# Patient Record
Sex: Male | Born: 1955 | Race: White | Hispanic: No | Marital: Married | Smoking: Former smoker
Health system: Southern US, Community
[De-identification: ages and names within clinical notes are randomized; demographics above are authoritative.]

## PROBLEM LIST (undated history)

## (undated) DIAGNOSIS — C641 Malignant neoplasm of right kidney, except renal pelvis: Secondary | ICD-10-CM

## (undated) DIAGNOSIS — I10 Essential (primary) hypertension: Secondary | ICD-10-CM

## (undated) DIAGNOSIS — E785 Hyperlipidemia, unspecified: Secondary | ICD-10-CM

## (undated) DIAGNOSIS — I214 Non-ST elevation (NSTEMI) myocardial infarction: Secondary | ICD-10-CM

## (undated) DIAGNOSIS — K219 Gastro-esophageal reflux disease without esophagitis: Secondary | ICD-10-CM

## (undated) DIAGNOSIS — M1990 Unspecified osteoarthritis, unspecified site: Secondary | ICD-10-CM

## (undated) DIAGNOSIS — I2510 Atherosclerotic heart disease of native coronary artery without angina pectoris: Secondary | ICD-10-CM

## (undated) HISTORY — DX: Essential (primary) hypertension: I10

## (undated) HISTORY — DX: Atherosclerotic heart disease of native coronary artery without angina pectoris: I25.10

## (undated) HISTORY — DX: Hyperlipidemia, unspecified: E78.5

## (undated) HISTORY — PX: BACK SURGERY: SHX140

## (undated) HISTORY — DX: Non-ST elevation (NSTEMI) myocardial infarction: I21.4

## (undated) HISTORY — DX: Malignant neoplasm of right kidney, except renal pelvis: C64.1

## (undated) HISTORY — PX: TONSILLECTOMY: SUR1361

---

## 2007-08-27 ENCOUNTER — Ambulatory Visit (HOSPITAL_COMMUNITY): Admission: RE | Admit: 2007-08-27 | Discharge: 2007-08-27 | Payer: Self-pay | Admitting: Unknown Physician Specialty

## 2011-11-20 ENCOUNTER — Ambulatory Visit (HOSPITAL_COMMUNITY)
Admission: RE | Admit: 2011-11-20 | Discharge: 2011-11-20 | Disposition: A | Discharge status: Home / Self Care | Source: Ambulatory Visit | Attending: Urology | Admitting: Urology

## 2011-11-20 ENCOUNTER — Other Ambulatory Visit (HOSPITAL_COMMUNITY): Payer: Self-pay | Admitting: Urology

## 2011-11-20 DIAGNOSIS — D4959 Neoplasm of unspecified behavior of other genitourinary organ: Secondary | ICD-10-CM | POA: Insufficient documentation

## 2011-11-20 DIAGNOSIS — I10 Essential (primary) hypertension: Secondary | ICD-10-CM | POA: Insufficient documentation

## 2011-11-25 ENCOUNTER — Other Ambulatory Visit: Payer: Self-pay | Admitting: Urology

## 2012-01-06 ENCOUNTER — Encounter (HOSPITAL_COMMUNITY): Payer: Self-pay | Admitting: Pharmacy Technician

## 2012-01-08 ENCOUNTER — Encounter (HOSPITAL_COMMUNITY): Payer: Self-pay

## 2012-01-08 ENCOUNTER — Encounter (HOSPITAL_COMMUNITY)
Admission: RE | Admit: 2012-01-08 | Discharge: 2012-01-08 | Disposition: A | Discharge status: Home / Self Care | Source: Ambulatory Visit | Attending: Urology | Admitting: Urology

## 2012-01-08 HISTORY — DX: Unspecified osteoarthritis, unspecified site: M19.90

## 2012-01-08 HISTORY — DX: Gastro-esophageal reflux disease without esophagitis: K21.9

## 2012-01-08 LAB — BASIC METABOLIC PANEL
CO2: 26 mEq/L (ref 19–32)
Chloride: 102 mEq/L (ref 96–112)
Creatinine, Ser: 0.74 mg/dL (ref 0.50–1.35)
Glucose, Bld: 90 mg/dL (ref 70–99)
Potassium: 4.2 mEq/L (ref 3.5–5.1)

## 2012-01-08 LAB — CBC
MCHC: 34.1 g/dL (ref 30.0–36.0)
Platelets: 213 10*3/uL (ref 150–400)
RBC: 4.77 MIL/uL (ref 4.22–5.81)
RDW: 13.7 % (ref 11.5–15.5)
WBC: 8.3 10*3/uL (ref 4.0–10.5)

## 2012-01-08 LAB — SURGICAL PCR SCREEN: MRSA, PCR: NEGATIVE

## 2012-01-08 MED ORDER — CEFAZOLIN SODIUM 1-5 GM-% IV SOLN: 1.0000 g | INTRAVENOUS | Status: DC

## 2012-01-08 NOTE — Patient Instructions (Signed)
[address] MIA WINTHROP  01/08/2012   Your procedure is scheduled on:  01/14/12 [id number]  Report to [facility] at 0515 AM.  Call this number if you have problems the morning of surgery: [phone]   Remember:   Do not eat food:After Midnight.  May have clear liquids:until Midnight .  Marland Kitchen  Take these medicines the morning of surgery with A SIP OF WATER:    Do not wear jewelry,   Do not wear lotions, powders, or perfumes.   .  Do not bring valuables to the hospital.  Contacts, dentures or bridgework may not be worn into surgery.  Leave suitcase in the car. After surgery it may be brought to your room.  For patients admitted to the hospital, checkout time is 11:00 AM the day of discharge.      Special Instructions: CHG Shower Use Special Wash: 1/2 bottle night before surgery and 1/2 bottle morning of surgery. Shower chin to toes with CHG.  Wash face and private parts with regular soap.     Please read over the following fact sheets that you were given: MRSA Information, coughing and deep breathing exercises, leg exercises

## 2012-01-14 ENCOUNTER — Inpatient Hospital Stay (HOSPITAL_COMMUNITY): Admitting: Certified Registered Nurse Anesthetist

## 2012-01-14 ENCOUNTER — Encounter (HOSPITAL_COMMUNITY)
Admission: RE | Disposition: A | Discharge status: Home / Self Care | Payer: Self-pay | Source: Ambulatory Visit | Attending: Urology

## 2012-01-14 ENCOUNTER — Inpatient Hospital Stay (HOSPITAL_COMMUNITY)
Admission: RE | Admit: 2012-01-14 | Discharge: 2012-01-19 | DRG: 658 | Disposition: A | Discharge status: Home / Self Care | Source: Ambulatory Visit | Attending: Urology | Admitting: Urology

## 2012-01-14 ENCOUNTER — Encounter (HOSPITAL_COMMUNITY): Payer: Self-pay

## 2012-01-14 ENCOUNTER — Encounter (HOSPITAL_COMMUNITY): Payer: Self-pay | Admitting: Certified Registered Nurse Anesthetist

## 2012-01-14 ENCOUNTER — Inpatient Hospital Stay (HOSPITAL_COMMUNITY)

## 2012-01-14 DIAGNOSIS — Z01812 Encounter for preprocedural laboratory examination: Secondary | ICD-10-CM

## 2012-01-14 DIAGNOSIS — I10 Essential (primary) hypertension: Secondary | ICD-10-CM | POA: Diagnosis present

## 2012-01-14 DIAGNOSIS — K047 Periapical abscess without sinus: Secondary | ICD-10-CM | POA: Diagnosis present

## 2012-01-14 DIAGNOSIS — C649 Malignant neoplasm of unspecified kidney, except renal pelvis: Principal | ICD-10-CM | POA: Diagnosis present

## 2012-01-14 DIAGNOSIS — K219 Gastro-esophageal reflux disease without esophagitis: Secondary | ICD-10-CM | POA: Diagnosis present

## 2012-01-14 HISTORY — PX: ROBOT ASSISTED LAPAROSCOPIC NEPHRECTOMY: SHX5140

## 2012-01-14 LAB — TYPE AND SCREEN: Antibody Screen: NEGATIVE

## 2012-01-14 LAB — BASIC METABOLIC PANEL
BUN: 13 mg/dL (ref 6–23)
Calcium: 8.9 mg/dL (ref 8.4–10.5)
Creatinine, Ser: 0.96 mg/dL (ref 0.50–1.35)
GFR calc Af Amer: >90 mL/min (ref >90)
GFR calc non Af Amer: >90 mL/min (ref >90)

## 2012-01-14 LAB — ABO/RH: ABO/RH(D): A POS

## 2012-01-14 SURGERY — ROBOTIC ASSISTED LAPAROSCOPIC NEPHRECTOMY
Anesthesia: General | Site: Bladder | Laterality: Right | Wound class: Clean

## 2012-01-14 MED ORDER — GLYCOPYRROLATE 0.2 MG/ML IJ SOLN
INTRAMUSCULAR | Status: DC | PRN
  Administered 2012-01-14: 0.6 mg via INTRAVENOUS

## 2012-01-14 MED ORDER — ACETAMINOPHEN 10 MG/ML IV SOLN
1000.0000 mg | Freq: Four times a day (QID) | INTRAVENOUS | Status: DC
  Administered 2012-01-14: 1000 mg via INTRAVENOUS

## 2012-01-14 MED ORDER — KETOROLAC TROMETHAMINE 30 MG/ML IJ SOLN: 15.0000 mg | Freq: Once | INTRAMUSCULAR | Status: DC | PRN

## 2012-01-14 MED ORDER — PROPOFOL 10 MG/ML IV BOLUS
INTRAVENOUS | Status: DC | PRN
  Administered 2012-01-14: 200 mg via INTRAVENOUS

## 2012-01-14 MED ORDER — HYDRALAZINE HCL 20 MG/ML IJ SOLN
INTRAMUSCULAR | Status: DC | PRN
  Administered 2012-01-14 (×2): 4 mg via INTRAVENOUS

## 2012-01-14 MED ORDER — CEFAZOLIN SODIUM 1-5 GM-% IV SOLN
1.0000 g | Freq: Three times a day (TID) | INTRAVENOUS | Status: AC
  Administered 2012-01-14 – 2012-01-15 (×2): 1 g via INTRAVENOUS
  Filled 2012-01-14 (×2): qty 50

## 2012-01-14 MED ORDER — LIDOCAINE HCL (CARDIAC) 20 MG/ML IV SOLN
INTRAVENOUS | Status: DC | PRN
  Administered 2012-01-14: 60 mg via INTRAVENOUS

## 2012-01-14 MED ORDER — LACTATED RINGERS IV SOLN
INTRAVENOUS | Status: DC | PRN
  Administered 2012-01-14 (×4): via INTRAVENOUS

## 2012-01-14 MED ORDER — SODIUM CHLORIDE 0.9 % IV BOLUS (SEPSIS)
1000.0000 mL | Freq: Once | INTRAVENOUS | Status: AC
  Administered 2012-01-14: 1000 mL via INTRAVENOUS

## 2012-01-14 MED ORDER — DEXAMETHASONE SODIUM PHOSPHATE 10 MG/ML IJ SOLN
INTRAMUSCULAR | Status: DC | PRN
  Administered 2012-01-14: 10 mg via INTRAVENOUS

## 2012-01-14 MED ORDER — NEOSTIGMINE METHYLSULFATE 1 MG/ML IJ SOLN
INTRAMUSCULAR | Status: DC | PRN
  Administered 2012-01-14: 4 mg via INTRAVENOUS

## 2012-01-14 MED ORDER — ACETAMINOPHEN 10 MG/ML IV SOLN
1000.0000 mg | Freq: Four times a day (QID) | INTRAVENOUS | Status: AC
  Administered 2012-01-14 – 2012-01-15 (×4): 1000 mg via INTRAVENOUS
  Filled 2012-01-14 (×4): qty 100

## 2012-01-14 MED ORDER — SUCCINYLCHOLINE CHLORIDE 20 MG/ML IJ SOLN
INTRAMUSCULAR | Status: DC | PRN
  Administered 2012-01-14: 100 mg via INTRAVENOUS

## 2012-01-14 MED ORDER — IOHEXOL 300 MG/ML  SOLN
INTRAMUSCULAR | Status: DC | PRN
  Administered 2012-01-14: 6 mL

## 2012-01-14 MED ORDER — PROMETHAZINE HCL 25 MG/ML IJ SOLN: 6.2500 mg | INTRAMUSCULAR | Status: DC | PRN

## 2012-01-14 MED ORDER — KCL IN DEXTROSE-NACL 10-5-0.45 MEQ/L-%-% IV SOLN
INTRAVENOUS | Status: DC
  Administered 2012-01-14: 1000 mL via INTRAVENOUS
  Filled 2012-01-14 (×3): qty 1000

## 2012-01-14 MED ORDER — ONDANSETRON HCL 4 MG/2ML IJ SOLN
INTRAMUSCULAR | Status: DC | PRN
  Administered 2012-01-14: 4 mg via INTRAVENOUS

## 2012-01-14 MED ORDER — HYOSCYAMINE SULFATE 0.125 MG PO TABS
0.1250 mg | ORAL_TABLET | ORAL | Status: DC | PRN
  Filled 2012-01-14: qty 1

## 2012-01-14 MED ORDER — HYDROCODONE-ACETAMINOPHEN 5-325 MG PO TABS
1.0000 | ORAL_TABLET | ORAL | Status: DC | PRN
  Administered 2012-01-16 – 2012-01-19 (×7): 2 via ORAL
  Filled 2012-01-14 (×7): qty 2

## 2012-01-14 MED ORDER — LABETALOL HCL 5 MG/ML IV SOLN
5.0000 mg | INTRAVENOUS | Status: DC | PRN
  Administered 2012-01-14 (×3): 5 mg via INTRAVENOUS
  Filled 2012-01-14: qty 4

## 2012-01-14 MED ORDER — HYDROMORPHONE HCL PF 1 MG/ML IJ SOLN
0.2500 mg | INTRAMUSCULAR | Status: DC | PRN
  Administered 2012-01-14 (×2): 0.25 mg via INTRAVENOUS
  Administered 2012-01-14: 0.5 mg via INTRAVENOUS
  Administered 2012-01-14: 0.25 mg via INTRAVENOUS
  Administered 2012-01-14: 0.5 mg via INTRAVENOUS

## 2012-01-14 MED ORDER — HYDROMORPHONE HCL PF 1 MG/ML IJ SOLN
INTRAMUSCULAR | Status: DC | PRN
  Administered 2012-01-14 (×2): 1 mg via INTRAVENOUS

## 2012-01-14 MED ORDER — MIDAZOLAM HCL 5 MG/5ML IJ SOLN
INTRAMUSCULAR | Status: DC | PRN
  Administered 2012-01-14: 2 mg via INTRAVENOUS

## 2012-01-14 MED ORDER — EPHEDRINE SULFATE 50 MG/ML IJ SOLN
INTRAMUSCULAR | Status: DC | PRN
  Administered 2012-01-14: 10 mg via INTRAVENOUS

## 2012-01-14 MED ORDER — BELLADONNA ALKALOIDS-OPIUM 16.2-60 MG RE SUPP
1.0000 | Freq: Four times a day (QID) | RECTAL | Status: DC | PRN
  Administered 2012-01-14: 1 via RECTAL

## 2012-01-14 MED ORDER — BUPIVACAINE LIPOSOME 1.3 % IJ SUSP
20.0000 mL | Freq: Once | INTRAMUSCULAR | Status: AC
  Administered 2012-01-14: 30 mL
  Filled 2012-01-14: qty 20

## 2012-01-14 MED ORDER — CEFAZOLIN SODIUM-DEXTROSE 2-3 GM-% IV SOLR
2.0000 g | Freq: Once | INTRAVENOUS | Status: AC
  Administered 2012-01-14 (×2): 2 g via INTRAVENOUS

## 2012-01-14 MED ORDER — PENICILLIN V POTASSIUM 500 MG PO TABS
500.0000 mg | ORAL_TABLET | Freq: Four times a day (QID) | ORAL | Status: DC
  Administered 2012-01-15 – 2012-01-19 (×17): 500 mg via ORAL
  Filled 2012-01-14 (×21): qty 1

## 2012-01-14 MED ORDER — MANNITOL 25 % IV SOLN
INTRAVENOUS | Status: DC | PRN
  Administered 2012-01-14 (×2): 12.5 g via INTRAVENOUS

## 2012-01-14 MED ORDER — FENTANYL CITRATE 0.05 MG/ML IJ SOLN
INTRAMUSCULAR | Status: DC | PRN
  Administered 2012-01-14: 100 ug via INTRAVENOUS
  Administered 2012-01-14: 50 ug via INTRAVENOUS
  Administered 2012-01-14 (×3): 100 ug via INTRAVENOUS
  Administered 2012-01-14: 50 ug via INTRAVENOUS

## 2012-01-14 MED ORDER — MORPHINE SULFATE 2 MG/ML IJ SOLN
2.0000 mg | INTRAMUSCULAR | Status: DC | PRN
Start: 2012-01-14 — End: 2012-01-19
  Administered 2012-01-14 – 2012-01-16 (×13): 2 mg via INTRAVENOUS
  Filled 2012-01-14 (×14): qty 1

## 2012-01-14 MED ORDER — SODIUM CHLORIDE 0.9 % IV SOLN
INTRAVENOUS | Status: DC
  Administered 2012-01-14 – 2012-01-18 (×7): via INTRAVENOUS

## 2012-01-14 MED ORDER — LACTATED RINGERS IR SOLN
Status: DC | PRN
  Administered 2012-01-14: 1000 mL

## 2012-01-14 MED ORDER — CISATRACURIUM BESYLATE 2 MG/ML IV SOLN
INTRAVENOUS | Status: DC | PRN
  Administered 2012-01-14: 6 mg via INTRAVENOUS
  Administered 2012-01-14: 4 mg via INTRAVENOUS
  Administered 2012-01-14: 8 mg via INTRAVENOUS
  Administered 2012-01-14: 4 mg via INTRAVENOUS
  Administered 2012-01-14: 6 mg via INTRAVENOUS
  Administered 2012-01-14: 10 mg via INTRAVENOUS
  Administered 2012-01-14: 2 mg via INTRAVENOUS

## 2012-01-14 SURGICAL SUPPLY — 85 items
APPLICATOR SURGIFLO ENDO (HEMOSTASIS) ×3 IMPLANT
CATH FOLEY 2WAY SLVR  5CC 18FR (CATHETERS) ×2
CATH FOLEY 2WAY SLVR 5CC 18FR (CATHETERS) ×4 IMPLANT
CATH URET 5FR 28IN CONE TIP (BALLOONS) ×2
CATH URET 5FR 28IN OPEN ENDED (CATHETERS) ×3 IMPLANT
CATH URET 5FR 70CM CONE TIP (BALLOONS) ×4 IMPLANT
CHLORAPREP W/TINT 26ML (MISCELLANEOUS) ×3 IMPLANT
CLIP LIGATING HEM O LOK PURPLE (MISCELLANEOUS) ×3 IMPLANT
CLIP LIGATING HEMO O LOK GREEN (MISCELLANEOUS) ×6 IMPLANT
CLIP SUT LAPRA TY ABSORB (SUTURE) IMPLANT
CLOTH BEACON ORANGE TIMEOUT ST (SAFETY) ×3 IMPLANT
CORDS BIPOLAR (ELECTRODE) ×6 IMPLANT
COVER SURGICAL LIGHT HANDLE (MISCELLANEOUS) ×3 IMPLANT
COVER TIP SHEARS 8 DVNC (MISCELLANEOUS) ×2 IMPLANT
COVER TIP SHEARS 8MM DA VINCI (MISCELLANEOUS) ×1
DECANTER SPIKE VIAL GLASS SM (MISCELLANEOUS) IMPLANT
DERMABOND ADVANCED (GAUZE/BANDAGES/DRESSINGS) ×1
DERMABOND ADVANCED .7 DNX12 (GAUZE/BANDAGES/DRESSINGS) ×2 IMPLANT
DRAIN CHANNEL 15F RND FF 3/16 (WOUND CARE) ×3 IMPLANT
DRAPE CAMERA HEAD DAVINCI SI (DRAPES) ×3 IMPLANT
DRAPE INCISE IOBAN 66X45 STRL (DRAPES) ×9 IMPLANT
DRAPE INSTRUMENT ARM DA VINCI (DRAPES) ×1
DRAPE INSTRUMENT ARM DVNC (DRAPES) ×2 IMPLANT
DRAPE LAPAROSCOPIC ABDOMINAL (DRAPES) ×3 IMPLANT
DRAPE LG THREE QUARTER DISP (DRAPES) ×6 IMPLANT
DRAPE TABLE BACK 44X90 PK DISP (DRAPES) ×3 IMPLANT
DRAPE WARM FLUID 44X44 (DRAPE) ×3 IMPLANT
DRSG TEGADERM 6X8 (GAUZE/BANDAGES/DRESSINGS) ×3 IMPLANT
ELECT REM PT RETURN 9FT ADLT (ELECTROSURGICAL) ×6
ELECTRODE REM PT RTRN 9FT ADLT (ELECTROSURGICAL) ×4 IMPLANT
EVACUATOR SILICONE 100CC (DRAIN) ×3 IMPLANT
FLOSEAL 10ML (HEMOSTASIS) ×3 IMPLANT
GAUZE VASELINE 3X9 (GAUZE/BANDAGES/DRESSINGS) ×3 IMPLANT
GLOVE BIO SURGEON STRL SZ 6.5 (GLOVE) ×3 IMPLANT
GLOVE BIOGEL PI IND STRL 7.5 (GLOVE) ×6 IMPLANT
GLOVE BIOGEL PI INDICATOR 7.5 (GLOVE) ×3
GLOVE ECLIPSE 7.0 STRL STRAW (GLOVE) ×6 IMPLANT
GLOVE ECLIPSE 7.5 STRL STRAW (GLOVE) ×3 IMPLANT
GOWN PREVENTION PLUS XLARGE (GOWN DISPOSABLE) ×3 IMPLANT
GOWN STRL NON-REIN LRG LVL3 (GOWN DISPOSABLE) ×9 IMPLANT
GUIDEWIRE STR DUAL SENSOR (WIRE) ×3 IMPLANT
HEMOSTAT SURGICEL 4X8 (HEMOSTASIS) ×3 IMPLANT
KIT ACCESSORY DA VINCI DISP (KITS) ×1
KIT ACCESSORY DVNC DISP (KITS) ×2 IMPLANT
KIT BASIN OR (CUSTOM PROCEDURE TRAY) ×3 IMPLANT
NEEDLE INSUFFLATION 14GA 150MM (NEEDLE) ×3 IMPLANT
NS IRRIG 1000ML POUR BTL (IV SOLUTION) ×3 IMPLANT
PACK CYSTO (CUSTOM PROCEDURE TRAY) ×3 IMPLANT
PEN SKIN MARKING BROAD (MISCELLANEOUS) ×3 IMPLANT
PENCIL BUTTON HOLSTER BLD 10FT (ELECTRODE) ×3 IMPLANT
POSITIONER SURGICAL ARM (MISCELLANEOUS) IMPLANT
POUCH SPECIMEN RETRIEVAL 10MM (ENDOMECHANICALS) IMPLANT
SCISSORS LAP 5X35 DISP (ENDOMECHANICALS) ×3 IMPLANT
SET TUBE IRRIG SUCTION NO TIP (IRRIGATION / IRRIGATOR) ×3 IMPLANT
SOLUTION ANTI FOG 6CC (MISCELLANEOUS) ×3 IMPLANT
SOLUTION ELECTROLUBE (MISCELLANEOUS) ×3 IMPLANT
SPONGE LAP 18X18 X RAY DECT (DISPOSABLE) IMPLANT
STAPLER VISISTAT 35W (STAPLE) ×3 IMPLANT
STENT CONTOUR 6FRX24X.038 (STENTS) ×3 IMPLANT
SURGIFLO W/THROMBIN 8M KIT (HEMOSTASIS) IMPLANT
SUT ETHILON 3 0 PS 1 (SUTURE) ×3 IMPLANT
SUT MNCRL AB 4-0 PS2 18 (SUTURE) ×6 IMPLANT
SUT MON AB 2-0 SH 27 (SUTURE) IMPLANT
SUT MON AB 2-0 SH27 (SUTURE) IMPLANT
SUT V-LOC BARB 180 2/0GR9 GS23 (SUTURE) ×6
SUT VIC AB 0 CT1 27 (SUTURE) ×1
SUT VIC AB 0 CT1 27XBRD ANTBC (SUTURE) ×2 IMPLANT
SUT VIC AB 0 UR5 27 (SUTURE) IMPLANT
SUT VIC AB 2-0 SH 27 (SUTURE)
SUT VIC AB 2-0 SH 27X BRD (SUTURE) IMPLANT
SUT VIC AB 3-0 SH 27 (SUTURE) ×5
SUT VIC AB 3-0 SH 27X BRD (SUTURE) ×10 IMPLANT
SUT VIC AB 4-0 RB1 27 (SUTURE)
SUT VIC AB 4-0 RB1 27XBRD (SUTURE) IMPLANT
SUT VICRYL 0 UR6 27IN ABS (SUTURE) ×12 IMPLANT
SUTURE V-LC BRB 180 2/0GR9GS23 (SUTURE) ×4 IMPLANT
SYR BULB IRRIGATION 50ML (SYRINGE) IMPLANT
TRAY FOLEY CATH 14FRSI W/METER (CATHETERS) ×3 IMPLANT
TRAY LAP CHOLE (CUSTOM PROCEDURE TRAY) ×3 IMPLANT
TROCAR BLADELESS OPT 5 75 (ENDOMECHANICALS) ×3 IMPLANT
TROCAR ENDOPATH XCEL 12X100 BL (ENDOMECHANICALS) ×3 IMPLANT
TROCAR XCEL 12X100 BLDLESS (ENDOMECHANICALS) ×3 IMPLANT
TUBING INSUFFLATION 10FT LAP (TUBING) ×3 IMPLANT
V-LOC 180 ABSORBABLE WOUND CLOSURE DEVICE ×3 IMPLANT
WATER STERILE IRR 1500ML POUR (IV SOLUTION) ×6 IMPLANT

## 2012-01-14 NOTE — Anesthesia Postprocedure Evaluation (Signed)
  Anesthesia Post-op Note  Patient: [name]  Procedure(s) Performed: Procedure(s) (LRB): ROBOTIC ASSISTED LAPAROSCOPIC NEPHRECTOMY (Right) CYSTOSCOPY WITH STENT PLACEMENT (Right)  Patient Location: PACU  Anesthesia Type: General  Level of Consciousness: awake and alert   Airway and Oxygen Therapy: Patient Spontanous Breathing  Post-op Pain: mild  Post-op Assessment: Post-op Vital signs reviewed, Patient's Cardiovascular Status Stable, Respiratory Function Stable, Patent Airway and No signs of Nausea or vomiting  Post-op Vital Signs: stable  Complications: No apparent anesthesia complications

## 2012-01-14 NOTE — Transfer of Care (Signed)
Immediate Anesthesia Transfer of Care Note  Patient: [name]  Procedure(s) Performed: Procedure(s) (LRB): ROBOTIC ASSISTED LAPAROSCOPIC NEPHRECTOMY (Right) CYSTOSCOPY WITH STENT PLACEMENT (Right)  Patient Location: PACU  Anesthesia Type: General  Level of Consciousness: awake and alert   Airway & Oxygen Therapy: Patient Spontanous Breathing and Patient connected to face mask oxygen  Post-op Assessment: Report given to PACU RN and Post -op Vital signs reviewed and stable  Post vital signs: Reviewed and stable  Complications: No apparent anesthesia complications

## 2012-01-14 NOTE — Brief Op Note (Signed)
01/14/2012  2:38 PM  PATIENT:  [name]  56 y.o. male  PRE-OPERATIVE DIAGNOSIS:  Right Renal Mass  POST-OPERATIVE DIAGNOSIS:  Right Renal Cell Carcinoma  PROCEDURE:  Procedure(s) (LRB): ROBOTIC ASSISTED LAPAROSCOPIC Right Partial NEPHRECTOMY CYSTOSCOPY  Right ureter stent PLACEMENT Right retrograde pyelogram  SURGEON:  Surgeon(s) and Role:    * Milford Cage, MD - Primary  PHYSICIAN ASSISTANT:   ASSISTANTS: Pecola Leisure, PA   ANESTHESIA:   general  EBL:  Total I/O In: 3000 [I.V.:3000] Out: 175 [Blood:175]  BLOOD ADMINISTERED:none  DRAINS: (1) Jackson-Pratt drain(s) with closed bulb suction in the RLQ and Urinary Catheter (Foley)   LOCAL MEDICATIONS USED:  OTHER Exparel injection  SPECIMEN:  Source of Specimen:  right kidney mass  DISPOSITION OF SPECIMEN:  PATHOLOGY  COUNTS:  YES  TOURNIQUET:  * No tourniquets in log *  DICTATION: .Other Dictation: Dictation Number [id number]  PLAN OF CARE: Admit to inpatient   PATIENT DISPOSITION:  PACU - hemodynamically stable.   Delay start of Pharmacological VTE agent (>24hrs) due to surgical blood loss or risk of bleeding: yes

## 2012-01-14 NOTE — Progress Notes (Signed)
PT Spoke w Dr Margarita Grizzle Caleen Essex about tooth infection and he stated as long as afebrile was not a concern

## 2012-01-14 NOTE — H&P (Signed)
Chief Complaint  Right renal mass   History of Present Illness             56 year old male with incidental finding of right renal mass on abdominal US for left abdominal pain.  Further work up with MRI on 10/30/11 and CT scan without and with contrast revealed a  right upper pole renal mass measuring  2.4 x 2.7 x 3.1. There was no LAD or veinous involvement. Baseline serum creatinine is 0.85.  We discussed management options along and he presents today for robotic-assisted laparoscopic right partial nephrectomy.  We have discussed the risks, benefits, alternatives, and likelihood of achieving his goals.  He developed a dental abscess in a right upper molar on 01/09/12 and was started on penicillin.  He has had no pain since that time and no fever.     Past Medical History Problems  1. History of  Arthritis V13.4  Surgical History Problems  1. History of  Back Surgery  Current Meds 1. Tamsulosin HCl 0.4 MG Oral Capsule; Take one capsule daily; Therapy: 14Dec2012 to  (Evaluate:09Dec2013)  Requested for: 14Dec2012; Last Rx:14Dec2012  Allergies Medication  1. No Known Drug Allergies  Family History Problems  1. Family history of  Death In The Family Father 2. Family history of  Family Health Status Number Of Children 1 son  1 daughter  Social History Problems    Caffeine Use 5 a day   Marital History - Currently Married   Occupation: Maintenance   Tobacco Use 305.1 smoked x 5 years Denied    History of  Alcohol Use  Review of Systems Constitutional, skin, eye, otolaryngeal, hematologic/lymphatic, cardiovascular, pulmonary, endocrine, musculoskeletal, gastrointestinal, neurological and psychiatric system(s) were reviewed and pertinent findings if present are noted.     Physical Exam Constitutional: Well nourished and well developed.  ENT:. The ears and nose are normal in appearance. The oropharynx is normal.  Neck: The appearance of the neck is normal and no neck  mass is present.  Pulmonary: No respiratory distress, normal respiratory rhythm and effort and clear bilateral breath sounds.  Cardiovascular: Heart rate and rhythm are normal . No peripheral edema.  Abdomen: The abdomen is soft and nontender. The abdomen is no rebound. No CVA tenderness.  Lymphatics: The posterior cervical and supraclavicular nodes are not enlarged or tender.  Skin: Normal skin turgor and no visible rash.  Neuro/Psych:. Mood and affect are appropriate. No focal sensory deficits.    Assessment Right Renal Mass  Plan To OR for  robotic-assisted laparoscopic right partial nephrectomy.

## 2012-01-14 NOTE — Anesthesia Preprocedure Evaluation (Signed)
Anesthesia Evaluation  Patient identified by MRN, date of birth, ID band Patient awake    Reviewed: Allergy & Precautions, H&P , NPO status , Patient's Chart, lab work & pertinent test results  Airway Mallampati: II TM Distance: >3 FB Neck ROM: Full    Dental No notable dental hx.    Pulmonary neg pulmonary ROS,  clear to auscultation  Pulmonary exam normal       Cardiovascular neg cardio ROS Regular Normal    Neuro/Psych Negative Neurological ROS  Negative Psych ROS   GI/Hepatic Neg liver ROS, GERD-  Medicated,  Endo/Other  Negative Endocrine ROS  Renal/GU negative Renal ROS  Genitourinary negative   Musculoskeletal negative musculoskeletal ROS (+)   Abdominal   Peds negative pediatric ROS (+)  Hematology negative hematology ROS (+)   Anesthesia Other Findings   Reproductive/Obstetrics negative OB ROS                           Anesthesia Physical Anesthesia Plan  ASA: II  Anesthesia Plan: General   Post-op Pain Management:    Induction: Intravenous  Airway Management Planned: Oral ETT  Additional Equipment:   Intra-op Plan:   Post-operative Plan: Extubation in OR  Informed Consent: I have reviewed the patients History and Physical, chart, labs and discussed the procedure including the risks, benefits and alternatives for the proposed anesthesia with the patient or authorized representative who has indicated his/her understanding and acceptance.   Dental advisory given  Plan Discussed with: CRNA  Anesthesia Plan Comments:         Anesthesia Quick Evaluation  

## 2012-01-15 LAB — BASIC METABOLIC PANEL
BUN: 13 mg/dL (ref 6–23)
Calcium: 8.7 mg/dL (ref 8.4–10.5)
GFR calc non Af Amer: 65 mL/min — ABNORMAL LOW (ref >90)
Glucose, Bld: 136 mg/dL — ABNORMAL HIGH (ref 70–99)

## 2012-01-15 LAB — HEMOGLOBIN AND HEMATOCRIT, BLOOD: Hemoglobin: 14.4 g/dL (ref 13.0–17.0)

## 2012-01-15 MED ORDER — HEPARIN SODIUM (PORCINE) 5000 UNIT/ML IJ SOLN
5000.0000 [IU] | Freq: Two times a day (BID) | INTRAMUSCULAR | Status: DC
  Administered 2012-01-15 – 2012-01-18 (×7): 5000 [IU] via SUBCUTANEOUS
  Filled 2012-01-15 (×10): qty 1

## 2012-01-15 MED ORDER — METOPROLOL TARTRATE 1 MG/ML IV SOLN
5.0000 mg | Freq: Four times a day (QID) | INTRAVENOUS | Status: DC | PRN
  Administered 2012-01-15 – 2012-01-16 (×3): 5 mg via INTRAVENOUS
  Filled 2012-01-15 (×3): qty 5

## 2012-01-15 MED ORDER — AMLODIPINE BESYLATE 5 MG PO TABS
5.0000 mg | ORAL_TABLET | Freq: Every day | ORAL | Status: DC
  Administered 2012-01-15 – 2012-01-16 (×2): 5 mg via ORAL
  Filled 2012-01-15 (×3): qty 1

## 2012-01-15 MED ORDER — ONDANSETRON HCL 4 MG/2ML IJ SOLN: 4.0000 mg | INTRAMUSCULAR | Status: DC | PRN

## 2012-01-15 MED ORDER — BISACODYL 10 MG RE SUPP
10.0000 mg | Freq: Two times a day (BID) | RECTAL | Status: DC
  Administered 2012-01-15 – 2012-01-17 (×3): 10 mg via RECTAL
  Filled 2012-01-15 (×6): qty 1

## 2012-01-15 MED FILL — Sodium Chloride Inj 0.9%: INTRAMUSCULAR | Qty: 20 | Status: AC

## 2012-01-15 NOTE — Progress Notes (Addendum)
GU  Pain controlled.  Ambulated today to bathroom several times.  Positive flatus and BM.  Filed Vitals:   01/15/12 1528  BP: 145/87  Pulse: 61  Temp: 99.7 F (37.6 C)  Resp: 16    Abd: soft, appropriately TTP GU: urine mostly clear and some pink tinged.  Assessment/Plan: POD#1 right partial nephrectomy -Await results of post-ambulation H&H -Add Norvasc for HTN control -Clear liquid diet.    ADDENDUM: Hgb stable at 14.4. Called patient and discussed risks/benefits of heparin prophylaxis.  I have recommended starting prophylaxis tonight; he is in agreement.

## 2012-01-15 NOTE — Progress Notes (Signed)
Ambulated patient x2 to the bathroom and back.  Patient tolerated fair.  Pt. C/o pain at this time. BP also slightly elevated, gave prn Metoprolol.  Will ambulate in hall when BP/pain improve.  Emmely Bittinger, Joslyn Devon

## 2012-01-15 NOTE — Plan of Care (Signed)
Problem: Phase I Progression Outcomes Goal: Initial discharge plan identified Outcome: Completed/Met Date Met:  01/15/12 Pt to return home

## 2012-01-15 NOTE — Op Note (Signed)
NAMETREJUAN, MATHERNE NO.:  [id number]  MEDICAL RECORD NO.:  [id number]  LOCATION:  1403                         FACILITY:  [facility]  PHYSICIAN:  Natalia Leatherwood, MD    DATE OF BIRTH:  Aug 10, 1956  DATE OF PROCEDURE:  01/14/2012 DATE OF DISCHARGE:                              OPERATIVE REPORT   SURGEON:  Natalia Leatherwood, MD.  ASSISTANT:  Gwenlyn Fudge, physician's assistant.  PREOPERATIVE DIAGNOSIS:  Right renal mass.  POSTOPERATIVE DIAGNOSIS:  Right renal cell carcinoma.  PROCEDURES PERFORMED: 1. Robotic-assisted laparoscopic right partial nephrectomy. 2. Cystoscopy. 3. Right retrograde pyelogram. 4. Right ureteral stent placement.  FINDINGS:  Multiple veins emanating from the inferior vena cava supplying the right kidney.  What appears to be a tumor thrombus associated with the renal mass, and a large defect in the collecting system.  SPECIMENS:  Two frozen specimens were sent, one was the edge of the thrombus, which ended up being renal cell carcinoma, and the other was the deep margin which was deep to this, which was negative for malignancy, and then a final specimen was sent for permanent pathology, which was the renal mass itself.  COMPLICATIONS:  None.  DRAINS:  Jackson-Pratt drain in the right lower quadrant, a Foley catheter.  ESTIMATED BLOOD LOSS:  200 cc.  HISTORY OF PRESENT ILLNESS:  A 56 year old gentleman, who had an incidentally discovered right renal mass.  He presented in after discussion of treatment options.  He elected to have a right laparoscopic robotic-assisted partial nephrectomy.  He presents today for that procedure.  DESCRIPTION OF PROCEDURE:  Informed consent was obtained.  The patient was taken to the operating room, where he was placed in supine position. IV antibiotics were infused and general anesthesia was induced.  Time- out was performed in which the correct patient, surgical site, and procedure were  identified and agreed upon by the team.  Following this, a Foley catheter was placed using sterile technique, into his bladder. Then, he was placed on a bean bag, and he was placed in lateral decubitus position with the right side up.  Axillary roll was used to pad the axilla, make sure it was free of pressure and then the arms were supported with an armboard and the legs were supported with Gel-Foam anatomically.  All neurovascular pressure points were accounted for and padded appropriately.  He was then secured into place after flexion was placed into the table.  The bean bag was desufflated and he was taped into place.  The hair was removed from his abdomen and right flank, and then his abdomen and right flank were prepared and draped in the usual sterile fashion.  Following this, a Veress needle was placed 2 fingerbreadths below the right costal margin in the mid-clavicular line, and then the abdomen was insufflated.  There was low initial pressures and his abdomen insufflated symmetrically.  After this was done, a 10 mm camera port was placed lateral to the rectus muscle even with the umbilicus.  Next, the camera was placed.  It was noted that some of the mesentery did have some air present; however, there was no bowel injury or injury to the liver or  other intra-abdominal organs after inspection.  There were adhesions in the right side of the abdomen.  These were taken down with sharp dissection with laparoscopic scissors.  After this was done, the robotic ports were placed by triangulating the #1 and #3 arm, the #1 arm being in the right upper quadrant; #3 arm being in the right lateral side of the abdomen, and then following this, the 4th arm of the robot was placed just superior and medial to the anterior-superior iliac spine.  All these were placed under direct visualization.  An Exparel was injected into each of these areas.  After these were placed, the robot was docked from  behind the patient and then the procedure was begun.  The white line of Toldt was identified and reflected medially.  Dissection was then carried out inferior to the kidney until the psoas muscle was encountered.  Following this, the right ureter and right gonadal vein were identified and these were dissected superiorly until the vein was found to be inserting into the inferior vena cava.  It was noted on the CT scan, the patient did have two right renal veins.  Further dissection was carried out over the hilum and it was noted that the patient had multiple small veins emanating directly from the IVC to the right kidney.  These were preserved, but dissection was very slow because of these.  Finally, the main superior and inferior renal vein were identified as well as the right renal artery.  After this was done, Gerota's fascia was removed off the kidney and the mass was identified.  It was an exophytic mass.  The edges of the mass were marked.  12.5 g of mannitol were administered to the patient intravenously and then bulldog clamp was placed on the right renal artery. Dissection was carried out to remove the mass.  In the deepest part of the mass, it was noted that he had a deeper segment of what appeared to be cancer or a thrombus.  The entire mass was removed and placed into an Endocatch bag.  The tip of this area was removed to see if this was a tumor thrombus versus bland thrombus and sent for frozen section pathology and then a section of deeper tissue that was below this deepest part of the tumor was also sent for frozen pathology. There was a large defect noted in the collecting system, and this was oversewn with interrupted 3-0 Vicryl sutures.  The edges of the renal parenchyma were coagulated with monopolar scissors.Next, 3-0 V-Loc suture were used to run the interior portion of the kidney.  Interrupted 3-0 Vicryl sutures were also used to oversew the veins in the kidney  that were in size.  Because of such a large collecting defect, there was a longer clamping time than normally expected because there were multiple veins that had to be closed as well as a large defect in the collecting system.  After this, a 2-0 V-Loc suture with the ends tied together with the Hem-o-lok clamp were placed in horizontal mattress fashion to close the capsule. After all of this was done, the bulldog clamp was removed, and there was good hemostasis and no active bleeding.  After this was done, a FloSeal was placed over these areas.  There was an area on the inferior portion of the kidney that while dissecting, that caused a tear in the capsule and this was coagulated with an argon beam. The total clamp time was 66 minutes.  The patient  had another 12.5 g mannitol administered after unclamping. FloSeal was placed around the renal hilum and then packet of Surgicel was placed over the inferior portion of the defect and over the area where the capsule had been torn in the inferior portion of the kidney. Gerota's fascia was reapproximated with a Hem-o-lok clips over the kidney.  It should be noted that the right gonadal vein did have to be divided off the inferior vena cava to allow dissection.  This was accomplished with Hem-o-lok clips.  After that was done, the rest of the abdomen was inspected.  There were no areas of bleeding, hemorrhage, or injury.  The Jackson-Pratt drain was then placed in the 4th robotic arm port and held into place and then sutured into the skin.  The robot was undocked and then the camera port was closed with a suture passer under direct visualization laparoscopically and the fascia was closed with 0 Vicryl, and then all the ports were removed under direct visualization. There was no bleeding from the ports.  Next, the abdomen was desufflated, and then, the assistant port which had been placed in the midline superior to the umbilicus was slightly  enlarged to remove the specimen bag.  It should also be noted that a 5 mm assistant port have been placed between the 4th arm and the camera port under direct visualization upon initial setup.  After this was done, the fascia was closed with interrupted figure-of-eight 0 Vicryl sutures.  The wounds were all washed out with sterile normal saline and then injected with Exparel and closed with staples.  Tegaderm was placed over the drain. Next, it was felt since there was such a large defect in the collecting system, that it would be appropriate to place a right ureteral stent to prevent any urine leakage.  So, the patient was repositioned in a dorsal lithotomy position.  His genitals were prepped and draped in usual sterile fashion.  Time-out was performed, in which the correct patient, surgical site, and procedure were identified and agreed upon by the team.  Next, a rigid cystoscope was advanced through the urethra into the bladder.  The right ureteral orifice was identified.  It was cannulated with a sensor tip wire and then a 5-French open-ended Pollock catheter was placed over this.  Very gentle placement of contrast was placed over this into the proximal ureter just to identify the most distal aspect of the renal pelvis.  After this was done, the wire was placed up and then a 6 x 24 double-J stent without the strings in place was placed up over the wire under fluoroscopy.  The curl was placed into the lower renal pelvis and then the camera was removed.  An 18-French catheter was placed with 10 cc of sterile water in the balloon.  This completed the procedure.  The patient's anesthesia was reversed.  The patient was placed back in supine position and taken to the PACU in stable condition.  He will be kept on bedrest overnight in the hospital.          ______________________________ Natalia Leatherwood, MD     DW/MEDQ  D:  01/14/2012  T:  01/15/2012  Job:  [id number]

## 2012-01-15 NOTE — Progress Notes (Signed)
Urology Progress Note  Subjective:     No acute urologic events overnight. Remains on strict bedrest for 24 hours.  Complains of back pain, which is chronic, from lying in bed.  Pain well controlled.  No flatus or BM. Tolerating sips of clear liquids.  Objective:  Patient Vitals for the past 24 hrs:  BP Temp Temp src Pulse Resp SpO2 Height Weight  01/15/12 0459 131/83 mmHg 98.9 F (37.2 C) Oral 72  16  98 % - -  01/15/12 0212 151/78 mmHg 98.9 F (37.2 C) Oral 75  16  99 % - -  01/14/12 2103 154/78 mmHg 99.1 F (37.3 C) Oral 71  16  99 % - -  01/14/12 1754 116/69 mmHg 98.1 F (36.7 C) Oral 92  14  93 % 5\' 8"  (1.727 m) 86.183 kg (190 lb)  01/14/12 1715 168/95 mmHg 98 F (36.7 C) - 75  14  100 % - -  01/14/12 1700 - 99.9 F (37.7 C) - 71  12  100 % - -  01/14/12 1645 152/87 mmHg - - 69  14  100 % - -  01/14/12 1630 172/103 mmHg - - 74  12  99 % - -  01/14/12 1615 182/98 mmHg - - 91  9  97 % - -  01/14/12 1600 172/107 mmHg 97.4 F (36.3 C) - 92  14  99 % - -  01/14/12 1545 167/110 mmHg - - 87  11  100 % - -  01/14/12 1530 164/101 mmHg - - 94  16  100 % - -  01/14/12 1515 170/103 mmHg - - 90  11  100 % - -  01/14/12 1500 179/88 mmHg 97.4 F (36.3 C) - 101  18  100 % - -    Physical Exam: General:  No acute distress, awake Cardiovascular:    [x]   S1/S2 present, RRR  []   Irregularly irregular Chest:  CTA-B Abdomen:               []  Soft, appropriately TTP  []  Soft, NTTP  [x]  Soft, appropriately TTP, incision(s) dressed; JP serosanguinous.  Genitourinary: Foley in place Foley:  Draining light pink tinged urine.    I/O last 3 completed shifts: In: 4475 [I.V.:4375; IV Piggyback:100] Out: 755 [Urine:500; Drains:80; Blood:175]  UO: 2295 mL JP: 170 mL  Recent Labs  Basename 01/15/12 0454 01/14/12 1510   HGB 13.5 14.2   WBC -- --   PLT -- --    Recent Labs  Basename 01/15/12 0454 01/14/12 1510   NA 135 135   K 4.3 3.9   CL 102 99   CO2 25 22   BUN 13 13   CREATININE 1.22 0.96   CALCIUM 8.7 8.9   GFRNONAA 65* >90   GFRAA 75* >90     No results found for this basename: PT:2,INR:2,APTT:2 in the last 72 hours   No components found with this basename: ABG:2     Assessment: Right renal cell carcinoma. POD#1 Right robotic partial nephrectomy Plan: -Remain on bedrest for a full 24 hours; ambulate at 14:00 with H&H at 18:00 -Restart penicillin V for tooth abscess today. -Continue CLD -Continue foley catheter. -Still too high risk for bleed to start heparin; reassess risk after post-ambulation H&H.   Natalia Leatherwood, MD [phone]

## 2012-01-16 LAB — BASIC METABOLIC PANEL
BUN: 15 mg/dL (ref 6–23)
Chloride: 99 mEq/L (ref 96–112)
GFR calc Af Amer: 88 mL/min — ABNORMAL LOW (ref >90)
Potassium: 4.3 mEq/L (ref 3.5–5.1)

## 2012-01-16 LAB — CBC
HCT: 39.7 % (ref 39.0–52.0)
Hemoglobin: 13.5 g/dL (ref 13.0–17.0)
MCHC: 34 g/dL (ref 30.0–36.0)

## 2012-01-16 MED ORDER — METOPROLOL TARTRATE 25 MG PO TABS
25.0000 mg | ORAL_TABLET | Freq: Two times a day (BID) | ORAL | Status: DC
  Administered 2012-01-16 – 2012-01-17 (×3): 25 mg via ORAL
  Filled 2012-01-16 (×4): qty 1

## 2012-01-16 MED ORDER — AMLODIPINE BESYLATE 10 MG PO TABS
10.0000 mg | ORAL_TABLET | Freq: Every day | ORAL | Status: DC
  Administered 2012-01-17 – 2012-01-19 (×3): 10 mg via ORAL
  Filled 2012-01-16 (×3): qty 1

## 2012-01-16 NOTE — Progress Notes (Signed)
Recd pt condition stable. Initial am asses unchanged. Cont with plan of care

## 2012-01-16 NOTE — Progress Notes (Signed)
Assuming care of pt. initial asses unchanged. Cont with plan of care

## 2012-01-16 NOTE — Progress Notes (Signed)
GU  I reviewed the final pathology with the patient today and gave him a copy of the results: pT1a, renal cell carcinoma, clear cell, grade 3, negative margins.  Continues to have flatus, but no new BM's.  He refused suppository this morning; I advised he begin taking this.  Still has HTN; will increase norvasc to 10 mg qAM.

## 2012-01-16 NOTE — Progress Notes (Signed)
Urology Progress Note  Subjective:     No acute urologic events overnight. Positive flatus regularly. Had liquid BM yesterday; none other since that time. Pain well controlled.  Positive ambulation. Tolerating clear liquids. He would like to try regular diet at lunch.  Remains hypertensive.  Objective:  Patient Vitals for the past 24 hrs:  BP Temp Temp src Pulse Resp SpO2  01/16/12 0500 168/95 mmHg 98.4 F (36.9 C) Oral 82  18  95 %  01/15/12 2109 150/90 mmHg 98.8 F (37.1 C) Oral 78  18  97 %  01/15/12 1813 186/99 mmHg 99.1 F (37.3 C) Oral 80  16  96 %  01/15/12 1528 145/87 mmHg 99.7 F (37.6 C) Oral 61  16  98 %  01/15/12 1434 175/91 mmHg 98.1 F (36.7 C) Oral 65  16  99 %  01/15/12 1113 160/84 mmHg 98.7 F (37.1 C) Oral 74  16  100 %    Physical Exam: General:  No acute distress, awake Cardiovascular:    [x]   S1/S2 present, RRR  []   Irregularly irregular Chest:  CTA-B Abdomen:               []  Soft, appropriately TTP  []  Soft, NTTP  [x]  Soft, appropriately TTP, incision(s) clean, dry, & intact; JP serosanguinous.  Genitourinary: Foley in place Foley:  Draining light pink tinged urine.    I/O last 3 completed shifts: In: 6583.3 [P.O.:600; I.V.:5583.3; IV Piggyback:400] Out: 4113 [Urine:3620; Drains:318; Blood:175]  UO: 3025 mL JP: 168 mL  Recent Labs  Basename 01/16/12 0420 01/15/12 1800   HGB 13.5 14.4   WBC 15.2* --   PLT 193 --    Recent Labs  Basename 01/16/12 0420 01/15/12 0454   NA 133* 135   K 4.3 4.3   CL 99 102   CO2 23 25   BUN 15 13   CREATININE 1.07 1.22   CALCIUM 8.9 8.7   GFRNONAA 76* 65*   GFRAA 88* 75*     No results found for this basename: PT:2,INR:2,APTT:2 in the last 72 hours   No components found with this basename: ABG:2     Assessment: Right renal cell carcinoma. POD#2 Right robotic partial nephrectomy Plan: -Continue ambulation -Continue CLD; advance to regular diet for lunch -Continue foley catheter. -Heparin  started last night. -Add metoprolol 25 mg BID.    Natalia Leatherwood, MD [phone]

## 2012-01-17 LAB — BASIC METABOLIC PANEL
CO2: 23 mEq/L (ref 19–32)
Calcium: 8.8 mg/dL (ref 8.4–10.5)
Creatinine, Ser: 0.92 mg/dL (ref 0.50–1.35)
Glucose, Bld: 110 mg/dL — ABNORMAL HIGH (ref 70–99)
Sodium: 133 mEq/L — ABNORMAL LOW (ref 135–145)

## 2012-01-17 MED ORDER — METOPROLOL TARTRATE 50 MG PO TABS
50.0000 mg | ORAL_TABLET | Freq: Two times a day (BID) | ORAL | Status: DC
  Administered 2012-01-17 – 2012-01-19 (×4): 50 mg via ORAL
  Filled 2012-01-17 (×5): qty 1

## 2012-01-17 NOTE — Progress Notes (Signed)
Urology Progress Note  Subjective:     No acute urologic events overnight. Positive flatus regularly. Two BM (last night and this AM). Pain well controlled.  Positive ambulation. Tolerating clear liquids. Not eating much of regular diet.  Remains hypertensive.  Objective:  Patient Vitals for the past 24 hrs:  BP Temp Temp src Pulse Resp SpO2  01/17/12 0952 170/92 mmHg - - 66  - -  01/17/12 0457 166/95 mmHg 98.5 F (36.9 C) Oral 75  18  98 %  01/16/12 2159 158/92 mmHg 98.7 F (37.1 C) Oral 67  18  98 %  01/16/12 1500 159/96 mmHg 98.6 F (37 C) Oral 65  18  98 %  01/16/12 1336 172/97 mmHg 98.5 F (36.9 C) Oral 71  18  97 %    Physical Exam: General:  No acute distress, awake Cardiovascular:    [x]   S1/S2 present, RRR  []   Irregularly irregular Chest:  CTA-B Abdomen:               []  Soft, appropriately TTP  []  Soft, NTTP  [x]  Soft, appropriately TTP, incision(s) clean, dry, & intact; JP serosanguinous.  Genitourinary: Foley in place Foley:  Draining light pink tinged urine.    I/O last 3 completed shifts: In: 3340 [P.O.:840; I.V.:2500] Out: 5574 [Urine:5350; Drains:224]  UO: 3650 mL JP: 164 (168) mL  Recent Labs  Basename 01/16/12 0420 01/15/12 1800   HGB 13.5 14.4   WBC 15.2* --   PLT 193 --    Recent Labs  Basename 01/17/12 0450 01/16/12 0420   NA 133* 133*   K 4.1 4.3   CL 102 99   CO2 23 23   BUN 18 15   CREATININE 0.92 1.07   CALCIUM 8.8 8.9   GFRNONAA >90 76*   GFRAA >90 88*     No results found for this basename: PT:2,INR:2,APTT:2 in the last 72 hours   No components found with this basename: ABG:2     Assessment: Right renal cell carcinoma. POD#3 Right robotic partial nephrectomy Plan: -Continue ambulation -Continue regular diet. -Continue foley catheter. -Continue Heparin. -Increase metoprolol to 50 mg BID. -Decrease IVF rate to 75 mL/hr. -Renal function back to normal range- d/c BMP's.    Natalia Leatherwood, MD [phone]

## 2012-01-18 LAB — CREATININE, FLUID (PLEURAL, PERITONEAL, JP DRAINAGE)

## 2012-01-18 NOTE — Progress Notes (Signed)
Urology Progress Note  Subjective:     No acute urologic events overnight. Positive flatus regularly. Continues to have liquid BM's. Pain well controlled.  Positive ambulation in halls. Tolerating regular diet. HTN better.  Objective:  Patient Vitals for the past 24 hrs:  BP Temp Temp src Pulse Resp SpO2  01/18/12 0533 160/88 mmHg 98.2 F (36.8 C) Oral 88  18  100 %  01/17/12 2129 173/92 mmHg - - - - -  01/17/12 2127 157/101 mmHg 98.6 F (37 C) Oral 68  20  97 %  01/17/12 1500 172/97 mmHg 98.8 F (37.1 C) Oral 89  18  99 %  01/17/12 0952 170/92 mmHg - - 66  - -    Physical Exam: General:  No acute distress, awake Cardiovascular:    [x]   S1/S2 present, RRR  []   Irregularly irregular Chest:  CTA-B Abdomen:               []  Soft, appropriately TTP  []  Soft, NTTP  [x]  Soft, appropriately TTP, incision(s) clean, dry, & intact; JP serosanguinous.  Genitourinary: Foley in place Foley:  Draining light pink tinged urine.    I/O last 3 completed shifts: In: 4955 [P.O.:1040; I.V.:3915] Out: 5345 [Urine:5100; Drains:245]  UO: 3300 mL JP: 148 (164) mL  Recent Labs  Basename 01/16/12 0420 01/15/12 1800   HGB 13.5 14.4   WBC 15.2* --   PLT 193 --    Recent Labs  Basename 01/17/12 0450 01/16/12 0420   NA 133* 133*   K 4.1 4.3   CL 102 99   CO2 23 23   BUN 18 15   CREATININE 0.92 1.07   CALCIUM 8.8 8.9   GFRNONAA >90 76*   GFRAA >90 88*     No results found for this basename: PT:2,INR:2,APTT:2 in the last 72 hours   No components found with this basename: ABG:2     Assessment: Right renal cell carcinoma. POD#4 Right robotic partial nephrectomy Plan: -Continue ambulation -Continue regular diet. -Continue foley catheter. -Continue Heparin. -Saline lock IV -Possible D/c home tomorrow.   Natalia Leatherwood, MD [phone]

## 2012-01-19 MED ORDER — HYDROCODONE-ACETAMINOPHEN 5-325 MG PO TABS: 1.0000 | ORAL_TABLET | ORAL | Status: AC | PRN

## 2012-01-19 MED ORDER — AMLODIPINE BESYLATE 10 MG PO TABS: 10.0000 mg | ORAL_TABLET | Freq: Every day | ORAL | Status: DC

## 2012-01-19 MED ORDER — HYOSCYAMINE SULFATE 0.125 MG PO TABS: 0.1250 mg | ORAL_TABLET | ORAL | Status: DC | PRN

## 2012-01-19 MED ORDER — METOPROLOL TARTRATE 50 MG PO TABS: 50.0000 mg | ORAL_TABLET | Freq: Two times a day (BID) | ORAL | Status: DC

## 2012-01-19 MED ORDER — SENNOSIDES-DOCUSATE SODIUM 8.6-50 MG PO TABS: 1.0000 | ORAL_TABLET | Freq: Two times a day (BID) | ORAL | Status: AC

## 2012-01-19 NOTE — Discharge Summary (Signed)
Physician Discharge Summary  Patient ID: [name] MRN: [id number] DOB/AGE: [birth year] 56 y.o.  Admit date: 01/14/2012 Discharge date: 01/19/2012  Admission Diagnoses: Right renal neoplasm  Discharge Diagnoses:  Renal cell carcinoma HTN  Discharged Condition: good  Hospital Course:  Patient was admitted following right partial nephrectomy.  He was able to advance his diet to clear liquids.  He was on bed rest for 24 hours; after which he ambulated and his hemoglobin remained stable, and chemoprevention of DVT was started after this was confirmed. He was able to increase his activity and advance his diet to regular.  He required the addition of norvasc and metoprolol to control his HTN. His JP drain output dropped and was confirmed to be absent of urine through JP creatinine testing.  The JP was removed and his bowel function returned in full.  Consults: None  Significant Diagnostic Studies: JP creatinine 1  Treatments: surgery: Right robot laparoscopic partial nephrectomy  Discharge Exam: Blood pressure 149/89, pulse 60, temperature 98.6 F (37 C), temperature source Oral, resp. rate 18, height 5\' 8"  (1.727 m), weight 86.183 kg (190 lb), SpO2 98.00%. See exam from progress note from date of discharge.  Disposition: Home to self care.  Discharge Orders    Future Orders Please Complete By Expires   Discharge instructions      Scheduling Instructions:   Teach foley catheter care and provide with leg bag & overnight bag.  Provide with 4x4 gauze and paper tape for JP drain site.   Discharge patient        Medication List  As of 01/19/2012  7:04 AM   STOP taking these medications         naproxen sodium 220 MG tablet         TAKE these medications         amLODipine 10 MG tablet   Commonly known as: NORVASC   Take 1 tablet (10 mg total) by mouth daily.      HYDROcodone-acetaminophen 5-325 MG per tablet   Commonly known as: NORCO   Take 1-2 tablets by mouth every 4  (four) hours as needed.      hyoscyamine 0.125 MG tablet   Commonly known as: LEVSIN, ANASPAZ   Take 1 tablet (0.125 mg total) by mouth every 4 (four) hours as needed (Bladder spasms).      metoprolol 50 MG tablet   Commonly known as: LOPRESSOR   Take 1 tablet (50 mg total) by mouth 2 (two) times daily.      penicillin v potassium 500 MG tablet   Commonly known as: VEETID   Take 500 mg by mouth 4 (four) times daily.      senna-docusate 8.6-50 MG per tablet   Commonly known as: Senokot-S   Take 1 tablet by mouth 2 (two) times daily.           Follow-up Information    Follow up with Milford Cage, MD on 01/21/2012. (8:00 am - arrive by 7:45 am)    Contact information:   [address] [facility] [address] [facility] [place] [address] [phone]          Signed: Milford Cage 01/19/2012, 7:04 AM

## 2012-01-19 NOTE — Discharge Instructions (Signed)
DISCHARGE INSTRUCTIONS FOR PARTIAL LAPAROSCOPIC NEPHRECTOMY  MEDICATIONS:   1. Follow up with your PCP regarding the blood pressure medications and their management.   ACTIVITY 1. No heavy lifting >10 pounds for 6 weeks 2. No sexual activity for 6 weeks 3. No strenuous activity for 6 weeks 4. No driving while on narcotic pain medications 5. Drink plenty of water 6. Continue to walk at home - you can still get blood clots when you are at home,  so keep active, but don't over do it. 7. You may resume normal activity in 2 wks (working but no heavy lifting)  FOLEY CATHETER  (If you go home with a catheter in place.) 1. Make sure your catheter is attached to your leg at all times - do not let  anything pull on it 2. If the urine is your tube starts looking dark red or if it stops draining,  call us immediately or come to the ER 3. Drink plenty of water, if you do notice your urine looking darking, sit down,  relax and drink lots of water 4. You will be given a leg bag as well as an overnight bag for your catheter -  MAKE SURE ATTACHED TO YOUR LEG AT ALL TIMES WITH TAPE OR LEG STRAP   BATHING 1. You can shower.  Do not submerge your catheter in a bath.   2. The JP site will close up on its own in a few days - replace the 4x4 gauze if  it becomes saturated.  SIGNS/SYMPTOMS TO CALL: 1. Please call us if you have a fever greater than 101.5, uncontrolled  nausea/vomiting, uncontrolled pain, dizziness, unable to urinate,  chest pain, shortness of breath, leg swelling, leg pain, or any other concerns or  questions.  You can reach [place] at [phone].  FOLLOW-UP 1. Catheter removal 01/21/12 at 8:00 am in clinic. Arrive by 7:45 am. 2. Cherlynn Polo will be removed 01/28/12.

## 2012-01-19 NOTE — Progress Notes (Signed)
Urology Progress Note  Subjective:     No acute urologic events overnight. Positive flatus regularly. Three solid BM's. Pain well controlled.  Positive ambulation in halls. Tolerating regular diet. HTN better controlled in last 24 hours.  Objective:  Patient Vitals for the past 24 hrs:  BP Temp Temp src Pulse Resp SpO2  01/19/12 0543 149/89 mmHg 98.6 F (37 C) Oral 60  18  98 %  01/18/12 2200 136/86 mmHg 97.8 F (36.6 C) Oral 64  16  99 %  01/18/12 1357 157/96 mmHg 98.6 F (37 C) Tympanic 69  18  99 %    Physical Exam: General:  No acute distress, awake Cardiovascular:    [x]   S1/S2 present, RRR  []   Irregularly irregular Chest:  CTA-B Abdomen:               []  Soft, appropriately TTP  []  Soft, NTTP  [x]  Soft, appropriately TTP, incision(s) clean, dry, & intact; JP serosanguinous.  Genitourinary: Foley in place Foley:  Draining light pink tinged urine.    I/O last 3 completed shifts: In: 5195 [P.O.:1280; I.V.:3915] Out: 4033 [Urine:3825; Drains:208]  UO: 1225 mL JP: 85 (148) mL  No results found for this basename: HGB:2,WBC:2,PLT:2 in the last 72 hours  Recent Labs  [facility] 01/17/12 0450   NA 133*   K 4.1   CL 102   CO2 23   BUN 18   CREATININE 0.92   CALCIUM 8.8   GFRNONAA >90   GFRAA >90    JP fluid Cr 1  No results found for this basename: PT:2,INR:2,APTT:2 in the last 72 hours   No components found with this basename: ABG:2     Assessment: Right renal cell carcinoma. POD#5 Right robotic partial nephrectomy Plan: -D/c JP drain -D/c home -Foley catheter teaching   Natalia Leatherwood, MD [phone]

## 2012-01-26 ENCOUNTER — Encounter (HOSPITAL_COMMUNITY): Payer: Self-pay | Admitting: Urology

## 2012-05-28 ENCOUNTER — Other Ambulatory Visit (HOSPITAL_COMMUNITY): Payer: Self-pay | Admitting: Urology

## 2012-05-28 ENCOUNTER — Ambulatory Visit (HOSPITAL_COMMUNITY)
Admission: RE | Admit: 2012-05-28 | Discharge: 2012-05-28 | Disposition: A | Discharge status: Home / Self Care | Source: Ambulatory Visit | Attending: Urology | Admitting: Urology

## 2012-05-28 DIAGNOSIS — C649 Malignant neoplasm of unspecified kidney, except renal pelvis: Secondary | ICD-10-CM

## 2013-01-24 ENCOUNTER — Other Ambulatory Visit (HOSPITAL_COMMUNITY): Payer: Self-pay | Admitting: Urology

## 2013-01-24 ENCOUNTER — Ambulatory Visit (HOSPITAL_COMMUNITY)
Admission: RE | Admit: 2013-01-24 | Discharge: 2013-01-24 | Disposition: A | Discharge status: Home / Self Care | Source: Ambulatory Visit | Attending: Urology | Admitting: Urology

## 2013-01-24 DIAGNOSIS — N402 Nodular prostate without lower urinary tract symptoms: Secondary | ICD-10-CM | POA: Insufficient documentation

## 2013-01-24 DIAGNOSIS — M19019 Primary osteoarthritis, unspecified shoulder: Secondary | ICD-10-CM | POA: Insufficient documentation

## 2013-01-24 DIAGNOSIS — Z85528 Personal history of other malignant neoplasm of kidney: Secondary | ICD-10-CM | POA: Insufficient documentation

## 2014-03-11 DIAGNOSIS — I214 Non-ST elevation (NSTEMI) myocardial infarction: Secondary | ICD-10-CM

## 2014-03-11 HISTORY — DX: Non-ST elevation (NSTEMI) myocardial infarction: I21.4

## 2014-03-16 ENCOUNTER — Emergency Department (HOSPITAL_COMMUNITY)

## 2014-03-16 ENCOUNTER — Inpatient Hospital Stay (HOSPITAL_COMMUNITY)
Admission: EM | Admit: 2014-03-16 | Discharge: 2014-03-20 | DRG: 251 | Disposition: A | Discharge status: Home / Self Care | Attending: Cardiovascular Disease | Admitting: Cardiovascular Disease

## 2014-03-16 ENCOUNTER — Encounter (HOSPITAL_COMMUNITY): Payer: Self-pay | Admitting: Emergency Medicine

## 2014-03-16 DIAGNOSIS — M129 Arthropathy, unspecified: Secondary | ICD-10-CM | POA: Diagnosis present

## 2014-03-16 DIAGNOSIS — E785 Hyperlipidemia, unspecified: Secondary | ICD-10-CM | POA: Diagnosis present

## 2014-03-16 DIAGNOSIS — K219 Gastro-esophageal reflux disease without esophagitis: Secondary | ICD-10-CM | POA: Diagnosis present

## 2014-03-16 DIAGNOSIS — N189 Chronic kidney disease, unspecified: Secondary | ICD-10-CM | POA: Diagnosis present

## 2014-03-16 DIAGNOSIS — I2510 Atherosclerotic heart disease of native coronary artery without angina pectoris: Secondary | ICD-10-CM | POA: Diagnosis present

## 2014-03-16 DIAGNOSIS — I129 Hypertensive chronic kidney disease with stage 1 through stage 4 chronic kidney disease, or unspecified chronic kidney disease: Secondary | ICD-10-CM | POA: Diagnosis present

## 2014-03-16 DIAGNOSIS — I77810 Thoracic aortic ectasia: Secondary | ICD-10-CM | POA: Diagnosis present

## 2014-03-16 DIAGNOSIS — I214 Non-ST elevation (NSTEMI) myocardial infarction: Principal | ICD-10-CM | POA: Diagnosis present

## 2014-03-16 DIAGNOSIS — Z87891 Personal history of nicotine dependence: Secondary | ICD-10-CM

## 2014-03-16 LAB — MRSA PCR SCREENING: MRSA by PCR: NEGATIVE

## 2014-03-16 LAB — TROPONIN I: TROPONIN I: 5.61 ng/mL — AB (ref <0.30)

## 2014-03-16 LAB — CBC
HCT: 38.4 % — ABNORMAL LOW (ref 39.0–52.0)
Hemoglobin: 13.4 g/dL (ref 13.0–17.0)
MCH: 30.7 pg (ref 26.0–34.0)
MCHC: 34.9 g/dL (ref 30.0–36.0)
MCV: 87.9 fL (ref 78.0–100.0)
PLATELETS: 284 10*3/uL (ref 150–400)
RBC: 4.37 MIL/uL (ref 4.22–5.81)
RDW: 13.9 % (ref 11.5–15.5)
WBC: 8 10*3/uL (ref 4.0–10.5)

## 2014-03-16 LAB — BASIC METABOLIC PANEL
BUN: 20 mg/dL (ref 6–23)
CALCIUM: 9.8 mg/dL (ref 8.4–10.5)
CHLORIDE: 101 meq/L (ref 96–112)
CO2: 26 mEq/L (ref 19–32)
CREATININE: 1.07 mg/dL (ref 0.50–1.35)
GFR calc non Af Amer: 75 mL/min — ABNORMAL LOW (ref >90)
GFR, EST AFRICAN AMERICAN: 87 mL/min — AB (ref >90)
Glucose, Bld: 120 mg/dL — ABNORMAL HIGH (ref 70–99)
Potassium: 4.5 mEq/L (ref 3.7–5.3)
SODIUM: 140 meq/L (ref 137–147)

## 2014-03-16 LAB — PROTIME-INR
INR: 1.04 (ref 0.00–1.49)
Prothrombin Time: 13.4 seconds (ref 11.6–15.2)

## 2014-03-16 LAB — HEPARIN LEVEL (UNFRACTIONATED): Heparin Unfractionated: 0.16 IU/mL — ABNORMAL LOW (ref 0.30–0.70)

## 2014-03-16 LAB — APTT: aPTT: 33 seconds (ref 24–37)

## 2014-03-16 MED ORDER — ACETAMINOPHEN 325 MG PO TABS: 650.0000 mg | ORAL_TABLET | ORAL | Status: DC | PRN

## 2014-03-16 MED ORDER — LISINOPRIL 10 MG PO TABS
10.0000 mg | ORAL_TABLET | Freq: Every day | ORAL | Status: DC
  Administered 2014-03-17 – 2014-03-20 (×4): 10 mg via ORAL
  Filled 2014-03-16 (×4): qty 1

## 2014-03-16 MED ORDER — DIAZEPAM 5 MG PO TABS: 5.0000 mg | ORAL_TABLET | ORAL | Status: DC

## 2014-03-16 MED ORDER — SODIUM CHLORIDE 0.9 % IV SOLN: INTRAVENOUS | Status: AC

## 2014-03-16 MED ORDER — LISINOPRIL-HYDROCHLOROTHIAZIDE 10-12.5 MG PO TABS: 1.0000 | ORAL_TABLET | Freq: Every day | ORAL | Status: DC

## 2014-03-16 MED ORDER — METOPROLOL TARTRATE 12.5 MG HALF TABLET
12.5000 mg | ORAL_TABLET | Freq: Two times a day (BID) | ORAL | Status: DC
  Administered 2014-03-16 – 2014-03-17 (×3): 12.5 mg via ORAL
  Filled 2014-03-16 (×5): qty 1

## 2014-03-16 MED ORDER — ALPRAZOLAM 0.25 MG PO TABS
0.2500 mg | ORAL_TABLET | Freq: Two times a day (BID) | ORAL | Status: DC | PRN
Start: 2014-03-16 — End: 2014-03-20

## 2014-03-16 MED ORDER — HEPARIN BOLUS VIA INFUSION
3000.0000 [IU] | Freq: Once | INTRAVENOUS | Status: AC
  Administered 2014-03-16: 3000 [IU] via INTRAVENOUS
  Filled 2014-03-16: qty 3000

## 2014-03-16 MED ORDER — ZOLPIDEM TARTRATE 5 MG PO TABS
5.0000 mg | ORAL_TABLET | Freq: Every evening | ORAL | Status: DC | PRN
Start: 2014-03-16 — End: 2014-03-20

## 2014-03-16 MED ORDER — HEPARIN (PORCINE) IN NACL 100-0.45 UNIT/ML-% IJ SOLN
1300.0000 [IU]/h | INTRAMUSCULAR | Status: DC
  Administered 2014-03-16: 1000 [IU]/h via INTRAVENOUS
  Administered 2014-03-17: 1300 [IU]/h via INTRAVENOUS
  Filled 2014-03-16 (×3): qty 250

## 2014-03-16 MED ORDER — PANTOPRAZOLE SODIUM 40 MG PO TBEC
40.0000 mg | DELAYED_RELEASE_TABLET | Freq: Every day | ORAL | Status: DC
  Administered 2014-03-17 – 2014-03-20 (×4): 40 mg via ORAL
  Filled 2014-03-16 (×4): qty 1

## 2014-03-16 MED ORDER — HYDROCHLOROTHIAZIDE 12.5 MG PO CAPS
12.5000 mg | ORAL_CAPSULE | Freq: Every day | ORAL | Status: DC
  Filled 2014-03-16: qty 1

## 2014-03-16 MED ORDER — NITROGLYCERIN 2 % TD OINT
0.5000 [in_us] | TOPICAL_OINTMENT | Freq: Four times a day (QID) | TRANSDERMAL | Status: DC
  Administered 2014-03-16 – 2014-03-18 (×6): 0.5 [in_us] via TOPICAL
  Filled 2014-03-16: qty 30

## 2014-03-16 MED ORDER — DIAZEPAM 5 MG PO TABS
5.0000 mg | ORAL_TABLET | ORAL | Status: AC
  Administered 2014-03-17: 5 mg via ORAL
  Filled 2014-03-16: qty 1

## 2014-03-16 MED ORDER — SODIUM CHLORIDE 0.9 % IV SOLN: 1.0000 mL/kg/h | INTRAVENOUS | Status: DC

## 2014-03-16 MED ORDER — ASPIRIN EC 81 MG PO TBEC
81.0000 mg | DELAYED_RELEASE_TABLET | Freq: Every day | ORAL | Status: DC
  Administered 2014-03-18 – 2014-03-20 (×3): 81 mg via ORAL
  Filled 2014-03-16 (×4): qty 1

## 2014-03-16 MED ORDER — HEPARIN BOLUS VIA INFUSION
4000.0000 [IU] | Freq: Once | INTRAVENOUS | Status: AC
  Administered 2014-03-16: 4000 [IU] via INTRAVENOUS

## 2014-03-16 MED ORDER — NITROGLYCERIN 0.4 MG SL SUBL: 0.4000 mg | SUBLINGUAL_TABLET | SUBLINGUAL | Status: DC | PRN

## 2014-03-16 MED ORDER — ONDANSETRON HCL 4 MG/2ML IJ SOLN: 4.0000 mg | Freq: Four times a day (QID) | INTRAMUSCULAR | Status: DC | PRN

## 2014-03-16 MED ORDER — ASPIRIN 81 MG PO CHEW
324.0000 mg | CHEWABLE_TABLET | Freq: Once | ORAL | Status: AC
  Administered 2014-03-16: 324 mg via ORAL
  Filled 2014-03-16: qty 4

## 2014-03-16 MED ORDER — SIMVASTATIN 40 MG PO TABS
40.0000 mg | ORAL_TABLET | Freq: Every day | ORAL | Status: DC
  Administered 2014-03-16 – 2014-03-19 (×4): 40 mg via ORAL
  Filled 2014-03-16 (×5): qty 1

## 2014-03-16 NOTE — Progress Notes (Signed)
ANTICOAGULATION CONSULT NOTE - Follow Up Consult  Pharmacy Consult for Heparin Indication: chest pain/ACS  No Known Allergies  Patient Measurements: Height: 5\' 8"  (172.7 cm) Weight: 185 lb (83.915 kg) IBW/kg (Calculated) : 68.4 Heparin Dosing Weight: 74 kg  Vital Signs: Temp: 98.8 F (37.1 C) (04/30 1944) Temp src: Oral (04/30 1944) BP: 127/80 mmHg (04/30 2030) Pulse Rate: 93 (04/30 2030)  Labs:  Recent Labs  03/16/14 1215 03/16/14 2052  HGB 13.4  --   HCT 38.4*  --   PLT 284  --   APTT 33  --   LABPROT 13.4  --   INR 1.04  --   HEPARINUNFRC  --  0.16*  CREATININE 1.07  --   TROPONINI 5.61*  --     Estimated Creatinine Clearance: 79.4 ml/min (by C-G formula based on Cr of 1.07).  Assessment:   Initial heparin level is subtherapeutic (0.16) on 1000 units/hr.  For cardiac cath on 5/1.  Goal of Therapy:  Heparin level 0.3-0.7 units/ml Monitor platelets by anticoagulation protocol: Yes   Plan:   Heparin 3000 units IV re-bolus.  Increase heparin drip to 1300 units/hr.  Next heparin level and CBC in am.  Arty Baumgartner, [place] Pager: [phone] 03/16/2014,10:01 PM

## 2014-03-16 NOTE — ED Provider Notes (Signed)
CSN: [id number]     Arrival date & time 03/16/14  1114 History   First MD Initiated Contact with Patient 03/16/14 1310     Chief Complaint  Patient presents with  . Chest Pain     HPI Pt was seen at 1310.  Per pt, c/o gradual onset and resolution of one episode of chest "pain" that occurred 5 days ago. Pt states 5 days ago he was "manually cleaning out a clogged drain" before his symptoms began. Pt states he finished working, went in the house, and developed mid-sternal CP, bilat arms "numbness," SOB and diaphoresis. Pt states his symptoms lasted for 8 hours before resolving. Pt states he "just feels tired" for the past 5 days. Denies return of symptoms. Denies abd pain, no back pain, no neck pain, no N/V/D, no palpitations, no fevers.      Past Medical History  Diagnosis Date  . Chronic kidney disease     right renal mass   . GERD (gastroesophageal reflux disease)   . Arthritis     generalized   . Tooth infection Jan 09 2012    began on antb  . Hypertension   . Cancer    Past Surgical History  Procedure Laterality Date  . Back surgery      1989  . Tonsillectomy    . Robot assisted laparoscopic nephrectomy  01/14/2012    Procedure: ROBOTIC ASSISTED LAPAROSCOPIC NEPHRECTOMY;  Surgeon: Molli Hazard, MD;  Location: [facility];  Service: Urology;  Laterality: Right;  Robot Laparoscopic Right Partial Nephrectomy      History  Substance Use Topics  . Smoking status: Former Smoker    Quit date: 11/18/1979  . Smokeless tobacco: Never Used  . Alcohol Use: No    Review of Systems ROS: Statement: All systems negative except as marked or noted in the HPI; Constitutional: Negative for fever and chills. ; ; Eyes: Negative for eye pain, redness and discharge. ; ; ENMT: Negative for ear pain, hoarseness, nasal congestion, sinus pressure and sore throat. ; ; Cardiovascular: +CP, SOB, diaphoresis. Negative for palpitations and peripheral edema. ; ; Respiratory: Negative for cough,  wheezing and stridor. ; ; Gastrointestinal: Negative for nausea, vomiting, diarrhea, abdominal pain, blood in stool, hematemesis, jaundice and rectal bleeding. . ; ; Genitourinary: Negative for dysuria, flank pain and hematuria. ; ; Musculoskeletal: Negative for back pain and neck pain. Negative for swelling and trauma.; ; Skin: Negative for pruritus, rash, abrasions, blisters, bruising and skin lesion.; ; Neuro: Negative for headache, lightheadedness and neck stiffness. Negative for weakness, altered level of consciousness , altered mental status, extremity weakness, paresthesias, involuntary movement, seizure and syncope.      Allergies  Review of patient's allergies indicates no known allergies.  Home Medications   Prior to Admission medications   Medication Sig Start Date End Date Taking? Authorizing Provider  lisinopril-hydrochlorothiazide (PRINZIDE,ZESTORETIC) 10-12.5 MG per tablet Take 1 tablet by mouth daily.   Yes Historical Provider, MD   BP 102/68  Pulse 78  Temp(Src) 97.9 F (36.6 C) (Oral)  Resp 12  Ht 5\' 8"  (1.727 m)  Wt 185 lb (83.915 kg)  BMI 28.14 kg/m2  SpO2 100% Physical Exam 1315: Physical examination:  Nursing notes reviewed; Vital signs and O2 SAT reviewed;  Constitutional: Well developed, Well nourished, Well hydrated, In no acute distress; Head:  Normocephalic, atraumatic; Eyes: EOMI, PERRL, No scleral icterus; ENMT: Mouth and pharynx normal, Mucous membranes moist; Neck: Supple, Full range of motion, No lymphadenopathy; Cardiovascular: Regular  rate and rhythm, No gallop; Respiratory: Breath sounds clear & equal bilaterally, No wheezes.  Speaking full sentences with ease, Normal respiratory effort/excursion; Chest: Nontender, Movement normal; Abdomen: Soft, Nontender, Nondistended, Normal bowel sounds; Genitourinary: No CVA tenderness; Extremities: Pulses normal, No tenderness, No edema, No calf edema or asymmetry.; Neuro: AA&Ox3, Major CN grossly intact. Speech clear.  No gross focal motor or sensory deficits in extremities.; Skin: Color normal, Warm, Dry.   ED Course  Procedures     EKG Interpretation None        Date: 03/16/2014 on arrival  Rate: 72  Rhythm: normal sinus rhythm  QRS Axis: normal  Intervals: normal  ST/T Wave abnormalities: ST elevation leads III, aVF; ST depression leads I, aVL  Conduction Disutrbances:none  Narrative Interpretation:   Old EKG Reviewed: none available    Date: 03/16/2014 repeat  Rate: 68  Rhythm: normal sinus rhythm  QRS Axis: normal  Intervals: normal  ST/T Wave abnormalities: ST elevation leads III, aVF; ST depression leads I, aVL  Conduction Disutrbances:none  Narrative Interpretation:   Old EKG Reviewed: unchanged from previous EKG today.         MDM  MDM Reviewed: previous chart, nursing note and vitals Reviewed previous: labs and ECG Interpretation: labs, ECG and x-ray Total time providing critical care: 30-74 minutes. This excludes time spent performing separately reportable procedures and services. Consults: cardiology    CRITICAL CARE Performed by: [name] Total critical care time: 40 Critical care time was exclusive of separately billable procedures and treating other patients. Critical care was necessary to treat or prevent imminent or life-threatening deterioration. Critical care was time spent personally by me on the following activities: development of treatment plan with patient and/or surrogate as well as nursing, discussions with consultants, evaluation of patient's response to treatment, examination of patient, obtaining history from patient or surrogate, ordering and performing treatments and interventions, ordering and review of laboratory studies, ordering and review of radiographic studies, pulse oximetry and re-evaluation of patient's condition.  Results for orders placed during the hospital encounter of 03/16/14  CBC      Result Value Ref Range   WBC  8.0  4.0 - 10.5 K/uL   RBC 4.37  4.22 - 5.81 MIL/uL   Hemoglobin 13.4  13.0 - 17.0 g/dL   HCT 38.4 (*) 39.0 - 52.0 %   MCV 87.9  78.0 - 100.0 fL   MCH 30.7  26.0 - 34.0 pg   MCHC 34.9  30.0 - 36.0 g/dL   RDW 13.9  11.5 - 15.5 %   Platelets 284  150 - 400 K/uL  BASIC METABOLIC PANEL      Result Value Ref Range   Sodium 140  137 - 147 mEq/L   Potassium 4.5  3.7 - 5.3 mEq/L   Chloride 101  96 - 112 mEq/L   CO2 26  19 - 32 mEq/L   Glucose, Bld 120 (*) 70 - 99 mg/dL   BUN 20  6 - 23 mg/dL   Creatinine, Ser 1.07  0.50 - 1.35 mg/dL   Calcium 9.8  8.4 - 10.5 mg/dL   GFR calc non Af Amer 75 (*) >90 mL/min   GFR calc Af Amer 87 (*) >90 mL/min  TROPONIN I      Result Value Ref Range   Troponin I 5.61 (*) <0.30 ng/mL  APTT      Result Value Ref Range   aPTT 33  24 - 37 seconds  PROTIME-INR  Result Value Ref Range   Prothrombin Time 13.4  11.6 - 15.2 seconds   INR 1.04  0.00 - 1.49   Dg Chest Port 1 View 03/16/2014   CLINICAL DATA:  Chest pain.  EXAM: PORTABLE CHEST - 1 VIEW  COMPARISON:  09/12/2013  FINDINGS: Pulmonary hyperinflation again noted. Both lungs remain clear. No evidence of pneumothorax or pleural effusion. Heart size is within normal limits.  IMPRESSION: Pulmonary hyperinflation.  No active disease.   Electronically Signed   By: Earle Gell M.D.   On: 03/16/2014 14:05    1330:  Troponin elevated. Minimal STE on EKG, but pt without symptoms currently. ASA and IV heparin ordered. Dx and testing d/w pt and family.  Questions answered.  Verb understanding, agreeable to transfer to [facility] for admit. T/C to [facility] Cards Dr. Harl Bowie, case discussed, including:  HPI, pertinent PM/SHx, VS/PE, dx testing, ED course and treatment:  Agrees with ED treatment and pt is not acute code STEMI, he requests to call Cards MD at [facility] for admit. T/C to [facility] Cards Dr. Johnsie Cancel, case discussed, including:  HPI, pertinent PM/SHx, VS/PE, dx testing, ED course and treatment:  Agreeable with ED treatment and pt not  acute code STEMI at this time, agreeable to accept transfer/admit, requests to obtain stepdown bed.      [name], DO 03/17/14 1759

## 2014-03-16 NOTE — ED Notes (Signed)
Pt states he was unclogging a drain pipe on Saturday. States he experienced numbness in both arms and it has continued. Denies pain at present

## 2014-03-16 NOTE — H&P (Signed)
Physician History and Physical     Patient ID: JOZSEF WESCOAT MRN: [id number] DOB/AGE: 59-08-1956 59 y.o. Admit date: 03/16/2014  Primary Care Physician: Rory Percy, MD Primary Cardiologist:  To be followed in [facility]   Active Problems:   NSTEMI (non-ST elevation myocardial infarction)   NSTEMI (non-ST elevated myocardial infarction)   HPI:   58 yo with HTN.  No previous CAD.  Last week had "bug"with nausea and vomiting.  Saturday was trying to clean out blocked plumbing at house.  After very strenuous activity started  Getting diaphoresis pain and tingling in chest radiating to both arms.   Had some dyspnea as well  Pain lasted about 8 hours and resolved  Was accompanying his wife to [facility] today and just felt weak and exhausted.  Went to ER and troponin found to be 5 with j point elevation in inferior leads.  Transferred to unit on heparin , no pain and stable hemodynamics Normal renal function, no bleeding diathesis or previous stroke and compliant with BP meds.   Review of systems complete and found to be negative unless listed above   Past Medical History  Diagnosis Date  . Chronic kidney disease     right renal mass   . GERD (gastroesophageal reflux disease)   . Arthritis     generalized   . Tooth infection Jan 09 2012    began on antb  . Hypertension   . Cancer     No family history on file.  History   Social History  . Marital Status: Married    Spouse Name: N/A    Number of Children: N/A  . Years of Education: N/A   Occupational History  . Not on file.   Social History Main Topics  . Smoking status: Former Smoker    Quit date: 11/18/1979  . Smokeless tobacco: Never Used  . Alcohol Use: No  . Drug Use: No  . Sexual Activity: Not on file   Other Topics Concern  . Not on file   Social History Narrative  . No narrative on file    Past Surgical History  Procedure Laterality Date  . Back surgery      1989  . Tonsillectomy    . Robot  assisted laparoscopic nephrectomy  01/14/2012    Procedure: ROBOTIC ASSISTED LAPAROSCOPIC NEPHRECTOMY;  Surgeon: Molli Hazard, MD;  Location: [facility];  Service: Urology;  Laterality: Right;  Robot Laparoscopic Right Partial Nephrectomy       Prescriptions prior to admission  Medication Sig Dispense Refill  . lisinopril-hydrochlorothiazide (PRINZIDE,ZESTORETIC) 10-12.5 MG per tablet Take 1 tablet by mouth daily.        Physical Exam: Blood pressure 112/83, pulse 82, temperature 97.9 F (36.6 C), temperature source Oral, resp. rate 11, height 5\' 8"  (1.727 m), weight 185 lb (83.915 kg), SpO2 100.00%.   Affect appropriate Healthy:  appears stated age 31: normal Neck supple with no adenopathy JVP normal no bruits no thyromegaly Lungs clear with no wheezing and good diaphragmatic motion Heart:  S1/S2 no murmur, no rub, gallop or click PMI normal Abdomen: benighn, BS positve, no tenderness, no AAA no bruit.  No HSM or HJR Distal pulses intact with no bruits No edema Neuro non-focal Skin warm and dry No muscular weakness  Meds:  Zestoretic   Labs:   Lab Results  Component Value Date   WBC 8.0 03/16/2014   HGB 13.4 03/16/2014   HCT 38.4* 03/16/2014   MCV 87.9  03/16/2014   PLT 284 03/16/2014     Recent Labs Lab 03/16/14 1215  NA 140  K 4.5  CL 101  CO2 26  BUN 20  CREATININE 1.07  CALCIUM 9.8  GLUCOSE 120*   Lab Results  Component Value Date   TROPONINI 5.61* 03/16/2014        Radiology: Dg Chest Port 1 View  03/16/2014   CLINICAL DATA:  Chest pain.  EXAM: PORTABLE CHEST - 1 VIEW  COMPARISON:  09/12/2013  FINDINGS: Pulmonary hyperinflation again noted. Both lungs remain clear. No evidence of pneumothorax or pleural effusion. Heart size is within normal limits.  IMPRESSION: Pulmonary hyperinflation.  No active disease.   Electronically Signed   By: Earle Gell M.D.   On: 03/16/2014 14:05    EKG:  SR rate 66 J point elevation in 2,3,F  ASSESSMENT AND  PLAN:  SEMI:  Likely 5 days ago MI post exertion.  Hold diuretic  Add beta blocker  Continue heparin  Cath in am with Dr Irish Lack.  Risks including stroke, MI, bleeding and need for emergency surgery discussed Willing to proceed HTN:  Continue ACE and add beta blocker hold diuretic  Chol:  Add statin   Signed: Wallis Bamberg Nishan4/30/2015, 6:11 PM

## 2014-03-16 NOTE — Progress Notes (Signed)
ANTICOAGULATION CONSULT NOTE - Initial Consult  Pharmacy Consult for Heparin Indication: chest pain/ACS  No Known Allergies  Patient Measurements: Height: 5\' 8"  (172.7 cm) Weight: 185 lb (83.915 kg) IBW/kg (Calculated) : 68.4  Vital Signs: Temp: 97.9 F (36.6 C) (04/30 1128) Temp src: Oral (04/30 1128) BP: 107/84 mmHg (04/30 1128) Pulse Rate: 76 (04/30 1128)  Labs:  Recent Labs  03/16/14 1215  HGB 13.4  HCT 38.4*  PLT 284  CREATININE 1.07  TROPONINI 5.61*   Estimated Creatinine Clearance: 79.4 ml/min (by C-G formula based on Cr of 1.07).  Medical History: Past Medical History  Diagnosis Date  . Chronic kidney disease     right renal mass   . GERD (gastroesophageal reflux disease)   . Arthritis     generalized   . Tooth infection Jan 09 2012    began on antb  . Hypertension   . Cancer    Assessment: 58yo male who was unclogging drain pipe and experienced numbness in both arms which has not resolved.  Asked to initiate Heparin for ACS.  Goal of Therapy:  Heparin level 0.3-0.7 units/ml Monitor platelets by anticoagulation protocol: Yes   Plan:  Heparin 4000 units IV now x 1 Heparin infusion at 12 units/Kg/Hr Heparin level in 6-8 hours then daily CBC daily while on Heparin  Britteny Fiebelkorn A Leisel Pinette 03/16/2014,1:43 PM

## 2014-03-16 NOTE — ED Notes (Signed)
CRITICAL VALUE ALERT  Critical value received:  Trop 5.61  Date of notification:  03/16/14  Time of notification:  8144  Critical value read back:yes  Nurse who received alert:  t vogler rn  MD notified (1st page):  mcmanus  Time of first page:  1315  MD notified (2nd page):  Time of second page:  Responding MD:    Time MD responded:

## 2014-03-17 ENCOUNTER — Encounter (HOSPITAL_COMMUNITY)
Admission: EM | Disposition: A | Discharge status: Home / Self Care | Source: Home / Self Care | Attending: Cardiovascular Disease

## 2014-03-17 DIAGNOSIS — I214 Non-ST elevation (NSTEMI) myocardial infarction: Principal | ICD-10-CM

## 2014-03-17 DIAGNOSIS — I2510 Atherosclerotic heart disease of native coronary artery without angina pectoris: Secondary | ICD-10-CM

## 2014-03-17 HISTORY — PX: LEFT HEART CATHETERIZATION WITH CORONARY ANGIOGRAM: SHX5451

## 2014-03-17 LAB — CBC
HCT: 35.6 % — ABNORMAL LOW (ref 39.0–52.0)
Hemoglobin: 12.3 g/dL — ABNORMAL LOW (ref 13.0–17.0)
MCH: 30.7 pg (ref 26.0–34.0)
MCHC: 34.6 g/dL (ref 30.0–36.0)
MCV: 88.8 fL (ref 78.0–100.0)
Platelets: 289 10*3/uL (ref 150–400)
RBC: 4.01 MIL/uL — AB (ref 4.22–5.81)
RDW: 13.9 % (ref 11.5–15.5)
WBC: 8.4 10*3/uL (ref 4.0–10.5)

## 2014-03-17 LAB — COMPREHENSIVE METABOLIC PANEL
ALT: 44 U/L (ref 0–53)
AST: 25 U/L (ref 0–37)
Albumin: 3.1 g/dL — ABNORMAL LOW (ref 3.5–5.2)
Alkaline Phosphatase: 83 U/L (ref 39–117)
BUN: 22 mg/dL (ref 6–23)
CO2: 24 mEq/L (ref 19–32)
Calcium: 9.1 mg/dL (ref 8.4–10.5)
Chloride: 105 mEq/L (ref 96–112)
Creatinine, Ser: 1.02 mg/dL (ref 0.50–1.35)
GFR calc Af Amer: >90 mL/min (ref >90)
GFR calc non Af Amer: 79 mL/min — ABNORMAL LOW (ref >90)
Glucose, Bld: 115 mg/dL — ABNORMAL HIGH (ref 70–99)
Potassium: 4 mEq/L (ref 3.7–5.3)
Sodium: 144 mEq/L (ref 137–147)
Total Bilirubin: 0.3 mg/dL (ref 0.3–1.2)
Total Protein: 6.4 g/dL (ref 6.0–8.3)

## 2014-03-17 LAB — POCT ACTIVATED CLOTTING TIME
ACTIVATED CLOTTING TIME: 243 s
Activated Clotting Time: 298 seconds

## 2014-03-17 LAB — LIPID PANEL
Cholesterol: 170 mg/dL (ref 0–200)
HDL: 27 mg/dL — ABNORMAL LOW (ref >39)
LDL Cholesterol: 110 mg/dL — ABNORMAL HIGH (ref 0–99)
Total CHOL/HDL Ratio: 6.3 RATIO
Triglycerides: 163 mg/dL — ABNORMAL HIGH (ref <150)
VLDL: 33 mg/dL (ref 0–40)

## 2014-03-17 LAB — PROTIME-INR
INR: 1.08 (ref 0.00–1.49)
PROTHROMBIN TIME: 13.8 s (ref 11.6–15.2)

## 2014-03-17 LAB — HEPARIN LEVEL (UNFRACTIONATED)
Heparin Unfractionated: 0.41 IU/mL (ref 0.30–0.70)
Heparin Unfractionated: 0.34 IU/mL (ref 0.30–0.70)

## 2014-03-17 LAB — CK TOTAL AND CKMB (NOT AT ARMC)
CK, MB: 2.8 ng/mL (ref 0.3–4.0)
RELATIVE INDEX: 1.8 (ref 0.0–2.5)
Total CK: 152 U/L (ref 7–232)

## 2014-03-17 LAB — TROPONIN I: Troponin I: 5.66 ng/mL (ref <0.30)

## 2014-03-17 SURGERY — LEFT HEART CATHETERIZATION WITH CORONARY ANGIOGRAM
Anesthesia: LOCAL

## 2014-03-17 MED ORDER — ASPIRIN 81 MG PO CHEW
CHEWABLE_TABLET | ORAL | Status: AC
  Administered 2014-03-17: 324 mg
  Filled 2014-03-17: qty 4

## 2014-03-17 MED ORDER — CLOPIDOGREL BISULFATE 300 MG PO TABS
300.0000 mg | ORAL_TABLET | Freq: Once | ORAL | Status: AC
  Administered 2014-03-17: 300 mg via ORAL
  Filled 2014-03-17 (×3): qty 1

## 2014-03-17 MED ORDER — SODIUM CHLORIDE 0.9 % IV SOLN
INTRAVENOUS | Status: AC
  Administered 2014-03-17: 17:00:00 via INTRAVENOUS

## 2014-03-17 MED ORDER — MIDAZOLAM HCL 2 MG/2ML IJ SOLN
INTRAMUSCULAR | Status: AC
  Filled 2014-03-17: qty 2

## 2014-03-17 MED ORDER — NITROGLYCERIN 0.2 MG/ML ON CALL CATH LAB
INTRAVENOUS | Status: AC
  Filled 2014-03-17: qty 1

## 2014-03-17 MED ORDER — FENTANYL CITRATE 0.05 MG/ML IJ SOLN
INTRAMUSCULAR | Status: AC
  Filled 2014-03-17: qty 2

## 2014-03-17 MED ORDER — CLOPIDOGREL BISULFATE 75 MG PO TABS
75.0000 mg | ORAL_TABLET | Freq: Every day | ORAL | Status: DC
  Administered 2014-03-18 – 2014-03-20 (×3): 75 mg via ORAL
  Filled 2014-03-17 (×3): qty 1

## 2014-03-17 MED ORDER — ASPIRIN 81 MG PO CHEW: 81.0000 mg | CHEWABLE_TABLET | Freq: Every day | ORAL | Status: DC

## 2014-03-17 MED ORDER — LIDOCAINE HCL (PF) 1 % IJ SOLN
INTRAMUSCULAR | Status: AC
  Filled 2014-03-17: qty 30

## 2014-03-17 MED ORDER — HEPARIN SODIUM (PORCINE) 1000 UNIT/ML IJ SOLN
INTRAMUSCULAR | Status: AC
  Filled 2014-03-17: qty 1

## 2014-03-17 MED ORDER — HEPARIN (PORCINE) IN NACL 100-0.45 UNIT/ML-% IJ SOLN
1500.0000 [IU]/h | INTRAMUSCULAR | Status: DC
  Administered 2014-03-17: 1300 [IU]/h via INTRAVENOUS
  Filled 2014-03-17 (×2): qty 250

## 2014-03-17 MED ORDER — TIROFIBAN HCL IV 5 MG/100ML
INTRAVENOUS | Status: AC
  Filled 2014-03-17: qty 100

## 2014-03-17 MED ORDER — HEPARIN (PORCINE) IN NACL 2-0.9 UNIT/ML-% IJ SOLN
INTRAMUSCULAR | Status: AC
  Filled 2014-03-17: qty 1000

## 2014-03-17 NOTE — Interval H&P Note (Signed)
Cath Lab Visit (complete for each Cath Lab visit)  Clinical Evaluation Leading to the Procedure:   ACS: yes  Non-ACS:    Anginal Classification: CCS IV  Anti-ischemic medical therapy: Maximal Therapy (2 or more classes of medications)  Non-Invasive Test Results: No non-invasive testing performed  Prior CABG: No previous CABG      History and Physical Interval Note:  03/17/2014 11:04 AM  [name]  has presented today for surgery, with the diagnosis of MI  The various methods of treatment have been discussed with the patient and family. After consideration of risks, benefits and other options for treatment, the patient has consented to  Procedure(s): LEFT HEART CATHETERIZATION WITH CORONARY ANGIOGRAM (N/A) as a surgical intervention .  The patient's history has been reviewed, patient examined, no change in status, stable for surgery.  I have reviewed the patient's chart and labs.  Questions were answered to the patient's satisfaction.     Jettie Booze

## 2014-03-17 NOTE — Progress Notes (Signed)
[facility] for Heparin  Indication: chest pain/ACS  No Known Allergies  Patient Measurements: Height: 5\' 8"  (172.7 cm) Weight: 185 lb 6.5 oz (84.1 kg) IBW/kg (Calculated) : 68.4  Vital Signs: Temp: 98.2 F (36.8 C) (05/01 0742) Temp src: Oral (05/01 0742) BP: 95/75 mmHg (05/01 1353) Pulse Rate: 80 (05/01 1118)  Labs:  Recent Labs  03/16/14 1215 03/16/14 2052 03/17/14 0327 03/17/14 1000  HGB 13.4  --  12.3*  --   HCT 38.4*  --  35.6*  --   PLT 284  --  289  --   APTT 33  --   --   --   LABPROT 13.4  --  13.8  --   INR 1.04  --  1.08  --   HEPARINUNFRC  --  0.16* 0.41 0.34  CREATININE 1.07  --  1.02  --   CKTOTAL  --   --  152  --   CKMB  --   --  2.8  --   TROPONINI 5.61*  --  5.66*  --     Estimated Creatinine Clearance: 83.4 ml/min (by C-G formula based on Cr of 1.02).   Medications:  Heparin 1300 units/hr  Assessment: 58 y/o M on heparin for NSTEMI. HL was at low end of goal prior to cath. Noted unsuccessful angioplasty with medical management planned post cath. Orders received to load with plavix and restart heparin tonight. Will check HL in am   Goal of Therapy:  Heparin level 0.3-0.7 units/ml Monitor platelets by anticoagulation protocol: Yes   Plan:  -Continue heparin at 1300 units/hr -Daily CBC/HL -Monitor for bleeding  Georgina Peer 03/17/2014,3:54 PM

## 2014-03-17 NOTE — Progress Notes (Signed)
Utilization review completed.  

## 2014-03-17 NOTE — Progress Notes (Signed)
ANTICOAGULATION CONSULT NOTE - Follow Up Consult  Pharmacy Consult for Heparin  Indication: chest pain/ACS  No Known Allergies  Patient Measurements: Height: 5\' 8"  (172.7 cm) Weight: 185 lb 6.5 oz (84.1 kg) IBW/kg (Calculated) : 68.4  Vital Signs: Temp: 98 F (36.7 C) (05/01 0321) Temp src: Oral (05/01 0321) BP: 101/65 mmHg (05/01 0321) Pulse Rate: 70 (05/01 0321)  Labs:  Recent Labs  03/16/14 1215 03/16/14 2052 03/17/14 0327  HGB 13.4  --  12.3*  HCT 38.4*  --  35.6*  PLT 284  --  289  APTT 33  --   --   LABPROT 13.4  --  13.8  INR 1.04  --  1.08  HEPARINUNFRC  --  0.16* 0.41  CREATININE 1.07  --   --   TROPONINI 5.61*  --   --     Estimated Creatinine Clearance: 79.5 ml/min (by C-G formula based on Cr of 1.07).   Medications:  Heparin 1300 units/hr  Assessment: 58 y/o M on heparin for NSTEMI. HL is 0.41. Other labs as above. Likely for cath today.   Goal of Therapy:  Heparin level 0.3-0.7 units/ml Monitor platelets by anticoagulation protocol: Yes   Plan:  -Continue heparin at 1300 units/hr -Confirmatory HL at 1000, pending cath -Daily CBC/HL -Monitor for bleeding  Narda Bonds 03/17/2014,4:17 AM

## 2014-03-17 NOTE — CV Procedure (Addendum)
PROCEDURE:  Left heart catheterization with selective coronary angiography, left ventriculogram.  PTCA of proximal RCA.    INDICATIONS:  NSTEMI  The risks, benefits, and details of the procedure were explained to the patient.  The patient verbalized understanding and wanted to proceed.  Informed written consent was obtained.  PROCEDURE TECHNIQUE:  After Xylocaine anesthesia a 43F slender sheath was placed in the right radial artery with a single anterior needle wall stick.   IV heparin was given. Right coronary angiography was done using a Judkins R4 guide catheter.  Left coronary angiography was done initially using a Judkins L3.5 guide catheter, but do to some aortic root dilatation, a JL4 catheter but better.  Left ventriculography was done using a pigtail catheter.  The intervention was performed. Additional IV heparin and IV Aggrastat were given. ACT was used to check that the anticoagulation was therapeutic. After the intervention, A TR band was used for hemostasis.   CONTRAST:  Total of 190 cc.  COMPLICATIONS:  None.    HEMODYNAMICS:  Aortic pressure was 110/78; LV pressure was 110/4; LVEDP 15.  There was no gradient between the left ventricle and aorta.    ANGIOGRAPHIC DATA:   The left main coronary artery is widely patent.  The left anterior descending artery is a large vessel proximally. There is mild disease. There is a large first diagonal which originates in branches across the lateral wall. After this branch, the LAD decreases in diameter significantly. There is mild, diffuse disease. There is a medium size second diagonal is mild proximal disease. The mid to distal LAD is medium size with mild disease.  The left circumflex artery is a large vessel. The first obtuse marginal is medium size and patent. The second obtuse marginal is large and patent.  The right coronary artery is occluded proximally at the origin of an RV marginal branch.  There are faint right to right  collaterals. The distal RCA territory fills by left to right collaterals, but the collaterals are somewhat sluggish.  LEFT VENTRICULOGRAM:  Left ventricular angiogram was done in the 30 RAO projection and revealed basal to midinferior wall hypokinesis with overall normal systolic function with an estimated ejection fraction of 55 %.  LVEDP was 15 mmHg.  PCI NARRATIVE: After obtaining the diagnostic findings, I spoke to the patient at length. His symptoms originated about 5 days ago and have been waxing and waning since that time. He continued to feel bad so he went to the emergency room and ended up with a positive troponin. He notes that as soon as he was given blood thinners intravenously, he had some relief of his malaise and fatigue feeling. In reviewing the ventriculogram, it appears that the inferior wall is viable as it moves quite well, despite only faint collaterals from the left side. We therefore elected to proceed with angioplasty since it seems like he did have symptoms as recently as yesterday.  Given the waxing and waning nature of his symptoms over the past few days, I thought the RCA may be opening and closing and that revascularization would be beneficial.  A JR 4 guiding catheter was used to engage the RCA.  A pro-water wire was initially advanced.  This did appear to penetrate the proximal Of the occlusion but did not cross into the distal vessel. The RV marginal branch that originated at the occlusion was also somewhat problematic. Then switch to a Fielder XT wire and this made much more progress, to  the mid RCA territory. It would not however cross into the distal vessel. We then tried with a BMW elite wire but.as far as we did with the Singer. We then took a 2.0 x 12 emergent balloon and advanced it to the very beginning of the occlusion. The balloon was inflated to 6 atmospheres. The hope was that by opening the proximal channel, we may get better visualization of the mid and distal  vessel. There is no significant improvement in flow after the balloon inflation. We then tried with a miracle Brothers 3 g wire. This made it to the same spot in the mid RCA territory would not cross any further. My fear was that we're either in an other branch that cannot be visualized with contrast or a clot which had caused his MI had organized and was too difficult to cross. Given the risk of perforation, I elected to not take any stiffer wires to try and get down into the distal RCA system. The patient was hemodynamically stable and chest pain-free.  IMPRESSIONS:  1. Normal left main coronary artery. 2. Mild disease in the left anterior descending artery and its branches. 3. Mild disease in the left circumflex artery and its branches. 4. Occluded right coronary artery.  PTCA of the proximal right coronary artery. This did not improve visualization of the RCA. Distal RCA fills by left to right, and right to right collaterals. 5. Overall normal left ventricular systolic function.  LVEDP 15 mmHg.  Ejection fraction 55 %. 6.   Mildly dilated aortic root.  RECOMMENDATION:  Unsuccessful angioplasty, likely due to late presentation of MI. The proximal RCA has likely been occluded for several days. We will have to treat his symptoms medically. I think he will likely have some good days and bad days in the next few weeks. We'll start Plavix. We'll restart heparin after a few hours so he can complete 48 hours of anticoagulation.  Continue ACE inhibitor, beta blocker, statin and Plavix at discharge for medical therapy of his inferior MI.

## 2014-03-18 LAB — BASIC METABOLIC PANEL
BUN: 19 mg/dL (ref 6–23)
CALCIUM: 8.5 mg/dL (ref 8.4–10.5)
CO2: 19 meq/L (ref 19–32)
Chloride: 105 mEq/L (ref 96–112)
Creatinine, Ser: 0.94 mg/dL (ref 0.50–1.35)
GFR calc Af Amer: >90 mL/min (ref >90)
GFR calc non Af Amer: >90 mL/min (ref >90)
GLUCOSE: 103 mg/dL — AB (ref 70–99)
Potassium: 4.2 mEq/L (ref 3.7–5.3)
SODIUM: 139 meq/L (ref 137–147)

## 2014-03-18 LAB — CBC
HEMATOCRIT: 33.9 % — AB (ref 39.0–52.0)
HEMOGLOBIN: 11.8 g/dL — AB (ref 13.0–17.0)
MCH: 30.8 pg (ref 26.0–34.0)
MCHC: 34.8 g/dL (ref 30.0–36.0)
MCV: 88.5 fL (ref 78.0–100.0)
Platelets: 237 10*3/uL (ref 150–400)
RBC: 3.83 MIL/uL — AB (ref 4.22–5.81)
RDW: 14.1 % (ref 11.5–15.5)
WBC: 8.1 10*3/uL (ref 4.0–10.5)

## 2014-03-18 LAB — HEPARIN LEVEL (UNFRACTIONATED)
HEPARIN UNFRACTIONATED: 0.49 [IU]/mL (ref 0.30–0.70)
Heparin Unfractionated: 0.3 IU/mL (ref 0.30–0.70)

## 2014-03-18 MED ORDER — ISOSORBIDE MONONITRATE ER 30 MG PO TB24
30.0000 mg | ORAL_TABLET | Freq: Every day | ORAL | Status: DC
  Administered 2014-03-18 – 2014-03-20 (×3): 30 mg via ORAL
  Filled 2014-03-18 (×3): qty 1

## 2014-03-18 MED ORDER — SODIUM CHLORIDE 0.9 % IV SOLN
INTRAVENOUS | Status: DC
Start: 2014-03-18 — End: 2014-03-20

## 2014-03-18 MED ORDER — HEPARIN (PORCINE) IN NACL 100-0.45 UNIT/ML-% IJ SOLN
1300.0000 [IU]/h | INTRAMUSCULAR | Status: DC
  Administered 2014-03-19: 1500 [IU]/h via INTRAVENOUS
  Administered 2014-03-20: 1300 [IU]/h via INTRAVENOUS
  Filled 2014-03-18 (×5): qty 250

## 2014-03-18 MED ORDER — METOPROLOL TARTRATE 25 MG PO TABS
25.0000 mg | ORAL_TABLET | Freq: Two times a day (BID) | ORAL | Status: DC
  Administered 2014-03-18 – 2014-03-20 (×4): 25 mg via ORAL
  Filled 2014-03-18 (×4): qty 1

## 2014-03-18 NOTE — Progress Notes (Signed)
CARDIAC REHAB PHASE I   PRE:  Rate/Rhythm: 84 SR    BP: sitting 119/83    SaO2:   MODE:  Ambulation: 700 ft   POST:  Rate/Rhythm: 94 SR    BP: sitting 122/94     SaO2:   Tolerated well. Sts some general weakness, maybe from being in bed. Feels better than he did when he came in. BP elevated after walk. Ed completed. Pt is working through this new lifestyle. Interested in [place] in [place] and will send referral.  [phone]   [place], ACSM 03/18/2014 12:35 PM

## 2014-03-18 NOTE — Progress Notes (Addendum)
Subjective:  No further chest pain  Objective:   Vital Signs in the last 24 hours: Temp:  [97.8 F (36.6 C)-98.9 F (37.2 C)] 98.6 F (37 C) (05/02 0823) Pulse Rate:  [63-88] 82 (05/02 0823) Resp:  [16] 16 (05/02 0823) BP: (95-130)/(66-99) 111/76 mmHg (05/02 0823) SpO2:  [96 %-100 %] 97 % (05/02 0823) Weight:  [180 lb (81.647 kg)] 180 lb (81.647 kg) (05/02 0327)  Intake/Output from previous day: 05/01 0701 - 05/02 0700 In: 1144.1 [P.O.:250; I.V.:894.1] Out: 602 [Urine:601; Stool:1]  Medications: . aspirin EC  81 mg Oral Daily  . clopidogrel  75 mg Oral Q breakfast  . lisinopril  10 mg Oral Daily   And  . hydrochlorothiazide  12.5 mg Oral Daily  . metoprolol tartrate  12.5 mg Oral BID  . nitroGLYCERIN  0.5 inch Topical 4 times per day  . pantoprazole  40 mg Oral Q0600  . simvastatin  40 mg Oral q1800    . heparin 1,300 Units/hr (03/18/14 0800)    Physical Exam:   General appearance: alert, cooperative and no distress Neck: no carotid bruit, no JVD, supple, symmetrical, trachea midline and thyroid not enlarged, symmetric, no tenderness/mass/nodules Lungs: clear to auscultation bilaterally Heart: regular rate and rhythm Abdomen: soft, non-tender; bowel sounds normal; no masses,  no organomegaly R radial cath site stable. Extremities: no edema, redness or tenderness in the calves or thighs Pulses: 2+ and symmetric Skin: Skin color, texture, turgor normal. No rashes or lesions Neurologic: Grossly normal   Rate: 80  Rhythm: normal sinus rhythm  ECG today; NSR inferior Q waves 3, avF c/w inferior MI.  Lab Results:   Recent Labs  03/17/14 0327 03/18/14 0320  NA 144 139  K 4.0 4.2  CL 105 105  CO2 24 19  GLUCOSE 115* 103*  BUN 22 19  CREATININE 1.02 0.94    Recent Labs  03/16/14 1215 03/17/14 0327  TROPONINI 5.61* 5.66*    Hepatic Function Panel  Recent Labs  03/17/14 0327  PROT 6.4  ALBUMIN 3.1*  AST 25  ALT 44  ALKPHOS 83  BILITOT  0.3    Recent Labs  03/17/14 0327  INR 1.08   BNP (last 3 results) No results found for this basename: PROBNP,  in the last 8760 hours  Lipid Panel     Component Value Date/Time   CHOL 170 03/17/2014 0327   TRIG 163* 03/17/2014 0327   HDL 27* 03/17/2014 0327   CHOLHDL 6.3 03/17/2014 0327   VLDL 33 03/17/2014 0327   LDLCALC 110* 03/17/2014 0327      Imaging:  Dg Chest Port 1 View  03/16/2014   CLINICAL DATA:  Chest pain.  EXAM: PORTABLE CHEST - 1 VIEW  COMPARISON:  09/12/2013  FINDINGS: Pulmonary hyperinflation again noted. Both lungs remain clear. No evidence of pneumothorax or pleural effusion. Heart size is within normal limits.  IMPRESSION: Pulmonary hyperinflation.  No active disease.   Electronically Signed   By: Earle Gell M.D.   On: 03/16/2014 14:05      Assessment/Plan:   Active Problems:   NSTEMI (non-ST elevation myocardial infarction)   NSTEMI (non-ST elevated myocardial infarction)   Day 1 s/p unsucessful attempt of probable 5 day old out of hospital RCA occlusion. Medical therapy. Not well beta blocked presently, will titrate metoptolol to 25 mg bid today and plan to increase as tolerates to 50 bid. Will add low dose nitrates in attempt to improve collateral filling. DC HCTZ. On statin  and ACE_I.  On ASA/Plavix for DAPT. ECG with q waves c/w inferior MI.    Troy Sine, MD, [facility] 03/18/2014, 10:19 AM

## 2014-03-18 NOTE — Progress Notes (Addendum)
ANTICOAGULATION CONSULT NOTE - Follow Up Consult  Pharmacy Consult  :  Heparin Indication  :  Chest pain  Heparin Dosing Weight: 81 kg   Recent Labs  03/16/14 1215 03/16/14 2052 03/17/14 0327 03/17/14 1000 03/18/14 0320  HGB 13.4  --  12.3*  --  11.8*  HCT 38.4*  --  35.6*  --  33.9*  PLT 284  --  289  --  237  APTT 33  --   --   --   --   LABPROT 13.4  --  13.8  --   --   INR 1.04  --  1.08  --   --   HEPARINUNFRC  --  0.16* 0.41 0.34 0.30  CREATININE 1.07  --  1.02  --  0.94     Medications: Scheduled:  . aspirin EC  81 mg Oral Daily  . clopidogrel  75 mg Oral Q breakfast  . isosorbide mononitrate  30 mg Oral Daily  . lisinopril  10 mg Oral Daily  . metoprolol tartrate  25 mg Oral BID  . pantoprazole  40 mg Oral Q0600  . simvastatin  40 mg Oral q1800   Infusions:  . heparin 1,300 Units/hr (03/18/14 0800)   Assessment:  58 y/o male s/p unsuccessful PTCA of probable 5 day old RCA occlusion.  Medical therapy to be continued.  Patient remains on Heparin infusion until 1900 pm tonight.  Heparin rate 1300 units/hr.  HL 0.3 units/ml.  No evidence of bleeding complications noted   Goal:  Heparin level 0.3-0.7 units/ml   Plan: 1. Increase Heparin infusion to 1500 units/hr.   2. Will defer any further Heparin levels unless Heparin therapy continued.   Yulitza Shorts, Craig Guess,  Pharm.D  03/18/2014, 11:47 AM   ADDENDUM:   Verbal order from Cardiology PA to continue Heparin infusion for now.    Cardiology will evaluate in AM.  PLAN:  1. Continue Heparin at 1500 units/hr. 2. Follow up Heparin level tonight and adjust as required.  Barbra Miner, Craig Guess,  Pharm.D.   03/18/2014, 2:57 PM

## 2014-03-18 NOTE — Progress Notes (Signed)
ANTICOAGULATION CONSULT NOTE   Pharmacy Consult for Heparin  Indication: chest pain/ACS  No Known Allergies  Patient Measurements: Height: 5\' 8"  (172.7 cm) Weight: 180 lb (81.647 kg) IBW/kg (Calculated) : 68.4  Vital Signs: Temp: 98.8 F (37.1 C) (05/02 1535) Temp src: Oral (05/02 1535) BP: 92/65 mmHg (05/02 1535) Pulse Rate: 82 (05/02 0823)  Labs:  Recent Labs  03/16/14 1215  03/17/14 0327 03/17/14 1000 03/18/14 0320 03/18/14 1759  HGB 13.4  --  12.3*  --  11.8*  --   HCT 38.4*  --  35.6*  --  33.9*  --   PLT 284  --  289  --  237  --   APTT 33  --   --   --   --   --   LABPROT 13.4  --  13.8  --   --   --   INR 1.04  --  1.08  --   --   --   HEPARINUNFRC  --   < > 0.41 0.34 0.30 0.49  CREATININE 1.07  --  1.02  --  0.94  --   CKTOTAL  --   --  152  --   --   --   CKMB  --   --  2.8  --   --   --   TROPONINI 5.61*  --  5.66*  --   --   --   < > = values in this interval not displayed.  Estimated Creatinine Clearance: 82.9 ml/min (by C-G formula based on Cr of 0.94).   Medications:  Heparin 1500 units/hr  Assessment: 58 y/o M with NSTEMI on heparin and heparin level noted at goal (HL= 0.49).   Goal of Therapy:  Heparin level 0.3-0.7 units/ml Monitor platelets by anticoagulation protocol: Yes   Plan:  -Continue heparin at 1500 units/hr -Daily CBC/HL -Monitor for bleeding   Hildred Laser, Pharm D 03/18/2014 7:06 PM

## 2014-03-19 LAB — CBC
HCT: 34 % — ABNORMAL LOW (ref 39.0–52.0)
HEMOGLOBIN: 11.5 g/dL — AB (ref 13.0–17.0)
MCH: 30.3 pg (ref 26.0–34.0)
MCHC: 33.8 g/dL (ref 30.0–36.0)
MCV: 89.7 fL (ref 78.0–100.0)
Platelets: 243 10*3/uL (ref 150–400)
RBC: 3.79 MIL/uL — ABNORMAL LOW (ref 4.22–5.81)
RDW: 14.1 % (ref 11.5–15.5)
WBC: 8.5 10*3/uL (ref 4.0–10.5)

## 2014-03-19 LAB — BASIC METABOLIC PANEL
BUN: 21 mg/dL (ref 6–23)
CALCIUM: 8.9 mg/dL (ref 8.4–10.5)
CO2: 25 meq/L (ref 19–32)
Chloride: 106 mEq/L (ref 96–112)
Creatinine, Ser: 1.05 mg/dL (ref 0.50–1.35)
GFR calc Af Amer: 89 mL/min — ABNORMAL LOW (ref >90)
GFR calc non Af Amer: 76 mL/min — ABNORMAL LOW (ref >90)
GLUCOSE: 109 mg/dL — AB (ref 70–99)
Potassium: 4.3 mEq/L (ref 3.7–5.3)
Sodium: 141 mEq/L (ref 137–147)

## 2014-03-19 LAB — HEPARIN LEVEL (UNFRACTIONATED)
Heparin Unfractionated: 1.98 IU/mL — ABNORMAL HIGH (ref 0.30–0.70)
Heparin Unfractionated: 0.89 IU/mL — ABNORMAL HIGH (ref 0.30–0.70)
Heparin Unfractionated: 0.67 IU/mL (ref 0.30–0.70)

## 2014-03-19 NOTE — Progress Notes (Signed)
ANTICOAGULATION CONSULT NOTE - Follow Up Consult  Pharmacy Consult for Heparin  Indication: chest pain/ACS  Assessment: Heparin level of 1.98 likely an error, level was drawn in very close proximity to heparin infusion site, all previous levels have been normal   Plan:  -Re-check heparin level now  [employer] 03/19/2014,5:08 AM

## 2014-03-19 NOTE — Progress Notes (Signed)
ANTICOAGULATION CONSULT NOTE   Pharmacy Consult for Heparin  Indication: chest pain/ACS  No Known Allergies  Patient Measurements: Height: 5\' 8"  (172.7 cm) Weight: 180 lb 12.4 oz (82 kg) IBW/kg (Calculated) : 68.4  Vital Signs: Temp: 98.2 F (36.8 C) (05/03 1445) Temp src: Oral (05/03 1445) BP: 110/65 mmHg (05/03 1445) Pulse Rate: 78 (05/03 1445)  Labs:  Recent Labs  03/17/14 0327  03/18/14 0320  03/19/14 0315 03/19/14 0620 03/19/14 1555  HGB 12.3*  --  11.8*  --  11.5*  --   --   HCT 35.6*  --  33.9*  --  34.0*  --   --   PLT 289  --  237  --  243  --   --   LABPROT 13.8  --   --   --   --   --   --   INR 1.08  --   --   --   --   --   --   HEPARINUNFRC 0.41  < > 0.30  < > 1.98* 0.89* 0.67  CREATININE 1.02  --  0.94  --  1.05  --   --   CKTOTAL 152  --   --   --   --   --   --   CKMB 2.8  --   --   --   --   --   --   TROPONINI 5.66*  --   --   --   --   --   --   < > = values in this interval not displayed.  Estimated Creatinine Clearance: 74.2 ml/min (by C-G formula based on Cr of 1.05).   Medications:  . sodium chloride Stopped (03/19/14 1300)  . heparin 1,300 Units/hr (03/19/14 0800)    Assessment: 58 y/o M with NSTEMI on heparin and heparin level noted at goal (HL= 0.67).   Goal of Therapy:  Heparin level 0.3-0.7 units/ml Monitor platelets by anticoagulation protocol: Yes   Plan:  -Continue heparin at 1300 units/hr -Daily CBC/HL -Monitor for bleeding  Uvaldo Rising, BCPS  Clinical Pharmacist Pager [phone]  03/19/2014 4:53 PM

## 2014-03-19 NOTE — Progress Notes (Signed)
ANTICOAGULATION CONSULT NOTE - Follow Up Consult  Pharmacy Consult  :  Heparin Indication  :  NSTEMI   Heparin Dosing Weight: 81 kg   Recent Labs  03/16/14 1215  03/17/14 0327 03/17/14 1000 03/18/14 0320 03/18/14 1759 03/19/14 0315 03/19/14 0620  HGB 13.4  --  12.3*  --  11.8*  --  11.5*  --   HCT 38.4*  --  35.6*  --  33.9*  --  34.0*  --   PLT 284  --  289  --  237  --  243  --   APTT 33  --   --   --   --   --   --   --   LABPROT 13.4  --  13.8  --   --   --   --   --   INR 1.04  --  1.08  --   --   --   --   --   HEPARINUNFRC  --   < > 0.41 0.34 0.30 0.49 1.98* 0.89*  CREATININE 1.07  --  1.02  --  0.94  --  1.05  --   < > = values in this interval not displayed.  Current Medication[s]: Home Medication: Prescriptions prior to admission  Medication Sig Dispense Refill  . lisinopril-hydrochlorothiazide (PRINZIDE,ZESTORETIC) 10-12.5 MG per tablet Take 1 tablet by mouth daily.        Scheduled:  Scheduled:  . aspirin EC  81 mg Oral Daily  . clopidogrel  75 mg Oral Q breakfast  . isosorbide mononitrate  30 mg Oral Daily  . lisinopril  10 mg Oral Daily  . metoprolol tartrate  25 mg Oral BID  . pantoprazole  40 mg Oral Q0600  . simvastatin  40 mg Oral q1800    Infusion[s]: Infusions:  . sodium chloride 10 mL/hr at 03/18/14 1715  . heparin 1,500 Units/hr (03/19/14 0459)    Assessment:  58 y/o male s/p unsuccessful PTCA of 6 day old RCA occclusion.  Medical therapy to be continued.  Heparin rate 1500 units/hr.  Early AM Heparin level 1.98 units/ml but this was drawn at site adjacent to Heparin line.  Repeat Heparin level 0.89 units/ml.  No evidence of bleeding complications noted   Goal: Heparin Level  >  0.3 - 0.7 units/ml   Plan: 1. Reduce Heparin infusion to 1300 units/hr. 2. Next Heparin level in 8 hours. 3. Daily Heparin Levels, CBC, follow Platelets,  if Heparin to be continued.  Monitor for bleeding complications    Estelle June,  Pharm.D   03/19/2014, 7:34 AM

## 2014-03-19 NOTE — Progress Notes (Signed)
Pt ambulating independently in the hall.

## 2014-03-19 NOTE — Progress Notes (Addendum)
Subjective:  No recurrent chest pain; walked yesterday without discomfort.  Had mild shoulder/back sensation after meds given yesterday.  Objective:   Vital Signs in the last 24 hours: Temp:  [97.9 F (36.6 C)-99.2 F (37.3 C)] 98.9 F (37.2 C) (05/03 1153) Pulse Rate:  [64-88] 64 (05/03 1200) Resp:  [14-16] 16 (05/03 1200) BP: (92-124)/(64-78) 101/71 mmHg (05/03 1153) SpO2:  [97 %-99 %] 98 % (05/03 1153) Weight:  [180 lb 12.4 oz (82 kg)] 180 lb 12.4 oz (82 kg) (05/03 0425)  Intake/Output from previous day: 05/02 0701 - 05/03 0700 In: 1029 [P.O.:515; I.V.:514] Out: -   Medications: . aspirin EC  81 mg Oral Daily  . clopidogrel  75 mg Oral Q breakfast  . isosorbide mononitrate  30 mg Oral Daily  . lisinopril  10 mg Oral Daily  . metoprolol tartrate  25 mg Oral BID  . pantoprazole  40 mg Oral Q0600  . simvastatin  40 mg Oral q1800    . sodium chloride 10 mL/hr at 03/19/14 0800  . heparin 1,300 Units/hr (03/19/14 0800)    Physical Exam:   General appearance: alert, cooperative and no distress Neck: no carotid bruit, no JVD, supple, symmetrical, trachea midline and thyroid not enlarged, symmetric, no tenderness/mass/nodules Lungs: clear to auscultation bilaterally Heart: regular rate and rhythm Abdomen: soft, non-tender; bowel sounds normal; no masses,  no organomegaly R radial cath site stable. Extremities: no edema, redness or tenderness in the calves or thighs Pulses: 2+ and symmetric Skin: Skin color, texture, turgor normal. No rashes or lesions Neurologic: Grossly normal   Rate: 70-76  Rhythm: normal sinus rhythm  ECG today; NSR inferior Q waves 3, avF c/w inferior MI.  Lab Results:   Recent Labs  03/18/14 0320 03/19/14 0315  NA 139 141  K 4.2 4.3  CL 105 106  CO2 19 25  GLUCOSE 103* 109*  BUN 19 21  CREATININE 0.94 1.05   CBC    Component Value Date/Time   WBC 8.5 03/19/2014 0315   RBC 3.79* 03/19/2014 0315   HGB 11.5* 03/19/2014 0315   HCT  34.0* 03/19/2014 0315   PLT 243 03/19/2014 0315   MCV 89.7 03/19/2014 0315   MCH 30.3 03/19/2014 0315   MCHC 33.8 03/19/2014 0315   RDW 14.1 03/19/2014 0315     Recent Labs  03/17/14 0327  TROPONINI 5.66*    Hepatic Function Panel  Recent Labs  03/17/14 0327  PROT 6.4  ALBUMIN 3.1*  AST 25  ALT 44  ALKPHOS 83  BILITOT 0.3    Recent Labs  03/17/14 0327  INR 1.08   BNP (last 3 results) No results found for this basename: PROBNP,  in the last 8760 hours  Lipid Panel     Component Value Date/Time   CHOL 170 03/17/2014 0327   TRIG 163* 03/17/2014 0327   HDL 27* 03/17/2014 0327   CHOLHDL 6.3 03/17/2014 0327   VLDL 33 03/17/2014 0327   LDLCALC 110* 03/17/2014 0327      Imaging:  No results found.    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 BP stable. No recurrent chest tightness. Plan further titration to 50 mg bid tomorrow if BP and P allow.  Will transfer out of TCU  today.    Troy Sine, MD, [facility] 03/19/2014, 12:56 PM

## 2014-03-20 DIAGNOSIS — E785 Hyperlipidemia, unspecified: Secondary | ICD-10-CM

## 2014-03-20 LAB — CBC
HCT: 38.4 % — ABNORMAL LOW (ref 39.0–52.0)
HEMOGLOBIN: 13 g/dL (ref 13.0–17.0)
MCH: 30.7 pg (ref 26.0–34.0)
MCHC: 33.9 g/dL (ref 30.0–36.0)
MCV: 90.6 fL (ref 78.0–100.0)
Platelets: 260 10*3/uL (ref 150–400)
RBC: 4.24 MIL/uL (ref 4.22–5.81)
RDW: 14.2 % (ref 11.5–15.5)
WBC: 8.8 10*3/uL (ref 4.0–10.5)

## 2014-03-20 LAB — HEPARIN LEVEL (UNFRACTIONATED): Heparin Unfractionated: 0.61 IU/mL (ref 0.30–0.70)

## 2014-03-20 MED ORDER — METOPROLOL TARTRATE 25 MG PO TABS: 25.0000 mg | ORAL_TABLET | Freq: Two times a day (BID) | ORAL | Status: DC

## 2014-03-20 MED ORDER — ASPIRIN 81 MG PO TBEC: 81.0000 mg | DELAYED_RELEASE_TABLET | Freq: Every day | ORAL | Status: AC

## 2014-03-20 MED ORDER — SIMVASTATIN 40 MG PO TABS: 40.0000 mg | ORAL_TABLET | Freq: Every day | ORAL | Status: DC

## 2014-03-20 MED ORDER — NITROGLYCERIN 0.4 MG SL SUBL: 0.4000 mg | SUBLINGUAL_TABLET | SUBLINGUAL | Status: DC | PRN

## 2014-03-20 MED ORDER — ISOSORBIDE MONONITRATE ER 30 MG PO TB24: 30.0000 mg | ORAL_TABLET | Freq: Every day | ORAL | Status: DC

## 2014-03-20 MED ORDER — CLOPIDOGREL BISULFATE 75 MG PO TABS: 75.0000 mg | ORAL_TABLET | Freq: Every day | ORAL | Status: DC

## 2014-03-20 NOTE — Progress Notes (Addendum)
       Patient Name: [name] Date of Encounter: 03/20/2014    SUBJECTIVE: Doing well. Presented with an occluded RCA that was several days/weeks old.No angina. WIH NO V ectopy  TELEMETRY:  NS Filed Vitals:   03/19/14 1445 03/19/14 2114 03/19/14 2116 03/20/14 0550  BP: 110/65 113/75 113/75 104/70  Pulse: 78 72 76 63  Temp: 98.2 F (36.8 C)  98.8 F (37.1 C) 97.9 F (36.6 C)  TempSrc: Oral  Oral Oral  Resp: 16  17 18   Height:      Weight:    180 lb 8.9 oz (81.9 kg)  SpO2: 100%  100% 100%    Intake/Output Summary (Last 24 hours) at 03/20/14 1148 Last data filed at 03/20/14 0821  Gross per 24 hour  Intake    539 ml  Output      0 ml  Net    539 ml   LABS: Basic Metabolic Panel:  Recent Labs  03/18/14 0320 03/19/14 0315  NA 139 141  K 4.2 4.3  CL 105 106  CO2 19 25  GLUCOSE 103* 109*  BUN 19 21  CREATININE 0.94 1.05  CALCIUM 8.5 8.9   CBC:   Radiology/Studies:  No new data  Physical Exam: Blood pressure 104/70, pulse 63, temperature 97.9 F (36.6 C), temperature source Oral, resp. rate 18, height 5\' 8"  (1.727 m), weight 180 lb 8.9 oz (81.9 kg), SpO2 100.00%. Weight change: -3.5 oz (-0.1 kg)  Wt Readings from Last 3 Encounters:  03/20/14 180 lb 8.9 oz (81.9 kg)  03/20/14 180 lb 8.9 oz (81.9 kg)  01/14/12 190 lb (86.183 kg)    cHEST CLEAR cARDIAC WITH NO RUB OR GALLOP.  ASSESSMENT:  1. S/P inferior MI, recent, NSTEMI 2. No CHF or arrhythmia 3. Hyperlipidemia  Plan:  On current appropriate therapy and can be discharged today. F/U with JV in 1-2 weeks. Signed, Dobson 03/20/2014, 11:48 AM

## 2014-03-20 NOTE — Progress Notes (Signed)
CARDIAC REHAB PHASE I   PRE:  Rate/Rhythm: 83 SR  BP:  Sitting: 129/83      SaO2: 100 RA  MODE:  Ambulation: 890 ft   POST:  Rate/Rhythm: 86 SR  BP:  Sitting: 107/80     SaO2: 100 RA  [id number] Patient ambulated independently with steady gait noted. Denied complaints. No rest breaks needed. Post ambulation, patient to recliner with call bell in reach. Patient questioning activity post discharge. RN reinforced previous exercise education using exercise guidelines as visual reference. Pt verbalized understanding and demonstrated teach back. Denied futher need for other education reinforcement.  Koleen [name] English PayneRN, BSN 03/20/2014 10:00 AM

## 2014-03-20 NOTE — Discharge Summary (Signed)
Physician Discharge Summary  Patient ID: [name] MRN: [id number] DOB/AGE: 1956/06/15 58 y.o.  Admit date: 03/16/2014 Discharge date: 03/20/2014  Primary Cardiologist: Dr. Irish Lack  Admission Diagnoses: NSTEMI  Discharge Diagnoses:  Active Problems:   NSTEMI (non-ST elevation myocardial infarction)   NSTEMI (non-ST elevated myocardial infarction)   Discharged Condition: stable  Hospital Course: The patient is a 58 y/o male with no previous h/o CAD, who initially presented to [facility] on 03/16/14 with a complaint of weakness and exhaustion. Five days prior, he had exeperieced diaphoresis and tingling in his chest, radiating to both arms. He had dyspnea as well. The pain lasted for nearly 8 hours but resolved. He did not seek any medical evaluation at that time.  At the [facility] ER, his troponin was found to be 5 with j point elevation in inferior leads. He was placed on IV heparin and transferred to [facility]. He was started on a BB and statin. He underwent a LHC, performed by Dr. Irish Lack. He was found to have an occluded RCA, however an attempt at PCI was unsuccessful. The left main was normal and there was mild disease in both the LAD and left circumflex. Overall LVSF was normal at 55%. Medical therapy was recommended. He was started on ASA and Plavix, Imdur, an ACE,.BB and statin were resumed. There were no post cath complications. He had no recurrent angina or dyspnea with phase I cardiac rehab. He was last seen and examined by Dr. Tamala Julian, who determined he was stable for discharge home. He will follow-up with Richardson Dopp, PA-C, on 5/21. He will be followed long term by Dr. Irish Lack.   Consults: None  Significant Diagnostic Studies:  IMPRESSIONS:  1. Normal left main coronary artery. 2. Mild disease in the left anterior descending artery and its branches. 3. Mild disease in the left circumflex artery and its branches. 4. Occluded right coronary artery. PTCA of the proximal right coronary artery.  This did not improve visualization of the RCA. Distal RCA fills by left to right, and right to right collaterals. 5. Overall normal left ventricular systolic function. LVEDP 15 mmHg. Ejection fraction 55 %. 6. Mildly dilated aortic root   Treatments: See Hospital Course  Discharge Exam: Blood pressure 99/60, pulse 66, temperature 98.2 F (36.8 C), temperature source Oral, resp. rate 18, height 5\' 8"  (1.727 m), weight 180 lb 8.9 oz (81.9 kg), SpO2 99.00%.   Disposition: 01-Home or Self Care      Discharge Orders   Future Appointments Provider Department Dept Phone   04/06/2014 11:50 AM Liliane Shi, PA-C [facility] [phone]   Future Orders Complete By Expires   Amb Referral to Cardiac Rehabilitation  As directed        Medication List         aspirin 81 MG EC tablet  Take 1 tablet (81 mg total) by mouth daily.     clopidogrel 75 MG tablet  Commonly known as:  PLAVIX  Take 1 tablet (75 mg total) by mouth daily with breakfast.     isosorbide mononitrate 30 MG 24 hr tablet  Commonly known as:  IMDUR  Take 1 tablet (30 mg total) by mouth daily.     lisinopril-hydrochlorothiazide 10-12.5 MG per tablet  Commonly known as:  PRINZIDE,ZESTORETIC  Take 1 tablet by mouth daily.     metoprolol tartrate 25 MG tablet  Commonly known as:  LOPRESSOR  Take 1 tablet (25 mg total) by mouth 2 (two) times daily.  nitroGLYCERIN 0.4 MG SL tablet  Commonly known as:  NITROSTAT  Place 1 tablet (0.4 mg total) under the tongue every 5 (five) minutes x 3 doses as needed for chest pain.     simvastatin 40 MG tablet  Commonly known as:  ZOCOR  Take 1 tablet (40 mg total) by mouth daily at 6 PM.       Follow-up Information   Follow up with Richardson Dopp, PA-C On 04/06/2014. (11:50 am )    Specialty:  Physician Assistant   Contact information:   [address] [place] [place] [address] [phone]     TIME SPENT ON DISCHARGE, INCLUDING PHYSICIAN  TIME: >30 MINUTES  Signed: Lyda Jester PA-C 03/20/2014, 2:16 PM

## 2014-03-20 NOTE — Progress Notes (Signed)
DC home with belongings and instructions accompained by his daughter. Reviewed discharge instructions with patient voiced understanding by teachback [name]

## 2014-03-20 NOTE — Progress Notes (Signed)
ANTICOAGULATION CONSULT NOTE   Pharmacy Consult for Heparin  Indication: NSTEMI  No Known Allergies  Patient Measurements: Height: 5\' 8"  (172.7 cm) Weight: 180 lb 8.9 oz (81.9 kg) IBW/kg (Calculated) : 68.4  Vital Signs: Temp: 97.9 F (36.6 C) (05/04 0550) Temp src: Oral (05/04 0550) BP: 104/70 mmHg (05/04 0550) Pulse Rate: 63 (05/04 0550)  Labs:  Recent Labs  03/18/14 0320  03/19/14 0315 03/19/14 0620 03/19/14 1555 03/20/14 0547  HGB 11.8*  --  11.5*  --   --  13.0  HCT 33.9*  --  34.0*  --   --  38.4*  PLT 237  --  243  --   --  260  HEPARINUNFRC 0.30  < > 1.98* 0.89* 0.67 0.61  CREATININE 0.94  --  1.05  --   --   --   < > = values in this interval not displayed.  Estimated Creatinine Clearance: 74.2 ml/min (by C-G formula based on Cr of 1.05).   Medications:  . sodium chloride Stopped (03/19/14 1300)  . heparin 1,300 Units/hr (03/20/14 0037)    Assessment: 58 y/o M with NSTEMI on heparin and heparin level noted at goal (HL= 0.61). No bleeding noted.   Goal of Therapy:  Heparin level 0.3-0.7 units/ml Monitor platelets by anticoagulation protocol: Yes   Plan:  -Continue heparin at 1300 units/hr -Daily CBC/HL -Monitor for bleeding -Will f/u with MD to see if heparin can be discontinued (>48 hrs post cath)  Sherlon Handing, PharmD, BCPS Clinical pharmacist, pager [phone]  03/20/2014 10:40 AM

## 2014-03-21 NOTE — Discharge Summary (Signed)
Note is accurate and plan including f/u arranged under my direction.

## 2014-03-22 ENCOUNTER — Emergency Department (HOSPITAL_COMMUNITY)
Admission: EM | Admit: 2014-03-22 | Discharge: 2014-03-22 | Disposition: A | Discharge status: Home / Self Care | Attending: Emergency Medicine | Admitting: Emergency Medicine

## 2014-03-22 ENCOUNTER — Encounter (HOSPITAL_COMMUNITY): Payer: Self-pay | Admitting: Emergency Medicine

## 2014-03-22 DIAGNOSIS — Z7982 Long term (current) use of aspirin: Secondary | ICD-10-CM | POA: Insufficient documentation

## 2014-03-22 DIAGNOSIS — Z859 Personal history of malignant neoplasm, unspecified: Secondary | ICD-10-CM | POA: Insufficient documentation

## 2014-03-22 DIAGNOSIS — Z87891 Personal history of nicotine dependence: Secondary | ICD-10-CM | POA: Insufficient documentation

## 2014-03-22 DIAGNOSIS — M129 Arthropathy, unspecified: Secondary | ICD-10-CM | POA: Insufficient documentation

## 2014-03-22 DIAGNOSIS — I959 Hypotension, unspecified: Secondary | ICD-10-CM | POA: Insufficient documentation

## 2014-03-22 DIAGNOSIS — Z7902 Long term (current) use of antithrombotics/antiplatelets: Secondary | ICD-10-CM | POA: Insufficient documentation

## 2014-03-22 DIAGNOSIS — I129 Hypertensive chronic kidney disease with stage 1 through stage 4 chronic kidney disease, or unspecified chronic kidney disease: Secondary | ICD-10-CM | POA: Insufficient documentation

## 2014-03-22 DIAGNOSIS — Z79899 Other long term (current) drug therapy: Secondary | ICD-10-CM | POA: Insufficient documentation

## 2014-03-22 DIAGNOSIS — R42 Dizziness and giddiness: Secondary | ICD-10-CM | POA: Insufficient documentation

## 2014-03-22 DIAGNOSIS — Z8719 Personal history of other diseases of the digestive system: Secondary | ICD-10-CM | POA: Insufficient documentation

## 2014-03-22 DIAGNOSIS — N189 Chronic kidney disease, unspecified: Secondary | ICD-10-CM | POA: Insufficient documentation

## 2014-03-22 LAB — CBC WITH DIFFERENTIAL/PLATELET
BASOS ABS: 0 10*3/uL (ref 0.0–0.1)
Basophils Relative: 0 % (ref 0–1)
EOS ABS: 0.1 10*3/uL (ref 0.0–0.7)
EOS PCT: 1 % (ref 0–5)
HEMATOCRIT: 37.1 % — AB (ref 39.0–52.0)
Hemoglobin: 12.8 g/dL — ABNORMAL LOW (ref 13.0–17.0)
Lymphocytes Relative: 8 % — ABNORMAL LOW (ref 12–46)
Lymphs Abs: 0.9 10*3/uL (ref 0.7–4.0)
MCH: 30.7 pg (ref 26.0–34.0)
MCHC: 34.5 g/dL (ref 30.0–36.0)
MCV: 89 fL (ref 78.0–100.0)
MONO ABS: 0.6 10*3/uL (ref 0.1–1.0)
Monocytes Relative: 5 % (ref 3–12)
Neutro Abs: 10.5 10*3/uL — ABNORMAL HIGH (ref 1.7–7.7)
Neutrophils Relative %: 86 % — ABNORMAL HIGH (ref 43–77)
Platelets: 304 10*3/uL (ref 150–400)
RBC: 4.17 MIL/uL — ABNORMAL LOW (ref 4.22–5.81)
RDW: 14 % (ref 11.5–15.5)
WBC: 12 10*3/uL — ABNORMAL HIGH (ref 4.0–10.5)

## 2014-03-22 LAB — COMPREHENSIVE METABOLIC PANEL
ALT: 40 U/L (ref 0–53)
AST: 29 U/L (ref 0–37)
Albumin: 3.6 g/dL (ref 3.5–5.2)
Alkaline Phosphatase: 64 U/L (ref 39–117)
BUN: 30 mg/dL — ABNORMAL HIGH (ref 6–23)
CO2: 19 mEq/L (ref 19–32)
CREATININE: 1.28 mg/dL (ref 0.50–1.35)
Calcium: 9.4 mg/dL (ref 8.4–10.5)
Chloride: 103 mEq/L (ref 96–112)
GFR calc Af Amer: 70 mL/min — ABNORMAL LOW (ref >90)
GFR calc non Af Amer: 60 mL/min — ABNORMAL LOW (ref >90)
GLUCOSE: 132 mg/dL — AB (ref 70–99)
Potassium: 4.1 mEq/L (ref 3.7–5.3)
SODIUM: 140 meq/L (ref 137–147)
TOTAL PROTEIN: 6.8 g/dL (ref 6.0–8.3)
Total Bilirubin: 0.4 mg/dL (ref 0.3–1.2)

## 2014-03-22 LAB — TROPONIN I: Troponin I: <0.3 ng/mL (ref <0.30)

## 2014-03-22 MED ORDER — SODIUM CHLORIDE 0.9 % IV BOLUS (SEPSIS)
500.0000 mL | Freq: Once | INTRAVENOUS | Status: AC
  Administered 2014-03-22: 500 mL via INTRAVENOUS

## 2014-03-22 MED ORDER — LISINOPRIL 10 MG PO TABS: 10.0000 mg | ORAL_TABLET | Freq: Every day | ORAL | Status: DC

## 2014-03-22 MED ORDER — SODIUM CHLORIDE 0.9 % IV SOLN
INTRAVENOUS | Status: DC
  Administered 2014-03-22: 19:00:00 via INTRAVENOUS

## 2014-03-22 NOTE — ED Notes (Signed)
Pt was seen last week for MI- [facility]- per pt "heart cath was too risky once the doctor was in there so they couldn't do anything. They sent me home with medication to treat it"

## 2014-03-22 NOTE — Discharge Instructions (Signed)
Stop taking the Imdur and, combination tablet, lisinopril/hydrochlorothiazide. Start the new prescription for lisinopril, tomorrow, at 10 AM. Drink plenty of fluids. If you feel dizzy, sit down. Call your cardiologist office in the morning to arrange a followup appointment in 2-5 days.    Dizziness Dizziness is a common problem. It is a feeling of unsteadiness or lightheadedness. You may feel like you are about to faint. Dizziness can lead to injury if you stumble or fall. A person of any age group can suffer from dizziness, but dizziness is more common in older adults. CAUSES  Dizziness can be caused by many different things, including:  Middle ear problems.  Standing for too long.  Infections.  An allergic reaction.  Aging.  An emotional response to something, such as the sight of blood.  Side effects of medicines.  Fatigue.  Problems with circulation or blood pressure.  Excess use of alcohol, medicines, or illegal drug use.  Breathing too fast (hyperventilation).  An arrhythmia or problems with your heart rhythm.  Low red blood cell count (anemia).  Pregnancy.  Vomiting, diarrhea, fever, or other illnesses that cause dehydration.  Diseases or conditions such as Parkinson's disease, high blood pressure (hypertension), diabetes, and thyroid problems.  Exposure to extreme heat. DIAGNOSIS  To find the cause of your dizziness, your caregiver may do a physical exam, lab tests, radiologic imaging scans, or an electrocardiography test (ECG).  TREATMENT  Treatment of dizziness depends on the cause of your symptoms and can vary greatly. HOME CARE INSTRUCTIONS   Drink enough fluids to keep your urine clear or pale yellow. This is especially important in very hot weather. In the elderly, it is also important in cold weather.  If your dizziness is caused by medicines, take them exactly as directed. When taking blood pressure medicines, it is especially important to get up  slowly.  Rise slowly from chairs and steady yourself until you feel okay.  In the morning, first sit up on the side of the bed. When this seems okay, stand slowly while holding onto something until you know your balance is fine.  If you need to stand in one place for a long time, be sure to move your legs often. Tighten and relax the muscles in your legs while standing.  If dizziness continues to be a problem, have someone stay with you for a day or two. Do this until you feel you are well enough to stay alone. Have the person call your caregiver if he or she notices changes in you that are concerning.  Do not drive or use heavy machinery if you feel dizzy.  Do not drink alcohol. SEEK IMMEDIATE MEDICAL CARE IF:   Your dizziness or lightheadedness gets worse.  You feel nauseous or vomit.  You develop problems with talking, walking, weakness, or using your arms, hands, or legs.  You are not thinking clearly or you have difficulty forming sentences. It may take a friend or family member to determine if your thinking is normal.  You develop chest pain, abdominal pain, shortness of breath, or sweating.  Your vision changes.  You notice any bleeding.  You have side effects from medicine that seems to be getting worse rather than better. MAKE SURE YOU:   Understand these instructions.  Will watch your condition.  Will get help right away if you are not doing well or get worse. Document Released: 04/29/2001 Document Revised: 01/26/2012 Document Reviewed: 05/23/2011 [facility] Patient Information 2014 [place], [place].  Hypotension As your  heart beats, it forces blood through your arteries. This force is your blood pressure. If your blood pressure is too low for you to go about your normal activities or to support the organs of your body, you have hypotension. Hypotension is also referred to as low blood pressure. When your blood pressure becomes too low, you may not get enough blood to  your brain. As a result, you may feel weak, feel lightheaded, or develop a rapid heart rate. In a more severe case, you may faint. CAUSES Various conditions can cause hypotension. These include:  Blood loss.  Dehydration.  Heart or endocrine problems.  Pregnancy.  Severe infection.  Not having a well-balanced diet filled with needed nutrients.  Severe allergic reactions (anaphylaxis). Some medicines, such as blood pressure medicine or water pills (diuretics), may lower your blood pressure below normal. Sometimes taking too much medicine or taking medicine not as directed can cause hypotension. TREATMENT  Hospitalization is sometimes required for hypotension if fluid or blood replacement is needed, if time is needed for medicines to wear off, or if further monitoring is needed. Treatment might include changing your diet, changing your medicines (including medicines aimed at raising your blood pressure), and use of support stockings. HOME CARE INSTRUCTIONS   Drink enough fluids to keep your urine clear or pale yellow.  Take your medicines as directed by your health care provider.  Get up slowly from reclining or sitting positions. This gives your blood pressure a chance to adjust.  Wear support stockings as directed by your health care provider.  Maintain a healthy diet by including nutritious food, such as fruits, vegetables, nuts, whole grains, and lean meats. SEEK MEDICAL CARE IF:  You have vomiting or diarrhea.  You have a fever for more than 2 3 days.  You feel more thirsty than usual.  You feel weak and tired. SEEK IMMEDIATE MEDICAL CARE IF:   You have chest pain or a fast or irregular heartbeat.  You have a loss of feeling in some part of your body, or you lose movement in your arms or legs.  You have trouble speaking.  You become sweaty or feel lightheaded.  You faint. MAKE SURE YOU:   Understand these instructions.  Will watch your condition.  Will get  help right away if you are not doing well or get worse. Document Released: 11/03/2005 Document Revised: 08/24/2013 Document Reviewed: 05/06/2013 [facility] Patient Information 2014 [place].

## 2014-03-22 NOTE — ED Provider Notes (Signed)
CSN: [id number]     Arrival date & time 03/22/14  1809 History   First MD Initiated Contact with Patient 03/22/14 1815     Chief Complaint  Patient presents with  . Weakness     (Consider location/radiation/quality/duration/timing/severity/associated sxs/prior Treatment) Patient is a 58 y.o. male presenting with weakness. The history is provided by the patient.  Weakness   He became weak while taking his second walk of the day, with his dog. After the first walk, earlier today, he felt weak and laid down for a couple of hours. He denies chest pain, shortness of breath, nausea, vomiting, cough, back, pain or paresthesias. His weakness is global, nonfocal. He is taking his medications, as prescribed. He is newly on metoprolol, after hospital admission last week, when he had a non-STEMI. He had a catheterization however, there was no intervention. He, states that since going home from the hospital 2 days ago, he has been avoiding drinking fluids, and trying to avoid sodium. He feels that he is both eating and drinking less. He did not receive specific instructions to follow this dietary pattern. There are no other known modifying factors.  Past Medical History  Diagnosis Date  . Chronic kidney disease     right renal mass   . GERD (gastroesophageal reflux disease)   . Arthritis     generalized   . Tooth infection Jan 09 2012    began on antb  . Hypertension   . Cancer    Past Surgical History  Procedure Laterality Date  . Back surgery      1989  . Tonsillectomy    . Robot assisted laparoscopic nephrectomy  01/14/2012    Procedure: ROBOTIC ASSISTED LAPAROSCOPIC NEPHRECTOMY;  Surgeon: Molli Hazard, MD;  Location: [facility];  Service: Urology;  Laterality: Right;  Robot Laparoscopic Right Partial Nephrectomy     No family history on file. History  Substance Use Topics  . Smoking status: Former Smoker    Quit date: 11/18/1979  . Smokeless tobacco: Never Used  . Alcohol Use:  No    Review of Systems  Neurological: Positive for weakness.  All other systems reviewed and are negative.     Allergies  Review of patient's allergies indicates no known allergies.  Home Medications   Prior to Admission medications   Medication Sig Start Date End Date Taking? Authorizing Provider  aspirin EC 81 MG EC tablet Take 1 tablet (81 mg total) by mouth daily. 03/20/14  Yes Belva Crome III, MD  clopidogrel (PLAVIX) 75 MG tablet Take 1 tablet (75 mg total) by mouth daily with breakfast. 03/20/14  Yes Belva Crome III, MD  isosorbide mononitrate (IMDUR) 30 MG 24 hr tablet Take 1 tablet (30 mg total) by mouth daily. 03/20/14  Yes Belva Crome III, MD  lisinopril-hydrochlorothiazide (PRINZIDE,ZESTORETIC) 10-12.5 MG per tablet Take 1 tablet by mouth daily.   Yes Historical Provider, MD  metoprolol tartrate (LOPRESSOR) 25 MG tablet Take 1 tablet (25 mg total) by mouth 2 (two) times daily. 03/20/14  Yes Belva Crome III, MD  simvastatin (ZOCOR) 40 MG tablet Take 1 tablet (40 mg total) by mouth daily at 6 PM. 03/20/14  Yes Sinclair Grooms, MD  nitroGLYCERIN (NITROSTAT) 0.4 MG SL tablet Place 1 tablet (0.4 mg total) under the tongue every 5 (five) minutes x 3 doses as needed for chest pain. 03/20/14   Belva Crome III, MD   BP 104/81  Pulse 72  Temp(Src) 97.9  F (36.6 C) (Oral)  Resp 13  Ht 5\' 8"  (1.727 m)  Wt 180 lb (81.647 kg)  BMI 27.38 kg/m2  SpO2 100% Physical Exam  Nursing note and vitals reviewed. Constitutional: He is oriented to person, place, and time. He appears well-developed and well-nourished.  HENT:  Head: Normocephalic and atraumatic.  Right Ear: External ear normal.  Left Ear: External ear normal.  Eyes: Conjunctivae and EOM are normal. Pupils are equal, round, and reactive to light.  Neck: Normal range of motion and phonation normal. Neck supple.  Cardiovascular: Normal rate, regular rhythm, normal heart sounds and intact distal pulses.   Pulmonary/Chest:  Effort normal and breath sounds normal. No respiratory distress. He exhibits no bony tenderness.  Abdominal: Soft. There is no tenderness.  Musculoskeletal: Normal range of motion.  Neurological: He is alert and oriented to person, place, and time. No cranial nerve deficit or sensory deficit. He exhibits normal muscle tone. Coordination normal.  Skin: Skin is warm, dry and intact.  Psychiatric: He has a normal mood and affect. His behavior is normal. Judgment and thought content normal.    ED Course  Procedures (including critical care time)  Medications  0.9 %  sodium chloride infusion ( Intravenous New Bag/Given 03/22/14 1844)  sodium chloride 0.9 % bolus 500 mL (0 mLs Intravenous Stopped 03/22/14 1857)    Patient Vitals for the past 24 hrs:  BP Temp Temp src Pulse Resp SpO2 Height Weight  03/22/14 2100 104/81 mmHg - - 72 13 100 % - -  03/22/14 2030 96/74 mmHg - - 79 11 100 % - -  03/22/14 1930 98/72 mmHg - - 77 14 100 % - -  03/22/14 1810 93/76 mmHg 97.9 F (36.6 C) Oral 66 16 100 % 5\' 8"  (1.727 m) 180 lb (81.647 kg)   7:32 PM-Consult complete with on-call cardiologist. Patient case explained and discussed. He recommends stopping Imdur and HCTZ. He agrees to arrange for  patient to have  further evaluation and treatment, in the office in 2-5 days. Call ended at 21:25    9:32 PM Reevaluation with update and discussion. After initial assessment and treatment, an updated evaluation reveals he remains comfortable. No CP. Findings discussed with pt. And family, all questions answered.Richarda Blade     Labs Review Labs Reviewed  CBC WITH DIFFERENTIAL - Abnormal; Notable for the following:    WBC 12.0 (*)    RBC 4.17 (*)    Hemoglobin 12.8 (*)    HCT 37.1 (*)    Neutrophils Relative % 86 (*)    Neutro Abs 10.5 (*)    Lymphocytes Relative 8 (*)    All other components within normal limits  COMPREHENSIVE METABOLIC PANEL - Abnormal; Notable for the following:    Glucose, Bld 132  (*)    BUN 30 (*)    GFR calc non Af Amer 60 (*)    GFR calc Af Amer 70 (*)    All other components within normal limits  TROPONIN I    Imaging Review No results found.      EKG Interpretation  Date/Time:  Wednesday Mar 22 2014 18:20:23 EDT Ventricular Rate:  63 PR Interval:  135 QRS Duration: 88 QT Interval:  400 QTC Calculation: 409 R Axis:   29 Text Interpretation:  Sinus rhythm Nonspecific T abnormalities, inferior leads Since last tracing , new inferior T waves Confirmed by [facility]  MD, Kirsti Mcalpine ([id number]) on 03/22/2014 9:36:08 PM  MDM   Final diagnoses:  Dizziness  Hypotension    Dizziness related to hypotension, which is likely secondary to altered myocardial ejection fraction, with initiation of medical treatment. No evidence for congestive heart failure, acute myocardial infarction, metabolic or renal insufficiency.  Nursing Notes Reviewed/ Care Coordinated Applicable Imaging Reviewed Interpretation of Laboratory Data incorporated into ED treatment  The patient appears reasonably screened and/or stabilized for discharge and I doubt any other medical condition or other [facility] requiring further screening, evaluation, or treatment in the ED at this time prior to discharge.  Plan: Home Medications- stop Imdur and HCTZ; Home Treatments- rest, fluids; return here if the recommended treatment, does not improve the symptoms; Recommended follow up- Cardiology f/u 2-5 days    Richarda Blade, MD 03/22/14 2138

## 2014-03-22 NOTE — ED Notes (Signed)
Per family pt's meds were given to ems. Meds were not left with pt, C-com notified.

## 2014-03-22 NOTE — ED Notes (Signed)
Recent MI- treatment with home medication per pt but started with weakness today and hypotension

## 2014-03-24 ENCOUNTER — Telehealth: Payer: Self-pay | Admitting: Physician Assistant

## 2014-03-24 NOTE — Telephone Encounter (Signed)
Can someone call this patient.  I have never seen this patient.

## 2014-03-24 NOTE — Telephone Encounter (Signed)
New message     Wife said his bp is "bottom out"---97/73 at 9:40 this am.  Pt says he feels "wierd" sometimes.  Wife want to talk to a nurse-----

## 2014-03-24 NOTE — Telephone Encounter (Signed)
Calling stating he had a cath on 4/30 and was d/c on 5/4. Went back to [facility] ER on 5/6 for dizziness and hypotension.  They changed his lisinopril/hctz to Lisinopril 10 mg.  He also takes Metoprolol tart 25 mg BID.  States BP have been running low. Yesterday readings were 110/76, 105/63,100/73, last pm 10:00 124/84. Today readings have been 114/78 58; 103/83, 97/73, 103/83 and 126/86.  States he felt anxious this AM but no c/o dizziness or weakness.  Otherwise he feels fine.[name] He called wanting to know if he should take the Lisinopril 10 mg that he just picked up from pharmacy. Spoke w/th Dr. Harrington Challenger (DOD) who states for him to decrease Lisinopril to 5 mg daily.  He is scheduled to see Margaret Pyle on 5/21.  Advised pt that if he experiences dizziness or BP below 90 to call the office. He verbalizes understanding and will call if necessary.

## 2014-04-04 ENCOUNTER — Encounter: Payer: Self-pay | Admitting: *Deleted

## 2014-04-06 ENCOUNTER — Ambulatory Visit (INDEPENDENT_AMBULATORY_CARE_PROVIDER_SITE_OTHER): Admitting: Physician Assistant

## 2014-04-06 ENCOUNTER — Encounter: Payer: Self-pay | Admitting: Physician Assistant

## 2014-04-06 VITALS — BP 147/98 | HR 77 | Ht 68.0 in | Wt 184.0 lb

## 2014-04-06 DIAGNOSIS — I214 Non-ST elevation (NSTEMI) myocardial infarction: Secondary | ICD-10-CM

## 2014-04-06 DIAGNOSIS — I252 Old myocardial infarction: Secondary | ICD-10-CM

## 2014-04-06 DIAGNOSIS — I10 Essential (primary) hypertension: Secondary | ICD-10-CM

## 2014-04-06 DIAGNOSIS — E785 Hyperlipidemia, unspecified: Secondary | ICD-10-CM | POA: Insufficient documentation

## 2014-04-06 DIAGNOSIS — I2510 Atherosclerotic heart disease of native coronary artery without angina pectoris: Secondary | ICD-10-CM | POA: Insufficient documentation

## 2014-04-06 NOTE — Patient Instructions (Addendum)
Your physician recommends that you schedule a follow-up appointment in: 6 to 8 weeks in [place]   Monitor you BP for the next 5 days if it runs 140/90 or higher please call us back.   Your physician recommends that you return for lab work in: 6 weeks: LIPIDS,LFT's  In [place] [facility], written order given for blood work)   Your physician recommends that you continue on your current medications as directed. Please refer to the Current Medication list given to you today.

## 2014-04-06 NOTE — Progress Notes (Signed)
Cardiology Office Note   Date:  04/06/2014   ID:  [name], [name] [birth year], MRN [id number]  PCP:  Curtis Percy, MD  Cardiologist:  He will be established in [place]    History of Present Illness: [name] is a 58 y.o. male with a history of HTN, renal cell carcinoma status post partial nephrectomy in 2013.  Admitted 4/30-5/4 with a non-STEMI. He actually presented to the hospital with weakness and exhaustion. He had chest pain 5 days prior. Cardiac markers were abnormal. He underwent cardiac catheterization which demonstrated an occluded RCA with right to right and left-to-right collaterals. PCI was attempted but unsuccessful. Medical therapy was recommended. LV function was preserved with an EF of 55%. He was discharged on a combination of beta blocker, nitrates, ACE inhibitor, statin, aspirin, Plavix.  Seen in ED 03/22/14 for symptomatic hypotension (93/76).  Imdur and HCTZ were stopped.    He had cut back his metoprolol to 12.5 mg daily. Blood pressures were recently noted to be elevated and he increased this back to 25 mg twice a day yesterday. Since discharge, he has continued to feel better. He denies any further symptomatic hypotension. He denies any exertional chest discomfort. He denies shortness of breath. He denies syncope. Denies orthopnea, PND or edema.   Studies:  - LHC (02/2014):  Mild disease in LAD and CFX; prox RCA occluded with R-R collats, dist RCA filled by L-R collats, inf HK, EF 55%, LVEDP 15 mmHg.  PCI:  Unsuccessful angioplasty of RCA (late presentation of inf MI) - tx medically.    Recent Labs: 03/17/2014: HDL Cholesterol by NMR 27*; LDL (calc) 110*  03/22/2014: ALT 40; Creatinine 1.28; Hemoglobin 12.8*; Potassium 4.1   Wt Readings from Last 3 Encounters:  04/06/14 184 lb (83.462 kg)  03/22/14 180 lb (81.647 kg)  03/20/14 180 lb 8.9 oz (81.9 kg)     Past Medical History  Diagnosis Date  . GERD (gastroesophageal reflux disease)   . Arthritis       generalized   . Hypertension   . Coronary artery disease     a. NSTEMI (02/2014):  LHC (02/2014):  Mild disease in LAD and CFX; prox RCA occluded with R-R collats, dist RCA filled by L-R collats, inf HK, EF 55%, LVEDP 15 mmHg.  PCI:  Unsuccessful angioplasty of RCA (late presentation of inf MI) - tx medically.  . Hyperlipidemia   . Renal cell carcinoma of right kidney     s/p partial nephrectomy in 2013    Current Outpatient Prescriptions  Medication Sig Dispense Refill  . aspirin EC 81 MG EC tablet Take 1 tablet (81 mg total) by mouth daily.      . clopidogrel (PLAVIX) 75 MG tablet Take 1 tablet (75 mg total) by mouth daily with breakfast.  30 tablet  3  . lisinopril (PRINIVIL,ZESTRIL) 10 MG tablet Take 1/2 tablet daily  30 tablet  0  . metoprolol tartrate (LOPRESSOR) 25 MG tablet Take 1 tablet (25 mg total) by mouth 2 (two) times daily.  60 tablet  11  . nitroGLYCERIN (NITROSTAT) 0.4 MG SL tablet Place 1 tablet (0.4 mg total) under the tongue every 5 (five) minutes x 3 doses as needed for chest pain.  25 tablet  12  . simvastatin (ZOCOR) 40 MG tablet Take 1 tablet (40 mg total) by mouth daily at 6 PM.  30 tablet  11  . [DISCONTINUED] isosorbide mononitrate (IMDUR) 30 MG 24 hr tablet Take 1 tablet (30 mg total)  by mouth daily.  30 tablet  3  . [DISCONTINUED] lisinopril-hydrochlorothiazide (PRINZIDE,ZESTORETIC) 10-12.5 MG per tablet Take 1 tablet by mouth daily.       No current facility-administered medications for this visit.    Allergies:   Review of patient's allergies indicates no known allergies.   Social History:  The patient  reports that he quit smoking about 34 years ago. He has never used smokeless tobacco. He reports that he does not drink alcohol or use illicit drugs.   Family History:  The patient's family history is not on file.   ROS:  Please see the history of present illness.      All other systems reviewed and negative.   PHYSICAL EXAM: VS:  BP 147/98  Pulse 77   Ht 5\' 8"  (1.727 m)  Wt 184 lb (83.462 kg)  BMI 27.98 kg/m2 Well nourished, well developed, in no acute distress HEENT: normal Neck: no JVD Cardiac:  normal S1, S2; RRR; no murmur Lungs:  clear to auscultation bilaterally, no wheezing, rhonchi or rales Abd: soft, nontender, no hepatomegaly Ext: no edemaright wrist without hematoma or mass  Skin: warm and dry Neuro:  CNs 2-12 intact, no focal abnormalities noted  EKG:  NSR, HR 77, normal axis, inferolateral T wave inversions, no change from prior tracing     ASSESSMENT AND PLAN:  1. History of NSTEMI treated medically (CTO of RCA): He is doing well. He is not having any symptoms suggestive of angina. Continue aspirin, Plavix, beta blocker. I have encouraged him to enroll in cardiac rehabilitation. 2. Coronary Artery Disease: Continue aspirin, Plavix, beta blocker, statin. 3. HTN (hypertension): Blood pressure now elevated. He just increased his metoprolol yesterday. I have asked him to monitor his blood pressure over the next week. If it remains elevated, I would suggest increasing his lisinopril to 10 mg daily. 4. HLD (hyperlipidemia): Continue simvastatin. Check lipids and LFTs in 6 weeks. 5. Disposition: Follow up with one of orr cardiologists in [place] in 6-8 weeks (this is closer to his home).    Signed, [name], MHS 04/06/2014 11:44 AM    [facility], [place], [place]  [address] Phone: [phone]; Fax: [phone]

## 2014-04-08 ENCOUNTER — Emergency Department (HOSPITAL_COMMUNITY)

## 2014-04-08 ENCOUNTER — Observation Stay (HOSPITAL_COMMUNITY)
Admission: EM | Admit: 2014-04-08 | Discharge: 2014-04-09 | Disposition: A | Discharge status: Home / Self Care | Attending: Internal Medicine | Admitting: Internal Medicine

## 2014-04-08 ENCOUNTER — Encounter (HOSPITAL_COMMUNITY): Payer: Self-pay | Admitting: Emergency Medicine

## 2014-04-08 DIAGNOSIS — Z87891 Personal history of nicotine dependence: Secondary | ICD-10-CM | POA: Insufficient documentation

## 2014-04-08 DIAGNOSIS — I252 Old myocardial infarction: Secondary | ICD-10-CM

## 2014-04-08 DIAGNOSIS — I10 Essential (primary) hypertension: Secondary | ICD-10-CM | POA: Insufficient documentation

## 2014-04-08 DIAGNOSIS — K219 Gastro-esophageal reflux disease without esophagitis: Secondary | ICD-10-CM | POA: Insufficient documentation

## 2014-04-08 DIAGNOSIS — I219 Acute myocardial infarction, unspecified: Secondary | ICD-10-CM | POA: Insufficient documentation

## 2014-04-08 DIAGNOSIS — R079 Chest pain, unspecified: Principal | ICD-10-CM | POA: Diagnosis present

## 2014-04-08 DIAGNOSIS — Z905 Acquired absence of kidney: Secondary | ICD-10-CM | POA: Insufficient documentation

## 2014-04-08 DIAGNOSIS — Z85528 Personal history of other malignant neoplasm of kidney: Secondary | ICD-10-CM | POA: Insufficient documentation

## 2014-04-08 DIAGNOSIS — Z7982 Long term (current) use of aspirin: Secondary | ICD-10-CM | POA: Insufficient documentation

## 2014-04-08 DIAGNOSIS — M129 Arthropathy, unspecified: Secondary | ICD-10-CM | POA: Insufficient documentation

## 2014-04-08 DIAGNOSIS — Z7902 Long term (current) use of antithrombotics/antiplatelets: Secondary | ICD-10-CM | POA: Insufficient documentation

## 2014-04-08 DIAGNOSIS — E785 Hyperlipidemia, unspecified: Secondary | ICD-10-CM | POA: Insufficient documentation

## 2014-04-08 DIAGNOSIS — D649 Anemia, unspecified: Secondary | ICD-10-CM

## 2014-04-08 DIAGNOSIS — I2510 Atherosclerotic heart disease of native coronary artery without angina pectoris: Secondary | ICD-10-CM | POA: Insufficient documentation

## 2014-04-08 LAB — CBC WITH DIFFERENTIAL/PLATELET
BASOS ABS: 0 10*3/uL (ref 0.0–0.1)
Basophils Relative: 0 % (ref 0–1)
EOS PCT: 2 % (ref 0–5)
Eosinophils Absolute: 0.2 10*3/uL (ref 0.0–0.7)
HEMATOCRIT: 37.7 % — AB (ref 39.0–52.0)
HEMOGLOBIN: 12.6 g/dL — AB (ref 13.0–17.0)
Lymphocytes Relative: 22 % (ref 12–46)
Lymphs Abs: 1.6 10*3/uL (ref 0.7–4.0)
MCH: 29.9 pg (ref 26.0–34.0)
MCHC: 33.4 g/dL (ref 30.0–36.0)
MCV: 89.3 fL (ref 78.0–100.0)
MONO ABS: 0.4 10*3/uL (ref 0.1–1.0)
Monocytes Relative: 6 % (ref 3–12)
Neutro Abs: 5.1 10*3/uL (ref 1.7–7.7)
Neutrophils Relative %: 70 % (ref 43–77)
Platelets: 210 10*3/uL (ref 150–400)
RBC: 4.22 MIL/uL (ref 4.22–5.81)
RDW: 14.2 % (ref 11.5–15.5)
WBC: 7.4 10*3/uL (ref 4.0–10.5)

## 2014-04-08 LAB — BASIC METABOLIC PANEL
BUN: 17 mg/dL (ref 6–23)
CALCIUM: 8.7 mg/dL (ref 8.4–10.5)
CHLORIDE: 105 meq/L (ref 96–112)
CO2: 22 meq/L (ref 19–32)
CREATININE: 0.92 mg/dL (ref 0.50–1.35)
GFR calc Af Amer: >90 mL/min (ref >90)
GFR calc non Af Amer: >90 mL/min (ref >90)
GLUCOSE: 111 mg/dL — AB (ref 70–99)
Potassium: 4.4 mEq/L (ref 3.7–5.3)
Sodium: 140 mEq/L (ref 137–147)

## 2014-04-08 LAB — TROPONIN I
Troponin I: <0.3 ng/mL (ref <0.30)
Troponin I: <0.3 ng/mL (ref <0.30)
Troponin I: <0.3 ng/mL (ref <0.30)
Troponin I: <0.3 ng/mL (ref <0.30)

## 2014-04-08 MED ORDER — ACETAMINOPHEN 650 MG RE SUPP: 650.0000 mg | Freq: Four times a day (QID) | RECTAL | Status: DC | PRN

## 2014-04-08 MED ORDER — METOPROLOL TARTRATE 25 MG PO TABS
25.0000 mg | ORAL_TABLET | Freq: Two times a day (BID) | ORAL | Status: DC
  Administered 2014-04-08 – 2014-04-09 (×3): 25 mg via ORAL
  Filled 2014-04-08 (×3): qty 1

## 2014-04-08 MED ORDER — SODIUM CHLORIDE 0.9 % IJ SOLN
3.0000 mL | Freq: Two times a day (BID) | INTRAMUSCULAR | Status: DC
  Administered 2014-04-08: 3 mL via INTRAVENOUS
  Administered 2014-04-08: 22:00:00 via INTRAVENOUS
  Administered 2014-04-09: 3 mL via INTRAVENOUS

## 2014-04-08 MED ORDER — SIMVASTATIN 20 MG PO TABS
40.0000 mg | ORAL_TABLET | Freq: Every day | ORAL | Status: DC
  Administered 2014-04-08: 40 mg via ORAL
  Filled 2014-04-08: qty 2

## 2014-04-08 MED ORDER — LISINOPRIL 5 MG PO TABS
5.0000 mg | ORAL_TABLET | Freq: Every day | ORAL | Status: DC
  Administered 2014-04-08 – 2014-04-09 (×2): 5 mg via ORAL
  Filled 2014-04-08 (×2): qty 1

## 2014-04-08 MED ORDER — ASPIRIN EC 81 MG PO TBEC
81.0000 mg | DELAYED_RELEASE_TABLET | Freq: Every day | ORAL | Status: DC
  Administered 2014-04-08 – 2014-04-09 (×2): 81 mg via ORAL
  Filled 2014-04-08 (×2): qty 1

## 2014-04-08 MED ORDER — CLOPIDOGREL BISULFATE 75 MG PO TABS
75.0000 mg | ORAL_TABLET | Freq: Every day | ORAL | Status: DC
  Administered 2014-04-08 – 2014-04-09 (×2): 75 mg via ORAL
  Filled 2014-04-08 (×2): qty 1

## 2014-04-08 MED ORDER — CLOPIDOGREL BISULFATE 75 MG PO TABS: 75.0000 mg | ORAL_TABLET | Freq: Every day | ORAL | Status: DC

## 2014-04-08 MED ORDER — ACETAMINOPHEN 325 MG PO TABS: 650.0000 mg | ORAL_TABLET | Freq: Four times a day (QID) | ORAL | Status: DC | PRN

## 2014-04-08 MED ORDER — ASPIRIN 325 MG PO TABS
325.0000 mg | ORAL_TABLET | ORAL | Status: AC
  Administered 2014-04-08: 325 mg via ORAL
  Filled 2014-04-08: qty 1

## 2014-04-08 MED ORDER — NITROGLYCERIN 0.4 MG SL SUBL: 0.4000 mg | SUBLINGUAL_TABLET | SUBLINGUAL | Status: DC | PRN

## 2014-04-08 MED ORDER — ALPRAZOLAM 0.25 MG PO TABS: 0.2500 mg | ORAL_TABLET | Freq: Three times a day (TID) | ORAL | Status: DC | PRN

## 2014-04-08 MED ORDER — ONDANSETRON HCL 4 MG PO TABS: 4.0000 mg | ORAL_TABLET | Freq: Four times a day (QID) | ORAL | Status: DC | PRN

## 2014-04-08 MED ORDER — ENOXAPARIN SODIUM 40 MG/0.4ML ~~LOC~~ SOLN
40.0000 mg | SUBCUTANEOUS | Status: DC
  Administered 2014-04-08: 40 mg via SUBCUTANEOUS
  Filled 2014-04-08: qty 0.4

## 2014-04-08 MED ORDER — ONDANSETRON HCL 4 MG/2ML IJ SOLN: 4.0000 mg | Freq: Four times a day (QID) | INTRAMUSCULAR | Status: DC | PRN

## 2014-04-08 NOTE — ED Provider Notes (Signed)
CSN: [id number]     Arrival date & time 04/08/14  0413 History   First MD Initiated Contact with Patient 04/08/14 [phone]     Chief Complaint  Patient presents with  . Chest Pain     (Consider location/radiation/quality/duration/timing/severity/associated sxs/prior Treatment) Patient is a 58 y.o. male presenting with chest pain. The history is provided by the patient.  Chest Pain He had onset about 11:30 PM of a tight feeling across his lower chest without radiation. There was no associated dyspnea, and nausea, diaphoresis. He rated the tightness at 3/10. Nothing made it better nothing made it worse. He did not take any nitroglycerin. Tight feeling 1 away just about the time I went in to evaluate the patient and he is now symptom-free. Of note, he was recently hospitalized for a non-STEMI and was found to have an occluded vessel that was not able to be stented, but he did not have bypass surgery.  Past Medical History  Diagnosis Date  . GERD (gastroesophageal reflux disease)   . Arthritis     generalized   . Hypertension   . Coronary artery disease     a. NSTEMI (02/2014):  LHC (02/2014):  Mild disease in LAD and CFX; prox RCA occluded with R-R collats, dist RCA filled by L-R collats, inf HK, EF 55%, LVEDP 15 mmHg.  PCI:  Unsuccessful angioplasty of RCA (late presentation of inf MI) - tx medically.  . Hyperlipidemia   . Renal cell carcinoma of right kidney     s/p partial nephrectomy in 2013  . MI (myocardial infarction) 03/11/14   Past Surgical History  Procedure Laterality Date  . Back surgery      1989  . Tonsillectomy    . Robot assisted laparoscopic nephrectomy  01/14/2012    Procedure: ROBOTIC ASSISTED LAPAROSCOPIC NEPHRECTOMY;  Surgeon: Molli Hazard, MD;  Location: [facility];  Service: Urology;  Laterality: Right;  Robot Laparoscopic Right Partial Nephrectomy     No family history on file. History  Substance Use Topics  . Smoking status: Former Smoker    Quit date:  11/18/1979  . Smokeless tobacco: Never Used  . Alcohol Use: No    Review of Systems  Cardiovascular: Positive for chest pain.  All other systems reviewed and are negative.     Allergies  Review of patient's allergies indicates no known allergies.  Home Medications   Prior to Admission medications   Medication Sig Start Date End Date Taking? Authorizing Provider  aspirin EC 81 MG EC tablet Take 1 tablet (81 mg total) by mouth daily. 03/20/14   Belva Crome III, MD  clopidogrel (PLAVIX) 75 MG tablet Take 1 tablet (75 mg total) by mouth daily with breakfast. 03/20/14   Belva Crome III, MD  lisinopril (PRINIVIL,ZESTRIL) 10 MG tablet Take 1/2 tablet daily 03/24/14   Jettie Booze, MD  metoprolol tartrate (LOPRESSOR) 25 MG tablet Take 1 tablet (25 mg total) by mouth 2 (two) times daily. 03/20/14   Belva Crome III, MD  nitroGLYCERIN (NITROSTAT) 0.4 MG SL tablet Place 1 tablet (0.4 mg total) under the tongue every 5 (five) minutes x 3 doses as needed for chest pain. 03/20/14   Belva Crome III, MD  simvastatin (ZOCOR) 40 MG tablet Take 1 tablet (40 mg total) by mouth daily at 6 PM. 03/20/14   Belva Crome III, MD   BP 126/94  Pulse 58  Temp(Src) 97.6 F (36.4 C) (Oral)  Resp 11  Ht 5'  8" (1.727 m)  Wt 184 lb (83.462 kg)  BMI 27.98 kg/m2  SpO2 100% Physical Exam  Nursing note and vitals reviewed.  58 year old male, resting comfortably and in no acute distress. Vital signs are significant for hypertension with blood pressure 126/94, and bradycardia with heart rate of 58. Oxygen saturation is 100%, which is normal. Head is normocephalic and atraumatic. PERRLA, EOMI. Oropharynx is clear. Neck is nontender and supple without adenopathy or JVD. Back is nontender and there is no CVA tenderness. Lungs are clear without rales, wheezes, or rhonchi. Chest is nontender. Heart has regular rate and rhythm without murmur. Abdomen is soft, flat, nontender without masses or hepatosplenomegaly  and peristalsis is normoactive. Extremities have no cyanosis or edema, full range of motion is present. Skin is warm and dry without rash. Neurologic: Mental status is normal, cranial nerves are intact, there are no motor or sensory deficits.  ED Course  Procedures (including critical care time) Labs Review Results for orders placed during the hospital encounter of 09/02/50  BASIC METABOLIC PANEL      Result Value Ref Range   Sodium 140  137 - 147 mEq/L   Potassium 4.4  3.7 - 5.3 mEq/L   Chloride 105  96 - 112 mEq/L   CO2 22  19 - 32 mEq/L   Glucose, Bld 111 (*) 70 - 99 mg/dL   BUN 17  6 - 23 mg/dL   Creatinine, Ser 0.92  0.50 - 1.35 mg/dL   Calcium 8.7  8.4 - 10.5 mg/dL   GFR calc non Af Amer >90  >90 mL/min   GFR calc Af Amer >90  >90 mL/min  TROPONIN I      Result Value Ref Range   Troponin I <0.30  <0.30 ng/mL  CBC WITH DIFFERENTIAL      Result Value Ref Range   WBC 7.4  4.0 - 10.5 K/uL   RBC 4.22  4.22 - 5.81 MIL/uL   Hemoglobin 12.6 (*) 13.0 - 17.0 g/dL   HCT 37.7 (*) 39.0 - 52.0 %   MCV 89.3  78.0 - 100.0 fL   MCH 29.9  26.0 - 34.0 pg   MCHC 33.4  30.0 - 36.0 g/dL   RDW 14.2  11.5 - 15.5 %   Platelets 210  150 - 400 K/uL   Neutrophils Relative % 70  43 - 77 %   Neutro Abs 5.1  1.7 - 7.7 K/uL   Lymphocytes Relative 22  12 - 46 %   Lymphs Abs 1.6  0.7 - 4.0 K/uL   Monocytes Relative 6  3 - 12 %   Monocytes Absolute 0.4  0.1 - 1.0 K/uL   Eosinophils Relative 2  0 - 5 %   Eosinophils Absolute 0.2  0.0 - 0.7 K/uL   Basophils Relative 0  0 - 1 %   Basophils Absolute 0.0  0.0 - 0.1 K/uL  Imaging Review Dg Chest Port 1 View  04/08/2014   CLINICAL DATA:  Chest pain and pressure.  Hot flashes.  EXAM: PORTABLE CHEST - 1 VIEW  COMPARISON:  Chest radiograph performed 03/16/2014  FINDINGS: The lungs are well-aerated. Mild vascular congestion is noted. There is no evidence of focal opacification, pleural effusion or pneumothorax.  The cardiomediastinal silhouette is within  normal limits. No acute osseous abnormalities are seen.  IMPRESSION: Mild vascular congestion noted; lungs remain grossly clear.   Electronically Signed   By: Garald Balding M.D.   On: 04/08/2014 05:16  EKG Interpretation   Date/Time:  Saturday Apr 08 2014 04:32:06 EDT Ventricular Rate:  70 PR Interval:  125 QRS Duration: 87 QT Interval:  388 QTC Calculation: 419 R Axis:   -13 Text Interpretation:  Sinus rhythm Nonspecific T abnormalities, inferior  leads When compared with ECG of 03/22/2014, No significant change was found  Confirmed by [facility]  MD, Rolfe Hartsell ([id number]) on 04/08/2014 4:37:37 AM      MDM   Final diagnoses:  Chest pain  Anemia  Coronary Artery Disease    Chest discomfort in patient with known coronary artery disease. Old records are reviewed and he had been admitted about 3 weeks ago with weakness and found to have a non-STEMI. On catheterization, he had single-vessel disease of the right coronary artery with unsuccessful attempt to place a stent. He was noted to have collaterals, so it was elected to treat him medically. Current episode is worrisome for angina but initial ECG is unchanged. He will need to be observed with serial troponins. Given his history, I would not feel comfortable discharging him until he had a negative troponin 6 hours after cessation of his chest discomfort.  Initial troponin is normal. He continues to be pain free in the ED. Case is discussed with Dr. [place] Pink of [facility] who agrees to admit the patient under observation status for serial troponins.  [name], MD 42/35/36 1443

## 2014-04-08 NOTE — H&P (Signed)
[facility] History and Physical  ANKUSH GINTZ HBZ:[id number] DOB: 1956-01-16 DOA: 04/08/2014  Referring physician: Dr. Roxanne Mins, ER physician PCP: Rory Percy, MD   Chief Complaint: chest discomfort  HPI: [name] is a 58 y.o. male with history of coronary artery disease and recent non-ST elevation MI with cardiac catheterization done 03/17/14. Patient presents to the hospital with complaints of chest discomfort. Symptoms started last night around 11:30 PM. He does not describe his discomfort his pain or pressure. He is unable to describe his discomfort, but feels that in maybe hot/cold sensations, possibly tingling. He has noted it to be both on the left and right sides of his chest. He has no associated shortness of breath, nausea, dizziness, vomiting or palpitations. He does feel somewhat anxious and feels that this may be contributing to his symptoms. He presents to the emergency room for evaluation where EKG did not show any acute changes and cardiac markers are currently negative. He has had multiple ER visits since his discharge from the hospital. He is being admitted to the hospital for further observation.   Review of Systems:  Pertinent positives as per HPI, otherwise negative.   Past Medical History  Diagnosis Date  . GERD (gastroesophageal reflux disease)   . Arthritis     generalized   . Hypertension   . Coronary artery disease     a. NSTEMI (02/2014):  LHC (02/2014):  Mild disease in LAD and CFX; prox RCA occluded with R-R collats, dist RCA filled by L-R collats, inf HK, EF 55%, LVEDP 15 mmHg.  PCI:  Unsuccessful angioplasty of RCA (late presentation of inf MI) - tx medically.  . Hyperlipidemia   . Renal cell carcinoma of right kidney     s/p partial nephrectomy in 2013  . MI (myocardial infarction) 03/11/14   Past Surgical History  Procedure Laterality Date  . Back surgery      1989  . Tonsillectomy    . Robot assisted laparoscopic nephrectomy  01/14/2012   Procedure: ROBOTIC ASSISTED LAPAROSCOPIC NEPHRECTOMY;  Surgeon: Molli Hazard, MD;  Location: [facility];  Service: Urology;  Laterality: Right;  Robot Laparoscopic Right Partial Nephrectomy     Social History:  reports that he quit smoking about 34 years ago. He has never used smokeless tobacco. He reports that he does not drink alcohol or use illicit drugs.  No Known Allergies  Family history: reviewed. No pertinent family history.   Prior to Admission medications   Medication Sig Start Date End Date Taking? Authorizing Provider  aspirin EC 81 MG EC tablet Take 1 tablet (81 mg total) by mouth daily. 03/20/14   Belva Crome III, MD  clopidogrel (PLAVIX) 75 MG tablet Take 1 tablet (75 mg total) by mouth daily with breakfast. 03/20/14   Belva Crome III, MD  lisinopril (PRINIVIL,ZESTRIL) 10 MG tablet Take 1/2 tablet daily 03/24/14   Jettie Booze, MD  metoprolol tartrate (LOPRESSOR) 25 MG tablet Take 1 tablet (25 mg total) by mouth 2 (two) times daily. 03/20/14   Belva Crome III, MD  nitroGLYCERIN (NITROSTAT) 0.4 MG SL tablet Place 1 tablet (0.4 mg total) under the tongue every 5 (five) minutes x 3 doses as needed for chest pain. 03/20/14   Belva Crome III, MD  simvastatin (ZOCOR) 40 MG tablet Take 1 tablet (40 mg total) by mouth daily at 6 PM. 03/20/14   Sinclair Grooms, MD   Physical Exam: Filed Vitals:   04/08/14 0710  BP: 145/96  Pulse:   Temp: 97.6 F (36.4 C)  Resp: 18    BP 145/96  Pulse 59  Temp(Src) 97.6 F (36.4 C) (Oral)  Resp 18  Ht 5\' 8"  (1.727 m)  Wt 83.462 kg (184 lb)  BMI 27.98 kg/m2  SpO2 100%  General:  NAD, mildly anxious Eyes: PERRL, normal lids, irises & conjunctiva ENT: grossly normal hearing, lips & tongue Neck: no LAD, masses or thyromegaly Cardiovascular: RRR, no m/r/g. No LE edema. Telemetry: SR, no arrhythmias  Respiratory: CTA bilaterally, no w/r/r. Normal respiratory effort. Abdomen: soft, ntnd Skin: no rash or induration seen on  limited exam Musculoskeletal: grossly normal tone BUE/BLE Psychiatric: grossly normal mood and affect, speech fluent and appropriate Neurologic: grossly non-focal.          Labs on Admission:  Basic Metabolic Panel:  Recent Labs Lab 04/08/14 0438  NA 140  K 4.4  CL 105  CO2 22  GLUCOSE 111*  BUN 17  CREATININE 0.92  CALCIUM 8.7   Liver Function Tests: No results found for this basename: AST, ALT, ALKPHOS, BILITOT, PROT, ALBUMIN,  in the last 168 hours No results found for this basename: LIPASE, AMYLASE,  in the last 168 hours No results found for this basename: AMMONIA,  in the last 168 hours CBC:  Recent Labs Lab 04/08/14 0447  WBC 7.4  NEUTROABS 5.1  HGB 12.6*  HCT 37.7*  MCV 89.3  PLT 210   Cardiac Enzymes:  Recent Labs Lab 04/08/14 0438 04/08/14 0750  TROPONINI <0.30 <0.30    BNP (last 3 results) No results found for this basename: PROBNP,  in the last 8760 hours CBG: No results found for this basename: GLUCAP,  in the last 168 hours  Radiological Exams on Admission: Dg Chest Port 1 View  04/08/2014   CLINICAL DATA:  Chest pain and pressure.  Hot flashes.  EXAM: PORTABLE CHEST - 1 VIEW  COMPARISON:  Chest radiograph performed 03/16/2014  FINDINGS: The lungs are well-aerated. Mild vascular congestion is noted. There is no evidence of focal opacification, pleural effusion or pneumothorax.  The cardiomediastinal silhouette is within normal limits. No acute osseous abnormalities are seen.  IMPRESSION: Mild vascular congestion noted; lungs remain grossly clear.   Electronically Signed   By: Garald Balding M.D.   On: 04/08/2014 05:16    EKG: Independently reviewed. T inversions in inferior leads, no change from prior EKG  Assessment/Plan Active Problems:   History of non-ST elevation myocardial infarction (NSTEMI) 02/2014 - treated medically   Coronary Artery Disease   HTN (hypertension)   HLD (hyperlipidemia)   Chest pain   1. Chest discomfort.  Appears to be atypical. No acute changes on EKG and cardiac markers are negative. We'll continue to cycle troponins, monitor on telemetry. Continue the remainder of his cardiac medications. If the patient rules out for MI, will likely need close cardiology followup. I suspect that his symptoms are likely exacerbated by underlying anxiety. 2. Hypertension. We'll continue his outpatient regimen and adjust medications accordingly. 3. Hyperlipidemia. Continue statin. 4. Coronary artery disease. Recent cardiac catheterization on 03/17/14. It was noted at that time the RCA was occluded a stent was not able to be passed. Recommendations were for medical management with aspirin Plavix. It was noted that he had collateral circulation to the distal RCA. He will need close follow up with cardiology once discharged  Code Status: full code Family Communication: discussed with patient Disposition Plan: discharge home when improved  Time spent: 64mins  Kathie Dike [facility] Pager [phone]  **Disclaimer: This note may have been dictated with voice recognition software. Similar sounding words can inadvertently be transcribed and this note may contain transcription errors which may not have been corrected upon publication of note.**

## 2014-04-08 NOTE — ED Notes (Signed)
Onset of chest tightness 11:30pm. Noted increased blood pressure. Now chest tightness 3/10

## 2014-04-08 NOTE — Plan of Care (Signed)
Problem: Phase I Progression Outcomes Goal: Anginal pain relieved Outcome: Adequate for Discharge Patient having short episodes of discomfort, although states it is not pain.

## 2014-04-09 LAB — CBC
HEMATOCRIT: 39.1 % (ref 39.0–52.0)
Hemoglobin: 12.7 g/dL — ABNORMAL LOW (ref 13.0–17.0)
MCH: 29.6 pg (ref 26.0–34.0)
MCHC: 32.5 g/dL (ref 30.0–36.0)
MCV: 91.1 fL (ref 78.0–100.0)
PLATELETS: 206 10*3/uL (ref 150–400)
RBC: 4.29 MIL/uL (ref 4.22–5.81)
RDW: 14.4 % (ref 11.5–15.5)
WBC: 7.6 10*3/uL (ref 4.0–10.5)

## 2014-04-09 LAB — BASIC METABOLIC PANEL
BUN: 15 mg/dL (ref 6–23)
CALCIUM: 9.1 mg/dL (ref 8.4–10.5)
CHLORIDE: 105 meq/L (ref 96–112)
CO2: 26 mEq/L (ref 19–32)
Creatinine, Ser: 1.03 mg/dL (ref 0.50–1.35)
GFR calc Af Amer: >90 mL/min (ref >90)
GFR calc non Af Amer: 78 mL/min — ABNORMAL LOW (ref >90)
Glucose, Bld: 108 mg/dL — ABNORMAL HIGH (ref 70–99)
Potassium: 4.1 mEq/L (ref 3.7–5.3)
Sodium: 141 mEq/L (ref 137–147)

## 2014-04-09 NOTE — Discharge Summary (Signed)
Physician Discharge Summary  [name] YNW:[id number] DOB: 1956-06-02 DOA: 04/08/2014  PCP: Rory Percy, MD  Admit date: 04/08/2014 Discharge date: 04/09/2014  Time spent: 40 minutes  Recommendations for Outpatient Follow-up:  1. Patient will follow up with cardiology.  He has an appointment in the next 6 weeks, but we will try and get this expedited. 2. Follow up with primary care doctor in 2 weeks  Discharge Diagnoses:  Active Problems:   History of non-ST elevation myocardial infarction (NSTEMI) 02/2014 - treated medically   Coronary Artery Disease   HTN (hypertension)   HLD (hyperlipidemia)   Chest pain   Discharge Condition: improved  Diet recommendation: low salt  Filed Weights   04/08/14 0437  Weight: 83.462 kg (184 lb)    History of present illness:  [name] is a 58 y.o. male with history of coronary artery disease and recent non-ST elevation MI with cardiac catheterization done 03/17/14. Patient presents to the hospital with complaints of chest discomfort. Symptoms started last night around 11:30 PM. He does not describe his discomfort his pain or pressure. He is unable to describe his discomfort, but feels that in maybe hot/cold sensations, possibly tingling. He has noted it to be both on the left and right sides of his chest. He has no associated shortness of breath, nausea, dizziness, vomiting or palpitations. He does feel somewhat anxious and feels that this may be contributing to his symptoms. He presents to the emergency room for evaluation where EKG did not show any acute changes and cardiac markers are currently negative. He has had multiple ER visits since his discharge from the hospital. He is being admitted to the hospital for further observation.   Hospital Course:  This patient was admitted to the hospital with chest discomfort.  He ruled out for ACS with negative cardiac markers and no changes on EKG.  He did not have any recurrence in pain in almost  24 hours. He has recently had an extensive cardiac evaluation when he had a NSTEMI and had cardiac cath on 5/1. I feel that a component of his discomfort is exacerbated by anxiety. It does not appear that his current presentation is cardiac related.  He is ambulating without difficulty and is ready for discharge home.  He has follow up with cardiology scheduled, but we will try and expedite this.  He will discuss any anxiety medications with his primary care doctor.  Procedures:    Consultations:    Discharge Exam: Filed Vitals:   04/09/14 0404  BP: 128/88  Pulse: 61  Temp: 97.7 F (36.5 C)  Resp:     General: NAD Cardiovascular: S1, S2 RRR Respiratory: CTA B  Discharge Instructions You were cared for by a hospitalist during your hospital stay. If you have any questions about your discharge medications or the care you received while you were in the hospital after you are discharged, you can call the unit and asked to speak with the hospitalist on call if the hospitalist that took care of you is not available. Once you are discharged, your primary care physician will handle any further medical issues. Please note that NO REFILLS for any discharge medications will be authorized once you are discharged, as it is imperative that you return to your primary care physician (or establish a relationship with a primary care physician if you do not have one) for your aftercare needs so that they can reassess your need for medications and monitor your lab values.  Discharge  Instructions   Call MD for:  difficulty breathing, headache or visual disturbances    Complete by:  As directed      Call MD for:  persistant nausea and vomiting    Complete by:  As directed      Call MD for:  severe uncontrolled pain    Complete by:  As directed      Diet - low sodium heart healthy    Complete by:  As directed      Increase activity slowly    Complete by:  As directed             Medication List          aspirin 81 MG EC tablet  Take 1 tablet (81 mg total) by mouth daily.     clopidogrel 75 MG tablet  Commonly known as:  PLAVIX  Take 1 tablet (75 mg total) by mouth daily with breakfast.     lisinopril 10 MG tablet  Commonly known as:  PRINIVIL,ZESTRIL  Take 5 mg by mouth daily. Take 1/2 tablet daily     metoprolol tartrate 25 MG tablet  Commonly known as:  LOPRESSOR  Take 1 tablet (25 mg total) by mouth 2 (two) times daily.     nitroGLYCERIN 0.4 MG SL tablet  Commonly known as:  NITROSTAT  Place 1 tablet (0.4 mg total) under the tongue every 5 (five) minutes x 3 doses as needed for chest pain.     simvastatin 40 MG tablet  Commonly known as:  ZOCOR  Take 1 tablet (40 mg total) by mouth daily at 6 PM.       No Known Allergies     Follow-up Information   Follow up with Rory Percy, MD. Schedule an appointment as soon as possible for a visit in 1 week.   Specialty:  Family Medicine   Contact information:   [address] [address] [phone]       Follow up with cardiology will call you with an appointment.       The results of significant diagnostics from this hospitalization (including imaging, microbiology, ancillary and laboratory) are listed below for reference.    Significant Diagnostic Studies: Dg Chest Port 1 View  04/08/2014   CLINICAL DATA:  Chest pain and pressure.  Hot flashes.  EXAM: PORTABLE CHEST - 1 VIEW  COMPARISON:  Chest radiograph performed 03/16/2014  FINDINGS: The lungs are well-aerated. Mild vascular congestion is noted. There is no evidence of focal opacification, pleural effusion or pneumothorax.  The cardiomediastinal silhouette is within normal limits. No acute osseous abnormalities are seen.  IMPRESSION: Mild vascular congestion noted; lungs remain grossly clear.   Electronically Signed   By: Garald Balding M.D.   On: 04/08/2014 05:16   Dg Chest Port 1 View  03/16/2014   CLINICAL DATA:  Chest pain.  EXAM: PORTABLE CHEST - 1  VIEW  COMPARISON:  09/12/2013  FINDINGS: Pulmonary hyperinflation again noted. Both lungs remain clear. No evidence of pneumothorax or pleural effusion. Heart size is within normal limits.  IMPRESSION: Pulmonary hyperinflation.  No active disease.   Electronically Signed   By: Earle Gell M.D.   On: 03/16/2014 14:05    Microbiology: No results found for this or any previous visit (from the past 240 hour(s)).   Labs: Basic Metabolic Panel:  Recent Labs Lab 04/08/14 0438 04/09/14 0448  NA 140 141  K 4.4 4.1  CL 105 105  CO2 22 26  GLUCOSE 111* 108*  BUN 17 15  CREATININE 0.92 1.03  CALCIUM 8.7 9.1   Liver Function Tests: No results found for this basename: AST, ALT, ALKPHOS, BILITOT, PROT, ALBUMIN,  in the last 168 hours No results found for this basename: LIPASE, AMYLASE,  in the last 168 hours No results found for this basename: AMMONIA,  in the last 168 hours CBC:  Recent Labs Lab 04/08/14 0447 04/09/14 0448  WBC 7.4 7.6  NEUTROABS 5.1  --   HGB 12.6* 12.7*  HCT 37.7* 39.1  MCV 89.3 91.1  PLT 210 206   Cardiac Enzymes:  Recent Labs Lab 04/08/14 0438 04/08/14 0750 04/08/14 1405 04/08/14 1936  TROPONINI <0.30 <0.30 <0.30 <0.30   BNP: BNP (last 3 results) No results found for this basename: PROBNP,  in the last 8760 hours CBG: No results found for this basename: GLUCAP,  in the last 168 hours     Signed:  Kathie Dike  [facility] 04/09/2014, 10:22 AM

## 2014-04-09 NOTE — Progress Notes (Signed)
Patient states understanding of discharge instructions.  

## 2014-04-09 NOTE — Discharge Instructions (Signed)
Chest Pain (Nonspecific) °It is often hard to give a specific diagnosis for the cause of chest pain. There is always a chance that your pain could be related to something serious, such as a heart attack or a blood clot in the lungs. You need to follow up with your caregiver for further evaluation. °CAUSES  °· Heartburn. °· Pneumonia or bronchitis. °· Anxiety or stress. °· Inflammation around your heart (pericarditis) or lung (pleuritis or pleurisy). °· A blood clot in the lung. °· A collapsed lung (pneumothorax). It can develop suddenly on its own (spontaneous pneumothorax) or from injury (trauma) to the chest. °· Shingles infection (herpes zoster virus). °The chest wall is composed of bones, muscles, and cartilage. Any of these can be the source of the pain. °· The bones can be bruised by injury. °· The muscles or cartilage can be strained by coughing or overwork. °· The cartilage can be affected by inflammation and become sore (costochondritis). °DIAGNOSIS  °Lab tests or other studies, such as X-rays, electrocardiography, stress testing, or cardiac imaging, may be needed to find the cause of your pain.  °TREATMENT  °· Treatment depends on what may be causing your chest pain. Treatment may include: °· Acid blockers for heartburn. °· Anti-inflammatory medicine. °· Pain medicine for inflammatory conditions. °· Antibiotics if an infection is present. °· You may be advised to change lifestyle habits. This includes stopping smoking and avoiding alcohol, caffeine, and chocolate. °· You may be advised to keep your head raised (elevated) when sleeping. This reduces the chance of acid going backward from your stomach into your esophagus. °· Most of the time, nonspecific chest pain will improve within 2 to 3 days with rest and mild pain medicine. °HOME CARE INSTRUCTIONS  °· If antibiotics were prescribed, take your antibiotics as directed. Finish them even if you start to feel better. °· For the next few days, avoid physical  activities that bring on chest pain. Continue physical activities as directed. °· Do not smoke. °· Avoid drinking alcohol. °· Only take over-the-counter or prescription medicine for pain, discomfort, or fever as directed by your caregiver. °· Follow your caregiver's suggestions for further testing if your chest pain does not go away. °· Keep any follow-up appointments you made. If you do not go to an appointment, you could develop lasting (chronic) problems with pain. If there is any problem keeping an appointment, you must call to reschedule. °SEEK MEDICAL CARE IF:  °· You think you are having problems from the medicine you are taking. Read your medicine instructions carefully. °· Your chest pain does not go away, even after treatment. °· You develop a rash with blisters on your chest. °SEEK IMMEDIATE MEDICAL CARE IF:  °· You have increased chest pain or pain that spreads to your arm, neck, jaw, back, or abdomen. °· You develop shortness of breath, an increasing cough, or you are coughing up blood. °· You have severe back or abdominal pain, feel nauseous, or vomit. °· You develop severe weakness, fainting, or chills. °· You have a fever. °THIS IS AN EMERGENCY. Do not wait to see if the pain will go away. Get medical help at once. Call your local emergency services (911 in [place] Do not drive yourself to the hospital. °MAKE SURE YOU:  °· Understand these instructions. °· Will watch your condition. °· Will get help right away if you are not doing well or get worse. °Document Released: 08/13/2005 Document Revised: 01/26/2012 Document Reviewed: 06/08/2008 °[employer]® Patient Information ©2014 [employer]   [employer]. ° °

## 2014-04-17 ENCOUNTER — Other Ambulatory Visit: Payer: Self-pay | Admitting: Interventional Cardiology

## 2014-04-19 ENCOUNTER — Encounter: Payer: Self-pay | Admitting: Cardiology

## 2014-04-19 ENCOUNTER — Other Ambulatory Visit: Payer: Self-pay | Admitting: Physician Assistant

## 2014-04-19 DIAGNOSIS — R072 Precordial pain: Secondary | ICD-10-CM | POA: Insufficient documentation

## 2014-04-19 LAB — HEPATIC FUNCTION PANEL
ALBUMIN: 4.3 g/dL (ref 3.5–5.2)
ALT: 48 U/L (ref 0–53)
AST: 40 U/L — AB (ref 0–37)
Alkaline Phosphatase: 71 U/L (ref 39–117)
BILIRUBIN TOTAL: 0.6 mg/dL (ref 0.2–1.2)
Bilirubin, Direct: 0.1 mg/dL (ref 0.0–0.3)
Indirect Bilirubin: 0.5 mg/dL (ref 0.2–1.2)
Total Protein: 6.6 g/dL (ref 6.0–8.3)

## 2014-04-19 LAB — LIPID PANEL
CHOL/HDL RATIO: 3.7 ratio
CHOLESTEROL: 157 mg/dL (ref 0–200)
HDL: 42 mg/dL (ref >39)
LDL Cholesterol: 90 mg/dL (ref 0–99)
Triglycerides: 124 mg/dL (ref <150)
VLDL: 25 mg/dL (ref 0–40)

## 2014-04-19 NOTE — Progress Notes (Signed)
Clinical Summary Mr. Ostermiller is a 58 y.o.male just seen in the office by Mr. Jorene Minors on May 21st. Cardiac history is reviewed below. This is my first meeting with him. Record review finds hospitalization subsequently on May 24th with chest pain. He ruled out for myocardial infarction, ECG showed no acute ST segment changes. He was managed on the hospitalist team, and it was felt that potentially there was a component of anxiety to the patient's symptoms. He was discharged and a close followup office visit was scheduled.  Patient underwent recent cardiac catheterization by Dr. Irish Lack in late April, having presented with an NSTEMI. He had an occluded proximal RCA with left to right and right to right collaterals, underwent attempt at PTCA of the proximal RCA without success, and medical therapy was recommended. LVEF normal at 55%.  Recent lab work showed potassium 4.1, BUN 15, creatinine 1.0, hemoglobin 12.7, platelets 206 troponin I negative. Chest x-ray reported mild vascular congestion.  Today we discussed his cardiac catheterization results. He states he is feeling better, tolerating the current regimen, blood pressure has been coming down as well. He has not been able to take a long-acting nitrate, previously with symptomatic hypotension. 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   No Known Allergies  Current Outpatient Prescriptions  Medication Sig Dispense Refill  . aspirin EC 81 MG EC tablet Take 1 tablet (81 mg total) by mouth daily.      . clopidogrel (PLAVIX) 75 MG tablet Take 1 tablet (75 mg total) by mouth daily with breakfast.  30 tablet  3  . lisinopril (PRINIVIL,ZESTRIL) 10 MG tablet Take 10 mg by mouth daily.      . metoprolol tartrate (LOPRESSOR) 25 MG tablet Take 1 tablet (25 mg total) by mouth 2 (two) times daily.  60 tablet  11  . nitroGLYCERIN (NITROSTAT) 0.4 MG SL tablet Place 1 tablet (0.4 mg total) under the tongue every 5  (five) minutes x 3 doses as needed for chest pain.  25 tablet  12  . simvastatin (ZOCOR) 40 MG tablet Take 1 tablet (40 mg total) by mouth daily at 6 PM.  30 tablet  11  . [DISCONTINUED] isosorbide mononitrate (IMDUR) 30 MG 24 hr tablet Take 1 tablet (30 mg total) by mouth daily.  30 tablet  3  . [DISCONTINUED] lisinopril-hydrochlorothiazide (PRINZIDE,ZESTORETIC) 10-12.5 MG per tablet Take 1 tablet by mouth daily.       No current facility-administered medications for this visit.    Past Medical History  Diagnosis Date  . GERD (gastroesophageal reflux disease)   . Arthritis   . Essential hypertension, benign   . Coronary atherosclerosis of native coronary artery     a. NSTEMI (02/2014):  LHC (02/2014):  Mild disease in LAD and CFX; prox RCA occluded with R-R collats, dist RCA filled by L-R collats, inf HK, EF 55%, LVEDP 15 mmHg.  PCI:  Unsuccessful angioplasty of RCA (late presentation of inf MI) - tx medically.  . Hyperlipidemia   . Renal cell carcinoma of right kidney     Partial nephrectomy in 2013  . NSTEMI (non-ST elevated myocardial infarction) 03/11/14    Social History Mr. Dresser reports that he quit smoking about 34 years ago. His smoking use included Cigarettes. He smoked 0.00 packs per day. He has never used smokeless tobacco. Mr. Wendorf reports that he does not drink alcohol.  Review of Systems No palpitations, dizziness, syncope. Feels anxious at times.  Otherwise negative.  Physical Examination Filed Vitals:   04/20/14 1447  BP: 103/69  Pulse: 76   Filed Weights   04/20/14 1447  Weight: 184 lb (83.462 kg)   Patient in no distress. HEENT: Conjunctiva and lids normal, oropharynx clear. Neck: Supple, no elevated JVP or carotid bruits, no thyromegaly. Lungs: Clear to auscultation, nonlabored breathing at rest. Cardiac: Regular rate and rhythm, no S3 or significant systolic murmur, no pericardial rub. Abdomen: Soft, nontender, bowel sounds present, no guarding or  rebound. Extremities: No pitting edema, distal pulses 2+. Skin: Warm and dry. Musculoskeletal: No kyphosis. Neuropsychiatric: Alert and oriented x3, affect grossly appropriate.   Problem List and Plan   Coronary atherosclerosis of native coronary artery At this point symptomatically stable, coronary anatomy outlined above. He has an occluded proximal RCA, although collateral flow noted. Plan to continue medical therapy, remain active with exercise. We also discussed diet and sodium restriction. Followup arranged in the next 6 weeks.  Essential hypertension, benign Blood pressure is normal today.  HLD (hyperlipidemia) Continues on Zocor.    Satira Sark, M.D., F.A.C.C.

## 2014-04-20 ENCOUNTER — Encounter: Payer: Self-pay | Admitting: Cardiology

## 2014-04-20 ENCOUNTER — Ambulatory Visit (INDEPENDENT_AMBULATORY_CARE_PROVIDER_SITE_OTHER): Admitting: Cardiology

## 2014-04-20 VITALS — BP 103/69 | HR 76 | Ht 68.0 in | Wt 184.0 lb

## 2014-04-20 DIAGNOSIS — E785 Hyperlipidemia, unspecified: Secondary | ICD-10-CM

## 2014-04-20 DIAGNOSIS — I10 Essential (primary) hypertension: Secondary | ICD-10-CM

## 2014-04-20 DIAGNOSIS — I2510 Atherosclerotic heart disease of native coronary artery without angina pectoris: Secondary | ICD-10-CM

## 2014-04-20 NOTE — Assessment & Plan Note (Signed)
Continues on Zocor.

## 2014-04-20 NOTE — Assessment & Plan Note (Signed)
Blood pressure is normal today. 

## 2014-04-20 NOTE — Assessment & Plan Note (Signed)
At this point symptomatically stable, coronary anatomy outlined above. He has an occluded proximal RCA, although collateral flow noted. Plan to continue medical therapy, remain active with exercise. We also discussed diet and sodium restriction. Followup arranged in the next 6 weeks.

## 2014-04-20 NOTE — Patient Instructions (Signed)
Your physician recommends that you schedule a follow-up appointment in: 6 weeks. Your physician recommends that you continue on your current medications as directed. Please refer to the Current Medication list given to you today. 

## 2014-04-21 ENCOUNTER — Telehealth: Payer: Self-pay | Admitting: *Deleted

## 2014-04-21 DIAGNOSIS — I2510 Atherosclerotic heart disease of native coronary artery without angina pectoris: Secondary | ICD-10-CM

## 2014-04-21 DIAGNOSIS — E785 Hyperlipidemia, unspecified: Secondary | ICD-10-CM

## 2014-04-21 NOTE — Telephone Encounter (Signed)
lmptcb x 2 for lab results 

## 2014-04-21 NOTE — Telephone Encounter (Signed)
lmptcb for lab results 

## 2014-04-24 ENCOUNTER — Encounter (HOSPITAL_COMMUNITY): Payer: Self-pay | Admitting: Emergency Medicine

## 2014-04-24 ENCOUNTER — Encounter: Payer: Self-pay | Admitting: *Deleted

## 2014-04-24 ENCOUNTER — Emergency Department (HOSPITAL_COMMUNITY)

## 2014-04-24 ENCOUNTER — Emergency Department (HOSPITAL_COMMUNITY)
Admission: EM | Admit: 2014-04-24 | Discharge: 2014-04-25 | Disposition: A | Discharge status: Home / Self Care | Attending: Emergency Medicine | Admitting: Emergency Medicine

## 2014-04-24 DIAGNOSIS — Z87891 Personal history of nicotine dependence: Secondary | ICD-10-CM | POA: Insufficient documentation

## 2014-04-24 DIAGNOSIS — I10 Essential (primary) hypertension: Secondary | ICD-10-CM | POA: Insufficient documentation

## 2014-04-24 DIAGNOSIS — I252 Old myocardial infarction: Secondary | ICD-10-CM | POA: Insufficient documentation

## 2014-04-24 DIAGNOSIS — Z79899 Other long term (current) drug therapy: Secondary | ICD-10-CM | POA: Insufficient documentation

## 2014-04-24 DIAGNOSIS — I2510 Atherosclerotic heart disease of native coronary artery without angina pectoris: Secondary | ICD-10-CM | POA: Insufficient documentation

## 2014-04-24 DIAGNOSIS — M129 Arthropathy, unspecified: Secondary | ICD-10-CM | POA: Insufficient documentation

## 2014-04-24 DIAGNOSIS — E785 Hyperlipidemia, unspecified: Secondary | ICD-10-CM | POA: Insufficient documentation

## 2014-04-24 DIAGNOSIS — Z7982 Long term (current) use of aspirin: Secondary | ICD-10-CM | POA: Insufficient documentation

## 2014-04-24 DIAGNOSIS — Z7902 Long term (current) use of antithrombotics/antiplatelets: Secondary | ICD-10-CM | POA: Insufficient documentation

## 2014-04-24 DIAGNOSIS — Z85528 Personal history of other malignant neoplasm of kidney: Secondary | ICD-10-CM | POA: Insufficient documentation

## 2014-04-24 DIAGNOSIS — Z8719 Personal history of other diseases of the digestive system: Secondary | ICD-10-CM | POA: Insufficient documentation

## 2014-04-24 DIAGNOSIS — R002 Palpitations: Secondary | ICD-10-CM | POA: Insufficient documentation

## 2014-04-24 LAB — COMPREHENSIVE METABOLIC PANEL
ALBUMIN: 3.8 g/dL (ref 3.5–5.2)
ALT: 49 U/L (ref 0–53)
AST: 29 U/L (ref 0–37)
Alkaline Phosphatase: 92 U/L (ref 39–117)
BUN: 22 mg/dL (ref 6–23)
CO2: 23 mEq/L (ref 19–32)
Calcium: 9.3 mg/dL (ref 8.4–10.5)
Chloride: 104 mEq/L (ref 96–112)
Creatinine, Ser: 0.94 mg/dL (ref 0.50–1.35)
GFR calc Af Amer: >90 mL/min (ref >90)
GFR calc non Af Amer: >90 mL/min (ref >90)
Glucose, Bld: 146 mg/dL — ABNORMAL HIGH (ref 70–99)
POTASSIUM: 3.9 meq/L (ref 3.7–5.3)
SODIUM: 143 meq/L (ref 137–147)
Total Bilirubin: 0.2 mg/dL — ABNORMAL LOW (ref 0.3–1.2)
Total Protein: 6.9 g/dL (ref 6.0–8.3)

## 2014-04-24 LAB — CBC
HCT: 40.3 % (ref 39.0–52.0)
Hemoglobin: 14 g/dL (ref 13.0–17.0)
MCH: 31.3 pg (ref 26.0–34.0)
MCHC: 34.7 g/dL (ref 30.0–36.0)
MCV: 90 fL (ref 78.0–100.0)
Platelets: 225 10*3/uL (ref 150–400)
RBC: 4.48 MIL/uL (ref 4.22–5.81)
RDW: 14.1 % (ref 11.5–15.5)
WBC: 7.4 10*3/uL (ref 4.0–10.5)

## 2014-04-24 LAB — TROPONIN I: Troponin I: <0.3 ng/mL (ref <0.30)

## 2014-04-24 NOTE — ED Notes (Signed)
Pt c/o chest pain that feels warm. Pt has hx of mi.

## 2014-04-24 NOTE — ED Provider Notes (Signed)
CSN: [id number]     Arrival date & time 04/24/14  2213 History   First MD Initiated Contact with Patient 04/24/14 2259  This chart was scribed for Curtis Fuel, MD by Anastasia Pall, ED Scribe. This patient was seen in room APA02/APA02 and the patient's care was started at 11:06 PM.    Chief Complaint  Patient presents with  . Chest Pain   (Consider location/radiation/quality/duration/timing/severity/associated sxs/prior Treatment) The history is provided by the patient. No language interpreter was used.   HPI Comments: [name] is a 58 y.o. male with h/o NSTEMI, who presents to the Emergency Department complaining of acute onset of palpitations, onset earlier this evening while he was relaxing at home watching baseball. He denies being anxious while watching the game. He states his episode lasted about 30 minutes. He states his heart rate went up to 165 via his BP machine at home. He took Nitroglycerin with relief, stating his BP gradually returned to normal since taking the medication. He denies chest pain, trouble breathing, nausea, diaphoresis, light-headedness, dizziness, weakness, and any other associated symptoms.  PCP - Rory Percy, MD  Past Medical History  Diagnosis Date  . GERD (gastroesophageal reflux disease)   . Arthritis   . Essential hypertension, benign   . Coronary atherosclerosis of native coronary artery     a. NSTEMI (02/2014):  LHC (02/2014):  Mild disease in LAD and CFX; prox RCA occluded with R-R collats, dist RCA filled by L-R collats, inf HK, EF 55%, LVEDP 15 mmHg.  PCI:  Unsuccessful angioplasty of RCA (late presentation of inf MI) - tx medically.  . Hyperlipidemia   . Renal cell carcinoma of right kidney     Partial nephrectomy in 2013  . NSTEMI (non-ST elevated myocardial infarction) 03/11/14   Past Surgical History  Procedure Laterality Date  . Back surgery      1989  . Tonsillectomy    . Robot assisted laparoscopic nephrectomy  01/14/2012    Procedure:  ROBOTIC ASSISTED LAPAROSCOPIC NEPHRECTOMY;  Surgeon: Molli Hazard, MD;  Location: [facility];  Service: Urology;  Laterality: Right;  Robot Laparoscopic Right Partial Nephrectomy     History reviewed. No pertinent family history. History  Substance Use Topics  . Smoking status: Former Smoker    Types: Cigarettes    Quit date: 11/18/1979  . Smokeless tobacco: Never Used  . Alcohol Use: No    Review of Systems  All other systems reviewed and are negative.  Allergies  Review of patient's allergies indicates no known allergies.  Home Medications   Prior to Admission medications   Medication Sig Start Date End Date Taking? Authorizing Provider  aspirin EC 81 MG EC tablet Take 1 tablet (81 mg total) by mouth daily. 03/20/14   Belva Crome III, MD  clopidogrel (PLAVIX) 75 MG tablet Take 1 tablet (75 mg total) by mouth daily with breakfast. 03/20/14   Belva Crome III, MD  lisinopril (PRINIVIL,ZESTRIL) 10 MG tablet Take 10 mg by mouth daily.    Satira Sark, MD  metoprolol tartrate (LOPRESSOR) 25 MG tablet Take 1 tablet (25 mg total) by mouth 2 (two) times daily. 03/20/14   Belva Crome III, MD  nitroGLYCERIN (NITROSTAT) 0.4 MG SL tablet Place 1 tablet (0.4 mg total) under the tongue every 5 (five) minutes x 3 doses as needed for chest pain. 03/20/14   Belva Crome III, MD  simvastatin (ZOCOR) 40 MG tablet Take 1 tablet (40 mg total) by mouth  daily at 6 PM. 03/20/14   Belva Crome III, MD   BP 131/91  Pulse 82  Temp(Src) 98.3 F (36.8 C) (Oral)  Resp 20  Ht 5\' 8"  (1.727 m)  Wt 184 lb (83.462 kg)  BMI 27.98 kg/m2  SpO2 99% Physical Exam  Nursing note and vitals reviewed. Constitutional: He is oriented to person, place, and time. He appears well-developed and well-nourished. No distress.  HENT:  Head: Normocephalic and atraumatic.  Eyes: Conjunctivae and EOM are normal. Pupils are equal, round, and reactive to light.  Neck: Normal range of motion. No JVD present. No tracheal  deviation present.  Cardiovascular: Normal rate, regular rhythm and normal heart sounds.   No murmur heard. Pulmonary/Chest: Effort normal and breath sounds normal. No respiratory distress. He has no wheezes. He has no rales. He exhibits no tenderness.  Abdominal: Soft. Bowel sounds are normal. He exhibits no mass.  Musculoskeletal: Normal range of motion. He exhibits no edema.  Lymphadenopathy:    He has no cervical adenopathy.  Neurological: He is alert and oriented to person, place, and time. He has normal reflexes. No cranial nerve deficit. Coordination normal.  Skin: Skin is warm and dry. No rash noted.  Psychiatric: He has a normal mood and affect. His behavior is normal. Thought content normal.    ED Course  Procedures (including critical care time)  DIAGNOSTIC STUDIES: Oxygen Saturation is 99% on room air, normal by my interpretation.    COORDINATION OF CARE: 11:12 PM- Discussed doubt of heart attack with pt. Discussed negative imaging and lab results with pt. Will discharge pt after repeating enzymes and monitoring, and pt complied.    Results for orders placed during the hospital encounter of 04/24/14  CBC      Result Value Ref Range   WBC 7.4  4.0 - 10.5 K/uL   RBC 4.48  4.22 - 5.81 MIL/uL   Hemoglobin 14.0  13.0 - 17.0 g/dL   HCT 40.3  39.0 - 52.0 %   MCV 90.0  78.0 - 100.0 fL   MCH 31.3  26.0 - 34.0 pg   MCHC 34.7  30.0 - 36.0 g/dL   RDW 14.1  11.5 - 15.5 %   Platelets 225  150 - 400 K/uL  COMPREHENSIVE METABOLIC PANEL      Result Value Ref Range   Sodium 143  137 - 147 mEq/L   Potassium 3.9  3.7 - 5.3 mEq/L   Chloride 104  96 - 112 mEq/L   CO2 23  19 - 32 mEq/L   Glucose, Bld 146 (*) 70 - 99 mg/dL   BUN 22  6 - 23 mg/dL   Creatinine, Ser 0.94  0.50 - 1.35 mg/dL   Calcium 9.3  8.4 - 10.5 mg/dL   Total Protein 6.9  6.0 - 8.3 g/dL   Albumin 3.8  3.5 - 5.2 g/dL   AST 29  0 - 37 U/L   ALT 49  0 - 53 U/L   Alkaline Phosphatase 92  39 - 117 U/L   Total  Bilirubin 0.2 (*) 0.3 - 1.2 mg/dL   GFR calc non Af Amer >90  >90 mL/min   GFR calc Af Amer >90  >90 mL/min  TROPONIN I      Result Value Ref Range   Troponin I <0.30  <0.30 ng/mL  TROPONIN I      Result Value Ref Range   Troponin I <0.30  <0.30 ng/mL   Dg Chest 2 View  04/24/2014   CLINICAL DATA:  Chest heaviness and increased heart rate. History of right renal cell carcinoma.  EXAM: CHEST  2 VIEW  COMPARISON:  04/08/2014  FINDINGS: The heart size and mediastinal contours are within normal limits. Both lungs are clear. The visualized skeletal structures are unremarkable.  IMPRESSION: No active cardiopulmonary disease.   Electronically Signed   By: Lucienne Capers M.D.   On: 04/24/2014 23:02    EKG Interpretation   Date/Time:  Monday April 24 2014 22:29:12 EDT Ventricular Rate:  82 PR Interval:  131 QRS Duration: 78 QT Interval:  356 QTC Calculation: 416 R Axis:   1 Text Interpretation:  Sinus rhythm Probable left atrial enlargement  Nonspecific T abnormalities, inferior leads Confirmed by COOK  MD, BRIAN  [phone]) on 04/24/2014 10:53:51 PM     I have reviewed the above noted ECG. It was compared with ECG done on 04/09/2014 and there were no significant changes. MDM   Final diagnoses:  Palpitations    Episode of palpitations without chest pain. Old records are reviewed and he did have a non-STEMI several months ago. However, there was no chest pain associated with this episode of palpitations. EMS rhythm strip shows supraventricular tachycardia at 150 beats per minute with suggestion of flutter waves. I suspect that he had paroxysmal atrial flutter but cannot be 100% sure. He is observed in the ED and troponin was repeated and is negative. He will need followup with his PCP and/or cardiologist to consider whether he should have an event monitor to definitely identify what is causing his palpitations.  I personally performed the services described in this documentation, which was  scribed in my presence. The recorded information has been reviewed and is accurate.     Curtis Fuel, MD 72/62/03 [id number]

## 2014-04-24 NOTE — Telephone Encounter (Signed)
lmptcb x 3 . I will send out a letter to pt tcb to discuss results and med changes.

## 2014-04-25 ENCOUNTER — Telehealth: Payer: Self-pay | Admitting: *Deleted

## 2014-04-25 ENCOUNTER — Other Ambulatory Visit: Payer: Self-pay | Admitting: *Deleted

## 2014-04-25 DIAGNOSIS — R002 Palpitations: Secondary | ICD-10-CM

## 2014-04-25 LAB — TROPONIN I: Troponin I: <0.3 ng/mL (ref <0.30)

## 2014-04-25 MED ORDER — ATORVASTATIN CALCIUM 80 MG PO TABS: 80.0000 mg | ORAL_TABLET | Freq: Every day | ORAL | Status: DC

## 2014-04-25 NOTE — ED Notes (Signed)
MD at bedside. 

## 2014-04-25 NOTE — Telephone Encounter (Signed)
Patient informed that Loletha Grayer will be contacting him about this monitor.

## 2014-04-25 NOTE — Telephone Encounter (Signed)
Message copied by Merlene Laughter on Tue Apr 25, 2014  3:14 PM ------      Message from: Satira Sark      Created: Tue Apr 25, 2014 12:44 PM       I received a telephone call from Dr. Nadara Mustard today regarding [name]. He is concerned that the patient may be having intermittent arrhythmias manifested as palpitations and chest pain based on discussion at office visit today. Can we please schedule a 21 day event recorder to further evaluate palpitations? Please call patient and have this arranged. ------

## 2014-04-25 NOTE — Telephone Encounter (Signed)
ptcb and has been notified about lab results for L/L ordered from 04/06/14 appt w/Scott W. PA. Per PA d/c simvastatin and to start lipitor 80 mg w/FLP/LFT in 3 months. Pt states he was in ED last night w/elevated heart rate, advised to call Dr. Domenic Polite

## 2014-04-25 NOTE — Discharge Instructions (Signed)
The rhythm strip from when the events came was either paroxysmal supraventricular tachycardia or atrial flutter. Please discuss with your primary care provider or cardiologist whether you should have an event monitor done to definitely identify what the rhythm is when it appears.  Palpitations  A palpitation is the feeling that your heartbeat is irregular or is faster than normal. It may feel like your heart is fluttering or skipping a beat. Palpitations are usually not a serious problem. However, in some cases, you may need further medical evaluation. CAUSES  Palpitations can be caused by:  Smoking.  Caffeine or other stimulants, such as diet pills or energy drinks.  Alcohol.  Stress and anxiety.  Strenuous physical activity.  Fatigue.  Certain medicines.  Heart disease, especially if you have a history of arrhythmias. This includes atrial fibrillation, atrial flutter, or supraventricular tachycardia.  An improperly working pacemaker or defibrillator. DIAGNOSIS  To find the cause of your palpitations, your caregiver will take your history and perform a physical exam. Tests may also be done, including:  Electrocardiography (ECG). This test records the heart's electrical activity.  Cardiac monitoring. This allows your caregiver to monitor your heart rate and rhythm in real time.  Holter monitor. This is a portable device that records your heartbeat and can help diagnose heart arrhythmias. It allows your caregiver to track your heart activity for several days, if needed.  Stress tests by exercise or by giving medicine that makes the heart beat faster. TREATMENT  Treatment of palpitations depends on the cause of your symptoms and can vary greatly. Most cases of palpitations do not require any treatment other than time, relaxation, and monitoring your symptoms. Other causes, such as atrial fibrillation, atrial flutter, or supraventricular tachycardia, usually require further  treatment. HOME CARE INSTRUCTIONS   Avoid:  Caffeinated coffee, tea, soft drinks, diet pills, and energy drinks.  Chocolate.  Alcohol.  Stop smoking if you smoke.  Reduce your stress and anxiety. Things that can help you relax include:  A method that measures bodily functions so you can learn to control them (biofeedback).  Yoga.  Meditation.  Physical activity such as swimming, jogging, or walking.  Get plenty of rest and sleep. SEEK MEDICAL CARE IF:   You continue to have a fast or irregular heartbeat beyond 24 hours.  Your palpitations occur more often. SEEK IMMEDIATE MEDICAL CARE IF:  You develop chest pain or shortness of breath.  You have a severe headache.  You feel dizzy, or you faint. MAKE SURE YOU:  Understand these instructions.  Will watch your condition.  Will get help right away if you are not doing well or get worse. Document Released: 10/31/2000 Document Revised: 02/28/2013 Document Reviewed: 01/02/2012 [facility] Patient Information 2014 [place].

## 2014-04-28 ENCOUNTER — Telehealth: Payer: Self-pay | Admitting: *Deleted

## 2014-04-28 DIAGNOSIS — R002 Palpitations: Secondary | ICD-10-CM

## 2014-04-28 NOTE — Telephone Encounter (Signed)
Patient called to inform our office that he had another episode with his heart being elevated to 160. Nurse advised patient that his monitor has already been ordered and should be on the way. Patient informed nurse that he did speak with [employer] yesterday and they said his monitor would be there in 2 business day. Patient also informed nurse that he drank chocolate milk last night. Nurse advised patient that he had to eliminate any caffeine from his diet. Patient informed that once he get the monitor that he needed to activate it asap so that his rhythm could be monitored constantly. Patient also informed that if his symptoms got worse, to proceed to the ED for an evaluation. Patient verbalized understanding of plan.

## 2014-05-22 ENCOUNTER — Ambulatory Visit: Admitting: Cardiology

## 2014-05-29 ENCOUNTER — Ambulatory Visit (INDEPENDENT_AMBULATORY_CARE_PROVIDER_SITE_OTHER): Admitting: Cardiology

## 2014-05-29 ENCOUNTER — Encounter: Payer: Self-pay | Admitting: Cardiology

## 2014-05-29 VITALS — BP 109/72 | HR 72 | Ht 68.0 in | Wt 189.4 lb

## 2014-05-29 DIAGNOSIS — E785 Hyperlipidemia, unspecified: Secondary | ICD-10-CM

## 2014-05-29 DIAGNOSIS — I471 Supraventricular tachycardia: Secondary | ICD-10-CM | POA: Insufficient documentation

## 2014-05-29 DIAGNOSIS — I2510 Atherosclerotic heart disease of native coronary artery without angina pectoris: Secondary | ICD-10-CM

## 2014-05-29 NOTE — Patient Instructions (Signed)
Your physician recommends that you schedule a follow-up appointment in: 2 months.  Your physician recommends that you continue on your current medications as directed. Please refer to the Current Medication list given to you today.  

## 2014-05-29 NOTE — Assessment & Plan Note (Signed)
No angina symptoms, continue medical therapy and observation for now.

## 2014-05-29 NOTE — Progress Notes (Signed)
Clinical Summary [name] is a 58 y.o.male that I met for the first time in June of this year. Record review finds ER visit subsequently with palpitations, telemetry showing limited episodes of SVT - cannot exclude atrial flutter with 2:1 block. Cardiac markers were normal. He was referred for subsequent outpatient cardiac monitoring.  [employer] monitor showed sinus rhythm throughout with no atrial fibrillation/flutter or PSVT. I reviewed this with him today. He tells me that he has had no further episodes since the initial events. He has cut out caffeine in his diet at the urging of Dr. Nadara Mustard. Also reports compliance with medications.  He continues to work in maintenance, also exercising, working outdoors, and states he feels better by the day. More energy, no angina.  Patient underwent recent cardiac catheterization by Dr. Irish Lack in late April, having presented with an NSTEMI. He had an occluded proximal RCA with left to right and right to right collaterals, underwent attempt at PTCA of the proximal RCA without success, and medical therapy was recommended. LVEF normal at 55%.   No Known Allergies  Current Outpatient Prescriptions  Medication Sig Dispense Refill  . aspirin EC 81 MG EC tablet Take 1 tablet (81 mg total) by mouth daily.      Marland Kitchen atorvastatin (LIPITOR) 80 MG tablet Take 1 tablet (80 mg total) by mouth daily.  30 tablet  11  . clopidogrel (PLAVIX) 75 MG tablet Take 1 tablet (75 mg total) by mouth daily with breakfast.  30 tablet  3  . lisinopril (PRINIVIL,ZESTRIL) 10 MG tablet Take 10 mg by mouth daily.      . metoprolol tartrate (LOPRESSOR) 25 MG tablet Take 1 tablet (25 mg total) by mouth 2 (two) times daily.  60 tablet  11  . nitroGLYCERIN (NITROSTAT) 0.4 MG SL tablet Place 1 tablet (0.4 mg total) under the tongue every 5 (five) minutes x 3 doses as needed for chest pain.  25 tablet  12  . [DISCONTINUED] isosorbide mononitrate (IMDUR) 30 MG 24 hr tablet Take 1 tablet (30 mg  total) by mouth daily.  30 tablet  3  . [DISCONTINUED] lisinopril-hydrochlorothiazide (PRINZIDE,ZESTORETIC) 10-12.5 MG per tablet Take 1 tablet by mouth daily.       No current facility-administered medications for this visit.    Past Medical History  Diagnosis Date  . GERD (gastroesophageal reflux disease)   . Arthritis   . Essential hypertension, benign   . Coronary atherosclerosis of native coronary artery     a. NSTEMI (02/2014):  LHC (02/2014):  Mild disease in LAD and CFX; prox RCA occluded with R-R collats, dist RCA filled by L-R collats, inf HK, EF 55%, LVEDP 15 mmHg.  PCI:  Unsuccessful angioplasty of RCA (late presentation of inf MI) - tx medically.  . Hyperlipidemia   . Renal cell carcinoma of right kidney     Partial nephrectomy in 2013  . NSTEMI (non-ST elevated myocardial infarction) 03/11/14    Social History [name] reports that he quit smoking about 34 years ago. His smoking use included Cigarettes. He smoked 0.00 packs per day. He has never used smokeless tobacco. [name] reports that he does not drink alcohol.  Review of Systems Other systems reviewed and negative.  Physical Examination Filed Vitals:   05/29/14 1349  BP: 109/72  Pulse: 72   Filed Weights   05/29/14 1349  Weight: 189 lb 6.4 oz (85.911 kg)    Patient in no distress.  HEENT: Conjunctiva and lids normal, oropharynx  clear.  Neck: Supple, no elevated JVP or carotid bruits, no thyromegaly.  Lungs: Clear to auscultation, nonlabored breathing at rest.  Cardiac: Regular rate and rhythm, no S3 or significant systolic murmur, no pericardial rub.  Abdomen: Soft, nontender, bowel sounds present, no guarding or rebound.  Extremities: No pitting edema, distal pulses 2+.  Skin: Warm and dry.  Musculoskeletal: No kyphosis.  Neuropsychiatric: Alert and oriented x3, affect grossly appropriate.   Problem List and Plan   PSVT (paroxysmal supraventricular tachycardia) Could have also been possible  atrial flutter with 2:1 block, although no ECG of the event available. Subsequent monitoring shows no further events. Recommend continued exercise, medical therapy, limiting caffeine. If he has further symptoms, we can consider further evaluation.  Coronary atherosclerosis of native coronary artery No angina symptoms, continue medical therapy and observation for now.  HLD (hyperlipidemia) Continues on Lipitor.    Satira Sark, M.D., F.A.C.C.

## 2014-05-29 NOTE — Assessment & Plan Note (Signed)
Could have also been possible atrial flutter with 2:1 block, although no ECG of the event available. Subsequent monitoring shows no further events. Recommend continued exercise, medical therapy, limiting caffeine. If he has further symptoms, we can consider further evaluation.

## 2014-05-29 NOTE — Assessment & Plan Note (Signed)
Continues on Lipitor.

## 2014-06-28 ENCOUNTER — Telehealth: Payer: Self-pay | Admitting: Cardiology

## 2014-06-28 NOTE — Telephone Encounter (Signed)
Left arm is feeling weak and having tingling sensation in his fingers. States I feel fatigue. States he went on vacation last week and played golf. Blood pressure is running low.  Last BP reading was 124/84 pulse 80.

## 2014-06-28 NOTE — Telephone Encounter (Signed)
Spoke with patient and he c/o left arm weakness when using it. Patient said that since he called this morning, he has gone to work and is using his arm fine. Patient denied having chest pain, dizziness or sob. Nurse advised patient to call his PCP for recommendations.

## 2014-07-10 ENCOUNTER — Other Ambulatory Visit (HOSPITAL_COMMUNITY): Payer: Self-pay | Admitting: Interventional Cardiology

## 2014-08-02 ENCOUNTER — Encounter: Payer: Self-pay | Admitting: Cardiology

## 2014-08-02 ENCOUNTER — Ambulatory Visit (INDEPENDENT_AMBULATORY_CARE_PROVIDER_SITE_OTHER): Admitting: Cardiology

## 2014-08-02 VITALS — BP 109/76 | HR 76 | Ht 68.0 in | Wt 192.0 lb

## 2014-08-02 DIAGNOSIS — I2510 Atherosclerotic heart disease of native coronary artery without angina pectoris: Secondary | ICD-10-CM

## 2014-08-02 DIAGNOSIS — I471 Supraventricular tachycardia: Secondary | ICD-10-CM

## 2014-08-02 NOTE — Patient Instructions (Signed)

## 2014-08-02 NOTE — Assessment & Plan Note (Signed)
Symptomatically stable medical therapy. No changes made.

## 2014-08-02 NOTE — Progress Notes (Signed)
Clinical Summary [name] is a 58 y.o.male last seen in July. He has been doing very well, no angina symptoms, no palpitations or unusual shortness of breath. 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 He reports no problems with his medications. Does bruise easily on DAPT.  Patient underwent recent cardiac catheterization by Dr. Irish Lack in late April, having presented with an NSTEMI. He had an occluded proximal RCA with left to right and right to right collaterals, underwent attempt at PTCA of the proximal RCA without success, and medical therapy was recommended. LVEF normal at 55%.  Lipid panel from June showed cholesterol 157, triglycerides 124, HDL 42, and LDL 90.   Allergies  Allergen Reactions  . Alcohol Other (See Comments)    Pts skin gets red. Ex. Pt used alcohol based deodorant and got red things under arms. & Drinks alcohol notices "from heart on up" everything turns red.    Current Outpatient Prescriptions  Medication Sig Dispense Refill  . aspirin EC 81 MG EC tablet Take 1 tablet (81 mg total) by mouth daily.      Marland Kitchen atorvastatin (LIPITOR) 80 MG tablet Take 1 tablet (80 mg total) by mouth daily.  30 tablet  11  . clopidogrel (PLAVIX) 75 MG tablet TAKE 1 TABLET BY MOUTH DAILY WITH BREAKFAST.  30 tablet  3  . lisinopril (PRINIVIL,ZESTRIL) 10 MG tablet Take 10 mg by mouth daily.      . metoprolol tartrate (LOPRESSOR) 25 MG tablet Take 1 tablet (25 mg total) by mouth 2 (two) times daily.  60 tablet  11  . nitroGLYCERIN (NITROSTAT) 0.4 MG SL tablet Place 1 tablet (0.4 mg total) under the tongue every 5 (five) minutes x 3 doses as needed for chest pain.  25 tablet  12  . [DISCONTINUED] isosorbide mononitrate (IMDUR) 30 MG 24 hr tablet Take 1 tablet (30 mg total) by mouth daily.  30 tablet  3  . [DISCONTINUED] lisinopril-hydrochlorothiazide (PRINZIDE,ZESTORETIC) 10-12.5 MG per tablet Take 1 tablet by mouth daily.       No current facility-administered  medications for this visit.    Past Medical History  Diagnosis Date  . GERD (gastroesophageal reflux disease)   . Arthritis   . Essential hypertension, benign   . Coronary atherosclerosis of native coronary artery     a. NSTEMI (02/2014):  LHC (02/2014):  Mild disease in LAD and CFX; prox RCA occluded with R-R collats, dist RCA filled by L-R collats, inf HK, EF 55%, LVEDP 15 mmHg.  PCI:  Unsuccessful angioplasty of RCA (late presentation of inf MI) - tx medically.  . Hyperlipidemia   . Renal cell carcinoma of right kidney     Partial nephrectomy in 2013  . NSTEMI (non-ST elevated myocardial infarction) 03/11/14    Social History [name] reports that he quit smoking about 34 years ago. His smoking use included Cigarettes. He smoked 0.00 packs per day. He has never used smokeless tobacco. [name] reports that he does not drink alcohol.  Review of Systems Other systems reviewed and negative.  Physical Examination Filed Vitals:   08/02/14 1509  BP: 109/76  Pulse: 76   Filed Weights   08/02/14 1509  Weight: 192 lb (87.091 kg)    Patient in no distress.  HEENT: Conjunctiva and lids normal, oropharynx clear.  Neck: Supple, no elevated JVP or carotid bruits, no thyromegaly.  Lungs: Clear to auscultation, nonlabored breathing at rest.  Cardiac: Regular rate and rhythm, no S3 or  significant systolic murmur, no pericardial rub.  Abdomen: Soft, nontender, bowel sounds present, no guarding or rebound.  Extremities: No pitting edema, distal pulses 2+.    Problem List and Plan   Coronary atherosclerosis of native coronary artery Symptomatically stable medical therapy. No changes made.  PSVT (paroxysmal supraventricular tachycardia) No recurrent palpitations. Continue beta blocker and observation.    Satira Sark, M.D., F.A.C.C.

## 2014-08-02 NOTE — Assessment & Plan Note (Signed)
No recurrent palpitations. Continue beta blocker and observation.

## 2014-09-22 ENCOUNTER — Ambulatory Visit (HOSPITAL_COMMUNITY)
Admission: RE | Admit: 2014-09-22 | Discharge: 2014-09-22 | Disposition: A | Discharge status: Home / Self Care | Source: Ambulatory Visit | Attending: Urology | Admitting: Urology

## 2014-09-22 ENCOUNTER — Other Ambulatory Visit (HOSPITAL_COMMUNITY): Payer: Self-pay | Admitting: Urology

## 2014-09-22 DIAGNOSIS — Z85528 Personal history of other malignant neoplasm of kidney: Secondary | ICD-10-CM | POA: Diagnosis not present

## 2014-10-26 ENCOUNTER — Encounter (HOSPITAL_COMMUNITY): Payer: Self-pay | Admitting: Interventional Cardiology

## 2014-11-03 ENCOUNTER — Other Ambulatory Visit: Payer: Self-pay | Admitting: Cardiology

## 2015-02-01 ENCOUNTER — Encounter: Payer: Self-pay | Admitting: Cardiology

## 2015-02-01 ENCOUNTER — Ambulatory Visit (INDEPENDENT_AMBULATORY_CARE_PROVIDER_SITE_OTHER): Admitting: Cardiology

## 2015-02-01 VITALS — BP 102/84 | HR 68 | Ht 68.0 in | Wt 201.0 lb

## 2015-02-01 DIAGNOSIS — I2510 Atherosclerotic heart disease of native coronary artery without angina pectoris: Secondary | ICD-10-CM

## 2015-02-01 DIAGNOSIS — E785 Hyperlipidemia, unspecified: Secondary | ICD-10-CM

## 2015-02-01 DIAGNOSIS — I10 Essential (primary) hypertension: Secondary | ICD-10-CM

## 2015-02-01 NOTE — Patient Instructions (Signed)
   Lab for lipid panel - Reminder:  Nothing to eat or drink after 12 midnight prior to labs. Office will contact with results via phone or letter.   Continue all current medications. Your physician wants you to follow up in: 6 months.  You will receive a reminder letter in the mail one-two months in advance.  If you don't receive a letter, please call our office to schedule the follow up appointment

## 2015-02-01 NOTE — Progress Notes (Signed)
Cardiology Office Note  Date: 02/01/2015   ID: Curtis, [name] 1956-09-04, MRN [id number]  PCP: Rory Percy, MD  Primary Cardiologist: Rozann Lesches, MD   Chief Complaint  Patient presents with  . Coronary Artery Disease  . Hypertension  . Hyperlipidemia    History of Present Illness: [name] is a 59 y.o. male last seen in September 2015. He presents for a routine visit. Still works full time at [employer]. He has done some walking for exercise, but gained weight over the winter. We talked about getting back to a more regular fitness plan. He does not endorse any angina symptoms. He reports compliance with his medications.  He continues on high-dose Lipitor, due for follow-up lipid panel. He reports no obvious side effects.  We discussed his cardiac catheterization findings from last year and plan to continue medical therapy.  Past Medical History  Diagnosis Date  . GERD (gastroesophageal reflux disease)   . Arthritis   . Essential hypertension, benign   . Coronary atherosclerosis of native coronary artery     a. NSTEMI (02/2014):  LHC (02/2014):  Mild disease in LAD and CFX; prox RCA occluded with R-R collats, dist RCA filled by L-R collats, inf HK, EF 55%, LVEDP 15 mmHg.  PCI:  Unsuccessful angioplasty of RCA (late presentation of inf MI) - tx medically.  . Hyperlipidemia   . Renal cell carcinoma of right kidney     Partial nephrectomy in 2013  . NSTEMI (non-ST elevated myocardial infarction) 03/11/14    Past Surgical History  Procedure Laterality Date  . Back surgery      1989  . Tonsillectomy    . Robot assisted laparoscopic nephrectomy  01/14/2012    Procedure: ROBOTIC ASSISTED LAPAROSCOPIC NEPHRECTOMY;  Surgeon: Molli Hazard, MD;  Location: [facility];  Service: Urology;  Laterality: Right;  Robot Laparoscopic Right Partial Nephrectomy    . Left heart catheterization with coronary angiogram N/A 03/17/2014    Procedure: LEFT HEART CATHETERIZATION  WITH CORONARY ANGIOGRAM;  Surgeon: Jettie Booze, MD;  Location: [facility] CATH LAB;  Service: Cardiovascular;  Laterality: N/A;    Current Outpatient Prescriptions  Medication Sig Dispense Refill  . aspirin EC 81 MG EC tablet Take 1 tablet (81 mg total) by mouth daily.    Marland Kitchen atorvastatin (LIPITOR) 80 MG tablet Take 1 tablet (80 mg total) by mouth daily. 30 tablet 11  . clopidogrel (PLAVIX) 75 MG tablet TAKE 1 TABLET BY MOUTH DAILY WITH BREAKFAST. 30 tablet 3  . lisinopril (PRINIVIL,ZESTRIL) 10 MG tablet Take 10 mg by mouth daily.    . metoprolol tartrate (LOPRESSOR) 25 MG tablet Take 1 tablet (25 mg total) by mouth 2 (two) times daily. 60 tablet 11  . nitroGLYCERIN (NITROSTAT) 0.4 MG SL tablet Place 1 tablet (0.4 mg total) under the tongue every 5 (five) minutes x 3 doses as needed for chest pain. 25 tablet 12  . [DISCONTINUED] isosorbide mononitrate (IMDUR) 30 MG 24 hr tablet Take 1 tablet (30 mg total) by mouth daily. 30 tablet 3  . [DISCONTINUED] lisinopril-hydrochlorothiazide (PRINZIDE,ZESTORETIC) 10-12.5 MG per tablet Take 1 tablet by mouth daily.     No current facility-administered medications for this visit.    Allergies:  Alcohol   Social History: The patient  reports that he quit smoking about 35 years ago. His smoking use included Cigarettes. He has never used smokeless tobacco. He reports that he does not drink alcohol or use illicit drugs.   ROS:  Please  see the history of present illness. Otherwise, complete review of systems is positive for none.  All other systems are reviewed and negative.    Physical Exam: VS:  BP 102/84 mmHg  Pulse 68  Ht 5\' 8"  (1.727 m)  Wt 201 lb (91.173 kg)  BMI 30.57 kg/m2  SpO2 97%, BMI Body mass index is 30.57 kg/(m^2).  Wt Readings from Last 3 Encounters:  02/01/15 201 lb (91.173 kg)  08/02/14 192 lb (87.091 kg)  05/29/14 189 lb 6.4 oz (85.911 kg)     Patient in no distress.  HEENT: Conjunctiva and lids normal, oropharynx clear.   Neck: Supple, no elevated JVP or carotid bruits, no thyromegaly.  Lungs: Clear to auscultation, nonlabored breathing at rest.  Cardiac: Regular rate and rhythm, no S3 or significant systolic murmur, no pericardial rub.  Abdomen: Soft, nontender, bowel sounds present, no guarding or rebound.  Extremities: No pitting edema, distal pulses 2+.    ECG: ECG is not ordered today.   Recent Labwork: 04/24/2014: ALT 49; AST 29; BUN 22; Creatinine 0.94; Hemoglobin 14.0; Platelets 225; Potassium 3.9; Sodium 143     Component Value Date/Time   CHOL 157 04/19/2014 1011   TRIG 124 04/19/2014 1011   HDL 42 04/19/2014 1011   CHOLHDL 3.7 04/19/2014 1011   VLDL 25 04/19/2014 1011   LDLCALC 90 04/19/2014 1011    Assessment and Plan:  1. CAD status post NSTEMI in April 2015 with occlusion of the RCA associated with good collateral system. Otherwise mild nonobstructive disease in the left system. Plan is to continue medical therapy, he will stay on DAPT for now and at least complete the year from his event.   2. Hyperlipidemia, on high-dose Lipitor. Follow-up lipid panel.  3. Essential hypertension, blood pressure normal today.  Current medicines are reviewed at length with the patient today.  The patient does not have concerns regarding medicines.   Orders Placed This Encounter  Procedures  . Lipid panel    Disposition: FU with me in 6 months.   Signed, Satira Sark, MD, [facility] 02/01/2015 10:40 AM    [facility] [facility] [place], [place], [place] [address] Phone: [phone]; Fax: [phone]

## 2015-02-05 ENCOUNTER — Other Ambulatory Visit: Payer: Self-pay | Admitting: Cardiology

## 2015-02-05 LAB — LIPID PANEL
CHOL/HDL RATIO: 3.5 ratio
CHOLESTEROL: 119 mg/dL (ref 0–200)
HDL: 34 mg/dL — ABNORMAL LOW (ref >=40)
LDL Cholesterol: 64 mg/dL (ref 0–99)
Triglycerides: 103 mg/dL (ref <150)
VLDL: 21 mg/dL (ref 0–40)

## 2015-02-07 ENCOUNTER — Telehealth: Payer: Self-pay | Admitting: *Deleted

## 2015-02-07 NOTE — Telephone Encounter (Signed)
-----   Message from Satira Sark, MD sent at 02/06/2015  7:54 AM EDT ----- Reviewed. Lipids look good overall with LDL down to 64 from 90. HDL is low at 34 (previously 42) - this may improve by increasing regular activity.

## 2015-02-07 NOTE — Telephone Encounter (Signed)
Pt made aware, forwarded to Dr. Nadara Mustard

## 2015-03-05 ENCOUNTER — Other Ambulatory Visit: Payer: Self-pay | Admitting: Interventional Cardiology

## 2015-03-05 ENCOUNTER — Other Ambulatory Visit: Payer: Self-pay | Admitting: Cardiology

## 2015-03-08 ENCOUNTER — Other Ambulatory Visit: Payer: Self-pay | Admitting: Cardiology

## 2015-04-04 ENCOUNTER — Other Ambulatory Visit: Payer: Self-pay | Admitting: Physician Assistant

## 2015-07-03 ENCOUNTER — Other Ambulatory Visit: Payer: Self-pay | Admitting: Cardiology

## 2015-07-19 ENCOUNTER — Encounter: Payer: Self-pay | Admitting: Cardiology

## 2015-07-19 ENCOUNTER — Ambulatory Visit (INDEPENDENT_AMBULATORY_CARE_PROVIDER_SITE_OTHER): Admitting: Cardiology

## 2015-07-19 VITALS — BP 132/87 | HR 57 | Ht 68.0 in | Wt 193.8 lb

## 2015-07-19 DIAGNOSIS — I2510 Atherosclerotic heart disease of native coronary artery without angina pectoris: Secondary | ICD-10-CM | POA: Diagnosis not present

## 2015-07-19 DIAGNOSIS — R6881 Early satiety: Secondary | ICD-10-CM

## 2015-07-19 DIAGNOSIS — I10 Essential (primary) hypertension: Secondary | ICD-10-CM

## 2015-07-19 DIAGNOSIS — E785 Hyperlipidemia, unspecified: Secondary | ICD-10-CM

## 2015-07-19 MED ORDER — LOSARTAN POTASSIUM 25 MG PO TABS: 25.0000 mg | ORAL_TABLET | Freq: Every day | ORAL | Status: DC

## 2015-07-19 MED ORDER — METOPROLOL TARTRATE 25 MG PO TABS: 12.5000 mg | ORAL_TABLET | Freq: Two times a day (BID) | ORAL | Status: DC

## 2015-07-19 NOTE — Patient Instructions (Signed)
Your physician has recommended you make the following change in your medication:  Stop plavix. Decrease metoprolol tartrate to 12.5 mg twice daily. Please break your 25 mg tablets in half twice daily. Stop lisinopril. Start losartan 25 mg daily. Continue all other medications the same. Please contact Dr. Lyman Speller office regarding your symptoms. Your physician recommends that you schedule a follow-up appointment in: 6 months. You will receive a reminder letter in the mail in about 4 months reminding you to call and schedule your appointment. If you don't receive this letter, please contact our office.

## 2015-07-19 NOTE — Progress Notes (Signed)
Cardiology Office Note  Date: 07/19/2015   ID: [name], [name] December 30, 1955, MRN [id number]  PCP: Rory Percy, MD  Primary Cardiologist: Rozann Lesches, MD   Chief Complaint  Patient presents with  . Coronary Artery Disease  . Hypertension  . Hyperelipidemia    History of Present Illness: [name] is a 59 y.o. male last seen in March. He comes in for a follow-up visit today. No angina symptoms reported. He does state that he has had fatigue in general, also a dry cough. We went over his medications. In addition he has had trouble with what sounds like early satiety and about a 10 pound weight loss over a period of months. He denies any melena or hematochezia. No dysphagia. He has not seen Dr. Nadara Mustard about the symptoms as yet.  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  He has had no interval hospitalizations.  Past Medical History  Diagnosis Date  . GERD (gastroesophageal reflux disease)   . Arthritis   . Essential hypertension, benign   . Coronary atherosclerosis of native coronary artery     a. NSTEMI (02/2014):  LHC (02/2014):  Mild disease in LAD and CFX; prox RCA occluded with R-R collats, dist RCA filled by L-R collats, inf HK, EF 55%, LVEDP 15 mmHg.  PCI:  Unsuccessful angioplasty of RCA (late presentation of inf MI) - tx medically.  . Hyperlipidemia   . Renal cell carcinoma of right kidney     Partial nephrectomy in 2013  . NSTEMI (non-ST elevated myocardial infarction) 03/11/14    Past Surgical History  Procedure Laterality Date  . Back surgery      1989  . Tonsillectomy    . Robot assisted laparoscopic nephrectomy  01/14/2012    Procedure: ROBOTIC ASSISTED LAPAROSCOPIC NEPHRECTOMY;  Surgeon: Molli Hazard, MD;  Location: [facility];  Service: Urology;  Laterality: Right;  Robot Laparoscopic Right Partial Nephrectomy    . Left heart catheterization with coronary angiogram N/A 03/17/2014    Procedure: LEFT HEART CATHETERIZATION  WITH CORONARY ANGIOGRAM;  Surgeon: Jettie Booze, MD;  Location: [facility] CATH LAB;  Service: Cardiovascular;  Laterality: N/A;    Current Outpatient Prescriptions  Medication Sig Dispense Refill  . aspirin EC 81 MG EC tablet Take 1 tablet (81 mg total) by mouth daily.    Marland Kitchen atorvastatin (LIPITOR) 80 MG tablet TAKE 1 TABLET BY MOUTH DAILY 30 tablet 6  . metoprolol tartrate (LOPRESSOR) 25 MG tablet Take 0.5 tablets (12.5 mg total) by mouth 2 (two) times daily. 90 tablet 3  . nitroGLYCERIN (NITROSTAT) 0.4 MG SL tablet Place 1 tablet (0.4 mg total) under the tongue every 5 (five) minutes x 3 doses as needed for chest pain. 25 tablet 12  . losartan (COZAAR) 25 MG tablet Take 1 tablet (25 mg total) by mouth daily. 90 tablet 3  . [DISCONTINUED] isosorbide mononitrate (IMDUR) 30 MG 24 hr tablet Take 1 tablet (30 mg total) by mouth daily. 30 tablet 3  . [DISCONTINUED] lisinopril-hydrochlorothiazide (PRINZIDE,ZESTORETIC) 10-12.5 MG per tablet Take 1 tablet by mouth daily.     No current facility-administered medications for this visit.    Allergies:  Alcohol   Social History: The patient  reports that he quit smoking about 35 years ago. His smoking use included Cigarettes. He has never used smokeless tobacco. He reports that he does not drink alcohol or use illicit drugs.   ROS:  Please see the history of present illness.  Otherwise, complete review of systems is positive for none.  All other systems are reviewed and negative.   Physical Exam: VS:  BP 132/87 mmHg  Pulse 57  Ht 5\' 8"  (1.727 m)  Wt 193 lb 12.8 oz (87.907 kg)  BMI 29.47 kg/m2  SpO2 97%, BMI Body mass index is 29.47 kg/(m^2).  Wt Readings from Last 3 Encounters:  07/19/15 193 lb 12.8 oz (87.907 kg)  02/01/15 201 lb (91.173 kg)  08/02/14 192 lb (87.091 kg)     Patient in no distress.  HEENT: Conjunctiva and lids normal, oropharynx clear.  Neck: Supple, no elevated JVP or carotid bruits, no thyromegaly.  Lungs: Clear to  auscultation, nonlabored breathing at rest.  Cardiac: Regular rate and rhythm, no S3 or significant systolic murmur, no pericardial rub.  Abdomen: Soft, nontender, bowel sounds present, no guarding or rebound.  Extremities: No pitting edema, distal pulses 2+.    ECG: ECG is not ordered today.   Recent Labwork:    Component Value Date/Time   CHOL 119 02/05/2015 0940   TRIG 103 02/05/2015 0940   HDL 34* 02/05/2015 0940   CHOLHDL 3.5 02/05/2015 0940   VLDL 21 02/05/2015 0940   LDLCALC 64 02/05/2015 0940    Assessment and Plan:  1. Symptomatically stable CAD with known occluded proximal RCA associated with good collaterals. He will stop Plavix and continue aspirin at this point. Also plan to cut Lopressor dose in half to see if this might improve his sense of fatigue.  2. Essential hypertension, change lisinopril to Cozaar with reports of dry cough.  3. Hyperlipidemia, well controlled on Lipitor, recent LDL 64.  4. Early satiety and weight loss. I asked him to see Dr. Nadara Mustard. He is not reporting any melena or hematochezia, no abdominal pain, no dysphagia. He does have a previous history of renal cell carcinoma status post nephrectomy.  Current medicines were reviewed with the patient today.  Disposition: FU with me in 6 months.   Signed, Satira Sark, MD, [facility] 07/19/2015 10:49 AM    [facility] at [place], [place], [place] [address] Phone: [phone]; Fax: [phone]

## 2015-09-28 ENCOUNTER — Other Ambulatory Visit: Payer: Self-pay | Admitting: Urology

## 2015-09-28 ENCOUNTER — Ambulatory Visit (HOSPITAL_COMMUNITY)
Admission: RE | Admit: 2015-09-28 | Discharge: 2015-09-28 | Disposition: A | Discharge status: Home / Self Care | Source: Ambulatory Visit | Attending: Urology | Admitting: Urology

## 2015-09-28 DIAGNOSIS — J9811 Atelectasis: Secondary | ICD-10-CM | POA: Insufficient documentation

## 2015-09-28 DIAGNOSIS — C641 Malignant neoplasm of right kidney, except renal pelvis: Secondary | ICD-10-CM | POA: Insufficient documentation

## 2015-10-31 ENCOUNTER — Other Ambulatory Visit: Payer: Self-pay | Admitting: Cardiology

## 2016-04-01 ENCOUNTER — Ambulatory Visit (INDEPENDENT_AMBULATORY_CARE_PROVIDER_SITE_OTHER): Admitting: Cardiology

## 2016-04-01 ENCOUNTER — Encounter: Payer: Self-pay | Admitting: Cardiology

## 2016-04-01 ENCOUNTER — Other Ambulatory Visit: Payer: Self-pay | Admitting: *Deleted

## 2016-04-01 VITALS — BP 130/86 | HR 58 | Ht 68.0 in | Wt 197.0 lb

## 2016-04-01 DIAGNOSIS — E785 Hyperlipidemia, unspecified: Secondary | ICD-10-CM

## 2016-04-01 DIAGNOSIS — I10 Essential (primary) hypertension: Secondary | ICD-10-CM

## 2016-04-01 DIAGNOSIS — I2510 Atherosclerotic heart disease of native coronary artery without angina pectoris: Secondary | ICD-10-CM | POA: Diagnosis not present

## 2016-04-01 DIAGNOSIS — Z5181 Encounter for therapeutic drug level monitoring: Secondary | ICD-10-CM | POA: Diagnosis not present

## 2016-04-01 MED ORDER — METOPROLOL TARTRATE 25 MG PO TABS
25.0000 mg | ORAL_TABLET | Freq: Two times a day (BID) | ORAL | Status: DC
Start: 2016-04-01 — End: 2017-03-27

## 2016-04-01 NOTE — Progress Notes (Signed)
Cardiology Office Note  Date: 04/01/2016   ID: [facility], Kritzer 21-Nov-1955, MRN [id number]  PCP: Rory Percy, MD  Primary Cardiologist: Rozann Lesches, MD   Chief Complaint  Patient presents with  . Coronary Artery Disease    History of Present Illness: [name] is a 60 y.o. male last seen in September 2016. He presents for a follow-up visit. 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 Does not report any angina symptoms or nitroglycerin use. He is interested in trying to get back to playing some golf and also increasing his walking for exercise.  I reviewed his medications. He did increase Lopressor back to 25 mg twice daily due to his blood pressure trending up. He is tolerating the medication in terms of bradycardia.  He has not had a recent lipid panel, has not seen Dr. Nadara Mustard. LDL was 64 last year.  His weight is up compared to September. States that he is trying to pay more attention to his diet however.  Past Medical History  Diagnosis Date  . GERD (gastroesophageal reflux disease)   . Arthritis   . Essential hypertension, benign   . Coronary atherosclerosis of native coronary artery     a. NSTEMI (02/2014):  LHC (02/2014):  Mild disease in LAD and CFX; prox RCA occluded with R-R collats, dist RCA filled by L-R collats, inf HK, EF 55%, LVEDP 15 mmHg.  PCI:  Unsuccessful angioplasty of RCA (late presentation of inf MI) - tx medically.  . Hyperlipidemia   . Renal cell carcinoma of right kidney (HCC)     Partial nephrectomy in 2013  . NSTEMI (non-ST elevated myocardial infarction) ([place]) 03/11/14    Past Surgical History  Procedure Laterality Date  . Back surgery      1989  . Tonsillectomy    . Robot assisted laparoscopic nephrectomy  01/14/2012    Procedure: ROBOTIC ASSISTED LAPAROSCOPIC NEPHRECTOMY;  Surgeon: Molli Hazard, MD;  Location: [facility];  Service: Urology;  Laterality: Right;  Robot Laparoscopic Right Partial Nephrectomy    .  Left heart catheterization with coronary angiogram N/A 03/17/2014    Procedure: LEFT HEART CATHETERIZATION WITH CORONARY ANGIOGRAM;  Surgeon: Jettie Booze, MD;  Location: [facility] CATH LAB;  Service: Cardiovascular;  Laterality: N/A;    Current Outpatient Prescriptions  Medication Sig Dispense Refill  . aspirin EC 81 MG EC tablet Take 1 tablet (81 mg total) by mouth daily.    Marland Kitchen atorvastatin (LIPITOR) 80 MG tablet TAKE 1 TABLET BY MOUTH DAILY 30 tablet 6  . losartan (COZAAR) 25 MG tablet Take 1 tablet (25 mg total) by mouth daily. 90 tablet 3  . metoprolol tartrate (LOPRESSOR) 25 MG tablet Take 1 tablet (25 mg total) by mouth 2 (two) times daily. 180 tablet 3  . nitroGLYCERIN (NITROSTAT) 0.4 MG SL tablet Place 1 tablet (0.4 mg total) under the tongue every 5 (five) minutes x 3 doses as needed for chest pain. 25 tablet 12  . [DISCONTINUED] isosorbide mononitrate (IMDUR) 30 MG 24 hr tablet Take 1 tablet (30 mg total) by mouth daily. 30 tablet 3  . [DISCONTINUED] lisinopril-hydrochlorothiazide (PRINZIDE,ZESTORETIC) 10-12.5 MG per tablet Take 1 tablet by mouth daily.     No current facility-administered medications for this visit.   Allergies:  Alcohol   Social History: The patient  reports that he quit smoking about 36 years ago. His smoking use included Cigarettes. He has never used smokeless tobacco. He reports that he does not  drink alcohol or use illicit drugs.   ROS:  Please see the history of present illness. Otherwise, complete review of systems is positive for none.  All other systems are reviewed and negative.   Physical Exam: VS:  BP 130/86 mmHg  Pulse 58  Ht 5\' 8"  (1.727 m)  Wt 197 lb (89.359 kg)  BMI 29.96 kg/m2  SpO2 98%, BMI Body mass index is 29.96 kg/(m^2).  Wt Readings from Last 3 Encounters:  04/01/16 197 lb (89.359 kg)  07/19/15 193 lb 12.8 oz (87.907 kg)  02/01/15 201 lb (91.173 kg)    Patient in no distress.  HEENT: Conjunctiva and lids normal, oropharynx clear.   Neck: Supple, no elevated JVP or carotid bruits, no thyromegaly.  Lungs: Clear to auscultation, nonlabored breathing at rest.  Cardiac: Regular rate and rhythm, no S3 or significant systolic murmur, no pericardial rub.  Abdomen: Soft, nontender, bowel sounds present, no guarding or rebound.  Extremities: No pitting edema, distal pulses 2+. Skin: Warm and dry. Musculoskeletal: No kyphosis. Neuropsychiatric: Alert and oriented 3, affect appropriate.   ECG: I personally reviewed the prior tracing from 04/24/2014 which showed sinus rhythm with left atrial enlargement and nonspecific T-wave changes.  Recent Labwork:    Component Value Date/Time   CHOL 119 02/05/2015 0940   TRIG 103 02/05/2015 0940   HDL 34* 02/05/2015 0940   CHOLHDL 3.5 02/05/2015 0940   VLDL 21 02/05/2015 0940   LDLCALC 64 02/05/2015 0940   Other Studies Reviewed Today:  Cardiac catheterization with attempted PTCA 03/17/2014: IMPRESSIONS:  1. Normal left main coronary artery. 2. Mild disease in the left anterior descending artery and its branches. 3. Mild disease in the left circumflex artery and its branches. 4. Occluded right coronary artery. PTCA of the proximal right coronary artery. This did not improve visualization of the RCA. Distal RCA fills by left to right, and right to right collaterals. 5. Overall normal left ventricular systolic function. LVEDP 15 mmHg. Ejection fraction 55 %. 6. Mildly dilated aortic root.  Assessment and Plan:  1. CAD with occluded proximal RCA is associated with good collaterals. He is symptomatically stable on present regimen, will refill Lopressor at 25 mg twice daily since he seems to be tolerating this and his blood pressure is reasonable. Recommended increasing exercise regimen in gradual fashion as tolerated.  2. Essential hypertension, continue present medications.  3. Hyperlipidemia, recheck FLP and LFTs. He continues on Lipitor.  Current medicines were  reviewed with the patient today.   Orders Placed This Encounter  Procedures  . Hepatic function panel  . Lipid panel    Disposition: FU with me in 6 months.   Signed, Satira Sark, MD, [facility] 04/01/2016 4:44 PM    [place] at [place], [place], [place] [address] Phone: [phone]; Fax: [phone]

## 2016-04-01 NOTE — Patient Instructions (Signed)
Your physician recommends that you continue on your current medications as directed. Please refer to the Current Medication list given to you today. Your physician recommends that you return for a FASTING lipid/liver profile.  Your physician recommends that you schedule a follow-up appointment in: 6 months. You will receive a reminder letter in the mail in about 4 months reminding you to call and schedule your appointment. If you don't receive this letter, please contact our office.

## 2016-04-16 ENCOUNTER — Other Ambulatory Visit: Payer: Self-pay | Admitting: Cardiology

## 2016-04-16 LAB — HEPATIC FUNCTION PANEL
ALBUMIN: 4.3 g/dL (ref 3.6–5.1)
ALT: 21 U/L (ref 9–46)
AST: 19 U/L (ref 10–35)
Alkaline Phosphatase: 75 U/L (ref 40–115)
BILIRUBIN INDIRECT: 0.9 mg/dL (ref 0.2–1.2)
Bilirubin, Direct: 0.2 mg/dL (ref <=0.2)
TOTAL PROTEIN: 6.6 g/dL (ref 6.1–8.1)
Total Bilirubin: 1.1 mg/dL (ref 0.2–1.2)

## 2016-04-16 LAB — LIPID PANEL
CHOL/HDL RATIO: 2.8 ratio (ref <=5.0)
CHOLESTEROL: 121 mg/dL — AB (ref 125–200)
HDL: 43 mg/dL (ref >=40)
LDL Cholesterol: 62 mg/dL (ref <130)
TRIGLYCERIDES: 81 mg/dL (ref <150)
VLDL: 16 mg/dL (ref <30)

## 2016-04-18 ENCOUNTER — Telehealth: Payer: Self-pay | Admitting: *Deleted

## 2016-04-18 NOTE — Telephone Encounter (Signed)
-----   Message from Satira Sark, MD sent at 04/17/2016  7:39 AM EDT ----- Reviewed. LDL looks good at 62 and LFTs are normal. No change to current medication.

## 2016-04-24 NOTE — Telephone Encounter (Signed)
Patient informed. 

## 2016-05-28 ENCOUNTER — Other Ambulatory Visit: Payer: Self-pay | Admitting: Cardiology

## 2016-07-28 ENCOUNTER — Other Ambulatory Visit: Payer: Self-pay | Admitting: Cardiology

## 2016-09-26 NOTE — Progress Notes (Signed)
Cardiology Office Note  Date: 10/01/2016   ID: Darik, Amenta 07-04-1956, MRN [id number]  PCP: Rory Percy, MD  Primary Cardiologist: Rozann Lesches, MD   Chief Complaint  Patient presents with  . Coronary Artery Disease    History of Present Illness: [name] is a 60 y.o. male last seen in May. He presents for a routine follow-up visit. Reports no angina symptoms and stable NYHA class II dyspnea. He is no longer working at [employer], now working part time as a Training and development officer at [employer]. Has been trying to do walking for exercise. Also helping to take care of his grandchildren.  I reviewed his ECG today which shows sinus bradycardia with nonspecific T-wave changes.  He had a lipid panel done earlier this year showing LDL 62. Current medications include aspirin, Cozaar, Lipitor, Lopressor, and as needed nitroglycerin.  Past Medical History:  Diagnosis Date  . Arthritis   . Coronary atherosclerosis of native coronary artery    a. NSTEMI (02/2014):  LHC (02/2014):  Mild disease in LAD and CFX; prox RCA occluded with R-R collats, dist RCA filled by L-R collats, inf HK, EF 55%, LVEDP 15 mmHg.  PCI:  Unsuccessful angioplasty of RCA (late presentation of inf MI) - tx medically.  . Essential hypertension, benign   . GERD (gastroesophageal reflux disease)   . Hyperlipidemia   . NSTEMI (non-ST elevated myocardial infarction) ([place]) 03/11/14  . Renal cell carcinoma of right kidney (HCC)    Partial nephrectomy in 2013    Past Surgical History:  Procedure Laterality Date  . BACK SURGERY     1989  . LEFT HEART CATHETERIZATION WITH CORONARY ANGIOGRAM N/A 03/17/2014   Procedure: LEFT HEART CATHETERIZATION WITH CORONARY ANGIOGRAM;  Surgeon: Jettie Booze, MD;  Location: [facility] CATH LAB;  Service: Cardiovascular;  Laterality: N/A;  . ROBOT ASSISTED LAPAROSCOPIC NEPHRECTOMY  01/14/2012   Procedure: ROBOTIC ASSISTED LAPAROSCOPIC NEPHRECTOMY;  Surgeon: Molli Hazard, MD;   Location: [facility];  Service: Urology;  Laterality: Right;  Robot Laparoscopic Right Partial Nephrectomy    . TONSILLECTOMY      Current Outpatient Prescriptions  Medication Sig Dispense Refill  . aspirin EC 81 MG EC tablet Take 1 tablet (81 mg total) by mouth daily.    Marland Kitchen atorvastatin (LIPITOR) 80 MG tablet TAKE 1 TABLET BY MOUTH DAILY 30 tablet 6  . losartan (COZAAR) 25 MG tablet TAKE 1 TABLET BY MOUTH EVERY DAY (STOP LISINOPRIL) 90 tablet 3  . metoprolol tartrate (LOPRESSOR) 25 MG tablet Take 1 tablet (25 mg total) by mouth 2 (two) times daily. 180 tablet 3  . nitroGLYCERIN (NITROSTAT) 0.4 MG SL tablet Place 1 tablet (0.4 mg total) under the tongue every 5 (five) minutes x 3 doses as needed for chest pain. 25 tablet 12   No current facility-administered medications for this visit.    Allergies:  Alcohol   Social History: The patient  reports that he quit smoking about 36 years ago. His smoking use included Cigarettes. He has never used smokeless tobacco. He reports that he does not drink alcohol or use drugs.   ROS:  Please see the history of present illness. Otherwise, complete review of systems is positive for none.  All other systems are reviewed and negative.   Physical Exam: VS:  BP 107/71   Pulse 62   Ht 5\' 8"  (1.727 m)   Wt 190 lb 6.4 oz (86.4 kg)   SpO2 98%   BMI 28.95 kg/m , BMI  Body mass index is 28.95 kg/m.  Wt Readings from Last 3 Encounters:  10/01/16 190 lb 6.4 oz (86.4 kg)  04/01/16 197 lb (89.4 kg)  07/19/15 193 lb 12.8 oz (87.9 kg)    Patient in no distress.  HEENT: Conjunctiva and lids normal, oropharynx clear.  Neck: Supple, no elevated JVP or carotid bruits, no thyromegaly.  Lungs: Clear to auscultation, nonlabored breathing at rest.  Cardiac: Regular rate and rhythm, no S3 or significant systolic murmur, no pericardial rub.  Abdomen: Soft, nontender, bowel sounds present, no guarding or rebound.  Extremities: No pitting edema, distal pulses  2+. Skin: Warm and dry. Musculoskeletal: No kyphosis. Neuropsychiatric: Alert and oriented 3, affect appropriate.   ECG: I personally reviewed the tracing from 04/24/2014 which showed sinus rhythm with left atrial enlargement and nonspecific T-wave changes.  Recent Labwork: 04/16/2016: ALT 21; AST 19     Component Value Date/Time   CHOL 121 (L) 04/16/2016 1128   TRIG 81 04/16/2016 1128   HDL 43 04/16/2016 1128   CHOLHDL 2.8 04/16/2016 1128   VLDL 16 04/16/2016 1128   LDLCALC 62 04/16/2016 1128    Other Studies Reviewed Today:  Cardiac catheterization with attempted PTCA 03/17/2014: IMPRESSIONS:  1. Normal left main coronary artery. 2. Mild disease in the left anterior descending artery and its branches. 3. Mild disease in the left circumflex artery and its branches. 4. Occluded right coronary artery. PTCA of the proximal right coronary artery. This did not improve visualization of the RCA. Distal RCA fills by left to right, and right to right collaterals. 5. Overall normal left ventricular systolic function. LVEDP 15 mmHg. Ejection fraction 55 %.           6. Mildly dilated aortic root.  Assessment and Plan:  1. Symptomatically stable CAD with previously documented occlusion of the RCA in 2015 associated with right to right collaterals and left-to-right collaterals. We continue medical therapy without active angina symptoms. ECG reviewed and stable.  2. Essential hypertension, blood pressure is well controlled today.  3. Hyperlipidemia, continues on statin therapy with LDL 62 earlier in the year. LFTs normal.  4. History of reflux, no progressive symptoms. He has been able to stay on aspirin.  Current medicines were reviewed with the patient today.   Orders Placed This Encounter  Procedures  . EKG 12-Lead    Disposition: Follow-up in 6 months with repeat lipid panel.  Signed, Satira Sark, MD, [facility] 10/01/2016 4:22 PM    [place] at [place], [place], [place] [address] Phone: [phone]; Fax: [phone]

## 2016-10-01 ENCOUNTER — Ambulatory Visit (INDEPENDENT_AMBULATORY_CARE_PROVIDER_SITE_OTHER): Admitting: Cardiology

## 2016-10-01 ENCOUNTER — Encounter: Payer: Self-pay | Admitting: Cardiology

## 2016-10-01 VITALS — BP 107/71 | HR 62 | Ht 68.0 in | Wt 190.4 lb

## 2016-10-01 DIAGNOSIS — K219 Gastro-esophageal reflux disease without esophagitis: Secondary | ICD-10-CM

## 2016-10-01 DIAGNOSIS — I10 Essential (primary) hypertension: Secondary | ICD-10-CM

## 2016-10-01 DIAGNOSIS — I2510 Atherosclerotic heart disease of native coronary artery without angina pectoris: Secondary | ICD-10-CM | POA: Diagnosis not present

## 2016-10-01 DIAGNOSIS — E782 Mixed hyperlipidemia: Secondary | ICD-10-CM

## 2016-10-01 NOTE — Patient Instructions (Addendum)
Your physician wants you to follow-up in: [place] DR. Domenic Polite You will receive a reminder letter in the mail two months in advance. If you don't receive a letter, please call our office to schedule the follow-up appointment.  Your physician recommends that you continue on your current medications as directed. Please refer to the Current Medication list given to you today.  Your physician recommends that you return for lab work in: [place] NEXT VISIT PLEASE FAST PRIOR TO LAB WORK   Thank you for choosing [facility]!!

## 2016-12-26 ENCOUNTER — Other Ambulatory Visit: Payer: Self-pay | Admitting: Cardiology

## 2017-03-27 ENCOUNTER — Other Ambulatory Visit: Payer: Self-pay | Admitting: Cardiology

## 2017-04-02 ENCOUNTER — Encounter: Payer: Self-pay | Admitting: *Deleted

## 2017-04-02 ENCOUNTER — Other Ambulatory Visit: Payer: Self-pay | Admitting: *Deleted

## 2017-04-02 DIAGNOSIS — E782 Mixed hyperlipidemia: Secondary | ICD-10-CM

## 2017-05-15 ENCOUNTER — Encounter: Payer: Self-pay | Admitting: *Deleted

## 2017-05-15 NOTE — Progress Notes (Signed)
Cardiology Office Note  Date: 05/18/2017   ID: [name], [name] 04-03-1956, MRN [id number]  PCP: Rory Percy, MD  Primary Cardiologist: Rozann Lesches, MD   Chief Complaint  Patient presents with  . Coronary Artery Disease    History of Present Illness: CHRITOPHER COSTER is a 61 y.o. male last seen in November 2017. He presents for a routine follow-up visit. Reports no angina symptoms nitroglycerin use on current medical treatment. 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  We went over his current medications which are outlined below. He reports compliance with Lipitor and had follow-up lab work done this morning, results pending.  Blood pressure is well controlled today on Cozaar and Lopressor.  Past Medical History:  Diagnosis Date  . Arthritis   . Coronary atherosclerosis of native coronary artery    a. NSTEMI (02/2014):  LHC (02/2014):  Mild disease in LAD and CFX; prox RCA occluded with R-R collats, dist RCA filled by L-R collats, inf HK, EF 55%, LVEDP 15 mmHg.  PCI:  Unsuccessful angioplasty of RCA (late presentation of inf MI) - tx medically.  . Essential hypertension, benign   . GERD (gastroesophageal reflux disease)   . Hyperlipidemia   . NSTEMI (non-ST elevated myocardial infarction) ([place]) 03/11/14  . Renal cell carcinoma of right kidney (HCC)    Partial nephrectomy in 2013    Past Surgical History:  Procedure Laterality Date  . BACK SURGERY     1989  . LEFT HEART CATHETERIZATION WITH CORONARY ANGIOGRAM N/A 03/17/2014   Procedure: LEFT HEART CATHETERIZATION WITH CORONARY ANGIOGRAM;  Surgeon: Jettie Booze, MD;  Location: [facility] CATH LAB;  Service: Cardiovascular;  Laterality: N/A;  . ROBOT ASSISTED LAPAROSCOPIC NEPHRECTOMY  01/14/2012   Procedure: ROBOTIC ASSISTED LAPAROSCOPIC NEPHRECTOMY;  Surgeon: Molli Hazard, MD;  [facility];  Service: Urology;  Laterality: Right;  Robot Laparoscopic Right Partial Nephrectomy      . TONSILLECTOMY      Current Outpatient Prescriptions  Medication Sig Dispense Refill  . aspirin EC 81 MG EC tablet Take 1 tablet (81 mg total) by mouth daily.    Marland Kitchen atorvastatin (LIPITOR) 80 MG tablet TAKE 1 TABLET BY MOUTH DAILY 30 tablet 6  . finasteride (PROSCAR) 5 MG tablet Take 5 mg by mouth daily.  3  . losartan (COZAAR) 25 MG tablet TAKE 1 TABLET BY MOUTH EVERY DAY (STOP LISINOPRIL) 90 tablet 3  . metoprolol tartrate (LOPRESSOR) 25 MG tablet TAKE 1 TABLET BY MOUTH TWICE DAILY 180 tablet 0  . nitroGLYCERIN (NITROSTAT) 0.4 MG SL tablet Place 1 tablet (0.4 mg total) under the tongue every 5 (five) minutes x 3 doses as needed for chest pain. 25 tablet 12   No current facility-administered medications for this visit.    Allergies:  Alcohol   Social History: The patient  reports that he quit smoking about 37 years ago. His smoking use included Cigarettes. He has never used smokeless tobacco. He reports that he does not drink alcohol or use drugs.   ROS:  Please see the history of present illness. Otherwise, complete review of systems is positive for none.  All other systems are reviewed and negative.   Physical Exam: VS:  BP 116/86   Pulse 67   Ht 5\' 8"  (1.727 m)   Wt 188 lb 12.8 oz (85.6 kg)   SpO2 98%   BMI 28.71 kg/m , BMI Body mass index is 28.71 kg/m.  Wt Readings from Last 3 Encounters:  05/18/17 188 lb 12.8 oz (85.6 kg)  10/01/16 190 lb 6.4 oz (86.4 kg)  04/01/16 197 lb (89.4 kg)    Patient in no distress.  HEENT: Conjunctiva and lids normal, oropharynx clear.  Neck: Supple, no elevated JVP or carotid bruits, no thyromegaly.  Lungs: Clear to auscultation, nonlabored breathing at rest.  Cardiac: Regular rate and rhythm, no S3 or significant systolic murmur, no pericardial rub.  Abdomen: Soft, nontender, bowel sounds present, no guarding or rebound.  Extremities: No pitting edema, distal pulses 2+. Skin: Warm and dry. Musculoskeletal: No  kyphosis. Neuropsychiatric: Alert and oriented 3, affect appropriate.   ECG: I personally reviewed the tracing from 10/01/2016 which showed sinus bradycardia with nonspecific T-wave changes.  Recent Labwork:    Component Value Date/Time   CHOL 121 (L) 04/16/2016 1128   TRIG 81 04/16/2016 1128   HDL 43 04/16/2016 1128   CHOLHDL 2.8 04/16/2016 1128   VLDL 16 04/16/2016 1128   LDLCALC 62 04/16/2016 1128    Other Studies Reviewed Today:  Cardiac catheterization with attempted PTCA 03/17/2014: IMPRESSIONS:  1. Normal left main coronary artery. 2. Mild disease in the left anterior descending artery and its branches. 3. Mild disease in the left circumflex artery and its branches. 4. Occluded right coronary artery. PTCA of the proximal right coronary artery. This did not improve visualization of the RCA. Distal RCA fills by left to right, and right to right collaterals. 5. Overall normal left ventricular systolic function. LVEDP 15 mmHg. Ejection fraction 55 %.           6. Mildly dilated aortic root.  Assessment and Plan:  1. CAD with known occlusion of the RCA associated with right to right collaterals and left-to-right collaterals. He continues to do well without active angina on medical therapy. No changes were made today.  2. Hyperlipidemia, continues on Lipitor. Follow-up lipids pending.  3. Essential hypertension, blood pressure is well controlled today.  Current medicines were reviewed with the patient today.  Disposition: Follow-up in 6 months.  Signed, Satira Sark, MD, [facility] 05/18/2017 3:58 PM    [place] at [place], [place], [place] [address] Phone: [phone]; Fax: [phone]

## 2017-05-18 ENCOUNTER — Encounter: Payer: Self-pay | Admitting: Cardiology

## 2017-05-18 ENCOUNTER — Ambulatory Visit (INDEPENDENT_AMBULATORY_CARE_PROVIDER_SITE_OTHER): Admitting: Cardiology

## 2017-05-18 ENCOUNTER — Other Ambulatory Visit: Payer: Self-pay | Admitting: Cardiology

## 2017-05-18 VITALS — BP 116/86 | HR 67 | Ht 68.0 in | Wt 188.8 lb

## 2017-05-18 DIAGNOSIS — E782 Mixed hyperlipidemia: Secondary | ICD-10-CM

## 2017-05-18 DIAGNOSIS — I2510 Atherosclerotic heart disease of native coronary artery without angina pectoris: Secondary | ICD-10-CM | POA: Diagnosis not present

## 2017-05-18 DIAGNOSIS — I10 Essential (primary) hypertension: Secondary | ICD-10-CM | POA: Diagnosis not present

## 2017-05-18 LAB — LIPID PANEL
Cholesterol: 122 mg/dL (ref <200)
HDL: 37 mg/dL — ABNORMAL LOW (ref >40)
LDL Cholesterol: 60 mg/dL (ref <100)
TRIGLYCERIDES: 125 mg/dL (ref <150)
Total CHOL/HDL Ratio: 3.3 Ratio (ref <5.0)
VLDL: 25 mg/dL (ref <30)

## 2017-05-18 NOTE — Patient Instructions (Signed)

## 2017-05-19 ENCOUNTER — Telehealth: Payer: Self-pay

## 2017-05-19 NOTE — Telephone Encounter (Signed)
-----   Message from Acquanetta Chain, LPN sent at 3/0/7460  8:59 AM EDT -----   ----- Message ----- From: Satira Sark, MD Sent: 05/19/2017   8:26 AM To: Merlene Laughter, LPN  Results reviewed. LDL still well controlled at 60. No changes to current regimen. A copy of this test should be forwarded to Rory Percy, MD.

## 2017-05-19 NOTE — Telephone Encounter (Signed)
Patient notified. Routed to PCP 

## 2017-05-19 NOTE — Telephone Encounter (Signed)
-----   Message from Acquanetta Chain, LPN sent at 11/25/4035  8:59 AM EDT -----   ----- Message ----- From: Satira Sark, MD Sent: 05/19/2017   8:26 AM To: Merlene Laughter, LPN  Results reviewed. LDL still well controlled at 60. No changes to current regimen. A copy of this test should be forwarded to Rory Percy, MD.

## 2017-06-25 ENCOUNTER — Other Ambulatory Visit: Payer: Self-pay | Admitting: Cardiology

## 2017-07-15 ENCOUNTER — Other Ambulatory Visit: Payer: Self-pay | Admitting: Cardiology

## 2017-11-16 ENCOUNTER — Encounter: Payer: Self-pay | Admitting: Cardiology

## 2017-11-16 ENCOUNTER — Other Ambulatory Visit: Payer: Self-pay

## 2017-11-16 ENCOUNTER — Ambulatory Visit (INDEPENDENT_AMBULATORY_CARE_PROVIDER_SITE_OTHER): Admitting: Cardiology

## 2017-11-16 VITALS — BP 142/89 | HR 57 | Ht 68.0 in | Wt 188.4 lb

## 2017-11-16 DIAGNOSIS — I10 Essential (primary) hypertension: Secondary | ICD-10-CM | POA: Diagnosis not present

## 2017-11-16 DIAGNOSIS — E782 Mixed hyperlipidemia: Secondary | ICD-10-CM

## 2017-11-16 DIAGNOSIS — I25119 Atherosclerotic heart disease of native coronary artery with unspecified angina pectoris: Secondary | ICD-10-CM

## 2017-11-16 NOTE — Patient Instructions (Signed)
Medication Instructions:  Your physician recommends that you continue on your current medications as directed. Please refer to the Current Medication list given to you today.  Labwork: LIVER, LIPIDS Prior to 6 month follow up Orders given today  Testing/Procedures: NONE  Follow-Up: Your physician wants you to follow-up in: [place]. You will receive a reminder letter in the mail two months in advance. If you don't receive a letter, please call our office to schedule the follow-up appointment.  Any Other Special Instructions Will Be Listed Below (If Applicable).  If you need a refill on your cardiac medications before your next appointment, please call your pharmacy.

## 2017-11-16 NOTE — Progress Notes (Signed)
Cardiology Office Note  Date: 11/16/2017   ID: [name], [name] 01/15/1956, MRN [id number]  PCP: Rory Percy, MD  Primary Cardiologist: Rozann Lesches, MD   Chief Complaint  Patient presents with  . Coronary Artery Disease    History of Present Illness: [name] is a 61 y.o. male last seen in July. He presents today for a routine follow-up visit. He reports no angina symptoms and has stable NYHA class II dyspnea. He is now working for a [employer] company full time.  I went over his medications. He reports compliance and no intolerances. Blood pressure was elevated today which is unusual. He states that he has not been checking it regularly. Weight has been stable.  I personally reviewed his ECG today which shows sinus bradycardia with nonspecific T-wave abnormalities.  Lipid panel from this summer is outlined below.  Past Medical History:  Diagnosis Date  . Arthritis   . Coronary atherosclerosis of native coronary artery    a. NSTEMI (02/2014):  LHC (02/2014):  Mild disease in LAD and CFX; prox RCA occluded with R-R collats, dist RCA filled by L-R collats, inf HK, EF 55%, LVEDP 15 mmHg.  PCI:  Unsuccessful angioplasty of RCA (late presentation of inf MI) - tx medically.  . Essential hypertension, benign   . GERD (gastroesophageal reflux disease)   . Hyperlipidemia   . NSTEMI (non-ST elevated myocardial infarction) ([place]) 03/11/14  . Renal cell carcinoma of right kidney (HCC)    Partial nephrectomy in 2013    Past Surgical History:  Procedure Laterality Date  . BACK SURGERY     1989  . LEFT HEART CATHETERIZATION WITH CORONARY ANGIOGRAM N/A 03/17/2014   Procedure: LEFT HEART CATHETERIZATION WITH CORONARY ANGIOGRAM;  Surgeon: Jettie Booze, MD;  Location: [facility] CATH LAB;  Service: Cardiovascular;  Laterality: N/A;  . ROBOT ASSISTED LAPAROSCOPIC NEPHRECTOMY  01/14/2012   Procedure: ROBOTIC ASSISTED LAPAROSCOPIC NEPHRECTOMY;  Surgeon: Molli Hazard, MD;   Location: [facility];  Service: Urology;  Laterality: Right;  Robot Laparoscopic Right Partial Nephrectomy    . TONSILLECTOMY      Current Outpatient Medications  Medication Sig Dispense Refill  . aspirin EC 81 MG EC tablet Take 1 tablet (81 mg total) by mouth daily.    Marland Kitchen atorvastatin (LIPITOR) 80 MG tablet TAKE 1 TABLET BY MOUTH EVERY DAY 30 tablet 6  . finasteride (PROSCAR) 5 MG tablet Take 5 mg by mouth daily.  3  . losartan (COZAAR) 25 MG tablet TAKE 1 TABLET BY MOUTH EVERY DAY (STOP LISINOPRIL) 90 tablet 3  . metoprolol tartrate (LOPRESSOR) 25 MG tablet TAKE ONE TABLET BY MOUTH TWICE DAILY 180 tablet 1  . nitroGLYCERIN (NITROSTAT) 0.4 MG SL tablet Place 1 tablet (0.4 mg total) under the tongue every 5 (five) minutes x 3 doses as needed for chest pain. 25 tablet 12   No current facility-administered medications for this visit.    Allergies:  Alcohol   Social History: The patient  reports that he quit smoking about 38 years ago. His smoking use included cigarettes. he has never used smokeless tobacco. He reports that he does not drink alcohol or use drugs.   ROS:  Please see the history of present illness. Otherwise, complete review of systems is positive for none.  All other systems are reviewed and negative.   Physical Exam: VS:  BP (!) 142/89 (BP Location: Right Arm, Cuff Size: Normal)   Pulse (!) 57   Ht 5\' 8"  (1.727  m)   Wt 188 lb 6.4 oz (85.5 kg)   BMI 28.65 kg/m , BMI Body mass index is 28.65 kg/m.  Wt Readings from Last 3 Encounters:  11/16/17 188 lb 6.4 oz (85.5 kg)  05/18/17 188 lb 12.8 oz (85.6 kg)  10/01/16 190 lb 6.4 oz (86.4 kg)    General: Patient appears comfortable at rest. HEENT: Conjunctiva and lids normal, oropharynx clear. Neck: Supple, no elevated JVP or carotid bruits, no thyromegaly. Lungs: Clear to auscultation, nonlabored breathing at rest. Cardiac: Regular rate and rhythm, no S3 or significant systolic murmur, no pericardial rub. Abdomen: Soft,  nontender, bowel sounds present. Extremities: No pitting edema, distal pulses 2+. Skin: Warm and dry. Musculoskeletal: No kyphosis. Neuropsychiatric: Alert and oriented x3, affect grossly appropriate.  ECG: I personally reviewed the tracing from 10/01/2016 which showed sinus bradycardia with nonspecific T-wave abnormalities.  Recent Labwork:    Component Value Date/Time   CHOL 122 05/18/2017 0913   TRIG 125 05/18/2017 0913   HDL 37 (L) 05/18/2017 0913   CHOLHDL 3.3 05/18/2017 0913   VLDL 25 05/18/2017 0913   LDLCALC 60 05/18/2017 0913    Other Studies Reviewed Today:  Cardiac catheterization with attempted PTCA 03/17/2014: IMPRESSIONS:  1. Normal left main coronary artery. 2. Mild disease in the left anterior descending artery and its branches. 3. Mild disease in the left circumflex artery and its branches. 4. Occluded right coronary artery. PTCA of the proximal right coronary artery. This did not improve visualization of the RCA. Distal RCA fills by left to right, and right to right collaterals. 5. Overall normal left ventricular systolic function. LVEDP 15 mmHg. Ejection fraction 55 %. 6. Mildly dilated aortic root.  Assessment and Plan:  1. Subjective medically stable CAD with known occlusion of the RCA associated with good collateral filling based on prior workup. Plan to continue present medications. He is describing activities exceeding 4 METs without angina. ECG reviewed and stable.  2. Hyperlipidemia, continues on Lipitor. Follow-up FLP and LFTs for next visit.  3. Essential hypertension. Blood pressure is elevated today. He does have a blood pressure cuff at home, I asked him to start tracking this over the next few weeks. If trend is up, he may need up titration of Cozaar.  Current medicines were reviewed with the patient today.   Orders Placed This Encounter  Procedures  . Hepatic function panel  . Lipid Profile  . EKG 12-Lead    Disposition:  Follow-up in 6 months.  Signed, Satira Sark, MD, [facility] 11/16/2017 1:17 PM    [place] at [place], [place], [place] [address] Phone: [phone]; Fax: [phone]

## 2017-12-17 ENCOUNTER — Other Ambulatory Visit: Payer: Self-pay | Admitting: Cardiology

## 2018-02-16 ENCOUNTER — Other Ambulatory Visit: Payer: Self-pay | Admitting: Cardiology

## 2018-05-20 NOTE — Progress Notes (Deleted)
Cardiology Office Note  Date: 05/20/2018   ID: [name], [name] May 30, 1956, MRN [id number]  PCP: Rory Percy, MD  Primary Cardiologist: Rozann Lesches, MD   No chief complaint on file.   History of Present Illness: [name] is a 62 y.o. male last seen in December 2018.  Past Medical History:  Diagnosis Date  . Arthritis   . Coronary atherosclerosis of native coronary artery    a. NSTEMI (02/2014):  LHC (02/2014):  Mild disease in LAD and CFX; prox RCA occluded with R-R collats, dist RCA filled by L-R collats, inf HK, EF 55%, LVEDP 15 mmHg.  PCI:  Unsuccessful angioplasty of RCA (late presentation of inf MI) - tx medically.  . Essential hypertension, benign   . GERD (gastroesophageal reflux disease)   . Hyperlipidemia   . NSTEMI (non-ST elevated myocardial infarction) ([place]) 03/11/14  . Renal cell carcinoma of right kidney (HCC)    Partial nephrectomy in 2013    Past Surgical History:  Procedure Laterality Date  . BACK SURGERY     1989  . LEFT HEART CATHETERIZATION WITH CORONARY ANGIOGRAM N/A 03/17/2014   Procedure: LEFT HEART CATHETERIZATION WITH CORONARY ANGIOGRAM;  Surgeon: Jettie Booze, MD;  Location: [facility] CATH LAB;  Service: Cardiovascular;  Laterality: N/A;  . ROBOT ASSISTED LAPAROSCOPIC NEPHRECTOMY  01/14/2012   Procedure: ROBOTIC ASSISTED LAPAROSCOPIC NEPHRECTOMY;  Surgeon: Molli Hazard, MD;  Location: [facility] ORS;  Service: Urology;  Laterality: Right;  Robot Laparoscopic Right Partial Nephrectomy    . TONSILLECTOMY      Current Outpatient Medications  Medication Sig Dispense Refill  . aspirin EC 81 MG EC tablet Take 1 tablet (81 mg total) by mouth daily.    Marland Kitchen atorvastatin (LIPITOR) 80 MG tablet TAKE 1 TABLET BY MOUTH EVERY DAY 30 tablet 6  . finasteride (PROSCAR) 5 MG tablet Take 5 mg by mouth daily.  3  . losartan (COZAAR) 25 MG tablet TAKE 1 TABLET BY MOUTH EVERY DAY (STOP LISINOPRIL) 90 tablet 3  . metoprolol tartrate (LOPRESSOR) 25 MG  tablet TAKE ONE TABLET BY MOUTH TWICE DAILY 180 tablet 3  . nitroGLYCERIN (NITROSTAT) 0.4 MG SL tablet Place 1 tablet (0.4 mg total) under the tongue every 5 (five) minutes x 3 doses as needed for chest pain. 25 tablet 12   No current facility-administered medications for this visit.    Allergies:  Alcohol   Social History: The patient  reports that he quit smoking about 38 years ago. His smoking use included cigarettes. He has never used smokeless tobacco. He reports that he does not drink alcohol or use drugs.   Family History: The patient's family history is not on file.   ROS:  Please see the history of present illness. Otherwise, complete review of systems is positive for {NONE DEFAULTED:18576::"none"}.  All other systems are reviewed and negative.   Physical Exam: VS:  There were no vitals taken for this visit., BMI There is no height or weight on file to calculate BMI.  Wt Readings from Last 3 Encounters:  11/16/17 188 lb 6.4 oz (85.5 kg)  05/18/17 188 lb 12.8 oz (85.6 kg)  10/01/16 190 lb 6.4 oz (86.4 kg)    General: Patient appears comfortable at rest. HEENT: Conjunctiva and lids normal, oropharynx clear with moist mucosa. Neck: Supple, no elevated JVP or carotid bruits, no thyromegaly. Lungs: Clear to auscultation, nonlabored breathing at rest. Cardiac: Regular rate and rhythm, no S3 or significant systolic murmur, no pericardial rub. Abdomen:  Soft, nontender, no hepatomegaly, bowel sounds present, no guarding or rebound. Extremities: No pitting edema, distal pulses 2+. Skin: Warm and dry. Musculoskeletal: No kyphosis. Neuropsychiatric: Alert and oriented x3, affect grossly appropriate.  ECG: I personally reviewed the tracing from 11/16/2017 which showed sinus bradycardia with nonspecific T-wave changes.  Recent Labwork: No results found for requested labs within last 8760 hours.     Component Value Date/Time   CHOL 122 05/18/2017 0913   TRIG 125 05/18/2017 0913    HDL 37 (L) 05/18/2017 0913   CHOLHDL 3.3 05/18/2017 0913   VLDL 25 05/18/2017 0913   LDLCALC 60 05/18/2017 0913    Other Studies Reviewed Today:  Cardiac catheterization with attempted PTCA 03/17/2014: IMPRESSIONS:  1. Normal left main coronary artery. 2. Mild disease in the left anterior descending artery and its branches. 3. Mild disease in the left circumflex artery and its branches. 4. Occluded right coronary artery. PTCA of the proximal right coronary artery. This did not improve visualization of the RCA. Distal RCA fills by left to right, and right to right collaterals. 5. Overall normal left ventricular systolic function. LVEDP 15 mmHg. Ejection fraction 55 %. 6. Mildly dilated aortic root.  Assessment and Plan:    Current medicines were reviewed with the patient today.  No orders of the defined types were placed in this encounter.   Disposition:  Signed, Satira Sark, MD, [facility] 05/20/2018 9:44 AM    [place] at [place], [place], [place] [address] Phone: [phone]; Fax: [phone]

## 2018-05-21 ENCOUNTER — Ambulatory Visit: Admitting: Cardiology

## 2018-05-25 ENCOUNTER — Encounter: Payer: Self-pay | Admitting: Cardiology

## 2018-07-12 ENCOUNTER — Other Ambulatory Visit: Payer: Self-pay | Admitting: Cardiology

## 2018-08-10 ENCOUNTER — Other Ambulatory Visit: Payer: Self-pay | Admitting: Cardiology

## 2018-08-25 ENCOUNTER — Telehealth: Payer: Self-pay | Admitting: Cardiology

## 2018-08-25 MED ORDER — LOSARTAN POTASSIUM 25 MG PO TABS: 25.0000 mg | ORAL_TABLET | Freq: Every day | ORAL | 6 refills | Status: DC

## 2018-08-25 NOTE — Telephone Encounter (Signed)
Done

## 2018-08-25 NOTE — Telephone Encounter (Signed)
°*  STAT* If patient is at the pharmacy, call can be transferred to refill team.   1. Which medications need to be refilled? losartan (COZAAR) 25 MG tablet    2. Which pharmacy/location (including street and city if local pharmacy) is medication to be sent to? [facility]  3. Do they need a 30 day or 90 day supply? 30 or 90 he has appt with Domenic Polite on Monday Aug 30, 2018  He only has two pills left

## 2018-08-27 NOTE — Progress Notes (Signed)
Cardiology Office Note  Date: 08/30/2018   ID: [name], [name] Jan 30, 1956, MRN [id number]  PCP: Rory Percy, MD  Primary Cardiologist: Rozann Lesches, MD   Chief Complaint  Patient presents with  . Coronary Artery Disease    History of Present Illness: [name] is a 62 y.o. male last seen in December 2018.  He presents today for routine visit.  States that he has been doing well, no angina symptoms or unusual shortness of breath.  Still working full-time for a [employer].  He has also started back playing golf regularly.  I went over his medications.  He states that he is having trouble remembering the evening medicines, Lipitor and second dose of Lopressor.  Blood pressure remains mildly elevated.  Current cardiac regimen includes aspirin, Lipitor, Cozaar, Lopressor and as needed nitroglycerin.  He is due for follow-up FLP and LFTs.  Past Medical History:  Diagnosis Date  . Arthritis   . Coronary atherosclerosis of native coronary artery    a. NSTEMI (02/2014):  LHC (02/2014):  Mild disease in LAD and CFX; prox RCA occluded with R-R collats, dist RCA filled by L-R collats, inf HK, EF 55%, LVEDP 15 mmHg.  PCI:  Unsuccessful angioplasty of RCA (late presentation of inf MI) - tx medically.  . Essential hypertension, benign   . GERD (gastroesophageal reflux disease)   . Hyperlipidemia   . NSTEMI (non-ST elevated myocardial infarction) ([place]) 03/11/14  . Renal cell carcinoma of right kidney (HCC)    Partial nephrectomy in 2013    Past Surgical History:  Procedure Laterality Date  . BACK SURGERY     1989  . LEFT HEART CATHETERIZATION WITH CORONARY ANGIOGRAM N/A 03/17/2014   Procedure: LEFT HEART CATHETERIZATION WITH CORONARY ANGIOGRAM;  Surgeon: Jettie Booze, MD;  Location: [facility] CATH LAB;  Service: Cardiovascular;  Laterality: N/A;  . ROBOT ASSISTED LAPAROSCOPIC NEPHRECTOMY  01/14/2012   Procedure: ROBOTIC ASSISTED LAPAROSCOPIC NEPHRECTOMY;  Surgeon:  Molli Hazard, MD;  Location: [facility];  Service: Urology;  Laterality: Right;  Robot Laparoscopic Right Partial Nephrectomy    . TONSILLECTOMY      Current Outpatient Medications  Medication Sig Dispense Refill  . aspirin EC 81 MG EC tablet Take 1 tablet (81 mg total) by mouth daily.    Marland Kitchen atorvastatin (LIPITOR) 80 MG tablet TAKE 1 TABLET BY MOUTH EVERY DAY 30 tablet 6  . finasteride (PROSCAR) 5 MG tablet Take 5 mg by mouth daily.  3  . losartan (COZAAR) 50 MG tablet Take 1 tablet (50 mg total) by mouth daily. 30 tablet 6  . nitroGLYCERIN (NITROSTAT) 0.4 MG SL tablet Place 1 tablet (0.4 mg total) under the tongue every 5 (five) minutes x 3 doses as needed for chest pain. 25 tablet 12  . metoprolol succinate (TOPROL XL) 25 MG 24 hr tablet Take 1 tablet (25 mg total) by mouth daily. 30 tablet 6   No current facility-administered medications for this visit.    Allergies:  Alcohol   Social History: The patient  reports that he quit smoking about 38 years ago. His smoking use included cigarettes. He has never used smokeless tobacco. He reports that he does not drink alcohol or use drugs.   ROS:  Please see the history of present illness. Otherwise, complete review of systems is positive for none.  All other systems are reviewed and negative.   Physical Exam: VS:  BP (!) 150/90   Pulse (!) 54   Ht  5\' 8"  (1.727 m)   Wt 190 lb (86.2 kg)   SpO2 98%   BMI 28.89 kg/m , BMI Body mass index is 28.89 kg/m.  Wt Readings from Last 3 Encounters:  08/30/18 190 lb (86.2 kg)  11/16/17 188 lb 6.4 oz (85.5 kg)  05/18/17 188 lb 12.8 oz (85.6 kg)    General: Patient appears comfortable at rest. HEENT: Conjunctiva and lids normal, oropharynx clear. Neck: Supple, no elevated JVP or carotid bruits, no thyromegaly. Lungs: Clear to auscultation, nonlabored breathing at rest. Cardiac: Regular rate and rhythm, no S3 or significant systolic murmur. Abdomen: Soft, nontender, bowel sounds  present. Extremities: No pitting edema, distal pulses 2+. Skin: Warm and dry. Musculoskeletal: No kyphosis. Neuropsychiatric: Alert and oriented x3, affect grossly appropriate.  ECG: I personally reviewed the tracing from 11/16/2017 which showed sinus bradycardia with nonspecific T wave abnormalities.   Recent Labwork:    Component Value Date/Time   CHOL 122 05/18/2017 0913   TRIG 125 05/18/2017 0913   HDL 37 (L) 05/18/2017 0913   CHOLHDL 3.3 05/18/2017 0913   VLDL 25 05/18/2017 0913   LDLCALC 60 05/18/2017 0913    Other Studies Reviewed Today:  Cardiac catheterization with attempted PTCA 03/17/2014: IMPRESSIONS:  1. Normal left main coronary artery. 2. Mild disease in the left anterior descending artery and its branches. 3. Mild disease in the left circumflex artery and its branches. 4. Occluded right coronary artery. PTCA of the proximal right coronary artery. This did not improve visualization of the RCA. Distal RCA fills by left to right, and right to right collaterals. 5. Overall normal left ventricular systolic function. LVEDP 15 mmHg. Ejection fraction 55 %. 6. Mildly dilated aortic root.  Assessment and Plan:  1.  CAD with history of occluded RCA associated with collateral filling, no active angina symptoms and normal LVEF.  No clear indication for follow-up ischemic testing at this time.  He will continue aspirin and statin therapy.  2.  Essential hypertension, blood pressure not optimally controlled.  Increase losartan to 50 mg daily.  Follow-up BMET in 2 weeks.  Also change Lopressor to Toprol-XL so that he can take it only once a day.  3.  Mixed hyperlipidemia.  Plans to take Lipitor in the morning so he does not forget.  Follow-up FLP and LFTs with BMET.  4.  GERD, no active symptoms, not on PPI at this time.  Current medicines were reviewed with the patient today.   Orders Placed This Encounter  Procedures  . Comprehensive metabolic panel  .  Lipid panel    Disposition: Follow-up in 6 months.  Signed, Satira Sark, MD, [facility] 08/30/2018 9:23 AM    [place] at [place], [place], [place] [address] Phone: [phone]; Fax: [phone]

## 2018-08-30 ENCOUNTER — Ambulatory Visit (INDEPENDENT_AMBULATORY_CARE_PROVIDER_SITE_OTHER): Admitting: Cardiology

## 2018-08-30 ENCOUNTER — Encounter: Payer: Self-pay | Admitting: Cardiology

## 2018-08-30 VITALS — BP 150/90 | HR 54 | Ht 68.0 in | Wt 190.0 lb

## 2018-08-30 DIAGNOSIS — E782 Mixed hyperlipidemia: Secondary | ICD-10-CM

## 2018-08-30 DIAGNOSIS — K219 Gastro-esophageal reflux disease without esophagitis: Secondary | ICD-10-CM

## 2018-08-30 DIAGNOSIS — I10 Essential (primary) hypertension: Secondary | ICD-10-CM | POA: Diagnosis not present

## 2018-08-30 DIAGNOSIS — I25119 Atherosclerotic heart disease of native coronary artery with unspecified angina pectoris: Secondary | ICD-10-CM

## 2018-08-30 MED ORDER — METOPROLOL SUCCINATE ER 25 MG PO TB24: 25.0000 mg | ORAL_TABLET | Freq: Every day | ORAL | 6 refills | Status: DC

## 2018-08-30 MED ORDER — LOSARTAN POTASSIUM 50 MG PO TABS: 50.0000 mg | ORAL_TABLET | Freq: Every day | ORAL | 6 refills | Status: DC

## 2018-08-30 NOTE — Patient Instructions (Signed)
Medication Instructions:   Increase Cozaar to 50mg  daily.   Stop Lopressor.   Begin Toprol XL 25mg  daily.   Continue all other medications.    Labwork:  CMET, Lipids - orders given today.  Please do labs in 2 weeks  Office will contact with results via phone or letter.    Testing/Procedures: none  Follow-Up: Your physician wants you to follow up in: 6 months.  You will receive a reminder letter in the mail one-two months in advance.  If you don't receive a letter, please call our office to schedule the follow up appointment   Any Other Special Instructions Will Be Listed Below (If Applicable).  If you need a refill on your cardiac medications before your next appointment, please call your pharmacy.

## 2018-09-16 LAB — LIPID PANEL
CHOL/HDL RATIO: 3 (calc) (ref <5.0)
Cholesterol: 130 mg/dL (ref <200)
HDL: 44 mg/dL (ref >40)
LDL Cholesterol (Calc): 71 mg/dL (calc)
NON-HDL CHOLESTEROL (CALC): 86 mg/dL (ref <130)
Triglycerides: 70 mg/dL (ref <150)

## 2018-09-16 LAB — COMPREHENSIVE METABOLIC PANEL
AG Ratio: 1.5 (calc) (ref 1.0–2.5)
ALBUMIN MSPROF: 4 g/dL (ref 3.6–5.1)
ALT: 22 U/L (ref 9–46)
AST: 19 U/L (ref 10–35)
Alkaline phosphatase (APISO): 79 U/L (ref 40–115)
BUN: 15 mg/dL (ref 7–25)
CO2: 27 mmol/L (ref 20–32)
CREATININE: 0.96 mg/dL (ref 0.70–1.25)
Calcium: 9.4 mg/dL (ref 8.6–10.3)
Chloride: 106 mmol/L (ref 98–110)
GLUCOSE: 100 mg/dL — AB (ref 65–99)
Globulin: 2.6 g/dL (calc) (ref 1.9–3.7)
POTASSIUM: 5.3 mmol/L (ref 3.5–5.3)
SODIUM: 141 mmol/L (ref 135–146)
TOTAL PROTEIN: 6.6 g/dL (ref 6.1–8.1)
Total Bilirubin: 0.5 mg/dL (ref 0.2–1.2)

## 2018-09-17 ENCOUNTER — Telehealth: Payer: Self-pay | Admitting: *Deleted

## 2018-09-17 NOTE — Telephone Encounter (Signed)
-----   Message from Satira Sark, MD sent at 09/17/2018  8:20 AM EDT ----- Results reviewed.  Lipids look good overall with total cholesterol 130 and LDL 71.  LFTs are normal.  Renal function also normal after recent change in medications.  Potassium high normal, need to keep an eye on this.  Would get BMET for follow-up visit. A copy of this test should be forwarded to Rory Percy, MD.

## 2018-09-24 ENCOUNTER — Encounter: Payer: Self-pay | Admitting: *Deleted

## 2018-09-24 NOTE — Telephone Encounter (Signed)
Letter mailed requesting patient contact our office.

## 2018-10-03 ENCOUNTER — Other Ambulatory Visit: Payer: Self-pay | Admitting: Cardiology

## 2018-10-05 NOTE — Telephone Encounter (Signed)
Patient came to office and results given. Verbalized understanding of plan. Copy sent to PCP.

## 2019-03-31 ENCOUNTER — Other Ambulatory Visit: Payer: Self-pay | Admitting: Cardiology

## 2019-05-01 ENCOUNTER — Other Ambulatory Visit: Payer: Self-pay | Admitting: Cardiology

## 2019-07-07 ENCOUNTER — Other Ambulatory Visit: Payer: Self-pay | Admitting: Cardiology

## 2019-08-02 ENCOUNTER — Other Ambulatory Visit: Payer: Self-pay | Admitting: Cardiology

## 2019-08-31 ENCOUNTER — Other Ambulatory Visit: Payer: Self-pay | Admitting: Cardiology

## 2019-09-28 ENCOUNTER — Other Ambulatory Visit: Payer: Self-pay | Admitting: Cardiology

## 2019-10-06 ENCOUNTER — Other Ambulatory Visit: Payer: Self-pay | Admitting: Cardiology

## 2019-10-06 ENCOUNTER — Telehealth: Payer: Self-pay | Admitting: *Deleted

## 2019-10-06 DIAGNOSIS — I10 Essential (primary) hypertension: Secondary | ICD-10-CM

## 2019-10-06 MED ORDER — ATORVASTATIN CALCIUM 80 MG PO TABS: ORAL_TABLET | ORAL | 0 refills | Status: DC

## 2019-10-06 NOTE — Telephone Encounter (Signed)
°*  STAT* If patient is at the pharmacy, call can be transferred to refill team.   1. Which medications need to be refilled? atorvastatin (LIPITOR) 80 MG tablet   2. Which pharmacy/location (including street and city if local pharmacy) is medication to be sent to? [facility]   3. Do they need a 30 day or 90 day supply?

## 2019-10-06 NOTE — Telephone Encounter (Signed)
Patient informed that lab order for bmet faxed to [facility].

## 2019-10-11 ENCOUNTER — Other Ambulatory Visit: Payer: Self-pay | Admitting: *Deleted

## 2019-10-11 MED ORDER — ATORVASTATIN CALCIUM 80 MG PO TABS: ORAL_TABLET | ORAL | 0 refills | Status: DC

## 2019-10-17 LAB — BASIC METABOLIC PANEL
BUN: 17 mg/dL (ref 7–25)
CO2: 29 mmol/L (ref 20–32)
Calcium: 9.2 mg/dL (ref 8.6–10.3)
Chloride: 105 mmol/L (ref 98–110)
Creat: 0.95 mg/dL (ref 0.70–1.25)
Glucose, Bld: 103 mg/dL — ABNORMAL HIGH (ref 65–99)
Potassium: 5.3 mmol/L (ref 3.5–5.3)
Sodium: 141 mmol/L (ref 135–146)

## 2019-10-18 ENCOUNTER — Telehealth: Payer: Self-pay | Admitting: *Deleted

## 2019-10-18 NOTE — Telephone Encounter (Signed)
-----   Message from Satira Sark, MD sent at 10/17/2019  2:23 PM EST ----- Results reviewed.  Follow-up lab work shows normal renal function and potassium.

## 2019-10-18 NOTE — Telephone Encounter (Signed)
Patient informed. Copy sent to PCP °

## 2019-10-21 ENCOUNTER — Telehealth (INDEPENDENT_AMBULATORY_CARE_PROVIDER_SITE_OTHER): Admitting: Cardiology

## 2019-10-21 ENCOUNTER — Encounter: Payer: Self-pay | Admitting: Cardiology

## 2019-10-21 VITALS — BP 183/113 | HR 58 | Ht 68.0 in | Wt 185.0 lb

## 2019-10-21 DIAGNOSIS — I10 Essential (primary) hypertension: Secondary | ICD-10-CM

## 2019-10-21 DIAGNOSIS — E782 Mixed hyperlipidemia: Secondary | ICD-10-CM | POA: Diagnosis not present

## 2019-10-21 DIAGNOSIS — Z7982 Long term (current) use of aspirin: Secondary | ICD-10-CM

## 2019-10-21 DIAGNOSIS — I2510 Atherosclerotic heart disease of native coronary artery without angina pectoris: Secondary | ICD-10-CM | POA: Diagnosis not present

## 2019-10-21 DIAGNOSIS — Z79899 Other long term (current) drug therapy: Secondary | ICD-10-CM

## 2019-10-21 DIAGNOSIS — I25119 Atherosclerotic heart disease of native coronary artery with unspecified angina pectoris: Secondary | ICD-10-CM

## 2019-10-21 DIAGNOSIS — I6521 Occlusion and stenosis of right carotid artery: Secondary | ICD-10-CM

## 2019-10-21 MED ORDER — CHLORTHALIDONE 25 MG PO TABS: 25.0000 mg | ORAL_TABLET | Freq: Every day | ORAL | 1 refills | Status: DC

## 2019-10-21 NOTE — Patient Instructions (Addendum)
Medication Instructions:   Your physician has recommended you make the following change in your medication:   Start chlorthalidone 25 mg by mouth daily-sent to pharmacy  Continue all other medications the same  Labwork:  NONE  Testing/Procedures:  NONE  Follow-Up:  Your physician recommends that you schedule a follow-up appointment in: 2 weeks for a blood pressure check. Please bring your home monitor to this visit as well as your home blood pressure readings.   You follow up with Dr. Domenic Polite will be determined after this nurse visit.  Any Other Special Instructions Will Be Listed Below (If Applicable).  If you need a refill on your cardiac medications before your next appointment, please call your pharmacy.  Your physician has requested that you regularly monitor and record your blood pressure readings at home. Please use the same machine at the same time of day to check your readings and record them to bring to your follow-up visit. Please get a home blood pressure monitor

## 2019-10-21 NOTE — Progress Notes (Signed)
Virtual Visit via Telephone Note   This visit type was conducted due to national recommendations for restrictions regarding the COVID-19 Pandemic (e.g. social distancing) in an effort to limit this patient's exposure and mitigate transmission in our community.  Due to his co-morbid illnesses, this patient is at least at moderate risk for complications without adequate follow up.  This format is felt to be most appropriate for this patient at this time.  The patient did not have access to video technology/had technical difficulties with video requiring transitioning to audio format only (telephone).  All issues noted in this document were discussed and addressed.  No physical exam could be performed with this format.  Please refer to the patient's chart for his  consent to telehealth for [facility].   Date:  10/21/2019   ID:  [name], [name] May 17, 1956, MRN [id number]  Patient Location: Home Provider Location: Office  PCP:  Rory Percy, MD  Cardiologist:  Rozann Lesches, MD Electrophysiologist:  None   Evaluation Performed:  Follow-Up Visit  Chief Complaint:  Cardiac follow-up  History of Present Illness:    [name] is a 63 y.o. male last seen in October 2019.  We spoke by phone today.  He tells me that he has been doing well, no obvious angina or nitroglycerin use.  He went over to his daughter's house today to use her blood pressure cuff and found his blood pressure to be quite high, he was surprised by this.  States that he did drink coffee this morning, regular instead of decaffeinated.  He has been taking his medications regularly.  I reviewed his recent lab work which is outlined below.  Creatinine was 0.95 and potassium 5.3.  We went over his medications.  He reports no intolerances on high-dose Lipitor.  Last LDL was 71.  He continues to follow at [facility].  The patient does not have symptoms concerning for COVID-19 infection (fever, chills, cough, or new  shortness of breath).    Past Medical History:  Diagnosis Date  . Arthritis   . Coronary atherosclerosis of native coronary artery    a. NSTEMI (02/2014):  LHC (02/2014):  Mild disease in LAD and CFX; prox RCA occluded with R-R collats, dist RCA filled by L-R collats, inf HK, EF 55%, LVEDP 15 mmHg.  PCI:  Unsuccessful angioplasty of RCA (late presentation of inf MI) - tx medically.  . Essential hypertension   . GERD (gastroesophageal reflux disease)   . Hyperlipidemia   . NSTEMI (non-ST elevated myocardial infarction) ([place]) 03/11/14  . Renal cell carcinoma of right kidney (HCC)    Partial nephrectomy in 2013   Past Surgical History:  Procedure Laterality Date  . BACK SURGERY     1989  . LEFT HEART CATHETERIZATION WITH CORONARY ANGIOGRAM N/A 03/17/2014   Procedure: LEFT HEART CATHETERIZATION WITH CORONARY ANGIOGRAM;  Surgeon: Jettie Booze, MD;  Location[facility] CATH LAB;  Service: Cardiovascular;  Laterality: N/A;  . ROBOT ASSISTED LAPAROSCOPIC NEPHRECTOMY  01/14/2012   Procedure: ROBOTIC ASSISTED LAPAROSCOPIC NEPHRECTOMY;  Surgeon: Molli Hazard, MD;  Location: [facility]S;  Service: Urology;  Laterality: Right;  Robot Laparoscopic Right Partial Nephrectomy    . TONSILLECTOMY       Current Meds  Medication Sig  . aspirin EC 81 MG EC tablet Take 1 tablet (81 mg total) by mouth daily.  Marland Kitchen atorvastatin (LIPITOR) 80 MG tablet TAKE 1 TABLET BY MOUTH EVERY DAY  . finasteride (PROSCAR) 5 MG tablet Take 5  mg by mouth daily.  Marland Kitchen losartan (COZAAR) 50 MG tablet TAKE 1 TABLET BY MOUTH DAILY  . metoprolol succinate (TOPROL-XL) 25 MG 24 hr tablet TAKE 1 TABLET BY MOUTH DAILY  . nitroGLYCERIN (NITROSTAT) 0.4 MG SL tablet Place 1 tablet (0.4 mg total) under the tongue every 5 (five) minutes x 3 doses as needed for chest pain.  . [DISCONTINUED] isosorbide mononitrate (IMDUR) 30 MG 24 hr tablet Take 1 tablet (30 mg total) by mouth daily.     Allergies:   Alcohol   Social History   Tobacco Use   . Smoking status: Former Smoker    Types: Cigarettes    Quit date: 11/18/1979    Years since quitting: 39.9  . Smokeless tobacco: Never Used  Substance Use Topics  . Alcohol use: No    Alcohol/week: 0.0 standard drinks  . Drug use: No     Family Hx: The patient's family history is not on file.  ROS:   Please see the history of present illness.    Neuropathy symptoms mainly right-sided with history of back/disc problems. All other systems reviewed and are negative.   Prior CV studies:   The following studies were reviewed today:  Cardiac catheterization with attempted PTCA 03/17/2014: IMPRESSIONS:  1. Normal left main coronary artery. 2. Mild disease in the left anterior descending artery and its branches. 3. Mild disease in the left circumflex artery and its branches. 4. Occluded right coronary artery. PTCA of the proximal right coronary artery. This did not improve visualization of the RCA. Distal RCA fills by left to right, and right to right collaterals. 5. Overall normal left ventricular systolic function. LVEDP 15 mmHg. Ejection fraction 55 %. 6. Mildly dilated aortic root.  Labs/Other Tests and Data Reviewed:    EKG:  An ECG dated 11/16/2017 was personally reviewed today and demonstrated:  Sinus bradycardia with nonspecific T wave changes.  Recent Labs: 10/17/2019: BUN 17; Creat 0.95; Potassium 5.3; Sodium 141   Recent Lipid Panel Lab Results  Component Value Date/Time   CHOL 130 09/16/2018 08:56 AM   TRIG 70 09/16/2018 08:56 AM   HDL 44 09/16/2018 08:56 AM   CHOLHDL 3.0 09/16/2018 08:56 AM   LDLCALC 71 09/16/2018 08:56 AM    Wt Readings from Last 3 Encounters:  10/21/19 185 lb (83.9 kg)  08/30/18 190 lb (86.2 kg)  11/16/17 188 lb 6.4 oz (85.5 kg)     Objective:    Vital Signs:  BP (!) 183/113   Pulse (!) 58   Ht 5\' 8"  (1.727 m)   Wt 185 lb (83.9 kg)   BMI 28.13 kg/m    Patient spoke in full sentences, not short of breath. No  audible wheezing or coughing. Speech pattern normal.  ASSESSMENT & PLAN:    1.  CAD with known occlusion of the RCA associated with collaterals and otherwise mild left-sided disease.  We continue medical therapy and observation in the absence of angina symptoms.  He remains on aspirin, statin, beta-blocker, and ARB.  2.  Essential hypertension, blood pressure significantly elevated today.  I asked him to get a home blood pressure cuff for his own use, we will continue current medications and add chlorthalidone 25 mg daily.  He will have a nurse blood pressure check in the next few weeks and bring in his cuff as well.  Limit caffeine and sodium in the diet.  3.  Mixed hyperlipidemia, tolerating high-dose Lipitor with last LDL 71.  He continues to follow with  [facility].   COVID-19 Education: The signs and symptoms of COVID-19 were discussed with the patient and how to seek care for testing (follow up with PCP or arrange E-visit).  The importance of social distancing was discussed today.  Time:   Today, I have spent 9 minutes with the patient with telehealth technology discussing the above problems.     Medication Adjustments/Labs and Tests Ordered: Current medicines are reviewed at length with the patient today.  Concerns regarding medicines are outlined above.   Tests Ordered: No orders of the defined types were placed in this encounter.   Medication Changes: Meds ordered this encounter  Medications  . chlorthalidone (HYGROTON) 25 MG tablet    Sig: Take 1 tablet (25 mg total) by mouth daily.    Dispense:  90 tablet    Refill:  1    10/21/2019 NEW    Follow Up:  In Person Nurse visit in 2 weeks.  Signed, Rozann Lesches, MD  10/21/2019 9:46 AM    [place]

## 2019-11-03 ENCOUNTER — Encounter: Payer: Self-pay | Admitting: *Deleted

## 2019-11-03 ENCOUNTER — Other Ambulatory Visit: Payer: Self-pay

## 2019-11-03 ENCOUNTER — Ambulatory Visit (INDEPENDENT_AMBULATORY_CARE_PROVIDER_SITE_OTHER): Admitting: *Deleted

## 2019-11-03 VITALS — BP 114/80 | HR 70 | Ht 68.0 in | Wt 182.2 lb

## 2019-11-03 DIAGNOSIS — I10 Essential (primary) hypertension: Secondary | ICD-10-CM | POA: Diagnosis not present

## 2019-11-03 NOTE — Progress Notes (Signed)
Presents to office for nurse BP check per last tele-health visit.   Denies dizziness, chest pain or sob Has taken all doses of medications as prescribed without side effects. Medications reconciled Chlorthalidone 25 mg daily started on 10/29/2019  Home BP readings brought to office for review: 12/11 189/117  HR 66 12/13 165/108  HR 64 12/14 160/106  HR 67 12/15 161/109  HR 70 12/16 140/101  HR 68 12/17 147/104  HR 69 12/18 136/94    HR 70  Today's Home BP monitor BP reading 118/90 HR 73 & 2nd check 110/87 HR 72

## 2019-11-03 NOTE — Patient Instructions (Addendum)
Patient advised that he would be contacted with plan and follow up Continue monitoring home blood pressures  How to Take Your Blood Pressure You can take your blood pressure at home with a machine. You may need to check your blood pressure at home:  To check if you have high blood pressure (hypertension).  To check your blood pressure over time.  To make sure your blood pressure medicine is working. Supplies needed: You will need a blood pressure machine, or monitor. You can buy one at a drugstore or online. When choosing one:  Choose one with an arm cuff.  Choose one that wraps around your upper arm. Only one finger should fit between your arm and the cuff.  Do not choose one that measures your blood pressure from your wrist or finger. Your doctor can suggest a monitor. How to prepare Avoid these things for 30 minutes before checking your blood pressure:  Drinking caffeine.  Drinking alcohol.  Eating.  Smoking.  Exercising. Five minutes before checking your blood pressure:  Pee.  Sit in a dining chair. Avoid sitting in a soft couch or armchair.  Be quiet. Do not talk. How to take your blood pressure Follow the instructions that came with your machine. If you have a digital blood pressure monitor, these may be the instructions: 1. Sit up straight. 2. Place your feet on the floor. Do not cross your ankles or legs. 3. Rest your left arm at the level of your heart. You may rest it on a table, desk, or chair. 4. Pull up your shirt sleeve. 5. Wrap the blood pressure cuff around the upper part of your left arm. The cuff should be 1 inch (2.5 cm) above your elbow. It is best to wrap the cuff around bare skin. 6. Fit the cuff snugly around your arm. You should be able to place only one finger between the cuff and your arm. 7. Put the cord inside the groove of your elbow. 8. Press the power button. 9. Sit quietly while the cuff fills with air and loses air. 10. Write down the  numbers on the screen. 11. Wait 2-3 minutes and then repeat steps 1-10. What do the numbers mean? Two numbers make up your blood pressure. The first number is called systolic pressure. The second is called diastolic pressure. An example of a blood pressure reading is "120 over 80" (or 120/80). If you are an adult and do not have a medical condition, use this guide to find out if your blood pressure is normal: Normal  First number: below 120.  Second number: below 80. Elevated  First number: 120-129.  Second number: below 80. Hypertension stage 1  First number: 130-139.  Second number: 80-89. Hypertension stage 2  First number: 140 or above.  Second number: 42 or above. Your blood pressure is above normal even if only the top or bottom number is above normal. Follow these instructions at home:  Check your blood pressure as often as your doctor tells you to.  Take your monitor to your next doctor's appointment. Your doctor will: ? Make sure you are using it correctly. ? Make sure it is working right.  Make sure you understand what your blood pressure numbers should be.  Tell your doctor if your medicines are causing side effects. Contact a doctor if:  Your blood pressure keeps being high. Get help right away if:  Your first blood pressure number is higher than 180.  Your second blood pressure number  is higher than 120. This information is not intended to replace advice given to you by your health care provider. Make sure you discuss any questions you have with your health care provider. Document Released: 10/16/2008 Document Revised: 10/16/2017 Document Reviewed: 04/11/2016 Elsevier Patient Education  2020 [employer].

## 2019-11-04 NOTE — Progress Notes (Signed)
Per Dr. Domenic Polite, 6 month follow up as long as BP trend better.  [place]

## 2019-11-13 ENCOUNTER — Other Ambulatory Visit: Payer: Self-pay | Admitting: Cardiology

## 2019-11-14 ENCOUNTER — Telehealth: Payer: Self-pay | Admitting: Cardiology

## 2019-11-14 MED ORDER — ATORVASTATIN CALCIUM 80 MG PO TABS: 80.0000 mg | ORAL_TABLET | Freq: Every day | ORAL | 3 refills | Status: DC

## 2019-11-14 NOTE — Telephone Encounter (Signed)
Patient called stating that he is having side effects with the medication chlorthalidone (HYGROTON) 25 MG . Constipation, loss of appetite, dizzy and anxiety.

## 2019-11-14 NOTE — Telephone Encounter (Signed)
Thank you for the update.  If he is feeling better off chlorthalidone, we will not resume this for future management of high blood pressure.  Continue with losartan and Toprol-XL.  Would have him keep an eye on blood pressure and otherwise follow-up with PCP in the interim to see if further adjustments are needed.

## 2019-11-14 NOTE — Telephone Encounter (Signed)
Started Chlorthalidone about 1 1/2 month - dizziness, loss of appetite, constipation.  Stopped on his own on 11/10/2019.  Starting to feel better off of this medication - was feeling well enough to go back to work today.  Thinks that he stays borderline dehydrated anyway, says he doesn't typically drink a lot.    134/90 yesterday - running like this since not taking this medication.  Still taking the Losartan & Toprol XL.

## 2019-11-14 NOTE — Telephone Encounter (Signed)
Patient notified and verbalized understanding. 

## 2020-05-10 ENCOUNTER — Other Ambulatory Visit: Payer: Self-pay | Admitting: Cardiology

## 2020-06-08 ENCOUNTER — Other Ambulatory Visit: Payer: Self-pay | Admitting: Cardiology

## 2020-08-08 ENCOUNTER — Other Ambulatory Visit: Payer: Self-pay | Admitting: Cardiology

## 2020-08-30 ENCOUNTER — Other Ambulatory Visit: Payer: Self-pay | Admitting: Cardiology

## 2020-09-11 NOTE — Progress Notes (Signed)
Cardiology Office Note    Date:  09/12/2020   ID:  Zarion, Oliff 13-Jul-1956, MRN [id number]  PCP:  Rory Percy, MD  Cardiologist: Rozann Lesches, MD    Chief Complaint  Patient presents with  . Follow-up    Overdue 6 month visit    History of Present Illness:    DAVARION CUFFEE is a 64 y.o. male with past medical history of CAD (s/p NSTEMI in 02/2014 with cath showing occlusion of RCA filled by collaterals and unsuccessful PCI with medical management recommended), HTN, HLD and history of RCC (s/p partial nephrectomy in 2013) who presents to the office today for overdue 70-month follow-up.   He most recently had a telehealth visit with Dr. Domenic Polite in 10/2019 and denied any recent chest pain or dyspnea on exertion. He was continued on ASA, statin, BB and ARB with Chlorthalidone 25mg  daily being added to his medication regimen given his elevated BP. He called the office later that month reporting dizziness and constipation since starting the medication and had self-discontinued it. BP had been well-controlled off the medication, therefore he was continued on Toprol-XL 25mg  daily and Losartan 50mg  daily.   In talking with the patient today, his biggest concern at this time is bilateral leg weakness which is typically worse with activity. He denies any specific pain but says he feels like his legs are going to give out at times and he does experience associated paresthesias. No recent labs by his PCP.  He has baseline fatigue but denies any recent chest pain or dyspnea on exertion. No recent orthopnea, PND, lower extremity edema or palpitations.  He does not check his blood pressure regularly at home as he reports this was typically elevated in the past and rechecking it would make him feel stressed. He does report being under increased stress at this time as his daughter was recently started on HD.   Past Medical History:  Diagnosis Date  . Arthritis   . Coronary  atherosclerosis of native coronary artery    a. NSTEMI (02/2014):  LHC (02/2014):  Mild disease in LAD and CFX; prox RCA occluded with R-R collats, dist RCA filled by L-R collats, inf HK, EF 55%, LVEDP 15 mmHg.  PCI:  Unsuccessful angioplasty of RCA (late presentation of inf MI) - tx medically.  . Essential hypertension   . GERD (gastroesophageal reflux disease)   . Hyperlipidemia   . NSTEMI (non-ST elevated myocardial infarction) ([facility]) 03/11/14  . Renal cell carcinoma of right kidney (HCC)    Partial nephrectomy in 2013    Past Surgical History:  Procedure Laterality Date  . BACK SURGERY     1989  . LEFT HEART CATHETERIZATION WITH CORONARY ANGIOGRAM N/A 03/17/2014   Procedure: LEFT HEART CATHETERIZATION WITH CORONARY ANGIOGRAM;  Surgeon: Jettie Booze, MD;  Location: [facility] CATH LAB;  Service: Cardiovascular;  Laterality: N/A;  . ROBOT ASSISTED LAPAROSCOPIC NEPHRECTOMY  01/14/2012   Procedure: ROBOTIC ASSISTED LAPAROSCOPIC NEPHRECTOMY;  Surgeon: Molli Hazard, MD;  Location: [facility] ORS;  Service: Urology;  Laterality: Right;  Robot Laparoscopic Right Partial Nephrectomy    . TONSILLECTOMY      Current Medications: Outpatient Medications Prior to Visit  Medication Sig Dispense Refill  . aspirin EC 81 MG EC tablet Take 1 tablet (81 mg total) by mouth daily.    . finasteride (PROSCAR) 5 MG tablet Take 5 mg by mouth daily.  3  . nitroGLYCERIN (NITROSTAT) 0.4 MG SL tablet Place 1 tablet (0.4 mg  total) under the tongue every 5 (five) minutes x 3 doses as needed for chest pain. 25 tablet 12  . atorvastatin (LIPITOR) 80 MG tablet Take 1 tablet (80 mg total) by mouth daily. 90 tablet 3  . losartan (COZAAR) 50 MG tablet TAKE 1 TABLET BY MOUTH DAILY - MUST SCHEDULE AN APPOINTMENT FOR MORE REFILLS 15 tablet 0  . metoprolol succinate (TOPROL-XL) 25 MG 24 hr tablet TAKE 1 TABLET BY MOUTH DAILY - MUST SCHEDULE APPOINTMENT FOR MORE REFILLS 15 tablet 0   No facility-administered medications prior to  visit.     Allergies:   Alcohol   Social History   Socioeconomic History  . Marital status: Married    Spouse name: Not on file  . Number of children: Not on file  . Years of education: Not on file  . Highest education level: Not on file  Occupational History  . Not on file  Tobacco Use  . Smoking status: Former Smoker    Types: Cigarettes    Quit date: 11/18/1979    Years since quitting: 40.8  . Smokeless tobacco: Never Used  Vaping Use  . Vaping Use: Never used  Substance and Sexual Activity  . Alcohol use: No    Alcohol/week: 0.0 standard drinks  . Drug use: Yes    Types: Marijuana  . Sexual activity: Not on file  Other Topics Concern  . Not on file  Social History Narrative  . Not on file   Social Determinants of Health   Financial Resource Strain:   . Difficulty of Paying Living Expenses: Not on file  Food Insecurity:   . Worried About Charity fundraiser in the Last Year: Not on file  . Ran Out of Food in the Last Year: Not on file  Transportation Needs:   . Lack of Transportation (Medical): Not on file  . Lack of Transportation (Non-Medical): Not on file  Physical Activity:   . Days of Exercise per Week: Not on file  . Minutes of Exercise per Session: Not on file  Stress:   . Feeling of Stress : Not on file  Social Connections:   . Frequency of Communication with Friends and Family: Not on file  . Frequency of Social Gatherings with Friends and Family: Not on file  . Attends Religious Services: Not on file  . Active Member of Clubs or Organizations: Not on file  . Attends Archivist Meetings: Not on file  . Marital Status: Not on file     Family History:  The patient's family history includes Hypertension in his daughter; Renal Disease in his daughter.   Review of Systems:   Please see the history of present illness.     General:  No chills, fever, night sweats or weight changes. Positive for leg weakness.  Cardiovascular:  No chest pain,  dyspnea on exertion, edema, orthopnea, palpitations, paroxysmal nocturnal dyspnea. Dermatological: No rash, lesions/masses Respiratory: No cough, dyspnea Urologic: No hematuria, dysuria Abdominal:   No nausea, vomiting, diarrhea, bright red blood per rectum, melena, or hematemesis Neurologic:  No visual changes, wkns, changes in mental status. All other systems reviewed and are otherwise negative except as noted above.   Physical Exam:    VS:  BP (!) 162/98   Pulse 72   Ht 5\' 8"  (1.727 m)   Wt 182 lb (82.6 kg)   SpO2 98%   BMI 27.67 kg/m    General: Well developed, well nourished,male appearing in no acute distress. Head:  Normocephalic, atraumatic. Neck: No carotid bruits. JVD not elevated.  Lungs: Respirations regular and unlabored, without wheezes or rales.  Heart: Regular rate and rhythm. No S3 or S4.  No murmur, no rubs, or gallops appreciated. Abdomen: Appears non-distended. No obvious abdominal masses. Msk:  Strength and tone appear normal for age. No obvious joint deformities or effusions. Extremities: No clubbing or cyanosis. No lower extremity edema.  Distal pedal pulses are 2+ bilaterally. Neuro: Alert and oriented X 3. Moves all extremities spontaneously. No focal deficits noted. Psych:  Responds to questions appropriately with a normal affect. Skin: No rashes or lesions noted  Wt Readings from Last 3 Encounters:  09/12/20 182 lb (82.6 kg)  11/03/19 182 lb 3.2 oz (82.6 kg)  10/21/19 185 lb (83.9 kg)     Studies/Labs Reviewed:   EKG:  EKG is ordered today.  The ekg ordered today demonstrates NSR, HR 70 with TWI along the inferior leads which is similar to prior tracings.   Recent Labs: 10/17/2019: BUN 17; Creat 0.95; Potassium 5.3; Sodium 141   Lipid Panel    Component Value Date/Time   CHOL 130 09/16/2018 0856   TRIG 70 09/16/2018 0856   HDL 44 09/16/2018 0856   CHOLHDL 3.0 09/16/2018 0856   VLDL 25 05/18/2017 0913   LDLCALC 71 09/16/2018 0856     Additional studies/ records that were reviewed today include:   Cardiac Catheterization: 03/2014 IMPRESSIONS:  1. Normal left main coronary artery. 2. Mild disease in the left anterior descending artery and its branches. 3. Mild disease in the left circumflex artery and its branches. 4. Occluded right coronary artery.  PTCA of the proximal right coronary artery. This did not improve visualization of the RCA. Distal RCA fills by left to right, and right to right collaterals. 5. Overall normal left ventricular systolic function.  LVEDP 15 mmHg.  Ejection fraction 55 %. 6.   Mildly dilated aortic root.  RECOMMENDATION:  Unsuccessful angioplasty, likely due to late presentation of MI. The proximal RCA has likely been occluded for several days. We will have to treat his symptoms medically. I think he will likely have some good days and bad days in the next few weeks. We'll start Plavix. We'll restart heparin after a few hours so he can complete 48 hours of anticoagulation.  Continue ACE inhibitor, beta blocker, statin and Plavix at discharge for medical therapy of his inferior MI.  Assessment:    1. Coronary artery disease involving native coronary artery of native heart without angina pectoris   2. Essential hypertension   3. Hyperlipidemia LDL goal <70   4. Weakness of both lower extremities      Plan:   In order of problems listed above:  1. CAD - He is s/p NSTEMI in 02/2014 with cath showing occlusion of RCA filled by collaterals and unsuccessful PCI with medical management recommended.  - No recent chest pain or progressive dyspnea. Continue current medical therapy with ASA 81 mg daily, Atorvastatin 80 mg daily and Toprol-XL 25 mg daily.  2. HTN - BP was previously elevated at home and is elevated at 160/98 during today's visit. He is currently on Losartan 50 mg daily and Toprol-XL 25 mg daily. I did recommend increasing Losartan to 100 mg daily and have asked him to follow BP  at home. If he develops dizziness or fatigue, would reduce to 75mg  daily. Recheck BMET in 1-2 weeks.   3. HLD - No recent labs within the past year per his report.  LDL was at 71 in 2019. Will recheck FLP and LFT's.  Continue Atorvastatin 80 mg daily.  4. Weakness Along Lower Extremities - He reports a history of injuries to his lower extremities but has not been evaluated by an Orthopedist for likely arthritis. He does have a palpable pulse on examination which makes PAD less likely and he denies any specific pain to his lower extremities resembling claudication but mostly experiences weakness and paresthesias. Will check a Hgb A1c with upcoming labs. We also discussed a referral to Orthopedics but he wishes to hold off for now. Was encouraged to call back if wishing to pursue this.   Medication Adjustments/Labs and Tests Ordered: Current medicines are reviewed at length with the patient today.  Concerns regarding medicines are outlined above.  Medication changes, Labs and Tests ordered today are listed in the Patient Instructions below. Patient Instructions  Medication Instructions:  Your physician has recommended you make the following change in your medication:   Increase Losartan to 100 mg Daily   *If you need a refill on your cardiac medications before your next appointment, please call your pharmacy*   Lab Work: Your physician recommends that you return for lab work in: Fasting   If you have labs (blood work) drawn today and your tests are completely normal, you will receive your results only by: Marland Kitchen MyChart Message (if you have MyChart) OR . A paper copy in the mail If you have any lab test that is abnormal or we need to change your treatment, we will call you to review the results.   Testing/Procedures: NONE    Follow-Up: At [facility], you and your health needs are our priority.  As part of our continuing mission to provide you with exceptional heart care, we have created  designated Provider Care Teams.  These Care Teams include your primary Cardiologist (physician) and Advanced Practice Providers (APPs -  Physician Assistants and Nurse Practitioners) who all work together to provide you with the care you need, when you need it.  We recommend signing up for the patient portal called "MyChart".  Sign up information is provided on this After Visit Summary.  MyChart is used to connect with patients for Virtual Visits (Telemedicine).  Patients are able to view lab/test results, encounter notes, upcoming appointments, etc.  Non-urgent messages can be sent to your provider as well.   To learn more about what you can do with MyChart, go to [email or web address].    Your next appointment:   6 month(s)  The format for your next appointment:   In Person  Provider:   Rozann Lesches, MD or Bernerd Pho, PA-C   Other Instructions Thank you for choosing [facility]!     Signed, Erma Heritage, PA-C  09/12/2020 5:59 PM    [address] [address] [place], [place] [address] Phone: [phone] Fax: [phone]

## 2020-09-12 ENCOUNTER — Encounter: Payer: Self-pay | Admitting: Student

## 2020-09-12 ENCOUNTER — Other Ambulatory Visit: Payer: Self-pay

## 2020-09-12 ENCOUNTER — Ambulatory Visit (INDEPENDENT_AMBULATORY_CARE_PROVIDER_SITE_OTHER): Payer: Medicare Other | Admitting: Student

## 2020-09-12 VITALS — BP 162/98 | HR 72 | Ht 68.0 in | Wt 182.0 lb

## 2020-09-12 DIAGNOSIS — I10 Essential (primary) hypertension: Secondary | ICD-10-CM

## 2020-09-12 DIAGNOSIS — E785 Hyperlipidemia, unspecified: Secondary | ICD-10-CM | POA: Diagnosis not present

## 2020-09-12 DIAGNOSIS — I2510 Atherosclerotic heart disease of native coronary artery without angina pectoris: Secondary | ICD-10-CM | POA: Diagnosis not present

## 2020-09-12 DIAGNOSIS — R29898 Other symptoms and signs involving the musculoskeletal system: Secondary | ICD-10-CM | POA: Diagnosis not present

## 2020-09-12 MED ORDER — ATORVASTATIN CALCIUM 80 MG PO TABS
80.0000 mg | ORAL_TABLET | Freq: Every day | ORAL | 3 refills | Status: DC
Start: 2020-09-12 — End: 2021-09-12

## 2020-09-12 MED ORDER — LOSARTAN POTASSIUM 100 MG PO TABS: 100.0000 mg | ORAL_TABLET | Freq: Every day | ORAL | 3 refills | Status: DC

## 2020-09-12 MED ORDER — METOPROLOL SUCCINATE ER 25 MG PO TB24: ORAL_TABLET | ORAL | 3 refills | Status: DC

## 2020-09-12 NOTE — Patient Instructions (Signed)
Medication Instructions:  Your physician has recommended you make the following change in your medication:   Increase Losartan to 100 mg Daily   *If you need a refill on your cardiac medications before your next appointment, please call your pharmacy*   Lab Work: Your physician recommends that you return for lab work in: Fasting   If you have labs (blood work) drawn today and your tests are completely normal, you will receive your results only by: Marland Kitchen MyChart Message (if you have MyChart) OR . A paper copy in the mail If you have any lab test that is abnormal or we need to change your treatment, we will call you to review the results.   Testing/Procedures: NONE    Follow-Up: At [facility], you and your health needs are our priority.  As part of our continuing mission to provide you with exceptional heart care, we have created designated Provider Care Teams.  These Care Teams include your primary Cardiologist (physician) and Advanced Practice Providers (APPs -  Physician Assistants and Nurse Practitioners) who all work together to provide you with the care you need, when you need it.  We recommend signing up for the patient portal called "MyChart".  Sign up information is provided on this After Visit Summary.  MyChart is used to connect with patients for Virtual Visits (Telemedicine).  Patients are able to view lab/test results, encounter notes, upcoming appointments, etc.  Non-urgent messages can be sent to your provider as well.   To learn more about what you can do with MyChart, go to [email or web address].    Your next appointment:   6 month(s)  The format for your next appointment:   In Person  Provider:   Rozann Lesches, MD or Bernerd Pho, PA-C   Other Instructions Thank you for choosing [facility]!

## 2020-09-21 LAB — CBC
HCT: 44.8 % (ref 38.5–50.0)
Hemoglobin: 14.9 g/dL (ref 13.2–17.1)
MCH: 29.9 pg (ref 27.0–33.0)
MCHC: 33.3 g/dL (ref 32.0–36.0)
MCV: 89.8 fL (ref 80.0–100.0)
MPV: 10.2 fL (ref 7.5–12.5)
Platelets: 238 10*3/uL (ref 140–400)
RBC: 4.99 10*6/uL (ref 4.20–5.80)
RDW: 13.1 % (ref 11.0–15.0)
WBC: 7.4 10*3/uL (ref 3.8–10.8)

## 2020-09-21 LAB — HEMOGLOBIN A1C
Hgb A1c MFr Bld: 5.8 % of total Hgb — ABNORMAL HIGH (ref <5.7)
Mean Plasma Glucose: 120 (calc)
eAG (mmol/L): 6.6 (calc)

## 2020-09-21 LAB — LIPID PANEL
Cholesterol: 119 mg/dL (ref <200)
HDL: 43 mg/dL (ref >=40)
LDL Cholesterol (Calc): 59 mg/dL (calc)
Non-HDL Cholesterol (Calc): 76 mg/dL (calc) (ref <130)
Total CHOL/HDL Ratio: 2.8 (calc) (ref <5.0)
Triglycerides: 90 mg/dL (ref <150)

## 2020-09-21 LAB — COMPREHENSIVE METABOLIC PANEL
AG Ratio: 1.9 (calc) (ref 1.0–2.5)
ALT: 18 U/L (ref 9–46)
AST: 17 U/L (ref 10–35)
Albumin: 4.2 g/dL (ref 3.6–5.1)
Alkaline phosphatase (APISO): 67 U/L (ref 35–144)
BUN: 18 mg/dL (ref 7–25)
CO2: 25 mmol/L (ref 20–32)
Calcium: 9.4 mg/dL (ref 8.6–10.3)
Chloride: 106 mmol/L (ref 98–110)
Creat: 0.94 mg/dL (ref 0.70–1.25)
Globulin: 2.2 g/dL (calc) (ref 1.9–3.7)
Glucose, Bld: 109 mg/dL — ABNORMAL HIGH (ref 65–99)
Potassium: 4.8 mmol/L (ref 3.5–5.3)
Sodium: 140 mmol/L (ref 135–146)
Total Bilirubin: 0.6 mg/dL (ref 0.2–1.2)
Total Protein: 6.4 g/dL (ref 6.1–8.1)

## 2020-11-29 ENCOUNTER — Encounter: Payer: Self-pay | Admitting: General Surgery

## 2020-11-29 ENCOUNTER — Ambulatory Visit (INDEPENDENT_AMBULATORY_CARE_PROVIDER_SITE_OTHER): Payer: Medicare Other | Admitting: General Surgery

## 2020-11-29 ENCOUNTER — Other Ambulatory Visit: Payer: Self-pay

## 2020-11-29 VITALS — BP 159/104 | HR 70 | Temp 97.9°F | Resp 14 | Ht 68.0 in | Wt 185.0 lb

## 2020-11-29 DIAGNOSIS — Z1211 Encounter for screening for malignant neoplasm of colon: Secondary | ICD-10-CM | POA: Diagnosis not present

## 2020-11-29 MED ORDER — GOLYTELY 236 G PO SOLR: 4000.0000 mL | Freq: Once | ORAL | 0 refills | Status: AC

## 2020-11-29 NOTE — Patient Instructions (Signed)
Colonoscopy, Adult A colonoscopy is a procedure to look at the entire large intestine. This procedure is done using a long, thin, flexible tube that has a camera on the end. You may have a colonoscopy:  As a part of normal colorectal screening.  If you have certain symptoms, such as: ? A low number of red blood cells in your blood (anemia). ? Diarrhea that does not go away. ? Pain in your abdomen. ? Blood in your stool. A colonoscopy can help screen for and diagnose medical problems, including:  Tumors.  Extra tissue that grows where mucus forms (polyps).  Inflammation.  Areas of bleeding. Tell your health care provider about:  Any allergies you have.  All medicines you are taking, including vitamins, herbs, eye drops, creams, and over-the-counter medicines.  Any problems you or family members have had with anesthetic medicines.  Any blood disorders you have.  Any surgeries you have had.  Any medical conditions you have.  Any problems you have had with having bowel movements.  Whether you are pregnant or may be pregnant. What are the risks? Generally, this is a safe procedure. However, problems may occur, including:  Bleeding.  Damage to your intestine.  Allergic reactions to medicines given during the procedure.  Infection. This is rare. What happens before the procedure? Eating and drinking restrictions Follow instructions from your health care provider about eating or drinking restrictions, which may include:  A few days before the procedure: ? Follow a low-fiber diet. ? Avoid nuts, seeds, dried fruit, raw fruits, and vegetables.  1-3 days before the procedure: ? Eat only gelatin dessert or ice pops. ? Drink only clear liquids, such as water, clear juice, clear broth or bouillon, black coffee or tea, or clear soft drinks or sports drinks. ? Avoid liquids that contain red or purple dye. Bowel prep If you were prescribed a bowel prep to take by mouth  (orally) to clean out your colon:  Take it as told by your health care provider. Starting the day before your procedure, you will need to drink a large amount of liquid medicine. The liquid will cause you to have many bowel movements of loose stool until your stool becomes almost clear or light green.  If your skin or the opening between the buttocks (anus) gets irritated from diarrhea, you may relieve the irritation using: ? Wipes with medicine in them, such as adult wet wipes with aloe and vitamin E. ? A product to soothe skin, such as petroleum jelly.  If you vomit while drinking the bowel prep: ? Take a break for up to 60 minutes. ? Begin the bowel prep again. ? Call your health care provider if you keep vomiting or you cannot take the bowel prep without vomiting.  To clean out your colon, you may also be given: ? Laxative medicines. These help you have a bowel movement. ? Instructions for enema use. An enema is liquid medicine injected into your rectum. Medicines Ask your health care provider about:  Changing or stopping your regular medicines or supplements. This is especially important if you are taking iron supplements, diabetes medicines, or blood thinners.  Taking medicines such as aspirin and ibuprofen. These medicines can thin your blood. Do not take these medicines unless your health care provider tells you to take them.  Taking over-the-counter medicines, vitamins, herbs, and supplements. General instructions  Ask your health care provider what steps will be taken to help prevent infection. These may include washing skin with a   germ-killing soap.  Plan to have someone take you home from the hospital or clinic. What happens during the procedure?  An IV will be inserted into one of your veins.  You may be given one or more of the following: ? A medicine to help you relax (sedative). ? A medicine to numb the area (local anesthetic). ? A medicine to make you fall asleep  (general anesthetic). This is rarely needed.  You will lie on your side with your knees bent.  The tube will: ? Have oil or gel put on it (be lubricated). ? Be inserted into your anus. ? Be gently eased through all parts of your large intestine.  Air will be sent into your colon to keep it open. This may cause some pressure or cramping.  Images will be taken with the camera and will appear on a screen.  A small tissue sample may be removed to be looked at under a microscope (biopsy). The tissue may be sent to a lab for testing if any signs of problems are found.  If small polyps are found, they may be removed and checked for cancer cells.  When the procedure is finished, the tube will be removed. The procedure may vary among health care providers and hospitals.   What happens after the procedure?  Your blood pressure, heart rate, breathing rate, and blood oxygen level will be monitored until you leave the hospital or clinic.  You may have a small amount of blood in your stool.  You may pass gas and have mild cramping or bloating in your abdomen. This is caused by the air that was used to open your colon during the exam.  Do not drive for 24 hours after the procedure.  It is up to you to get the results of your procedure. Ask your health care provider, or the department that is doing the procedure, when your results will be ready. Summary  A colonoscopy is a procedure to look at the entire large intestine.  Follow instructions from your health care provider about eating and drinking before the procedure.  If you were prescribed an oral bowel prep to clean out your colon, take it as told by your health care provider.  During the colonoscopy, a flexible tube with a camera on its end is inserted into the anus and then passed into the other parts of the large intestine. This information is not intended to replace advice given to you by your health care provider. Make sure you  discuss any questions you have with your health care provider. Document Revised: 05/27/2019 Document Reviewed: 05/27/2019 Elsevier Patient Education  2021 Elsevier Inc.  

## 2020-11-30 NOTE — Progress Notes (Signed)
[name]; [id number]; January 11, 1956   HPI Patient is a 65 year old white male who was referred to my care by Dr. Nadara Mustard for a screening colonoscopy.  Patient has never had a colonoscopy.  He denies any family history of colon cancer.  He denies any significant abdominal pain, weight loss, abnormal diarrhea or constipation.  Occasionally he notices blood specks in his stool. Past Medical History:  Diagnosis Date  . Arthritis   . Coronary atherosclerosis of native coronary artery    a. NSTEMI (02/2014):  LHC (02/2014):  Mild disease in LAD and CFX; prox RCA occluded with R-R collats, dist RCA filled by L-R collats, inf HK, EF 55%, LVEDP 15 mmHg.  PCI:  Unsuccessful angioplasty of RCA (late presentation of inf MI) - tx medically.  . Essential hypertension   . GERD (gastroesophageal reflux disease)   . Hyperlipidemia   . NSTEMI (non-ST elevated myocardial infarction) ([facility]) 03/11/14  . Renal cell carcinoma of right kidney (HCC)    Partial nephrectomy in 2013    Past Surgical History:  Procedure Laterality Date  . BACK SURGERY     1989  . LEFT HEART CATHETERIZATION WITH CORONARY ANGIOGRAM N/A 03/17/2014   Procedure: LEFT HEART CATHETERIZATION WITH CORONARY ANGIOGRAM;  Surgeon: Jettie Booze, MD;  Location: [facility] CATH LAB;  Service: Cardiovascular;  Laterality: N/A;  . ROBOT ASSISTED LAPAROSCOPIC NEPHRECTOMY  01/14/2012   Procedure: ROBOTIC ASSISTED LAPAROSCOPIC NEPHRECTOMY;  Surgeon: Molli Hazard, MD;  Location: [facility] ORS;  Service: Urology;  Laterality: Right;  Robot Laparoscopic Right Partial Nephrectomy    . TONSILLECTOMY      Family History  Problem Relation Age of Onset  . Renal Disease Daughter   . Hypertension Daughter     Current Outpatient Medications on File Prior to Visit  Medication Sig Dispense Refill  . aspirin EC 81 MG EC tablet Take 1 tablet (81 mg total) by mouth daily.    Marland Kitchen atorvastatin (LIPITOR) 80 MG tablet Take 1 tablet (80 mg total) by mouth daily. 90  tablet 3  . finasteride (PROSCAR) 5 MG tablet Take 5 mg by mouth daily.  3  . losartan (COZAAR) 100 MG tablet Take 1 tablet (100 mg total) by mouth daily. 90 tablet 3  . metoprolol succinate (TOPROL-XL) 25 MG 24 hr tablet TAKE 1 TABLET BY MOUTH DAILY 90 tablet 3  . nitroGLYCERIN (NITROSTAT) 0.4 MG SL tablet Place 1 tablet (0.4 mg total) under the tongue every 5 (five) minutes x 3 doses as needed for chest pain. 25 tablet 12   No current facility-administered medications on file prior to visit.    Allergies  Allergen Reactions  . Alcohol Other (See Comments)    Pts skin gets red. Ex. Pt used alcohol based deodorant and got red things under arms. & Drinks alcohol notices "from heart on up" everything turns red.  . Chlorthalidone     Dizziness and Syncopal Episode    Social History   Substance and Sexual Activity  Alcohol Use No  . Alcohol/week: 0.0 standard drinks    Social History   Tobacco Use  Smoking Status Former Smoker  . Types: Cigarettes  . Quit date: 11/18/1979  . Years since quitting: 41.0  Smokeless Tobacco Never Used    Review of Systems  Constitutional: Positive for malaise/fatigue.  HENT: Negative.   Eyes: Negative.   Respiratory: Negative.   Cardiovascular: Negative.   Gastrointestinal: Negative.   Genitourinary: Positive for frequency.  Musculoskeletal: Negative.   Skin: Negative.  Neurological: Positive for sensory change.  Endo/Heme/Allergies: Negative.   Psychiatric/Behavioral: Negative.     Objective   Vitals:   11/29/20 1130  BP: (!) 159/104  Pulse: 70  Resp: 14  Temp: 97.9 F (36.6 C)  SpO2: 98%    Physical Exam Vitals reviewed.  Constitutional:      Appearance: Normal appearance. He is not ill-appearing.  HENT:     Head: Normocephalic and atraumatic.  Cardiovascular:     Rate and Rhythm: Normal rate and regular rhythm.     Heart sounds: Normal heart sounds. No murmur heard. No friction rub. No gallop.   Pulmonary:     Effort:  Pulmonary effort is normal. No respiratory distress.     Breath sounds: Normal breath sounds. No stridor. No wheezing, rhonchi or rales.  Abdominal:     General: Bowel sounds are normal. There is no distension.     Palpations: Abdomen is soft. There is no mass.     Tenderness: There is no abdominal tenderness. There is no guarding or rebound.     Hernia: No hernia is present.  Skin:    General: Skin is warm and dry.  Neurological:     Mental Status: He is alert and oriented to person, place, and time.    Primary care notes reviewed Assessment  Need for screening colonoscopy Plan   Patient is scheduled for a screening colonoscopy on 12/11/2020.  The risks and benefits of the procedure including bleeding and perforation were fully explained to the patient, who gave informed consent.  GoLytely prep has been ordered.

## 2020-11-30 NOTE — H&P (Signed)
[name]; [id number]; January 11, 1956   HPI Patient is a 65 year old white male who was referred to my care by Dr. Nadara Mustard for a screening colonoscopy.  Patient has never had a colonoscopy.  He denies any family history of colon cancer.  He denies any significant abdominal pain, weight loss, abnormal diarrhea or constipation.  Occasionally he notices blood specks in his stool. Past Medical History:  Diagnosis Date  . Arthritis   . Coronary atherosclerosis of native coronary artery    a. NSTEMI (02/2014):  LHC (02/2014):  Mild disease in LAD and CFX; prox RCA occluded with R-R collats, dist RCA filled by L-R collats, inf HK, EF 55%, LVEDP 15 mmHg.  PCI:  Unsuccessful angioplasty of RCA (late presentation of inf MI) - tx medically.  . Essential hypertension   . GERD (gastroesophageal reflux disease)   . Hyperlipidemia   . NSTEMI (non-ST elevated myocardial infarction) ([facility]) 03/11/14  . Renal cell carcinoma of right kidney (HCC)    Partial nephrectomy in 2013    Past Surgical History:  Procedure Laterality Date  . BACK SURGERY     1989  . LEFT HEART CATHETERIZATION WITH CORONARY ANGIOGRAM N/A 03/17/2014   Procedure: LEFT HEART CATHETERIZATION WITH CORONARY ANGIOGRAM;  Surgeon: Jettie Booze, MD;  Location: [facility] CATH LAB;  Service: Cardiovascular;  Laterality: N/A;  . ROBOT ASSISTED LAPAROSCOPIC NEPHRECTOMY  01/14/2012   Procedure: ROBOTIC ASSISTED LAPAROSCOPIC NEPHRECTOMY;  Surgeon: Molli Hazard, MD;  Location: [facility];  Service: Urology;  Laterality: Right;  Robot Laparoscopic Right Partial Nephrectomy    . TONSILLECTOMY      Family History  Problem Relation Age of Onset  . Renal Disease Daughter   . Hypertension Daughter     Current Outpatient Medications on File Prior to Visit  Medication Sig Dispense Refill  . aspirin EC 81 MG EC tablet Take 1 tablet (81 mg total) by mouth daily.    Marland Kitchen atorvastatin (LIPITOR) 80 MG tablet Take 1 tablet (80 mg total) by mouth daily. 90  tablet 3  . finasteride (PROSCAR) 5 MG tablet Take 5 mg by mouth daily.  3  . losartan (COZAAR) 100 MG tablet Take 1 tablet (100 mg total) by mouth daily. 90 tablet 3  . metoprolol succinate (TOPROL-XL) 25 MG 24 hr tablet TAKE 1 TABLET BY MOUTH DAILY 90 tablet 3  . nitroGLYCERIN (NITROSTAT) 0.4 MG SL tablet Place 1 tablet (0.4 mg total) under the tongue every 5 (five) minutes x 3 doses as needed for chest pain. 25 tablet 12   No current facility-administered medications on file prior to visit.    Allergies  Allergen Reactions  . Alcohol Other (See Comments)    Pts skin gets red. Ex. Pt used alcohol based deodorant and got red things under arms. & Drinks alcohol notices "from heart on up" everything turns red.  . Chlorthalidone     Dizziness and Syncopal Episode    Social History   Substance and Sexual Activity  Alcohol Use No  . Alcohol/week: 0.0 standard drinks    Social History   Tobacco Use  Smoking Status Former Smoker  . Types: Cigarettes  . Quit date: 11/18/1979  . Years since quitting: 41.0  Smokeless Tobacco Never Used    Review of Systems  Constitutional: Positive for malaise/fatigue.  HENT: Negative.   Eyes: Negative.   Respiratory: Negative.   Cardiovascular: Negative.   Gastrointestinal: Negative.   Genitourinary: Positive for frequency.  Musculoskeletal: Negative.   Skin: Negative.  Neurological: Positive for sensory change.  Endo/Heme/Allergies: Negative.   Psychiatric/Behavioral: Negative.     Objective   Vitals:   11/29/20 1130  BP: (!) 159/104  Pulse: 70  Resp: 14  Temp: 97.9 F (36.6 C)  SpO2: 98%    Physical Exam Vitals reviewed.  Constitutional:      Appearance: Normal appearance. He is not ill-appearing.  HENT:     Head: Normocephalic and atraumatic.  Cardiovascular:     Rate and Rhythm: Normal rate and regular rhythm.     Heart sounds: Normal heart sounds. No murmur heard. No friction rub. No gallop.   Pulmonary:     Effort:  Pulmonary effort is normal. No respiratory distress.     Breath sounds: Normal breath sounds. No stridor. No wheezing, rhonchi or rales.  Abdominal:     General: Bowel sounds are normal. There is no distension.     Palpations: Abdomen is soft. There is no mass.     Tenderness: There is no abdominal tenderness. There is no guarding or rebound.     Hernia: No hernia is present.  Skin:    General: Skin is warm and dry.  Neurological:     Mental Status: He is alert and oriented to person, place, and time.    Primary care notes reviewed Assessment  Need for screening colonoscopy Plan   Patient is scheduled for a screening colonoscopy on 12/11/2020.  The risks and benefits of the procedure including bleeding and perforation were fully explained to the patient, who gave informed consent.  GoLytely prep has been ordered. 

## 2020-12-07 ENCOUNTER — Other Ambulatory Visit: Payer: Self-pay

## 2020-12-07 ENCOUNTER — Other Ambulatory Visit (HOSPITAL_COMMUNITY)
Admission: RE | Admit: 2020-12-07 | Discharge: 2020-12-07 | Disposition: A | Discharge status: Home / Self Care | Payer: Medicare Other | Source: Ambulatory Visit | Attending: General Surgery | Admitting: General Surgery

## 2020-12-07 DIAGNOSIS — Z20822 Contact with and (suspected) exposure to covid-19: Secondary | ICD-10-CM | POA: Diagnosis not present

## 2020-12-07 DIAGNOSIS — Z01812 Encounter for preprocedural laboratory examination: Secondary | ICD-10-CM | POA: Insufficient documentation

## 2020-12-08 LAB — SARS CORONAVIRUS 2 (TAT 6-24 HRS): SARS Coronavirus 2: NEGATIVE

## 2020-12-11 ENCOUNTER — Encounter (HOSPITAL_COMMUNITY)
Admission: RE | Disposition: A | Discharge status: Home / Self Care | Payer: Self-pay | Source: Home / Self Care | Attending: General Surgery

## 2020-12-11 ENCOUNTER — Ambulatory Visit (HOSPITAL_COMMUNITY)
Admission: RE | Admit: 2020-12-11 | Discharge: 2020-12-11 | Disposition: A | Discharge status: Home / Self Care | Payer: Medicare Other | Attending: General Surgery | Admitting: General Surgery

## 2020-12-11 ENCOUNTER — Other Ambulatory Visit: Payer: Self-pay

## 2020-12-11 ENCOUNTER — Encounter (HOSPITAL_COMMUNITY): Payer: Self-pay | Admitting: General Surgery

## 2020-12-11 DIAGNOSIS — D123 Benign neoplasm of transverse colon: Secondary | ICD-10-CM | POA: Diagnosis not present

## 2020-12-11 DIAGNOSIS — Z7982 Long term (current) use of aspirin: Secondary | ICD-10-CM | POA: Diagnosis not present

## 2020-12-11 DIAGNOSIS — Z888 Allergy status to other drugs, medicaments and biological substances status: Secondary | ICD-10-CM | POA: Diagnosis not present

## 2020-12-11 DIAGNOSIS — Z91048 Other nonmedicinal substance allergy status: Secondary | ICD-10-CM | POA: Diagnosis not present

## 2020-12-11 DIAGNOSIS — Z1211 Encounter for screening for malignant neoplasm of colon: Secondary | ICD-10-CM | POA: Diagnosis not present

## 2020-12-11 DIAGNOSIS — Z79899 Other long term (current) drug therapy: Secondary | ICD-10-CM | POA: Diagnosis not present

## 2020-12-11 DIAGNOSIS — K5730 Diverticulosis of large intestine without perforation or abscess without bleeding: Secondary | ICD-10-CM | POA: Diagnosis not present

## 2020-12-11 DIAGNOSIS — Z87891 Personal history of nicotine dependence: Secondary | ICD-10-CM | POA: Insufficient documentation

## 2020-12-11 HISTORY — PX: POLYPECTOMY: SHX5525

## 2020-12-11 HISTORY — PX: COLONOSCOPY: SHX5424

## 2020-12-11 SURGERY — COLONOSCOPY
Anesthesia: Moderate Sedation

## 2020-12-11 MED ORDER — MEPERIDINE HCL 50 MG/ML IJ SOLN
INTRAMUSCULAR | Status: AC
  Filled 2020-12-11: qty 1

## 2020-12-11 MED ORDER — MIDAZOLAM HCL 5 MG/5ML IJ SOLN
INTRAMUSCULAR | Status: DC | PRN
  Administered 2020-12-11: 2 mg via INTRAVENOUS
  Administered 2020-12-11: 1 mg via INTRAVENOUS

## 2020-12-11 MED ORDER — MEPERIDINE HCL 50 MG/ML IJ SOLN
INTRAMUSCULAR | Status: DC | PRN
  Administered 2020-12-11: 50 mg

## 2020-12-11 MED ORDER — SODIUM CHLORIDE 0.9 % IV SOLN
INTRAVENOUS | Status: DC
  Administered 2020-12-11: 1000 mL via INTRAVENOUS

## 2020-12-11 MED ORDER — MIDAZOLAM HCL 5 MG/5ML IJ SOLN
INTRAMUSCULAR | Status: AC
  Filled 2020-12-11: qty 5

## 2020-12-11 NOTE — Interval H&P Note (Signed)
History and Physical Interval Note:  12/11/2020 7:20 AM  [name]  has presented today for surgery, with the diagnosis of Screening.  The various methods of treatment have been discussed with the patient and family. After consideration of risks, benefits and other options for treatment, the patient has consented to  Procedure(s): COLONOSCOPY (N/A) as a surgical intervention.  The patient's history has been reviewed, patient examined, no change in status, stable for surgery.  I have reviewed the patient's chart and labs.  Questions were answered to the patient's satisfaction.     Aviva Signs

## 2020-12-11 NOTE — Discharge Instructions (Signed)
Colonoscopy, Adult, Care After  This sheet gives you information about how to care for yourself after your procedure. Your health care provider may also give you more specific instructions. If you have problems or questions, contact your health care provider.  What can I expect after the procedure?  After the procedure, it is common to have:  · A small amount of blood in your stool for 24 hours after the procedure.  · Some gas.  · Mild cramping or bloating of your abdomen.  Follow these instructions at home:  Eating and drinking    · Drink enough fluid to keep your urine pale yellow.  · Follow instructions from your health care provider about eating or drinking restrictions.  · Resume your normal diet as instructed by your health care provider. Avoid heavy or fried foods that are hard to digest.  Activity  · Rest as told by your health care provider.  · Avoid sitting for a long time without moving. Get up to take short walks every 1-2 hours. This is important to improve blood flow and breathing. Ask for help if you feel weak or unsteady.  · Return to your normal activities as told by your health care provider. Ask your health care provider what activities are safe for you.  Managing cramping and bloating    · Try walking around when you have cramps or feel bloated.  · Apply heat to your abdomen as told by your health care provider. Use the heat source that your health care provider recommends, such as a moist heat pack or a heating pad.  ? Place a towel between your skin and the heat source.  ? Leave the heat on for 20-30 minutes.  ? Remove the heat if your skin turns bright red. This is especially important if you are unable to feel pain, heat, or cold. You may have a greater risk of getting burned.  General instructions  · If you were given a sedative during the procedure, it can affect you for several hours. Do not drive or operate machinery until your health care provider says that it is safe.  · For the first  24 hours after the procedure:  ? Do not sign important documents.  ? Do not drink alcohol.  ? Do your regular daily activities at a slower pace than normal.  ? Eat soft foods that are easy to digest.  · Take over-the-counter and prescription medicines only as told by your health care provider.  · Keep all follow-up visits as told by your health care provider. This is important.  Contact a health care provider if:  · You have blood in your stool 2-3 days after the procedure.  Get help right away if you have:  · More than a small spotting of blood in your stool.  · Large blood clots in your stool.  · Swelling of your abdomen.  · Nausea or vomiting.  · A fever.  · Increasing pain in your abdomen that is not relieved with medicine.  Summary  · After the procedure, it is common to have a small amount of blood in your stool. You may also have mild cramping and bloating of your abdomen.  · If you were given a sedative during the procedure, it can affect you for several hours. Do not drive or operate machinery until your health care provider says that it is safe.  · Get help right away if you have a lot of blood in your   stool, nausea or vomiting, a fever, or increased pain in your abdomen.  This information is not intended to replace advice given to you by your health care provider. Make sure you discuss any questions you have with your health care provider.  Document Revised: 10/28/2019 Document Reviewed: 05/30/2019  Elsevier Patient Education © 2021 [employer]

## 2020-12-11 NOTE — Op Note (Signed)
[name] Patient Name: [name] Procedure Date: 12/11/2020 7:14 AM MRN: [id number] Date of Birth: [birth year] Attending MD: Aviva Signs , MD CSN: [id number] Age: 65 Admit Type: Outpatient Procedure:                Colonoscopy Indications:              Screening for colorectal malignant neoplasm Providers:                Aviva Signs, MD, Lambert Mody, Crystal Page,                            Nelma Rothman, Technician Referring MD:              Medicines:                Midazolam 3 mg IV, Meperidine 50 mg IV Complications:            No immediate complications. Estimated Blood Loss:     Estimated blood loss: none. Procedure:                Pre-Anesthesia Assessment:                           - Prior to the procedure, a History and Physical                            was performed, and patient medications and                            allergies were reviewed. The patient is competent.                            The risks and benefits of the procedure and the                            sedation options and risks were discussed with the                            patient. All questions were answered and informed                            consent was obtained. Patient identification and                            proposed procedure were verified by the physician,                            the nurse and the technician in the endoscopy                            suite. Mental Status Examination: alert and                            oriented. Airway Examination: normal oropharyngeal  airway and neck mobility. Respiratory Examination:                            clear to auscultation. CV Examination: RRR, no                            murmurs, no S3 or S4. Prophylactic Antibiotics: The                            patient does not require prophylactic antibiotics.                            Prior Anticoagulants: The patient has taken no                             previous anticoagulant or antiplatelet agents                            except for aspirin. ASA Grade Assessment: II - A                            patient with mild systemic disease. After reviewing                            the risks and benefits, the patient was deemed in                            satisfactory condition to undergo the procedure.                            The anesthesia plan was to use moderate sedation /                            analgesia (conscious sedation). Immediately prior                            to administration of medications, the patient was                            re-assessed for adequacy to receive sedatives. The                            heart rate, respiratory rate, oxygen saturations,                            blood pressure, adequacy of pulmonary ventilation,                            and response to care were monitored throughout the                            procedure. The physical status of the patient was  re-assessed after the procedure.                           After obtaining informed consent, the colonoscope                            was passed under direct vision. Throughout the                            procedure, the patient's blood pressure, pulse, and                            oxygen saturations were monitored continuously. The                            CF-HQ190L ([id number]) scope was introduced through                            the anus and advanced to the the cecum, identified                            by the appendiceal orifice, ileocecal valve and                            palpation. No anatomical landmarks were                            photographed. The entire colon was well visualized.                            The colonoscopy was performed without difficulty.                            The quality of the bowel preparation was adequate.                            The total  duration of the procedure was 20 minutes. Scope In: 7:35:09 AM Scope Out: 7:52:21 AM Scope Withdrawal Time: 0 hours 14 minutes 13 seconds  Total Procedure Duration: 0 hours 17 minutes 12 seconds  Findings:      The perianal and digital rectal examinations were normal.      A few small-mouthed diverticula were found in the sigmoid colon.       Estimated blood loss: none.      The entire examined colon appeared normal.      A 3 mm polyp was found in the proximal transverse colon. The polyp was       pedunculated. The polyp was removed with a hot snare. Resection and       retrieval were complete. Estimated blood loss: none.      A 3 mm polyp was found in the distal transverse colon. The polyp was       pedunculated. The polyp was removed with a hot snare. Resection and       retrieval were complete. Estimated blood loss: none.      A 5 mm polyp was found in the  mid sigmoid colon. The polyp was       pedunculated. The polyp was removed with a hot snare. Resection and       retrieval were complete. Estimated blood loss: none.      The exam was otherwise without abnormality. Impression:               - Diverticulosis in the sigmoid colon.                           - One 3 mm polyp in the proximal transverse colon,                            removed with a hot snare. Resected and retrieved.                           - One 3 mm polyp in the distal transverse colon,                            removed with a hot snare. Resected and retrieved.                           - One 5 mm polyp in the mid sigmoid colon, removed                            with a hot snare. Resected and retrieved.                           - The examination was otherwise normal. Moderate Sedation:      Moderate (conscious) sedation was administered by the endoscopy nurse       and supervised by the endoscopist. The patient's oxygen saturation,       heart rate, blood pressure and response to care were  monitored. Recommendation:           - Patient has a contact number available for                            emergencies. The signs and symptoms of potential                            delayed complications were discussed with the                            patient. Return to normal activities tomorrow.                            Written discharge instructions were provided to the                            patient.                           - Written discharge instructions were provided to  the patient.                           - The signs and symptoms of potential delayed                            complications were discussed with the patient.                           - Patient has a contact number available for                            emergencies.                           - Return to normal activities tomorrow.                           - Resume previous diet.                           - Continue present medications.                           - Await pathology results.                           - Repeat colonoscopy is recommended. The                            colonoscopy date will be determined after pathology                            results from today's exam become available for                            review. Procedure Code(s):        --- Professional ---                           [phone], Colonoscopy, flexible; with removal of                            tumor(s), polyp(s), or other lesion(s) by snare                            technique Diagnosis Code(s):        --- Professional ---                           Z12.11, Encounter for screening for malignant                            neoplasm of colon                           K63.5, Polyp of colon  K57.30, Diverticulosis of large intestine without                            perforation or abscess without bleeding CPT copyright 2019 American Medical Association. All  rights reserved. The codes documented in this report are preliminary and upon coder review may  be revised to meet current compliance requirements. Aviva Signs, MD Aviva Signs, MD 12/11/2020 8:03:12 AM This report has been signed electronically. Number of Addenda: 0

## 2020-12-13 ENCOUNTER — Encounter (HOSPITAL_COMMUNITY): Payer: Self-pay | Admitting: General Surgery

## 2020-12-13 LAB — SURGICAL PATHOLOGY

## 2020-12-14 ENCOUNTER — Telehealth (INDEPENDENT_AMBULATORY_CARE_PROVIDER_SITE_OTHER): Payer: Medicare Other | Admitting: General Surgery

## 2020-12-14 DIAGNOSIS — Z09 Encounter for follow-up examination after completed treatment for conditions other than malignant neoplasm: Secondary | ICD-10-CM

## 2020-12-14 NOTE — Telephone Encounter (Signed)
Patient notified of results from his colonoscopy.  Multiple tubular adenomas were found.  No high-grade dysplasia or malignancy was found.  He was instructed to follow-up with me in 3 years for a follow-up colonoscopy.  All questions were answered.

## 2021-02-07 ENCOUNTER — Encounter: Payer: Self-pay | Admitting: Neurology

## 2021-02-07 ENCOUNTER — Ambulatory Visit (INDEPENDENT_AMBULATORY_CARE_PROVIDER_SITE_OTHER): Payer: Medicare Other | Admitting: Neurology

## 2021-02-07 VITALS — BP 138/92 | HR 64 | Ht 68.0 in | Wt 178.0 lb

## 2021-02-07 DIAGNOSIS — R269 Unspecified abnormalities of gait and mobility: Secondary | ICD-10-CM

## 2021-02-07 DIAGNOSIS — R292 Abnormal reflex: Secondary | ICD-10-CM | POA: Insufficient documentation

## 2021-02-07 DIAGNOSIS — R208 Other disturbances of skin sensation: Secondary | ICD-10-CM

## 2021-02-07 DIAGNOSIS — R29898 Other symptoms and signs involving the musculoskeletal system: Secondary | ICD-10-CM | POA: Diagnosis not present

## 2021-02-07 DIAGNOSIS — R9082 White matter disease, unspecified: Secondary | ICD-10-CM | POA: Diagnosis not present

## 2021-02-07 NOTE — Progress Notes (Signed)
[facility]  PATIENT: [name] DOB: 1956-06-29  REFERRING DOCTOR OR PCP:  Lanelle Bal, PA-C SOURCE: Patient, notes from primary care, imaging reports, MRI images (brain and lumbar spine) personally reviewed.  _________________________________   HISTORICAL  CHIEF COMPLAINT:  Chief Complaint  Patient presents with  . New Patient (Initial Visit)    RM 75 with wife. Paper referral from Lanelle Bal, [place] at [place] for MS. Had MRI brain/lumbar 01/29/21. MRI CD from [facility] for review. He has no family hx of MS. Reports the following sx: fatigue, cramping, numbness in toes/fingers, left arm tinglingx2yr. Has hx back surgery. Had cervical surgery in 1990. Just lost daughter 2 weeks ago.     HISTORY OF PRESENT ILLNESS:  I had the pleasure seeing your patient, [name], at [facility] for neurologic consultation regarding the possibility of multiple sclerosis.  He is a 65 year old man with a history of lumbar DJD and surgery who began to experience leg numbness and right leg weakness last summer.   Specifically noted reduced ability to lift the right leg.  This affected his gait.  When sitting down, he has trouble raising the right foot towards the left knee.  He notes altered sensation in the legs.  Additionally, he has chronic tingling/numbness in his right hand bit more recently has had tingling in the hand.  He had surgery in 1988.    He also has known cervical DJD.  He has had right hand numbness since the late 1990's and MRi at the time showed disc pathology but he opted not to do surgery.   He throws his lower back about once or twice a year but is better a couple days to a week later.     Currently, he has no leg pain but has difficulty raising the right leg up   He also has tingling and allodynia (touch is mildly tender) in the outer left leg in the thigh lower leg into the foot.     He has had urinary urgency, worse the  last year.   He also notes some constipation.    Vision is fine.     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  He had renal cell carcinoma and is status post nephrectomy.  He is on Lipitor, losartan and metoprolol.   Also on finasteride for BPH.    I personally reviewed the recent MRIs.  I discussed the results with him and his wife.  I believe the changes in the brain are much more consistent with mild age-related chronic microvascular ischemic change rather than MS.  Specifically, there is nothing in the brain that should explain right leg weakness.  The MRI of the lumbar spine shows multilevel degenerative changes and there is some encroachment on nerve roots as detailed below.  Imaging: MRI of the brain 01/29/2021 shows scattered T2/FLAIR hyperintense foci, predominantly in the subcortical and deep white matter of the hemispheres with a few foci in the pons and one in the cerebellum.  Of note, none of the foci appears to be juxtacortical or in the callososeptal fibers.  MRI of the lumbar spine 01/29/2021 shows multilevel degenerative changes.  At L2-L3 there is moderate left lateral recess stenosis with some encroachment on the left L3 nerve root.  At L3-L4, there is no significant nerve root encroachment.  At L4-L5, there are degenerative changes causing mild spinal stenosis with moderately severe right foraminal and lateral recess stenosis  and moderate left foraminal and lateral recess stenosis.  There is potential for right L4 and L5 nerve root compression.  At L5-S1, there is moderate right lateral recess stenosis that could affect the right S1 nerve root.   REVIEW OF SYSTEMS: Constitutional: No fevers, chills, sweats, or change in appetite Eyes: No visual changes, double vision, eye pain Ear, nose and throat: No hearing loss, ear pain, nasal congestion, sore throat Cardiovascular: No chest pain, palpitations Respiratory: No shortness of breath at rest or with exertion.   No  wheezes GastrointestinaI: No nausea, vomiting, diarrhea, abdominal pain, fecal incontinence Genitourinary: No dysuria, urinary retention or frequency.  No nocturia. Musculoskeletal:He notes crunching in his neck when he moves it at times  He has lower back pain.   Ntegumentary: No rash, pruritus, skin lesions Neurological: as above Psychiatric: No depression at this time.  No anxiety Endocrine: No palpitations, diaphoresis, change in appetite, change in weigh or increased thirst Hematologic/Lymphatic: No anemia, purpura, petechiae. Allergic/Immunologic: No itchy/runny eyes, nasal congestion, recent allergic reactions, rashes  ALLERGIES: Allergies  Allergen Reactions  . Alcohol Other (See Comments)    Pts skin gets red. Ex. Pt used alcohol based deodorant and got red things under arms. & Drinks alcohol notices "from heart on up" everything turns red.  . Chlorthalidone     Dizziness and Syncopal Episode    HOME MEDICATIONS:  Current Outpatient Medications:  .  aspirin EC 81 MG EC tablet, Take 1 tablet (81 mg total) by mouth daily., Disp: , Rfl:  .  atorvastatin (LIPITOR) 80 MG tablet, Take 1 tablet (80 mg total) by mouth daily., Disp: 90 tablet, Rfl: 3 .  finasteride (PROSCAR) 5 MG tablet, Take 5 mg by mouth daily., Disp: , Rfl: 3 .  metoprolol succinate (TOPROL-XL) 25 MG 24 hr tablet, TAKE 1 TABLET BY MOUTH DAILY (Patient taking differently: Take 25 mg by mouth daily.), Disp: 90 tablet, Rfl: 3 .  losartan (COZAAR) 100 MG tablet, Take 1 tablet (100 mg total) by mouth daily., Disp: 90 tablet, Rfl: 3  PAST MEDICAL HISTORY: Past Medical History:  Diagnosis Date  . Arthritis   . Coronary atherosclerosis of native coronary artery    a. NSTEMI (02/2014):  LHC (02/2014):  Mild disease in LAD and CFX; prox RCA occluded with R-R collats, dist RCA filled by L-R collats, inf HK, EF 55%, LVEDP 15 mmHg.  PCI:  Unsuccessful angioplasty of RCA (late presentation of inf MI) - tx medically.  .  Essential hypertension   . GERD (gastroesophageal reflux disease)   . Hyperlipidemia   . NSTEMI (non-ST elevated myocardial infarction) ([place]) 03/11/14  . Renal cell carcinoma of right kidney (HCC)    Partial nephrectomy in 2013    PAST SURGICAL HISTORY: Past Surgical History:  Procedure Laterality Date  . BACK SURGERY     1989  . COLONOSCOPY N/A 12/11/2020   Procedure: COLONOSCOPY;  Surgeon: Aviva Signs, MD;  Location: [facility];  Service: Gastroenterology;  Laterality: N/A;  . LEFT HEART CATHETERIZATION WITH CORONARY ANGIOGRAM N/A 03/17/2014   Procedure: LEFT HEART CATHETERIZATION WITH CORONARY ANGIOGRAM;  Surgeon: Jettie Booze, MD;  Location: [facility] CATH LAB;  Service: Cardiovascular;  Laterality: N/A;  . POLYPECTOMY  12/11/2020   Procedure: POLYPECTOMY;  Surgeon: Aviva Signs, MD;  Location: [facility];  Service: Gastroenterology;;  . Kristine Royal ASSISTED LAPAROSCOPIC NEPHRECTOMY  01/14/2012   Procedure: ROBOTIC ASSISTED LAPAROSCOPIC NEPHRECTOMY;  Surgeon: Molli Hazard, MD;  Location: [facility] ORS;  Service: Urology;  Laterality:  Right;  Robot Laparoscopic Right Partial Nephrectomy    . TONSILLECTOMY      FAMILY HISTORY: Family History  Problem Relation Age of Onset  . Renal Disease Daughter   . Hypertension Daughter     SOCIAL HISTORY:  Social History   Socioeconomic History  . Marital status: Married    Spouse name: Not on file  . Number of children: 1  . Years of education: 30  . Highest education level: Not on file  Occupational History  . Occupation: Retired   Tobacco Use  . Smoking status: Former Smoker    Types: Cigarettes    Quit date: 11/18/1979    Years since quitting: 41.2  . Smokeless tobacco: Never Used  Vaping Use  . Vaping Use: Never used  Substance and Sexual Activity  . Alcohol use: No    Alcohol/week: 0.0 standard drinks  . Drug use: Yes    Types: Marijuana  . Sexual activity: Not on file  Other Topics Concern  . Not on file  Social  History Narrative   Lives w/ wife   Caffeine use: 1 cup coffee every morning   Right handed   Social Determinants of Health   Financial Resource Strain: Not on file  Food Insecurity: Not on file  Transportation Needs: Not on file  Physical Activity: Not on file  Stress: Not on file  Social Connections: Not on file  Intimate Partner Violence: Not on file     PHYSICAL EXAM  Vitals:   02/07/21 1306  BP: (!) 138/92  Pulse: 64  Weight: 178 lb (80.7 kg)  Height: 5\' 8"  (1.727 m)    Body mass index is 27.06 kg/m.   General: The patient is well-developed and well-nourished and in no acute distress  HEENT:  Head is [place]/AT.  Sclera are anicteric.    Neck: No carotid bruits are noted.  The neck is nontender.  Cardiovascular: The heart has a regular rate and rhythm with a normal S1 and S2. There were no murmurs, gallops or rubs.    Skin: Extremities are without rash or  edema.  Musculoskeletal:  Back is nontender  Neurologic Exam  Mental status: The patient is alert and oriented x 3 at the time of the examination. The patient has apparent normal recent and remote memory, with an apparently normal attention span and concentration ability.   Speech is normal.  Cranial nerves: Extraocular movements are full. Pupils are equal, round, and reactive to light and accomodation.  Color vision is normal.  Facial symmetry is present. There is good facial sensation to soft touch bilaterally.Facial strength is normal.  Trapezius and sternocleidomastoid strength is normal. No dysarthria is noted.  The tongue is midline, and the patient has symmetric elevation of the soft palate. No obvious hearing deficits are noted.  Motor:  Muscle bulk is normal.   Tone is normal. Strength is  5 / 5 in arms and left leg but 4/5 in hip flexion and ankle/toe and 4+/5 elsewhere  Sensory: Sensory testing is intact to pinprick, soft touch and vibration sensation in all 4 extremities.  Coordination: Cerebellar  testing reveals good finger-nose-finger and heel-to-shin bilaterally.  Gait and station: Station is normal.   Gait has mild right foot drop.  . Tandem gait is mildly wide. Romberg is negative.   Reflexes: Deep tendon reflexes are symmetric and normal in arms but increased in legs (spread at knees and 2 beats nonsustained .   Plantar responses are flexor.  DIAGNOSTIC DATA (LABS, IMAGING, TESTING) - I reviewed patient records, labs, notes, testing and imaging myself where available.  Lab Results  Component Value Date   WBC 7.4 09/20/2020   HGB 14.9 09/20/2020   HCT 44.8 09/20/2020   MCV 89.8 09/20/2020   PLT 238 09/20/2020      Component Value Date/Time   NA 140 09/20/2020 0820   K 4.8 09/20/2020 0820   CL 106 09/20/2020 0820   CO2 25 09/20/2020 0820   GLUCOSE 109 (H) 09/20/2020 0820   BUN 18 09/20/2020 0820   CREATININE 0.94 09/20/2020 0820   CALCIUM 9.4 09/20/2020 0820   PROT 6.4 09/20/2020 0820   ALBUMIN 4.3 04/16/2016 1128   AST 17 09/20/2020 0820   ALT 18 09/20/2020 0820   ALKPHOS 75 04/16/2016 1128   BILITOT 0.6 09/20/2020 0820   GFRNONAA >90 04/24/2014 2220   GFRAA >90 04/24/2014 2220   Lab Results  Component Value Date   CHOL 119 09/20/2020   HDL 43 09/20/2020   LDLCALC 59 09/20/2020   TRIG 90 09/20/2020   CHOLHDL 2.8 09/20/2020   Lab Results  Component Value Date   HGBA1C 5.8 (H) 09/20/2020       ASSESSMENT AND PLAN  Right leg weakness - Plan: MR CERVICAL SPINE WO CONTRAST, MR THORACIC SPINE WO CONTRAST  Allodynia - Plan: MR CERVICAL SPINE WO CONTRAST, MR THORACIC SPINE WO CONTRAST  Gait disturbance - Plan: MR CERVICAL SPINE WO CONTRAST, MR THORACIC SPINE WO CONTRAST  White matter abnormality on MRI of brain - Plan: MR CERVICAL SPINE WO CONTRAST, MR THORACIC SPINE WO CONTRAST  Hyperreflexia   In summary, [name] is a 65 year old man with right leg weakness and left greater than right leg sensory symptoms and occasional hand numbness who  was found to have some white matter foci on brain MRI.  He also has urinary urgency and on exam, he has hyperreflexia.  I personally reviewed the imaging studies.  The white matter foci noted in the brain a much more likely to represent chronic microvascular ischemic change then demyelination.  Therefore, I think MS is unlikely.  The changes in the lumbar spine could explain some leg symptoms.  However, because he is not experiencing pain radiculopathy is less likely to be the etiology.  I am most concerned about the possibility of an intrinsic or extrinsic myelopathy involving the cervical or thoracic spinal cord and we need to check MRI of the cervical and thoracic spine to exclude this possibility.  If he does have an extrinsic myelopathy caused by degenerative changes or severe spinal stenosis, we will refer to neurosurgery.  If he does have an intrinsic myelopathy with a demyelinating appearance, then the possibility of MS will be reconsidered.  I have scheduled a follow-up visit with me in 2 months for reassessment.  We will let him know the results of the MRI imaging and do additional testing or other referrals based on the findings.  Thank you for asking me to see [name].  Please let me know if I can be of further assistance with him or other patients in the future.  unlikekly MS   R/o myelopathy   hyperrefel   Marilee Ditommaso A. Felecia Shelling, MD, [facility] Shave 3/71/6967, 8:93 PM Certified in Neurology, Clinical Neurophysiology, Sleep Medicine and Neuroimaging  [facility] [address], [place], [place] [address] [phone]

## 2021-02-11 ENCOUNTER — Telehealth: Payer: Self-pay | Admitting: Neurology

## 2021-02-11 NOTE — Telephone Encounter (Signed)
Medicare/tricare order sent to GI. No auth they will reach out to the patient to schedule.  

## 2021-02-14 ENCOUNTER — Other Ambulatory Visit: Payer: Self-pay

## 2021-02-14 ENCOUNTER — Ambulatory Visit
Admission: RE | Admit: 2021-02-14 | Discharge: 2021-02-14 | Disposition: A | Discharge status: Home / Self Care | Payer: Medicare Other | Source: Ambulatory Visit | Attending: Neurology | Admitting: Neurology

## 2021-02-14 DIAGNOSIS — R208 Other disturbances of skin sensation: Secondary | ICD-10-CM

## 2021-02-14 DIAGNOSIS — R29898 Other symptoms and signs involving the musculoskeletal system: Secondary | ICD-10-CM

## 2021-02-14 DIAGNOSIS — R9082 White matter disease, unspecified: Secondary | ICD-10-CM

## 2021-02-14 DIAGNOSIS — R269 Unspecified abnormalities of gait and mobility: Secondary | ICD-10-CM

## 2021-02-15 ENCOUNTER — Telehealth: Payer: Self-pay | Admitting: Neurology

## 2021-02-15 DIAGNOSIS — R29898 Other symptoms and signs involving the musculoskeletal system: Secondary | ICD-10-CM

## 2021-02-15 DIAGNOSIS — R208 Other disturbances of skin sensation: Secondary | ICD-10-CM

## 2021-02-15 DIAGNOSIS — R292 Abnormal reflex: Secondary | ICD-10-CM

## 2021-02-15 DIAGNOSIS — G992 Myelopathy in diseases classified elsewhere: Secondary | ICD-10-CM

## 2021-02-15 DIAGNOSIS — M4802 Spinal stenosis, cervical region: Secondary | ICD-10-CM | POA: Insufficient documentation

## 2021-02-15 NOTE — Telephone Encounter (Signed)
I spoke with [name] about the MRIs.  The MRI of the cervical spine shows abnormal signal adjacent to C4-C5 primarily involving the bilateral gray matter including the anterior horn.  This is most consistent with compressive myelopathy.  Interestingly, there appears to only be mild to moderate spinal stenosis at that level.  Additionally, there is 2 mm anterolisthesis.  He also has spinal stenosis at other cervical levels.  The MRI of the thoracic spine was normal for age..  I would like him to see neurosurgery and we will set up an appointment for him.

## 2021-04-16 ENCOUNTER — Ambulatory Visit: Payer: Medicare Other | Admitting: Neurology

## 2021-04-29 HISTORY — PX: CERVICAL DISC SURGERY: SHX588

## 2021-05-09 ENCOUNTER — Other Ambulatory Visit: Payer: Self-pay

## 2021-05-09 ENCOUNTER — Telehealth (INDEPENDENT_AMBULATORY_CARE_PROVIDER_SITE_OTHER): Payer: Medicare Other | Admitting: Student

## 2021-05-09 ENCOUNTER — Encounter: Payer: Self-pay | Admitting: Student

## 2021-05-09 VITALS — BP 138/84 | Ht 68.0 in | Wt 175.0 lb

## 2021-05-09 DIAGNOSIS — R7303 Prediabetes: Secondary | ICD-10-CM | POA: Diagnosis not present

## 2021-05-09 DIAGNOSIS — I2510 Atherosclerotic heart disease of native coronary artery without angina pectoris: Secondary | ICD-10-CM

## 2021-05-09 DIAGNOSIS — E785 Hyperlipidemia, unspecified: Secondary | ICD-10-CM

## 2021-05-09 DIAGNOSIS — I10 Essential (primary) hypertension: Secondary | ICD-10-CM

## 2021-05-09 NOTE — Progress Notes (Signed)
Virtual Visit via Telephone Note   This visit type was conducted due to national recommendations for restrictions regarding the COVID-19 Pandemic (e.g. social distancing) in an effort to limit this patient's exposure and mitigate transmission in our community.  Due to his co-morbid illnesses, this patient is at least at moderate risk for complications without adequate follow up.  This format is felt to be most appropriate for this patient at this time.  The patient did not have access to video technology/had technical difficulties with video requiring transitioning to audio format only (telephone).  All issues noted in this document were discussed and addressed.  No physical exam could be performed with this format.  Please refer to the patient's chart for his  consent to telehealth for [facility].    Date:  05/09/2021   ID:  [name], [name] 08-23-1956, MRN [id number] The patient was identified using 2 identifiers.  Patient Location: Home Provider Location: Office/Clinic  PCP:  Rory Percy, MD   [facility] Providers Cardiologist:  Rozann Lesches, MD {  Evaluation Performed:  Follow-Up Visit  Chief Complaint: 6 month follow-up  History of Present Illness:    [name] is a 65 y.o. male with past medical history of CAD (s/p NSTEMI in 02/2014 with cath showing occlusion of RCA filled by collaterals and unsuccessful PCI with medical management recommended), HTN, HLD and history of RCC (s/p partial nephrectomy in 2013) who presents for a 45-month follow-up telehealth visit.   He was last examined by myself in 08/2020 and denied any recent anginal symptoms but did report bilateral leg weakness and paraesthesias. Symptoms did not seem consistent with claudication and he had palpable distal pulses on examination. A referral to Orthopedics was reviewed but he wished to hold off on a referral at that time. Repeat labs were obtained and showed normal Hgb, platelets, electrolytes,  kidney function and liver function. LDL was down to 59 and Hgb A1c was mildly elevated to 5.8 and consistent with prediabetes.   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  In talking with the patient today, he reports he switched from an in office visit to a telehealth visit as he recently underwent back surgery on 04/29/2021. He reports this did help with paresthesias and numbness he was previously experiencing along his left arm but he continues to have weakness along his legs and is hopeful this will improve. Misses playing golf. He has been under increased stress as his daughter passed away earlier this year and his mom passed away last month.  While his activity has been more limited, he denies any recent chest pain or exertional dyspnea. No recent orthopnea, PND, lower extremity edema or palpitations. Says he does feel weaker due to not being able to do his routine activities since his surgery.  The patient does not have symptoms concerning for COVID-19 infection (fever, chills, cough, or new shortness of breath).    Past Medical History:  Diagnosis Date   Arthritis    Coronary atherosclerosis of native coronary artery    a. NSTEMI (02/2014):  LHC (02/2014):  Mild disease in LAD and CFX; prox RCA occluded with R-R collats, dist RCA filled by L-R collats, inf HK, EF 55%, LVEDP 15 mmHg.  PCI:  Unsuccessful angioplasty of RCA (late presentation of inf MI) - tx medically.   Essential hypertension    GERD (gastroesophageal reflux disease)  Hyperlipidemia    NSTEMI (non-ST elevated myocardial infarction) ([place]) 03/11/14   Renal cell carcinoma of right kidney [facility])    Partial nephrectomy in 2013   Past Surgical History:  Procedure Laterality Date   [place]   COLONOSCOPY N/A 12/11/2020   Procedure: COLONOSCOPY;  Surgeon: Aviva Signs, MD;  [facility];  Service:  Gastroenterology;  Laterality: N/A;   LEFT HEART CATHETERIZATION WITH CORONARY ANGIOGRAM N/A 03/17/2014   Procedure: LEFT HEART CATHETERIZATION WITH CORONARY ANGIOGRAM;  Surgeon: Jettie Booze, MD;  Location[facility] C[facility] LAB;  Service: Cardiovascular;  Laterality: N/A;   POLYPECTOMY  12/11/2020   Procedure: POLYPECTOMY;  Surgeon: Aviva Signs, MD;  Location[facility];  Service: Gastroenterology;;   ROBOT ASSISTED LAPAROSCOPIC NEPHRECTOMY  01/14/2012   Procedure: ROBOTIC ASSISTED LAPAROSCOPIC NEPHRECTOMY;  Surgeon: Molli Hazard, MD;  Location: [facility] ORS;  Service: Urology;  Laterality: Right;  Robot Laparoscopic Right Partial Nephrectomy     TONSILLECTOMY       Current Meds  Medication Sig   atorvastatin (LIPITOR) 80 MG tablet Take 1 tablet (80 mg total) by mouth daily.   finasteride (PROSCAR) 5 MG tablet Take 5 mg by mouth daily.   losartan (COZAAR) 100 MG tablet Take 1 tablet (100 mg total) by mouth daily.   metoprolol succinate (TOPROL-XL) 25 MG 24 hr tablet TAKE 1 TABLET BY MOUTH DAILY (Patient taking differently: Take 25 mg by mouth daily.)     Allergies:   Alcohol and Chlorthalidone   Social History   Tobacco Use   Smoking status: Former    Pack years: 0.00    Types: Cigarettes    Quit date: 11/18/1979    Years since quitting: 41.5   Smokeless tobacco: Never  Vaping Use   Vaping Use: Never used  Substance Use Topics   Alcohol use: No    Alcohol/week: 0.0 standard drinks   Drug use: Yes    Types: Marijuana     Family Hx: The patient's family history includes Hypertension in his daughter; Renal Disease in his daughter.  ROS:   Please see the history of present illness.     All other systems reviewed and are negative.   Prior CV studies:   The following studies were reviewed today:  Cardiac Catheterization: 03/2014 IMPRESSIONS:   Normal left main coronary artery. Mild disease in the left anterior descending artery and its branches. Mild disease in  the left circumflex artery and its branches. Occluded right coronary artery.  PTCA of the proximal right coronary artery. This did not improve visualization of the RCA. Distal RCA fills by left to right, and right to right collaterals. Overall normal left ventricular systolic function.  LVEDP 15 mmHg.  Ejection fraction 55 %. 6.   Mildly dilated aortic root.   RECOMMENDATION:  Unsuccessful angioplasty, likely due to late presentation of MI. The proximal RCA has likely been occluded for several days. We will have to treat his symptoms medically. I think he will likely have some good days and bad days in the next few weeks. We'll start Plavix. We'll restart heparin after a few hours so he can complete 48 hours of anticoagulation.  Continue ACE inhibitor, beta blocker, statin and Plavix at discharge for medical therapy of his inferior MI.   Labs/Other Tests and Data Reviewed:    EKG:  An ECG dated 09/12/2020 was personally reviewed today and demonstrated:  NSR, HR 70 with TWI along the inferior leads which is  similar to prior tracings.   Recent Labs: 09/20/2020: ALT 18; BUN 18; Creat 0.94; Hemoglobin 14.9; Platelets 238; Potassium 4.8; Sodium 140   Recent Lipid Panel Lab Results  Component Value Date/Time   CHOL 119 09/20/2020 08:20 AM   TRIG 90 09/20/2020 08:20 AM   HDL 43 09/20/2020 08:20 AM   CHOLHDL 2.8 09/20/2020 08:20 AM   LDLCALC 59 09/20/2020 08:20 AM    Wt Readings from Last 3 Encounters:  05/09/21 175 lb (79.4 kg)  02/07/21 178 lb (80.7 kg)  12/11/20 180 lb (81.6 kg)         Objective:    Vital Signs:  BP 138/84   Ht 5\' 8"  (1.727 m)   Wt 175 lb (79.4 kg)   BMI 26.61 kg/m    General: Pleasant male sounding in no acute distress.  Psych: Normal affect. Neuro: Alert and oriented X 3.  Lungs:  Resp regular and unlabored while talking on the phone.   ASSESSMENT & PLAN:    1. CAD - He is s/p NSTEMI in 02/2014 with cath showing occlusion of RCA filled by collaterals and  unsuccessful PCI with medical management recommended. - While his activity has been more limited given his recent back surgery, he denies any recent chest pain or dyspnea on exertion.  - Continue Toprol-XL 25 mg daily and Atorvastatin 80 mg daily.  He did recently stop ASA due to a news story saying this was no longer indicated. Given his history of CAD, I did recommend that he restart ASA 81 mg daily if tolerating well given his history of CAD.   2.  HTN - His blood pressure was well-controlled at 138/84 on most recent check. Continue current medication regimen with Toprol-XL 25 mg daily and Losartan 100 mg daily.  3. HLD - FLP in 09/2020 showed total cholesterol 119, HDL 43, triglycerides 90 and LDL 59. At goal of LDL less than 70. Continue Atorvastatin 80 mg daily. Will plan to obtain repeat FLP/LFT's prior to his next visit.  4. Prediabetes - His Hgb A1c was mildly elevated to 5.8 in 09/2020. Will recheck with his next set of labs.     Time:   Today, I have spent 15 minutes with the patient with telehealth technology discussing the above problems.     Medication Adjustments/Labs and Tests Ordered: Current medicines are reviewed at length with the patient today.  Concerns regarding medicines are outlined above.   Tests Ordered: Orders Placed This Encounter  Procedures   CBC   Comprehensive metabolic panel   Lipid Profile   HgB A1c    Medication Changes: No orders of the defined types were placed in this encounter.   Follow Up:  In Person in 6 month(s)  Signed, Erma Heritage, PA-C  05/09/2021 1:02 PM    [place]

## 2021-05-09 NOTE — Patient Instructions (Signed)
Medication Instructions:  Your physician recommends that you continue on your current medications as directed. Please refer to the Current Medication list given to you today.  *If you need a refill on your cardiac medications before your next appointment, please call your pharmacy*   Lab Work: Fasting Lipids, CBC, CMET, A1C in 6 months BEFORE next visit   If you have labs (blood work) drawn today and your tests are completely normal, you will receive your results only by: [place] (if you have MyChart) OR A paper copy in the mail If you have any lab test that is abnormal or we need to change your treatment, we will call you to review the results.   Testing/Procedures: None today    Follow-Up: At [facility], you and your health needs are our priority.  As part of our continuing mission to provide you with exceptional heart care, we have created designated Provider Care Teams.  These Care Teams include your primary Cardiologist (physician) and Advanced Practice Providers (APPs -  Physician Assistants and Nurse Practitioners) who all work together to provide you with the care you need, when you need it.  We recommend signing up for the patient portal called "MyChart".  Sign up information is provided on this After Visit Summary.  MyChart is used to connect with patients for Virtual Visits (Telemedicine).  Patients are able to view lab/test results, encounter notes, upcoming appointments, etc.  Non-urgent messages can be sent to your provider as well.   To learn more about what you can do with MyChart, go to [email or web address].    Your next appointment:   6 month(s)  The format for your next appointment:   In Person  Provider:   You may see Rozann Lesches, MD or one of the following Advanced Practice Providers on your designated Care Team:   Bernerd Pho, PA-C     Other Instructions None

## 2021-09-05 ENCOUNTER — Other Ambulatory Visit: Payer: Self-pay | Admitting: Student

## 2021-09-12 ENCOUNTER — Other Ambulatory Visit: Payer: Self-pay | Admitting: Student

## 2021-09-20 ENCOUNTER — Telehealth: Payer: Self-pay | Admitting: Student

## 2021-09-20 MED ORDER — METOPROLOL SUCCINATE ER 25 MG PO TB24: ORAL_TABLET | ORAL | 3 refills | Status: DC

## 2021-09-20 MED ORDER — ATORVASTATIN CALCIUM 80 MG PO TABS: 80.0000 mg | ORAL_TABLET | Freq: Every day | ORAL | 3 refills | Status: DC

## 2021-09-20 MED ORDER — LOSARTAN POTASSIUM 100 MG PO TABS: 100.0000 mg | ORAL_TABLET | Freq: Every day | ORAL | 3 refills | Status: DC

## 2021-09-20 NOTE — Telephone Encounter (Signed)
*  STAT* If patient is at the pharmacy, call can be transferred to refill team.   1. Which medications need to be refilled? (please list name of each medication and dose if known)  atorvastatin (LIPITOR) 80 MG tablet losartan (COZAAR) 100 MG tablet metoprolol succinate (TOPROL-XL) 25 MG 24 hr tablet  2. Which pharmacy/location (including street and city if local pharmacy) is medication to be sent to? [place] [place] Phone:  [phone]         3. Do they need a 30 day or 90 day supply? 30 with refills

## 2021-11-07 ENCOUNTER — Ambulatory Visit: Payer: Medicare Other | Admitting: Cardiology

## 2021-11-07 NOTE — Progress Notes (Deleted)
Cardiology Office Note  Date: 11/07/2021   ID: Rodman, Recupero [birth year], MRN [id number]  PCP:  Rory Percy, MD  Cardiologist:  Rozann Lesches, MD Electrophysiologist:  None   No chief complaint on file.   History of Present Illness: [name] is a 65 y.o. male last assessed via telehealth encounter in June by Ms. Strader PA-C.  Past Medical History:  Diagnosis Date   Arthritis    Coronary atherosclerosis of native coronary artery    a. NSTEMI (02/2014):  LHC (02/2014):  Mild disease in LAD and CFX; prox RCA occluded with R-R collats, dist RCA filled by L-R collats, inf HK, EF 55%, LVEDP 15 mmHg.  PCI:  Unsuccessful angioplasty of RCA (late presentation of inf MI) - tx medically.   Essential hypertension    GERD (gastroesophageal reflux disease)    Hyperlipidemia    NSTEMI (non-ST elevated myocardial infarction) ([place]) 03/11/14   Renal cell carcinoma of right kidney ([place])    Partial nephrectomy in 2013    Past Surgical History:  Procedure Laterality Date   [place]   COLONOSCOPY N/A 12/11/2020   Procedure: COLONOSCOPY;  Surgeon: Aviva Signs, MD;  [facility];  Service: Gastroenterology;  Laterality: N/A;   LEFT HEART CATHETERIZATION WITH CORONARY ANGIOGRAM N/A 03/17/2014   Procedure: LEFT HEART CATHETERIZATION WITH CORONARY ANGIOGRAM;  Surgeon: Jettie Booze, MD;  Location: [facility] CATH LAB;  Service: Cardiovascular;  Laterality: N/A;   POLYPECTOMY  12/11/2020   Procedure: POLYPECTOMY;  Surgeon: Aviva Signs, MD;  [facility];  Service: Gastroenterology;;   ROBOT ASSISTED LAPAROSCOPIC NEPHRECTOMY  01/14/2012   Procedure: ROBOTIC ASSISTED LAPAROSCOPIC NEPHRECTOMY;  Surgeon: Molli Hazard, MD;  Location: [facility] OR[facility];  Service: Urology;  Laterality: Right;  Robot Laparoscopic Right Partial Nephrectomy     TONSILLECTOMY      Current Outpatient Medications  Medication Sig Dispense Refill   aspirin EC 81 MG EC tablet Take  1 tablet (81 mg total) by mouth daily. (Patient not taking: Reported on 05/09/2021)     atorvastatin (LIPITOR) 80 MG tablet Take 1 tablet (80 mg total) by mouth daily. Pt needs to keep upcoming appt in Dec for additional refills 90 tablet 3   finasteride (PROSCAR) 5 MG tablet Take 5 mg by mouth daily.  3   losartan (COZAAR) 100 MG tablet Take 1 tablet (100 mg total) by mouth daily. 90 tablet 3   metoprolol succinate (TOPROL-XL) 25 MG 24 hr tablet TAKE 1 TABLET BY MOUTH EVERY DAY 90 tablet 3   No current facility-administered medications for this visit.   Allergies:  Alcohol and Chlorthalidone   Social History: The patient  reports that he quit smoking about 42 years ago. His smoking use included cigarettes. He has never used smokeless tobacco. He reports current drug use. Drug: Marijuana. He reports that he does not drink alcohol.   Family History: The patient's family history includes Hypertension in his daughter; Renal Disease in his daughter.   ROS:  Please see the history of present illness. Otherwise, complete review of systems is positive for {NONE DEFAULTED:18576}.  All other systems are reviewed and negative.   Physical Exam: VS:  There were no vitals taken for this visit., BMI There is no height or weight on file to calculate BMI.  Wt Readings from Last 3 Encounters:  05/09/21 175 lb (79.4 kg)  02/07/21 178 lb (80.7 kg)  12/11/20 180 lb (81.6 kg)  General: Patient appears comfortable at rest. HEENT: Conjunctiva and lids normal, oropharynx clear with moist mucosa. Neck: Supple, no elevated JVP or carotid bruits, no thyromegaly. Lungs: Clear to auscultation, nonlabored breathing at rest. Cardiac: Regular rate and rhythm, no S3 or significant systolic murmur, no pericardial rub. Abdomen: Soft, nontender, no hepatomegaly, bowel sounds present, no guarding or rebound. Extremities: No pitting edema, distal pulses 2+. Skin: Warm and dry. Musculoskeletal: No  kyphosis. Neuropsychiatric: Alert and oriented x3, affect grossly appropriate.  ECG:  An ECG dated 09/12/2020 was personally reviewed today and demonstrated:  Sinus rhythm with nonspecific T wave abnormalities similar to prior tracing.  Recent Labwork:    Component Value Date/Time   CHOL 119 09/20/2020 0820   TRIG 90 09/20/2020 0820   HDL 43 09/20/2020 0820   CHOLHDL 2.8 09/20/2020 0820   VLDL 25 05/18/2017 0913   LDLCALC 59 09/20/2020 0820    Other Studies Reviewed Today:  Cardiac catheterization with attempted PTCA 03/17/2014: IMPRESSIONS:    Normal left main coronary artery. Mild disease in the left anterior descending artery and its branches. Mild disease in the left circumflex artery and its branches. Occluded right coronary artery.  PTCA of the proximal right coronary artery. This did not improve visualization of the RCA. Distal RCA fills by left to right, and right to right collaterals. Overall normal left ventricular systolic function.  LVEDP 15 mmHg.  Ejection fraction 55 %.           6.   Mildly dilated aortic root.  Assessment and Plan:    Medication Adjustments/Labs and Tests Ordered: Current medicines are reviewed at length with the patient today.  Concerns regarding medicines are outlined above.   Tests Ordered: No orders of the defined types were placed in this encounter.   Medication Changes: No orders of the defined types were placed in this encounter.   Disposition:  Follow up {follow up:15908}  Signed, Satira Sark, MD, [facility] 11/07/2021 9:32 AM    [facility] [place], [place], [place] [address] Phone: [phone]; Fax: [phone]

## 2021-12-27 ENCOUNTER — Ambulatory Visit: Payer: Medicare Other | Admitting: Student

## 2021-12-27 NOTE — Progress Notes (Deleted)
Cardiology Office Note    Date:  12/27/2021   ID:  [name] [birth year], MRN [id number]  PCP:  Rory Percy, MD  Cardiologist: Rozann Lesches, MD    No chief complaint on file.   History of Present Illness:    [name] is a 66 y.o. male with past medical history of CAD (s/p NSTEMI in 02/2014 with cath showing occlusion of RCA filled by collaterals and unsuccessful PCI with medical management recommended), HTN, HLD and history of RCC (s/p partial nephrectomy in 2013) who presents to the office today for 7-month follow-up.  He most recently had a telehealth visit with myself in 04/2021 and was recovering for a recent back surgery. Activity was more limited but he denied any recent anginal symptoms. Was continued on ASA, Toprol-XL, Losartan and Atorvastatin. It was recommended he have follow-up labs including CBC, CMET, FLP and Hgb A1c but these have not been obtained.    Past Medical History:  Diagnosis Date   Arthritis    Coronary atherosclerosis of native coronary artery    a. NSTEMI (02/2014):  LHC (02/2014):  Mild disease in LAD and CFX; prox RCA occluded with R-R collats, dist RCA filled by L-R collats, inf HK, EF 55%, LVEDP 15 mmHg.  PCI:  Unsuccessful angioplasty of RCA (late presentation of inf MI) - tx medically.   Essential hypertension    GERD (gastroesophageal reflux disease)    Hyperlipidemia    NSTEMI (non-ST elevated myocardial infarction) ([place]) 03/11/14   Renal cell carcinoma of right kidney ([place])    Partial nephrectomy in 2013    Past Surgical History:  Procedure Laterality Date   [place]   COLONOSCOPY N/A 12/11/2020   Procedure: COLONOSCOPY;  Surgeon: Aviva Signs, MD;  Location: [facility];  Service: Gastroenterology;  Laterality: N/A;   LEFT HEART CATHETERIZATION WITH CORONARY ANGIOGRAM N/A 03/17/2014   Procedure: LEFT HEART CATHETERIZATION WITH CORONARY ANGIOGRAM;  Surgeon: Jettie Booze, MD;  Location: [facility] CATH LAB;   Service: Cardiovascular;  Laterality: N/A;   POLYPECTOMY  12/11/2020   Procedure: POLYPECTOMY;  Surgeon: Aviva Signs, MD;  Location: [facility];  Service: Gastroenterology;;   ROBOT ASSISTED LAPAROSCOPIC NEPHRECTOMY  01/14/2012   Procedure: ROBOTIC ASSISTED LAPAROSCOPIC NEPHRECTOMY;  Surgeon: Molli Hazard, MD;  Location: [facility];  Service: Urology;  Laterality: Right;  Robot Laparoscopic Right Partial Nephrectomy     TONSILLECTOMY      Current Medications: Outpatient Medications Prior to Visit  Medication Sig Dispense Refill   aspirin EC 81 MG EC tablet Take 1 tablet (81 mg total) by mouth daily. (Patient not taking: Reported on 05/09/2021)     atorvastatin (LIPITOR) 80 MG tablet Take 1 tablet (80 mg total) by mouth daily. Pt needs to keep upcoming appt in Dec for additional refills 90 tablet 3   finasteride (PROSCAR) 5 MG tablet Take 5 mg by mouth daily.  3   losartan (COZAAR) 100 MG tablet Take 1 tablet (100 mg total) by mouth daily. 90 tablet 3   metoprolol succinate (TOPROL-XL) 25 MG 24 hr tablet TAKE 1 TABLET BY MOUTH EVERY DAY 90 tablet 3   No facility-administered medications prior to visit.     Allergies:   Alcohol and Chlorthalidone   Social History   Socioeconomic History   Marital status: Married    Spouse name: Not on file   Number of children: 1   Years of education: 14   Highest education  level: Not on file  Occupational History   Occupation: Retired   Tobacco Use   Smoking status: Former    Types: Cigarettes    Quit date: 11/18/1979    Years since quitting: 42.1   Smokeless tobacco: Never  Vaping Use   Vaping Use: Never used  Substance and Sexual Activity   Alcohol use: No    Alcohol/week: 0.0 standard drinks   Drug use: Yes    Types: Marijuana   Sexual activity: Not on file  Other Topics Concern   Not on file  Social History Narrative   Lives w/ wife   Caffeine use: 1 cup coffee every morning   Right handed   Social Determinants of Health    Financial Resource Strain: Not on file  Food Insecurity: Not on file  Transportation Needs: Not on file  Physical Activity: Not on file  Stress: Not on file  Social Connections: Not on file     Family History:  The patient's ***family history includes Hypertension in his daughter; Renal Disease in his daughter.   Review of Systems:    Please see the history of present illness.     All other systems reviewed and are otherwise negative except as noted above.   Physical Exam:    VS:  There were no vitals taken for this visit.   General: Well developed, well nourished,male appearing in no acute distress. Head: Normocephalic, atraumatic. Neck: No carotid bruits. JVD not elevated.  Lungs: Respirations regular and unlabored, without wheezes or rales.  Heart: ***Regular rate and rhythm. No S3 or S4.  No murmur, no rubs, or gallops appreciated. Abdomen: Appears non-distended. No obvious abdominal masses. Msk:  Strength and tone appear normal for age. No obvious joint deformities or effusions. Extremities: No clubbing or cyanosis. No edema.  Distal pedal pulses are 2+ bilaterally. Neuro: Alert and oriented X 3. Moves all extremities spontaneously. No focal deficits noted. Psych:  Responds to questions appropriately with a normal affect. Skin: No rashes or lesions noted  Wt Readings from Last 3 Encounters:  05/09/21 175 lb (79.4 kg)  02/07/21 178 lb (80.7 kg)  12/11/20 180 lb (81.6 kg)        Studies/Labs Reviewed:   EKG:  EKG is*** ordered today.  The ekg ordered today demonstrates ***  Recent Labs: No results found for requested labs within last 8760 hours.   Lipid Panel    Component Value Date/Time   CHOL 119 09/20/2020 0820   TRIG 90 09/20/2020 0820   HDL 43 09/20/2020 0820   CHOLHDL 2.8 09/20/2020 0820   VLDL 25 05/18/2017 0913   LDLCALC 59 09/20/2020 0820    Additional studies/ records that were reviewed today include:   Cardiac Catheterization:  03/2014 IMPRESSIONS:   Normal left main coronary artery. Mild disease in the left anterior descending artery and its branches. Mild disease in the left circumflex artery and its branches. Occluded right coronary artery.  PTCA of the proximal right coronary artery. This did not improve visualization of the RCA. Distal RCA fills by left to right, and right to right collaterals. Overall normal left ventricular systolic function.  LVEDP 15 mmHg.  Ejection fraction 55 %. 6.   Mildly dilated aortic root.   RECOMMENDATION:  Unsuccessful angioplasty, likely due to late presentation of MI. The proximal RCA has likely been occluded for several days. We will have to treat his symptoms medically. I think he will likely have some good days and bad days in the next  few weeks. We'll start Plavix. We'll restart heparin after a few hours so he can complete 48 hours of anticoagulation.  Continue ACE inhibitor, beta blocker, statin and Plavix at discharge for medical therapy of his inferior MI.    Assessment:    No diagnosis found.   Plan:   In order of problems listed above:  ***    Shared Decision Making/Informed Consent:   {Are you ordering a CV Procedure (e.g. stress test, cath, DCCV, TEE, etc)?   Press F2        :[id number]}    Medication Adjustments/Labs and Tests Ordered: Current medicines are reviewed at length with the patient today.  Concerns regarding medicines are outlined above.  Medication changes, Labs and Tests ordered today are listed in the Patient Instructions below. There are no Patient Instructions on file for this visit.   Signed, Erma Heritage, PA-C  12/27/2021 7:17 AM    [facility] [address] [place], [place] [address] Phone: [phone] Fax: [phone]

## 2021-12-31 ENCOUNTER — Ambulatory Visit: Payer: Medicare Other | Admitting: Student

## 2022-02-13 ENCOUNTER — Encounter: Payer: Self-pay | Admitting: Neurology

## 2022-02-13 ENCOUNTER — Ambulatory Visit (INDEPENDENT_AMBULATORY_CARE_PROVIDER_SITE_OTHER): Payer: Medicare Other | Admitting: Neurology

## 2022-02-13 VITALS — BP 158/92 | HR 52 | Ht 68.0 in | Wt 175.0 lb

## 2022-02-13 DIAGNOSIS — M5136 Other intervertebral disc degeneration, lumbar region: Secondary | ICD-10-CM

## 2022-02-13 DIAGNOSIS — R29898 Other symptoms and signs involving the musculoskeletal system: Secondary | ICD-10-CM

## 2022-02-13 DIAGNOSIS — R269 Unspecified abnormalities of gait and mobility: Secondary | ICD-10-CM | POA: Diagnosis not present

## 2022-02-13 DIAGNOSIS — R200 Anesthesia of skin: Secondary | ICD-10-CM

## 2022-02-13 DIAGNOSIS — G992 Myelopathy in diseases classified elsewhere: Secondary | ICD-10-CM | POA: Diagnosis not present

## 2022-02-13 DIAGNOSIS — R208 Other disturbances of skin sensation: Secondary | ICD-10-CM

## 2022-02-13 DIAGNOSIS — R292 Abnormal reflex: Secondary | ICD-10-CM | POA: Diagnosis not present

## 2022-02-13 DIAGNOSIS — M4802 Spinal stenosis, cervical region: Secondary | ICD-10-CM | POA: Diagnosis not present

## 2022-02-13 MED ORDER — BACLOFEN 10 MG PO TABS: 10.0000 mg | ORAL_TABLET | Freq: Three times a day (TID) | ORAL | 5 refills | Status: DC

## 2022-02-13 NOTE — Progress Notes (Signed)
? ?[facility] ? ?PATIENT: [name] ?DOB: 23-Apr-1956 ? ?REFERRING DOCTOR OR PCP:  Lanelle Bal, PA-C ?SOURCE: Patient, notes from primary care, imaging reports, MRI images (brain and lumbar spine) personally reviewed. ? ?_________________________________ ? ? ?HISTORICAL ? ?CHIEF COMPLAINT:  ?Chief Complaint  ?Patient presents with  ? Follow-up  ?  Pt alone, rm 7 (hall 3) he continues to have same problems. He had surgery in neck since last time being here. Since then he has had problems with upper body. Numbness and tingling. Still having lower weakness  ? ? ?HISTORY OF PRESENT ILLNESS:  ?[name], is a 66 y.o. man with cervical myelopathy: ? ?UPDATE 02/13/2022: ?MRI of the cervical spine 02/15/2021 showed myelopathy at [place] and he underwent spine surgery 04/29/2021 (Dr. Zada Finders).   He noted mild improvement in his fingers but over the last few months the arms are worse again.   Legs never showed improvement.   .    ? ?Currently, he has no leg pain but has difficulty raising the right leg up   He also has tingling and allodynia (touch is mildly tender) in the outer left leg in the thigh lower leg into the foot.     He has had urinary urgency, worse the last year.   He also notes some constipation.    Vision is fine.    ? ?He had an MI in 2015 and one artery is occluded.  He had renal cell carcinoma and is status post nephrectomy.  He is on Lipitor, losartan and metoprolol.   Also on finasteride for BPH.   ? ? ?From Consult 02/07/2021: ?He has a history of lumbar DJD and surgery who began to experience leg numbness and right leg weakness last summer.   Specifically noted reduced ability to lift the right leg.  This affected his gait.  When sitting down, he has trouble raising the right foot towards the left knee.  He notes altered sensation in the legs.  Additionally, he has chronic tingling/numbness in his right hand bit more recently has had tingling in the hand. ? ?He had surgery in  1988.    He also has known cervical DJD.  He has had right hand numbness since the late 1990's and MRi at the time showed disc pathology but he opted not to do surgery.   He throws his lower back about once or twice a year but is better a couple days to a week later.    ?I personally reviewed the recent MRIs.  I discussed the results with him and his wife.  I believe the changes in the brain are much more consistent with mild age-related chronic microvascular ischemic change rather than MS.  Specifically, there is nothing in the brain that should explain right leg weakness.  The MRI of the lumbar spine shows multilevel degenerative changes and there is some encroachment on nerve roots as detailed below. ? ?Imaging: ?MRI cervical spine 12/18/2021 (postoperative) shows C4-C5 ACDF.  There is decompression at that level compared to the 02/14/2021 MRI there is no longer spinal stenosis.  The myelopathy signal changes are still present at C4-C5 though they do not appear to be any worse.  Adjacent level disease is noted.  At C3-C4, there has been slight progression of the mild spinal stenosis.  There is no myelopathic signal.  There could be left C4 nerve root compression at this level.  There is mild spinal stenosis at C5-C6 that persists but does not appear to  be worse of note, there is prominent ligamenta flava hypertrophy on the left but no abnormal spinal cord signal is at C5-C6. ? ? ?MRI Cervical spine 02/14/2021 showed: ?1.    T2 hyperintense signal within the spinal cord adjacent to C4-C5 involving bilateral gray matter is most consistent with compressive myelopathy.  At this level, there is mild anterolisthesis and other degenerative changes causing mild to moderate spinal stenosis.  There is foraminal narrowing but no nerve root compression. ?2.    At C3-C4, there is borderline anterolisthesis due to degenerative changes causing mild spinal stenosis and foraminal narrowing but no nerve root compression. ?3.    At  C5-C6, there is mild spinal stenosis due to degenerative changes but no nerve root compression. ?4.    At C6-C7, there is borderline spinal stenosis but no nerve root compression. ? ?MRI thoracic spine showed   The spinal cord appears normal.     Minimal degenerative changes at T9-T10 and T11-T12 that do not lead to spinal stenosis or nerve root compression. ? ?MRI of the brain 01/29/2021 shows scattered T2/FLAIR hyperintense foci, predominantly in the subcortical and deep white matter of the hemispheres with a few foci in the pons and one in the cerebellum.  Of note, none of the foci appears to be juxtacortical or in the callososeptal fibers. ? ? ?MRI of the lumbar spine 01/29/2021 shows multilevel degenerative changes.  At L2-L3 there is moderate left lateral recess stenosis with some encroachment on the left L3 nerve root.  At L3-L4, there is no significant nerve root encroachment.  At L4-L5, there are degenerative changes causing mild spinal stenosis with moderately severe right foraminal and lateral recess stenosis and moderate left foraminal and lateral recess stenosis.  There is potential for right L4 and L5 nerve root compression.  At L5-S1, there is moderate right lateral recess stenosis that could affect the right S1 nerve root. ? ? ?REVIEW OF SYSTEMS: ?Constitutional: No fevers, chills, sweats, or change in appetite ?Eyes: No visual changes, double vision, eye pain ?Ear, nose and throat: No hearing loss, ear pain, nasal congestion, sore throat ?Cardiovascular: No chest pain, palpitations ?Respiratory:  No shortness of breath at rest or with exertion.   No wheezes ?GastrointestinaI: No nausea, vomiting, diarrhea, abdominal pain, fecal incontinence ?Genitourinary:  No dysuria, urinary retention or frequency.  No nocturia. ?Musculoskeletal: He notes crunching in his neck when he moves it at times  He has lower back pain.   ?Ntegumentary: No rash, pruritus, skin lesions ?Neurological: as above ?Psychiatric: No  depression at this time.  No anxiety ?Endocrine: No palpitations, diaphoresis, change in appetite, change in weigh or increased thirst ?Hematologic/Lymphatic:  No anemia, purpura, petechiae. ?Allergic/Immunologic: No itchy/runny eyes, nasal congestion, recent allergic reactions, rashes ? ?ALLERGIES: ?Allergies  ?Allergen Reactions  ? Alcohol Other (See Comments)  ?  Pts skin gets red. Ex. Pt used alcohol based deodorant and got red things under arms. & Drinks alcohol notices "from heart on up" everything turns red.  ? Chlorthalidone   ?  Dizziness and Syncopal Episode  ? ? ?HOME MEDICATIONS: ? ?Current Outpatient Medications:  ?  aspirin EC 81 MG EC tablet, Take 1 tablet (81 mg total) by mouth daily., Disp: , Rfl:  ?  atorvastatin (LIPITOR) 80 MG tablet, Take 1 tablet (80 mg total) by mouth daily. Pt needs to keep upcoming appt in Dec for additional refills, Disp: 90 tablet, Rfl: 3 ?  baclofen (LIORESAL) 10 MG tablet, Take 1 tablet (10 mg total)  by mouth 3 (three) times daily., Disp: 90 each, Rfl: 5 ?  finasteride (PROSCAR) 5 MG tablet, Take 5 mg by mouth daily., Disp: , Rfl: 3 ?  losartan (COZAAR) 100 MG tablet, Take 1 tablet (100 mg total) by mouth daily., Disp: 90 tablet, Rfl: 3 ?  metoprolol succinate (TOPROL-XL) 25 MG 24 hr tablet, TAKE 1 TABLET BY MOUTH EVERY DAY, Disp: 90 tablet, Rfl: 3 ? ?PAST MEDICAL HISTORY: ?Past Medical History:  ?Diagnosis Date  ? Arthritis   ? Coronary atherosclerosis of native coronary artery   ? a. NSTEMI (02/2014):  LHC (02/2014):  Mild disease in LAD and CFX; prox RCA occluded with R-R collats, dist RCA filled by L-R collats, inf HK, EF 55%, LVEDP 15 mmHg.  PCI:  Unsuccessful angioplasty of RCA (late presentation of inf MI) - tx medically.  ? Essential hypertension   ? GERD (gastroesophageal reflux disease)   ? Hyperlipidemia   ? NSTEMI (non-ST elevated myocardial infarction) ([place]) 03/11/14  ? Renal cell carcinoma of right kidney ([facility])   ? Partial nephrectomy in 2013  ? ? ?PAST  SURGICAL HISTORY: ?Past Surgical History:  ?Procedure Laterality Date  ? BACK SURGERY    ? 1989  ? COLONOSCOPY N/A 12/11/2020  ? Procedure: COLONOSCOPY;  Surgeon: Aviva Signs, MD;  Location: [facility];  Lewanda Rife

## 2022-02-16 LAB — VITAMIN B1: Thiamine: 117.5 nmol/L (ref 66.5–200.0)

## 2022-02-16 LAB — VITAMIN B12: Vitamin B-12: 273 pg/mL (ref 232–1245)

## 2022-02-16 LAB — COPPER, SERUM: Copper: 106 ug/dL (ref 69–132)

## 2022-02-17 ENCOUNTER — Telehealth: Payer: Self-pay | Admitting: *Deleted

## 2022-02-17 NOTE — Telephone Encounter (Signed)
-----   Message from Britt Bottom, MD sent at 02/17/2022  8:25 AM EDT ----- ?Please let the patient know that the lab work is fine. ? ?

## 2022-02-17 NOTE — Telephone Encounter (Signed)
Called and spoke w/ pt. Relayed results per Dr. Garth Bigness note. He asked about ESI. Advised it appears [place] LVM for him on 02/13/22 to schedule. He will call them back to schedule, nothing further needed. ?

## 2022-02-18 ENCOUNTER — Ambulatory Visit
Admission: RE | Admit: 2022-02-18 | Discharge: 2022-02-18 | Disposition: A | Discharge status: Home / Self Care | Payer: Medicare Other | Source: Ambulatory Visit | Attending: Neurology | Admitting: Neurology

## 2022-02-18 DIAGNOSIS — M5136 Other intervertebral disc degeneration, lumbar region: Secondary | ICD-10-CM

## 2022-02-18 DIAGNOSIS — R29898 Other symptoms and signs involving the musculoskeletal system: Secondary | ICD-10-CM

## 2022-02-18 MED ORDER — IOPAMIDOL (ISOVUE-M 200) INJECTION 41%
1.0000 mL | Freq: Once | INTRAMUSCULAR | Status: AC
  Administered 2022-02-18: 1 mL via EPIDURAL

## 2022-02-18 MED ORDER — METHYLPREDNISOLONE ACETATE 40 MG/ML INJ SUSP (RADIOLOG
80.0000 mg | Freq: Once | INTRAMUSCULAR | Status: AC
  Administered 2022-02-18: 80 mg via EPIDURAL

## 2022-02-18 NOTE — Discharge Instructions (Signed)

## 2022-04-26 IMAGING — XA Imaging study
2 series · 2 of 2 positions shown · non-contrast
Comparison: none

CLINICAL DATA: Lumbosacral spondylosis without myelopathy. Low back
pain. Numbness and weakness in the right greater than left legs. An
epidural injection was requested on the right at L5-S1, however the
patient has had a right-sided laminectomy at this level. Therefore,
today's injection will be performed at L4-5.

[Series 1: ortho standard · 1 of 1 slices shown (1 of 2)]
[im 1/1]
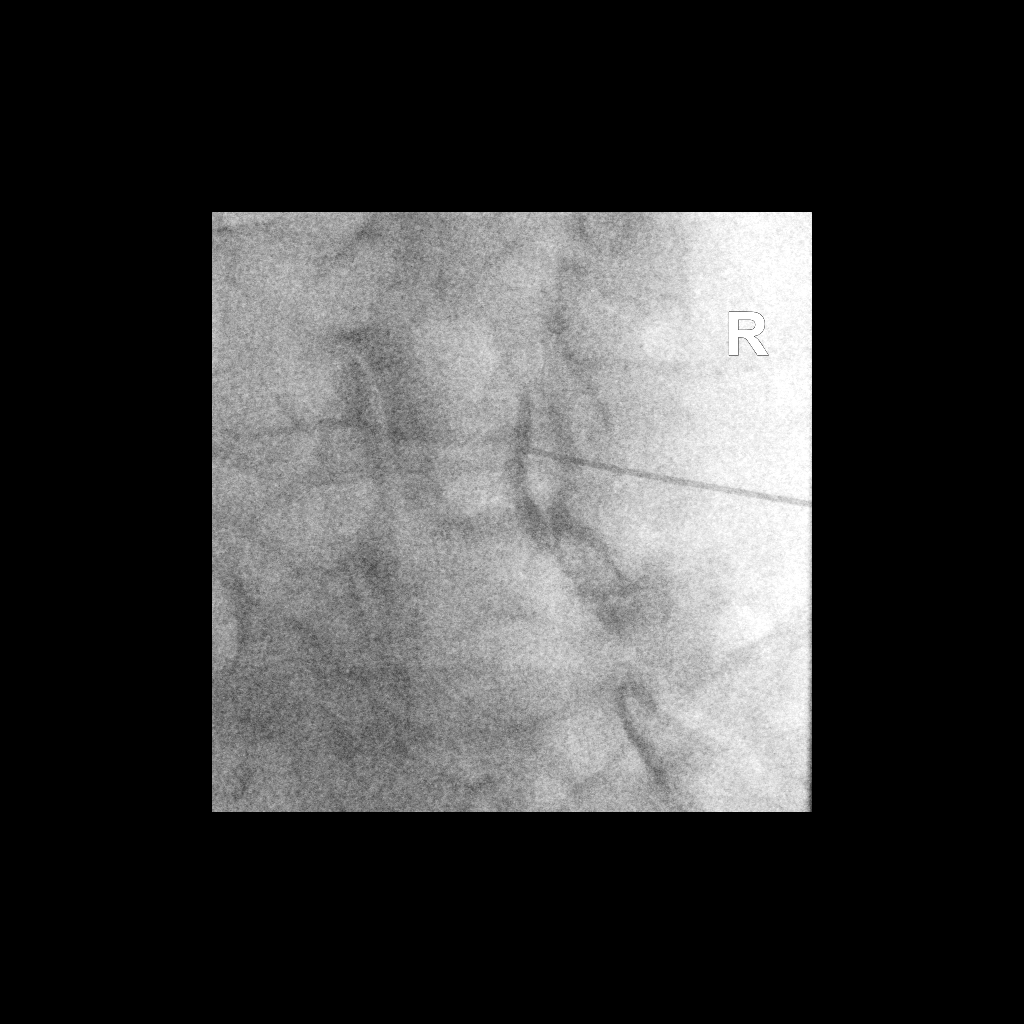

[Series 2: ortho standard · 1 of 1 slices shown (2 of 2)]
[im 1/1]
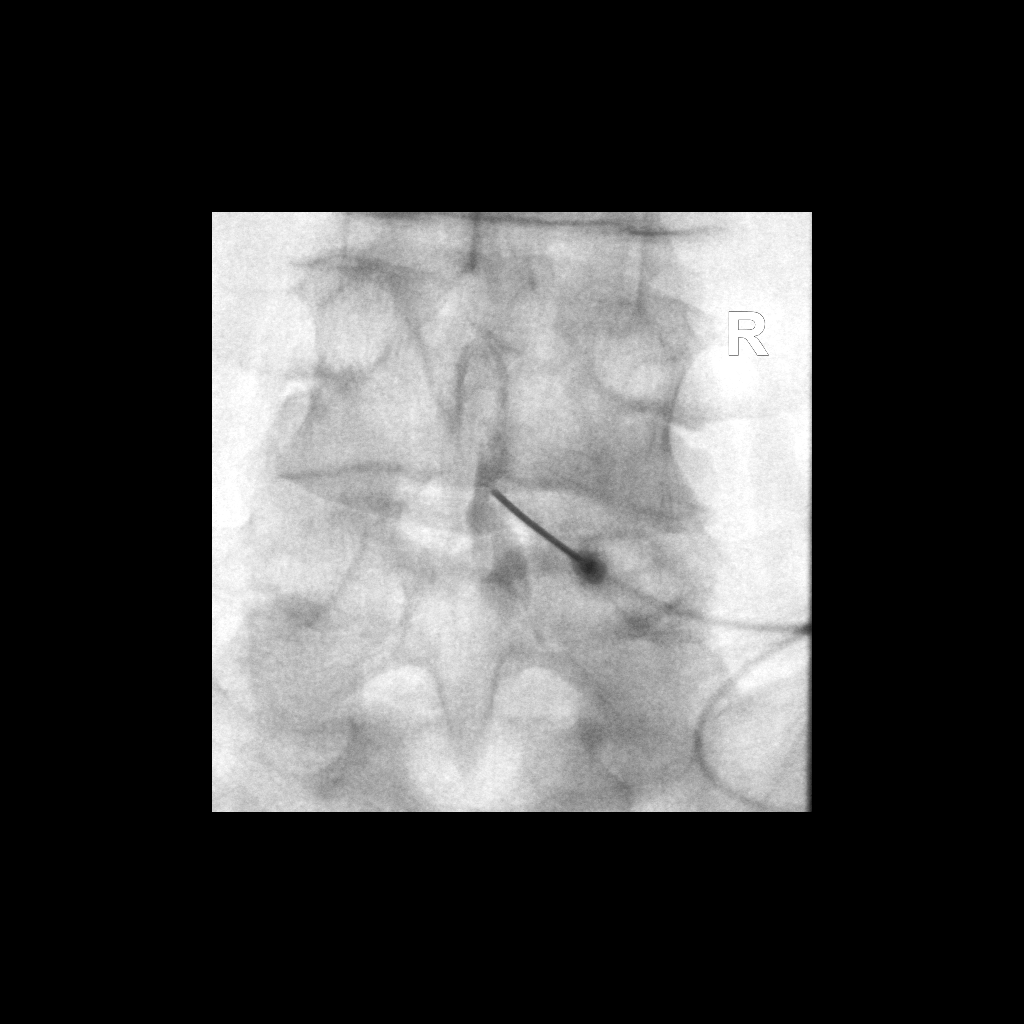

[2 of 2 positions shown; findings below may reference images not displayed]

FLUOROSCOPY:
Radiation Exposure Index (as provided by the fluoroscopic device):
2.10 mGy Kerma

PROCEDURE:
The procedure, risks, benefits, and alternatives were explained to
the patient. Questions regarding the procedure were encouraged and
answered. The patient understands and consents to the procedure.

LUMBAR EPIDURAL INJECTION:

An interlaminar approach was performed on the right at L4-5. The
overlying skin was cleansed and anesthetized. A 3.5 inch 20 gauge
epidural needle was advanced using loss-of-resistance technique.

DIAGNOSTIC EPIDURAL INJECTION:

Injection of Isovue-M 200 shows a good epidural pattern with spread
above and below the level of needle placement, primarily on the
right. No vascular opacification is seen.

THERAPEUTIC EPIDURAL INJECTION:

80 mg of Depo-Medrol mixed with 3 mL of 1% lidocaine were instilled.
The procedure was well-tolerated, and the patient was discharged
thirty minutes following the injection in good condition.

COMPLICATIONS:
None immediate
IMPRESSION: Technically successful interlaminar epidural injection on the right
at L4-5.

## 2022-07-01 ENCOUNTER — Encounter: Payer: Self-pay | Admitting: *Deleted

## 2022-07-01 ENCOUNTER — Other Ambulatory Visit: Payer: Self-pay | Admitting: *Deleted

## 2022-07-01 NOTE — Patient Outreach (Signed)
  Care Coordination   Initial Visit Note   07/01/2022 Name: [name] MRN: [id number] DOB: 1956-10-17  [name] is a 66 y.o. year old male who sees [facility] for primary care. I spoke with  [name] by phone today  What matters to the patients health and wellness today?  History of spinal stenosis, has had surgery to repair in the past.  State this has not helped, still having pain and numbness in legs as well as arms intermittently.  Also continues to have back pain.  He would like to see a specialist at [facility], will look further into doing this.  He is the caregiver of his wife with cancer, decreasing pain would help him continue caring for her.    Goals Addressed               This Visit's Progress     Relief from leg/neck/back pain (pt-stated)        Care Coordination Interventions: Discussed importance of adherence to all scheduled medical appointments Advised patient to discuss second opinion at [facility] with provider Assessed social determinant of health barriers Encouraged to call Insurance to inquire about referral process for specialists as well as ones that are covered within the network.        SDOH assessments and interventions completed:  Yes  SDOH Interventions Today    Flowsheet Row Most Recent Value  SDOH Interventions   Food Insecurity Interventions Intervention Not Indicated  Housing Interventions Intervention Not Indicated  Transportation Interventions Intervention Not Indicated        Care Coordination Interventions Activated:  Yes  Care Coordination Interventions:  Yes, provided   Follow up plan: Follow up call scheduled for 9/21    Encounter Outcome:  Pt. Visit Completed   Curtis David, RN, MSN, [place] Coordinator [phone]

## 2022-07-01 NOTE — Patient Instructions (Signed)
Visit Information  Thank you for taking time to visit with me today. Please don't hesitate to contact me if I can be of assistance to you before our next scheduled telephone appointment.  Following are the goals we discussed today:  Call insurance to inquire about spine specialists within your network and referral process.  Our next appointment is by telephone on 9/21  The patient has been provided with contact information for the care management team and has been advised to call with any health related questions or concerns.   Valente David, RN, MSN, [facility] Care Coordinator [phone]

## 2022-07-30 ENCOUNTER — Other Ambulatory Visit: Payer: Self-pay | Admitting: Orthopedic Surgery

## 2022-07-30 DIAGNOSIS — M5451 Vertebrogenic low back pain: Secondary | ICD-10-CM

## 2022-08-04 ENCOUNTER — Telehealth: Payer: Self-pay

## 2022-08-04 NOTE — Progress Notes (Signed)
Phone call to patient to discuss order for intradiscal injection. Reviewed pts allergies and most recent weight. Pt was advised not to take oral antibiotics for this procedure that we would give him IV antibiotics prior. Discharge instructions also reviewed with patient and instructed patient to have a driver the day of the procedure. Pt verbalized understanding.  

## 2022-08-07 ENCOUNTER — Ambulatory Visit: Payer: Self-pay | Admitting: *Deleted

## 2022-08-07 NOTE — Patient Outreach (Signed)
  Care Coordination   Follow Up Visit Note   08/07/2022 Name: [name] MRN: [id number] DOB: [birth year]  [name] is a 66 y.o. year old male who sees [facility] for primary care. I spoke with  Effie Berkshire by phone today.  What matters to the patients health and wellness today?  State he is now seeing  a different specialist in [place], pain is better and currently going through workup for intervention.  Denies any urgent concerns, encouraged to contact this care manager with questions.      Goals Addressed               This Visit's Progress     COMPLETED: Relief from leg/neck/back pain (pt-stated)   On track     Care Coordination Interventions: Discussed importance of adherence to all scheduled medical appointments Advised patient to discuss second opinion at [facility] with provider Assessed social determinant of health barriers Encouraged to call Insurance to inquire about referral process for specialists as well as ones that are covered within the network. Confirmed he has had second opinion, but decided not to go with [facility]        SDOH assessments and interventions completed:  No     Care Coordination Interventions Activated:  Yes  Care Coordination Interventions:  Yes, provided   Follow up plan: No further intervention required.   Encounter Outcome:  Pt. Visit Completed   Valente David, RN, MSN, [place] Care Management Care Management Coordinator [phone]

## 2022-08-13 ENCOUNTER — Ambulatory Visit
Admission: RE | Admit: 2022-08-13 | Discharge: 2022-08-13 | Disposition: A | Discharge status: Home / Self Care | Payer: Medicare Other | Source: Ambulatory Visit | Attending: Orthopedic Surgery | Admitting: Orthopedic Surgery

## 2022-08-13 ENCOUNTER — Ambulatory Visit
Admission: RE | Admit: 2022-08-13 | Discharge: 2022-08-13 | Disposition: A | Discharge status: Home / Self Care | Payer: Self-pay | Source: Ambulatory Visit | Attending: Orthopedic Surgery | Admitting: Orthopedic Surgery

## 2022-08-13 ENCOUNTER — Other Ambulatory Visit: Payer: Self-pay | Admitting: Orthopedic Surgery

## 2022-08-13 DIAGNOSIS — M5451 Vertebrogenic low back pain: Secondary | ICD-10-CM

## 2022-08-13 DIAGNOSIS — R52 Pain, unspecified: Secondary | ICD-10-CM

## 2022-08-13 MED ORDER — CEFAZOLIN SODIUM-DEXTROSE 2-4 GM/100ML-% IV SOLN
2.0000 g | INTRAVENOUS | Status: AC
  Administered 2022-08-13: 2 g via INTRAVENOUS

## 2022-08-13 NOTE — Discharge Instructions (Signed)
Intra-Discal Anesthetic Injection Discharge Instruction Sheet  You may resume a regular diet and any medications that you routinely take (unless otherwise indicated).   No driving day of procedure.  Light activity throughout the rest of the day.  Do not do any strenuous work, exercise, bending or lifting.  The day following the procedure, you can resume normal physical activity but you should refrain from exercising or physical therapy for at least three days thereafter.   Please contact our office at [phone] for the following symptoms: Fever greater than 100 degrees. Headaches unresolved with medication after 2-3 days.    YOU MAY RESUME YOUR ASPIRIN ANYTIME AFTER PROCEDURE TODAY

## 2022-09-03 ENCOUNTER — Other Ambulatory Visit: Payer: Self-pay | Admitting: Orthopedic Surgery

## 2022-09-03 DIAGNOSIS — M5451 Vertebrogenic low back pain: Secondary | ICD-10-CM

## 2022-09-04 ENCOUNTER — Telehealth: Payer: Self-pay

## 2022-09-04 NOTE — Progress Notes (Signed)
Phone call to patient to discuss order for intradiscal injection. Reviewed pts allergies and most recent weight. Pt was advised not to take oral antibiotics for this procedure that we would give him IV antibiotics prior. Discharge instructions also reviewed with patient and instructed patient to have a driver the day of the procedure. Pt verbalized understanding.  

## 2022-09-09 ENCOUNTER — Ambulatory Visit
Admission: RE | Admit: 2022-09-09 | Discharge: 2022-09-09 | Disposition: A | Discharge status: Home / Self Care | Payer: Medicare Other | Source: Ambulatory Visit | Attending: Orthopedic Surgery | Admitting: Orthopedic Surgery

## 2022-09-09 ENCOUNTER — Ambulatory Visit
Admission: RE | Admit: 2022-09-09 | Discharge: 2022-09-09 | Disposition: A | Discharge status: Home / Self Care | Payer: Self-pay | Source: Ambulatory Visit | Attending: Orthopedic Surgery | Admitting: Orthopedic Surgery

## 2022-09-09 ENCOUNTER — Other Ambulatory Visit: Payer: Self-pay | Admitting: Orthopedic Surgery

## 2022-09-09 DIAGNOSIS — M5451 Vertebrogenic low back pain: Secondary | ICD-10-CM

## 2022-09-09 MED ORDER — CEFAZOLIN SODIUM-DEXTROSE 2-4 GM/100ML-% IV SOLN
2.0000 g | INTRAVENOUS | Status: AC
  Administered 2022-09-09: 2 g via INTRAVENOUS

## 2022-09-09 MED ORDER — IOPAMIDOL (ISOVUE-M 200) INJECTION 41%
1.0000 mL | Freq: Once | INTRAMUSCULAR | Status: AC
  Administered 2022-09-09: 1 mL

## 2022-09-09 NOTE — Discharge Instructions (Signed)
Intra-Discal Anesthetic Injection Discharge Instruction Sheet  You may resume a regular diet and any medications that you routinely take (including pain medications).  No driving day of procedure.  Light activity throughout the rest of the day.  Do not do any strenuous work, exercise, bending or lifting.  The day following the procedure, you can resume normal physical activity but you should refrain from exercising or physical therapy for at least three days thereafter.   Please contact our office at [phone] for the following symptoms: Fever greater than 100 degrees. Headaches unresolved with medication after 2-3 days.    YOU MAY RESUME YOUR ASPIRIN ANYTIME AFTER PROCEDURE TODAY  

## 2022-09-10 ENCOUNTER — Ambulatory Visit: Payer: Medicare Other | Admitting: Neurology

## 2022-10-02 DIAGNOSIS — M47816 Spondylosis without myelopathy or radiculopathy, lumbar region: Secondary | ICD-10-CM | POA: Insufficient documentation

## 2022-10-02 DIAGNOSIS — M961 Postlaminectomy syndrome, not elsewhere classified: Secondary | ICD-10-CM | POA: Insufficient documentation

## 2022-10-03 ENCOUNTER — Other Ambulatory Visit: Payer: Self-pay | Admitting: Cardiology

## 2022-10-10 ENCOUNTER — Other Ambulatory Visit: Payer: Self-pay | Admitting: Cardiology

## 2022-10-15 DIAGNOSIS — M5416 Radiculopathy, lumbar region: Secondary | ICD-10-CM | POA: Insufficient documentation

## 2022-10-28 ENCOUNTER — Other Ambulatory Visit: Payer: Self-pay | Admitting: Cardiology

## 2022-11-01 ENCOUNTER — Other Ambulatory Visit: Payer: Self-pay | Admitting: Cardiology

## 2022-11-04 ENCOUNTER — Other Ambulatory Visit: Payer: Self-pay | Admitting: Cardiology

## 2022-11-07 ENCOUNTER — Ambulatory Visit: Payer: Medicare Other | Admitting: Cardiology

## 2022-11-07 NOTE — Progress Notes (Deleted)
Cardiology Office Note  Date: 11/07/2022   ID: [name], [name] 1956-04-11, MRN [id number]  PCP:  [facility]  Cardiologist:  Rozann Lesches, MD Electrophysiologist:  None   No chief complaint on file.   History of Present Illness: [name] is a 66 y.o. male last assessed in June 2022 by Ms. Strader PA-C, I reviewed the note.  Past Medical History:  Diagnosis Date   Arthritis    Coronary atherosclerosis of native coronary artery    a. NSTEMI (02/2014):  LHC (02/2014):  Mild disease in LAD and CFX; prox RCA occluded with R-R collats, dist RCA filled by L-R collats, inf HK, EF 55%, LVEDP 15 mmHg.  PCI:  Unsuccessful angioplasty of RCA (late presentation of inf MI) - tx medically.   Essential hypertension    GERD (gastroesophageal reflux disease)    Hyperlipidemia    NSTEMI (non-ST elevated myocardial infarction) ([place]) 03/11/14   Renal cell carcinoma of right kidney ([place])    Partial nephrectomy in 2013    Past Surgical History:  Procedure Laterality Date   [place]   COLONOSCOPY N/A 12/11/2020   Procedure: COLONOSCOPY;  Surgeon: Aviva Signs, MD;  [facility];  Service: Gastroenterology;  Laterality: N/A;   LEFT HEART CATHETERIZATION WITH CORONARY ANGIOGRAM N/A 03/17/2014   Procedure: LEFT HEART CATHETERIZATION WITH CORONARY ANGIOGRAM;  Surgeon: Jettie Booze, MD;  Location: [facility] CATH LAB;  Service: Cardiovascular;  Laterality: N/A;   POLYPECTOMY  12/11/2020   Procedure: POLYPECTOMY;  Surgeon: Aviva Signs, MD;  [facility];  Service: Gastroenterology;;   ROBOT ASSISTED LAPAROSCOPIC NEPHRECTOMY  01/14/2012   Procedure: ROBOTIC ASSISTED LAPAROSCOPIC NEPHRECTOMY;  Surgeon: Molli Hazard, MD;  Location: [facility];  Service: Urology;  Laterality: Right;  Robot Laparoscopic Right Partial Nephrectomy     TONSILLECTOMY      Current Outpatient Medications  Medication Sig Dispense Refill   aspirin EC 81 MG EC  tablet Take 1 tablet (81 mg total) by mouth daily.     atorvastatin (LIPITOR) 80 MG tablet TAKE 1 TABLET ONCE DAILY. 30 tablet 0   baclofen (LIORESAL) 10 MG tablet Take 1 tablet (10 mg total) by mouth 3 (three) times daily. 90 each 5   finasteride (PROSCAR) 5 MG tablet Take 5 mg by mouth daily.  3   losartan (COZAAR) 100 MG tablet TAKE 1 TABLET ONCE DAILY. 7 tablet 0   metoprolol succinate (TOPROL-XL) 25 MG 24 hr tablet TAKE 1 TABLET ONCE DAILY. 15 tablet 0   No current facility-administered medications for this visit.   Allergies:  Alcohol and Chlorthalidone   Social History: The patient  reports that he quit smoking about 43 years ago. His smoking use included cigarettes. He has never used smokeless tobacco. He reports current drug use. Drug: Marijuana. He reports that he does not drink alcohol.   Family History: The patient's family history includes Hypertension in his daughter; Renal Disease in his daughter.   ROS:  Please see the history of present illness. Otherwise, complete review of systems is positive for {NONE DEFAULTED:18576}.  All other systems are reviewed and negative.   Physical Exam: VS:  There were no vitals taken for this visit., BMI There is no height or weight on file to calculate BMI.  Wt Readings from Last 3 Encounters:  09/04/22 175 lb (79.4 kg)  08/04/22 175 lb (79.4 kg)  02/13/22 175 lb (79.4 kg)    General: Patient appears  comfortable at rest. HEENT: Conjunctiva and lids normal, oropharynx clear with moist mucosa. Neck: Supple, no elevated JVP or carotid bruits, no thyromegaly. Lungs: Clear to auscultation, nonlabored breathing at rest. Cardiac: Regular rate and rhythm, no S3 or significant systolic murmur, no pericardial rub. Abdomen: Soft, nontender, no hepatomegaly, bowel sounds present, no guarding or rebound. Extremities: No pitting edema, distal pulses 2+. Skin: Warm and dry. Musculoskeletal: No kyphosis. Neuropsychiatric: Alert and oriented x3,  affect grossly appropriate.  ECG:  An ECG dated 09/12/2020 was personally reviewed today and demonstrated:  Sinus rhythm with nonspecific T wave changes, possible old inferior infarct.  Recent Labwork:  November 2021: Cholesterol 119, triglycerides 90, HDL 43, LDL 59, BUN 18, creatinine 0.94, potassium 4.8, AST 17, ALT 18, hemoglobin 14.9, platelets 238  Other Studies Reviewed Today:  No interval cardiac testing for review today.  Assessment and Plan:   Medication Adjustments/Labs and Tests Ordered: Current medicines are reviewed at length with the patient today.  Concerns regarding medicines are outlined above.   Tests Ordered: No orders of the defined types were placed in this encounter.   Medication Changes: No orders of the defined types were placed in this encounter.   Disposition:  Follow up {follow up:15908}  Signed, Satira Sark, MD, [facility] 11/07/2022 8:18 AM    [facility] [address], [place], [place] [address] Phone: [phone]; Fax: [phone]

## 2022-11-12 ENCOUNTER — Encounter: Payer: Self-pay | Admitting: Nurse Practitioner

## 2022-11-12 ENCOUNTER — Ambulatory Visit: Payer: Medicare Other | Attending: Cardiology | Admitting: Nurse Practitioner

## 2022-11-12 VITALS — BP 136/82 | HR 74 | Ht 68.0 in | Wt 173.0 lb

## 2022-11-12 DIAGNOSIS — I2510 Atherosclerotic heart disease of native coronary artery without angina pectoris: Secondary | ICD-10-CM | POA: Insufficient documentation

## 2022-11-12 DIAGNOSIS — F1910 Other psychoactive substance abuse, uncomplicated: Secondary | ICD-10-CM | POA: Diagnosis present

## 2022-11-12 DIAGNOSIS — R011 Cardiac murmur, unspecified: Secondary | ICD-10-CM | POA: Insufficient documentation

## 2022-11-12 DIAGNOSIS — R29898 Other symptoms and signs involving the musculoskeletal system: Secondary | ICD-10-CM | POA: Diagnosis present

## 2022-11-12 DIAGNOSIS — R053 Chronic cough: Secondary | ICD-10-CM | POA: Diagnosis present

## 2022-11-12 DIAGNOSIS — I25119 Atherosclerotic heart disease of native coronary artery with unspecified angina pectoris: Secondary | ICD-10-CM

## 2022-11-12 DIAGNOSIS — I471 Supraventricular tachycardia, unspecified: Secondary | ICD-10-CM | POA: Diagnosis present

## 2022-11-12 DIAGNOSIS — R202 Paresthesia of skin: Secondary | ICD-10-CM | POA: Insufficient documentation

## 2022-11-12 DIAGNOSIS — F439 Reaction to severe stress, unspecified: Secondary | ICD-10-CM | POA: Diagnosis present

## 2022-11-12 DIAGNOSIS — E785 Hyperlipidemia, unspecified: Secondary | ICD-10-CM | POA: Diagnosis present

## 2022-11-12 DIAGNOSIS — I10 Essential (primary) hypertension: Secondary | ICD-10-CM | POA: Insufficient documentation

## 2022-11-12 DIAGNOSIS — Z79899 Other long term (current) drug therapy: Secondary | ICD-10-CM | POA: Diagnosis present

## 2022-11-12 DIAGNOSIS — Z85528 Personal history of other malignant neoplasm of kidney: Secondary | ICD-10-CM | POA: Insufficient documentation

## 2022-11-12 MED ORDER — LOSARTAN POTASSIUM 100 MG PO TABS: 100.0000 mg | ORAL_TABLET | Freq: Every day | ORAL | 1 refills | Status: DC

## 2022-11-12 MED ORDER — METOPROLOL SUCCINATE ER 25 MG PO TB24: ORAL_TABLET | ORAL | 1 refills | Status: DC

## 2022-11-12 MED ORDER — ATORVASTATIN CALCIUM 80 MG PO TABS: 80.0000 mg | ORAL_TABLET | Freq: Every day | ORAL | 1 refills | Status: DC

## 2022-11-12 NOTE — Progress Notes (Signed)
Cardiology Office Note:    Date:  11/12/2022   ID:  Jantzen, Pilger [birth year], MRN [id number]  PCP:  [facility] Cardiologist:  Rozann Lesches, MD     Referring MD: [facility]   CC: Here for follow-up  History of Present Illness:    [name] is a 66 y.o. male with a hx of the following:  CAD, s/p NSTEMI in 2015 HTN PSVT HLD Hx of RCC, s/p partial right nephrectomy in 2013  Patient is a very delightful 66 year old male with past medical history as mentioned above.    He had an NSTEMI in 2015 and underwent cardiac catheterization that showed occlusion of RCA filled by collaterals and had unsuccessful PCI, medical management was recommended.  History of renal cell carcinoma, status post partial nephrectomy in 2013.   Last seen by Bernerd Pho, PA-C on September 12, 2020.  Chief complaint was bilateral leg weakness, worse with activity.  Did note experiencing associated paresthesias.  Denied any chest pain or shortness of breath on exertion.  Denied any other associated symptoms.  It was recommended to increase losartan to 100 mg daily due to elevated blood pressures.  A referral to orthopedics was discussed for weakness along lower extremities, however patient decided to hold off at the time. Was recommended to follow-up in 6 months.  Today he presents for overdue follow-up.  He states he is doing well. Continues to note chronic left arm numbness, worse with laying down at night on his left side. Notes difficulty with his legs and back. Says he has seen neurology and orthopedics and is going to go for a third opinion. Does note chronic stress. Sadly, his daughter and mother passed away within 100 days of one another. Denies any chest pain, but does report nerve pain along upper left chest, says it feels like "fluttering." Does note chronic, congested cough and says he has been having URI symptoms for a while.  Denies current tobacco use, former tobacco smoker. Does admit to smoking weed. Says he was placed on Z-pack, didn't seem to help. Denies any shortness of breath, palpitations, syncope, presyncope, dizziness, orthopnea, PND, swelling, significant weight changes, acute bleeding, or claudication.  States he does not check his BP often as this causes anxiety.  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   Past Medical History:  Diagnosis Date   Arthritis    Coronary atherosclerosis of native coronary artery    a. NSTEMI (02/2014):  LHC (02/2014):  Mild disease in LAD and CFX; prox RCA occluded with R-R collats, dist RCA filled by L-R collats, inf HK, EF 55%, LVEDP 15 mmHg.  PCI:  Unsuccessful angioplasty of RCA (late presentation of inf MI) - tx medically.   Essential hypertension    GERD (gastroesophageal reflux disease)    Hyperlipidemia    NSTEMI (non-ST elevated myocardial infarction) ([place]) 03/11/14   Renal cell carcinoma of right kidney ([place])    Partial nephrectomy in 2013    Past Surgical History:  Procedure Laterality Date   [place]  04/29/2021   COLONOSCOPY N/A 12/11/2020   Procedure: COLONOSCOPY;  Surgeon: Aviva Signs, MD;  [facility];  Service: Gastroenterology;  Laterality: N/A;   LEFT HEART CATHETERIZATION WITH CORONARY ANGIOGRAM N/A 03/17/2014   Procedure: LEFT HEART CATHETERIZATION WITH CORONARY ANGIOGRAM;  Surgeon: Jettie Booze, MD;  Location: [facility] CATH  LAB;  Service: Cardiovascular;  Laterality: N/A;   POLYPECTOMY  12/11/2020   Procedure: POLYPECTOMY;  Surgeon: Aviva Signs, MD;  [facility];  Service: Gastroenterology;;   ROBOT ASSISTED LAPAROSCOPIC NEPHRECTOMY  01/14/2012   Procedure: ROBOTIC ASSISTED LAPAROSCOPIC NEPHRECTOMY;  Surgeon: Molli Hazard, MD;  [facility];  Service: Urology;  Laterality: Right;  Robot Laparoscopic Right Partial Nephrectomy     TONSILLECTOMY      Current  Medications: Current Meds  Medication Sig   aspirin EC 81 MG EC tablet Take 1 tablet (81 mg total) by mouth daily.   baclofen (LIORESAL) 10 MG tablet Take 1 tablet (10 mg total) by mouth 3 (three) times daily.   finasteride (PROSCAR) 5 MG tablet Take 5 mg by mouth daily.   [DISCONTINUED] atorvastatin (LIPITOR) 80 MG tablet TAKE 1 TABLET ONCE DAILY.   [DISCONTINUED] losartan (COZAAR) 100 MG tablet TAKE 1 TABLET ONCE DAILY.   [DISCONTINUED] metoprolol succinate (TOPROL-XL) 25 MG 24 hr tablet TAKE 1 TABLET ONCE DAILY.     Allergies:   Alcohol and Chlorthalidone   Social History   Socioeconomic History   Marital status: Married    Spouse name: Not on file   Number of children: 1   Years of education: 14   Highest education level: Not on file  Occupational History   Occupation: Retired   Tobacco Use   Smoking status: Former    Types: Cigarettes    Quit date: 11/18/1979    Years since quitting: 43.0   Smokeless tobacco: Never  Vaping Use   Vaping Use: Never used  Substance and Sexual Activity   Alcohol use: No    Alcohol/week: 0.0 standard drinks of alcohol   Drug use: Yes    Types: Marijuana   Sexual activity: Not on file  Other Topics Concern   Not on file  Social History Narrative   Lives w/ wife   Caffeine use: 1 cup coffee every morning   Right handed   Social Determinants of Health   Financial Resource Strain: Not on file  Food Insecurity: No Food Insecurity (07/01/2022)   Hunger Vital Sign    Worried About Running Out of Food in the Last Year: Never true    [place] in the Last Year: Never true  Transportation Needs: No Transportation Needs (07/01/2022)   PRAPARE - Hydrologist (Medical): No    Lack of Transportation (Non-Medical): No  Physical Activity: Not on file  Stress: Not on file  Social Connections: Not on file     Family History: The patient's family history includes Hypertension in his daughter; Renal Disease in  his daughter.  ROS:   Review of Systems  Constitutional: Negative.   HENT: Negative.    Eyes: Negative.   Respiratory:  Positive for cough. Negative for hemoptysis, sputum production, shortness of breath and wheezing.        See HPI.   Cardiovascular: Negative.   Gastrointestinal: Negative.   Genitourinary: Negative.   Musculoskeletal:  Positive for back pain. Negative for falls, joint pain, myalgias and neck pain.       See HPI.   Skin: Negative.   Neurological:  Positive for tingling, sensory change and focal weakness. Negative for dizziness, tremors, speech change, seizures, loss of consciousness, weakness and headaches.       See  HPI.   Endo/Heme/Allergies: Negative.   Psychiatric/Behavioral:  Positive for substance abuse. Negative for depression, hallucinations, memory loss and  suicidal ideas. The patient is nervous/anxious. The patient does not have insomnia.        See HPI.     Please see the history of present illness.    All other systems reviewed and are negative.  EKGs/Labs/Other Studies Reviewed:    The following studies were reviewed today:   EKG:  EKG is ordered today.  The ekg ordered today demonstrates sinus rhythm, 74 bpm, rare PACs, otherwise nothing acute.   Recent Labs: No results found for requested labs within last 365 days.  Recent Lipid Panel    Component Value Date/Time   CHOL 119 09/20/2020 0820   TRIG 90 09/20/2020 0820   HDL 43 09/20/2020 0820   CHOLHDL 2.8 09/20/2020 0820   VLDL 25 05/18/2017 0913   LDLCALC 59 09/20/2020 0820    Physical Exam:    VS:  BP 136/82   Pulse 74   Ht '5\' 8"'$  (1.727 m)   Wt 173 lb (78.5 kg)   SpO2 100%   BMI 26.30 kg/m     Wt Readings from Last 3 Encounters:  11/12/22 173 lb (78.5 kg)  09/04/22 175 lb (79.4 kg)  08/04/22 175 lb (79.4 kg)     GEN: Well nourished, well developed in no acute distress HEENT: Normal NECK: No JVD; No carotid bruits CARDIAC: S1/S2, RRR, Grade 1/6 systolic murmur noted  along LSB, no rubs or gallops noted; 2+ peripheral pulses throughout, strong and equal bilaterally RESPIRATORY:  Clear to auscultation without rales, wheezing or rhonchi; moist, congested cough, nonproductive noted on exam MUSCULOSKELETAL:  No edema; No deformity  SKIN: Warm and dry, bilateral, minimal skin discoloration along lower extremities NEUROLOGIC:  Alert and oriented x 3 PSYCHIATRIC:  Normal affect   ASSESSMENT:    1. Coronary artery disease involving native heart without angina pectoris, unspecified vessel or lesion type   2. Hyperlipidemia, unspecified hyperlipidemia type   3. Medication management   4. Hypertension, unspecified type   5. PSVT (paroxysmal supraventricular tachycardia)   6. History of renal cell carcinoma   7. Paresthesia of arm   8. Weakness of both legs   9. Murmur   10. Substance abuse ([place])   11. Chronic cough   12. Stress    PLAN:    In order of problems listed above:  CAD, status post NSTEMI in 2015 Denies any anginal symptoms, however does admit to chronic left arm numbness, does not appear to be cardiac related. Notes left upper chest, near left shoulder "fluttering," attributes this to nerve pain. Will update 2D echo as mentioned below to assess for any WMA's.  If WMA's are present, plan to further discuss ischemic evaluation at next follow-up visit.  Continue aspirin, Lipitor, losartan, and Toprol-XL. Heart healthy diet and regular cardiovascular exercise encouraged.  ED precautions discussed. Obtaining CMET, FLP, and CBC as he is due for labs.   Hyperlipidemia, medication management No recent lipid file obtained.  He is due for labs to be drawn.  Will obtain FLP and CMET.  Continue atorvastatin. Heart healthy diet and regular cardiovascular exercise encouraged.   Hypertension Blood pressure today stable.  He does not check BP at home as this causes stress and anxiety.  Discussed to check BP around 1-2 times per week, and to notify office if SBP  is greater than 140.  Continue losartan and Toprol-XL.  He is due for labs.  Will obtain CMET as mentioned above. Heart healthy diet and regular cardiovascular exercise encouraged.   PSVT Denies any  palpitations.  Continue Toprol-XL. Heart healthy diet and regular cardiovascular exercise encouraged.   History of RCC, status post partial right nephrectomy in 2013 Denies any issues.  He follows up with nephrology annually.  Continue current medication regimen.  Continue to follow-up with nephrology.  Will obtain CMET as mentioned above.  Left arm numbness/tingling, weakness of lower extremities This is chronic for him.  He has seen neurology and orthopedics without any answers.  He states he will be going for a third opinion.  Does not sound orthopedic/neurologic in nature, does not sound cardiac.  Had normal ABI examination of lower extremities in June 2023.  Does have good pedal pulses on exam.  Continue current medication regimen.  If wall motion abnormalities noted on echocardiogram as mentioned above, plan to discuss ischemic evaluation at next follow-up visit; however I have a low suspicion at this time that this is related to CAD. Heart healthy diet and regular cardiovascular exercise encouraged.   Murmur Grade 1/6 systolic murmur noted along left sternal border on exam.  Last echocardiogram was in 2014.  Asymptomatic. Will update Echo at this time as mentioned above.  Continue current medication regimen. Heart healthy diet and regular cardiovascular exercise encouraged.   Substance abuse, chronic cough I congratulated him that he stopped smoking many years ago.  Discussed and encouraged cessation of marijuana use.  He verbalized understanding.  Does note chronic cough for the past year, and will refer to pulmonology at this time. Heart healthy diet and regular cardiovascular exercise encouraged.   9. Stress Denies any abuse, SI, or HI. I offered resources to him via LCSW referral and he  politely declined. Follow with PCP. Heart healthy diet and regular cardiovascular exercise encouraged.   10. Disposition: Follow-up with me in 2 months or sooner if anything changes.   Medication Adjustments/Labs and Tests Ordered: Current medicines are reviewed at length with the patient today.  Concerns regarding medicines are outlined above.  Orders Placed This Encounter  Procedures   CBC   Comprehensive metabolic panel   Lipid Profile   Ambulatory referral to Pulmonology   EKG 12-Lead   ECHOCARDIOGRAM COMPLETE   Meds ordered this encounter  Medications   losartan (COZAAR) 100 MG tablet    Sig: Take 1 tablet (100 mg total) by mouth daily.    Dispense:  90 tablet    Refill:  1   metoprolol succinate (TOPROL-XL) 25 MG 24 hr tablet    Sig: TAKE 1 TABLET ONCE DAILY.    Dispense:  90 tablet    Refill:  1    Please schedule an appointment for further refills.   atorvastatin (LIPITOR) 80 MG tablet    Sig: Take 1 tablet (80 mg total) by mouth daily.    Dispense:  90 tablet    Refill:  1    NEEDS Apt for refills    Patient Instructions  Medication Instructions:  Your physician recommends that you continue on your current medications as directed. Please refer to the Current Medication list given to you today.   Labwork: CBC,CMET,Lipids  Testing/Procedures: Your physician has requested that you have an echocardiogram. Echocardiography is a painless test that uses sound waves to create images of your heart. It provides your doctor with information about the size and shape of your heart and how well your heart's chambers and valves are working. This procedure takes approximately one hour. There are no restrictions for this procedure. Please do NOT wear cologne, perfume, aftershave, or lotions (deodorant is  allowed). Please arrive 15 minutes prior to your appointment time.   Follow-Up: 8 weeks  Any Other Special Instructions Will Be Listed Below (If Applicable).  You have  been referred to pulmonary   If you need a refill on your cardiac medications before your next appointment, please call your pharmacy.    SignedFinis Bud, NP  11/12/2022 12:03 PM    [place]

## 2022-11-12 NOTE — Patient Instructions (Signed)
Medication Instructions:  Your physician recommends that you continue on your current medications as directed. Please refer to the Current Medication list given to you today.   Labwork: CBC,CMET,Lipids  Testing/Procedures: Your physician has requested that you have an echocardiogram. Echocardiography is a painless test that uses sound waves to create images of your heart. It provides your doctor with information about the size and shape of your heart and how well your heart's chambers and valves are working. This procedure takes approximately one hour. There are no restrictions for this procedure. Please do NOT wear cologne, perfume, aftershave, or lotions (deodorant is allowed). Please arrive 15 minutes prior to your appointment time.   Follow-Up: 8 weeks  Any Other Special Instructions Will Be Listed Below (If Applicable).  You have been referred to pulmonary   If you need a refill on your cardiac medications before your next appointment, please call your pharmacy.

## 2022-11-14 ENCOUNTER — Ambulatory Visit: Payer: Medicare Other | Attending: Nurse Practitioner

## 2022-11-14 DIAGNOSIS — R011 Cardiac murmur, unspecified: Secondary | ICD-10-CM | POA: Diagnosis not present

## 2022-11-18 LAB — ECHOCARDIOGRAM COMPLETE
AR max vel: 0.74 cm2
AV Area VTI: 0.74 cm2
AV Area mean vel: 0.76 cm2
AV Mean grad: 43 mmHg
AV Peak grad: 66 mmHg
Ao pk vel: 4.06 m/s
Area-P 1/2: 2.39 cm2
Calc EF: 62.7 %
MV M vel: 3.98 m/s
MV Peak grad: 63.4 mmHg
S' Lateral: 3.35 cm
Single Plane A2C EF: 61.8 %
Single Plane A4C EF: 61.5 %

## 2022-11-28 ENCOUNTER — Institutional Professional Consult (permissible substitution) (HOSPITAL_BASED_OUTPATIENT_CLINIC_OR_DEPARTMENT_OTHER): Payer: Medicare Other | Admitting: Pulmonary Disease

## 2022-12-02 ENCOUNTER — Other Ambulatory Visit: Payer: Self-pay | Admitting: Cardiology

## 2022-12-03 ENCOUNTER — Telehealth: Payer: Self-pay | Admitting: *Deleted

## 2022-12-03 DIAGNOSIS — I77819 Aortic ectasia, unspecified site: Secondary | ICD-10-CM

## 2022-12-03 NOTE — Telephone Encounter (Signed)
Patient informed and verbalized understanding of plan. Aware to get lab work done soon Copy sent to PCP

## 2022-12-03 NOTE — Telephone Encounter (Signed)
-----  Message from Satira Sark, MD sent at 11/28/2022  5:05 PM EST ----- Results reviewed.  Patient seen recently by Ms. Arlington Calix NP, I reviewed the note.  Echocardiogram shows preserved LVEF of 55 to 60%.  He has what looks to be a bicuspid aortic valve on my review of the images and also severe aortic stenosis based on mean gradient and dimensionless index.  This has progressed substantially compared to remote echocardiogram.  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  Big question now is whether he is having any exertional symptomatology that would suggest cardiac catheterization as a next step.  I would go ahead and get a chest CTA to investigate the ascending aorta to better define aneurysmal dilatation.

## 2022-12-05 LAB — CBC
Hematocrit: 44.8 % (ref 37.5–51.0)
Hemoglobin: 15.2 g/dL (ref 13.0–17.7)
MCH: 30.7 pg (ref 26.6–33.0)
MCHC: 33.9 g/dL (ref 31.5–35.7)
MCV: 91 fL (ref 79–97)
Platelets: 225 10*3/uL (ref 150–450)
RBC: 4.95 x10E6/uL (ref 4.14–5.80)
RDW: 13 % (ref 11.6–15.4)
WBC: 6.1 10*3/uL (ref 3.4–10.8)

## 2022-12-05 LAB — COMPREHENSIVE METABOLIC PANEL
ALT: 21 IU/L (ref 0–44)
AST: 17 IU/L (ref 0–40)
Albumin/Globulin Ratio: 2 (ref 1.2–2.2)
Albumin: 4.5 g/dL (ref 3.9–4.9)
Alkaline Phosphatase: 94 IU/L (ref 44–121)
BUN/Creatinine Ratio: 14 (ref 10–24)
BUN: 14 mg/dL (ref 8–27)
Bilirubin Total: 0.7 mg/dL (ref 0.0–1.2)
CO2: 23 mmol/L (ref 20–29)
Calcium: 9.2 mg/dL (ref 8.6–10.2)
Chloride: 103 mmol/L (ref 96–106)
Creatinine, Ser: 1.03 mg/dL (ref 0.76–1.27)
Globulin, Total: 2.2 g/dL (ref 1.5–4.5)
Glucose: 103 mg/dL — ABNORMAL HIGH (ref 70–99)
Potassium: 5.2 mmol/L (ref 3.5–5.2)
Sodium: 141 mmol/L (ref 134–144)
Total Protein: 6.7 g/dL (ref 6.0–8.5)
eGFR: 80 mL/min/{1.73_m2} (ref >59)

## 2022-12-05 LAB — LIPID PANEL
Chol/HDL Ratio: 2.9 ratio (ref 0.0–5.0)
Cholesterol, Total: 139 mg/dL (ref 100–199)
HDL: 48 mg/dL (ref >39)
LDL Chol Calc (NIH): 75 mg/dL (ref 0–99)
Triglycerides: 80 mg/dL (ref 0–149)
VLDL Cholesterol Cal: 16 mg/dL (ref 5–40)

## 2022-12-18 ENCOUNTER — Institutional Professional Consult (permissible substitution) (HOSPITAL_BASED_OUTPATIENT_CLINIC_OR_DEPARTMENT_OTHER): Payer: Medicare Other | Admitting: Pulmonary Disease

## 2023-01-08 ENCOUNTER — Ambulatory Visit: Payer: Medicare Other | Attending: Nurse Practitioner | Admitting: Nurse Practitioner

## 2023-01-08 ENCOUNTER — Encounter: Payer: Self-pay | Admitting: Nurse Practitioner

## 2023-01-08 VITALS — BP 120/80 | HR 68 | Ht 68.0 in | Wt 174.0 lb

## 2023-01-08 DIAGNOSIS — I7121 Aneurysm of the ascending aorta, without rupture: Secondary | ICD-10-CM | POA: Insufficient documentation

## 2023-01-08 DIAGNOSIS — Q231 Congenital insufficiency of aortic valve: Secondary | ICD-10-CM | POA: Diagnosis present

## 2023-01-08 DIAGNOSIS — I471 Supraventricular tachycardia, unspecified: Secondary | ICD-10-CM | POA: Diagnosis present

## 2023-01-08 DIAGNOSIS — I10 Essential (primary) hypertension: Secondary | ICD-10-CM | POA: Diagnosis present

## 2023-01-08 DIAGNOSIS — Z905 Acquired absence of kidney: Secondary | ICD-10-CM | POA: Diagnosis present

## 2023-01-08 DIAGNOSIS — I2510 Atherosclerotic heart disease of native coronary artery without angina pectoris: Secondary | ICD-10-CM | POA: Diagnosis not present

## 2023-01-08 DIAGNOSIS — R29898 Other symptoms and signs involving the musculoskeletal system: Secondary | ICD-10-CM | POA: Insufficient documentation

## 2023-01-08 DIAGNOSIS — R202 Paresthesia of skin: Secondary | ICD-10-CM | POA: Diagnosis present

## 2023-01-08 DIAGNOSIS — I77819 Aortic ectasia, unspecified site: Secondary | ICD-10-CM | POA: Diagnosis present

## 2023-01-08 DIAGNOSIS — E785 Hyperlipidemia, unspecified: Secondary | ICD-10-CM | POA: Diagnosis present

## 2023-01-08 DIAGNOSIS — I350 Nonrheumatic aortic (valve) stenosis: Secondary | ICD-10-CM | POA: Diagnosis present

## 2023-01-08 DIAGNOSIS — Z85528 Personal history of other malignant neoplasm of kidney: Secondary | ICD-10-CM | POA: Diagnosis present

## 2023-01-08 DIAGNOSIS — R011 Cardiac murmur, unspecified: Secondary | ICD-10-CM | POA: Diagnosis present

## 2023-01-08 NOTE — Progress Notes (Signed)
Cardiology Office Note:    Date:  01/08/2023   ID:  [name], [name] 1956/02/14, MRN [id number]  PCP:  [facility] Cardiologist:  Rozann Lesches, MD     Referring MD: [facility]   CC: Here for follow-up  History of Present Illness:    [name] is a 67 y.o. male with a hx of the following:  Bicuspid aortic valve, severe AS Ascending aortic aneurysm, aortic dilatation CAD, s/p NSTEMI in 2015 HTN PSVT HLD Hx of RCC, s/p partial right nephrectomy in 2013  Patient is a very delightful 67 year old male with past medical history as mentioned above.    He had an NSTEMI in 2015 and underwent cardiac catheterization that showed occlusion of RCA filled by collaterals and had unsuccessful PCI, medical management was recommended.  History of renal cell carcinoma, status post partial nephrectomy in 2013.   Last seen by Bernerd Pho, PA-C on September 12, 2020.  Chief complaint was bilateral leg weakness, worse with activity.  Did note experiencing associated paresthesias.  Denied any chest pain or shortness of breath on exertion.  Denied any other associated symptoms.  It was recommended to increase losartan to 100 mg daily due to elevated blood pressures.  A referral to orthopedics was discussed for weakness along lower extremities, however patient decided to hold off at the time. Was recommended to follow-up in 6 months.  11/12/2022 -  Noted chronic left arm numbness, worse with laying down at night on his left side. Noted several MSK issues. Previously followed neurology and orthopedics and was going to go for a third opinion. Noted chronic stress.  Denied CP, but reported nerve pain along upper left chest, says it feels like "fluttering." Was referred to pulmonology for chronic cough. Echocardiogram was arranged and revealed EF 55-60%, no RWMA, mild LVH, grade 1 DD, trivial MR. Dr. Domenic Polite who reviewed images stated  appeared to be bicuspid aortic valve with severe aortic stenosis based on mean gradient and dimensionless index. Ascending aorta was found to be moderately dilated at 48 mm with mild dilatation of aortic root at 42 mm. Dr. Domenic Polite recommended to arrange a chest CTA and to consider cardiac cath as next step if he were having exertional symptomatology.   01/08/2022 - Here for follow-up. Doing the same. Denies any chest pain, but admits to exertional fatigue and low energy. Denies any chest pain, shortness of breath, palpitations, syncope, presyncope, dizziness, orthopnea, PND, swelling or significant weight changes, acute bleeding, or claudication. Tolerating his medications well. Reviewed Echocardiogram results and upcoming chest CTA scheduled for next Monday.   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   Past Medical History:  Diagnosis Date   Arthritis    Coronary atherosclerosis of native coronary artery    a. NSTEMI (02/2014):  LHC (02/2014):  Mild disease in LAD and CFX; prox RCA occluded with R-R collats, dist RCA filled by L-R collats, inf HK, EF 55%, LVEDP 15 mmHg.  PCI:  Unsuccessful angioplasty of RCA (late presentation of inf MI) - tx medically.   Essential hypertension    GERD (gastroesophageal reflux disease)    Hyperlipidemia    NSTEMI (non-ST elevated myocardial infarction) ([place]) 03/11/14   Renal cell carcinoma of right kidney ([place])    Partial nephrectomy in 2013    Past Surgical History:  Procedure Laterality Date   [place]  SURGERY  04/29/2021   COLONOSCOPY N/A 12/11/2020   Procedure: COLONOSCOPY;  Surgeon: Aviva Signs, MD;  Location: [facility];  Service: Gastroenterology;  Laterality: N/A;   LEFT HEART CATHETERIZATION WITH CORONARY ANGIOGRAM N/A 03/17/2014   Procedure: LEFT HEART CATHETERIZATION WITH CORONARY ANGIOGRAM;  Surgeon: Jettie Booze, MD;  Location: [facility] CATH LAB;  Service: Cardiovascular;  Laterality: N/A;    POLYPECTOMY  12/11/2020   Procedure: POLYPECTOMY;  Surgeon: Aviva Signs, MD;  Location: [facility];  Service: Gastroenterology;;   ROBOT ASSISTED LAPAROSCOPIC NEPHRECTOMY  01/14/2012   Procedure: ROBOTIC ASSISTED LAPAROSCOPIC NEPHRECTOMY;  Surgeon: Molli Hazard, MD;  Location: [facility] OR[facility];  Service: Urology;  Laterality: Right;  Robot Laparoscopic Right Partial Nephrectomy     TONSILLECTOMY      Current Medications: Current Meds  Medication Sig   atorvastatin (LIPITOR) 80 MG tablet TAKE 1 TABLET ONCE DAILY.   baclofen (LIORESAL) 10 MG tablet Take 1 tablet (10 mg total) by mouth 3 (three) times daily.   finasteride (PROSCAR) 5 MG tablet Take 5 mg by mouth daily.   losartan (COZAAR) 100 MG tablet Take 1 tablet (100 mg total) by mouth daily.   metoprolol succinate (TOPROL-XL) 25 MG 24 hr tablet TAKE 1 TABLET ONCE DAILY.     Allergies:   Alcohol and Chlorthalidone   Social History   Socioeconomic History   Marital status: Married    Spouse name: Not on file   Number of children: 1   Years of education: 14   Highest education level: Not on file  Occupational History   Occupation: Retired   Tobacco Use   Smoking status: Former    Types: Cigarettes    Quit date: 11/18/1979    Years since quitting: 43.1   Smokeless tobacco: Never  Vaping Use   Vaping Use: Never used  Substance and Sexual Activity   Alcohol use: No    Alcohol/week: 0.0 standard drinks of alcohol   Drug use: Yes    Types: Marijuana   Sexual activity: Not on file  Other Topics Concern   Not on file  Social History Narrative   Lives w/ wife   Caffeine use: 1 cup coffee every morning   Right handed   Social Determinants of Health   Financial Resource Strain: Not on file  Food Insecurity: No Food Insecurity (07/01/2022)   Hunger Vital Sign    Worried About Running Out of Food in the Last Year: Never true    [place] in the Last Year: Never true  Transportation Needs: No Transportation Needs  (07/01/2022)   PRAPARE - Hydrologist (Medical): No    Lack of Transportation (Non-Medical): No  Physical Activity: Not on file  Stress: Not on file  Social Connections: Not on file     Family History: The patient's family history includes Hypertension in his daughter; Renal Disease in his daughter.  ROS:    Please see the history of present illness.    All other systems reviewed and are negative.  EKGs/Labs/Other Studies Reviewed:    The following studies were reviewed today:   EKG:  EKG is ordered today.  The ekg ordered today demonstrates sinus rhythm, 74 bpm, rare PACs, otherwise nothing acute.  Echocardiogram on 11/14/2022:  1. Left ventricular ejection fraction, by estimation, is 55 to 60%. The  left ventricle has normal function. The left ventricle has no regional  wall motion abnormalities. There is mild left ventricular hypertrophy.  Left ventricular diastolic parameters  are consistent with Grade I diastolic dysfunction (impaired relaxation).  The average left ventricular global longitudinal strain is -19.0 %. The  global longitudinal strain is normal.   2. Right ventricular systolic function is normal. The right ventricular  size is normal. Tricuspid regurgitation signal is inadequate for assessing  PA pressure.   3. The mitral valve is normal in structure. Trivial mitral valve  regurgitation. No evidence of mitral stenosis.   4. The tricuspid valve is abnormal.   5. The aortic valve has an indeterminant number of cusps. Aortic valve  regurgitation is moderate. Severe aortic valve stenosis.   6. Limited visualization of the ascending aorta, the visualized parts are  at least moderately dilated. Consider additional imaging with CTA or MRA  to better evalute degree and extent of aortic aneurysm. Marland Kitchen Aortic  dilatation noted. There is mild  dilatation of the aortic root, measuring 42 mm. There is moderate  dilatation of the ascending  aorta, measuring 48 mm.   7. The inferior vena cava is normal in size with greater than 50%  respiratory variability, suggesting right atrial pressure of 3 mmHg.  Recent Labs: 12/04/2022: ALT 21; BUN 14; Creatinine, Ser 1.03; Hemoglobin 15.2; Platelets 225; Potassium 5.2; Sodium 141  Recent Lipid Panel    Component Value Date/Time   CHOL 139 12/04/2022 0856   TRIG 80 12/04/2022 0856   HDL 48 12/04/2022 0856   CHOLHDL 2.9 12/04/2022 0856   CHOLHDL 2.8 09/20/2020 0820   VLDL 25 05/18/2017 0913   LDLCALC 75 12/04/2022 0856   LDLCALC 59 09/20/2020 0820    Physical Exam:    VS:  BP 120/80 (BP Location: Left Arm, Patient Position: Sitting, Cuff Size: Normal)   Pulse 68   Ht 5' 8"$  (1.727 m)   Wt 174 lb (78.9 kg)   SpO2 98%   BMI 26.46 kg/m     Wt Readings from Last 3 Encounters:  01/08/23 174 lb (78.9 kg)  11/12/22 173 lb (78.5 kg)  09/04/22 175 lb (79.4 kg)     GEN: Well nourished, well developed in no acute distress HEENT: Normal NECK: No JVD; No carotid bruits CARDIAC: S1/S2, RRR, Grade 2/6 systolic murmur, no rubs or gallops noted; 2+ peripheral pulses RESPIRATORY:  Clear to auscultation without rales, wheezing or rhonchi MUSCULOSKELETAL:  No edema; No deformity  SKIN: Warm and dry NEUROLOGIC:  Alert and oriented x 3 PSYCHIATRIC:  Flat affect, withdrawn, appears depressed  ASSESSMENT:    1. Coronary artery disease involving native heart without angina pectoris, unspecified vessel or lesion type   2. Severe aortic stenosis by prior echocardiogram   3. Bicuspid aortic valve determined by imaging   4. Murmur   5. Aneurysm of ascending aorta without rupture ([place])   6. Aortic dilatation (HCC)   7. Hyperlipidemia, unspecified hyperlipidemia type   8. Essential hypertension, benign   9. PSVT (paroxysmal supraventricular tachycardia)   10. History of renal cell carcinoma   11. History of nephrectomy   12. Paresthesia of arm   13. Weakness of both legs     PLAN:     In order of problems listed above:  CAD, status post NSTEMI in 2015 Denies any anginal symptoms, however does admit to chronic left arm numbness, chronic exertional fatigue (see #2). Continues to note left upper chest, near left shoulder "fluttering," attributes this to nerve pain. Continue aspirin, Lipitor, losartan, and Toprol-XL. Heart healthy diet and regular cardiovascular exercise encouraged.  ED precautions discussed.  Consulted and case d/w Dr. Domenic Polite who recommended to proceed with chest CTA and to consider right and left heart cath arranged with TAVR related physicians, Dr. Angelena Form or Dr. Burt Knack.   2. Severe aortic stenosis, bicuspid aortic valve, murmur Echocardiogram reviewed by Dr. Domenic Polite.  Images revealed bicuspid aortic valve with severe aortic stenosis based on mean gradient and dimensionless index, has progressed compared to past echo.  Grade 2/6 systolic murmur heard on exam.  Patient does admit to chronic exertional fatigue, low energy.  Will proceed with chest CTA as mentioned below and at next visit discuss right and left heart cath for further evaluation as previously suggested by Dr. Domenic Polite. No medication changes at this time.   3. Ascending aortic aneurysm, aortic dilatation Moderate dilatation of ascending aorta noted on echocardiogram, measuring 48 mm.  Mild dilatation of aortic root measuring 42 mm.  It was recommended to consider additional imaging with CTA or MRA to better evaluate degree and extent of aortic aneurysm.  He is scheduled for chest CTA this upcoming Monday.  Care precautions and ED precautions discussed. Heart healthy diet encouraged.   4. Hyperlipidemia Lipid panel 1 month ago showed LDL 75. Continue atorvastatin. Heart healthy diet encouraged.   5. Hypertension Blood pressure today stable. Continue losartan and Toprol-XL.  Heart healthy diet encouraged.   6. PSVT Denies any palpitations.  Continue Toprol-XL. Heart healthy diet encouraged.    7. History of RCC, status post partial right nephrectomy in 2013 Denies any issues.  He follows up with nephrology annually.  Continue current medication regimen.  Continue to follow-up with nephrology.    8. Left arm numbness/tingling, weakness of lower extremities This is chronic for him.  He has seen neurology and orthopedics without any answers.  Normal ABI examination of lower extremities in June 2023.   Heart healthy diet encouraged. Continue to follow with PCP.  10. Disposition: Follow-up with me or APP in 1 month or sooner if anything changes.  Medication Adjustments/Labs and Tests Ordered: Current medicines are reviewed at length with the patient today.  Concerns regarding medicines are outlined above.  No orders of the defined types were placed in this encounter.  No orders of the defined types were placed in this encounter.   Patient Instructions  Medication Instructions:  Your physician recommends that you continue on your current medications as directed. Please refer to the Current Medication list given to you today.  Labwork: none  Testing/Procedures: none  Follow-Up: Your physician recommends that you schedule a follow-up appointment in: 1 month  Any Other Special Instructions Will Be Listed Below (If Applicable).  If you need a refill on your cardiac medications before your next appointment, please call your pharmacy.    Signed, Finis Bud, NP  01/08/2023 2:43 PM    [place]

## 2023-01-08 NOTE — Patient Instructions (Addendum)
Medication Instructions:  Your physician recommends that you continue on your current medications as directed. Please refer to the Current Medication list given to you today.  Labwork: none  Testing/Procedures: none  Follow-Up: Your physician recommends that you schedule a follow-up appointment in: 1 month  Any Other Special Instructions Will Be Listed Below (If Applicable).  If you need a refill on your cardiac medications before your next appointment, please call your pharmacy.

## 2023-01-12 ENCOUNTER — Ambulatory Visit (HOSPITAL_COMMUNITY)
Admission: RE | Admit: 2023-01-12 | Discharge: 2023-01-12 | Disposition: A | Discharge status: Home / Self Care | Payer: Medicare Other | Source: Ambulatory Visit | Attending: Nurse Practitioner | Admitting: Nurse Practitioner

## 2023-01-12 DIAGNOSIS — I77819 Aortic ectasia, unspecified site: Secondary | ICD-10-CM | POA: Diagnosis present

## 2023-01-12 MED ORDER — IOHEXOL 350 MG/ML SOLN
100.0000 mL | Freq: Once | INTRAVENOUS | Status: AC | PRN
  Administered 2023-01-12: 100 mL via INTRAVENOUS

## 2023-01-26 ENCOUNTER — Telehealth: Payer: Self-pay

## 2023-01-26 DIAGNOSIS — I77819 Aortic ectasia, unspecified site: Secondary | ICD-10-CM

## 2023-01-26 NOTE — Telephone Encounter (Signed)
Per [name], orders placed for referral to cardiothoracic surgeon. Sent to [facility] for scheduling.

## 2023-02-01 NOTE — Progress Notes (Deleted)
[address]       [name] [address]             [phone]           Edric J Andreoni [facility] Record [id number] Date of Birth: Dec 07, 1955  Finis Bud, NP Practice, [facility]  Chief Complaint:     History of Present Illness:           Past Medical History:  Diagnosis Date   Arthritis    Coronary atherosclerosis of native coronary artery    a. NSTEMI (02/2014):  LHC (02/2014):  Mild disease in LAD and CFX; prox RCA occluded with R-R collats, dist RCA filled by L-R collats, inf HK, EF 55%, LVEDP 15 mmHg.  PCI:  Unsuccessful angioplasty of RCA (late presentation of inf MI) - tx medically.   Essential hypertension    GERD (gastroesophageal reflux disease)    Hyperlipidemia    NSTEMI (non-ST elevated myocardial infarction) ([place]) 03/11/14   Renal cell carcinoma of right kidney ([place])    Partial nephrectomy in 2013    Past Surgical History:  Procedure Laterality Date   [place]  04/29/2021   COLONOSCOPY N/A 12/11/2020   Procedure: COLONOSCOPY;  Surgeon: Aviva Signs, MD;  [facility]: [facility];  Service: Gastroenterology;  Laterality: N/A;   LEFT HEART CATHETERIZATION WITH CORONARY ANGIOGRAM N/A 03/17/2014   Procedure: LEFT HEART CATHETERIZATION WITH CORONARY ANGIOGRAM;  Surgeon: Jettie Booze, MD;  Location: [facility] CATH LAB;  Service: Cardiovascular;  Laterality: N/A;   POLYPECTOMY  12/11/2020   Procedure: POLYPECTOMY;  Surgeon: Aviva Signs, MD;  Location: [facility];  Service: Gastroenterology;;   ROBOT ASSISTED LAPAROSCOPIC NEPHRECTOMY  01/14/2012   Procedure: ROBOTIC ASSISTED LAPAROSCOPIC NEPHRECTOMY;  Surgeon: Molli Hazard, MD;  Location: [facility];  Service: Urology;  Laterality: Right;  Robot Laparoscopic Right Partial Nephrectomy     TONSILLECTOMY      Social History   Tobacco Use  Smoking Status Former   Types: Cigarettes   Quit date: 11/18/1979   Years since  quitting: 43.2  Smokeless Tobacco Never    Social History   Substance and Sexual Activity  Alcohol Use No   Alcohol/week: 0.0 standard drinks of alcohol    Social History   Socioeconomic History   Marital status: Married    Spouse name: Not on file   Number of children: 1   Years of education: 14   Highest education level: Not on file  Occupational History   Occupation: Retired   Tobacco Use   Smoking status: Former    Types: Cigarettes    Quit date: 11/18/1979    Years since quitting: 43.2   Smokeless tobacco: Never  Vaping Use   Vaping Use: Never used  Substance and Sexual Activity   Alcohol use: No    Alcohol/week: 0.0 standard drinks of alcohol   Drug use: Yes    Types: Marijuana   Sexual activity: Not on file  Other Topics Concern   Not on file  Social History Narrative   Lives w/ wife   Caffeine use: 1 cup coffee every morning   Right handed   Social Determinants of Health   Financial Resource Strain: Not on file  Food Insecurity: No Food Insecurity (07/01/2022)   Hunger Vital Sign    Worried About Running Out of Food in the Last Year: Never true    Ran Out  of Food in the Last Year: Never true  Transportation Needs: No Transportation Needs (07/01/2022)   PRAPARE - Hydrologist (Medical): No    Lack of Transportation (Non-Medical): No  Physical Activity: Not on file  Stress: Not on file  Social Connections: Not on file  Intimate Partner Violence: Not on file    Allergies  Allergen Reactions   Alcohol Other (See Comments)    Pts skin gets red. Ex. Pt used alcohol based deodorant and got red things under arms. & Drinks alcohol notices "from heart on up" everything turns red.   Chlorthalidone     Dizziness and Syncopal Episode    Current Outpatient Medications  Medication Sig Dispense Refill   aspirin EC 81 MG EC tablet Take 1 tablet (81 mg total) by mouth daily. (Patient not taking: Reported on 01/08/2023)     atorvastatin  (LIPITOR) 80 MG tablet TAKE 1 TABLET ONCE DAILY. 30 tablet 2   baclofen (LIORESAL) 10 MG tablet Take 1 tablet (10 mg total) by mouth 3 (three) times daily. 90 each 5   finasteride (PROSCAR) 5 MG tablet Take 5 mg by mouth daily.  3   losartan (COZAAR) 100 MG tablet Take 1 tablet (100 mg total) by mouth daily. 90 tablet 1   metoprolol succinate (TOPROL-XL) 25 MG 24 hr tablet TAKE 1 TABLET ONCE DAILY. 90 tablet 1   No current facility-administered medications for this visit.     Family History  Problem Relation Age of Onset   Renal Disease Daughter    Hypertension Daughter        Physical Exam:      Diagnostic Studies & Laboratory data: I have personally reviewed the following studies and agree with the findings   TTE (10/2022) IMPRESSIONS     1. Left ventricular ejection fraction, by estimation, is 55 to 60%. The  left ventricle has normal function. The left ventricle has no regional  wall motion abnormalities. There is mild left ventricular hypertrophy.  Left ventricular diastolic parameters  are consistent with Grade I diastolic dysfunction (impaired relaxation).  The average left ventricular global longitudinal strain is -19.0 %. The  global longitudinal strain is normal.   2. Right ventricular systolic function is normal. The right ventricular  size is normal. Tricuspid regurgitation signal is inadequate for assessing  PA pressure.   3. The mitral valve is normal in structure. Trivial mitral valve  regurgitation. No evidence of mitral stenosis.   4. The tricuspid valve is abnormal.   5. The aortic valve has an indeterminant number of cusps. Aortic valve  regurgitation is moderate. Severe aortic valve stenosis.   6. Limited visualization of the ascending aorta, the visualized parts are  at least moderately dilated. Consider additional imaging with CTA or MRA  to better evalute degree and extent of aortic aneurysm. Marland Kitchen Aortic  dilatation noted. There is mild  dilatation  of the aortic root, measuring 42 mm. There is moderate  dilatation of the ascending aorta, measuring 48 mm.   7. The inferior vena cava is normal in size with greater than 50%  respiratory variability, suggesting right atrial pressure of 3 mmHg.   FINDINGS   Left Ventricle: Left ventricular ejection fraction, by estimation, is 55  to 60%. The left ventricle has normal function. The left ventricle has no  regional wall motion abnormalities. The average left ventricular global  longitudinal strain is -19.0 %.  The global longitudinal strain is normal. The left ventricular internal  cavity size was normal in size. There is mild left ventricular  hypertrophy. Left ventricular diastolic parameters are consistent with  Grade I diastolic dysfunction (impaired  relaxation). Normal left ventricular filling pressure.   Right Ventricle: The right ventricular size is normal. Right vetricular  wall thickness was not well visualized. Right ventricular systolic  function is normal. Tricuspid regurgitation signal is inadequate for  assessing PA pressure.   Left Atrium: Left atrial size was normal in size.   Right Atrium: Right atrial size was normal in size.   Pericardium: There is no evidence of pericardial effusion.   Mitral Valve: The mitral valve is normal in structure. Trivial mitral  valve regurgitation. No evidence of mitral valve stenosis.   Tricuspid Valve: The tricuspid valve is abnormal. Tricuspid valve  regurgitation is mild . No evidence of tricuspid stenosis.   Aortic Valve: The aortic valve has an indeterminant number of cusps.  Aortic valve regurgitation is moderate. Severe aortic stenosis is present.  Aortic valve mean gradient measures 43.0 mmHg. Aortic valve peak gradient  measures 66.0 mmHg. Aortic valve  area, by VTI measures 0.74 cm. Aortic valve mean gradient measures 43.0  mmHg. Aortic valve peak gradient measures 66.0 mmHg. Aortic valve area, by  VTI measures 0.74  cm. DI 0.26     Pulmonic Valve: The pulmonic valve was not well visualized. Pulmonic valve  regurgitation is not visualized. No evidence of pulmonic stenosis.   Aorta: Limited visualization of the ascending aorta, the visualized parts  are at least moderately dilated. Consider additional imaging with CTA or  MRA to better evalute degree and extent of aortic aneurysm. Aortic  dilatation noted. There is mild  dilatation of the aortic root, measuring 42 mm. There is moderate  dilatation of the ascending aorta, measuring 48 mm.   Venous: The inferior vena cava is normal in size with greater than 50%  respiratory variability, suggesting right atrial pressure of 3 mmHg.   IAS/Shunts: No atrial level shunt detected by color flow Doppler.     LEFT VENTRICLE  PLAX 2D  LVIDd:         4.50 cm     Diastology  LVIDs:         3.35 cm     LV e' medial:    4.70 cm/s  LV PW:         1.10 cm     LV E/e' medial:  15.1  LV IVS:        1.15 cm     LV e' lateral:   5.92 cm/s  LVOT diam:     1.90 cm     LV E/e' lateral: 12.0  LV SV:         74  LV SV Index:   38          2D Longitudinal Strain  LVOT Area:     2.84 cm    2D Strain GLS (A2C):   -19.4 %                             2D Strain GLS (A3C):   -18.7 %                             2D Strain GLS (A4C):   -18.8 %  LV Volumes (MOD)           2D Strain GLS Avg:     -  19.0 %  LV vol d, MOD A2C: 59.7 ml  LV vol d, MOD A4C: 57.2 ml  LV vol s, MOD A2C: 22.8 ml  LV vol s, MOD A4C: 22.0 ml  LV SV MOD A2C:     36.9 ml  LV SV MOD A4C:     57.2 ml  LV SV MOD BP:      37.6 ml   RIGHT VENTRICLE  RV S prime:     10.20 cm/s  TAPSE (M-mode): 2.0 cm   LEFT ATRIUM             Index        RIGHT ATRIUM           Index  LA diam:        2.90 cm 1.51 cm/m   RA Area:     12.70 cm  LA Vol (A2C):   36.5 ml 18.99 ml/m  RA Volume:   26.50 ml  13.79 ml/m  LA Vol (A4C):   27.9 ml 14.52 ml/m  LA Biplane Vol: 31.9 ml 16.60 ml/m   AORTIC VALVE  AV Area (Vmax):     0.74 cm  AV Area (Vmean):   0.76 cm  AV Area (VTI):     0.74 cm  AV Vmax:           406.33 cm/s  AV Vmean:          298.333 cm/s  AV VTI:            1.000 m  AV Peak Grad:      66.0 mmHg  AV Mean Grad:      43.0 mmHg  LVOT Vmax:         106.00 cm/s  LVOT Vmean:        80.000 cm/s  LVOT VTI:          0.260 m  LVOT/AV VTI ratio: 0.26    AORTA  Ao Root diam: 4.20 cm  Ao Asc diam:  4.80 cm  Ao Arch diam: 3.0 cm   MITRAL VALVE  MV Area (PHT): 2.39 cm     SHUNTS  MV Decel Time: 317 msec     Systemic VTI:  0.26 m  MR Peak grad: 63.4 mmHg     Systemic Diam: 1.90 cm  MR Vmax:      398.00 cm/s  MV E velocity: 71.10 cm/s  MV A velocity: 112.00 cm/s  MV E/A ratio:  0.63    Recent Radiology Findings:   CTA Chest (01/2023)   FINDINGS: Cardiovascular: Preferential opacification of the thoracic aorta. The ascending thoracic aorta measures 53 x 50 mm in diameter. Normal caliber of the aortic arch and descending thoracic aorta. There is thickening and calcification of the aortic valve leaflets. Scattered calcified atherosclerotic plaque in the aorta. Normal heart size. No pericardial effusion. Coronary artery calcification.   Mediastinum/Nodes: Unremarkable esophagus.  No thoracic adenopathy.   Lungs/Pleura: Lungs are clear. No pleural effusion or pneumothorax.   Upper Abdomen: No acute abnormality.   Musculoskeletal: No chest wall abnormality. No acute or significant osseous findings.   Review of the MIP images confirms the above findings.   IMPRESSION: 1. Ascending thoracic aortic aneurysm measuring 53 x 50 mm in diameter. Recommend semi-annual imaging followup by CTA or MRA and referral to cardiothoracic surgery if not already obtained. This recommendation follows 2010 ACCF/AHA/AATS/ACR/ASA/SCA/SCAI/SIR/STS/SVM Guidelines for the Diagnosis and Management of Patients With Thoracic Aortic Disease. Circulation. 2010; [id number]. Aortic aneurysm NOS (ICD10-I71.9)  2.  Thickening and calcification of the aortic valve. 3. Coronary artery and Aortic Atherosclerosis (ICD10-I70.0).    Recent Lab Findings: Lab Results  Component Value Date   WBC 6.1 12/04/2022   HGB 15.2 12/04/2022   HCT 44.8 12/04/2022   PLT 225 12/04/2022   GLUCOSE 103 (H) 12/04/2022   CHOL 139 12/04/2022   TRIG 80 12/04/2022   HDL 48 12/04/2022   LDLCALC 75 12/04/2022   ALT 21 12/04/2022   AST 17 12/04/2022   NA 141 12/04/2022   K 5.2 12/04/2022   CL 103 12/04/2022   CREATININE 1.03 12/04/2022   BUN 14 12/04/2022   CO2 23 12/04/2022   INR 1.08 03/17/2014   HGBA1C 5.8 (H) 09/20/2020      Assessment / Plan:        I have spent *** min in review of the records, viewing studies and in face to face with patient and in coordination of future care    Coralie Common 02/01/2023 10:26 AM

## 2023-02-02 ENCOUNTER — Encounter: Payer: Medicare Other | Admitting: Thoracic Surgery (Cardiothoracic Vascular Surgery)

## 2023-02-02 ENCOUNTER — Encounter: Payer: Self-pay | Admitting: Thoracic Surgery (Cardiothoracic Vascular Surgery)

## 2023-02-10 ENCOUNTER — Ambulatory Visit: Payer: Medicare Other | Admitting: Nurse Practitioner

## 2023-02-20 ENCOUNTER — Ambulatory Visit (INDEPENDENT_AMBULATORY_CARE_PROVIDER_SITE_OTHER): Payer: Medicare Other | Admitting: Pulmonary Disease

## 2023-02-20 ENCOUNTER — Encounter: Payer: Self-pay | Admitting: Pulmonary Disease

## 2023-02-20 VITALS — BP 161/98 | HR 58 | Ht 68.5 in | Wt 174.4 lb

## 2023-02-20 DIAGNOSIS — R053 Chronic cough: Secondary | ICD-10-CM | POA: Diagnosis not present

## 2023-02-20 NOTE — Patient Instructions (Addendum)
Common causes could be nasal drip , reflux or asthma  If cough comes back, Trial of chlorpheniramine 4 mg at bedtime x 2 weeks + phenylephrine 10 mg daytime  (combination [facility] brand 'sinus PE')  If no response, then we can treat for reflux or asthma

## 2023-02-20 NOTE — Progress Notes (Signed)
Subjective:    Patient ID: [name], male    DOB: Oct 07, 1956, 67 y.o.   MRN: [id number]  HPI   Chief Complaint  Patient presents with   Consult    Pt consult for chronic cough ongoing > 2 years. He does report that it has gotten slightly worse in the past 1 month.   67 year old remote smoker presents for evaluation of chronic cough for 2 years  PMH : Bicuspid aortic valve, severe AS Ascending aortic aneurysm, aortic dilatation CAD, s/p NSTEMI in 2015 HTN PSVT HLD Hx of RCC, s/p partial right nephrectomy in 2013 Chronic back pain, lower extremity weakness and paresthesias, ambulates with a cane  He quit smoking at age 67.  He admits to smoking 1-2 referred every day.  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 Reports cough of insidious onset for almost 2 years.  This would not have a diurnal or seasonal variation, productive of stringy clear sputum which is difficult to expectorate.  About a month ago he was given a course of prednisone which she took over a week and an albuterol inhaler and this made him feel much better.  He is being evaluated by cardiology for TAVR versus open heart surgery for aortic stenosis.  I have reviewed cardiology consultation  He reports postnasal drip which is not seasonal.  He reports heartburn but this appears to be well-controlled over the past few months and has not required Tums. He has some kind of lower extremity weakness with paresthesias and ambulates with a cane with chronic back pain  Significant tests/ events reviewed  CTA chest 12/2022 >>asc TAA 53 x 50 mm   Past Medical History:  Diagnosis Date   Arthritis    Coronary atherosclerosis of native coronary artery    a. NSTEMI (02/2014):  LHC (02/2014):  Mild disease in LAD and CFX; prox RCA occluded with R-R collats, dist RCA filled by L-R collats, inf HK, EF 55%, LVEDP 15 mmHg.  PCI:  Unsuccessful angioplasty  of RCA (late presentation of inf MI) - tx medically.   Essential hypertension    GERD (gastroesophageal reflux disease)    Hyperlipidemia    NSTEMI (non-ST elevated myocardial infarction) 03/11/14   Renal cell carcinoma of right kidney    Partial nephrectomy in 2013   Past Surgical History:  Procedure Laterality Date   BACK SURGERY     1989   CERVICAL DISC SURGERY  04/29/2021   COLONOSCOPY N/A 12/11/2020   Procedure: COLONOSCOPY;  Surgeon: Franky Macho, MD;  [facility];  Service: Gastroenterology;  Laterality: N/A;   LEFT HEART CATHETERIZATION WITH CORONARY ANGIOGRAM N/A 03/17/2014   Procedure: LEFT HEART CATHETERIZATION WITH CORONARY ANGIOGRAM;  Surgeon: Corky Crafts, MD;  Location: [facility] CATH LAB;  Service: Cardiovascular;  Laterality: N/A;   POLYPECTOMY  12/11/2020   Procedure: POLYPECTOMY;  Surgeon: Franky Macho, MD;  Location: [facility];  Service: Gastroenterology;;   ROBOT ASSISTED LAPAROSCOPIC NEPHRECTOMY  01/14/2012   Procedure: ROBOTIC ASSISTED LAPAROSCOPIC NEPHRECTOMY;  Surgeon: Milford Cage, MD;  Location: [facility];  Service: Urology;  Laterality: Right;  Robot Laparoscopic Right Partial Nephrectomy     TONSILLECTOMY      Allergies  Allergen Reactions   Alcohol Other (See Comments)    Pts skin gets red. Ex. Pt used alcohol based deodorant and got red things under arms. & Drinks alcohol notices "from heart  on up" everything turns red.   Chlorthalidone     Dizziness and Syncopal Episode    Social History   Socioeconomic History   Marital status: Married    Spouse name: Not on file   Number of children: 1   Years of education: 14   Highest education level: Not on file  Occupational History   Occupation: Retired   Tobacco Use   Smoking status: Former    Types: Cigarettes    Quit date: 11/18/1979    Years since quitting: 43.2   Smokeless tobacco: Never  Vaping Use   Vaping Use: Never used  Substance and Sexual Activity   Alcohol  use: No    Alcohol/week: 0.0 standard drinks of alcohol   Drug use: Yes    Types: Marijuana   Sexual activity: Not on file  Other Topics Concern   Not on file  Social History Narrative   Lives w/ wife   Caffeine use: 1 cup coffee every morning   Right handed   Social Determinants of Health   Financial Resource Strain: Not on file  Food Insecurity: No Food Insecurity (07/01/2022)   Hunger Vital Sign    Worried About Running Out of Food in the Last Year: Never true    Ran Out of Food in the Last Year: Never true  Transportation Needs: No Transportation Needs (07/01/2022)   PRAPARE - Administrator, Civil Service (Medical): No    Lack of Transportation (Non-Medical): No  Physical Activity: Not on file  Stress: Not on file  Social Connections: Not on file  Intimate Partner Violence: Not on file    Family History  Problem Relation Age of Onset   Renal Disease Daughter    Hypertension Daughter      Review of Systems Constitutional: negative for anorexia, fevers and sweats  Eyes: negative for irritation, redness and visual disturbance  Ears, nose, mouth, throat, and face: negative for earaches, epistaxis, nasal congestion and sore throat  Respiratory: negative for dyspnea on exertion, sputum and wheezing  Cardiovascular: negative for chest pain, dyspnea, lower extremity edema, orthopnea, palpitations and syncope  Gastrointestinal: negative for abdominal pain, constipation, diarrhea, melena, nausea and vomiting  Genitourinary:negative for dysuria, frequency and hematuria  Hematologic/lymphatic: negative for bleeding, easy bruising and lymphadenopathy  Musculoskeletal:negative for arthralgias, muscle weakness and stiff joints  Neurological: negative for coordination problems, gait problems, headaches and weakness  Endocrine: negative for diabetic symptoms including polydipsia, polyuria and weight loss     Objective:   Physical Exam  Gen. Pleasant, well-nourished,  in no distress, normal affect ENT - no pallor,icterus, no post nasal drip Neck: No JVD, no thyromegaly, no carotid bruits Lungs: no use of accessory muscles, no dullness to percussion, clear without rales or rhonchi  Cardiovascular: Rhythm regular, heart sounds  normal, no murmurs or gallops, no peripheral edema Abdomen: soft and non-tender, no hepatosplenomegaly, BS normal. Musculoskeletal: No deformities, no cyanosis or clubbing Neuro:  alert, non focal, ambulates with a cane, lower extremity 4/5       Assessment & Plan:

## 2023-02-20 NOTE — Assessment & Plan Note (Addendum)
CT angiogram chest does not show any evidence of pulmonary fibrosis.  We have to consider the usual suspects and a cough that is longstanding of postnasal drip, silent reflux and cough variant asthma. He seems to have good improvement with prednisone and albuterol MDI seems to help much.  This points to either allergic postnasal drip or asthma. We will undertake sequential treatment starting with treatment for a UA CS: Trial of chlorpheniramine 4 mg at bedtime x 3 weeks + phenylephrine 10 mg daytime  (combination [facility] brand 'sinus PE')  If this does not work, we can consider PPI versus inhaled steroid for cough variant asthma and check PFTs.  Currently his cough seems to be doing better.  Smoking weed may also be making this cough worse and have asked him to cut down on this

## 2023-02-23 ENCOUNTER — Ambulatory Visit: Payer: Medicare Other | Attending: Nurse Practitioner | Admitting: Nurse Practitioner

## 2023-02-23 ENCOUNTER — Encounter: Payer: Self-pay | Admitting: Nurse Practitioner

## 2023-02-23 VITALS — BP 135/80 | HR 57 | Ht 68.0 in | Wt 178.4 lb

## 2023-02-23 DIAGNOSIS — R011 Cardiac murmur, unspecified: Secondary | ICD-10-CM | POA: Insufficient documentation

## 2023-02-23 DIAGNOSIS — R200 Anesthesia of skin: Secondary | ICD-10-CM | POA: Insufficient documentation

## 2023-02-23 DIAGNOSIS — I7121 Aneurysm of the ascending aorta, without rupture: Secondary | ICD-10-CM | POA: Diagnosis not present

## 2023-02-23 DIAGNOSIS — Q231 Congenital insufficiency of aortic valve: Secondary | ICD-10-CM | POA: Diagnosis present

## 2023-02-23 DIAGNOSIS — I2510 Atherosclerotic heart disease of native coronary artery without angina pectoris: Secondary | ICD-10-CM | POA: Insufficient documentation

## 2023-02-23 DIAGNOSIS — Z85528 Personal history of other malignant neoplasm of kidney: Secondary | ICD-10-CM | POA: Diagnosis present

## 2023-02-23 DIAGNOSIS — R29898 Other symptoms and signs involving the musculoskeletal system: Secondary | ICD-10-CM | POA: Diagnosis present

## 2023-02-23 DIAGNOSIS — E785 Hyperlipidemia, unspecified: Secondary | ICD-10-CM | POA: Diagnosis present

## 2023-02-23 DIAGNOSIS — R202 Paresthesia of skin: Secondary | ICD-10-CM | POA: Insufficient documentation

## 2023-02-23 DIAGNOSIS — I471 Supraventricular tachycardia, unspecified: Secondary | ICD-10-CM | POA: Diagnosis present

## 2023-02-23 DIAGNOSIS — I10 Essential (primary) hypertension: Secondary | ICD-10-CM | POA: Insufficient documentation

## 2023-02-23 DIAGNOSIS — I350 Nonrheumatic aortic (valve) stenosis: Secondary | ICD-10-CM | POA: Insufficient documentation

## 2023-02-23 MED ORDER — METOPROLOL SUCCINATE ER 25 MG PO TB24: ORAL_TABLET | ORAL | 1 refills | Status: DC

## 2023-02-23 NOTE — Progress Notes (Addendum)
Cardiology Office Note:    Date:  02/23/2023  ID:  Curtis [name], DOB [birth year], MRN [id number]  PCP:  [facility]   [facility] Providers Cardiologist:  Nona Dell, MD     Referring MD: [facility]   CC: Here for follow-up  History of Present Illness:    Curtis [name] is a very delightful 67 y.o. male with a hx of the following:  Bicuspid aortic valve, severe AS Ascending aortic aneurysm, aortic dilatation CAD, s/p NSTEMI in 2015 HTN PSVT HLD Hx of RCC, s/p partial right nephrectomy in 2013  Had [name] NSTEMI in 2015, underwent cardiac cath that showed occlusion of RCA filled by collaterals and had unsuccessful PCI, medical management recommended.  History of renal cell carcinoma, s/p partial nephrectomy in 2013.   Seen by Curtis An, PA-C on September 12, 2020.  CC bilateral leg weakness, worse with activity.  Noted associated paresthesias.  Denied CP/SHOB.    I first saw this patient 10/2022. Noted chronic left arm numbness, worse with laying down at night on his left side. Noted several MSK issues. Was followed by neurology and orthopedics, was going to go for a third opinion. Noted chronic stress.  Denied CP, but reported nerve pain along upper left chest, says it feels like "fluttering." Was referred to pulmonology for chronic cough. Echocardiogram report noted below. Case d/w Diona Browner who recommended to arrange a chest CTA and to consider cardiac cath as next step if he were having exertional symptomatology.   Saw him for follow-up 12/2022. Was doing the same. Denied CP,  admitted to exertional fatigue and low energy.   Today he presents for follow-up. Doing better per his report. Had recent relief of chest congestion, medications improved his symptoms. Still admits to fatigue, low energy. Denies any chest pain, shortness of breath, palpitations, syncope, presyncope, dizziness, orthopnea, PND, swelling or significant weight  changes, acute bleeding, or claudication.  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   Past Medical History:  Diagnosis Date   Arthritis    Coronary atherosclerosis of native coronary artery    a. NSTEMI (02/2014):  LHC (02/2014):  Mild disease in LAD and CFX; prox RCA occluded with R-R collats, dist RCA filled by L-R collats, inf HK, EF 55%, LVEDP 15 mmHg.  PCI:  Unsuccessful angioplasty of RCA (late presentation of inf MI) - tx medically.   Essential hypertension    GERD (gastroesophageal reflux disease)    Hyperlipidemia    NSTEMI (non-ST elevated myocardial infarction) 03/11/14   Renal cell carcinoma of right kidney    Partial nephrectomy in 2013    Past Surgical History:  Procedure Laterality Date   BACK SURGERY     1989   CERVICAL DISC SURGERY  04/29/2021   COLONOSCOPY N/A 12/11/2020   Procedure: COLONOSCOPY;  Surgeon: Franky Macho, MD;  Location: [facility];  Service: Gastroenterology;  Laterality: N/A;   LEFT HEART CATHETERIZATION WITH CORONARY ANGIOGRAM N/A 03/17/2014   Procedure: LEFT HEART CATHETERIZATION WITH CORONARY ANGIOGRAM;  Surgeon: Corky Crafts, MD;  Location: [facility] CATH LAB;  Service: Cardiovascular;  Laterality: N/A;   POLYPECTOMY  12/11/2020   Procedure: POLYPECTOMY;  Surgeon: Franky Macho, MD;  Location: [facility];  Service: Gastroenterology;;   ROBOT ASSISTED LAPAROSCOPIC NEPHRECTOMY  01/14/2012   Procedure: ROBOTIC ASSISTED LAPAROSCOPIC NEPHRECTOMY;  Surgeon: Milford Cage, MD;  Location: [facility];  Service: Urology;  Laterality: Right;  Robot Laparoscopic Right Partial  Nephrectomy     TONSILLECTOMY      Current Medications: Current Meds  Medication Sig   albuterol (VENTOLIN HFA) 108 (90 Base) MCG/ACT inhaler Inhale 2 puffs into the lungs every 6 (six) hours as needed.   aspirin EC 81 MG EC tablet Take 1 tablet (81 mg total) by mouth daily.   atorvastatin (LIPITOR) 80 MG tablet TAKE 1 TABLET ONCE DAILY.   baclofen  (LIORESAL) 10 MG tablet Take 1 tablet (10 mg total) by mouth 3 (three) times daily.   finasteride (PROSCAR) 5 MG tablet Take 5 mg by mouth daily.   losartan (COZAAR) 100 MG tablet Take 1 tablet (100 mg total) by mouth daily.   [DISCONTINUED] metoprolol succinate (TOPROL-XL) 25 MG 24 hr tablet TAKE 1 TABLET ONCE DAILY.     Allergies:   Alcohol and Chlorthalidone   Social History   Socioeconomic History   Marital status: Married    Spouse name: Not on file   Number of children: 1   Years of education: 14   Highest education level: Not on file  Occupational History   Occupation: Retired   Tobacco Use   Smoking status: Former    Types: Cigarettes    Quit date: 11/18/1979    Years since quitting: 43.3   Smokeless tobacco: Never  Vaping Use   Vaping Use: Never used  Substance and Sexual Activity   Alcohol use: No    Alcohol/week: 0.0 standard drinks of alcohol   Drug use: Yes    Types: Marijuana   Sexual activity: Not on file  Other Topics Concern   Not on file  Social History Narrative   Lives w/ wife   Caffeine use: 1 cup coffee every morning   Right handed   Social Determinants of Health   Financial Resource Strain: Not on file  Food Insecurity: No Food Insecurity (07/01/2022)   Hunger Vital Sign    Worried About Running Out of Food in the Last Year: Never true    Ran Out of Food in the Last Year: Never true  Transportation Needs: No Transportation Needs (07/01/2022)   PRAPARE - Administrator, Civil ServiceTransportation    Lack of Transportation (Medical): No    Lack of Transportation (Non-Medical): No  Physical Activity: Not on file  Stress: Not on file  Social Connections: Not on file     Family History: The patient's family history includes Hypertension in his daughter; Renal Disease in his daughter.  ROS:    Please see the history of present illness.    All other systems reviewed and are negative.  EKGs/Labs/Other Studies Reviewed:    The following studies were reviewed  today:   EKG:  EKG is not ordered today.    CT angio chest 12/2022: IMPRESSION: 1. Ascending thoracic aortic aneurysm measuring 53 x 50 mm in diameter. Recommend semi-annual imaging followup by CTA or MRA and referral to cardiothoracic surgery if not already obtained. This recommendation follows 2010 ACCF/AHA/AATS/ACR/ASA/SCA/SCAI/SIR/STS/SVM Guidelines for the Diagnosis and Management of Patients With Thoracic Aortic Disease. Circulation. 2010; 121: Z610-R604: E266-e369. Aortic aneurysm NOS (ICD10-I71.9) 2. Thickening and calcification of the aortic valve. 3. Coronary artery and Aortic Atherosclerosis (ICD10-I70.0).  Echocardiogram on 11/14/2022:  1. Left ventricular ejection fraction, by estimation, is 55 to 60%. The  left ventricle has normal function. The left ventricle has no regional  wall motion abnormalities. There is mild left ventricular hypertrophy.  Left ventricular diastolic parameters  are consistent with Grade I diastolic dysfunction (impaired relaxation).  The average left ventricular  global longitudinal strain is -19.0 %. The  global longitudinal strain is normal.   2. Right ventricular systolic function is normal. The right ventricular  size is normal. Tricuspid regurgitation signal is inadequate for assessing  PA pressure.   3. The mitral valve is normal in structure. Trivial mitral valve  regurgitation. No evidence of mitral stenosis.   4. The tricuspid valve is abnormal.   5. The aortic valve has [name] indeterminant number of cusps. Aortic valve  regurgitation is moderate. Severe aortic valve stenosis.   6. Limited visualization of the ascending aorta, the visualized parts are  at least moderately dilated. Consider additional imaging with CTA or MRA  to better evalute degree and extent of aortic aneurysm. Marland Kitchen Aortic  dilatation noted. There is mild  dilatation of the aortic root, measuring 42 mm. There is moderate  dilatation of the ascending aorta, measuring 48 mm.   7.  The inferior vena cava is normal in size with greater than 50%  respiratory variability, suggesting right atrial pressure of 3 mmHg.  Recent Labs: 12/04/2022: ALT 21; BUN 14; Creatinine, Ser 1.03; Hemoglobin 15.2; Platelets 225; Potassium 5.2; Sodium 141  Recent Lipid Panel    Component Value Date/Time   CHOL 139 12/04/2022 0856   TRIG 80 12/04/2022 0856   HDL 48 12/04/2022 0856   CHOLHDL 2.9 12/04/2022 0856   CHOLHDL 2.8 09/20/2020 0820   VLDL 25 05/18/2017 0913   LDLCALC 75 12/04/2022 0856   LDLCALC 59 09/20/2020 0820    Physical Exam:    VS:  BP 135/80 (BP Location: Left Arm, Patient Position: Sitting, Cuff Size: Normal)   Pulse (!) 57   Ht 5\' 8"  (1.727 m)   Wt 178 lb 6.4 oz (80.9 kg)   SpO2 98%   BMI 27.13 kg/m     Wt Readings from Last 3 Encounters:  02/23/23 178 lb 6.4 oz (80.9 kg)  02/20/23 174 lb 6.4 oz (79.1 kg)  01/08/23 174 lb (78.9 kg)     GEN: Well nourished, well developed in no acute distress HEENT: Normal NECK: No JVD; No carotid bruits CARDIAC: S1/S2, RRR, Grade 2/6 systolic murmur, no rubs or gallops noted; 2+ peripheral pulses RESPIRATORY:  Clear to auscultation without rales, wheezing or rhonchi MUSCULOSKELETAL:  No edema; No deformity  SKIN: Warm and dry NEUROLOGIC:  Alert and oriented x 3 PSYCHIATRIC:  Flat affect, withdrawn, appears depressed  ASSESSMENT:    1. Severe aortic valve stenosis   2. Bicuspid aortic valve determined by imaging   3. Murmur   4. Coronary artery disease involving native coronary artery of native heart without angina pectoris   5. Aneurysm of ascending aorta without rupture   6. Hyperlipidemia, unspecified hyperlipidemia type   7. Essential hypertension, benign   8. PSVT (paroxysmal supraventricular tachycardia)   9. History of renal cell carcinoma   10. Numbness and tingling in left arm   11. Weakness of both legs      PLAN:    In order of problems listed above:  1. Severe aortic stenosis, bicuspid aortic  valve, murmur TTE reviewed by Dr. Diona Browner.  Images revealed bicuspid aortic valve with severe aortic stenosis based on mean gradient and dimensionless index, has progressed compared to past echo.  Grade 2/6 systolic murmur heard on exam.  Patient does admit to chronic exertional fatigue, low energy, however politely declines right and left heart cath, wants to think about this.  Will proceed with referral to TAVR related physicians, Dr. Excell Seltzer or Dr.  Mcalhany who also perform right and left heart catheterizations. Will route note to one of the TAVR physicians regarding med recs.   Addendum: Dr. Excell Seltzer stated no medication adjustments needed at this time.   2. CAD, status post NSTEMI in 2015 Denies any anginal symptoms, however does admit to chronic left arm numbness, chronic exertional fatigue (see #2). Continues to note nerve pain. Continue aspirin, Lipitor, losartan, and Toprol-XL. Heart healthy diet and regular cardiovascular exercise encouraged.  ED precautions discussed. Discussed right and left heart cath, currently wants to think about this, will refer patient to TAVR related physicians, Dr. Excell Seltzer or Dr. Clifton James.    3. Thoracic aortic aneurysm CTA of chest revealed ascending thoracic aortic aneurysm measuring 53 x 50 mm. Was recommended for semi-annual imaging via CTA/MRA and referral to cardiothoracic surgery if not already obtained. Previously referred to cardiothoracic surgery. are precautions and ED precautions discussed. Heart healthy diet encouraged.   4. Hyperlipidemia Past LDL 75. Continue atorvastatin. Heart healthy diet encouraged.   5. Hypertension Blood pressure today stable. Continue losartan and Toprol-XL.  Heart healthy diet encouraged.   6. PSVT Denies any palpitations.  Continue Toprol-XL. Heart healthy diet encouraged.   7. History of RCC, status post partial right nephrectomy in 2013 Denies any issues.  He follows up with nephrology annually.  Continue current  medication regimen.  Continue to follow-up with nephrology.    8. Left arm numbness/tingling, weakness of lower extremities This is chronic for him.  He has seen neurology and orthopedics without any answers.  Normal ABI examination of lower extremities in June 2023.   Heart healthy diet encouraged. Continue to follow with PCP.  10. Disposition: Will refill medication per his request. Follow-up with me or APP in 3-4 months or sooner if anything changes.  Medication Adjustments/Labs and Tests Ordered: Current medicines are reviewed at length with the patient today.  Concerns regarding medicines are outlined above.  Orders Placed This Encounter  Procedures   Ambulatory referral to Structural Heart/Valve Clinic (only at [facility])   Meds ordered this encounter  Medications   metoprolol succinate (TOPROL-XL) 25 MG 24 hr tablet    Sig: TAKE 1 TABLET ONCE DAILY.    Dispense:  90 tablet    Refill:  1    Patient Instructions  Medication Instructions:  Your physician recommends that you continue on your current medications as directed. Please refer to the Current Medication list given to you today.   Labwork: none  Testing/Procedures: none  Follow-Up:  Your physician recommends that you schedule a follow-up appointment in: 3-4 months  Any Other Special Instructions Will Be Listed Below (If Applicable).  You have been referred to the Structural Heart Team  If you need a refill on your cardiac medications before your next appointment, please call your pharmacy.     SignedSharlene Dory, NP  02/25/2023 11:55 AM    [facility]

## 2023-02-23 NOTE — Patient Instructions (Signed)
Medication Instructions:  Your physician recommends that you continue on your current medications as directed. Please refer to the Current Medication list given to you today.   Labwork: none  Testing/Procedures: none  Follow-Up:  Your physician recommends that you schedule a follow-up appointment in: 3-4 months  Any Other Special Instructions Will Be Listed Below (If Applicable).  You have been referred to the Structural Heart Team  If you need a refill on your cardiac medications before your next appointment, please call your pharmacy.

## 2023-02-25 ENCOUNTER — Encounter: Payer: Self-pay | Admitting: Nurse Practitioner

## 2023-03-06 ENCOUNTER — Other Ambulatory Visit: Payer: Self-pay | Admitting: Cardiology

## 2023-03-23 ENCOUNTER — Encounter: Payer: Self-pay | Admitting: Cardiovascular Disease

## 2023-03-23 ENCOUNTER — Ambulatory Visit: Payer: Medicare Other | Attending: Cardiovascular Disease | Admitting: Cardiovascular Disease

## 2023-03-23 VITALS — BP 130/100 | HR 51 | Ht 68.0 in | Wt 177.2 lb

## 2023-03-23 DIAGNOSIS — Z01810 Encounter for preprocedural cardiovascular examination: Secondary | ICD-10-CM | POA: Diagnosis not present

## 2023-03-23 DIAGNOSIS — I350 Nonrheumatic aortic (valve) stenosis: Secondary | ICD-10-CM

## 2023-03-23 DIAGNOSIS — I10 Essential (primary) hypertension: Secondary | ICD-10-CM

## 2023-03-23 MED ORDER — AMLODIPINE BESYLATE 5 MG PO TABS: 5.0000 mg | ORAL_TABLET | Freq: Every day | ORAL | 3 refills | Status: DC

## 2023-03-23 NOTE — Progress Notes (Addendum)
Pre Surgical Assessment: 5 M Walk Test  41M=16.14ft  5 Meter Walk Test- trial 1: 9.75 seconds 5 Meter Walk Test- trial 2: 7.99 seconds 5 Meter Walk Test- trial 3: 8.90 seconds 5 Meter Walk Test Average: 8.88 seconds    ____________________    Procedure Type: Isolated AVR PERIOPERATIVE OUTCOME ESTIMATE % Operative Mortality 0.923% Morbidity & Mortality 4.17% Stroke 0.674% Renal Failure 0.599% Reoperation 3.13% Prolonged Ventilation 1.73% Deep Sternal Wound Infection 0.054% Long Hospital Stay (>14 days) 1.37% Short Hospital Stay (<6 days)* 69.9%

## 2023-03-23 NOTE — Patient Instructions (Signed)
Medication Instructions:  START Amlodipine 5mg  daily *If you need a refill on your cardiac medications before your next appointment, please call your pharmacy*   Lab Work: CBC, BMET today If you have labs (blood work) drawn today and your tests are completely normal, you will receive your results only by: MyChart Message (if you have MyChart) OR A paper copy in the mail If you have any lab test that is abnormal or we need to change your treatment, we will call you to review the results.   Testing/Procedures: R & L heart catheterization Your physician has requested that you have a cardiac catheterization. Cardiac catheterization is used to diagnose and/or treat various heart conditions. Doctors may recommend this procedure for a number of different reasons. The most common reason is to evaluate chest pain. Chest pain can be a symptom of coronary artery disease (CAD), and cardiac catheterization can show whether plaque is narrowing or blocking your heart's arteries. This procedure is also used to evaluate the valves, as well as measure the blood flow and oxygen levels in different parts of your heart. For further information please visit [email or web address] Please follow instruction sheet, as given.  Follow-Up: At [facility], you and your health needs are our priority.  As part of our continuing mission to provide you with exceptional heart care, we have created designated Provider Care Teams.  These Care Teams include your primary Cardiologist (physician) and Advanced Practice Providers (APPs -  Physician Assistants and Nurse Practitioners) who all work together to provide you with the care you need, when you need it.  Your next appointment:   Structural Team will follow-up  Provider:   Tonny Bollman, MD      Other Instructions       Cardiac/Peripheral Catheterization   You are scheduled for a Cardiac Catheterization on Friday, May 17 with Dr. Tonny Bollman.  1.  Please arrive at the [facility] (Main Entrance A) at [facility]: [address] [place], [place] [address] at 7:00am (This time is two hour(s) before your procedure to ensure your preparation). Free valet parking service is available. You will check in at ADMITTING. The support person will be asked to wait in the waiting room.  It is OK to have someone drop you off and come back when you are ready to be discharged.        Special note: Every effort is made to have your procedure done on time. Please understand that emergencies sometimes delay scheduled procedures.  2. Diet: Do not eat solid foods after midnight.  You may have clear liquids until 5 AM the day of the procedure.  3. Labs: TODAY  4. Medication instructions in preparation for your procedure:   Contrast Allergy: No  On the morning of your procedure, take Aspirin 81 mg and any morning medicines NOT listed above.  You may use sips of water.  5. Plan to go home the same day, you will only stay overnight if medically necessary. 6. You MUST have a responsible adult to drive you home. 7. An adult MUST be with you the first 24 hours after you arrive home. 8. Bring a current list of your medications, and the last time and date medication taken. 9. Bring ID and current insurance cards. 10.Please wear clothes that are easy to get on and off and wear slip-on shoes.  Thank you for allowing [place] to care for you!   -- [facility]

## 2023-03-23 NOTE — Progress Notes (Signed)
Cardiology Office Note:    Date:  03/27/2023   ID:  [name], Curtis [birth year], MRN [id number]  PCP:  [facility]   [facility] Providers Cardiologist:  Nona Dell, MD     Referring MD: [facility]   Chief Complaint  Patient presents with   Aortic Stenosis    History of Present Illness:    [name] is a 67 y.o. male presents for evaluation of aortic stenosis.  The patient is here alone today.  He has a history of coronary artery disease and was initially diagnosed in 2015 when he presented with non-STEMI.  At that time he was found to have total occlusion of the right coronary artery with an attempted PCI but this was unsuccessful due to inability to cross the lesion.  He has been managed medically over time with no recurrence of angina.  The patient has had a lot of problems with low back pain and leg weakness.  He has been seen by multiple neurosurgeons and has undergone both low back and cervical disc surgery in the past.  An echocardiogram was performed several months ago and this demonstrated findings consistent with severe aortic stenosis, the possible presence of a bicuspid aortic valve, and suggested aortic aneurysm.  A CTA was completed and demonstrated an ascending thoracic aortic aneurysm with maximal diameter of 53 mm.  The patient complains of generalized weakness, fatigue, and shortness of breath with activity.  Difficult for him to say if his symptoms have progressed because he has significant limitation from his back.  He denies orthopnea or PND.  No heart palpitations.  No chest pain or pressure.  Past Medical History:  Diagnosis Date   Arthritis    Coronary atherosclerosis of native coronary artery    a. NSTEMI (02/2014):  LHC (02/2014):  Mild disease in LAD and CFX; prox RCA occluded with R-R collats, dist RCA filled by L-R collats, inf HK, EF 55%, LVEDP 15 mmHg.  PCI:  Unsuccessful angioplasty of RCA (late  presentation of inf MI) - tx medically.   Essential hypertension    GERD (gastroesophageal reflux disease)    Hyperlipidemia    NSTEMI (non-ST elevated myocardial infarction) (HCC) 03/11/14   Renal cell carcinoma of right kidney (HCC)    Partial nephrectomy in 2013    Past Surgical History:  Procedure Laterality Date   BACK SURGERY     1989   CERVICAL DISC SURGERY  04/29/2021   COLONOSCOPY N/A 12/11/2020   Procedure: COLONOSCOPY;  Surgeon: Franky Macho, MD;  Location: [facility];  Service: Gastroenterology;  Laterality: N/A;   LEFT HEART CATHETERIZATION WITH CORONARY ANGIOGRAM N/A 03/17/2014   Procedure: LEFT HEART CATHETERIZATION WITH CORONARY ANGIOGRAM;  Surgeon: Corky Crafts, MD;  Location: [facility] CATH LAB;  Service: Cardiovascular;  Laterality: N/A;   POLYPECTOMY  12/11/2020   Procedure: POLYPECTOMY;  Surgeon: Franky Macho, MD;  Location: [facility];  Service: Gastroenterology;;   ROBOT ASSISTED LAPAROSCOPIC NEPHRECTOMY  01/14/2012   Procedure: ROBOTIC ASSISTED LAPAROSCOPIC NEPHRECTOMY;  Surgeon: Milford Cage, MD;  Location: [facility];  Service: Urology;  Laterality: Right;  Robot Laparoscopic Right Partial Nephrectomy     TONSILLECTOMY      Current Medications: Current Meds  Medication Sig   albuterol (VENTOLIN HFA) 108 (90 Base) MCG/ACT inhaler Inhale 2 puffs into the lungs every 6 (six) hours as needed.   amLODipine (NORVASC) 5 MG tablet Take 1 tablet (5 mg total) by mouth daily.  aspirin EC 81 MG EC tablet Take 1 tablet (81 mg total) by mouth daily.   atorvastatin (LIPITOR) 80 MG tablet Take 1 tablet (80 mg total) by mouth daily.   baclofen (LIORESAL) 10 MG tablet Take 1 tablet (10 mg total) by mouth 3 (three) times daily.   finasteride (PROSCAR) 5 MG tablet Take 5 mg by mouth daily.   losartan (COZAAR) 100 MG tablet Take 1 tablet (100 mg total) by mouth daily.   metoprolol succinate (TOPROL-XL) 25 MG 24 hr tablet TAKE 1 TABLET ONCE DAILY.      Allergies:   Alcohol and Chlorthalidone   Social History   Socioeconomic History   Marital status: Married    Spouse name: Not on file   Number of children: 1   Years of education: 14   Highest education level: Not on file  Occupational History   Occupation: Retired   Tobacco Use   Smoking status: Former    Types: Cigarettes    Quit date: 11/18/1979    Years since quitting: 43.3   Smokeless tobacco: Never  Vaping Use   Vaping Use: Never used  Substance and Sexual Activity   Alcohol use: No    Alcohol/week: 0.0 standard drinks of alcohol   Drug use: Yes    Types: Marijuana   Sexual activity: Not on file  Other Topics Concern   Not on file  Social History Narrative   Lives w/ wife   Caffeine use: 1 cup coffee every morning   Right handed   Social Determinants of Health   Financial Resource Strain: Not on file  Food Insecurity: No Food Insecurity (07/01/2022)   Hunger Vital Sign    Worried About Running Out of Food in the Last Year: Never true    Ran Out of Food in the Last Year: Never true  Transportation Needs: No Transportation Needs (07/01/2022)   PRAPARE - Administrator, Civil Service (Medical): No    Lack of Transportation (Non-Medical): No  Physical Activity: Not on file  Stress: Not on file  Social Connections: Not on file     Family History: The patient's family history includes Hypertension in his daughter; Renal Disease in his daughter.  ROS:   Please see the history of present illness.    Positive for back pain, leg weakness, generalized fatigue.  All other systems reviewed and are negative.  EKGs/Labs/Other Studies Reviewed:    The following studies were reviewed today: Cardiac Studies & Procedures       ECHOCARDIOGRAM  ECHOCARDIOGRAM COMPLETE 11/18/2022  Narrative ECHOCARDIOGRAM REPORT    Patient Name:   [name] Date of Exam: 11/14/2022 Medical Rec #:  [id number]      Height:       68.0 in Accession #:    [id number]      Weight:       173.0 lb Date of Birth:  [birth year]     BSA:          1.922 m Patient Age:    66 years       BP:           136/82 mmHg Patient Gender: M              HR:           52 bpm. Exam Location:  [place]  Procedure: 2D Echo, Cardiac Doppler, Color Doppler and Strain Analysis  Indications:    R01.1 Murmur  History:  Patient has no prior history of Echocardiogram examinations. CAD, Reanl cell cancer, Aortic Valve Disease, Arrythmias:Tachycardia; Risk Factors:Hypertension, Dyslipidemia and Former Smoker.  Sonographer:    Jake Seats RDMS, RVT, RDCS Referring Phys: [id number] ELIZABETH PECK  IMPRESSIONS   1. Left ventricular ejection fraction, by estimation, is 55 to 60%. The left ventricle has normal function. The left ventricle has no regional wall motion abnormalities. There is mild left ventricular hypertrophy. Left ventricular diastolic parameters are consistent with Grade I diastolic dysfunction (impaired relaxation). The average left ventricular global longitudinal strain is -19.0 %. The global longitudinal strain is normal. 2. Right ventricular systolic function is normal. The right ventricular size is normal. Tricuspid regurgitation signal is inadequate for assessing PA pressure. 3. The mitral valve is normal in structure. Trivial mitral valve regurgitation. No evidence of mitral stenosis. 4. The tricuspid valve is abnormal. 5. The aortic valve has an indeterminant number of cusps. Aortic valve regurgitation is moderate. Severe aortic valve stenosis. 6. Limited visualization of the ascending aorta, the visualized parts are at least moderately dilated. Consider additional imaging with CTA or MRA to better evalute degree and extent of aortic aneurysm. Marland Kitchen Aortic dilatation noted. There is mild dilatation of the aortic root, measuring 42 mm. There is moderate dilatation of the ascending aorta, measuring 48 mm. 7. The inferior vena cava is normal in size with greater than 50%  respiratory variability, suggesting right atrial pressure of 3 mmHg.  FINDINGS Left Ventricle: Left ventricular ejection fraction, by estimation, is 55 to 60%. The left ventricle has normal function. The left ventricle has no regional wall motion abnormalities. The average left ventricular global longitudinal strain is -19.0 %. The global longitudinal strain is normal. The left ventricular internal cavity size was normal in size. There is mild left ventricular hypertrophy. Left ventricular diastolic parameters are consistent with Grade I diastolic dysfunction (impaired relaxation). Normal left ventricular filling pressure.  Right Ventricle: The right ventricular size is normal. Right vetricular wall thickness was not well visualized. Right ventricular systolic function is normal. Tricuspid regurgitation signal is inadequate for assessing PA pressure.  Left Atrium: Left atrial size was normal in size.  Right Atrium: Right atrial size was normal in size.  Pericardium: There is no evidence of pericardial effusion.  Mitral Valve: The mitral valve is normal in structure. Trivial mitral valve regurgitation. No evidence of mitral valve stenosis.  Tricuspid Valve: The tricuspid valve is abnormal. Tricuspid valve regurgitation is mild . No evidence of tricuspid stenosis.  Aortic Valve: The aortic valve has an indeterminant number of cusps. Aortic valve regurgitation is moderate. Severe aortic stenosis is present. Aortic valve mean gradient measures 43.0 mmHg. Aortic valve peak gradient measures 66.0 mmHg. Aortic valve area, by VTI measures 0.74 cm. Aortic valve mean gradient measures 43.0 mmHg. Aortic valve peak gradient measures 66.0 mmHg. Aortic valve area, by VTI measures 0.74 cm. DI 0.26   Pulmonic Valve: The pulmonic valve was not well visualized. Pulmonic valve regurgitation is not visualized. No evidence of pulmonic stenosis.  Aorta: Limited visualization of the ascending aorta, the  visualized parts are at least moderately dilated. Consider additional imaging with CTA or MRA to better evalute degree and extent of aortic aneurysm. Aortic dilatation noted. There is mild dilatation of the aortic root, measuring 42 mm. There is moderate dilatation of the ascending aorta, measuring 48 mm.  Venous: The inferior vena cava is normal in size with greater than 50% respiratory variability, suggesting right atrial pressure of 3 mmHg.  IAS/Shunts: No atrial  level shunt detected by color flow Doppler.   LEFT VENTRICLE PLAX 2D LVIDd:         4.50 cm     Diastology LVIDs:         3.35 cm     LV e' medial:    4.70 cm/s LV PW:         1.10 cm     LV E/e' medial:  15.1 LV IVS:        1.15 cm     LV e' lateral:   5.92 cm/s LVOT diam:     1.90 cm     LV E/e' lateral: 12.0 LV SV:         74 LV SV Index:   38          2D Longitudinal Strain LVOT Area:     2.84 cm    2D Strain GLS (A2C):   -19.4 % 2D Strain GLS (A3C):   -18.7 % 2D Strain GLS (A4C):   -18.8 % LV Volumes (MOD)           2D Strain GLS Avg:     -19.0 % LV vol d, MOD A2C: 59.7 ml LV vol d, MOD A4C: 57.2 ml LV vol s, MOD A2C: 22.8 ml LV vol s, MOD A4C: 22.0 ml LV SV MOD A2C:     36.9 ml LV SV MOD A4C:     57.2 ml LV SV MOD BP:      37.6 ml  RIGHT VENTRICLE RV S prime:     10.20 cm/s TAPSE (M-mode): 2.0 cm  LEFT ATRIUM             Index        RIGHT ATRIUM           Index LA diam:        2.90 cm 1.51 cm/m   RA Area:     12.70 cm LA Vol (A2C):   36.5 ml 18.99 ml/m  RA Volume:   26.50 ml  13.79 ml/m LA Vol (A4C):   27.9 ml 14.52 ml/m LA Biplane Vol: 31.9 ml 16.60 ml/m AORTIC VALVE AV Area (Vmax):    0.74 cm AV Area (Vmean):   0.76 cm AV Area (VTI):     0.74 cm AV Vmax:           406.33 cm/s AV Vmean:          298.333 cm/s AV VTI:            1.000 m AV Peak Grad:      66.0 mmHg AV Mean Grad:      43.0 mmHg LVOT Vmax:         106.00 cm/s LVOT Vmean:        80.000 cm/s LVOT VTI:          0.260 m LVOT/AV  VTI ratio: 0.26  AORTA Ao Root diam: 4.20 cm Ao Asc diam:  4.80 cm Ao Arch diam: 3.0 cm  MITRAL VALVE MV Area (PHT): 2.39 cm     SHUNTS MV Decel Time: 317 msec     Systemic VTI:  0.26 m MR Peak grad: 63.4 mmHg     Systemic Diam: 1.90 cm MR Vmax:      398.00 cm/s MV E velocity: 71.10 cm/s MV A velocity: 112.00 cm/s MV E/A ratio:  0.63  Dina Rich MD Electronically signed by Dina Rich MD Signature Date/Time: 11/18/2022/6:29:34 PM    Final  EKG:  EKG is ordered today.  The ekg ordered today demonstrates sinus bradycardia 51 bpm, T wave abnormality consider inferior ischemia  Recent Labs: 12/04/2022: ALT 21 03/23/2023: BUN 14; Creatinine, Ser 0.88; Hemoglobin 14.6; Platelets 240; Potassium 5.1; Sodium 141  Recent Lipid Panel    Component Value Date/Time   CHOL 139 12/04/2022 0856   TRIG 80 12/04/2022 0856   HDL 48 12/04/2022 0856   CHOLHDL 2.9 12/04/2022 0856   CHOLHDL 2.8 09/20/2020 0820   VLDL 25 05/18/2017 0913   LDLCALC 75 12/04/2022 0856   LDLCALC 59 09/20/2020 0820     Risk Assessment/Calculations:            Physical Exam:    VS:  BP (!) 130/100 Comment: Right arm 130/100  Pulse (!) 51   Ht 5\' 8"  (1.727 m)   Wt 177 lb 3.2 oz (80.4 kg)   SpO2 99%   BMI 26.94 kg/m     Wt Readings from Last 3 Encounters:  03/23/23 177 lb 3.2 oz (80.4 kg)  02/23/23 178 lb 6.4 oz (80.9 kg)  02/20/23 174 lb 6.4 oz (79.1 kg)     GEN:  Well nourished, well developed in no acute distress HEENT: Normal NECK: No JVD; No carotid bruits LYMPHATICS: No lymphadenopathy CARDIAC: RRR, 3/6 harsh crescendo decrescendo murmur at the right upper sternal border, absent A2 RESPIRATORY:  Clear to auscultation without rales, wheezing or rhonchi  ABDOMEN: Soft, non-tender, non-distended MUSCULOSKELETAL:  No edema; No deformity  SKIN: Warm and dry, mottled appearance of the thighs bilaterally NEUROLOGIC:  Alert and oriented x 3 PSYCHIATRIC:  Normal affect    ASSESSMENT:    1. Severe aortic valve stenosis   2. Pre-procedural cardiovascular examination   3. Essential hypertension    PLAN:    In order of problems listed above:  The patient appears to have severe, symptomatic aortic stenosis with NYHA functional class II limitation.  LVEF is preserved at 55 to 60% and RV function is normal.  There is no concomitant valvular disease.  The aortic valve has an indeterminate number of cusps but is likely bicuspid.  Aortic valve Doppler assessment shows a peak transaortic velocity greater than 4 m/s, peak and mean gradients of 66 and 43 mmHg, respectively, and a dimensionless index of 0.26.  I reviewed the natural history of aortic stenosis with the patient today.  With the presence of ascending aortic aneurysm, he will likely require conventional surgical aortic valve replacement and aortic aneurysm repair.  I recommended a cardiac CTA study to confirm the presence of bicuspid aortic valve and assess cardiac anatomy further, as well as the CTA of the chest, abdomen, and pelvis to evaluate his vascular anatomy further.  He does have mottling of his lower extremity skin and I think it is important to fully evaluate him for aneurysmal and atherosclerotic disease in the abdominal aorta and iliofemoral arteries.  He will also undergo cardiac catheterization to evaluate his coronary anatomy and further assess for obstructive CAD with his known history of CAD and prior MI. I have reviewed the risks, indications, and alternatives to cardiac catheterization, possible angioplasty, and stenting with the patient. Risks include but are not limited to bleeding, infection, vascular injury, stroke, myocardial infection, arrhythmia, kidney injury, radiation-related injury in the case of prolonged fluoroscopy use, emergency cardiac surgery, and death. The patient understands the risks of serious complication is 1-2 in 1000 with diagnostic cardiac cath and 1-2% or less with  angioplasty/stenting.  Once his cardiac catheterization  and CTA studies are completed, he will be referred for formal cardiac surgical consultation. As above Patient needs better blood pressure control.  Add amlodipine 5 mg daily and continue close clinical follow-up.      Shared Decision Making/Informed Consent The risks [stroke (1 in 1000), death (1 in 1000), kidney failure [usually temporary] (1 in 500), bleeding (1 in 200), allergic reaction [possibly serious] (1 in 200)], benefits (diagnostic support and management of coronary artery disease) and alternatives of a cardiac catheterization were discussed in detail with Mr. Forcier and he is willing to proceed.   Limited by low back/legs Medication Adjustments/Labs and Tests Ordered: Current medicines are reviewed at length with the patient today.  Concerns regarding medicines are outlined above.  Orders Placed This Encounter  Procedures   CBC   Basic metabolic panel   EKG 12-Lead   Meds ordered this encounter  Medications   amLODipine (NORVASC) 5 MG tablet    Sig: Take 1 tablet (5 mg total) by mouth daily.    Dispense:  90 tablet    Refill:  3    Patient Instructions  Medication Instructions:  START Amlodipine 5mg  daily *If you need a refill on your cardiac medications before your next appointment, please call your pharmacy*   Lab Work: CBC, BMET today If you have labs (blood work) drawn today and your tests are completely normal, you will receive your results only by: MyChart Message (if you have MyChart) OR A paper copy in the mail If you have any lab test that is abnormal or we need to change your treatment, we will call you to review the results.   Testing/Procedures: R & L heart catheterization Your physician has requested that you have a cardiac catheterization. Cardiac catheterization is used to diagnose and/or treat various heart conditions. Doctors may recommend this procedure for a number of different reasons. The  most common reason is to evaluate chest pain. Chest pain can be a symptom of coronary artery disease (CAD), and cardiac catheterization can show whether plaque is narrowing or blocking your heart's arteries. This procedure is also used to evaluate the valves, as well as measure the blood flow and oxygen levels in different parts of your heart. For further information please visit [email or web address] Please follow instruction sheet, as given.  Follow-Up: At [facility], you and your health needs are our priority.  As part of our continuing mission to provide you with exceptional heart care, we have created designated Provider Care Teams.  These Care Teams include your primary Cardiologist (physician) and Advanced Practice Providers (APPs -  Physician Assistants and Nurse Practitioners) who all work together to provide you with the care you need, when you need it.  Your next appointment:   Structural Team will follow-up  Provider:   Tonny Bollman, MD      Other Instructions       Cardiac/Peripheral Catheterization   You are scheduled for a Cardiac Catheterization on Friday, May 17 with Dr. Tonny Bollman.  1. Please arrive at the [facility] (Main Entrance A) at [facility]: [address] [place], [place] [address] at 7:00am (This time is two hour(s) before your procedure to ensure your preparation). Free valet parking service is available. You will check in at ADMITTING. The support person will be asked to wait in the waiting room.  It is OK to have someone drop you off and come back when you are ready to be discharged.  Special note: Every effort is made to have your procedure done on time. Please understand that emergencies sometimes delay scheduled procedures.  2. Diet: Do not eat solid foods after midnight.  You may have clear liquids until 5 AM the day of the procedure.  3. Labs: TODAY  4. Medication instructions in preparation for your procedure:    Contrast Allergy: No  On the morning of your procedure, take Aspirin 81 mg and any morning medicines NOT listed above.  You may use sips of water.  5. Plan to go home the same day, you will only stay overnight if medically necessary. 6. You MUST have a responsible adult to drive you home. 7. An adult MUST be with you the first 24 hours after you arrive home. 8. Bring a current list of your medications, and the last time and date medication taken. 9. Bring ID and current insurance cards. 10.Please wear clothes that are easy to get on and off and wear slip-on shoes.  Thank you for allowing [place] to care for you!   -- [facility] Cardi[facility] services    Signed, Tonny Bollman, MD  03/27/2023 12:41 PM    [facility]

## 2023-03-23 NOTE — H&P (View-Only) (Signed)
Cardiology Office Note:    Date:  03/27/2023   ID:  [name], DOB 05/09/1956, MRN [id number]  PCP:  [facility]   [facility] Providers Cardiologist:  Curtis McDowell, MD     Referring MD: [facility]   Chief Complaint  Patient presents with   Aortic Stenosis    History of Present Illness:    [name] is a 66 y.o. male presents for evaluation of aortic stenosis.  The patient is here alone today.  He has a history of coronary artery disease and was initially diagnosed in 2015 when he presented with non-STEMI.  At that time he was found to have total occlusion of the right coronary artery with an attempted PCI but this was unsuccessful due to inability to cross the lesion.  He has been managed medically over time with no recurrence of angina.  The patient has had a lot of problems with low back pain and leg weakness.  He has been seen by multiple neurosurgeons and has undergone both low back and cervical disc surgery in the past.  An echocardiogram was performed several months ago and this demonstrated findings consistent with severe aortic stenosis, the possible presence of a bicuspid aortic valve, and suggested aortic aneurysm.  A CTA was completed and demonstrated an ascending thoracic aortic aneurysm with maximal diameter of 53 mm.  The patient complains of generalized weakness, fatigue, and shortness of breath with activity.  Difficult for him to say if his symptoms have progressed because he has significant limitation from his back.  He denies orthopnea or PND.  No heart palpitations.  No chest pain or pressure.  Past Medical History:  Diagnosis Date   Arthritis    Coronary atherosclerosis of native coronary artery    a. NSTEMI (02/2014):  LHC (02/2014):  Mild disease in LAD and CFX; prox RCA occluded with R-R collats, dist RCA filled by L-R collats, inf HK, EF 55%, LVEDP 15 mmHg.  PCI:  Unsuccessful angioplasty of RCA (late  presentation of inf MI) - tx medically.   Essential hypertension    GERD (gastroesophageal reflux disease)    Hyperlipidemia    NSTEMI (non-ST elevated myocardial infarction) (HCC) 03/11/14   Renal cell carcinoma of right kidney (HCC)    Partial nephrectomy in 2013    Past Surgical History:  Procedure Laterality Date   BACK SURGERY     1989   CERVICAL DISC SURGERY  04/29/2021   COLONOSCOPY N/A 12/11/2020   Procedure: COLONOSCOPY;  Surgeon: Jenkins, Mark, MD;  Location: [facility];  Service: Gastroenterology;  Laterality: N/A;   LEFT HEART CATHETERIZATION WITH CORONARY ANGIOGRAM N/A 03/17/2014   Procedure: LEFT HEART CATHETERIZATION WITH CORONARY ANGIOGRAM;  Surgeon: Curtis S Varanasi, MD;  Location: [facility];  Service: Cardiovascular;  Laterality: N/A;   POLYPECTOMY  12/11/2020   Procedure: POLYPECTOMY;  Surgeon: Jenkins, Mark, MD;  Location: [facility];  Service: Gastroenterology;;   ROBOT ASSISTED LAPAROSCOPIC NEPHRECTOMY  01/14/2012   Procedure: ROBOTIC ASSISTED LAPAROSCOPIC NEPHRECTOMY;  Surgeon: Curtis Young Woodruff, MD;  Location: [facility];  Service: Urology;  Laterality: Right;  Robot Laparoscopic Right Partial Nephrectomy     TONSILLECTOMY      Current Medications: Current Meds  Medication Sig   albuterol (VENTOLIN HFA) 108 (90 Base) MCG/ACT inhaler Inhale 2 puffs into the lungs every 6 (six) hours as needed.   amLODipine (NORVASC) 5 MG tablet Take 1 tablet (5 mg total) by mouth daily.     aspirin EC 81 MG EC tablet Take 1 tablet (81 mg total) by mouth daily.   atorvastatin (LIPITOR) 80 MG tablet Take 1 tablet (80 mg total) by mouth daily.   baclofen (LIORESAL) 10 MG tablet Take 1 tablet (10 mg total) by mouth 3 (three) times daily.   finasteride (PROSCAR) 5 MG tablet Take 5 mg by mouth daily.   losartan (COZAAR) 100 MG tablet Take 1 tablet (100 mg total) by mouth daily.   metoprolol succinate (TOPROL-XL) 25 MG 24 hr tablet TAKE 1 TABLET ONCE DAILY.      Allergies:   Alcohol and Chlorthalidone   Social History   Socioeconomic History   Marital status: Married    Spouse name: Not on file   Number of children: 1   Years of education: 14   Highest education level: Not on file  Occupational History   Occupation: Retired   Tobacco Use   Smoking status: Former    Types: Cigarettes    Quit date: 11/18/1979    Years since quitting: 43.3   Smokeless tobacco: Never  Vaping Use   Vaping Use: Never used  Substance and Sexual Activity   Alcohol use: No    Alcohol/week: 0.0 standard drinks of alcohol   Drug use: Yes    Types: Marijuana   Sexual activity: Not on file  Other Topics Concern   Not on file  Social History Narrative   Lives w/ wife   Caffeine use: 1 cup coffee every morning   Right handed   Social Determinants of Health   Financial Resource Strain: Not on file  Food Insecurity: No Food Insecurity (07/01/2022)   Hunger Vital Sign    Worried About Running Out of Food in the Last Year: Never true    Ran Out of Food in the Last Year: Never true  Transportation Needs: No Transportation Needs (07/01/2022)   PRAPARE - Transportation    Lack of Transportation (Medical): No    Lack of Transportation (Non-Medical): No  Physical Activity: Not on file  Stress: Not on file  Social Connections: Not on file     Family History: The patient's family history includes Hypertension in his daughter; Renal Disease in his daughter.  ROS:   Please see the history of present illness.    Positive for back pain, leg weakness, generalized fatigue.  All other systems reviewed and are negative.  EKGs/Labs/Other Studies Reviewed:    The following studies were reviewed today: Cardiac Studies & Procedures       ECHOCARDIOGRAM  ECHOCARDIOGRAM COMPLETE 11/18/2022  Narrative ECHOCARDIOGRAM REPORT    Patient Name:   [name] Date of Exam: 11/14/2022 Medical Rec #:  [id number]      Height:       68.0 in Accession #:    [id number]      Weight:       173.0 lb Date of Birth:  11/12/1956     BSA:          1.922 m Patient Age:    66 years       BP:           136/82 mmHg Patient Gender: M              HR:           52 bpm. Exam Location:  [facility]  Procedure: 2D Echo, Cardiac Doppler, Color Doppler and Strain Analysis  Indications:    R01.1 Murmur  History:          Patient has no prior history of Echocardiogram examinations. CAD, Reanl cell cancer, Aortic Valve Disease, Arrythmias:Tachycardia; Risk Factors:Hypertension, Dyslipidemia and Former Smoker.  Sonographer:    Amanda McFatter RDMS, RVT, RDCS Referring Phys: [id number] ELIZABETH PECK  IMPRESSIONS   1. Left ventricular ejection fraction, by estimation, is 55 to 60%. The left ventricle has normal function. The left ventricle has no regional wall motion abnormalities. There is mild left ventricular hypertrophy. Left ventricular diastolic parameters are consistent with Grade I diastolic dysfunction (impaired relaxation). The average left ventricular global longitudinal strain is -19.0 %. The global longitudinal strain is normal. 2. Right ventricular systolic function is normal. The right ventricular size is normal. Tricuspid regurgitation signal is inadequate for assessing PA pressure. 3. The mitral valve is normal in structure. Trivial mitral valve regurgitation. No evidence of mitral stenosis. 4. The tricuspid valve is abnormal. 5. The aortic valve has an indeterminant number of cusps. Aortic valve regurgitation is moderate. Severe aortic valve stenosis. 6. Limited visualization of the ascending aorta, the visualized parts are at least moderately dilated. Consider additional imaging with CTA or MRA to better evalute degree and extent of aortic aneurysm. . Aortic dilatation noted. There is mild dilatation of the aortic root, measuring 42 mm. There is moderate dilatation of the ascending aorta, measuring 48 mm. 7. The inferior vena cava is normal in size with greater than 50%  respiratory variability, suggesting right atrial pressure of 3 mmHg.  FINDINGS Left Ventricle: Left ventricular ejection fraction, by estimation, is 55 to 60%. The left ventricle has normal function. The left ventricle has no regional wall motion abnormalities. The average left ventricular global longitudinal strain is -19.0 %. The global longitudinal strain is normal. The left ventricular internal cavity size was normal in size. There is mild left ventricular hypertrophy. Left ventricular diastolic parameters are consistent with Grade I diastolic dysfunction (impaired relaxation). Normal left ventricular filling pressure.  Right Ventricle: The right ventricular size is normal. Right vetricular wall thickness was not well visualized. Right ventricular systolic function is normal. Tricuspid regurgitation signal is inadequate for assessing PA pressure.  Left Atrium: Left atrial size was normal in size.  Right Atrium: Right atrial size was normal in size.  Pericardium: There is no evidence of pericardial effusion.  Mitral Valve: The mitral valve is normal in structure. Trivial mitral valve regurgitation. No evidence of mitral valve stenosis.  Tricuspid Valve: The tricuspid valve is abnormal. Tricuspid valve regurgitation is mild . No evidence of tricuspid stenosis.  Aortic Valve: The aortic valve has an indeterminant number of cusps. Aortic valve regurgitation is moderate. Severe aortic stenosis is present. Aortic valve mean gradient measures 43.0 mmHg. Aortic valve peak gradient measures 66.0 mmHg. Aortic valve area, by VTI measures 0.74 cm. Aortic valve mean gradient measures 43.0 mmHg. Aortic valve peak gradient measures 66.0 mmHg. Aortic valve area, by VTI measures 0.74 cm. DI 0.26   Pulmonic Valve: The pulmonic valve was not well visualized. Pulmonic valve regurgitation is not visualized. No evidence of pulmonic stenosis.  Aorta: Limited visualization of the ascending aorta, the  visualized parts are at least moderately dilated. Consider additional imaging with CTA or MRA to better evalute degree and extent of aortic aneurysm. Aortic dilatation noted. There is mild dilatation of the aortic root, measuring 42 mm. There is moderate dilatation of the ascending aorta, measuring 48 mm.  Venous: The inferior vena cava is normal in size with greater than 50% respiratory variability, suggesting right atrial pressure of 3 mmHg.  IAS/Shunts: No atrial   level shunt detected by color flow Doppler.   LEFT VENTRICLE PLAX 2D LVIDd:         4.50 cm     Diastology LVIDs:         3.35 cm     LV e' medial:    4.70 cm/s LV PW:         1.10 cm     LV E/e' medial:  15.1 LV IVS:        1.15 cm     LV e' lateral:   5.92 cm/s LVOT diam:     1.90 cm     LV E/e' lateral: 12.0 LV SV:         74 LV SV Index:   38          2D Longitudinal Strain LVOT Area:     2.84 cm    2D Strain GLS (A2C):   -19.4 % 2D Strain GLS (A3C):   -18.7 % 2D Strain GLS (A4C):   -18.8 % LV Volumes (MOD)           2D Strain GLS Avg:     -19.0 % LV vol d, MOD A2C: 59.7 ml LV vol d, MOD A4C: 57.2 ml LV vol s, MOD A2C: 22.8 ml LV vol s, MOD A4C: 22.0 ml LV SV MOD A2C:     36.9 ml LV SV MOD A4C:     57.2 ml LV SV MOD BP:      37.6 ml  RIGHT VENTRICLE RV S prime:     10.20 cm/s TAPSE (M-mode): 2.0 cm  LEFT ATRIUM             Index        RIGHT ATRIUM           Index LA diam:        2.90 cm 1.51 cm/m   RA Area:     12.70 cm LA Vol (A2C):   36.5 ml 18.99 ml/m  RA Volume:   26.50 ml  13.79 ml/m LA Vol (A4C):   27.9 ml 14.52 ml/m LA Biplane Vol: 31.9 ml 16.60 ml/m AORTIC VALVE AV Area (Vmax):    0.74 cm AV Area (Vmean):   0.76 cm AV Area (VTI):     0.74 cm AV Vmax:           406.33 cm/s AV Vmean:          298.333 cm/s AV VTI:            1.000 m AV Peak Grad:      66.0 mmHg AV Mean Grad:      43.0 mmHg LVOT Vmax:         106.00 cm/s LVOT Vmean:        80.000 cm/s LVOT VTI:          0.260 m LVOT/AV  VTI ratio: 0.26  AORTA Ao Root diam: 4.20 cm Ao Asc diam:  4.80 cm Ao Arch diam: 3.0 cm  MITRAL VALVE MV Area (PHT): 2.39 cm     SHUNTS MV Decel Time: 317 msec     Systemic VTI:  0.26 m MR Peak grad: 63.4 mmHg     Systemic Diam: 1.90 cm MR Vmax:      398.00 cm/s MV E velocity: 71.10 cm/s MV A velocity: 112.00 cm/s MV E/A ratio:  0.63  Jonathan Branch MD Electronically signed by Jonathan Branch MD Signature Date/Time: 11/18/2022/6:29:34 PM    Final                EKG:  EKG is ordered today.  The ekg ordered today demonstrates sinus bradycardia 51 bpm, T wave abnormality consider inferior ischemia  Recent Labs: 12/04/2022: ALT 21 03/23/2023: BUN 14; Creatinine, Ser 0.88; Hemoglobin 14.6; Platelets 240; Potassium 5.1; Sodium 141  Recent Lipid Panel    Component Value Date/Time   CHOL 139 12/04/2022 0856   TRIG 80 12/04/2022 0856   HDL 48 12/04/2022 0856   CHOLHDL 2.9 12/04/2022 0856   CHOLHDL 2.8 09/20/2020 0820   VLDL 25 05/18/2017 0913   LDLCALC 75 12/04/2022 0856   LDLCALC 59 09/20/2020 0820     Risk Assessment/Calculations:            Physical Exam:    VS:  BP (!) 130/100 Comment: Right arm 130/100  Pulse (!) 51   Ht 5' 8" (1.727 m)   Wt 177 lb 3.2 oz (80.4 kg)   SpO2 99%   BMI 26.94 kg/m     Wt Readings from Last 3 Encounters:  03/23/23 177 lb 3.2 oz (80.4 kg)  02/23/23 178 lb 6.4 oz (80.9 kg)  02/20/23 174 lb 6.4 oz (79.1 kg)     GEN:  Well nourished, well developed in no acute distress HEENT: Normal NECK: No JVD; No carotid bruits LYMPHATICS: No lymphadenopathy CARDIAC: RRR, 3/6 harsh crescendo decrescendo murmur at the right upper sternal border, absent A2 RESPIRATORY:  Clear to auscultation without rales, wheezing or rhonchi  ABDOMEN: Soft, non-tender, non-distended MUSCULOSKELETAL:  No edema; No deformity  SKIN: Warm and dry, mottled appearance of the thighs bilaterally NEUROLOGIC:  Alert and oriented x 3 PSYCHIATRIC:  Normal affect    ASSESSMENT:    1. Severe aortic valve stenosis   2. Pre-procedural cardiovascular examination   3. Essential hypertension    PLAN:    In order of problems listed above:  The patient appears to have severe, symptomatic aortic stenosis with NYHA functional class II limitation.  LVEF is preserved at 55 to 60% and RV function is normal.  There is no concomitant valvular disease.  The aortic valve has an indeterminate number of cusps but is likely bicuspid.  Aortic valve Doppler assessment shows a peak transaortic velocity greater than 4 m/s, peak and mean gradients of 66 and 43 mmHg, respectively, and a dimensionless index of 0.26.  I reviewed the natural history of aortic stenosis with the patient today.  With the presence of ascending aortic aneurysm, he will likely require conventional surgical aortic valve replacement and aortic aneurysm repair.  I recommended a cardiac CTA study to confirm the presence of bicuspid aortic valve and assess cardiac anatomy further, as well as the CTA of the chest, abdomen, and pelvis to evaluate his vascular anatomy further.  He does have mottling of his lower extremity skin and I think it is important to fully evaluate him for aneurysmal and atherosclerotic disease in the abdominal aorta and iliofemoral arteries.  He will also undergo cardiac catheterization to evaluate his coronary anatomy and further assess for obstructive CAD with his known history of CAD and prior MI. I have reviewed the risks, indications, and alternatives to cardiac catheterization, possible angioplasty, and stenting with the patient. Risks include but are not limited to bleeding, infection, vascular injury, stroke, myocardial infection, arrhythmia, kidney injury, radiation-related injury in the case of prolonged fluoroscopy use, emergency cardiac surgery, and death. The patient understands the risks of serious complication is 1-2 in 1000 with diagnostic cardiac cath and 1-2% or less with  angioplasty/stenting.  Once his cardiac catheterization   and CTA studies are completed, he will be referred for formal cardiac surgical consultation. As above Patient needs better blood pressure control.  Add amlodipine 5 mg daily and continue close clinical follow-up.      Shared Decision Making/Informed Consent The risks [stroke (1 in 1000), death (1 in 1000), kidney failure [usually temporary] (1 in 500), bleeding (1 in 200), allergic reaction [possibly serious] (1 in 200)], benefits (diagnostic support and management of coronary artery disease) and alternatives of a cardiac catheterization were discussed in detail with Mr. Corzine and he is willing to proceed.   Limited by low back/legs Medication Adjustments/Labs and Tests Ordered: Current medicines are reviewed at length with the patient today.  Concerns regarding medicines are outlined above.  Orders Placed This Encounter  Procedures   CBC   Basic metabolic panel   EKG 12-Lead   Meds ordered this encounter  Medications   amLODipine (NORVASC) 5 MG tablet    Sig: Take 1 tablet (5 mg total) by mouth daily.    Dispense:  90 tablet    Refill:  3    Patient Instructions  Medication Instructions:  START Amlodipine 5mg daily *If you need a refill on your cardiac medications before your next appointment, please call your pharmacy*   Lab Work: CBC, BMET today If you have labs (blood work) drawn today and your tests are completely normal, you will receive your results only by: MyChart Message (if you have MyChart) OR A paper copy in the mail If you have any lab test that is abnormal or we need to change your treatment, we will call you to review the results.   Testing/Procedures: R & L heart catheterization Your physician has requested that you have a cardiac catheterization. Cardiac catheterization is used to diagnose and/or treat various heart conditions. Doctors may recommend this procedure for a number of different reasons. The  most common reason is to evaluate chest pain. Chest pain can be a symptom of coronary artery disease (CAD), and cardiac catheterization can show whether plaque is narrowing or blocking your heart's arteries. This procedure is also used to evaluate the valves, as well as measure the blood flow and oxygen levels in different parts of your heart. For further information please visit www.cardiosmart.org. Please follow instruction sheet, as given.  Follow-Up: At [facility], you and your health needs are our priority.  As part of our continuing mission to provide you with exceptional heart care, we have created designated Provider Care Teams.  These Care Teams include your primary Cardiologist (physician) and Advanced Practice Providers (APPs -  Physician Assistants and Nurse Practitioners) who all work together to provide you with the care you need, when you need it.  Your next appointment:   Structural Team will follow-up  Provider:   Shay Jhaveri, MD      Other Instructions       Cardiac/Peripheral Catheterization   You are scheduled for a Cardiac Catheterization on Friday, May 17 with Dr. Donyell Carrell.  1. Please arrive at the [facility] (Main Entrance A) at [facility]: [address] [place], [place] [address] at 7:00am (This time is two hour(s) before your procedure to ensure your preparation). Free valet parking service is available. You will check in at ADMITTING. The support person will be asked to wait in the waiting room.  It is OK to have someone drop you off and come back when you are ready to be discharged.          Special note: Every effort is made to have your procedure done on time. Please understand that emergencies sometimes delay scheduled procedures.  2. Diet: Do not eat solid foods after midnight.  You may have clear liquids until 5 AM the day of the procedure.  3. Labs: TODAY  4. Medication instructions in preparation for your procedure:    Contrast Allergy: No  On the morning of your procedure, take Aspirin 81 mg and any morning medicines NOT listed above.  You may use sips of water.  5. Plan to go home the same day, you will only stay overnight if medically necessary. 6. You MUST have a responsible adult to drive you home. 7. An adult MUST be with you the first 24 hours after you arrive home. 8. Bring a current list of your medications, and the last time and date medication taken. 9. Bring ID and current insurance cards. 10.Please wear clothes that are easy to get on and off and wear slip-on shoes.  Thank you for allowing us to care for you!   -- [facility]    Signed, Dorissa Stinnette, MD  03/27/2023 12:41 PM    [facility]  

## 2023-03-24 LAB — CBC
Hematocrit: 44.7 % (ref 37.5–51.0)
Hemoglobin: 14.6 g/dL (ref 13.0–17.7)
MCH: 29.9 pg (ref 26.6–33.0)
MCHC: 32.7 g/dL (ref 31.5–35.7)
MCV: 92 fL (ref 79–97)
Platelets: 240 10*3/uL (ref 150–450)
RBC: 4.88 x10E6/uL (ref 4.14–5.80)
RDW: 13.1 % (ref 11.6–15.4)
WBC: 8 10*3/uL (ref 3.4–10.8)

## 2023-03-24 LAB — BASIC METABOLIC PANEL
BUN/Creatinine Ratio: 16 (ref 10–24)
BUN: 14 mg/dL (ref 8–27)
CO2: 22 mmol/L (ref 20–29)
Calcium: 9.3 mg/dL (ref 8.6–10.2)
Chloride: 96 mmol/L (ref 96–106)
Creatinine, Ser: 0.88 mg/dL (ref 0.76–1.27)
Glucose: 89 mg/dL (ref 70–99)
Potassium: 5.1 mmol/L (ref 3.5–5.2)
Sodium: 141 mmol/L (ref 134–144)
eGFR: 94 mL/min/{1.73_m2} (ref >59)

## 2023-03-25 ENCOUNTER — Encounter: Payer: Self-pay | Admitting: Cardiovascular Disease

## 2023-03-27 ENCOUNTER — Encounter: Payer: Self-pay | Admitting: Cardiovascular Disease

## 2023-04-01 ENCOUNTER — Telehealth: Payer: Self-pay | Admitting: *Deleted

## 2023-04-01 NOTE — Telephone Encounter (Signed)
Cardiac Catheterization scheduled at [facility] for: Friday Apr 03, 2023 9 AM Arrival time [facility] Main Entrance A at: 7 AM  Nothing to eat after midnight prior to procedure, clear liquids until 5 AM day of procedure.  Medication instructions: -Usual morning medications can be taken with sips of water including aspirin 81 mg.  Confirmed patient has responsible adult to drive home post procedure and be with patient first 24 hours after arriving home.  Plan to go home the same day, you will only stay overnight if medically necessary.  Reviewed procedure instructions with patient.

## 2023-04-02 ENCOUNTER — Telehealth: Payer: Self-pay | Admitting: *Deleted

## 2023-04-02 NOTE — Telephone Encounter (Signed)
Cardiac Catheterization scheduled at [facility] for: Friday Apr 03, 2023 9 AM Arrival time [facility] Main Entrance A at: 7 AM  Nothing to eat after midnight prior to procedure, clear liquids until 5 AM day of procedure.  Medication instructions: -Usual morning medications can be taken with sips of water including aspirin 81 mg.  Confirmed patient has responsible adult to drive home post procedure and be with patient first 24 hours after arriving home.  Plan to go home the same day, you will only stay overnight if medically necessary.  Reviewed procedure instructions with patient.      

## 2023-04-03 ENCOUNTER — Other Ambulatory Visit: Payer: Self-pay

## 2023-04-03 ENCOUNTER — Ambulatory Visit (HOSPITAL_COMMUNITY)
Admission: RE | Admit: 2023-04-03 | Discharge: 2023-04-03 | Disposition: A | Discharge status: Home / Self Care | Payer: Medicare Other | Source: Ambulatory Visit | Attending: Cardiovascular Disease | Admitting: Cardiovascular Disease

## 2023-04-03 ENCOUNTER — Encounter (HOSPITAL_COMMUNITY)
Admission: RE | Disposition: A | Discharge status: Home / Self Care | Payer: Self-pay | Source: Ambulatory Visit | Attending: Cardiovascular Disease

## 2023-04-03 DIAGNOSIS — I252 Old myocardial infarction: Secondary | ICD-10-CM | POA: Insufficient documentation

## 2023-04-03 DIAGNOSIS — I10 Essential (primary) hypertension: Secondary | ICD-10-CM | POA: Insufficient documentation

## 2023-04-03 DIAGNOSIS — I350 Nonrheumatic aortic (valve) stenosis: Secondary | ICD-10-CM | POA: Diagnosis not present

## 2023-04-03 DIAGNOSIS — I2510 Atherosclerotic heart disease of native coronary artery without angina pectoris: Secondary | ICD-10-CM | POA: Diagnosis not present

## 2023-04-03 DIAGNOSIS — I7121 Aneurysm of the ascending aorta, without rupture: Secondary | ICD-10-CM

## 2023-04-03 DIAGNOSIS — Z79899 Other long term (current) drug therapy: Secondary | ICD-10-CM | POA: Insufficient documentation

## 2023-04-03 DIAGNOSIS — I2582 Chronic total occlusion of coronary artery: Secondary | ICD-10-CM | POA: Insufficient documentation

## 2023-04-03 DIAGNOSIS — Z87891 Personal history of nicotine dependence: Secondary | ICD-10-CM | POA: Insufficient documentation

## 2023-04-03 DIAGNOSIS — Q231 Congenital insufficiency of aortic valve: Secondary | ICD-10-CM

## 2023-04-03 HISTORY — PX: RIGHT HEART CATH AND CORONARY ANGIOGRAPHY: CATH118264

## 2023-04-03 LAB — POCT I-STAT 7, (LYTES, BLD GAS, ICA,H+H)
Acid-base deficit: 3 mmol/L — ABNORMAL HIGH (ref 0.0–2.0)
Bicarbonate: 21.2 mmol/L (ref 20.0–28.0)
Calcium, Ion: 1.18 mmol/L (ref 1.15–1.40)
HCT: 41 % (ref 39.0–52.0)
Hemoglobin: 13.9 g/dL (ref 13.0–17.0)
O2 Saturation: 97 %
Potassium: 4.1 mmol/L (ref 3.5–5.1)
Sodium: 139 mmol/L (ref 135–145)
TCO2: 22 mmol/L (ref 22–32)
pCO2 arterial: 33.3 mmHg (ref 32–48)
pH, Arterial: 7.411 (ref 7.35–7.45)
pO2, Arterial: 92 mmHg (ref 83–108)

## 2023-04-03 LAB — POCT I-STAT EG7
Acid-base deficit: 2 mmol/L (ref 0.0–2.0)
Bicarbonate: 22.1 mmol/L (ref 20.0–28.0)
Calcium, Ion: 1.2 mmol/L (ref 1.15–1.40)
HCT: 42 % (ref 39.0–52.0)
Hemoglobin: 14.3 g/dL (ref 13.0–17.0)
O2 Saturation: 75 %
Potassium: 4.1 mmol/L (ref 3.5–5.1)
Sodium: 139 mmol/L (ref 135–145)
TCO2: 23 mmol/L (ref 22–32)
pCO2, Ven: 36.6 mmHg — ABNORMAL LOW (ref 44–60)
pH, Ven: 7.389 (ref 7.25–7.43)
pO2, Ven: 40 mmHg (ref 32–45)

## 2023-04-03 SURGERY — RIGHT HEART CATH AND CORONARY ANGIOGRAPHY
Anesthesia: LOCAL

## 2023-04-03 MED ORDER — HYDRALAZINE HCL 20 MG/ML IJ SOLN: 10.0000 mg | INTRAMUSCULAR | Status: DC | PRN

## 2023-04-03 MED ORDER — LIDOCAINE HCL (PF) 1 % IJ SOLN
INTRAMUSCULAR | Status: AC
  Filled 2023-04-03: qty 30

## 2023-04-03 MED ORDER — SODIUM CHLORIDE 0.9 % WEIGHT BASED INFUSION: 1.0000 mL/kg/h | INTRAVENOUS | Status: DC

## 2023-04-03 MED ORDER — LABETALOL HCL 5 MG/ML IV SOLN: 10.0000 mg | INTRAVENOUS | Status: DC | PRN

## 2023-04-03 MED ORDER — IOHEXOL 350 MG/ML SOLN
INTRAVENOUS | Status: DC | PRN
  Administered 2023-04-03: 40 mL via INTRA_ARTERIAL

## 2023-04-03 MED ORDER — ONDANSETRON HCL 4 MG/2ML IJ SOLN: 4.0000 mg | Freq: Four times a day (QID) | INTRAMUSCULAR | Status: DC | PRN

## 2023-04-03 MED ORDER — SODIUM CHLORIDE 0.9% FLUSH: 3.0000 mL | Freq: Two times a day (BID) | INTRAVENOUS | Status: DC

## 2023-04-03 MED ORDER — FENTANYL CITRATE (PF) 100 MCG/2ML IJ SOLN
INTRAMUSCULAR | Status: DC | PRN
  Administered 2023-04-03: 25 ug via INTRAVENOUS

## 2023-04-03 MED ORDER — VERAPAMIL HCL 2.5 MG/ML IV SOLN
INTRAVENOUS | Status: DC | PRN
  Administered 2023-04-03: 10 mL via INTRA_ARTERIAL

## 2023-04-03 MED ORDER — HEPARIN SODIUM (PORCINE) 1000 UNIT/ML IJ SOLN
INTRAMUSCULAR | Status: DC | PRN
  Administered 2023-04-03: 4000 [IU] via INTRAVENOUS

## 2023-04-03 MED ORDER — DIAZEPAM 5 MG PO TABS: 5.0000 mg | ORAL_TABLET | Freq: Three times a day (TID) | ORAL | Status: DC | PRN

## 2023-04-03 MED ORDER — ASPIRIN 81 MG PO CHEW: 81.0000 mg | CHEWABLE_TABLET | ORAL | Status: DC

## 2023-04-03 MED ORDER — FENTANYL CITRATE (PF) 100 MCG/2ML IJ SOLN
INTRAMUSCULAR | Status: AC
  Filled 2023-04-03: qty 2

## 2023-04-03 MED ORDER — SODIUM CHLORIDE 0.9 % WEIGHT BASED INFUSION: 3.0000 mL/kg/h | INTRAVENOUS | Status: AC

## 2023-04-03 MED ORDER — MIDAZOLAM HCL 2 MG/2ML IJ SOLN
INTRAMUSCULAR | Status: DC | PRN
  Administered 2023-04-03: 2 mg via INTRAVENOUS

## 2023-04-03 MED ORDER — MIDAZOLAM HCL 2 MG/2ML IJ SOLN
INTRAMUSCULAR | Status: AC
  Filled 2023-04-03: qty 2

## 2023-04-03 MED ORDER — LIDOCAINE HCL (PF) 1 % IJ SOLN
INTRAMUSCULAR | Status: DC | PRN
  Administered 2023-04-03 (×2): 2 mL

## 2023-04-03 MED ORDER — SODIUM CHLORIDE 0.9 % IV SOLN: 250.0000 mL | INTRAVENOUS | Status: DC | PRN

## 2023-04-03 MED ORDER — SODIUM CHLORIDE 0.9% FLUSH: 3.0000 mL | INTRAVENOUS | Status: DC | PRN

## 2023-04-03 MED ORDER — HEPARIN SODIUM (PORCINE) 1000 UNIT/ML IJ SOLN
INTRAMUSCULAR | Status: AC
  Filled 2023-04-03: qty 10

## 2023-04-03 MED ORDER — VERAPAMIL HCL 2.5 MG/ML IV SOLN
INTRAVENOUS | Status: AC
  Filled 2023-04-03: qty 2

## 2023-04-03 MED ORDER — HEPARIN (PORCINE) IN NACL 1000-0.9 UT/500ML-% IV SOLN
INTRAVENOUS | Status: DC | PRN
  Administered 2023-04-03 (×2): 500 mL

## 2023-04-03 MED ORDER — ACETAMINOPHEN 325 MG PO TABS: 650.0000 mg | ORAL_TABLET | ORAL | Status: DC | PRN

## 2023-04-03 SURGICAL SUPPLY — 11 items
CATH BALLN WEDGE 5F 110CM (CATHETERS) IMPLANT
CATH INFINITI 5FR MULTPACK ANG (CATHETERS) IMPLANT
DEVICE RAD COMP TR BAND LRG (VASCULAR PRODUCTS) IMPLANT
GLIDESHEATH SLEND SS 6F .021 (SHEATH) IMPLANT
GUIDEWIRE INQWIRE 1.5J.035X260 (WIRE) IMPLANT
INQWIRE 1.5J .035X260CM (WIRE) ×1
KIT HEART LEFT (KITS) ×1 IMPLANT
PACK CARDIAC CATHETERIZATION (CUSTOM PROCEDURE TRAY) ×1 IMPLANT
SHEATH GLIDE SLENDER 4/5FR (SHEATH) IMPLANT
TRANSDUCER W/STOPCOCK (MISCELLANEOUS) ×1 IMPLANT
TUBING CIL FLEX 10 FLL-RA (TUBING) ×1 IMPLANT

## 2023-04-03 NOTE — Interval H&P Note (Signed)
History and Physical Interval Note:  04/03/2023 9:50 AM  [name]  has presented today for surgery, with the diagnosis of aortic stenosis.  The various methods of treatment have been discussed with the patient and family. After consideration of risks, benefits and other options for treatment, the patient has consented to  Procedure(s): RIGHT/LEFT HEART CATH AND CORONARY ANGIOGRAPHY (N/A) as a surgical intervention.  The patient's history has been reviewed, patient examined, no change in status, stable for surgery.  I have reviewed the patient's chart and labs.  Questions were answered to the patient's satisfaction.     Tonny Bollman

## 2023-04-06 ENCOUNTER — Encounter (HOSPITAL_COMMUNITY): Payer: Self-pay | Admitting: Cardiovascular Disease

## 2023-04-08 ENCOUNTER — Ambulatory Visit (HOSPITAL_COMMUNITY): Admit: 2023-04-08 | Payer: Medicare Other

## 2023-04-14 ENCOUNTER — Ambulatory Visit (HOSPITAL_COMMUNITY)
Admission: RE | Admit: 2023-04-14 | Discharge: 2023-04-14 | Disposition: A | Discharge status: Home / Self Care | Payer: Medicare Other | Source: Ambulatory Visit | Attending: Cardiovascular Disease | Admitting: Cardiovascular Disease

## 2023-04-14 DIAGNOSIS — Q231 Congenital insufficiency of aortic valve: Secondary | ICD-10-CM | POA: Diagnosis present

## 2023-04-14 DIAGNOSIS — I350 Nonrheumatic aortic (valve) stenosis: Secondary | ICD-10-CM | POA: Insufficient documentation

## 2023-04-14 DIAGNOSIS — I7121 Aneurysm of the ascending aorta, without rupture: Secondary | ICD-10-CM | POA: Diagnosis present

## 2023-04-14 MED ORDER — IOHEXOL 350 MG/ML SOLN
95.0000 mL | Freq: Once | INTRAVENOUS | Status: AC | PRN
  Administered 2023-04-14: 95 mL via INTRAVENOUS

## 2023-04-20 ENCOUNTER — Encounter: Payer: Self-pay | Admitting: Surgery

## 2023-04-20 ENCOUNTER — Institutional Professional Consult (permissible substitution) (INDEPENDENT_AMBULATORY_CARE_PROVIDER_SITE_OTHER): Payer: Medicare Other | Admitting: Surgery

## 2023-04-20 VITALS — BP 141/95 | HR 62 | Resp 20 | Ht 68.0 in | Wt 170.0 lb

## 2023-04-20 DIAGNOSIS — I350 Nonrheumatic aortic (valve) stenosis: Secondary | ICD-10-CM

## 2023-04-20 DIAGNOSIS — I7121 Aneurysm of the ascending aorta, without rupture: Secondary | ICD-10-CM

## 2023-04-20 NOTE — Progress Notes (Signed)
Cardiothoracic Surgery Consultation   PCP is [facility] Referring Provider is Jonelle Sidle, MD  Chief Complaint  Patient presents with  . Aortic Stenosis    Cardiac cath 04/03/23/ ECHO 11/15/23  . Thoracic Aortic Aneurysm    Chest CTA 04/14/23    HPI:   Past Medical History:  Diagnosis Date  . Arthritis   . Coronary atherosclerosis of native coronary artery    a. NSTEMI (02/2014):  LHC (02/2014):  Mild disease in LAD and CFX; prox RCA occluded with R-R collats, dist RCA filled by L-R collats, inf HK, EF 55%, LVEDP 15 mmHg.  PCI:  Unsuccessful angioplasty of RCA (late presentation of inf MI) - tx medically.  . Essential hypertension   . GERD (gastroesophageal reflux disease)   . Hyperlipidemia   . NSTEMI (non-ST elevated myocardial infarction) (HCC) 03/11/14  . Renal cell carcinoma of right kidney (HCC)    Partial nephrectomy in 2013    Past Surgical History:  Procedure Laterality Date  . BACK SURGERY     1989  . CERVICAL DISC SURGERY  04/29/2021  . COLONOSCOPY N/A 12/11/2020   Procedure: COLONOSCOPY;  Surgeon: Franky Macho, MD;  Location: [facility];  Service: Gastroenterology;  Laterality: N/A;  . LEFT HEART CATHETERIZATION WITH CORONARY ANGIOGRAM N/A 03/17/2014   Procedure: LEFT HEART CATHETERIZATION WITH CORONARY ANGIOGRAM;  Surgeon: Corky Crafts, MD;  Location: [facility] CATH LAB;  Service: Cardiovascular;  Laterality: N/A;  . POLYPECTOMY  12/11/2020   Procedure: POLYPECTOMY;  Surgeon: Franky Macho, MD;  Location: [facility];  Service: Gastroenterology;;  . RIGHT HEART CATH AND CORONARY ANGIOGRAPHY N/A 04/03/2023   Procedure: RIGHT HEART CATH AND CORONARY ANGIOGRAPHY;  Surgeon: Tonny Bollman, MD;  Location: [facility] INVASIVE CV LAB;  Service: Cardiovascular;  Laterality: N/A;  . ROBOT ASSISTED LAPAROSCOPIC NEPHRECTOMY  01/14/2012   Procedure: ROBOTIC ASSISTED LAPAROSCOPIC NEPHRECTOMY;  Surgeon: Milford Cage, MD;  Location: [facility] ORS;   Service: Urology;  Laterality: Right;  Robot Laparoscopic Right Partial Nephrectomy    . TONSILLECTOMY      Family History  Problem Relation Age of Onset  . Renal Disease Daughter   . Hypertension Daughter     Social History Social History   Tobacco Use  . Smoking status: Former    Types: Cigarettes    Quit date: 11/18/1979    Years since quitting: 43.4  . Smokeless tobacco: Never  Vaping Use  . Vaping Use: Never used  Substance Use Topics  . Alcohol use: No    Alcohol/week: 0.0 standard drinks of alcohol  . Drug use: Yes    Types: Marijuana    Current Outpatient Medications  Medication Sig Dispense Refill  . albuterol (VENTOLIN HFA) 108 (90 Base) MCG/ACT inhaler Inhale 2 puffs into the lungs 2 (two) times daily.    Marland Kitchen amLODipine (NORVASC) 5 MG tablet Take 1 tablet (5 mg total) by mouth daily. 90 tablet 3  . aspirin EC 81 MG EC tablet Take 1 tablet (81 mg total) by mouth daily.    Marland Kitchen atorvastatin (LIPITOR) 80 MG tablet Take 1 tablet (80 mg total) by mouth daily. 90 tablet 3  . baclofen (LIORESAL) 10 MG tablet Take 1 tablet (10 mg total) by mouth 3 (three) times daily. (Patient taking differently: Take 10 mg by mouth daily.) 90 each 5  . finasteride (PROSCAR) 5 MG tablet Take 5 mg by mouth daily.  3  . losartan (COZAAR) 100 MG tablet Take 1 tablet (100 mg total)  by mouth daily. 90 tablet 1  . metoprolol succinate (TOPROL-XL) 25 MG 24 hr tablet TAKE 1 TABLET ONCE DAILY. 90 tablet 1   No current facility-administered medications for this visit.    Allergies  Allergen Reactions  . Alcohol Other (See Comments)    Pts skin gets red. Ex. Pt used alcohol based deodorant and got red things under arms. & Drinks alcohol notices "from heart on up" everything turns red.  . Chlorthalidone     Dizziness and Syncopal Episode    Review of Systems  BP (!) 141/95   Pulse 62   Resp 20   Ht 5\' 8"  (1.727 m)   Wt 170 lb (77.1 kg)   SpO2 98% Comment: RA  BMI 25.85 kg/m  Physical  Exam   Diagnostic Tests:  ECHOCARDIOGRAM REPORT       Patient Name:   [name] Date of Exam: 11/14/2022  Medical Rec #:  [id number]      Height:       68.0 in  Accession #:    [id number]     Weight:       173.0 lb  Date of Birth:  [birth year]     BSA:          1.922 m  Patient Age:    67 years       BP:           136/82 mmHg  Patient Gender: M              HR:           52 bpm.  Exam Location:  [place]   Procedure: 2D Echo, Cardiac Doppler, Color Doppler and Strain Analysis   Indications:    R01.1 Murmur    History:        Patient has no prior history of Echocardiogram  examinations.                 CAD, Reanl cell cancer, Aortic Valve Disease,                  Arrythmias:Tachycardia; Risk Factors:Hypertension,  Dyslipidemia                 and Former Smoker.    Sonographer:    Jake Seats RDMS, RVT, RDCS  Referring Phys: [id number] ELIZABETH PECK   IMPRESSIONS     1. Left ventricular ejection fraction, by estimation, is 55 to 60%. The  left ventricle has normal function. The left ventricle has no regional  wall motion abnormalities. There is mild left ventricular hypertrophy.  Left ventricular diastolic parameters  are consistent with Grade I diastolic dysfunction (impaired relaxation).  The average left ventricular global longitudinal strain is -19.0 %. The  global longitudinal strain is normal.   2. Right ventricular systolic function is normal. The right ventricular  size is normal. Tricuspid regurgitation signal is inadequate for assessing  PA pressure.   3. The mitral valve is normal in structure. Trivial mitral valve  regurgitation. No evidence of mitral stenosis.   4. The tricuspid valve is abnormal.   5. The aortic valve has an indeterminant number of cusps. Aortic valve  regurgitation is moderate. Severe aortic valve stenosis.   6. Limited visualization of the ascending aorta, the visualized parts are  at least moderately dilated. Consider additional  imaging with CTA or MRA  to better evalute degree and extent of aortic aneurysm. Marland Kitchen Aortic  dilatation noted. There is mild  dilatation of  the aortic root, measuring 42 mm. There is moderate  dilatation of the ascending aorta, measuring 48 mm.   7. The inferior vena cava is normal in size with greater than 50%  respiratory variability, suggesting right atrial pressure of 3 mmHg.   FINDINGS   Left Ventricle: Left ventricular ejection fraction, by estimation, is 55  to 60%. The left ventricle has normal function. The left ventricle has no  regional wall motion abnormalities. The average left ventricular global  longitudinal strain is -19.0 %.  The global longitudinal strain is normal. The left ventricular internal  cavity size was normal in size. There is mild left ventricular  hypertrophy. Left ventricular diastolic parameters are consistent with  Grade I diastolic dysfunction (impaired  relaxation). Normal left ventricular filling pressure.   Right Ventricle: The right ventricular size is normal. Right vetricular  wall thickness was not well visualized. Right ventricular systolic  function is normal. Tricuspid regurgitation signal is inadequate for  assessing PA pressure.   Left Atrium: Left atrial size was normal in size.   Right Atrium: Right atrial size was normal in size.   Pericardium: There is no evidence of pericardial effusion.   Mitral Valve: The mitral valve is normal in structure. Trivial mitral  valve regurgitation. No evidence of mitral valve stenosis.   Tricuspid Valve: The tricuspid valve is abnormal. Tricuspid valve  regurgitation is mild . No evidence of tricuspid stenosis.   Aortic Valve: The aortic valve has an indeterminant number of cusps.  Aortic valve regurgitation is moderate. Severe aortic stenosis is present.  Aortic valve mean gradient measures 43.0 mmHg. Aortic valve peak gradient  measures 66.0 mmHg. Aortic valve  area, by VTI measures 0.74 cm.  Aortic valve mean gradient measures 43.0  mmHg. Aortic valve peak gradient measures 66.0 mmHg. Aortic valve area, by  VTI measures 0.74 cm. DI 0.26     Pulmonic Valve: The pulmonic valve was not well visualized. Pulmonic valve  regurgitation is not visualized. No evidence of pulmonic stenosis.   Aorta: Limited visualization of the ascending aorta, the visualized parts  are at least moderately dilated. Consider additional imaging with CTA or  MRA to better evalute degree and extent of aortic aneurysm. Aortic  dilatation noted. There is mild  dilatation of the aortic root, measuring 42 mm. There is moderate  dilatation of the ascending aorta, measuring 48 mm.   Venous: The inferior vena cava is normal in size with greater than 50%  respiratory variability, suggesting right atrial pressure of 3 mmHg.   IAS/Shunts: No atrial level shunt detected by color flow Doppler.     LEFT VENTRICLE  PLAX 2D  LVIDd:         4.50 cm     Diastology  LVIDs:         3.35 cm     LV e' medial:    4.70 cm/s  LV PW:         1.10 cm     LV E/e' medial:  15.1  LV IVS:        1.15 cm     LV e' lateral:   5.92 cm/s  LVOT diam:     1.90 cm     LV E/e' lateral: 12.0  LV SV:         74  LV SV Index:   38          2D Longitudinal Strain  LVOT Area:     2.84 cm  2D Strain GLS (A2C):   -19.4 %                             2D Strain GLS (A3C):   -18.7 %                             2D Strain GLS (A4C):   -18.8 %  LV Volumes (MOD)           2D Strain GLS Avg:     -19.0 %  LV vol d, MOD A2C: 59.7 ml  LV vol d, MOD A4C: 57.2 ml  LV vol s, MOD A2C: 22.8 ml  LV vol s, MOD A4C: 22.0 ml  LV SV MOD A2C:     36.9 ml  LV SV MOD A4C:     57.2 ml  LV SV MOD BP:      37.6 ml   RIGHT VENTRICLE  RV S prime:     10.20 cm/s  TAPSE (M-mode): 2.0 cm   LEFT ATRIUM             Index        RIGHT ATRIUM           Index  LA diam:        2.90 cm 1.51 cm/m   RA Area:     12.70 cm  LA Vol (A2C):   36.5 ml 18.99 ml/m  RA  Volume:   26.50 ml  13.79 ml/m  LA Vol (A4C):   27.9 ml 14.52 ml/m  LA Biplane Vol: 31.9 ml 16.60 ml/m   AORTIC VALVE  AV Area (Vmax):    0.74 cm  AV Area (Vmean):   0.76 cm  AV Area (VTI):     0.74 cm  AV Vmax:           406.33 cm/s  AV Vmean:          298.333 cm/s  AV VTI:            1.000 m  AV Peak Grad:      66.0 mmHg  AV Mean Grad:      43.0 mmHg  LVOT Vmax:         106.00 cm/s  LVOT Vmean:        80.000 cm/s  LVOT VTI:          0.260 m  LVOT/AV VTI ratio: 0.26    AORTA  Ao Root diam: 4.20 cm  Ao Asc diam:  4.80 cm  Ao Arch diam: 3.0 cm   MITRAL VALVE  MV Area (PHT): 2.39 cm     SHUNTS  MV Decel Time: 317 msec     Systemic VTI:  0.26 m  MR Peak grad: 63.4 mmHg     Systemic Diam: 1.90 cm  MR Vmax:      398.00 cm/s  MV E velocity: 71.10 cm/s  MV A velocity: 112.00 cm/s  MV E/A ratio:  0.63   Dina Rich MD  Electronically signed by Dina Rich MD  Signature Date/Time: 11/18/2022/6:29:34 PM        Final     Physicians  Panel Physicians Referring Physician Case Authorizing Physician  Tonny Bollman, MD (Primary)     Procedures  RIGHT HEART CATH AND CORONARY ANGIOGRAPHY   Conclusion    .  Prox RCA lesion is 100% stenosed. .  There is severe  aortic valve stenosis.   1.  Known severe aortic stenosis by noninvasive assessment, likely bicuspid valve 2.  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  Diffuse nonobstructive plaquing in the left main, LAD, and left circumflex with no significant obstruction. 4.  Normal right heart pressures   Recommendations: Continued evaluation for aortic valve replacement and ascending aortic aneurysm repair.  Recommend CTA studies followed by surgical consultation.   Indications  Severe aortic stenosis [I35.0 (ICD-10-CM)]   Procedural Details  Technical Details INDICATION: Severe aortic stenosis, aortic aneurysm. Preop study.  PROCEDURAL DETAILS: There was an indwelling  IV in a right antecubital vein. Using normal sterile technique, the IV was changed out for a 5 Fr brachial sheath over a 0.018 inch wire. The right wrist was then prepped, draped, and anesthetized with 1% lidocaine. Using the modified Seldinger technique a 5/6 French Slender sheath was placed in the right radial artery. Intra-arterial verapamil was administered through the radial artery sheath. IV heparin was administered after a JR4 catheter was advanced into the central aorta. A Swan-Ganz catheter was used for the right heart catheterization. Standard protocol was followed for recording of right heart pressures and sampling of oxygen saturations. Fick cardiac output was calculated. Standard Judkins catheters were used for selective coronary angiography. There were no immediate procedural complications. The patient was transferred to the post catheterization recovery area for further monitoring.      Estimated blood loss <50 mL.   During this procedure medications were administered to achieve and maintain moderate conscious sedation while the patient's heart rate, blood pressure, and oxygen saturation were continuously monitored and I was present face-to-face 100% of this time.   Medications (Filter: Administrations occurring from 0915 to 1020 on 04/03/23)  important  Continuous medications are totaled by the amount administered until 04/03/23 1020.   midazolam (VERSED) injection (mg)  Total dose: 2 mg Date/Time Rate/Dose/Volume Action   04/03/23 0951 2 mg Given   fentaNYL (SUBLIMAZE) injection (mcg)  Total dose: 25 mcg Date/Time Rate/Dose/Volume Action   04/03/23 0951 25 mcg Given   lidocaine (PF) (XYLOCAINE) 1 % injection (mL)  Total volume: 4 mL Date/Time Rate/Dose/Volume Action   04/03/23 0953 2 mL Given   0954 2 mL Given   Radial Cocktail/Verapamil only (mL)  Total volume: 10 mL Date/Time Rate/Dose/Volume Action   04/03/23 0955 10 mL Given   heparin sodium (porcine) injection  (Units)  Total dose: 4,000 Units Date/Time Rate/Dose/Volume Action   04/03/23 1003 4,000 Units Given   iohexol (OMNIPAQUE) 350 MG/ML injection (mL)  Total volume: 40 mL Date/Time Rate/Dose/Volume Action   04/03/23 1010 40 mL Given   Heparin (Porcine) in NaCl 1000-0.9 UT/500ML-% SOLN (mL)  Total volume: 1,000 mL Date/Time Rate/Dose/Volume Action   04/03/23 1010 500 mL Given   1010 500 mL Given    Sedation Time  Sedation Time Physician-1: 14 minutes 52 seconds Contrast     Administrations occurring from 0915 to 1020 on 04/03/23:  Medication Name Total Dose  iohexol (OMNIPAQUE) 350 MG/ML injection 40 mL   Radiation/Fluoro  Fluoro time: 3.2 (min) DAP: 11741 (mGycm2) Cumulative Air Kerma: 167 (mGy) Complications  Complications documented before study signed (04/03/2023 10:25 AM)   No complications were associated with this study.  Documented by Velora Heckler, RT - 04/03/2023 10:18 AM     Coronary Findings  Diagnostic Dominance: Right Left Main  The vessel exhibits minimal luminal irregularities.    Left Anterior Descending  There is  mild diffuse disease throughout the vessel. The LAD reaches the apex. There is mild diffuse plaquing. The proximal LAD is ectatic before the origin of the first diagonal. The first diagonal supplies a large territory myocardium. There is no obstructive disease throughout the LAD or diagonal territories.    Left Circumflex  There is mild diffuse disease throughout the vessel.    Right Coronary Artery  The right coronary artery is occluded proximally. There is a well-formed bridging collateral and the distal vessel fills from this.  Collaterals  Mid RCA filled by collaterals from Ost RCA.    Prox RCA lesion is 100% stenosed.    Intervention   No interventions have been documented.   Left Heart  Aortic Valve There is severe aortic valve stenosis. The aortic valve is calcified. There is restricted aortic valve motion.   Coronary  Diagrams  Diagnostic Dominance: Right  Intervention   Implants   No implant documentation for this case.   Syngo Images   Show images for CARDIAC CATHETERIZATION Images on Long Term Storage   Show images for Andie, Burgess to Procedure Log  Procedure Log    Hemo Data  Flowsheet Row Most Recent Value  Fick Cardiac Output 5.8 L/min  Fick Cardiac Output Index 3.04 (L/min)/BSA  RA A Wave 9 mmHg  RA V Wave 6 mmHg  RA Mean 5 mmHg  RV Systolic Pressure 32 mmHg  RV Diastolic Pressure 2 mmHg  RV EDP 7 mmHg  PA Systolic Pressure 29 mmHg  PA Diastolic Pressure 8 mmHg  PA Mean 17 mmHg  PW A Wave 9 mmHg  PW V Wave 7 mmHg  PW Mean 9 mmHg  AO Systolic Pressure 130 mmHg  AO Diastolic Pressure 77 mmHg  AO Mean 99 mmHg  QP/QS 1  TPVR Index 5.58 HRUI  TSVR Index 32.51 HRUI  PVR SVR Ratio 0.09  TPVR/TSVR Ratio 0.17    ADDENDUM REPORT: 04/15/2023 13:58   EXAM: OVER-READ INTERPRETATION  CT CHEST   The following report is an over-read performed by radiologist Dr. Jacob Moores [facility] on 04/15/2023. This over-read does not include interpretation of cardiac or coronary anatomy or pathology. The cardiac TAVR interpretation by the cardiologist is attached.   COMPARISON:  None.   FINDINGS: Extracardiac findings will be described separately under dictation for contemporaneously obtained CTA chest, abdomen and pelvis.   IMPRESSION: Please see separate dictation for contemporaneously obtained CTA chest, abdomen and pelvis dated 04/15/2023 for full description of relevant extracardiac findings.     Electronically Signed   By: Allegra Lai M.D.   On: 04/15/2023 13:58    Addended by Renford Dills, MD on 04/15/2023  2:01 PM    Study Result  Narrative & Impression  CLINICAL DATA:  [phone] with severe aortic stenosis being evaluated for a TAVR procedure.   EXAM: Cardiac TAVR CT   TECHNIQUE: The patient was scanned on a [employer]. A 120 kV retrospective scan was triggered in the descending thoracic aorta at 111 HU's. Gantry rotation speed was 250 msecs and collimation was .6 mm. No beta blockade or nitro were given. The 3D data set was reconstructed in 5% intervals of the R-R cycle. Systolic and diastolic phases were analyzed on a dedicated work station using MPR, MIP and VRT modes. The patient received 100 cc of contrast.   FINDINGS: Aortic Root:   Aortic valve: Bicuspid aortic valve with fusion of left and right coronary cusps   Aortic valve  calcium score: 2289   Aortic annulus:   Diameter: 28mm x 22mm   Perimeter: 79mm   Area: 481 mm^2   Calcifications: No calcifications   Coronary height: Min Left - 13mm; Min Right - 9mm   Sinotubular height: Left cusp - 21mm; Right cusp - 14mm; Noncoronary cusp - 26mm   LVOT (as measured 3 mm below the annulus):   Diameter: 30mm x 22mm   Area: 499 mm^2   Calcifications: No calcifications   Aortic sinus width: Left cusp - 36mm; Right cusp - 35mm; Noncoronary cusp - 38mm   Sinotubular junction width: 38mm x 37mm   Ascending aorta: Aneurysm measuring 51mm x 50mm   Optimum Fluoroscopic Angle for Delivery: RAO 18 CAU 10   Cardiac:   Right atrium: Mild enlargement   Right ventricle: Normal size   Pulmonary arteries: Normal size   Pulmonary veins: Normal configuration   Left atrium: Normal size   Left ventricle: Normal size   Pericardium: Normal thickness   Coronary arteries: Normal origins. Calcium score 561 (80th percentile)   IMPRESSION: 1. Bicuspid aortic valve with fusion of right and left coronary cusps. Severe aortic valve calcifications (AV calcium score 2289)   2.  Ascending aortic aneurysm measuring 51mm x 50mm   3. Aortic annulus measures 28mm x 22mm in diameter with perimeter 79mm and area 471mm^2. No annular or LVOT calcifications. Annular measurements are suitable for delivery of 26mm Edwards Sapien 3 valve,  though in setting of ascending aortic aneurysm as above should be evaluated for surgical AVR/aortic aneurysm repair   4. Low coronary height to RCA (9mm). Sufficient coronary height to left main (13mm)   5.  Optimum Fluoroscopic Angle for Delivery:  RAO 18 CAU 10   6.  Coronary calcium score 561 (80th percentile)   Electronically Signed: By: Epifanio Lesches M.D. On: 04/14/2023 12:42       Impression:   Plan:   Alleen Borne, MD [facility] [phone]

## 2023-05-04 ENCOUNTER — Encounter: Payer: Self-pay | Admitting: *Deleted

## 2023-05-04 ENCOUNTER — Other Ambulatory Visit: Payer: Self-pay | Admitting: *Deleted

## 2023-05-04 DIAGNOSIS — R079 Chest pain, unspecified: Secondary | ICD-10-CM

## 2023-05-04 DIAGNOSIS — R7309 Other abnormal glucose: Secondary | ICD-10-CM

## 2023-05-04 DIAGNOSIS — I350 Nonrheumatic aortic (valve) stenosis: Secondary | ICD-10-CM

## 2023-05-12 ENCOUNTER — Other Ambulatory Visit: Payer: Self-pay | Admitting: Nurse Practitioner

## 2023-05-13 NOTE — Pre-Procedure Instructions (Signed)
Surgical Instructions    Your procedure is scheduled on May 18, 2023.  Report to [facility] Main Entrance "A" at 5:30 A.M., then check in with the Admitting office.  Call this number if you have problems the morning of surgery:  [phone]  If you have any questions prior to your surgery date call [phone]: Open Monday-Friday 8am-4pm If you experience any cold or flu symptoms such as cough, fever, chills, shortness of breath, etc. between now and your scheduled surgery, please notify us at the above number.     Remember:  Do not eat or drink after midnight the night before your surgery     Take these medicines the morning of surgery with A SIP OF WATER:  albuterol (VENTOLIN HFA) inhaler   amLODipine (NORVASC)   atorvastatin (LIPITOR)   baclofen (LIORESAL)   finasteride (PROSCAR)   metoprolol succinate (TOPROL-XL)    As of today, STOP taking any Aleve, Naproxen, Ibuprofen, Motrin, Advil, Goody's, BC's, all herbal medications, fish oil, and all vitamins.                     Do NOT Smoke (Tobacco/Vaping) for 24 hours prior to your procedure.  If you use a CPAP at night, you may bring your mask/headgear for your overnight stay.   Contacts, glasses, piercing's, hearing aid's, dentures or partials may not be worn into surgery, please bring cases for these belongings.    For patients admitted to the hospital, discharge time will be determined by your treatment team.   Patients discharged the day of surgery will not be allowed to drive home, and someone needs to stay with them for 24 hours.  SURGICAL WAITING ROOM VISITATION Patients having surgery or a procedure may have no more than 2 support people in the waiting area - these visitors may rotate.   Children under the age of 83 must have an adult with them who is not the patient. If the patient needs to stay at the hospital during part of their recovery, the visitor guidelines for inpatient rooms apply. Pre-op nurse will  coordinate an appropriate time for 1 support person to accompany patient in pre-op.  This support person may not rotate.   Please refer to the [facility] website for the visitor guidelines for Inpatients (after your surgery is over and you are in a regular room).    Special instructions:   [facility]- Preparing For Surgery  Before surgery, you can play an important role. Because skin is not sterile, your skin needs to be as free of germs as possible. You can reduce the number of germs on your skin by washing with CHG (chlorahexidine gluconate) Soap before surgery.  CHG is an antiseptic cleaner which kills germs and bonds with the skin to continue killing germs even after washing.    Oral Hygiene is also important to reduce your risk of infection.  Remember - BRUSH YOUR TEETH THE MORNING OF SURGERY WITH YOUR REGULAR TOOTHPASTE  Please do not use if you have an allergy to CHG or antibacterial soaps. If your skin becomes reddened/irritated stop using the CHG.  Do not shave (including legs and underarms) for at least 48 hours prior to first CHG shower. It is OK to shave your face.  Please follow these instructions carefully.   Shower the NIGHT BEFORE SURGERY and the MORNING OF SURGERY  If you chose to wash your hair, wash your hair first as usual with your normal shampoo.  After you shampoo,  rinse your hair and body thoroughly to remove the shampoo.  Use CHG Soap as you would any other liquid soap. You can apply CHG directly to the skin and wash gently with a scrungie or a clean washcloth.   Apply the CHG Soap to your body ONLY FROM THE NECK DOWN.  Do not use on open wounds or open sores. Avoid contact with your eyes, ears, mouth and genitals (private parts). Wash Face and genitals (private parts)  with your normal soap.   Wash thoroughly, paying special attention to the area where your surgery will be performed.  Thoroughly rinse your body with warm water from the neck down.  DO NOT  shower/wash with your normal soap after using and rinsing off the CHG Soap.  Pat yourself dry with a CLEAN TOWEL.  Wear CLEAN PAJAMAS to bed the night before surgery  Place CLEAN SHEETS on your bed the night before your surgery  DO NOT SLEEP WITH PETS.   Day of Surgery: Take a shower with CHG soap. Do not wear jewelry or makeup Do not wear lotions, powders, perfumes/colognes, or deodorant. Do not shave 48 hours prior to surgery.  Men may shave face and neck. Do not bring valuables to the hospital.  [facility] is not responsible for any belongings or valuables. Do not wear nail polish, gel polish, artificial nails, or any other type of covering on natural nails (fingers and toes) If you have artificial nails or gel coating that need to be removed by a nail salon, please have this removed prior to surgery. Artificial nails or gel coating may interfere with anesthesia's ability to adequately monitor your vital signs.  Wear Clean/Comfortable clothing the morning of surgery Remember to brush your teeth WITH YOUR REGULAR TOOTHPASTE.   Please read over the following fact sheets that you were given.    If you received a COVID test during your pre-op visit  it is requested that you wear a mask when out in public, stay away from anyone that may not be feeling well and notify your surgeon if you develop symptoms. If you have been in contact with anyone that has tested positive in the last 10 days please notify you surgeon.

## 2023-05-14 ENCOUNTER — Encounter (HOSPITAL_COMMUNITY)
Admission: RE | Admit: 2023-05-14 | Discharge: 2023-05-14 | Disposition: A | Discharge status: Home / Self Care | Payer: Medicare Other | Source: Ambulatory Visit | Attending: Surgery | Admitting: Surgery

## 2023-05-14 ENCOUNTER — Encounter (HOSPITAL_COMMUNITY): Payer: Self-pay

## 2023-05-14 ENCOUNTER — Other Ambulatory Visit: Payer: Self-pay

## 2023-05-14 ENCOUNTER — Ambulatory Visit (HOSPITAL_COMMUNITY)
Admission: RE | Admit: 2023-05-14 | Discharge: 2023-05-14 | Disposition: A | Discharge status: Home / Self Care | Payer: Medicare Other | Source: Ambulatory Visit | Attending: Surgery | Admitting: Surgery

## 2023-05-14 VITALS — BP 113/81 | HR 58 | Temp 97.9°F | Resp 18 | Ht 68.0 in | Wt 169.0 lb

## 2023-05-14 DIAGNOSIS — I7121 Aneurysm of the ascending aorta, without rupture: Secondary | ICD-10-CM | POA: Diagnosis not present

## 2023-05-14 DIAGNOSIS — I350 Nonrheumatic aortic (valve) stenosis: Secondary | ICD-10-CM | POA: Insufficient documentation

## 2023-05-14 DIAGNOSIS — Z01810 Encounter for preprocedural cardiovascular examination: Secondary | ICD-10-CM | POA: Insufficient documentation

## 2023-05-14 DIAGNOSIS — Z01818 Encounter for other preprocedural examination: Secondary | ICD-10-CM | POA: Insufficient documentation

## 2023-05-14 DIAGNOSIS — Z905 Acquired absence of kidney: Secondary | ICD-10-CM | POA: Diagnosis not present

## 2023-05-14 DIAGNOSIS — I2510 Atherosclerotic heart disease of native coronary artery without angina pectoris: Secondary | ICD-10-CM | POA: Diagnosis not present

## 2023-05-14 DIAGNOSIS — R079 Chest pain, unspecified: Secondary | ICD-10-CM

## 2023-05-14 DIAGNOSIS — I2582 Chronic total occlusion of coronary artery: Secondary | ICD-10-CM | POA: Diagnosis not present

## 2023-05-14 DIAGNOSIS — R7309 Other abnormal glucose: Secondary | ICD-10-CM | POA: Diagnosis present

## 2023-05-14 DIAGNOSIS — I10 Essential (primary) hypertension: Secondary | ICD-10-CM | POA: Insufficient documentation

## 2023-05-14 DIAGNOSIS — E785 Hyperlipidemia, unspecified: Secondary | ICD-10-CM | POA: Insufficient documentation

## 2023-05-14 DIAGNOSIS — Z1152 Encounter for screening for COVID-19: Secondary | ICD-10-CM | POA: Diagnosis not present

## 2023-05-14 DIAGNOSIS — I252 Old myocardial infarction: Secondary | ICD-10-CM | POA: Diagnosis not present

## 2023-05-14 DIAGNOSIS — Z87891 Personal history of nicotine dependence: Secondary | ICD-10-CM | POA: Diagnosis not present

## 2023-05-14 DIAGNOSIS — Z85528 Personal history of other malignant neoplasm of kidney: Secondary | ICD-10-CM | POA: Diagnosis not present

## 2023-05-14 DIAGNOSIS — Z981 Arthrodesis status: Secondary | ICD-10-CM | POA: Diagnosis not present

## 2023-05-14 LAB — CBC
HCT: 43.2 % (ref 39.0–52.0)
Hemoglobin: 14.1 g/dL (ref 13.0–17.0)
MCH: 29.9 pg (ref 26.0–34.0)
MCHC: 32.6 g/dL (ref 30.0–36.0)
MCV: 91.5 fL (ref 80.0–100.0)
Platelets: 208 10*3/uL (ref 150–400)
RBC: 4.72 MIL/uL (ref 4.22–5.81)
RDW: 13.4 % (ref 11.5–15.5)
WBC: 6.4 10*3/uL (ref 4.0–10.5)
nRBC: 0 % (ref 0.0–0.2)

## 2023-05-14 LAB — COMPREHENSIVE METABOLIC PANEL
ALT: 21 U/L (ref 0–44)
AST: 18 U/L (ref 15–41)
Albumin: 3.7 g/dL (ref 3.5–5.0)
Alkaline Phosphatase: 83 U/L (ref 38–126)
Anion gap: 9 (ref 5–15)
BUN: 16 mg/dL (ref 8–23)
CO2: 25 mmol/L (ref 22–32)
Calcium: 9.1 mg/dL (ref 8.9–10.3)
Chloride: 105 mmol/L (ref 98–111)
Creatinine, Ser: 1.09 mg/dL (ref 0.61–1.24)
GFR, Estimated: >60 mL/min (ref >60)
Glucose, Bld: 118 mg/dL — ABNORMAL HIGH (ref 70–99)
Potassium: 3.8 mmol/L (ref 3.5–5.1)
Sodium: 139 mmol/L (ref 135–145)
Total Bilirubin: 0.7 mg/dL (ref 0.3–1.2)
Total Protein: 6.4 g/dL — ABNORMAL LOW (ref 6.5–8.1)

## 2023-05-14 LAB — URINALYSIS, ROUTINE W REFLEX MICROSCOPIC
Bilirubin Urine: NEGATIVE
Glucose, UA: NEGATIVE mg/dL
Ketones, ur: NEGATIVE mg/dL
Leukocytes,Ua: NEGATIVE
Nitrite: NEGATIVE
Protein, ur: NEGATIVE mg/dL
Specific Gravity, Urine: 1.015 (ref 1.005–1.030)
pH: 7 (ref 5.0–8.0)

## 2023-05-14 LAB — PROTIME-INR
INR: 1 (ref 0.8–1.2)
Prothrombin Time: 13.8 seconds (ref 11.4–15.2)

## 2023-05-14 LAB — SURGICAL PCR SCREEN
MRSA, PCR: NEGATIVE
Staphylococcus aureus: NEGATIVE

## 2023-05-14 LAB — TYPE AND SCREEN
ABO/RH(D): A POS
Antibody Screen: NEGATIVE

## 2023-05-14 LAB — APTT: aPTT: 29 seconds (ref 24–36)

## 2023-05-14 NOTE — Progress Notes (Signed)
[name], with Dr. Sharee Pimple office was informed that the pt's ABG was unable to be collected at PAT appt today and it will need to be collected on day of surgery

## 2023-05-14 NOTE — Progress Notes (Signed)
PCP - [name], PA-C- [facility] [place] [facility] Cardiologist - Dr. Nona Dell  PPM/ICD - denies   Chest x-ray - 05/14/23 EKG - 05/14/23 Stress Test - denies ECHO - 11/14/22 Cardiac Cath - 04/03/23  Sleep Study - denies   DM- denies  Blood Thinner Instructions: n/a Aspirin Instructions: Hold DOS  ERAS Protcol - no, NPO   COVID TEST- 05/14/23   Anesthesia review: yes, cardiac hx  Patient denies shortness of breath, fever, cough and chest pain at PAT appointment   All instructions explained to the patient, with a verbal understanding of the material. Patient agrees to go over the instructions while at home for a better understanding. Patient also instructed to wear a mask in public after being tested for COVID-19. The opportunity to ask questions was provided.

## 2023-05-15 LAB — SARS CORONAVIRUS 2 (TAT 6-24 HRS): SARS Coronavirus 2: NEGATIVE

## 2023-05-15 LAB — HEMOGLOBIN A1C
Hgb A1c MFr Bld: 5.8 % — ABNORMAL HIGH (ref 4.8–5.6)
Mean Plasma Glucose: 120 mg/dL

## 2023-05-15 MED ORDER — CEFAZOLIN SODIUM-DEXTROSE 2-4 GM/100ML-% IV SOLN
2.0000 g | INTRAVENOUS | Status: AC
  Administered 2023-05-18 (×2): 2 g via INTRAVENOUS
  Filled 2023-05-15: qty 100

## 2023-05-15 MED ORDER — MILRINONE LACTATE IN DEXTROSE 20-5 MG/100ML-% IV SOLN
0.3000 ug/kg/min | INTRAVENOUS | Status: DC
  Filled 2023-05-15: qty 100

## 2023-05-15 MED ORDER — VANCOMYCIN HCL 1250 MG/250ML IV SOLN
1250.0000 mg | INTRAVENOUS | Status: AC
  Administered 2023-05-18: 1250 mg via INTRAVENOUS
  Filled 2023-05-15: qty 250

## 2023-05-15 MED ORDER — DEXMEDETOMIDINE HCL IN NACL 400 MCG/100ML IV SOLN
0.1000 ug/kg/h | INTRAVENOUS | Status: AC
  Administered 2023-05-18: .4 ug/kg/h via INTRAVENOUS
  Filled 2023-05-15: qty 100

## 2023-05-15 MED ORDER — INSULIN REGULAR(HUMAN) IN NACL 100-0.9 UT/100ML-% IV SOLN
INTRAVENOUS | Status: AC
  Administered 2023-05-18: 2 [IU]/h via INTRAVENOUS
  Filled 2023-05-15: qty 100

## 2023-05-15 MED ORDER — POTASSIUM CHLORIDE 2 MEQ/ML IV SOLN
80.0000 meq | INTRAVENOUS | Status: DC
  Filled 2023-05-15: qty 40

## 2023-05-15 MED ORDER — TRANEXAMIC ACID (OHS) PUMP PRIME SOLUTION
2.0000 mg/kg | INTRAVENOUS | Status: DC
  Filled 2023-05-15: qty 1.53

## 2023-05-15 MED ORDER — TRANEXAMIC ACID 1000 MG/10ML IV SOLN
1.5000 mg/kg/h | INTRAVENOUS | Status: AC
  Administered 2023-05-18: 1.5 mg/kg/h via INTRAVENOUS
  Filled 2023-05-15: qty 25

## 2023-05-15 MED ORDER — HEPARIN 30,000 UNITS/1000 ML (OHS) CELLSAVER SOLUTION
Status: DC
  Filled 2023-05-15: qty 1000

## 2023-05-15 MED ORDER — PHENYLEPHRINE HCL-NACL 20-0.9 MG/250ML-% IV SOLN
30.0000 ug/min | INTRAVENOUS | Status: DC
  Filled 2023-05-15: qty 250

## 2023-05-15 MED ORDER — NOREPINEPHRINE 4 MG/250ML-% IV SOLN
0.0000 ug/min | INTRAVENOUS | Status: DC
  Filled 2023-05-15: qty 250

## 2023-05-15 MED ORDER — PLASMA-LYTE A IV SOLN
INTRAVENOUS | Status: DC
  Filled 2023-05-15: qty 2.5

## 2023-05-15 MED ORDER — CEFAZOLIN SODIUM-DEXTROSE 2-4 GM/100ML-% IV SOLN
2.0000 g | INTRAVENOUS | Status: DC
  Filled 2023-05-15: qty 100

## 2023-05-15 MED ORDER — EPINEPHRINE HCL 5 MG/250ML IV SOLN IN NS
0.0000 ug/min | INTRAVENOUS | Status: DC
  Filled 2023-05-15: qty 250

## 2023-05-15 MED ORDER — MANNITOL 20 % IV SOLN
INTRAVENOUS | Status: DC
  Filled 2023-05-15: qty 13

## 2023-05-15 MED ORDER — NITROGLYCERIN IN D5W 200-5 MCG/ML-% IV SOLN
2.0000 ug/min | INTRAVENOUS | Status: AC
  Administered 2023-05-18: 5 ug/min via INTRAVENOUS
  Filled 2023-05-15: qty 250

## 2023-05-15 MED ORDER — TRANEXAMIC ACID (OHS) BOLUS VIA INFUSION
15.0000 mg/kg | INTRAVENOUS | Status: AC
  Administered 2023-05-18: 1150.5 mg via INTRAVENOUS
  Filled 2023-05-15: qty 1151

## 2023-05-15 NOTE — Anesthesia Preprocedure Evaluation (Signed)
Anesthesia Evaluation    Airway        Dental   Pulmonary former smoker          Cardiovascular hypertension,      Neuro/Psych    GI/Hepatic   Endo/Other    Renal/GU      Musculoskeletal   Abdominal   Peds  Hematology   Anesthesia Other Findings   Reproductive/Obstetrics                             Anesthesia Physical Anesthesia Plan  ASA:   Anesthesia Plan:    Post-op Pain Management:    Induction:   PONV Risk Score and Plan:   Airway Management Planned:   Additional Equipment:   Intra-op Plan:   Post-operative Plan:   Informed Consent:   Plan Discussed with:   Anesthesia Plan Comments: (PAT note written 05/15/2023 by Pailyn Bellevue, PA-C.  )       Anesthesia Quick Evaluation    Body Hypothermia per surgeon request and Delibrate Circulatory arrest per surgeon request  Post-operative Plan: Post-operative intubation/ventilation  Informed Consent: I have reviewed the patients History and Physical, chart, labs and discussed the procedure including the risks, benefits and alternatives for the proposed anesthesia with the patient or authorized representative who has  indicated his/her understanding and acceptance.     Dental advisory given  Plan Discussed with: CRNA  Anesthesia Plan Comments: (PAT note written 05/15/2023 by Shonna Chock, PA-C.  )       Anesthesia Quick Evaluation

## 2023-05-15 NOTE — Progress Notes (Signed)
Anesthesia Chart Review:  Case: [id number] Date/Time: 05/18/23 0715   Procedures:      AORTIC VALVE REPLACEMENT (AVR) (Chest)     REPLACEMENT OF ASCENDING AORTIC ANEURYSM - CIRC ARREST     TRANSESOPHAGEAL ECHOCARDIOGRAM   Anesthesia type: General   Pre-op diagnosis: AS, TAA   [facility] RO[facility] / [facility]   Surgeons: Alleen Borne, MD       DISCUSSION: Patient is a 67 year old male scheduled for the above procedure.  History includes former smoker, HTN, HLD, CAD (late presentation NSTEMI with occluded RCA, s/p unsuccessful PCI 03/17/14), ascending thoracic aortic aneurysm (5.1 cm 04/14/23), aortic stenosis, renal cell carcinoma (robotic assisted laparoscopic right partal nephrectomy 01/14/12), spinal surgery (C4-5 ACDF 04/29/21, back surgery 1980's)  He is for ABG on the day of surgery.  05/14/2023 presurgical COVID-19 test negative.  VS: BP 113/81   Pulse (!) 58   Temp 36.6 C   Resp 18   Ht 5\' 8"  (1.727 m)   Wt 76.7 kg   SpO2 100%   BMI 25.70 kg/m   PROVIDERS: [name], PA-C is PCP at [facility] Isidoro Donning, MD is cardiologist   LABS: Labs reviewed: Acceptable for surgery. (all labs ordered are listed, but only abnormal results are displayed)  Labs Reviewed  COMPREHENSIVE METABOLIC PANEL - Abnormal; Notable for the following components:      Result Value   Glucose, Bld 118 (*)    Total Protein 6.4 (*)    All other components within normal limits  URINALYSIS, ROUTINE W REFLEX MICROSCOPIC - Abnormal; Notable for the following components:   APPearance HAZY (*)    Hgb urine dipstick SMALL (*)    Bacteria, UA RARE (*)    All other components within normal limits  HEMOGLOBIN A1C - Abnormal; Notable for the following components:   Hgb A1c MFr Bld 5.8 (*)    All other components within normal limits  SURGICAL PCR SCREEN  SARS CORONAVIRUS 2 (TAT 6-24 HRS)  CBC  PROTIME-INR  APTT  TYPE AND SCREEN     IMAGES: CTA Chest/abd/pelvis  04/14/23: IMPRESSION: 1. Vascular findings and measurements pertinent to potential TAVR procedure, as detailed above. 2. Thickening and calcification of the aortic valve, compatible with reported clinical history of aortic stenosis. 3. Severe aortoiliac atherosclerosis. Severe coronary artery calcifications of the LAD and RCA. 4. Ascending thoracic aortic aneurysm measuring up to 5.1 cm. Ascending thoracic aortic aneurysm. Recommend semi-annual imaging followup by CTA or MRA and referral to cardiothoracic surgery if not already obtained. This recommendation follows 2010 ACCF/AHA/AATS/ACR/ASA/SCA/SCAI/SIR/STS/SVM Guidelines for the Diagnosis and Management of Patients With Thoracic Aortic Disease. Circulation. 2010; 121: A540-J811. Aortic aneurysm NOS (ICD10-I71.9)    EKG: 05/14/23:  Sinus rhythm with Premature atrial complexes T wave abnormality, consider inferior ischemia   CV: US Carotid 05/14/23: Summary:  Right Carotid: Velocities in the right ICA are consistent with a 1-39%  stenosis.  Left Carotid: The extracranial vessels were near-normal with only minimal  wall thickening or plaque.  Vertebrals:  Bilateral vertebral arteries demonstrate antegrade flow.  Subclavians: Normal flow hemodynamics were seen in bilateral subclavian               arteries.    CT Coronary 04/14/23: IMPRESSION: 1. Bicuspid aortic valve with fusion of right and left coronary cusps. Severe aortic valve calcifications (AV calcium score 2289) 2.  Ascending aortic aneurysm measuring 51mm x 50mm 3. Aortic annulus measures 28mm x 22mm in diameter with perimeter 79mm  and area 463mm^2. No annular or LVOT calcifications. Annular measurements are suitable for delivery of 26mm Edwards Sapien 3 valve, though in setting of ascending aortic aneurysm as above should be evaluated for surgical AVR/aortic aneurysm repair 4. Low coronary height to RCA (9mm). Sufficient coronary height to left main (13mm) 5.   Optimum Fluoroscopic Angle for Delivery:  RAO 18 CAU 10 6.  Coronary calcium score 561 (80th percentile)   RHC/LHC 04/03/23:   Prox RCA lesion is 100% stenosed.   There is severe aortic valve stenosis.   1.  Known severe aortic stenosis by noninvasive assessment, likely bicuspid valve 2.  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  Diffuse nonobstructive plaquing in the left main, LAD, and left circumflex with no significant obstruction. 4.  Normal right heart pressures   Recommendations: Continued evaluation for aortic valve replacement and ascending aortic aneurysm repair.  Recommend CTA studies followed by surgical consultation.   Echo 11/14/22: IMPRESSIONS   1. Left ventricular ejection fraction, by estimation, is 55 to 60%. The  left ventricle has normal function. The left ventricle has no regional  wall motion abnormalities. There is mild left ventricular hypertrophy.  Left ventricular diastolic parameters  are consistent with Grade I diastolic dysfunction (impaired relaxation).  The average left ventricular global longitudinal strain is -19.0 %. The  global longitudinal strain is normal.   2. Right ventricular systolic function is normal. The right ventricular  size is normal. Tricuspid regurgitation signal is inadequate for assessing  PA pressure.   3. The mitral valve is normal in structure. Trivial mitral valve  regurgitation. No evidence of mitral stenosis.   4. The tricuspid valve is abnormal.   5. The aortic valve has an indeterminant number of cusps. Aortic valve  regurgitation is moderate. Severe aortic valve stenosis.   6. Limited visualization of the ascending aorta, the visualized parts are  at least moderately dilated. Consider additional imaging with CTA or MRA  to better evalute degree and extent of aortic aneurysm. Marland Kitchen Aortic  dilatation noted. There is mild  dilatation of the aortic root, measuring 42 mm. There is moderate   dilatation of the ascending aorta, measuring 48 mm.   7. The inferior vena cava is normal in size with greater than 50%  respiratory variability, suggesting right atrial pressure of 3 mmHg.    Past Medical History:  Diagnosis Date   Arthritis    Coronary atherosclerosis of native coronary artery    a. NSTEMI (02/2014):  LHC (02/2014):  Mild disease in LAD and CFX; prox RCA occluded with R-R collats, dist RCA filled by L-R collats, inf HK, EF 55%, LVEDP 15 mmHg.  PCI:  Unsuccessful angioplasty of RCA (late presentation of inf MI) - tx medically.   Essential hypertension    GERD (gastroesophageal reflux disease)    Hyperlipidemia    NSTEMI (non-ST elevated myocardial infarction) (HCC) 03/11/14   Renal cell carcinoma of right kidney (HCC)    Partial nephrectomy in 2013    Past Surgical History:  Procedure Laterality Date   BACK SURGERY     1989   CERVICAL DISC SURGERY  04/29/2021   COLONOSCOPY N/A 12/11/2020   Procedure: COLONOSCOPY;  Surgeon: Franky Macho, MD;  Location: [facility] END[facility] SUITE;  Service: Gastroenterology;  Laterality: N/A;   LEFT HEART CATHETERIZATION WITH CORONARY ANGIOGRAM N/A 03/17/2014   Procedure: LEFT HEART CATHETERIZATION WITH CORONARY ANGIOGRAM;  Surgeon: Corky Crafts, MD;  Location: [facility] CATH LAB;  Service:  Cardiovascular;  Laterality: N/A;   POLYPECTOMY  12/11/2020   Procedure: POLYPECTOMY;  Surgeon: Franky Macho, MD;  Location: [facility] SUITE;  Service: Gastroenterology;;   RIGHT HEART CATH AND CORONARY ANGIOGRAPHY N/A 04/03/2023   Procedure: RIGHT HEART CATH AND CORONARY ANGIOGRAPHY;  Surgeon: Tonny Bollman, MD;  Location: [facility] INVASIVE CV LAB;  Service: Cardiovascular;  Laterality: N/A;   ROBOT ASSISTED LAPAROSCOPIC NEPHRECTOMY  01/14/2012   Procedure: ROBOTIC ASSISTED LAPAROSCOPIC NEPHRECTOMY;  Surgeon: Milford Cage, MD;  Location: [facility] ORS;  Service: Urology;  Laterality: Right;  Robot Laparoscopic Right Partial Nephrectomy     TONSILLECTOMY       MEDICATIONS:  albuterol (VENTOLIN HFA) 108 (90 Base) MCG/ACT inhaler   amLODipine (NORVASC) 5 MG tablet   aspirin EC 81 MG EC tablet   atorvastatin (LIPITOR) 80 MG tablet   baclofen (LIORESAL) 10 MG tablet   finasteride (PROSCAR) 5 MG tablet   losartan (COZAAR) 100 MG tablet   metoprolol succinate (TOPROL-XL) 25 MG 24 hr tablet   No current facility-administered medications for this encounter.    Shonna Chock, PA-C Surgical Short Stay/Anesthesiology [facility] Phone [phone] [facility] Phone [phone] 05/15/2023 10:03 AM

## 2023-05-16 NOTE — H&P (Signed)
[address]       [name] [address]             [phone]      Cardiothoracic Surgery Admission History and Physical   PCP is [facility] Referring Provider is Jonelle Sidle, MD       Chief Complaint  Patient presents with   Aortic Stenosis        Thoracic Aortic Aneurysm           HPI:   The patient is a 67 year old gentleman with a history of hyperlipidemia, coronary artery disease status post NSTEMI in 2015 with catheterization showing an occluded proximal RCA with right to right collaterals filling the distal vessel that could not be opened, renal cell carcinoma status post partial right nephrectomy in 2013, severe degenerative spine disease, and newly diagnosed severe aortic stenosis and ascending aortic aneurysm who was referred for consideration of surgical treatment.  He reports a several year history of low back pain and leg numbness, pain and weakness that he has seen multiple physicians for.  He had previous lower back surgery in the 1980s and a cervical disc surgery in 2022.  He reports generalized weakness and fatigue as well as some shortness of breath with activity.  He denies any chest pain or pressure.  He has had some dizziness and reports having blacked out a few times.  He has a lot of difficulty bending over due to profound weakness.  He had a 2D echocardiogram performed on 11/14/2022 due to a heart murmur which he said he had no prior knowledge of.  It showed an indeterminate number of cusps.  There is severe aortic stenosis with a mean gradient of 42 mmHg and a peak gradient of 66 mmHg.  Valve area by VTI was 0.74 cm with a dimensionless index of 0.26.  There is moderate aortic insufficiency.  Left ventricular ejection fraction was 55 to 60% with mild LVH and grade 1 diastolic dysfunction.  Cardiac catheterization on 04/03/2023 showed a known chronic total occlusion of the RCA with right to right collaterals filling the  distal vessel.  There is nonobstructive plaquing in the left main, LAD, and left circumflex without obstruction.  Right heart pressures were normal.  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  Aortic valve calcium score was 2289.  There is a 5.1 x 5.0 cm fusiform ascending aortic aneurysm.   He lives with his wife who he takes care of.  She is a third time cancer survivor.         Past Medical History:  Diagnosis Date   Arthritis     Coronary atherosclerosis of native coronary artery      a. NSTEMI (02/2014):  LHC (02/2014):  Mild disease in LAD and CFX; prox RCA occluded with R-R collats, dist RCA filled by L-R collats, inf HK, EF 55%, LVEDP 15 mmHg.  PCI:  Unsuccessful angioplasty of RCA (late presentation of inf MI) - tx medically.   Essential hypertension     GERD (gastroesophageal reflux disease)     Hyperlipidemia     NSTEMI (non-ST elevated myocardial infarction) (HCC) 03/11/14   Renal cell carcinoma of right kidney (HCC)      Partial nephrectomy in 2013           Past Surgical History:  Procedure Laterality Date   BACK SURGERY  1989   CERVICAL DISC SURGERY   04/29/2021   COLONOSCOPY N/A 12/11/2020    Procedure: COLONOSCOPY;  Surgeon: Franky Macho, MD;  Location: [facility];  Service: Gastroenterology;  Laterality: N/A;   LEFT HEART CATHETERIZATION WITH CORONARY ANGIOGRAM N/A 03/17/2014    Procedure: LEFT HEART CATHETERIZATION WITH CORONARY ANGIOGRAM;  Surgeon: Corky Crafts, MD;  Location: [facility] CATH LAB;  Service: Cardiovascular;  Laterality: N/A;   POLYPECTOMY   12/11/2020    Procedure: POLYPECTOMY;  Surgeon: Franky Macho, MD;  Location: [facility];  Service: Gastroenterology;;   RIGHT HEART CATH AND CORONARY ANGIOGRAPHY N/A 04/03/2023    Procedure: RIGHT HEART CATH AND CORONARY ANGIOGRAPHY;  Surgeon: Tonny Bollman, MD;  Location: [facility] INVASIVE CV LAB;  Service: Cardiovascular;  Laterality: N/A;    ROBOT ASSISTED LAPAROSCOPIC NEPHRECTOMY   01/14/2012    Procedure: ROBOTIC ASSISTED LAPAROSCOPIC NEPHRECTOMY;  Surgeon: Milford Cage, MD;  Location: [facility];  Service: Urology;  Laterality: Right;  Robot Laparoscopic Right Partial Nephrectomy       TONSILLECTOMY               Family History  Problem Relation Age of Onset   Renal Disease Daughter     Hypertension Daughter        Social History Social History         Tobacco Use   Smoking status: Former      Types: Cigarettes      Quit date: 11/18/1979      Years since quitting: 43.4   Smokeless tobacco: Never  Vaping Use   Vaping Use: Never used  Substance Use Topics   Alcohol use: No      Alcohol/week: 0.0 standard drinks of alcohol   Drug use: Yes      Types: Marijuana            Current Outpatient Medications  Medication Sig Dispense Refill   albuterol (VENTOLIN HFA) 108 (90 Base) MCG/ACT inhaler Inhale 2 puffs into the lungs 2 (two) times daily.       amLODipine (NORVASC) 5 MG tablet Take 1 tablet (5 mg total) by mouth daily. 90 tablet 3   aspirin EC 81 MG EC tablet Take 1 tablet (81 mg total) by mouth daily.       atorvastatin (LIPITOR) 80 MG tablet Take 1 tablet (80 mg total) by mouth daily. 90 tablet 3   baclofen (LIORESAL) 10 MG tablet Take 1 tablet (10 mg total) by mouth 3 (three) times daily. (Patient taking differently: Take 10 mg by mouth daily.) 90 each 5   finasteride (PROSCAR) 5 MG tablet Take 5 mg by mouth daily.   3   losartan (COZAAR) 100 MG tablet Take 1 tablet (100 mg total) by mouth daily. 90 tablet 1   metoprolol succinate (TOPROL-XL) 25 MG 24 hr tablet TAKE 1 TABLET ONCE DAILY. 90 tablet 1    No current facility-administered medications for this visit.           Allergies  Allergen Reactions   Alcohol Other (See Comments)      Pts skin gets red. Ex. Pt used alcohol based deodorant and got red things under arms. & Drinks alcohol notices "from heart on up" everything turns red.    Chlorthalidone        Dizziness and Syncopal Episode      Review of Systems  Constitutional:  Positive for activity change and fatigue.  HENT:  Positive for dental problem.   Eyes:  Floaters  Respiratory:  Positive for shortness of breath.   Cardiovascular:  Negative for chest pain, palpitations and leg swelling.  Gastrointestinal:  Positive for constipation.  Endocrine: Negative.   Genitourinary:  Positive for frequency.       BPH  Musculoskeletal:  Positive for arthralgias and back pain.       Difficulty walking.  Skin: Negative.   Allergic/Immunologic: Negative.   Neurological:  Positive for dizziness and syncope.       Pain and numbness in his legs with walking  Hematological:  Bruises/bleeds easily.  Psychiatric/Behavioral: Negative.        BP (!) 141/95   Pulse 62   Resp 20   Ht 5\' 8"  (1.727 m)   Wt 170 lb (77.1 kg)   SpO2 98% Comment: RA  BMI 25.85 kg/m  Physical Exam Constitutional:      Appearance: Normal appearance. He is normal weight.  HENT:     Head: Normocephalic and atraumatic.     Mouth/Throat:     Comments: Poor dentition with multiple missing and broken off teeth Eyes:     Extraocular Movements: Extraocular movements intact.     Conjunctiva/sclera: Conjunctivae normal.     Pupils: Pupils are equal, round, and reactive to light.  Neck:     Vascular: No carotid bruit.  Cardiovascular:     Rate and Rhythm: Normal rate and regular rhythm.     Pulses: Normal pulses.     Heart sounds: Murmur heard.     Comments: 2/6 systolic murmur along the right sternal border.  There is no diastolic murmur. Pulmonary:     Effort: Pulmonary effort is normal.     Breath sounds: Normal breath sounds.  Abdominal:     General: Abdomen is flat. There is no distension.     Palpations: Abdomen is soft.     Tenderness: There is no abdominal tenderness.  Musculoskeletal:        General: No swelling.     Cervical back: Normal range of motion and neck supple.   Skin:    General: Skin is warm and dry.  Neurological:     General: No focal deficit present.     Mental Status: He is alert and oriented to person, place, and time.  Psychiatric:        Mood and Affect: Mood normal.        Behavior: Behavior normal.          Diagnostic Tests:   ECHOCARDIOGRAM REPORT       Patient Name:   SARITH WESTLY Date of Exam: 11/14/2022  Medical Rec #:  [id number]      Height:       68.0 in  Accession #:    [id number]     Weight:       173.0 lb  Date of Birth:  [birth year]     BSA:          1.922 m  Patient Age:    66 years       BP:           136/82 mmHg  Patient Gender: M              HR:           52 bpm.  Exam Location:  [place]   Procedure: 2D Echo, Cardiac Doppler, Color Doppler and Strain Analysis   Indications:    R01.1 Murmur    History:  Patient has no prior history of Echocardiogram  examinations.                 CAD, Reanl cell cancer, Aortic Valve Disease,                  Arrythmias:Tachycardia; Risk Factors:Hypertension,  Dyslipidemia                 and Former Smoker.    Sonographer:    Jake Seats RDMS, RVT, RDCS  Referring Phys: [id number] ELIZABETH PECK   IMPRESSIONS     1. Left ventricular ejection fraction, by estimation, is 55 to 60%. The  left ventricle has normal function. The left ventricle has no regional  wall motion abnormalities. There is mild left ventricular hypertrophy.  Left ventricular diastolic parameters  are consistent with Grade I diastolic dysfunction (impaired relaxation).  The average left ventricular global longitudinal strain is -19.0 %. The  global longitudinal strain is normal.   2. Right ventricular systolic function is normal. The right ventricular  size is normal. Tricuspid regurgitation signal is inadequate for assessing  PA pressure.   3. The mitral valve is normal in structure. Trivial mitral valve  regurgitation. No evidence of mitral stenosis.   4. The tricuspid valve is abnormal.    5. The aortic valve has an indeterminant number of cusps. Aortic valve  regurgitation is moderate. Severe aortic valve stenosis.   6. Limited visualization of the ascending aorta, the visualized parts are  at least moderately dilated. Consider additional imaging with CTA or MRA  to better evalute degree and extent of aortic aneurysm. Marland Kitchen Aortic  dilatation noted. There is mild  dilatation of the aortic root, measuring 42 mm. There is moderate  dilatation of the ascending aorta, measuring 48 mm.   7. The inferior vena cava is normal in size with greater than 50%  respiratory variability, suggesting right atrial pressure of 3 mmHg.   FINDINGS   Left Ventricle: Left ventricular ejection fraction, by estimation, is 55  to 60%. The left ventricle has normal function. The left ventricle has no  regional wall motion abnormalities. The average left ventricular global  longitudinal strain is -19.0 %.  The global longitudinal strain is normal. The left ventricular internal  cavity size was normal in size. There is mild left ventricular  hypertrophy. Left ventricular diastolic parameters are consistent with  Grade I diastolic dysfunction (impaired  relaxation). Normal left ventricular filling pressure.   Right Ventricle: The right ventricular size is normal. Right vetricular  wall thickness was not well visualized. Right ventricular systolic  function is normal. Tricuspid regurgitation signal is inadequate for  assessing PA pressure.   Left Atrium: Left atrial size was normal in size.   Right Atrium: Right atrial size was normal in size.   Pericardium: There is no evidence of pericardial effusion.   Mitral Valve: The mitral valve is normal in structure. Trivial mitral  valve regurgitation. No evidence of mitral valve stenosis.   Tricuspid Valve: The tricuspid valve is abnormal. Tricuspid valve  regurgitation is mild . No evidence of tricuspid stenosis.   Aortic Valve: The aortic valve  has an indeterminant number of cusps.  Aortic valve regurgitation is moderate. Severe aortic stenosis is present.  Aortic valve mean gradient measures 43.0 mmHg. Aortic valve peak gradient  measures 66.0 mmHg. Aortic valve  area, by VTI measures 0.74 cm. Aortic valve mean gradient measures 43.0  mmHg. Aortic valve peak gradient measures 66.0 mmHg. Aortic valve area,  by  VTI measures 0.74 cm. DI 0.26     Pulmonic Valve: The pulmonic valve was not well visualized. Pulmonic valve  regurgitation is not visualized. No evidence of pulmonic stenosis.   Aorta: Limited visualization of the ascending aorta, the visualized parts  are at least moderately dilated. Consider additional imaging with CTA or  MRA to better evalute degree and extent of aortic aneurysm. Aortic  dilatation noted. There is mild  dilatation of the aortic root, measuring 42 mm. There is moderate  dilatation of the ascending aorta, measuring 48 mm.   Venous: The inferior vena cava is normal in size with greater than 50%  respiratory variability, suggesting right atrial pressure of 3 mmHg.   IAS/Shunts: No atrial level shunt detected by color flow Doppler.     LEFT VENTRICLE  PLAX 2D  LVIDd:         4.50 cm     Diastology  LVIDs:         3.35 cm     LV e' medial:    4.70 cm/s  LV PW:         1.10 cm     LV E/e' medial:  15.1  LV IVS:        1.15 cm     LV e' lateral:   5.92 cm/s  LVOT diam:     1.90 cm     LV E/e' lateral: 12.0  LV SV:         74  LV SV Index:   38          2D Longitudinal Strain  LVOT Area:     2.84 cm    2D Strain GLS (A2C):   -19.4 %                             2D Strain GLS (A3C):   -18.7 %                             2D Strain GLS (A4C):   -18.8 %  LV Volumes (MOD)           2D Strain GLS Avg:     -19.0 %  LV vol d, MOD A2C: 59.7 ml  LV vol d, MOD A4C: 57.2 ml  LV vol s, MOD A2C: 22.8 ml  LV vol s, MOD A4C: 22.0 ml  LV SV MOD A2C:     36.9 ml  LV SV MOD A4C:     57.2 ml  LV SV MOD BP:       37.6 ml   RIGHT VENTRICLE  RV S prime:     10.20 cm/s  TAPSE (M-mode): 2.0 cm   LEFT ATRIUM             Index        RIGHT ATRIUM           Index  LA diam:        2.90 cm 1.51 cm/m   RA Area:     12.70 cm  LA Vol (A2C):   36.5 ml 18.99 ml/m  RA Volume:   26.50 ml  13.79 ml/m  LA Vol (A4C):   27.9 ml 14.52 ml/m  LA Biplane Vol: 31.9 ml 16.60 ml/m   AORTIC VALVE  AV Area (Vmax):    0.74 cm  AV Area (Vmean):   0.76 cm  AV  Area (VTI):     0.74 cm  AV Vmax:           406.33 cm/s  AV Vmean:          298.333 cm/s  AV VTI:            1.000 m  AV Peak Grad:      66.0 mmHg  AV Mean Grad:      43.0 mmHg  LVOT Vmax:         106.00 cm/s  LVOT Vmean:        80.000 cm/s  LVOT VTI:          0.260 m  LVOT/AV VTI ratio: 0.26    AORTA  Ao Root diam: 4.20 cm  Ao Asc diam:  4.80 cm  Ao Arch diam: 3.0 cm   MITRAL VALVE  MV Area (PHT): 2.39 cm     SHUNTS  MV Decel Time: 317 msec     Systemic VTI:  0.26 m  MR Peak grad: 63.4 mmHg     Systemic Diam: 1.90 cm  MR Vmax:      398.00 cm/s  MV E velocity: 71.10 cm/s  MV A velocity: 112.00 cm/s  MV E/A ratio:  0.63   Dina Rich MD  Electronically signed by Dina Rich MD  Signature Date/Time: 11/18/2022/6:29:34 PM        Final      Physicians   Panel Physicians Referring Physician Case Authorizing Physician  Tonny Bollman, MD (Primary)        Procedures   RIGHT HEART CATH AND CORONARY ANGIOGRAPHY    Conclusion       Prox RCA lesion is 100% stenosed.   There is severe aortic valve stenosis.   1.  Known severe aortic stenosis by noninvasive assessment, likely bicuspid valve 2.  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  Diffuse nonobstructive plaquing in the left main, LAD, and left circumflex with no significant obstruction. 4.  Normal right heart pressures   Recommendations: Continued evaluation for aortic valve replacement and ascending aortic aneurysm repair.  Recommend CTA  studies followed by surgical consultation.   Indications   Severe aortic stenosis [I35.0 (ICD-10-CM)]    Procedural Details   Technical Details INDICATION: Severe aortic stenosis, aortic aneurysm. Preop study.  PROCEDURAL DETAILS: There was an indwelling IV in a right antecubital vein. Using normal sterile technique, the IV was changed out for a 5 Fr brachial sheath over a 0.018 inch wire. The right wrist was then prepped, draped, and anesthetized with 1% lidocaine. Using the modified Seldinger technique a 5/6 French Slender sheath was placed in the right radial artery. Intra-arterial verapamil was administered through the radial artery sheath. IV heparin was administered after a JR4 catheter was advanced into the central aorta. A Swan-Ganz catheter was used for the right heart catheterization. Standard protocol was followed for recording of right heart pressures and sampling of oxygen saturations. Fick cardiac output was calculated. Standard Judkins catheters were used for selective coronary angiography. There were no immediate procedural complications. The patient was transferred to the post catheterization recovery area for further monitoring.      Estimated blood loss <50 mL.   During this procedure medications were administered to achieve and maintain moderate conscious sedation while the patient's heart rate, blood pressure, and oxygen saturation were continuously monitored and I was present face-to-face 100% of this time.    Medications (Filter: Administrations occurring from 0915 to  1020 on 04/03/23)  important  Continuous medications are totaled by the amount administered until 04/03/23 1020.    midazolam (VERSED) injection (mg)  Total dose: 2 mg Date/Time Rate/Dose/Volume Action    04/03/23 0951 2 mg Given    fentaNYL (SUBLIMAZE) injection (mcg)  Total dose: 25 mcg Date/Time Rate/Dose/Volume Action    04/03/23 0951 25 mcg Given    lidocaine (PF) (XYLOCAINE) 1 % injection  (mL)  Total volume: 4 mL Date/Time Rate/Dose/Volume Action    04/03/23 0953 2 mL Given    0954 2 mL Given    Radial Cocktail/Verapamil only (mL)  Total volume: 10 mL Date/Time Rate/Dose/Volume Action    04/03/23 0955 10 mL Given    heparin sodium (porcine) injection (Units)  Total dose: 4,000 Units Date/Time Rate/Dose/Volume Action    04/03/23 1003 4,000 Units Given    iohexol (OMNIPAQUE) 350 MG/ML injection (mL)  Total volume: 40 mL Date/Time Rate/Dose/Volume Action    04/03/23 1010 40 mL Given    Heparin (Porcine) in NaCl 1000-0.9 UT/500ML-% SOLN (mL)  Total volume: 1,000 mL Date/Time Rate/Dose/Volume Action    04/03/23 1010 500 mL Given    1010 500 mL Given      Sedation Time   Sedation Time Physician-1: 14 minutes 52 seconds Contrast        Administrations occurring from 0915 to 1020 on 04/03/23:  Medication Name Total Dose  iohexol (OMNIPAQUE) 350 MG/ML injection 40 mL    Radiation/Fluoro   Fluoro time: 3.2 (min) DAP: 11741 (mGycm2) Cumulative Air Kerma: 167 (mGy) Complications   Complications documented before study signed (04/03/2023 10:25 AM)    No complications were associated with this study.  Documented by Velora Heckler, RT - 04/03/2023 10:18 AM      Coronary Findings   Diagnostic Dominance: Right Left Main  The vessel exhibits minimal luminal irregularities.    Left Anterior Descending  There is mild diffuse disease throughout the vessel. The LAD reaches the apex. There is mild diffuse plaquing. The proximal LAD is ectatic before the origin of the first diagonal. The first diagonal supplies a large territory myocardium. There is no obstructive disease throughout the LAD or diagonal territories.    Left Circumflex  There is mild diffuse disease throughout the vessel.    Right Coronary Artery  The right coronary artery is occluded proximally. There is a well-formed bridging collateral and the distal vessel fills from this.  Collaterals  Mid RCA  filled by collaterals from Ost RCA.     Prox RCA lesion is 100% stenosed.    Intervention    No interventions have been documented.    Left Heart   Aortic Valve There is severe aortic valve stenosis. The aortic valve is calcified. There is restricted aortic valve motion.    Coronary Diagrams   Diagnostic Dominance: Right  Intervention    Implants    No implant documentation for this case.    Syngo Images    Show images for CARDIAC CATHETERIZATION Images on Long Term Storage    Show images for Norbert, Dufrene to Procedure Log   Procedure Log    Hemo Data   Flowsheet Row Most Recent Value  Fick Cardiac Output 5.8 L/min  Fick Cardiac Output Index 3.04 (L/min)/BSA  RA A Wave 9 mmHg  RA V Wave 6 mmHg  RA Mean 5 mmHg  RV Systolic Pressure 32 mmHg  RV Diastolic Pressure 2 mmHg  RV EDP 7 mmHg  PA Systolic Pressure 29  mmHg  PA Diastolic Pressure 8 mmHg  PA Mean 17 mmHg  PW A Wave 9 mmHg  PW V Wave 7 mmHg  PW Mean 9 mmHg  AO Systolic Pressure 130 mmHg  AO Diastolic Pressure 77 mmHg  AO Mean 99 mmHg  QP/QS 1  TPVR Index 5.58 HRUI  TSVR Index 32.51 HRUI  PVR SVR Ratio 0.09  TPVR/TSVR Ratio 0.17      ADDENDUM REPORT: 04/15/2023 13:58   EXAM: OVER-READ INTERPRETATION  CT CHEST   The following report is an over-read performed by radiologist Dr. Jacob Moores [facility] on 04/15/2023. This over-read does not include interpretation of cardiac or coronary anatomy or pathology. The cardiac TAVR interpretation by the cardiologist is attached.   COMPARISON:  None.   FINDINGS: Extracardiac findings will be described separately under dictation for contemporaneously obtained CTA chest, abdomen and pelvis.   IMPRESSION: Please see separate dictation for contemporaneously obtained CTA chest, abdomen and pelvis dated 04/15/2023 for full description of relevant extracardiac findings.     Electronically Signed   By: Allegra Lai M.D.    On: 04/15/2023 13:58    Addended by Renford Dills, MD on 04/15/2023  2:01 PM    Study Result   Narrative & Impression  CLINICAL DATA:  [phone] with severe aortic stenosis being evaluated for a TAVR procedure.   EXAM: Cardiac TAVR CT   TECHNIQUE: The patient was scanned on a [employer]. A 120 kV retrospective scan was triggered in the descending thoracic aorta at 111 HU's. Gantry rotation speed was 250 msecs and collimation was .6 mm. No beta blockade or nitro were given. The 3D data set was reconstructed in 5% intervals of the R-R cycle. Systolic and diastolic phases were analyzed on a dedicated work station using MPR, MIP and VRT modes. The patient received 100 cc of contrast.   FINDINGS: Aortic Root:   Aortic valve: Bicuspid aortic valve with fusion of left and right coronary cusps   Aortic valve calcium score: 2289   Aortic annulus:   Diameter: 28mm x 22mm   Perimeter: 79mm   Area: 481 mm^2   Calcifications: No calcifications   Coronary height: Min Left - 13mm; Min Right - 9mm   Sinotubular height: Left cusp - 21mm; Right cusp - 14mm; Noncoronary cusp - 26mm   LVOT (as measured 3 mm below the annulus):   Diameter: 30mm x 22mm   Area: 499 mm^2   Calcifications: No calcifications   Aortic sinus width: Left cusp - 36mm; Right cusp - 35mm; Noncoronary cusp - 38mm   Sinotubular junction width: 38mm x 37mm   Ascending aorta: Aneurysm measuring 51mm x 50mm   Optimum Fluoroscopic Angle for Delivery: RAO 18 CAU 10   Cardiac:   Right atrium: Mild enlargement   Right ventricle: Normal size   Pulmonary arteries: Normal size   Pulmonary veins: Normal configuration   Left atrium: Normal size   Left ventricle: Normal size   Pericardium: Normal thickness   Coronary arteries: Normal origins. Calcium score 561 (80th percentile)   IMPRESSION: 1. Bicuspid aortic valve with fusion of right and left coronary cusps. Severe aortic valve  calcifications (AV calcium score 2289)   2.  Ascending aortic aneurysm measuring 51mm x 50mm   3. Aortic annulus measures 28mm x 22mm in diameter with perimeter 79mm and area 429mm^2. No annular or LVOT calcifications. Annular measurements are suitable for delivery of 26mm Edwards Sapien 3 valve, though in setting of ascending aortic  aneurysm as above should be evaluated for surgical AVR/aortic aneurysm repair   4. Low coronary height to RCA (9mm). Sufficient coronary height to left main (13mm)   5.  Optimum Fluoroscopic Angle for Delivery:  RAO 18 CAU 10   6.  Coronary calcium score 561 (80th percentile)   Electronically Signed: By: Epifanio Lesches M.D. On: 04/14/2023 12:42          Impression:   This 67 year old gentleman has stage D, severe, symptomatic aortic stenosis with NYHA class II symptoms of exertional fatigue and shortness of breath consistent with chronic diastolic congestive heart failure.  He has also been having some episodes of dizziness and syncope.  His echocardiogram shows a heavily calcified aortic valve with restricted leaflet mobility.  The mean gradient was 43 mmHg consistent with severe aortic stenosis.  There is moderate aortic insufficiency.  Left ventricular ejection fraction is normal.  Cardiac catheterization shows a known chronic total occlusion of the RCA with right to right collaterals filling the distal vessel.  There is otherwise no significant disease.  Gated cardiac CTA shows a bicuspid aortic valve with a 5.1 cm fusiform ascending aortic aneurysm.  The aortic root appears to be fairly normal size with the aneurysm occurring above the sinotubular junction.  I agree that aortic valve replacement is indicated in this patient for relief of his symptoms. Given his relatively young age and significant ascending aortic aneurysm I think that open surgical AVR and replacement of the ascending aorta is the best treatment for him.  I reviewed the echo, cath,  and CT images with him and answered all of his questions.  I recommend a bioprosthetic valve given his age and comorbidities. He does have significant dental disease and has been referred to Dr. Dutch Quint for evaluation and treatment as indicated. I discussed the operative procedure with the patient  including alternatives, benefits and risks; including but not limited to bleeding, blood transfusion, infection, stroke, myocardial infarction, graft failure, heart block requiring a permanent pacemaker, organ dysfunction, and death.  Bayard Beaver understands and agrees to proceed.     Plan:   Aortic valve replacement and replacement of the ascending aortic aneurysm using circulatory arrest.      Alleen Borne, MD [facility] [phone]

## 2023-05-18 ENCOUNTER — Encounter (HOSPITAL_COMMUNITY): Payer: Self-pay | Admitting: Surgery

## 2023-05-18 ENCOUNTER — Inpatient Hospital Stay (HOSPITAL_COMMUNITY): Payer: Medicare Other | Admitting: Anesthesiology

## 2023-05-18 ENCOUNTER — Inpatient Hospital Stay (HOSPITAL_COMMUNITY): Payer: Medicare Other

## 2023-05-18 ENCOUNTER — Other Ambulatory Visit: Payer: Self-pay

## 2023-05-18 ENCOUNTER — Encounter (HOSPITAL_COMMUNITY)
Admission: RE | Disposition: A | Discharge status: Rehabilitation Facility | Payer: Self-pay | Source: Home / Self Care | Attending: Surgery

## 2023-05-18 ENCOUNTER — Inpatient Hospital Stay (HOSPITAL_COMMUNITY)
Admission: RE | Admit: 2023-05-18 | Discharge: 2023-05-25 | DRG: 219 | Disposition: A | Discharge status: Rehabilitation Facility | Payer: Medicare Other | Attending: Surgery | Admitting: Surgery

## 2023-05-18 DIAGNOSIS — Z7901 Long term (current) use of anticoagulants: Secondary | ICD-10-CM

## 2023-05-18 DIAGNOSIS — I493 Ventricular premature depolarization: Secondary | ICD-10-CM | POA: Diagnosis not present

## 2023-05-18 DIAGNOSIS — Z905 Acquired absence of kidney: Secondary | ICD-10-CM | POA: Diagnosis not present

## 2023-05-18 DIAGNOSIS — Z8249 Family history of ischemic heart disease and other diseases of the circulatory system: Secondary | ICD-10-CM

## 2023-05-18 DIAGNOSIS — Z952 Presence of prosthetic heart valve: Principal | ICD-10-CM

## 2023-05-18 DIAGNOSIS — I2582 Chronic total occlusion of coronary artery: Secondary | ICD-10-CM | POA: Diagnosis present

## 2023-05-18 DIAGNOSIS — T8383XA Hemorrhage of genitourinary prosthetic devices, implants and grafts, initial encounter: Secondary | ICD-10-CM | POA: Diagnosis not present

## 2023-05-18 DIAGNOSIS — I2510 Atherosclerotic heart disease of native coronary artery without angina pectoris: Secondary | ICD-10-CM | POA: Diagnosis present

## 2023-05-18 DIAGNOSIS — I69351 Hemiplegia and hemiparesis following cerebral infarction affecting right dominant side: Secondary | ICD-10-CM | POA: Diagnosis not present

## 2023-05-18 DIAGNOSIS — I6340 Cerebral infarction due to embolism of unspecified cerebral artery: Secondary | ICD-10-CM | POA: Diagnosis not present

## 2023-05-18 DIAGNOSIS — E785 Hyperlipidemia, unspecified: Secondary | ICD-10-CM | POA: Diagnosis present

## 2023-05-18 DIAGNOSIS — I69331 Monoplegia of upper limb following cerebral infarction affecting right dominant side: Secondary | ICD-10-CM | POA: Diagnosis present

## 2023-05-18 DIAGNOSIS — N401 Enlarged prostate with lower urinary tract symptoms: Secondary | ICD-10-CM | POA: Diagnosis present

## 2023-05-18 DIAGNOSIS — I711 Thoracic aortic aneurysm, ruptured, unspecified: Secondary | ICD-10-CM

## 2023-05-18 DIAGNOSIS — Y846 Urinary catheterization as the cause of abnormal reaction of the patient, or of later complication, without mention of misadventure at the time of the procedure: Secondary | ICD-10-CM | POA: Diagnosis not present

## 2023-05-18 DIAGNOSIS — I10 Essential (primary) hypertension: Secondary | ICD-10-CM | POA: Diagnosis present

## 2023-05-18 DIAGNOSIS — D6959 Other secondary thrombocytopenia: Secondary | ICD-10-CM | POA: Diagnosis present

## 2023-05-18 DIAGNOSIS — D62 Acute posthemorrhagic anemia: Secondary | ICD-10-CM | POA: Diagnosis not present

## 2023-05-18 DIAGNOSIS — Z87891 Personal history of nicotine dependence: Secondary | ICD-10-CM | POA: Diagnosis not present

## 2023-05-18 DIAGNOSIS — Z888 Allergy status to other drugs, medicaments and biological substances status: Secondary | ICD-10-CM

## 2023-05-18 DIAGNOSIS — I4891 Unspecified atrial fibrillation: Secondary | ICD-10-CM | POA: Diagnosis present

## 2023-05-18 DIAGNOSIS — I480 Paroxysmal atrial fibrillation: Secondary | ICD-10-CM | POA: Diagnosis not present

## 2023-05-18 DIAGNOSIS — I252 Old myocardial infarction: Secondary | ICD-10-CM

## 2023-05-18 DIAGNOSIS — I639 Cerebral infarction, unspecified: Secondary | ICD-10-CM | POA: Diagnosis not present

## 2023-05-18 DIAGNOSIS — I7121 Aneurysm of the ascending aorta, without rupture: Secondary | ICD-10-CM | POA: Diagnosis present

## 2023-05-18 DIAGNOSIS — R338 Other retention of urine: Secondary | ICD-10-CM | POA: Diagnosis present

## 2023-05-18 DIAGNOSIS — G8191 Hemiplegia, unspecified affecting right dominant side: Secondary | ICD-10-CM | POA: Diagnosis present

## 2023-05-18 DIAGNOSIS — I959 Hypotension, unspecified: Secondary | ICD-10-CM | POA: Diagnosis not present

## 2023-05-18 DIAGNOSIS — Z85528 Personal history of other malignant neoplasm of kidney: Secondary | ICD-10-CM

## 2023-05-18 DIAGNOSIS — R29700 NIHSS score 0: Secondary | ICD-10-CM | POA: Diagnosis not present

## 2023-05-18 DIAGNOSIS — I97820 Postprocedural cerebrovascular infarction during cardiac surgery: Secondary | ICD-10-CM | POA: Diagnosis not present

## 2023-05-18 DIAGNOSIS — I6389 Other cerebral infarction: Secondary | ICD-10-CM | POA: Diagnosis not present

## 2023-05-18 DIAGNOSIS — Z8719 Personal history of other diseases of the digestive system: Secondary | ICD-10-CM

## 2023-05-18 DIAGNOSIS — F419 Anxiety disorder, unspecified: Secondary | ICD-10-CM | POA: Diagnosis present

## 2023-05-18 DIAGNOSIS — E8770 Fluid overload, unspecified: Secondary | ICD-10-CM | POA: Diagnosis not present

## 2023-05-18 DIAGNOSIS — K219 Gastro-esophageal reflux disease without esophagitis: Secondary | ICD-10-CM | POA: Diagnosis present

## 2023-05-18 DIAGNOSIS — I63032 Cerebral infarction due to thrombosis of left carotid artery: Secondary | ICD-10-CM | POA: Diagnosis not present

## 2023-05-18 DIAGNOSIS — K5900 Constipation, unspecified: Secondary | ICD-10-CM | POA: Diagnosis present

## 2023-05-18 DIAGNOSIS — R29810 Facial weakness: Secondary | ICD-10-CM | POA: Diagnosis not present

## 2023-05-18 DIAGNOSIS — I350 Nonrheumatic aortic (valve) stenosis: Principal | ICD-10-CM | POA: Diagnosis present

## 2023-05-18 DIAGNOSIS — Z79899 Other long term (current) drug therapy: Secondary | ICD-10-CM

## 2023-05-18 DIAGNOSIS — M5431 Sciatica, right side: Secondary | ICD-10-CM | POA: Diagnosis present

## 2023-05-18 DIAGNOSIS — M5416 Radiculopathy, lumbar region: Secondary | ICD-10-CM | POA: Diagnosis not present

## 2023-05-18 DIAGNOSIS — Z4803 Encounter for change or removal of drains: Secondary | ICD-10-CM | POA: Diagnosis not present

## 2023-05-18 DIAGNOSIS — I97190 Other postprocedural cardiac functional disturbances following cardiac surgery: Secondary | ICD-10-CM | POA: Diagnosis not present

## 2023-05-18 DIAGNOSIS — R5381 Other malaise: Secondary | ICD-10-CM | POA: Diagnosis present

## 2023-05-18 DIAGNOSIS — Y838 Other surgical procedures as the cause of abnormal reaction of the patient, or of later complication, without mention of misadventure at the time of the procedure: Secondary | ICD-10-CM | POA: Diagnosis not present

## 2023-05-18 DIAGNOSIS — R29703 NIHSS score 3: Secondary | ICD-10-CM | POA: Diagnosis not present

## 2023-05-18 DIAGNOSIS — Z7982 Long term (current) use of aspirin: Secondary | ICD-10-CM | POA: Diagnosis not present

## 2023-05-18 DIAGNOSIS — Z4801 Encounter for change or removal of surgical wound dressing: Secondary | ICD-10-CM | POA: Diagnosis not present

## 2023-05-18 DIAGNOSIS — M545 Low back pain, unspecified: Secondary | ICD-10-CM | POA: Diagnosis present

## 2023-05-18 DIAGNOSIS — R7989 Other specified abnormal findings of blood chemistry: Secondary | ICD-10-CM | POA: Diagnosis present

## 2023-05-18 DIAGNOSIS — D72829 Elevated white blood cell count, unspecified: Secondary | ICD-10-CM | POA: Diagnosis not present

## 2023-05-18 DIAGNOSIS — I69393 Ataxia following cerebral infarction: Secondary | ICD-10-CM | POA: Diagnosis not present

## 2023-05-18 HISTORY — PX: REPLACEMENT ASCENDING AORTA: SHX6068

## 2023-05-18 HISTORY — PX: TEE WITHOUT CARDIOVERSION: SHX5443

## 2023-05-18 HISTORY — PX: AORTIC VALVE REPLACEMENT: SHX41

## 2023-05-18 LAB — POCT I-STAT 7, (LYTES, BLD GAS, ICA,H+H)
Acid-base deficit: 4 mmol/L — ABNORMAL HIGH (ref 0.0–2.0)
Acid-base deficit: 4 mmol/L — ABNORMAL HIGH (ref 0.0–2.0)
Acid-base deficit: 3 mmol/L — ABNORMAL HIGH (ref 0.0–2.0)
Acid-base deficit: 5 mmol/L — ABNORMAL HIGH (ref 0.0–2.0)
Acid-base deficit: 5 mmol/L — ABNORMAL HIGH (ref 0.0–2.0)
Acid-base deficit: 8 mmol/L — ABNORMAL HIGH (ref 0.0–2.0)
Bicarbonate: 19.8 mmol/L — ABNORMAL LOW (ref 20.0–28.0)
Bicarbonate: 23.4 mmol/L (ref 20.0–28.0)
Bicarbonate: 22.4 mmol/L (ref 20.0–28.0)
Bicarbonate: 20.8 mmol/L (ref 20.0–28.0)
Bicarbonate: 19 mmol/L — ABNORMAL LOW (ref 20.0–28.0)
Bicarbonate: 16.6 mmol/L — ABNORMAL LOW (ref 20.0–28.0)
Calcium, Ion: 0.82 mmol/L — CL (ref 1.15–1.40)
Calcium, Ion: 1.02 mmol/L — ABNORMAL LOW (ref 1.15–1.40)
Calcium, Ion: 1.18 mmol/L (ref 1.15–1.40)
Calcium, Ion: 1.14 mmol/L — ABNORMAL LOW (ref 1.15–1.40)
Calcium, Ion: 1.12 mmol/L — ABNORMAL LOW (ref 1.15–1.40)
Calcium, Ion: 1.12 mmol/L — ABNORMAL LOW (ref 1.15–1.40)
HCT: 24 % — ABNORMAL LOW (ref 39.0–52.0)
HCT: 26 % — ABNORMAL LOW (ref 39.0–52.0)
HCT: 30 % — ABNORMAL LOW (ref 39.0–52.0)
HCT: 29 % — ABNORMAL LOW (ref 39.0–52.0)
HCT: 26 % — ABNORMAL LOW (ref 39.0–52.0)
HCT: 26 % — ABNORMAL LOW (ref 39.0–52.0)
Hemoglobin: 8.2 g/dL — ABNORMAL LOW (ref 13.0–17.0)
Hemoglobin: 8.8 g/dL — ABNORMAL LOW (ref 13.0–17.0)
Hemoglobin: 10.2 g/dL — ABNORMAL LOW (ref 13.0–17.0)
Hemoglobin: 9.9 g/dL — ABNORMAL LOW (ref 13.0–17.0)
Hemoglobin: 8.8 g/dL — ABNORMAL LOW (ref 13.0–17.0)
Hemoglobin: 8.8 g/dL — ABNORMAL LOW (ref 13.0–17.0)
O2 Saturation: 100 %
O2 Saturation: 100 %
O2 Saturation: 100 %
O2 Saturation: 99 %
O2 Saturation: 99 %
O2 Saturation: 99 %
Patient temperature: 36
Patient temperature: 36.6
Patient temperature: 37
Potassium: 5.9 mmol/L — ABNORMAL HIGH (ref 3.5–5.1)
Potassium: 4.1 mmol/L (ref 3.5–5.1)
Potassium: 5.1 mmol/L (ref 3.5–5.1)
Potassium: 3.9 mmol/L (ref 3.5–5.1)
Potassium: 4.4 mmol/L (ref 3.5–5.1)
Potassium: 4.2 mmol/L (ref 3.5–5.1)
Sodium: 138 mmol/L (ref 135–145)
Sodium: 130 mmol/L — ABNORMAL LOW (ref 135–145)
Sodium: 136 mmol/L (ref 135–145)
Sodium: 140 mmol/L (ref 135–145)
Sodium: 139 mmol/L (ref 135–145)
Sodium: 138 mmol/L (ref 135–145)
TCO2: 21 mmol/L — ABNORMAL LOW (ref 22–32)
TCO2: 25 mmol/L (ref 22–32)
TCO2: 24 mmol/L (ref 22–32)
TCO2: 22 mmol/L (ref 22–32)
TCO2: 20 mmol/L — ABNORMAL LOW (ref 22–32)
TCO2: 17 mmol/L — ABNORMAL LOW (ref 22–32)
pCO2 arterial: 30.1 mmHg — ABNORMAL LOW (ref 32–48)
pCO2 arterial: 58.3 mmHg — ABNORMAL HIGH (ref 32–48)
pCO2 arterial: 38.6 mmHg (ref 32–48)
pCO2 arterial: 38.2 mmHg (ref 32–48)
pCO2 arterial: 29.5 mmHg — ABNORMAL LOW (ref 32–48)
pCO2 arterial: 28.7 mmHg — ABNORMAL LOW (ref 32–48)
pH, Arterial: 7.426 (ref 7.35–7.45)
pH, Arterial: 7.212 — ABNORMAL LOW (ref 7.35–7.45)
pH, Arterial: 7.372 (ref 7.35–7.45)
pH, Arterial: 7.34 — ABNORMAL LOW (ref 7.35–7.45)
pH, Arterial: 7.415 (ref 7.35–7.45)
pH, Arterial: 7.371 (ref 7.35–7.45)
pO2, Arterial: 391 mmHg — ABNORMAL HIGH (ref 83–108)
pO2, Arterial: 482 mmHg — ABNORMAL HIGH (ref 83–108)
pO2, Arterial: 228 mmHg — ABNORMAL HIGH (ref 83–108)
pO2, Arterial: 151 mmHg — ABNORMAL HIGH (ref 83–108)
pO2, Arterial: 123 mmHg — ABNORMAL HIGH (ref 83–108)
pO2, Arterial: 121 mmHg — ABNORMAL HIGH (ref 83–108)

## 2023-05-18 LAB — POCT I-STAT, CHEM 8
BUN: 14 mg/dL (ref 8–23)
BUN: 12 mg/dL (ref 8–23)
BUN: 13 mg/dL (ref 8–23)
BUN: 15 mg/dL (ref 8–23)
BUN: 14 mg/dL (ref 8–23)
Calcium, Ion: 1.23 mmol/L (ref 1.15–1.40)
Calcium, Ion: 1.03 mmol/L — ABNORMAL LOW (ref 1.15–1.40)
Calcium, Ion: 1.2 mmol/L (ref 1.15–1.40)
Calcium, Ion: 1.03 mmol/L — ABNORMAL LOW (ref 1.15–1.40)
Calcium, Ion: 1.18 mmol/L (ref 1.15–1.40)
Chloride: 103 mmol/L (ref 98–111)
Chloride: 101 mmol/L (ref 98–111)
Chloride: 104 mmol/L (ref 98–111)
Chloride: 102 mmol/L (ref 98–111)
Chloride: 105 mmol/L (ref 98–111)
Creatinine, Ser: 0.7 mg/dL (ref 0.61–1.24)
Creatinine, Ser: 0.7 mg/dL (ref 0.61–1.24)
Creatinine, Ser: 0.7 mg/dL (ref 0.61–1.24)
Creatinine, Ser: 0.7 mg/dL (ref 0.61–1.24)
Creatinine, Ser: 0.7 mg/dL (ref 0.61–1.24)
Glucose, Bld: 101 mg/dL — ABNORMAL HIGH (ref 70–99)
Glucose, Bld: 117 mg/dL — ABNORMAL HIGH (ref 70–99)
Glucose, Bld: 121 mg/dL — ABNORMAL HIGH (ref 70–99)
Glucose, Bld: 170 mg/dL — ABNORMAL HIGH (ref 70–99)
Glucose, Bld: 160 mg/dL — ABNORMAL HIGH (ref 70–99)
HCT: 37 % — ABNORMAL LOW (ref 39.0–52.0)
HCT: 28 % — ABNORMAL LOW (ref 39.0–52.0)
HCT: 32 % — ABNORMAL LOW (ref 39.0–52.0)
HCT: 27 % — ABNORMAL LOW (ref 39.0–52.0)
HCT: 31 % — ABNORMAL LOW (ref 39.0–52.0)
Hemoglobin: 12.6 g/dL — ABNORMAL LOW (ref 13.0–17.0)
Hemoglobin: 10.9 g/dL — ABNORMAL LOW (ref 13.0–17.0)
Hemoglobin: 9.5 g/dL — ABNORMAL LOW (ref 13.0–17.0)
Hemoglobin: 9.2 g/dL — ABNORMAL LOW (ref 13.0–17.0)
Hemoglobin: 10.5 g/dL — ABNORMAL LOW (ref 13.0–17.0)
Potassium: 3.9 mmol/L (ref 3.5–5.1)
Potassium: 3.9 mmol/L (ref 3.5–5.1)
Potassium: 4.2 mmol/L (ref 3.5–5.1)
Potassium: 5.6 mmol/L — ABNORMAL HIGH (ref 3.5–5.1)
Potassium: 5.1 mmol/L (ref 3.5–5.1)
Sodium: 139 mmol/L (ref 135–145)
Sodium: 136 mmol/L (ref 135–145)
Sodium: 136 mmol/L (ref 135–145)
Sodium: 134 mmol/L — ABNORMAL LOW (ref 135–145)
Sodium: 135 mmol/L (ref 135–145)
TCO2: 28 mmol/L (ref 22–32)
TCO2: 25 mmol/L (ref 22–32)
TCO2: 26 mmol/L (ref 22–32)
TCO2: 24 mmol/L (ref 22–32)
TCO2: 25 mmol/L (ref 22–32)

## 2023-05-18 LAB — BLOOD GAS, ARTERIAL
Acid-Base Excess: 1.7 mmol/L (ref 0.0–2.0)
Bicarbonate: 26.6 mmol/L (ref 20.0–28.0)
O2 Saturation: 100 %
Patient temperature: 37
pCO2 arterial: 42 mmHg (ref 32–48)
pH, Arterial: 7.41 (ref 7.35–7.45)
pO2, Arterial: 102 mmHg (ref 83–108)

## 2023-05-18 LAB — PREPARE CRYOPRECIPITATE
Unit division: 0
Unit division: 0

## 2023-05-18 LAB — CBC
HCT: 29.8 % — ABNORMAL LOW (ref 39.0–52.0)
HCT: 29.3 % — ABNORMAL LOW (ref 39.0–52.0)
Hemoglobin: 10 g/dL — ABNORMAL LOW (ref 13.0–17.0)
Hemoglobin: 9.6 g/dL — ABNORMAL LOW (ref 13.0–17.0)
MCH: 30.4 pg (ref 26.0–34.0)
MCH: 29.9 pg (ref 26.0–34.0)
MCHC: 33.6 g/dL (ref 30.0–36.0)
MCHC: 32.8 g/dL (ref 30.0–36.0)
MCV: 90.6 fL (ref 80.0–100.0)
MCV: 91.3 fL (ref 80.0–100.0)
Platelets: 102 10*3/uL — ABNORMAL LOW (ref 150–400)
Platelets: 112 10*3/uL — ABNORMAL LOW (ref 150–400)
RBC: 3.29 MIL/uL — ABNORMAL LOW (ref 4.22–5.81)
RBC: 3.21 MIL/uL — ABNORMAL LOW (ref 4.22–5.81)
RDW: 13.6 % (ref 11.5–15.5)
RDW: 13.5 % (ref 11.5–15.5)
WBC: 10.2 10*3/uL (ref 4.0–10.5)
WBC: 11.7 10*3/uL — ABNORMAL HIGH (ref 4.0–10.5)
nRBC: 0 % (ref 0.0–0.2)
nRBC: 0 % (ref 0.0–0.2)

## 2023-05-18 LAB — BASIC METABOLIC PANEL
Anion gap: 8 (ref 5–15)
BUN: 14 mg/dL (ref 8–23)
CO2: 21 mmol/L — ABNORMAL LOW (ref 22–32)
Calcium: 7.6 mg/dL — ABNORMAL LOW (ref 8.9–10.3)
Chloride: 108 mmol/L (ref 98–111)
Creatinine, Ser: 1.03 mg/dL (ref 0.61–1.24)
GFR, Estimated: >60 mL/min (ref >60)
Glucose, Bld: 131 mg/dL — ABNORMAL HIGH (ref 70–99)
Potassium: 4.3 mmol/L (ref 3.5–5.1)
Sodium: 137 mmol/L (ref 135–145)

## 2023-05-18 LAB — ECHO INTRAOPERATIVE TEE: Weight: 2720 oz

## 2023-05-18 LAB — GLUCOSE, CAPILLARY
Glucose-Capillary: 104 mg/dL — ABNORMAL HIGH (ref 70–99)
Glucose-Capillary: 122 mg/dL — ABNORMAL HIGH (ref 70–99)
Glucose-Capillary: 114 mg/dL — ABNORMAL HIGH (ref 70–99)
Glucose-Capillary: 134 mg/dL — ABNORMAL HIGH (ref 70–99)
Glucose-Capillary: 129 mg/dL — ABNORMAL HIGH (ref 70–99)
Glucose-Capillary: 162 mg/dL — ABNORMAL HIGH (ref 70–99)
Glucose-Capillary: 156 mg/dL — ABNORMAL HIGH (ref 70–99)
Glucose-Capillary: 136 mg/dL — ABNORMAL HIGH (ref 70–99)

## 2023-05-18 LAB — PROTIME-INR
INR: 1.6 — ABNORMAL HIGH (ref 0.8–1.2)
Prothrombin Time: 19.5 seconds — ABNORMAL HIGH (ref 11.4–15.2)

## 2023-05-18 LAB — POCT I-STAT EG7
Acid-base deficit: 1 mmol/L (ref 0.0–2.0)
Bicarbonate: 23.9 mmol/L (ref 20.0–28.0)
Calcium, Ion: 0.99 mmol/L — ABNORMAL LOW (ref 1.15–1.40)
HCT: 26 % — ABNORMAL LOW (ref 39.0–52.0)
Hemoglobin: 8.8 g/dL — ABNORMAL LOW (ref 13.0–17.0)
O2 Saturation: 83 %
Potassium: 4.5 mmol/L (ref 3.5–5.1)
Sodium: 140 mmol/L (ref 135–145)
TCO2: 25 mmol/L (ref 22–32)
pCO2, Ven: 40.1 mmHg — ABNORMAL LOW (ref 44–60)
pH, Ven: 7.384 (ref 7.25–7.43)
pO2, Ven: 48 mmHg — ABNORMAL HIGH (ref 32–45)

## 2023-05-18 LAB — HEMOGLOBIN AND HEMATOCRIT, BLOOD
HCT: 29.7 % — ABNORMAL LOW (ref 39.0–52.0)
Hemoglobin: 10 g/dL — ABNORMAL LOW (ref 13.0–17.0)

## 2023-05-18 LAB — MAGNESIUM: Magnesium: 3 mg/dL — ABNORMAL HIGH (ref 1.7–2.4)

## 2023-05-18 LAB — BPAM CRYOPRECIPITATE: Blood Product Expiration Date: 202407012359

## 2023-05-18 LAB — FIBRINOGEN: Fibrinogen: 216 mg/dL (ref 210–475)

## 2023-05-18 LAB — PLATELET COUNT: Platelets: 145 10*3/uL — ABNORMAL LOW (ref 150–400)

## 2023-05-18 LAB — APTT: aPTT: 37 seconds — ABNORMAL HIGH (ref 24–36)

## 2023-05-18 SURGERY — REPLACEMENT, AORTIC VALVE, OPEN
Anesthesia: General | Site: Chest

## 2023-05-18 MED ORDER — ALBUMIN HUMAN 5 % IV SOLN: INTRAVENOUS | Status: DC | PRN

## 2023-05-18 MED ORDER — MAGNESIUM SULFATE 4 GM/100ML IV SOLN
4.0000 g | Freq: Once | INTRAVENOUS | Status: AC
  Administered 2023-05-18: 4 g via INTRAVENOUS
  Filled 2023-05-18: qty 100

## 2023-05-18 MED ORDER — BACLOFEN 10 MG PO TABS
10.0000 mg | ORAL_TABLET | Freq: Every day | ORAL | Status: DC
  Administered 2023-05-19 – 2023-05-25 (×7): 10 mg via ORAL
  Filled 2023-05-18 (×7): qty 1

## 2023-05-18 MED ORDER — FENTANYL CITRATE (PF) 250 MCG/5ML IJ SOLN
INTRAMUSCULAR | Status: DC | PRN
  Administered 2023-05-18 (×2): 50 ug via INTRAVENOUS
  Administered 2023-05-18: 150 ug via INTRAVENOUS
  Administered 2023-05-18: 50 ug via INTRAVENOUS
  Administered 2023-05-18: 200 ug via INTRAVENOUS
  Administered 2023-05-18: 100 ug via INTRAVENOUS
  Administered 2023-05-18: 50 ug via INTRAVENOUS
  Administered 2023-05-18: 200 ug via INTRAVENOUS

## 2023-05-18 MED ORDER — SODIUM CHLORIDE 0.9% FLUSH
10.0000 mL | Freq: Two times a day (BID) | INTRAVENOUS | Status: DC
  Administered 2023-05-18 – 2023-05-19 (×4): 10 mL

## 2023-05-18 MED ORDER — ESMOLOL HCL 100 MG/10ML IV SOLN
INTRAVENOUS | Status: AC
  Filled 2023-05-18: qty 10

## 2023-05-18 MED ORDER — SODIUM CHLORIDE 0.9 % IV SOLN: INTRAVENOUS | Status: DC | PRN

## 2023-05-18 MED ORDER — BISACODYL 10 MG RE SUPP: 10.0000 mg | Freq: Every day | RECTAL | Status: DC

## 2023-05-18 MED ORDER — SODIUM CHLORIDE 0.9 % IV SOLN
250.0000 mL | INTRAVENOUS | Status: DC
  Administered 2023-05-19: 250 mL via INTRAVENOUS

## 2023-05-18 MED ORDER — ALBUMIN HUMAN 5 % IV SOLN
250.0000 mL | INTRAVENOUS | Status: DC | PRN
  Administered 2023-05-18 (×3): 12.5 g via INTRAVENOUS
  Filled 2023-05-18: qty 250

## 2023-05-18 MED ORDER — PROTAMINE SULFATE 10 MG/ML IV SOLN
INTRAVENOUS | Status: AC
  Filled 2023-05-18: qty 25

## 2023-05-18 MED ORDER — LACTATED RINGERS IV SOLN: INTRAVENOUS | Status: DC | PRN

## 2023-05-18 MED ORDER — PHENYLEPHRINE HCL-NACL 20-0.9 MG/250ML-% IV SOLN: 0.0000 ug/min | INTRAVENOUS | Status: DC

## 2023-05-18 MED ORDER — CHLORHEXIDINE GLUCONATE CLOTH 2 % EX PADS
6.0000 | MEDICATED_PAD | Freq: Every day | CUTANEOUS | Status: DC
  Administered 2023-05-19: 6 via TOPICAL

## 2023-05-18 MED ORDER — VANCOMYCIN HCL IN DEXTROSE 1-5 GM/200ML-% IV SOLN
1000.0000 mg | Freq: Once | INTRAVENOUS | Status: AC
  Administered 2023-05-18: 1000 mg via INTRAVENOUS
  Filled 2023-05-18: qty 200

## 2023-05-18 MED ORDER — ALBUTEROL SULFATE (2.5 MG/3ML) 0.083% IN NEBU
3.0000 mL | INHALATION_SOLUTION | Freq: Two times a day (BID) | RESPIRATORY_TRACT | Status: DC
  Administered 2023-05-19 (×2): 3 mL via RESPIRATORY_TRACT
  Filled 2023-05-18 (×2): qty 3

## 2023-05-18 MED ORDER — MIDAZOLAM HCL 2 MG/2ML IJ SOLN
2.0000 mg | INTRAMUSCULAR | Status: DC | PRN
  Filled 2023-05-18: qty 2

## 2023-05-18 MED ORDER — SODIUM CHLORIDE 0.9% FLUSH
3.0000 mL | Freq: Two times a day (BID) | INTRAVENOUS | Status: DC
  Administered 2023-05-19 (×2): 3 mL via INTRAVENOUS

## 2023-05-18 MED ORDER — LACTATED RINGERS IV SOLN: INTRAVENOUS | Status: DC

## 2023-05-18 MED ORDER — SODIUM CHLORIDE 0.9% FLUSH: 3.0000 mL | INTRAVENOUS | Status: DC | PRN

## 2023-05-18 MED ORDER — MIDAZOLAM HCL (PF) 5 MG/ML IJ SOLN
INTRAMUSCULAR | Status: DC | PRN
  Administered 2023-05-18: 4 mg via INTRAVENOUS
  Administered 2023-05-18: 2 mg via INTRAVENOUS
  Administered 2023-05-18: 1 mg via INTRAVENOUS
  Administered 2023-05-18: 3 mg via INTRAVENOUS

## 2023-05-18 MED ORDER — PHENYLEPHRINE 80 MCG/ML (10ML) SYRINGE FOR IV PUSH (FOR BLOOD PRESSURE SUPPORT)
PREFILLED_SYRINGE | INTRAVENOUS | Status: DC | PRN
  Administered 2023-05-18 (×2): 80 ug via INTRAVENOUS
  Administered 2023-05-18: 20 ug via INTRAVENOUS

## 2023-05-18 MED ORDER — FENTANYL CITRATE (PF) 250 MCG/5ML IJ SOLN
INTRAMUSCULAR | Status: AC
  Filled 2023-05-18: qty 5

## 2023-05-18 MED ORDER — HEPARIN SODIUM (PORCINE) 1000 UNIT/ML IJ SOLN
INTRAMUSCULAR | Status: DC | PRN
  Administered 2023-05-18: 28000 [IU] via INTRAVENOUS

## 2023-05-18 MED ORDER — BISACODYL 5 MG PO TBEC
10.0000 mg | DELAYED_RELEASE_TABLET | Freq: Every day | ORAL | Status: DC
  Administered 2023-05-19 – 2023-05-21 (×3): 10 mg via ORAL
  Filled 2023-05-18 (×3): qty 2

## 2023-05-18 MED ORDER — METOCLOPRAMIDE HCL 5 MG/ML IJ SOLN
10.0000 mg | Freq: Four times a day (QID) | INTRAMUSCULAR | Status: AC
  Administered 2023-05-18 – 2023-05-19 (×6): 10 mg via INTRAVENOUS
  Filled 2023-05-18 (×6): qty 2

## 2023-05-18 MED ORDER — INSULIN REGULAR(HUMAN) IN NACL 100-0.9 UT/100ML-% IV SOLN: INTRAVENOUS | Status: DC

## 2023-05-18 MED ORDER — METOPROLOL TARTRATE 5 MG/5ML IV SOLN
2.5000 mg | INTRAVENOUS | Status: DC | PRN
  Administered 2023-05-21: 2.5 mg via INTRAVENOUS
  Filled 2023-05-18: qty 5

## 2023-05-18 MED ORDER — ROCURONIUM BROMIDE 10 MG/ML (PF) SYRINGE
PREFILLED_SYRINGE | INTRAVENOUS | Status: DC | PRN
  Administered 2023-05-18: 80 mg via INTRAVENOUS
  Administered 2023-05-18: 20 mg via INTRAVENOUS
  Administered 2023-05-18 (×3): 50 mg via INTRAVENOUS

## 2023-05-18 MED ORDER — PROPOFOL 10 MG/ML IV BOLUS
INTRAVENOUS | Status: AC
  Filled 2023-05-18: qty 20

## 2023-05-18 MED ORDER — ACETAMINOPHEN 160 MG/5ML PO SOLN: 1000.0000 mg | Freq: Four times a day (QID) | ORAL | Status: AC

## 2023-05-18 MED ORDER — MIDAZOLAM HCL (PF) 10 MG/2ML IJ SOLN
INTRAMUSCULAR | Status: AC
  Filled 2023-05-18: qty 2

## 2023-05-18 MED ORDER — METOPROLOL TARTRATE 12.5 MG HALF TABLET
12.5000 mg | ORAL_TABLET | Freq: Two times a day (BID) | ORAL | Status: DC
  Administered 2023-05-19 – 2023-05-25 (×11): 12.5 mg via ORAL
  Filled 2023-05-18 (×12): qty 1

## 2023-05-18 MED ORDER — CHLORHEXIDINE GLUCONATE 0.12 % MT SOLN
15.0000 mL | Freq: Once | OROMUCOSAL | Status: AC
  Administered 2023-05-18: 15 mL via OROMUCOSAL
  Filled 2023-05-18: qty 15

## 2023-05-18 MED ORDER — POTASSIUM CHLORIDE 10 MEQ/50ML IV SOLN
10.0000 meq | INTRAVENOUS | Status: AC
  Administered 2023-05-18 (×3): 10 meq via INTRAVENOUS

## 2023-05-18 MED ORDER — CEFAZOLIN SODIUM-DEXTROSE 2-4 GM/100ML-% IV SOLN
2.0000 g | Freq: Three times a day (TID) | INTRAVENOUS | Status: AC
  Administered 2023-05-18 – 2023-05-20 (×6): 2 g via INTRAVENOUS
  Filled 2023-05-18 (×6): qty 100

## 2023-05-18 MED ORDER — ASPIRIN 81 MG PO CHEW: 324.0000 mg | CHEWABLE_TABLET | Freq: Every day | ORAL | Status: DC

## 2023-05-18 MED ORDER — PLASMA-LYTE A IV SOLN
INTRAVENOUS | Status: DC | PRN
  Administered 2023-05-18: 500 mL via INTRAVASCULAR

## 2023-05-18 MED ORDER — THROMBIN (RECOMBINANT) 20000 UNITS EX SOLR
CUTANEOUS | Status: AC
  Filled 2023-05-18: qty 20000

## 2023-05-18 MED ORDER — ACETAMINOPHEN 500 MG PO TABS
1000.0000 mg | ORAL_TABLET | Freq: Four times a day (QID) | ORAL | Status: AC
  Administered 2023-05-18 – 2023-05-22 (×14): 1000 mg via ORAL
  Filled 2023-05-18 (×15): qty 2

## 2023-05-18 MED ORDER — METOPROLOL TARTRATE 25 MG/10 ML ORAL SUSPENSION
12.5000 mg | Freq: Two times a day (BID) | ORAL | Status: DC
  Administered 2023-05-20: 12.5 mg
  Filled 2023-05-18 (×8): qty 5
  Filled 2023-05-18: qty 10
  Filled 2023-05-18 (×2): qty 5

## 2023-05-18 MED ORDER — ATORVASTATIN CALCIUM 80 MG PO TABS
80.0000 mg | ORAL_TABLET | Freq: Every day | ORAL | Status: DC
  Administered 2023-05-19 – 2023-05-25 (×7): 80 mg via ORAL
  Filled 2023-05-18 (×7): qty 1

## 2023-05-18 MED ORDER — SODIUM CHLORIDE 0.9 % IV SOLN: INTRAVENOUS | Status: DC

## 2023-05-18 MED ORDER — CHLORHEXIDINE GLUCONATE 0.12 % MT SOLN
15.0000 mL | OROMUCOSAL | Status: AC
  Administered 2023-05-18: 15 mL via OROMUCOSAL
  Filled 2023-05-18: qty 15

## 2023-05-18 MED ORDER — ROCURONIUM BROMIDE 10 MG/ML (PF) SYRINGE
PREFILLED_SYRINGE | INTRAVENOUS | Status: AC
  Filled 2023-05-18: qty 20

## 2023-05-18 MED ORDER — SODIUM CHLORIDE 0.9% FLUSH: 10.0000 mL | INTRAVENOUS | Status: DC | PRN

## 2023-05-18 MED ORDER — DEXTROSE 50 % IV SOLN: 0.0000 mL | INTRAVENOUS | Status: DC | PRN

## 2023-05-18 MED ORDER — MORPHINE SULFATE (PF) 2 MG/ML IV SOLN
1.0000 mg | INTRAVENOUS | Status: DC | PRN
  Administered 2023-05-18 – 2023-05-19 (×4): 2 mg via INTRAVENOUS
  Filled 2023-05-18 (×4): qty 1

## 2023-05-18 MED ORDER — DEXMEDETOMIDINE HCL IN NACL 400 MCG/100ML IV SOLN: 0.0000 ug/kg/h | INTRAVENOUS | Status: DC

## 2023-05-18 MED ORDER — FINASTERIDE 5 MG PO TABS
5.0000 mg | ORAL_TABLET | Freq: Every day | ORAL | Status: DC
  Administered 2023-05-19 – 2023-05-25 (×7): 5 mg via ORAL
  Filled 2023-05-18 (×7): qty 1

## 2023-05-18 MED ORDER — HEMOSTATIC AGENTS (NO CHARGE) OPTIME
TOPICAL | Status: DC | PRN
  Administered 2023-05-18 (×7): 1 via TOPICAL

## 2023-05-18 MED ORDER — 0.9 % SODIUM CHLORIDE (POUR BTL) OPTIME
TOPICAL | Status: DC | PRN
  Administered 2023-05-18: 5000 mL

## 2023-05-18 MED ORDER — HEPARIN SODIUM (PORCINE) 1000 UNIT/ML IJ SOLN
INTRAMUSCULAR | Status: AC
  Filled 2023-05-18: qty 1

## 2023-05-18 MED ORDER — METOPROLOL TARTRATE 12.5 MG HALF TABLET
12.5000 mg | ORAL_TABLET | Freq: Once | ORAL | Status: DC
  Filled 2023-05-18: qty 1

## 2023-05-18 MED ORDER — ~~LOC~~ CARDIAC SURGERY, PATIENT & FAMILY EDUCATION
Freq: Once | Status: DC
  Filled 2023-05-18: qty 1

## 2023-05-18 MED ORDER — PANTOPRAZOLE SODIUM 40 MG PO TBEC
40.0000 mg | DELAYED_RELEASE_TABLET | Freq: Every day | ORAL | Status: DC
  Administered 2023-05-20 – 2023-05-25 (×6): 40 mg via ORAL
  Filled 2023-05-18 (×6): qty 1

## 2023-05-18 MED ORDER — CHLORHEXIDINE GLUCONATE 4 % EX SOLN: 30.0000 mL | CUTANEOUS | Status: DC

## 2023-05-18 MED ORDER — CHLORHEXIDINE GLUCONATE CLOTH 2 % EX PADS
6.0000 | MEDICATED_PAD | Freq: Every day | CUTANEOUS | Status: DC
  Administered 2023-05-18 – 2023-05-19 (×2): 6 via TOPICAL

## 2023-05-18 MED ORDER — ACETAMINOPHEN 160 MG/5ML PO SOLN
650.0000 mg | Freq: Once | ORAL | Status: AC
  Administered 2023-05-18: 650 mg
  Filled 2023-05-18: qty 20.3

## 2023-05-18 MED ORDER — PANTOPRAZOLE SODIUM 40 MG IV SOLR
40.0000 mg | Freq: Every day | INTRAVENOUS | Status: AC
  Administered 2023-05-18 – 2023-05-19 (×2): 40 mg via INTRAVENOUS
  Filled 2023-05-18 (×2): qty 10

## 2023-05-18 MED ORDER — ASPIRIN 81 MG PO CHEW
324.0000 mg | CHEWABLE_TABLET | Freq: Once | ORAL | Status: AC
  Administered 2023-05-18: 324 mg via ORAL
  Filled 2023-05-18: qty 4

## 2023-05-18 MED ORDER — PROPOFOL 10 MG/ML IV BOLUS
INTRAVENOUS | Status: DC | PRN
  Administered 2023-05-18: 100 mg via INTRAVENOUS
  Administered 2023-05-18: 40 mg via INTRAVENOUS
  Administered 2023-05-18 (×2): 20 mg via INTRAVENOUS

## 2023-05-18 MED ORDER — DOCUSATE SODIUM 100 MG PO CAPS
200.0000 mg | ORAL_CAPSULE | Freq: Every day | ORAL | Status: DC
  Administered 2023-05-19: 200 mg via ORAL
  Filled 2023-05-18: qty 2

## 2023-05-18 MED ORDER — OXYCODONE HCL 5 MG PO TABS
5.0000 mg | ORAL_TABLET | ORAL | Status: DC | PRN
  Administered 2023-05-19: 5 mg via ORAL
  Filled 2023-05-18: qty 1

## 2023-05-18 MED ORDER — METHYLPREDNISOLONE SODIUM SUCC 125 MG IJ SOLR
INTRAMUSCULAR | Status: DC | PRN
  Administered 2023-05-18: 125 mg via INTRAVENOUS

## 2023-05-18 MED ORDER — SODIUM CHLORIDE 0.45 % IV SOLN: INTRAVENOUS | Status: DC | PRN

## 2023-05-18 MED ORDER — PHENYLEPHRINE HCL-NACL 20-0.9 MG/250ML-% IV SOLN
INTRAVENOUS | Status: DC | PRN
  Administered 2023-05-18: 50 ug/min via INTRAVENOUS

## 2023-05-18 MED ORDER — ONDANSETRON HCL 4 MG/2ML IJ SOLN
4.0000 mg | Freq: Four times a day (QID) | INTRAMUSCULAR | Status: DC | PRN
  Administered 2023-05-19: 4 mg via INTRAVENOUS
  Filled 2023-05-18: qty 2

## 2023-05-18 MED ORDER — PROTAMINE SULFATE 10 MG/ML IV SOLN
INTRAVENOUS | Status: DC | PRN
  Administered 2023-05-18 (×6): 25 mg via INTRAVENOUS
  Administered 2023-05-18: 30 mg via INTRAVENOUS
  Administered 2023-05-18: 25 mg via INTRAVENOUS
  Administered 2023-05-18: 30 mg via INTRAVENOUS
  Administered 2023-05-18: 20 mg via INTRAVENOUS
  Administered 2023-05-18: 25 mg via INTRAVENOUS

## 2023-05-18 MED ORDER — NITROGLYCERIN IN D5W 200-5 MCG/ML-% IV SOLN: 0.0000 ug/min | INTRAVENOUS | Status: DC

## 2023-05-18 MED ORDER — ASPIRIN 325 MG PO TBEC: 325.0000 mg | DELAYED_RELEASE_TABLET | Freq: Every day | ORAL | Status: DC

## 2023-05-18 MED ORDER — SODIUM CHLORIDE (PF) 0.9 % IJ SOLN
OROMUCOSAL | Status: DC | PRN
  Administered 2023-05-18 (×3): 4 mL via TOPICAL

## 2023-05-18 MED ORDER — TRAMADOL HCL 50 MG PO TABS
50.0000 mg | ORAL_TABLET | ORAL | Status: DC | PRN
  Administered 2023-05-19: 100 mg via ORAL
  Administered 2023-05-19 (×2): 50 mg via ORAL
  Administered 2023-05-20: 100 mg via ORAL
  Administered 2023-05-21: 50 mg via ORAL
  Filled 2023-05-18: qty 2
  Filled 2023-05-18: qty 1
  Filled 2023-05-18: qty 2
  Filled 2023-05-18 (×2): qty 1

## 2023-05-18 MED ORDER — CALCIUM CHLORIDE 10 % IV SOLN
INTRAVENOUS | Status: DC | PRN
  Administered 2023-05-18 (×3): 100 mg via INTRAVENOUS

## 2023-05-18 SURGICAL SUPPLY — 96 items
ADAPTER CARDIO PERF ANTE/RETRO (ADAPTER) ×2 IMPLANT
ADPR PRFSN 84XANTGRD RTRGD (ADAPTER) ×4
BAG DECANTER FOR FLEXI CONT (MISCELLANEOUS) ×2 IMPLANT
BLADE CLIPPER SURG (BLADE) ×2 IMPLANT
BLADE STERNUM SYSTEM 6 (BLADE) ×2 IMPLANT
BLADE SURG 11 STRL SS (BLADE) IMPLANT
BLADE SURG 15 STRL LF DISP TIS (BLADE) ×2 IMPLANT
BLADE SURG 15 STRL SS (BLADE) ×2
CANISTER SUCT 3000ML PPV (MISCELLANEOUS) ×2 IMPLANT
CANNULA AORTIC ROOT 9FR (CANNULA) IMPLANT
CANNULA ARTERIAL VENT 3/8 20FR (CANNULA) IMPLANT
CANNULA GUNDRY RCSP 15FR (MISCELLANEOUS) ×2 IMPLANT
CANNULA GUNDRY RETROGRADE 15FR (MISCELLANEOUS) IMPLANT
CANNULA MC2 2 STG 36/46 NON-V (CANNULA) IMPLANT
CANNULA SUMP PERICARDIAL (CANNULA) IMPLANT
CATH HEART VENT LEFT (CATHETERS) ×2 IMPLANT
CATH ROBINSON RED A/P 18FR (CATHETERS) ×6 IMPLANT
CATH THORACIC 36FR (CATHETERS) ×2 IMPLANT
CATH THORACIC 36FR RT ANG (CATHETERS) ×2 IMPLANT
CNTNR URN SCR LID CUP LEK RST (MISCELLANEOUS) ×2 IMPLANT
CONT SPEC 4OZ STRL OR WHT (MISCELLANEOUS) ×6
CONTAINER PROTECT SURGISLUSH (MISCELLANEOUS) ×4 IMPLANT
COVER SURGICAL LIGHT HANDLE (MISCELLANEOUS) ×2 IMPLANT
DEVICE SUT CK QUICK LOAD MINI (Prosthesis & Implant Heart) IMPLANT
DRAPE CARDIOVASCULAR INCISE (DRAPES) ×2
DRAPE SRG 135X102X78XABS (DRAPES) ×2 IMPLANT
DRAPE WARM FLUID 44X44 (DRAPES) ×2 IMPLANT
DRSG COVADERM 4X14 (GAUZE/BANDAGES/DRESSINGS) ×2 IMPLANT
ELECT CAUTERY BLADE 6.4 (BLADE) ×2 IMPLANT
ELECT REM PT RETURN 9FT ADLT (ELECTROSURGICAL) ×4
ELECTRODE REM PT RTRN 9FT ADLT (ELECTROSURGICAL) ×4 IMPLANT
FELT TEFLON 1X6 (MISCELLANEOUS) ×4 IMPLANT
GAUZE 4X4 16PLY ~~LOC~~+RFID DBL (SPONGE) ×2 IMPLANT
GAUZE SPONGE 4X4 12PLY STRL (GAUZE/BANDAGES/DRESSINGS) ×2 IMPLANT
GLOVE BIO SURGEON STRL SZ 6 (GLOVE) IMPLANT
GLOVE BIO SURGEON STRL SZ 6.5 (GLOVE) IMPLANT
GLOVE BIO SURGEON STRL SZ7 (GLOVE) IMPLANT
GLOVE BIO SURGEON STRL SZ7.5 (GLOVE) IMPLANT
GLOVE BIOGEL PI IND STRL 6.5 (GLOVE) IMPLANT
GLOVE BIOGEL PI IND STRL 7.5 (GLOVE) IMPLANT
GLOVE ECLIPSE 7.0 STRL STRAW (GLOVE) IMPLANT
GLOVE SS BIOGEL STRL SZ 6 (GLOVE) IMPLANT
GLOVE SURG MICRO LTX SZ7 (GLOVE) ×4 IMPLANT
GLOVE SURG SS PI 7.0 STRL IVOR (GLOVE) IMPLANT
GOWN STRL REUS W/ TWL LRG LVL3 (GOWN DISPOSABLE) ×8 IMPLANT
GOWN STRL REUS W/ TWL XL LVL3 (GOWN DISPOSABLE) ×2 IMPLANT
GOWN STRL REUS W/TWL LRG LVL3 (GOWN DISPOSABLE) ×8
GOWN STRL REUS W/TWL XL LVL3 (GOWN DISPOSABLE) ×8
GRAFT HEMASHIELD 30X10 (Vascular Products) IMPLANT
HEMOSTAT POWDER SURGIFOAM 1G (HEMOSTASIS) ×6 IMPLANT
HEMOSTAT SURGICEL 2X14 (HEMOSTASIS) ×2 IMPLANT
KIT BASIN OR (CUSTOM PROCEDURE TRAY) ×2 IMPLANT
KIT CATH CPB BARTLE (MISCELLANEOUS) ×2 IMPLANT
KIT SUCTION CATH 14FR (SUCTIONS) ×2 IMPLANT
KIT SUT CK MINI COMBO 4X17 (Prosthesis & Implant Heart) IMPLANT
KIT TURNOVER KIT B (KITS) ×2 IMPLANT
LINE VENT (MISCELLANEOUS) IMPLANT
NS IRRIG 1000ML POUR BTL (IV SOLUTION) ×12 IMPLANT
PACK E OPEN HEART (SUTURE) ×2 IMPLANT
PACK OPEN HEART (CUSTOM PROCEDURE TRAY) ×2 IMPLANT
PAD ARMBOARD 7.5X6 YLW CONV (MISCELLANEOUS) ×4 IMPLANT
POSITIONER HEAD DONUT 9IN (MISCELLANEOUS) ×2 IMPLANT
SEALANT SURG COSEAL 8ML (VASCULAR PRODUCTS) IMPLANT
SET MPS 3-ND DEL (MISCELLANEOUS) IMPLANT
SET VEIN GRAFT PERF (SET/KITS/TRAYS/PACK) IMPLANT
SPONGE T-LAP 18X18 ~~LOC~~+RFID (SPONGE) ×8 IMPLANT
SPONGE T-LAP 4X18 ~~LOC~~+RFID (SPONGE) ×2 IMPLANT
SUT BONE WAX W31G (SUTURE) ×2 IMPLANT
SUT EB EXC GRN/WHT 2-0 V-5 (SUTURE) ×4 IMPLANT
SUT ETHIBON EXCEL 2-0 V-5 (SUTURE) IMPLANT
SUT ETHIBOND V-5 VALVE (SUTURE) IMPLANT
SUT PROLENE 3 0 SH 48 (SUTURE) IMPLANT
SUT PROLENE 3 0 SH DA (SUTURE) IMPLANT
SUT PROLENE 3 0 SH1 36 (SUTURE) ×2 IMPLANT
SUT PROLENE 4 0 RB 1 (SUTURE) ×20
SUT PROLENE 4-0 RB1 .5 CRCL 36 (SUTURE) ×6 IMPLANT
SUT PROLENE 5 0 C 1 36 (SUTURE) IMPLANT
SUT PROLENE 7 0 BV 1 (SUTURE) IMPLANT
SUT SILK 2 0 SH CR/8 (SUTURE) IMPLANT
SUT STEEL 6MS V (SUTURE) IMPLANT
SUT STEEL STERNAL CCS#1 18IN (SUTURE) IMPLANT
SUT STEEL SZ 6 DBL 3X14 BALL (SUTURE) IMPLANT
SUT TEM PAC WIRE 2 0 SH (SUTURE) IMPLANT
SUT VIC AB 1 CTX 36 (SUTURE) ×4
SUT VIC AB 1 CTX36XBRD ANBCTR (SUTURE) ×4 IMPLANT
SYSTEM SAHARA CHEST DRAIN ATS (WOUND CARE) ×2 IMPLANT
TAPE CLOTH SURG 4X10 WHT LF (GAUZE/BANDAGES/DRESSINGS) IMPLANT
TAPE PAPER 2X10 WHT MICROPORE (GAUZE/BANDAGES/DRESSINGS) IMPLANT
TOWEL GREEN STERILE (TOWEL DISPOSABLE) ×2 IMPLANT
TOWEL GREEN STERILE FF (TOWEL DISPOSABLE) ×2 IMPLANT
TUBE CONNECTING 12X1/4 (SUCTIONS) IMPLANT
UNDERPAD 30X36 HEAVY ABSORB (UNDERPADS AND DIAPERS) ×2 IMPLANT
VALVE AORTIC SZ23 INSP/RESIL (Prosthesis & Implant Heart) IMPLANT
VENT LEFT HEART 12002 (CATHETERS) ×4
WATER STERILE IRR 1000ML POUR (IV SOLUTION) ×4 IMPLANT
YANKAUER SUCT BULB TIP NO VENT (SUCTIONS) IMPLANT

## 2023-05-18 NOTE — Discharge Instructions (Addendum)

## 2023-05-18 NOTE — Op Note (Signed)
CARDIOVASCULAR SURGERY OPERATIVE NOTE  05/18/2023  Surgeon:  Alleen Borne, MD  First Assistant: Aloha Gell,  PA-C : An experienced assistant was required given the complexity of this surgery and the standard of surgical care. The assistant was needed for exposure, dissection, suctioning, retraction of delicate tissues and sutures, instrument exchange and for overall help during this procedure.    Preoperative Diagnosis:  Bicuspid aortic valve stenosis with 5.1 cm ascending aortic aneurysm   Postoperative Diagnosis:  Same   Procedure:  Median Sternotomy Extracorporeal circulation 3.   Replacement of ascending aortic aneurysm (Hemi-arch) under deep hypothermic circulatory arrest, retrograde cerebral perfusion. 4.   Aortic valve replacement using a 23 mm INSPIRIS RESILIA pericardial valve.  Anesthesia:  General Endotracheal   Clinical History/Surgical Indication:  This 67 year old gentleman has stage D, severe, symptomatic aortic stenosis with NYHA class II symptoms of exertional fatigue and shortness of breath consistent with chronic diastolic congestive heart failure.  He has also been having some episodes of dizziness and syncope.  His echocardiogram shows a heavily calcified aortic valve with restricted leaflet mobility.  The mean gradient was 43 mmHg consistent with severe aortic stenosis.  There is moderate aortic insufficiency.  Left ventricular ejection fraction is normal.  Cardiac catheterization shows a known chronic total occlusion of the RCA with right to right collaterals filling the distal vessel.  There is otherwise no significant disease.  Gated cardiac CTA shows a bicuspid aortic valve with a 5.1 cm fusiform ascending aortic aneurysm.  The aortic root appears to be fairly normal size with the aneurysm occurring above the sinotubular junction.  I agree that aortic valve replacement is indicated in this patient for relief of his symptoms. Given his relatively young age and  significant ascending aortic aneurysm I think that open surgical AVR and replacement of the ascending aorta is the best treatment for him.  I reviewed the echo, cath, and CT images with him and answered all of his questions.  I recommend a bioprosthetic valve given his age and comorbidities.  I discussed the operative procedure with the patient  including alternatives, benefits and risks; including but not limited to bleeding, blood transfusion, infection, stroke, myocardial infarction, graft failure, heart block requiring a permanent pacemaker, organ dysfunction, and death.  [name] understands and agrees to proceed.    Preparation:  The patient was seen in the preoperative holding area and the correct patient, correct operation were confirmed with the patient after reviewing the medical record and catheterization. The consent was signed by me. Preoperative antibiotics were given. A pulmonary arterial line and radial arterial line were placed by the anesthesia team. The patient was taken back to the operating room and positioned supine on the operating room table. After being placed under general endotracheal anesthesia by the anesthesia team a foley catheter was placed. The neck, chest, abdomen, and both legs were prepped with betadine soap and solution and draped in the usual sterile manner. A surgical time-out was taken and the correct patient and operative procedure were confirmed with the nursing and anesthesia staff.   Cardiopulmonary Bypass:  A median sternotomy was performed. The pericardium was opened in the midline. Right ventricular function appeared normal. The ascending aorta was aneurysmal and had no palpable plaque. There were no contraindications to aortic cannulation. The patient was fully systemically heparinized and the ACT was maintained > 400 sec. The mid ascending aorta was cannulated with a 20 F aortic cannula for arterial inflow. Venous cannulation was performed via  the  right atrial appendage using a two-staged venous cannula.   A temperature probe was inserted into the interventricular septum and an insulating pad was placed in the pericardium. CO2 was insufflated into the pericardium throughout the case to minimize intracardiac air.   Resection and grafting of ascending aortic aneurysm:  The patient was placed on cardiopulmonary bypass and a left ventricular vent was placed via the right superior pulmonary vein. Systemic cooling was begun with a goal temperature of 22 degrees centigrade by bladder  temperature probe. A retrograde cardioplegia cannula was placed through the right atrium into the coronary sinus without difficulty. A retrograde  cannula was placed into the SVC through a pursestring suture and the SVC was encircled with a silastic tape. After 20 minutes of cooling the target temperature of 22 degrees centigrade was reached. Cerebral oximetry was 70% bilaterally. BIS was zero. The patient was given Propofol and 125 mg of Solumedrol. The head was packed in ice. The bed was placed in steep trendelenburg. Circulatory arrest was begun and the blood volume emptied into the venous reservoir.   Cold retrograde KBC cardioplegia was given and myocardial temperature dropped to 10 degrees centigrade. Additional doses were given at approximately 60 minute intervals throughout the period of circulatory arrest and cross-clamping. Complete diastolic arrest was maintained. After completion of the initial cardioplegia dose the SVC was encircled and the retrograde cerebral perfusion begun via the SVC. The aortic cannula was removed. The aorta was transected just proximal to the innominate artery beveling the resection out along the undersurface of the aortic arch (Hemiarch replacement). The aortic diameter was measured at 30 mm here. A 30 x 10 mm Hemasheild Platinum vascular graft was prepared. ( REF # [id number], Lot # [id number], SN [id number]). It was anastomosed to the aortic  arch in an end to end manner using 3-0 prolene continuous suture with a felt strip to reinforce the anastomisis. A light coating of CoSeal was applied to seal needle holes. The arterial end of the bypass circuit was then connected to the 10mm side arm graft and circulation was slowly resumed. The tape was removed from the SVC. The aortic graft was cross-clamped proximal to the side arm graft and full CPB support was resumed. Circulatory arrest time was 21. Retrograde cerebral perfusion time was 9 minutes.  Aortic Valve Replacement:   A transverse aortotomy was performed 1 cm above the take-off of the right coronary artery. The native valve was Type 1 bicuspid with fusion of the left and right cusps with calcified leaflets and mild annular calcification. The ostia of the coronary arteries were in normal position and were not obstructed. The native valve leaflets were excised and the annulus was decalcified with rongeurs. Care was taken to remove all particulate debris. The left ventricle was directly inspected for debris and then irrigated with ice saline solution. The annulus was sized and a size 23 mm Edwards INSPIRIS RESILIA  pericardial valve was chosen. The model number was [id number] and the serial number was [id number]. While the valve was being prepared 2-0 Ethibond pledgeted horizontal mattress sutures were placed around the annulus with the pledgets in a sub-annular position. The sutures were placed through the sewing ring and the valve lowered into place. The sutures were tied using CorKnots. The valve seated nicely and the coronary ostia were not obstructed. The prosthetic valve leaflets moved normally and there was no sub-valvular obstruction.    The aortic graft was then cut to the appropriate length and anastomosed end  to end to the supra-coronary aorta using continuous 3-0 prolene suture with a felt strip for reinforcement. CoSeal was applied to seal the needle holes in the grafts. A vent cannula was  placed into the graft to remove any air. Deairing maneuvers were performed and the bed placed in trendelenburg position.  The entire operation was performed by me and assisted by Aloha Gell, PA-C throughout.    Completion:  A dose of warm retrograde reanimation cardioplegia was given. The crossclamp was removed with a time of 83 minutes. There was spontaneous return of sinus rhythm. The position of the graft was satisfactory. The vascular anastomoses all appeared hemostatic. Two temporary epicardial pacing wires were placed on the right atrium and two on the right ventricle. The patient was weaned from CPB without difficulty on no inotropes. CPB time was 146 minutes. Cardiac output was 5 LPM. TEE showed normal LV systolic function. The prosthetic aortic valve was functioning normally with no paravalvular leak. Heparin was fully reversed with protamine and the venous cannula removed. The aortic side arm graft was ligated with a heavy silk tie followed by a 3-0 Prolene pledgetted horizontal mattress suture. Hemostasis was achieved. Mediastinal drainage tubes were placed. The sternum was closed with double #6 stainless steel wires. The fascia was closed with continuous # 1 vicryl suture. The subcutaneous tissue was closed with 2-0 vicryl continuous suture. The skin was closed with 3-0 vicryl subcuticular suture. All sponge, needle, and instrument counts were reported correct at the end of the case. Dry sterile dressings were placed over the incisions and around the chest tubes which were connected to pleurevac suction. The patient was then transported to the surgical intensive care unit in stable condition.

## 2023-05-18 NOTE — Hospital Course (Addendum)
HPI:   The patient is a 67 year old gentleman with a history of hyperlipidemia, coronary artery disease status post NSTEMI in 2015 with catheterization showing an occluded proximal RCA with right to right collaterals filling the distal vessel that could not be opened, renal cell carcinoma status post partial right nephrectomy in 2013, severe degenerative spine disease, and newly diagnosed severe aortic stenosis and ascending aortic aneurysm who was referred for consideration of surgical treatment.  He reports a several year history of low back pain and leg numbness, pain and weakness that he has seen multiple physicians for.  He had previous lower back surgery in the 1980s and a cervical disc surgery in 2022.  He reports generalized weakness and fatigue as well as some shortness of breath with activity.  He denies any chest pain or pressure.  He has had some dizziness and reports having blacked out a few times.  He has a lot of difficulty bending over due to profound weakness.  He had a 2D echocardiogram performed on 11/14/2022 due to a heart murmur which he said he had no prior knowledge of.  It showed an indeterminate number of cusps.  There is severe aortic stenosis with a mean gradient of 42 mmHg and a peak gradient of 66 mmHg.  Valve area by VTI was 0.74 cm with a dimensionless index of 0.26.  There is moderate aortic insufficiency.  Left ventricular ejection fraction was 55 to 60% with mild LVH and grade 1 diastolic dysfunction.  Cardiac catheterization on 04/03/2023 showed a known chronic total occlusion of the RCA with right to right collaterals filling the distal vessel.  There is nonobstructive plaquing in the left main, LAD, and left circumflex without obstruction.  Right heart pressures were normal.  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  Aortic valve calcium score was 2289.  There is a 5.1 x 5.0 cm fusiform ascending aortic aneurysm.    He lives with his wife who he takes care of.  She is a third time cancer survivor.  Dr. Laneta Simmers reviewed the patient's diagnostic studies and determined he would benefit from surgical intervention. He reviewed the treatment options as well as the risks and benefits of surgery with the patient. Mr. Harnisch was agreeable to proceed with surgery.   Hospital Course: Mr. Lowery arrived at [facility] and was brought to the operating room on 05/18/23. He underwent aortic valve replacement utilizing a 23mm Inspiris Resilia bioprosthetic aortic valve and ascending aortic aneurysm replacement utilizing a 43mmx10mm Hemashield Platinum Graft under circulatory arrest. He tolerated the procedure well and was transferred to the SICU in stable condition.  Postoperative hospital course:  The patient was extubated using standard post cardiac surgical protocols without difficulty.  He has remained hemodynamically stable.  He does have expected postoperative volume overload but is responding well to diuretics.  His Foley was kept in place until postop day 2 due to hematuria associated with traumatic catheterization in the setting of known BPH.  Chest tubes were removed on postoperative day #2 as well as were epicardial pacing wires.  He does have an expected acute blood loss anemia which is being monitored.  He has been started on iron supplementation.  He has maintained sinus rhythm with occasional PVCs.  He does have a postoperative thrombocytopenia which is being monitored with labs as well as clinically.  Aspirin was decreased to 81 mg due to this. He had a mild  reactive leukocytosis which is trending improved over time. He was noted to have right hand weakness at 23:30 on 07/03, rapid response was called. He then converted to atrial fibrillation with RVR and became hypotensive. He was given an Amiodarone bolus and drip and a NS bolus and cardiology was consulted. Cardiology recommended transitioning to Amiodarone  200mg  BID the following morning and a 30 day monitor at discharge. He converted to NSR the morning of 07/04. He was noted to have some weakness on his right side. CT head was ordered and showed no evidence of acute infarct or hemorrhage, brain MRI was ordered. 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 Neurology was consulted.  Exam at the time of their evaluation was notable for right hand weakness and mild right leg weakness.  He was not a candidate for thrombolysis or thrombectomy due to recent surgery.  Etiology was felt to be likely embolic in the setting of recent surgery and postoperative atrial fibrillation.  He was felt to be a candidate for Coumadin due to the recent new valve as well as postoperative atrial fibrillation and this has been initiated.  PT/OT/SLP consultations were obtained.  CT angio of the head and neck has also been ordered.

## 2023-05-18 NOTE — Anesthesia Procedure Notes (Signed)
Central Venous Catheter Insertion Performed by: Val Eagle, MD, anesthesiologist Start/End7/11/2022 6:42 AM, 05/18/2023 7:02 AM Patient location: Pre-op. Preanesthetic checklist: patient identified, IV checked, site marked, risks and benefits discussed, surgical consent, monitors and equipment checked, pre-op evaluation, timeout performed and anesthesia consent Position: supine Lidocaine 1% used for infiltration and patient sedated Hand hygiene performed  and maximum sterile barriers used  Catheter size: 9 Fr Total catheter length 10. MAC introducer Procedure performed using ultrasound guided technique. Ultrasound Notes:anatomy identified, needle tip was noted to be adjacent to the nerve/plexus identified, no ultrasound evidence of intravascular and/or intraneural injection and image(s) printed for medical record Attempts: 1 Following insertion, line sutured, dressing applied and Biopatch. Post procedure assessment: blood return through all ports, free fluid flow and no air  Patient tolerated the procedure well with no immediate complications.

## 2023-05-18 NOTE — Anesthesia Procedure Notes (Signed)
Central Venous Catheter Insertion Performed by: Val Eagle, MD, anesthesiologist Start/End7/11/2022 6:42 AM, 05/18/2023 7:02 AM Patient location: Pre-op. Preanesthetic checklist: patient identified, IV checked, site marked, risks and benefits discussed, surgical consent, monitors and equipment checked, pre-op evaluation, timeout performed and anesthesia consent Hand hygiene performed  and maximum sterile barriers used  PA cath was placed.Swan type:thermodilution Procedure performed without using ultrasound guided technique. Attempts: 1 Patient tolerated the procedure well with no immediate complications.

## 2023-05-18 NOTE — Brief Op Note (Signed)
05/18/2023  1:30 PM  PATIENT:  [name]  67 y.o. male  PRE-OPERATIVE DIAGNOSIS:  AS, TAA  POST-OPERATIVE DIAGNOSIS:  AS, TAA  PROCEDURE:  AORTIC VALVE REPLACEMENT (AVR) USING INSPIRIS RESILIA 23 MM AORTIC VALVE  REPLACEMENT OF ASCENDING AORTIC ANEURYSM USING A 30 X 10 MM HEMASHIELD PLATINUM GRAFT - CIRC ARREST TRANSESOPHAGEAL ECHOCARDIOGRAM   SURGEON:  Surgeon(s) and Role:    * Alleen Borne, MD - Primary  PHYSICIAN ASSISTANT: Aloha Gell PA-C  ASSISTANTS: Virgilio Frees RNFA   ANESTHESIA:   general  EBL:  1050 mL   BLOOD ADMINISTERED:none  DRAINS:  Mediastinal dranis    LOCAL MEDICATIONS USED:  NONE  SPECIMEN:  Source of Specimen:  Thoracic aortic aneurysm, aortic valve  DISPOSITION OF SPECIMEN:  PATHOLOGY  COUNTS:  YES  DICTATION: .Dragon Dictation  PLAN OF CARE: Admit to inpatient   PATIENT DISPOSITION:  ICU - intubated and hemodynamically stable.   Delay start of Pharmacological VTE agent (>24hrs) due to surgical blood loss or risk of bleeding: yes

## 2023-05-18 NOTE — Progress Notes (Addendum)
Patient ID: [name], male   DOB: [birth year], 67 y.o.   MRN: [id number]  TCTS Evening Rounds:   Hemodynamically stable  CI = 1.4 but EF normal with thick ventricle. Give volume as needed.  Still asleep on vent. Precedex is off but not awake yet.   Urine is bloody and now catheter not draining. It flushes easily but not draining. Will replace it. CT output low  CBC    Component Value Date/Time   WBC 10.2 05/18/2023 1409   RBC 3.29 (L) 05/18/2023 1409   HGB 10.0 (L) 05/18/2023 1409   HGB 14.6 03/23/2023 1236   HCT 29.8 (L) 05/18/2023 1409   HCT 44.7 03/23/2023 1236   PLT 102 (L) 05/18/2023 1409   PLT 240 03/23/2023 1236   MCV 90.6 05/18/2023 1409   MCV 92 03/23/2023 1236   MCH 30.4 05/18/2023 1409   MCHC 33.6 05/18/2023 1409   RDW 13.6 05/18/2023 1409   RDW 13.1 03/23/2023 1236   LYMPHSABS 1.6 04/08/2014 0447   MONOABS 0.4 04/08/2014 0447   EOSABS 0.2 04/08/2014 0447   BASOSABS 0.0 04/08/2014 0447     BMET    Component Value Date/Time   NA 140 05/18/2023 1407   NA 141 03/23/2023 1236   K 3.9 05/18/2023 1407   CL 105 05/18/2023 1152   CO2 25 05/14/2023 1400   GLUCOSE 160 (H) 05/18/2023 1152   BUN 14 05/18/2023 1152   BUN 14 03/23/2023 1236   CREATININE 0.70 05/18/2023 1152   CREATININE 0.94 09/20/2020 0820   CALCIUM 9.1 05/14/2023 1400   EGFR 94 03/23/2023 1236   GFRNONAA >60 05/14/2023 1400     A/P:  Stable postop course. Continue current plans

## 2023-05-18 NOTE — Procedures (Signed)
Extubation Procedure Note  Patient Details:   Name: [name] DOB: Dec 27, 1955 MRN: [id number]   Airway Documentation:    Vent end date: 05/18/23 Vent end time: 1925   Evaluation  O2 sats: stable throughout Complications: No apparent complications Patient did tolerate procedure well. Bilateral Breath Sounds: Clear, Diminished   Yes  Pt extubated to 5L [place] per protocol. NIF: -40 VC: 1.5L IS:985ml. Positive cuff leak, pt able to say his name after extubation, no stridor noted.     Berton Bon 05/18/2023, 7:28 PM

## 2023-05-18 NOTE — Anesthesia Procedure Notes (Signed)
Procedure Name: Intubation Date/Time: 05/18/2023 7:50 AM  Performed by: Darryl Nestle, CRNAPre-anesthesia Checklist: Patient identified, Emergency Drugs available, Suction available and Patient being monitored Patient Re-evaluated:Patient Re-evaluated prior to induction Oxygen Delivery Method: Circle system utilized Preoxygenation: Pre-oxygenation with 100% oxygen Induction Type: IV induction Ventilation: Mask ventilation without difficulty Laryngoscope Size: Mac and 3 Grade View: Grade I Tube type: Oral Tube size: 8.0 mm Number of attempts: 1 Airway Equipment and Method: Stylet and Oral airway Placement Confirmation: ETT inserted through vocal cords under direct vision, positive ETCO2 and breath sounds checked- equal and bilateral Secured at: 23 cm Tube secured with: Tape Dental Injury: Teeth and Oropharynx as per pre-operative assessment

## 2023-05-18 NOTE — Transfer of Care (Signed)
Immediate Anesthesia Transfer of Care Note  Patient: [name]  Procedure(s) Performed: AORTIC VALVE REPLACEMENT (AVR) USING INSPIRIS RESILIA 23 MM AORTIC VALVE (Chest) REPLACEMENT OF ASCENDING AORTIC ANEURYSM USING A 30 X 10 MM HEMASHIELD PLATINUM GRAFT (Chest) TRANSESOPHAGEAL ECHOCARDIOGRAM  Patient Location: ICU  Anesthesia Type:General  Level of Consciousness: Patient remains intubated per anesthesia plan  Airway & Oxygen Therapy: Patient remains intubated per anesthesia plan  Post-op Assessment: Report given to RN and Post -op Vital signs reviewed and stable  Post vital signs: Reviewed and stable  Last Vitals:  Vitals Value Taken Time  BP 113/71   Temp 36.1 C 05/18/23 1352  Pulse 80 05/18/23 1352  Resp 16 05/18/23 1352  SpO2 100 % 05/18/23 1352  Vitals shown include unvalidated device data.  Last Pain:  Vitals:   05/18/23 0644  TempSrc:   PainSc: 0-No pain         Complications: No notable events documented.

## 2023-05-18 NOTE — Anesthesia Procedure Notes (Signed)
Arterial Line Insertion Start/End7/11/2022 6:45 AM, 05/18/2023 6:50 AM Performed by: Darryl Nestle, CRNA, CRNA  Patient location: Pre-op. Preanesthetic checklist: patient identified, IV checked, site marked, risks and benefits discussed, surgical consent, monitors and equipment checked, pre-op evaluation, timeout performed and anesthesia consent Lidocaine 1% used for infiltration Right, radial was placed Catheter size: 20 G Hand hygiene performed  and maximum sterile barriers used   Attempts: 1 Procedure performed without using ultrasound guided technique. Following insertion, dressing applied and Biopatch. Post procedure assessment: normal and unchanged  Patient tolerated the procedure well with no immediate complications.

## 2023-05-18 NOTE — Interval H&P Note (Signed)
History and Physical Interval Note:  05/18/2023 7:04 AM  [name]  has presented today for surgery, with the diagnosis of AS, TAA.  The various methods of treatment have been discussed with the patient and family. After consideration of risks, benefits and other options for treatment, the patient has consented to  Procedure(s) with comments: AORTIC VALVE REPLACEMENT (AVR) (N/A) REPLACEMENT OF ASCENDING AORTIC ANEURYSM (N/A) - CIRC ARREST TRANSESOPHAGEAL ECHOCARDIOGRAM (N/A) as a surgical intervention.  The patient's history has been reviewed, patient examined, no change in status, stable for surgery.  I have reviewed the patient's chart and labs.  Questions were answered to the patient's satisfaction.     Alleen Borne

## 2023-05-19 ENCOUNTER — Inpatient Hospital Stay (HOSPITAL_COMMUNITY): Payer: Medicare Other

## 2023-05-19 ENCOUNTER — Encounter (HOSPITAL_COMMUNITY): Payer: Self-pay | Admitting: Surgery

## 2023-05-19 LAB — CBC
HCT: 25 % — ABNORMAL LOW (ref 39.0–52.0)
HCT: 24.7 % — ABNORMAL LOW (ref 39.0–52.0)
Hemoglobin: 8.4 g/dL — ABNORMAL LOW (ref 13.0–17.0)
Hemoglobin: 8 g/dL — ABNORMAL LOW (ref 13.0–17.0)
MCH: 30.3 pg (ref 26.0–34.0)
MCH: 29.7 pg (ref 26.0–34.0)
MCHC: 33.6 g/dL (ref 30.0–36.0)
MCHC: 32.4 g/dL (ref 30.0–36.0)
MCV: 90.3 fL (ref 80.0–100.0)
MCV: 91.8 fL (ref 80.0–100.0)
Platelets: 94 10*3/uL — ABNORMAL LOW (ref 150–400)
Platelets: 106 10*3/uL — ABNORMAL LOW (ref 150–400)
RBC: 2.77 MIL/uL — ABNORMAL LOW (ref 4.22–5.81)
RBC: 2.69 MIL/uL — ABNORMAL LOW (ref 4.22–5.81)
RDW: 13.9 % (ref 11.5–15.5)
RDW: 14.4 % (ref 11.5–15.5)
WBC: 10.9 10*3/uL — ABNORMAL HIGH (ref 4.0–10.5)
WBC: 13 10*3/uL — ABNORMAL HIGH (ref 4.0–10.5)
nRBC: 0 % (ref 0.0–0.2)
nRBC: 0 % (ref 0.0–0.2)

## 2023-05-19 LAB — BASIC METABOLIC PANEL
Anion gap: 7 (ref 5–15)
Anion gap: 8 (ref 5–15)
BUN: 14 mg/dL (ref 8–23)
BUN: 20 mg/dL (ref 8–23)
CO2: 20 mmol/L — ABNORMAL LOW (ref 22–32)
CO2: 22 mmol/L (ref 22–32)
Calcium: 7.8 mg/dL — ABNORMAL LOW (ref 8.9–10.3)
Calcium: 8 mg/dL — ABNORMAL LOW (ref 8.9–10.3)
Chloride: 108 mmol/L (ref 98–111)
Chloride: 103 mmol/L (ref 98–111)
Creatinine, Ser: 1.02 mg/dL (ref 0.61–1.24)
Creatinine, Ser: 1.1 mg/dL (ref 0.61–1.24)
GFR, Estimated: >60 mL/min (ref >60)
GFR, Estimated: >60 mL/min (ref >60)
Glucose, Bld: 122 mg/dL — ABNORMAL HIGH (ref 70–99)
Glucose, Bld: 141 mg/dL — ABNORMAL HIGH (ref 70–99)
Potassium: 4.5 mmol/L (ref 3.5–5.1)
Potassium: 4.2 mmol/L (ref 3.5–5.1)
Sodium: 135 mmol/L (ref 135–145)
Sodium: 133 mmol/L — ABNORMAL LOW (ref 135–145)

## 2023-05-19 LAB — GLUCOSE, CAPILLARY
Glucose-Capillary: 126 mg/dL — ABNORMAL HIGH (ref 70–99)
Glucose-Capillary: 134 mg/dL — ABNORMAL HIGH (ref 70–99)
Glucose-Capillary: 124 mg/dL — ABNORMAL HIGH (ref 70–99)
Glucose-Capillary: 128 mg/dL — ABNORMAL HIGH (ref 70–99)
Glucose-Capillary: 141 mg/dL — ABNORMAL HIGH (ref 70–99)
Glucose-Capillary: 137 mg/dL — ABNORMAL HIGH (ref 70–99)
Glucose-Capillary: 133 mg/dL — ABNORMAL HIGH (ref 70–99)
Glucose-Capillary: 127 mg/dL — ABNORMAL HIGH (ref 70–99)

## 2023-05-19 LAB — BPAM CRYOPRECIPITATE
Blood Product Expiration Date: 202407012359
ISSUE DATE / TIME: 202407011150
ISSUE DATE / TIME: 202407011150
Unit Type and Rh: 5100
Unit Type and Rh: 5100

## 2023-05-19 LAB — MAGNESIUM
Magnesium: 2.3 mg/dL (ref 1.7–2.4)
Magnesium: 2.3 mg/dL (ref 1.7–2.4)

## 2023-05-19 LAB — TYPE AND SCREEN

## 2023-05-19 LAB — PREPARE CRYOPRECIPITATE

## 2023-05-19 LAB — SURGICAL PATHOLOGY

## 2023-05-19 MED ORDER — FUROSEMIDE 10 MG/ML IJ SOLN
40.0000 mg | Freq: Two times a day (BID) | INTRAMUSCULAR | Status: AC
  Administered 2023-05-19 (×2): 40 mg via INTRAVENOUS
  Filled 2023-05-19 (×2): qty 4

## 2023-05-19 MED ORDER — FE FUM-VIT C-VIT B12-FA 460-60-0.01-1 MG PO CAPS
1.0000 | ORAL_CAPSULE | Freq: Every day | ORAL | Status: DC
  Administered 2023-05-19 – 2023-05-25 (×7): 1 via ORAL
  Filled 2023-05-19 (×7): qty 1

## 2023-05-19 MED ORDER — ASPIRIN 81 MG PO TBEC
81.0000 mg | DELAYED_RELEASE_TABLET | Freq: Every day | ORAL | Status: DC
  Administered 2023-05-19 – 2023-05-25 (×6): 81 mg via ORAL
  Filled 2023-05-19 (×7): qty 1

## 2023-05-19 MED ORDER — INSULIN ASPART 100 UNIT/ML IJ SOLN
0.0000 [IU] | INTRAMUSCULAR | Status: DC
  Administered 2023-05-19 – 2023-05-20 (×5): 2 [IU] via SUBCUTANEOUS

## 2023-05-19 MED ORDER — ASPIRIN 81 MG PO CHEW
81.0000 mg | CHEWABLE_TABLET | Freq: Every day | ORAL | Status: DC
  Administered 2023-05-21: 81 mg
  Filled 2023-05-19 (×2): qty 1

## 2023-05-19 MED FILL — Thrombin (Recombinant) For Soln 20000 Unit: CUTANEOUS | Qty: 1 | Status: AC

## 2023-05-19 NOTE — Anesthesia Postprocedure Evaluation (Signed)
Anesthesia Post Note  Patient: [name]  Procedure(s) Performed: AORTIC VALVE REPLACEMENT (AVR) USING INSPIRIS RESILIA 23 MM AORTIC VALVE (Chest) REPLACEMENT OF ASCENDING AORTIC ANEURYSM USING A 30 X 10 MM HEMASHIELD PLATINUM GRAFT (Chest) TRANSESOPHAGEAL ECHOCARDIOGRAM     Patient location during evaluation: SICU Anesthesia Type: General Level of consciousness: sedated Pain management: pain level controlled Vital Signs Assessment: post-procedure vital signs reviewed and stable Respiratory status: patient remains intubated per anesthesia plan Cardiovascular status: stable Postop Assessment: no apparent nausea or vomiting Anesthetic complications: no   No notable events documented.  Last Vitals:  Vitals:   05/19/23 1130 05/19/23 1145  BP: 93/63 93/62  Pulse:    Resp: (!) 0 15  Temp: 37.3 C 37.2 C  SpO2: 100% 100%    Last Pain:  Vitals:   05/19/23 0951  TempSrc:   PainSc: 3                  Brynnan Rodenbaugh

## 2023-05-19 NOTE — Evaluation (Signed)
Physical Therapy Evaluation Patient Details Name: [name] MRN: [id number] DOB: [birth year] Today's Date: 05/19/2023  History of Present Illness  Patient is a 67 y/o male admitted 05/18/23 with severe aortic stenosis Thoracic aortic aneurysm and underwent AVR and replacement of ascending aortic aneurysm and TTE.  PMH positive for NSTEMI 2015 c/p cath, renal cell carcinoma s/p partial R nephrectomy 2013, severe degenerative spine disease s/p low back surgery in 80's and cervical disc surgery 2022.  Clinical Impression  Patient presents with decreased mobility due to pain in chest and back, decreased activity tolerance, decreased balance, and generalized weakness.  Previously walking with cane due to back pain and LE numbness and tingling though denies falls.  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  Patient currently needing min A +2 for safety with lines and for chair follow to ambulate in hallway with RW.  Patient with drop in BP while upright but denied symptoms except for weakness and fatigue after returned to supine.  He will benefit from skilled PT in the acute setting and from follow up HHPT and aide at d/c.          Assistance Recommended at Discharge Frequent or constant Supervision/Assistance  If plan is discharge home, recommend the following:  Can travel by private vehicle  A little help with walking and/or transfers;A little help with bathing/dressing/bathroom;Assistance with cooking/housework;Assist for transportation;Help with stairs or ramp for entrance        Equipment Recommendations BSC/3in1  Recommendations for Other Services       Functional Status Assessment Patient has had a recent decline in their functional status and demonstrates the ability to make significant improvements in function in a reasonable and predictable amount of time.     Precautions / Restrictions Precautions Precautions: Fall;Sternal Precaution Booklet Issued: No      Mobility  Bed  Mobility Overal bed mobility: Needs Assistance Bed Mobility: Rolling, Sidelying to Sit, Sit to Sidelying Rolling: Min assist Sidelying to sit: Min assist     Sit to sidelying: Min assist General bed mobility comments: cues for technique assist for precautions, somewhat impulsively using arms    Transfers Overall transfer level: Needs assistance Equipment used: Ambulation equipment used Transfers: Sit to/from Stand Sit to Stand: Min assist           General transfer comment: cues for not reaching up to EVA walker; to sit cues for hand on knees    Ambulation/Gait Ambulation/Gait assistance: Min assist, +2 safety/equipment Gait Distance (Feet): 150 Feet Assistive device: Fara Boros Gait Pattern/deviations: Step-through pattern, Decreased stride length, Decreased dorsiflexion - left, Decreased dorsiflexion - right, Trendelenburg       General Gait Details: R trendelenberg, noting decreased heel strike, at times dragging R leg; assist to guide EVA walker and for multiple lines with chair follow for safety  Stairs            Wheelchair Mobility     Tilt Bed    Modified Rankin (Stroke Patients Only)       Balance Overall balance assessment: Needs assistance Sitting-balance support: Feet supported Sitting balance-Leahy Scale: Fair     Standing balance support: Bilateral upper extremity supported Standing balance-Leahy Scale: Poor                               Pertinent Vitals/Pain Pain Assessment Pain Assessment: 0-10 Pain Score: 7  Pain Location: chest/back Pain Descriptors / Indicators: Aching, Discomfort,  Grimacing Pain Intervention(s): Monitored during session, Repositioned, RN gave pain meds during session    Home Living Family/patient expects to be discharged to:: Private residence Living Arrangements: Spouse/significant other Available Help at Discharge: Family;Available PRN/intermittently Type of Home: House         Home  Layout: One level Home Equipment: Agricultural consultant (2 wheels);Cane - single point;Wheelchair - manual      Prior Function Prior Level of Function : Independent/Modified Independent             Mobility Comments: some dificulty due to back pain, using cane at home, caring for wife who has cancer; son helping some, but he has twins at home to care for as well       Hand Dominance   Dominant Hand: Right    Extremity/Trunk Assessment   Upper Extremity Assessment Upper Extremity Assessment: LUE deficits/detail;RUE deficits/detail RUE Sensation: decreased light touch;history of peripheral neuropathy (finger tingling and numbness) LUE Sensation: history of peripheral neuropathy;decreased light touch    Lower Extremity Assessment Lower Extremity Assessment: LLE deficits/detail;RLE deficits/detail RLE Deficits / Details: AROM WFL, weakness in ankle DF noted pt relates from prior to surgery, weakness R hip noted with ambulation (pt states R side is his weak side); numbness and tingling in feet and lower legs RLE Sensation: decreased light touch;history of peripheral neuropathy LLE Deficits / Details: AROM WFL, ankle DF weakness noted pt relates prior to surgery, numbness tingling in feet and lower legs LLE Sensation: decreased light touch;history of peripheral neuropathy       Communication   Communication: No difficulties  Cognition Arousal/Alertness: Awake/alert Behavior During Therapy: WFL for tasks assessed/performed Overall Cognitive Status: Within Functional Limits for tasks assessed                                 General Comments: not formally tested        General Comments General comments (skin integrity, edema, etc.): BP drop in sitting 88/54 initially; after ambulation first reading in supine 64/52 with RN in room repositioned BP cuff and second reading 98/62; pt relates after ambualtion feeling weak, maintained on 3 L O2 throughout with SpO2 90 or  greater    Exercises     Assessment/Plan    PT Assessment Patient needs continued PT services  PT Problem List         PT Treatment Interventions DME instruction;Functional mobility training;Balance training;Patient/family education;Gait training;Therapeutic activities;Therapeutic exercise    PT Goals (Current goals can be found in the Care Plan section)  Acute Rehab PT Goals Patient Stated Goal: to return to independent/care for his wife PT Goal Formulation: With patient Time For Goal Achievement: 06/02/23 Potential to Achieve Goals: Good    Frequency Min 1X/week     Co-evaluation               AM-PAC PT "6 Clicks" Mobility  Outcome Measure Help needed turning from your back to your side while in a flat bed without using bedrails?: A Lot Help needed moving from lying on your back to sitting on the side of a flat bed without using bedrails?: A Lot Help needed moving to and from a bed to a chair (including a wheelchair)?: A Lot Help needed standing up from a chair using your arms (e.g., wheelchair or bedside chair)?: A Lot Help needed to walk in hospital room?: A Lot Help needed climbing 3-5 steps with a railing? : Total  6 Click Score: 11    End of Session Equipment Utilized During Treatment: Gait belt;Oxygen Activity Tolerance: Patient limited by fatigue Patient left: in bed;with call bell/phone within reach   PT Visit Diagnosis: Muscle weakness (generalized) (M62.81);Other abnormalities of gait and mobility (R26.89);Other symptoms and signs involving the nervous system (R29.898);Pain Pain - part of body:  (chest and back)    Time: 9604-5409 PT Time Calculation (min) (ACUTE ONLY): 29 min   Charges:   PT Evaluation $PT Eval Moderate Complexity: 1 Mod PT Treatments $Gait Training: 8-22 mins PT General Charges $$ ACUTE PT VISIT: 1 Visit         Sheran Lawless, PT Acute Rehabilitation Services Office:[phone] 05/19/2023   Elray Mcgregor 05/19/2023, 2:25  PM

## 2023-05-19 NOTE — Progress Notes (Signed)
EVENING ROUNDS NOTE :     [address]       [name] [address]             [phone]                 1 Day Post-Op Procedure(s) (LRB): AORTIC VALVE REPLACEMENT (AVR) USING INSPIRIS RESILIA 23 MM AORTIC VALVE (N/A) REPLACEMENT OF ASCENDING AORTIC ANEURYSM USING A 30 X 10 MM HEMASHIELD PLATINUM GRAFT (N/A) TRANSESOPHAGEAL ECHOCARDIOGRAM (N/A)   Total Length of Stay:  LOS: 1 day  Events:   No events Doing well    BP 105/61   Pulse 84   Temp 99.7 F (37.6 C)   Resp 19   Ht 5\' 8"  (1.727 m)   Wt 79.6 kg   SpO2 99%   BMI 26.68 kg/m   PAP: (10-35)/(2-24) 27/12 CO:  [2.8 L/min-5.3 L/min] 4.4 L/min CI:  [1.5 L/min/m2-2.8 L/min/m2] 2.3 L/min/m2  Vent Mode: PSV;CPAP FiO2 (%):  [40 %] 40 % Set Rate:  [4 bmp] 4 bmp PEEP:  [5 cmH20] 5 cmH20 Pressure Support:  [8 cmH20] 8 cmH20   sodium chloride Stopped (05/19/23 0802)   sodium chloride Stopped (05/19/23 0754)   sodium chloride Stopped (05/18/23 1606)    ceFAZolin (ANCEF) IV 2 g (05/19/23 1423)   lactated ringers Stopped (05/18/23 1457)   lactated ringers Stopped (05/19/23 0756)   nitroGLYCERIN Stopped (05/18/23 1457)   phenylephrine (NEO-SYNEPHRINE) Adult infusion Stopped (05/19/23 0305)    I/O last 3 completed shifts: In: 6511.1 [I.V.:3229.3; Blood:783; Other:360; IV Piggyback:2138.7] Out: 5565 [Urine:3855; Emesis/NG output:100; Blood:1050; Chest Tube:560]      Latest Ref Rng & Units 05/19/2023    5:00 AM 05/18/2023    8:30 PM 05/18/2023    7:45 PM  CBC  WBC 4.0 - 10.5 K/uL 10.9   11.7   Hemoglobin 13.0 - 17.0 g/dL 8.4  8.8  9.6   Hematocrit 39.0 - 52.0 % 25.0  26.0  29.3   Platelets 150 - 400 K/uL 94   112        Latest Ref Rng & Units 05/19/2023    5:00 AM 05/18/2023    8:30 PM 05/18/2023    7:45 PM  BMP  Glucose 70 - 99 mg/dL 914   782   BUN 8 - 23 mg/dL 14   14   Creatinine 9.56 - 1.24 mg/dL 2.13   0.86   Sodium 578 - 145 mmol/L 135  138  137   Potassium 3.5 - 5.1 mmol/L 4.5  4.2  4.3    Chloride 98 - 111 mmol/L 108   108   CO2 22 - 32 mmol/L 20   21   Calcium 8.9 - 10.3 mg/dL 7.8   7.6     ABG    Component Value Date/Time   PHART 7.371 05/18/2023 2030   PCO2ART 28.7 (L) 05/18/2023 2030   PO2ART 121 (H) 05/18/2023 2030   HCO3 16.6 (L) 05/18/2023 2030   TCO2 17 (L) 05/18/2023 2030   ACIDBASEDEF 8.0 (H) 05/18/2023 2030   O2SAT 99 05/18/2023 2030       Curtis Greathouse, MD 05/19/2023 4:20 PM

## 2023-05-19 NOTE — Progress Notes (Signed)
1 Day Post-Op Procedure(s) (LRB): AORTIC VALVE REPLACEMENT (AVR) USING INSPIRIS RESILIA 23 MM AORTIC VALVE (N/A) REPLACEMENT OF ASCENDING AORTIC ANEURYSM USING A 30 X 10 MM HEMASHIELD PLATINUM GRAFT (N/A) TRANSESOPHAGEAL ECHOCARDIOGRAM (N/A) Subjective: No complaints.  Objective: Vital signs in last 24 hours: Temp:  [96.3 F (35.7 C)-99.5 F (37.5 C)] 98.8 F (37.1 C) (07/02 0630) Pulse Rate:  [80-95] 80 (07/02 0630) Cardiac Rhythm: Atrial paced (07/02 0400) Resp:  [7-25] 12 (07/02 0630) BP: (88-136)/(58-81) 108/63 (07/02 0630) SpO2:  [95 %-100 %] 97 % (07/02 0630) Arterial Line BP: (85-153)/(48-76) 124/54 (07/02 0630) FiO2 (%):  [40 %-50 %] 40 % (07/01 1900) Weight:  [79.6 kg] 79.6 kg (07/02 0500)  Hemodynamic parameters for last 24 hours: PAP: (10-35)/(2-24) 22/10 CO:  [2.4 L/min-5.3 L/min] 4.4 L/min CI:  [1.3 L/min/m2-2.8 L/min/m2] 2.3 L/min/m2  Intake/Output from previous day: 07/01 0701 - 07/02 0700 In: 6511.1 [I.V.:3229.3; Blood:783; IV Piggyback:2138.7] Out: 5565 [Urine:3855; Emesis/NG output:100; Blood:1050; Chest Tube:560] Intake/Output this shift: Total I/O In: 1365.3 [I.V.:415.2; Other:300; IV Piggyback:650.1] Out: 1770 [Urine:1450; Chest Tube:320]  General appearance: alert and cooperative Neurologic: intact Heart: regular rate and rhythm, S1, S2 normal, no murmur Lungs: clear to auscultation bilaterally Abdomen: soft, non-tender; bowel sounds normal Extremities: edema mild Wound: dressing dry  Lab Results: Recent Labs    05/18/23 1945 05/18/23 2030 05/19/23 0500  WBC 11.7*  --  10.9*  HGB 9.6* 8.8* 8.4*  HCT 29.3* 26.0* 25.0*  PLT 112*  --  94*   BMET:  Recent Labs    05/18/23 1945 05/18/23 2030 05/19/23 0500  NA 137 138 135  K 4.3 4.2 4.5  CL 108  --  108  CO2 21*  --  20*  GLUCOSE 131*  --  122*  BUN 14  --  14  CREATININE 1.03  --  1.02  CALCIUM 7.6*  --  7.8*    PT/INR:  Recent Labs    05/18/23 1409  LABPROT 19.5*  INR 1.6*    ABG    Component Value Date/Time   PHART 7.371 05/18/2023 2030   HCO3 16.6 (L) 05/18/2023 2030   TCO2 17 (L) 05/18/2023 2030   ACIDBASEDEF 8.0 (H) 05/18/2023 2030   O2SAT 99 05/18/2023 2030   CBG (last 3)  Recent Labs    05/19/23 0105 05/19/23 0303 05/19/23 0503  GLUCAP 134* 124* 128*   CXR: mild left base atelectasis  ECG:  Assessment/Plan: S/P Procedure(s) (LRB): AORTIC VALVE REPLACEMENT (AVR) USING INSPIRIS RESILIA 23 MM AORTIC VALVE (N/A) REPLACEMENT OF ASCENDING AORTIC ANEURYSM USING A 30 X 10 MM HEMASHIELD PLATINUM GRAFT (N/A) TRANSESOPHAGEAL ECHOCARDIOGRAM (N/A)  POD 1 Hemodynamically stable in sinus rhythm in 70's. Continue low dose Lopressor.  Volume excess: start diuresis.  DC swan, arterial line.  Keep chest tubes in today.  Thrombocytopenia: no lovenox. Decrease ASA to 81 mg.  Hematuria: due to foley trauma with BPH. Keep catheter in for a few days.  IS, OOB.   LOS: 1 day    Alleen Borne 05/19/2023

## 2023-05-20 ENCOUNTER — Inpatient Hospital Stay (HOSPITAL_COMMUNITY): Payer: Medicare Other

## 2023-05-20 LAB — CBC
HCT: 24.6 % — ABNORMAL LOW (ref 39.0–52.0)
Hemoglobin: 8.2 g/dL — ABNORMAL LOW (ref 13.0–17.0)
MCH: 30.9 pg (ref 26.0–34.0)
MCHC: 33.3 g/dL (ref 30.0–36.0)
MCV: 92.8 fL (ref 80.0–100.0)
Platelets: 97 10*3/uL — ABNORMAL LOW (ref 150–400)
RBC: 2.65 MIL/uL — ABNORMAL LOW (ref 4.22–5.81)
RDW: 14.4 % (ref 11.5–15.5)
WBC: 11.3 10*3/uL — ABNORMAL HIGH (ref 4.0–10.5)
nRBC: 0 % (ref 0.0–0.2)

## 2023-05-20 LAB — BASIC METABOLIC PANEL
Anion gap: 8 (ref 5–15)
BUN: 22 mg/dL (ref 8–23)
CO2: 24 mmol/L (ref 22–32)
Calcium: 8.2 mg/dL — ABNORMAL LOW (ref 8.9–10.3)
Chloride: 103 mmol/L (ref 98–111)
Creatinine, Ser: 1.09 mg/dL (ref 0.61–1.24)
GFR, Estimated: >60 mL/min (ref >60)
Glucose, Bld: 132 mg/dL — ABNORMAL HIGH (ref 70–99)
Potassium: 3.9 mmol/L (ref 3.5–5.1)
Sodium: 135 mmol/L (ref 135–145)

## 2023-05-20 LAB — GLUCOSE, CAPILLARY
Glucose-Capillary: 111 mg/dL — ABNORMAL HIGH (ref 70–99)
Glucose-Capillary: 117 mg/dL — ABNORMAL HIGH (ref 70–99)
Glucose-Capillary: 125 mg/dL — ABNORMAL HIGH (ref 70–99)
Glucose-Capillary: 133 mg/dL — ABNORMAL HIGH (ref 70–99)
Glucose-Capillary: 142 mg/dL — ABNORMAL HIGH (ref 70–99)

## 2023-05-20 MED ORDER — POTASSIUM CHLORIDE CRYS ER 20 MEQ PO TBCR: 20.0000 meq | EXTENDED_RELEASE_TABLET | Freq: Two times a day (BID) | ORAL | Status: DC

## 2023-05-20 MED ORDER — SODIUM CHLORIDE 0.9 % IV SOLN: 250.0000 mL | INTRAVENOUS | Status: DC | PRN

## 2023-05-20 MED ORDER — FUROSEMIDE 40 MG PO TABS: 40.0000 mg | ORAL_TABLET | Freq: Every day | ORAL | Status: DC

## 2023-05-20 MED ORDER — BISACODYL 5 MG PO TBEC: 10.0000 mg | DELAYED_RELEASE_TABLET | Freq: Every day | ORAL | Status: DC | PRN

## 2023-05-20 MED ORDER — POTASSIUM CHLORIDE CRYS ER 10 MEQ PO TBCR
10.0000 meq | EXTENDED_RELEASE_TABLET | Freq: Every day | ORAL | Status: AC
  Administered 2023-05-20 – 2023-05-21 (×2): 10 meq via ORAL
  Filled 2023-05-20 (×2): qty 1

## 2023-05-20 MED ORDER — SODIUM CHLORIDE 0.9% FLUSH: 3.0000 mL | INTRAVENOUS | Status: DC | PRN

## 2023-05-20 MED ORDER — BISACODYL 10 MG RE SUPP
10.0000 mg | Freq: Every day | RECTAL | Status: DC | PRN
  Administered 2023-05-21: 10 mg via RECTAL
  Filled 2023-05-20: qty 1

## 2023-05-20 MED ORDER — SODIUM CHLORIDE 0.9% FLUSH
3.0000 mL | Freq: Two times a day (BID) | INTRAVENOUS | Status: DC
  Administered 2023-05-20 – 2023-05-25 (×10): 3 mL via INTRAVENOUS

## 2023-05-20 MED ORDER — ~~LOC~~ CARDIAC SURGERY, PATIENT & FAMILY EDUCATION: Freq: Once | Status: AC

## 2023-05-20 MED ORDER — FUROSEMIDE 20 MG PO TABS
20.0000 mg | ORAL_TABLET | Freq: Every day | ORAL | Status: DC
  Administered 2023-05-20: 20 mg via ORAL
  Filled 2023-05-20: qty 1

## 2023-05-20 MED ORDER — CHLORHEXIDINE GLUCONATE CLOTH 2 % EX PADS
6.0000 | MEDICATED_PAD | Freq: Every day | CUTANEOUS | Status: DC
  Administered 2023-05-20 – 2023-05-24 (×5): 6 via TOPICAL

## 2023-05-20 MED FILL — Heparin Sodium (Porcine) Inj 1000 Unit/ML: Qty: 1000 | Status: AC

## 2023-05-20 MED FILL — Potassium Chloride Inj 2 mEq/ML: INTRAVENOUS | Qty: 40 | Status: AC

## 2023-05-20 MED FILL — Lidocaine HCl Local Preservative Free (PF) Inj 2%: INTRAMUSCULAR | Qty: 14 | Status: AC

## 2023-05-20 NOTE — Evaluation (Signed)
Occupational Therapy Evaluation Patient Details Name: [name] MRN: [id number] DOB: 1956/08/18 Today's Date: 05/20/2023   History of Present Illness Patient is a 67 y/o male admitted 05/18/23 with severe aortic stenosis Thoracic aortic aneurysm and underwent AVR and replacement of ascending aortic aneurysm and TTE.  PMH positive for NSTEMI 2015 c/p cath, renal cell carcinoma s/p partial R nephrectomy 2013, severe degenerative spine disease s/p low back surgery in 80's and cervical disc surgery 2022.   Clinical Impression   Pt reports ind at baseline with ADLs and functional mobility, using cane PRN and reports ADLs "take him some time". Pt currently needing min guard-total A for ADLs, min guard-min A for bed mobility, and min A for transfers with Carley Hammed walker. Pt reports mild dizziness and unsteadiness noted with standing grooming task at sink, returned to bed and BP taken 127/78 (94). Pt educated on sternal precautions and handout provided, pt min cues for sternal precautions with bed mobility. Pt presenting with impairments listed below, will follow acutely. Recommend HHOT at d/c pending progression.      Recommendations for follow up therapy are one component of a multi-disciplinary discharge planning process, led by the attending physician.  Recommendations may be updated based on patient status, additional functional criteria and insurance authorization.   Assistance Recommended at Discharge Intermittent Supervision/Assistance  Patient can return home with the following A little help with walking and/or transfers;A little help with bathing/dressing/bathroom;Assistance with cooking/housework;Direct supervision/assist for medications management;Direct supervision/assist for financial management;Assist for transportation;Help with stairs or ramp for entrance    Functional Status Assessment  Patient has had a recent decline in their functional status and demonstrates the ability to make  significant improvements in function in a reasonable and predictable amount of time.  Equipment Recommendations  Tub/shower seat    Recommendations for Other Services PT consult     Precautions / Restrictions Precautions Precautions: Fall;Sternal Precaution Booklet Issued: Yes (comment) Precaution Comments: pt educated on sternal prec, chest tube, ext pacer Restrictions RUE Weight Bearing: Non weight bearing LUE Weight Bearing: Non weight bearing Other Position/Activity Restrictions: sternal prec      Mobility Bed Mobility Overal bed mobility: Needs Assistance Bed Mobility: Sit to Sidelying Rolling: Min guard       Sit to sidelying: Min assist General bed mobility comments: min cues for sternal prec, use of heart pillow    Transfers Overall transfer level: Needs assistance Equipment used: Ambulation equipment used Transfers: Sit to/from Stand Sit to Stand: Min assist           General transfer comment: eva walker; cues to place hands on knees prior to sitting      Balance Overall balance assessment: Needs assistance Sitting-balance support: Feet supported Sitting balance-Leahy Scale: Fair     Standing balance support: Bilateral upper extremity supported Standing balance-Leahy Scale: Poor                             ADL either performed or assessed with clinical judgement   ADL Overall ADL's : Needs assistance/impaired Eating/Feeding: Set up;Sitting   Grooming: Min guard;Standing;Oral care   Upper Body Bathing: Moderate assistance;Sitting   Lower Body Bathing: Moderate assistance;Sitting/lateral leans;Sit to/from stand   Upper Body Dressing : Moderate assistance;Sitting   Lower Body Dressing: Moderate assistance;Sitting/lateral leans   Toilet Transfer: Minimal assistance;Ambulation;Regular Toilet   Toileting- Architect and Hygiene: Total assistance       Functional mobility during ADLs: Minimal assistance  Vision Baseline Vision/History: 1 Wears glasses Vision Assessment?: No apparent visual deficits     Perception Perception Perception Tested?: No   Praxis Praxis Praxis tested?: Not tested    Pertinent Vitals/Pain Pain Assessment Pain Assessment: No/denies pain     Hand Dominance Right   Extremity/Trunk Assessment Upper Extremity Assessment Upper Extremity Assessment: Generalized weakness   Lower Extremity Assessment Lower Extremity Assessment: Defer to PT evaluation   Cervical / Trunk Assessment Cervical / Trunk Assessment: Normal   Communication Communication Communication: No difficulties   Cognition Arousal/Alertness: Awake/alert Behavior During Therapy: WFL for tasks assessed/performed, Flat affect                                   General Comments: initially states it is June 5th, then can correct to July     General Comments  VSS on 3L O2, pt reporting dizziness after ambulating to sink/performing grooming task, BP 127/78 (94) supine post session    Exercises     Shoulder Instructions      Home Living Family/patient expects to be discharged to:: Private residence Living Arrangements: Spouse/significant other Available Help at Discharge: Family;Available PRN/intermittently Type of Home: House Home Access: Stairs to enter Entrance Stairs-Number of Steps: 4   Home Layout: One level     Bathroom Shower/Tub: Walk-in shower         Home Equipment: Agricultural consultant (2 wheels);Cane - single point;Wheelchair - manual;BSC/3in1          Prior Functioning/Environment Prior Level of Function : Independent/Modified Independent;Driving             Mobility Comments: uses cane PRN ADLs Comments: ind, reports it just takes time        OT Problem List: Decreased strength;Decreased range of motion;Decreased activity tolerance;Impaired balance (sitting and/or standing);Decreased safety awareness;Cardiopulmonary status limiting  activity;Decreased knowledge of precautions      OT Treatment/Interventions: Self-care/ADL training;Therapeutic exercise;Energy conservation;DME and/or AE instruction;Therapeutic activities;Patient/family education;Balance training    OT Goals(Current goals can be found in the care plan section) Acute Rehab OT Goals Patient Stated Goal: none stated OT Goal Formulation: With patient Time For Goal Achievement: 06/03/23 Potential to Achieve Goals: Good  OT Frequency: Min 1X/week    Co-evaluation              AM-PAC OT "6 Clicks" Daily Activity     Outcome Measure Help from another person eating meals?: A Little Help from another person taking care of personal grooming?: A Little Help from another person toileting, which includes using toliet, bedpan, or urinal?: A Lot Help from another person bathing (including washing, rinsing, drying)?: A Lot Help from another person to put on and taking off regular upper body clothing?: A Lot Help from another person to put on and taking off regular lower body clothing?: A Lot 6 Click Score: 14   End of Session Equipment Utilized During Treatment: Other (comment);Oxygen (eva walker) Nurse Communication: Mobility status  Activity Tolerance: Patient tolerated treatment well Patient left: in bed;with call bell/phone within reach (RN to pull wires/tubes)  OT Visit Diagnosis: Unsteadiness on feet (R26.81);Other abnormalities of gait and mobility (R26.89);Muscle weakness (generalized) (M62.81)                Time: 1610-9604 OT Time Calculation (min): 27 min Charges:  OT General Charges $OT Visit: 1 Visit OT Evaluation $OT Eval Moderate Complexity: 1 Mod OT Treatments $Self Care/Home Management : 8-22  mins  Carver Fila, OTD, OTR/L SecureChat Preferred Acute Rehab [phone]   Dalphine Handing 05/20/2023, 8:50 AM

## 2023-05-20 NOTE — Progress Notes (Addendum)
2 Days Post-Op Procedure(s) (LRB): AORTIC VALVE REPLACEMENT (AVR) USING INSPIRIS RESILIA 23 MM AORTIC VALVE (N/A) REPLACEMENT OF ASCENDING AORTIC ANEURYSM USING A 30 X 10 MM HEMASHIELD PLATINUM GRAFT (N/A) TRANSESOPHAGEAL ECHOCARDIOGRAM (N/A) Subjective: Feels pretty well overall  Objective: Vital signs in last 24 hours: Temp:  [93 F (33.9 C)-99.9 F (37.7 C)] 97.8 F (36.6 C) (07/02 2300) Pulse Rate:  [72-90] 74 (07/03 0000) Cardiac Rhythm: Normal sinus rhythm (07/03 0400) Resp:  [0-24] 15 (07/03 0500) BP: (83-125)/(41-82) 118/72 (07/03 0500) SpO2:  [94 %-100 %] 99 % (07/03 0500) Arterial Line BP: (98-133)/(45-63) 115/55 (07/02 0800)  Hemodynamic parameters for last 24 hours: PAP: (27-29)/(12-16) 27/12  Intake/Output from previous day: 07/02 0701 - 07/03 0700 In: 282.6 [I.V.:82.6; IV Piggyback:100] Out: 3235 [Urine:2885; Chest Tube:350] Intake/Output this shift: No intake/output data recorded.  General appearance: alert, cooperative, and no distress Heart: regular rate and rhythm and no murmur Lungs: clear Abdomen: benign Extremities: no edema Wound: incis healing well  Lab Results: Recent Labs    05/19/23 1721 05/20/23 0514  WBC 13.0* 11.3*  HGB 8.0* 8.2*  HCT 24.7* 24.6*  PLT 106* 97*   BMET:  Recent Labs    05/19/23 1721 05/20/23 0514  NA 133* 135  K 4.2 3.9  CL 103 103  CO2 22 24  GLUCOSE 141* 132*  BUN 20 22  CREATININE 1.10 1.09  CALCIUM 8.0* 8.2*    PT/INR:  Recent Labs    05/18/23 1409  LABPROT 19.5*  INR 1.6*   ABG    Component Value Date/Time   PHART 7.371 05/18/2023 2030   HCO3 16.6 (L) 05/18/2023 2030   TCO2 17 (L) 05/18/2023 2030   ACIDBASEDEF 8.0 (H) 05/18/2023 2030   O2SAT 99 05/18/2023 2030   CBG (last 3)  Recent Labs    05/19/23 2030 05/20/23 0023 05/20/23 0355  GLUCAP 127* 133* 111*    Meds Scheduled Meds:  acetaminophen  1,000 mg Oral Q6H   Or   acetaminophen (TYLENOL) oral liquid 160 mg/5 mL  1,000 mg Per  Tube Q6H   albuterol  3 mL Inhalation BID   aspirin EC  81 mg Oral Daily   Or   aspirin  81 mg Per Tube Daily   atorvastatin  80 mg Oral Daily   baclofen  10 mg Oral Daily   bisacodyl  10 mg Oral Daily   Or   bisacodyl  10 mg Rectal Daily   Chlorhexidine Gluconate Cloth  6 each Topical Daily   Chlorhexidine Gluconate Cloth  6 each Topical Daily   docusate sodium  200 mg Oral Daily   Fe Fum-Vit C-Vit B12-FA  1 capsule Oral QPC breakfast   finasteride  5 mg Oral Daily   insulin aspart  0-24 Units Subcutaneous Q4H   metoprolol tartrate  12.5 mg Oral BID   Or   metoprolol tartrate  12.5 mg Per Tube BID   pantoprazole  40 mg Oral Daily   sodium chloride flush  10-40 mL Intracatheter Q12H   sodium chloride flush  3 mL Intravenous Q12H   Continuous Infusions:  sodium chloride Stopped (05/19/23 0802)   sodium chloride Stopped (05/19/23 0754)   sodium chloride Stopped (05/18/23 1606)   lactated ringers Stopped (05/18/23 1457)   lactated ringers Stopped (05/19/23 0756)   nitroGLYCERIN Stopped (05/18/23 1457)   phenylephrine (NEO-SYNEPHRINE) Adult infusion Stopped (05/19/23 0305)   PRN Meds:.sodium chloride, metoprolol tartrate, morphine injection, ondansetron (ZOFRAN) IV, oxyCODONE, sodium chloride flush, sodium chloride flush, traMADol  Xrays DG Chest Port 1 View  Result Date: 05/19/2023 CLINICAL DATA:  Postop aortic valve replacement and ascending aortic repair. EXAM: PORTABLE CHEST 1 VIEW COMPARISON:  Radiographs 05/18/2023 and 05/14/2023. Cardiac CT 04/14/2023. FINDINGS: 0540 hours. Interval extubation and removal of the enteric tube. Mediastinal drains and right IJ Swan-Ganz catheter are unchanged in position. The heart size and mediastinal contours are stable status post recent median sternotomy and aortic valve replacement. There is mildly increased left basilar atelectasis with a probable small left pleural effusion. No evidence of pneumothorax. The bones appear unchanged. Telemetry  leads overlie the chest. IMPRESSION: Interval extubation with mildly increased left basilar atelectasis and probable small left pleural effusion. No pneumothorax. Electronically Signed   By: Carey Bullocks M.D.   On: 05/19/2023 09:55   DG Chest Port 1 View  Result Date: 05/18/2023 CLINICAL DATA:  Status post aortic valve replacement. EXAM: PORTABLE CHEST 1 VIEW COMPARISON:  05/14/2023 FINDINGS: Endotracheal tube tip is 7.7 cm above the base of the carina. The NG tube passes into the stomach although the distal tip position is not included on the film. Right IJ pulmonary artery catheter tip is in the right main pulmonary artery. Midline pericardial/mediastinal drains evident. Telemetry leads overlie the chest. No evidence for pneumothorax. No focal consolidation or substantial pleural effusion. There is probably some trace atelectasis at the left base. IMPRESSION: 1. Support apparatus as described without pneumothorax. 2. Probable trace atelectasis at the left base. Electronically Signed   By: Kennith Center M.D.   On: 05/18/2023 15:01   EP STUDY  Result Date: 05/18/2023 See surgical note for result.   Assessment/Plan: S/P Procedure(s) (LRB): AORTIC VALVE REPLACEMENT (AVR) USING INSPIRIS RESILIA 23 MM AORTIC VALVE (N/A) REPLACEMENT OF ASCENDING AORTIC ANEURYSM USING A 30 X 10 MM HEMASHIELD PLATINUM GRAFT (N/A) TRANSESOPHAGEAL ECHOCARDIOGRAM (N/A)  POD#2   1 Tmax 99.9, No gtts,  S BP 98-133 by a- line, sinus rhythm, occas PVC 2 O2 sats good on 2 liters  3 excellent UOP, not weighed yet, normal renal fxn 4 CT 350cc/24h 5 minor reactive leukocytosis- trending lower, WBC 11.3 6 expected ABLA, has stabilized, Iron supplement ordered, hematuria resolving from difficult foley placement, on proscar as well 7 plan to get pacer wires out, then chest tubes today a few hours later 8 Blood sugars well controlled, Hgb A1C 5.8 on no meds 9 CXR minor atx, minimal left effus 10 transfer to floor after tubes  out  11 routine pulm hygiene and rehab      LOS: 2 days    Rowe Clack PA-C Pager [phone] 05/20/2023   Chart reviewed, patient examined, agree with above. He looks good overall. -3L yesterday but wt unchanged. Doubt wts are accurate. Will continue po diuretic for a few more days and replace K+. His urine is still pink. Will remove foley. He has BPH on Flomax but no urinary symptoms at home.

## 2023-05-20 NOTE — Progress Notes (Signed)
      [address]       [name] [address]             [phone]      Foley removed at 2 PM, hasn't voided since then  BP 105/82   Pulse 74   Temp 97.9 F (36.6 C) (Oral)   Resp (!) 8   Ht 5\' 8"  (1.727 m)   Wt 79.7 kg   SpO2 99%   BMI 26.70 kg/m    Intake/Output Summary (Last 24 hours) at 05/20/2023 1800 Last data filed at 05/20/2023 1300 Gross per 24 hour  Intake 500 ml  Output 2265 ml  Net -1765 ml   Continue current care  Reynard Christoffersen C. Dorris Fetch, MD [facility] [phone]

## 2023-05-20 NOTE — TOC Initial Note (Signed)
Transition of Care [facility]) - Initial/Assessment Note    Patient Details  Name: [name] MRN: [id number] Date of Birth: 1955/12/13  [facility] CM/SW Contact:    Curtis Cousin, RN Phone Number: [phone] 05/20/2023, 3:26 PM  Clinical Narrative: CM spoke to pt at bedside. States wife at home to assist with care. States he was independent PTA. Explained HH was preoperatively arranged with [facility]. Pt agreeable to [facility]. No DME needed at this time.                  Expected Discharge Plan: Home w Home Health Services Barriers to Discharge: Continued Medical Work up   Patient Goals and CMS Choice Patient states their goals for this hospitalization and ongoing recovery are:: wants to get better CMS Medicare.gov Compare Post Acute Care list provided to:: Patient Choice offered to / list presented to : Patient      Expected Discharge Plan and Services   Discharge Planning Services: CM Consult Post Acute Care Choice: Home Health Living arrangements for the past 2 months: Single Family Home                           HH Arranged: RN, PT HH Agency: [facility]        Prior Living Arrangements/Services Living arrangements for the past 2 months: Single Family Home Lives with:: Spouse, Adult Children Patient language and need for interpreter reviewed:: Yes Do you feel safe going back to the place where you live?: Yes      Need for Family Participation in Patient Care: No (Comment) Care giver support system in place?: Yes (comment)   Criminal Activity/Legal Involvement Pertinent to Current Situation/Hospitalization: No - Comment as needed  Activities of Daily Living Home Assistive Devices/Equipment: Cane (specify quad or straight), Eyeglasses, Walker (specify type) ADL Screening (condition at time of admission) Patient's cognitive ability adequate to safely complete daily activities?: Yes Is the patient deaf or have difficulty  hearing?: No Does the patient have difficulty seeing, even when wearing glasses/contacts?: No Does the patient have difficulty concentrating, remembering, or making decisions?: No Patient able to express need for assistance with ADLs?: Yes Does the patient have difficulty dressing or bathing?: No Independently performs ADLs?: Yes (appropriate for developmental age) Does the patient have difficulty walking or climbing stairs?: Yes Weakness of Legs: Both Weakness of Arms/Hands: Both  Permission Sought/Granted Permission sought to share information with : Case Manager, Family Supports, PCP Permission granted to share information with : Yes, Verbal Permission Granted  Share Information with NAME: [name]  Permission granted to share info w AGENCY: Home Health  Permission granted to share info w Relationship: wife  Permission granted to share info w Contact Information: [phone]  Emotional Assessment Appearance:: Appears stated age Attitude/Demeanor/Rapport: Engaged Affect (typically observed): Accepting Orientation: : Oriented to Self, Oriented to Place, Oriented to  Time, Oriented to Situation   Psych Involvement: No (comment)  Admission diagnosis:  S/P AVR (aortic valve replacement) [Z95.2] Patient Active Problem List   Diagnosis Date Noted   S/P AVR (aortic valve replacement) 05/18/2023   Chronic cough 02/20/2023   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 [facility]) 02/15/2021   Right leg weakness 02/07/2021   Allodynia 02/07/2021   Gait disturbance 02/07/2021   White matter abnormality on MRI of brain 02/07/2021   Hyperreflexia 02/07/2021   Special screening for malignant neoplasms, colon  Adenomatous polyp of transverse colon    Diverticulosis of large intestine without diverticulitis    PSVT (paroxysmal supraventricular tachycardia) 05/29/2014   Precordial pain 04/19/2014   Coronary atherosclerosis of native coronary artery 04/06/2014    Essential hypertension, benign 04/06/2014   HLD (hyperlipidemia) 04/06/2014   PCP:  Practice, [facility]:   [facility] - [place], [place] - [address] [place] [address] Phone: [phone] Fax: [phone]     Social Determinants of Health (SDOH) Social History: SDOH Screenings   Food Insecurity: No Food Insecurity (05/18/2023)  Housing: Low Risk  (05/18/2023)  Transportation Needs: No Transportation Needs (05/18/2023)  Utilities: Not At Risk (05/18/2023)  Tobacco Use: Medium Risk (05/19/2023)   SDOH Interventions:     Readmission Risk Interventions     No data to display

## 2023-05-20 NOTE — Discharge Summary (Incomplete)
[address]       [place] [address]             [phone]    Physician Discharge Summary  Patient ID: [name] MRN: [id number] DOB/AGE: 07-05-1956 67 y.o.  Admit date: 05/18/2023 Discharge date: 05/25/2023  Admission Diagnoses:  Patient Active Problem List   Diagnosis Date Noted   Chronic cough 02/20/2023   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 [facility]) 02/15/2021   Right leg weakness 02/07/2021   Allodynia 02/07/2021   Gait disturbance 02/07/2021   White matter abnormality on MRI of brain 02/07/2021   Hyperreflexia 02/07/2021   Special screening for malignant neoplasms, colon    Adenomatous polyp of transverse colon    Diverticulosis of large intestine without diverticulitis    PSVT (paroxysmal supraventricular tachycardia) 05/29/2014   Precordial pain 04/19/2014   Coronary atherosclerosis of native coronary artery 04/06/2014   Essential hypertension, benign 04/06/2014   HLD (hyperlipidemia) 04/06/2014   Discharge Diagnoses:  Patient Active Problem List   Diagnosis Date Noted   S/P AVR (aortic valve replacement) 05/18/2023   Chronic cough 02/20/2023   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 [facility]) 02/15/2021   Right leg weakness 02/07/2021   Allodynia 02/07/2021   Gait disturbance 02/07/2021   White matter abnormality on MRI of brain 02/07/2021   Hyperreflexia 02/07/2021   Special screening for malignant neoplasms, colon    Adenomatous polyp of transverse colon    Diverticulosis of large intestine without diverticulitis    PSVT (paroxysmal supraventricular tachycardia) 05/29/2014   Precordial pain 04/19/2014   Coronary atherosclerosis of native coronary artery 04/06/2014   Essential hypertension, benign 04/06/2014   HLD (hyperlipidemia) 04/06/2014     Discharged Condition: stable  HPI:   The patient is a 67 year old gentleman with a history of hyperlipidemia,  coronary artery disease status post NSTEMI in 2015 with catheterization showing an occluded proximal RCA with right to right collaterals filling the distal vessel that could not be opened, renal cell carcinoma status post partial right nephrectomy in 2013, severe degenerative spine disease, and newly diagnosed severe aortic stenosis and ascending aortic aneurysm who was referred for consideration of surgical treatment.  He reports a several year history of low back pain and leg numbness, pain and weakness that he has seen multiple physicians for.  He had previous lower back surgery in the 1980s and a cervical disc surgery in 2022.  He reports generalized weakness and fatigue as well as some shortness of breath with activity.  He denies any chest pain or pressure.  He has had some dizziness and reports having blacked out a few times.  He has a lot of difficulty bending over due to profound weakness.  He had a 2D echocardiogram performed on 11/14/2022 due to a heart murmur which he said he had no prior knowledge of.  It showed an indeterminate number of cusps.  There is severe aortic stenosis with a mean gradient of 42 mmHg and a peak gradient of 66 mmHg.  Valve area by VTI was 0.74 cm with a dimensionless index of 0.26.  There is moderate aortic insufficiency.  Left ventricular ejection fraction was 55 to 60% with mild LVH and grade 1 diastolic dysfunction.  Cardiac catheterization on 04/03/2023 showed a known chronic total occlusion of the RCA with right to right collaterals filling the distal vessel.  There is nonobstructive plaquing in the left main,  LAD, and left circumflex without obstruction.  Right heart pressures were normal.  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  Aortic valve calcium score was 2289.  There is a 5.1 x 5.0 cm fusiform ascending aortic aneurysm.   He lives with his wife who he takes care of.  She is a third time cancer  survivor.  Dr. Laneta Simmers reviewed the patient's diagnostic studies and determined he would benefit from surgical intervention. He reviewed the treatment options as well as the risks and benefits of surgery with the patient. Mr. Scopel was agreeable to proceed with surgery.   Hospital Course: Mr. Smoker arrived at [facility] and was brought to the operating room on 05/18/23. He underwent aortic valve replacement utilizing a 23mm Inspiris Resilia bioprosthetic aortic valve and ascending aortic aneurysm replacement utilizing a 84mmx10mm Hemashield Platinum Graft under circulatory arrest. He tolerated the procedure well and was transferred to the SICU in stable condition.  Postoperative hospital course:  The patient was extubated using standard post cardiac surgical protocols without difficulty.  He has remained hemodynamically stable.  He does have expected postoperative volume overload but is responding well to diuretics.  His Foley was kept in place until postop day 2 due to hematuria associated with traumatic catheterization in the setting of known BPH.  Chest tubes were removed on postoperative day #2 as well as were epicardial pacing wires.  He does have an expected acute blood loss anemia which is being monitored.  He has been started on iron supplementation.  He has maintained sinus rhythm with occasional PVCs.  He does have a postoperative thrombocytopenia which is being monitored with labs as well as clinically.  Aspirin was decreased to 81 mg due to this. He had a mild reactive leukocytosis which is trending improved over time. He was noted to have right hand weakness at 23:30 on 07/03, rapid response was called. He then converted to atrial fibrillation with RVR and became hypotensive. He was given an Amiodarone bolus and drip and a NS bolus and cardiology was consulted. Cardiology recommended transitioning to Amiodarone 200mg  BID the following morning and a 30 day monitor at discharge. He  converted to NSR the morning of 07/04. He was noted to have some weakness on his right side. CT head was ordered and showed no evidence of acute infarct or hemorrhage, brain MRI was ordered. 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 Neurology was consulted.  Exam at the time of their evaluation was notable for right hand weakness and mild right leg weakness.  He was not a candidate for thrombolysis or thrombectomy due to recent surgery.  Etiology was felt to be likely embolic in the setting of recent surgery and postoperative atrial fibrillation.  He was felt to be a candidate for Coumadin due to the recent new valve as well as postoperative atrial fibrillation and this has been initiated.  PT/OT/SLP consultations were obtained.  CT angio of the head and neck has also been ordered and showed no large vessel occlusion.  He did have some moderate bilateral paraclinoid ICA stenosis.  His INR is 3.6 the am of 07.08. He will likely need 1-2 mg of Coumadin with a goal INR of 2.0.  He has been evaluated by PT/OT who recommends [name] and the patient is agreeable.  He is maintaining NSR on oral Amiodarone and low  dose Lopressor. Per d/w Dr. Jacques Navy will continue Amiodarone 400 mg BID through 7/9 then decrease to 200 mg dayil on 07/10. We will ask [name] to remove chest tube sutures on 07/12. As discussed with Dr. Laneta Simmers, patient is felt stable for discharge to [name] today.   Consults: neurology  Significant Diagnostic Studies:   VAS [place] LOWER EXTREMITY VENOUS (DVT)  Result Date: 05/23/2023  Lower Venous DVT Study Patient Name:  SAYQUAN DURNAN  Date of Exam:   05/23/2023 Medical Rec #: [id number]       Accession #:    [id number] Date of Birth: [birth year]      Patient Gender: M Patient Age:   10 years Exam Location:  [facility] Procedure:      VAS [place] LOWER EXTREMITY VENOUS (DVT) Referring Phys: Scheryl Marten XU  --------------------------------------------------------------------------------  Indications: Stroke.  Comparison Study: No prior study Performing Technologist: Sherren Kerns RVS  Examination Guidelines: A complete evaluation includes B-mode imaging, spectral Doppler, color Doppler, and power Doppler as needed of all accessible portions of each vessel. Bilateral testing is considered an integral part of a complete examination. Limited examinations for reoccurring indications may be performed as noted. The reflux portion of the exam is performed with the patient in reverse Trendelenburg.  +---------+---------------+---------+-----------+----------+--------------+ RIGHT    CompressibilityPhasicitySpontaneityPropertiesThrombus Aging +---------+---------------+---------+-----------+----------+--------------+ CFV      Full           Yes      Yes                                 +---------+---------------+---------+-----------+----------+--------------+ SFJ      Full                                                        +---------+---------------+---------+-----------+----------+--------------+ FV Prox  Full                                                        +---------+---------------+---------+-----------+----------+--------------+ FV Mid   Full                                                        +---------+---------------+---------+-----------+----------+--------------+ FV DistalFull                                                        +---------+---------------+---------+-----------+----------+--------------+ PFV      Full                                                        +---------+---------------+---------+-----------+----------+--------------+ POP      Full  Yes      Yes                                 +---------+---------------+---------+-----------+----------+--------------+ PTV      Full                                                         +---------+---------------+---------+-----------+----------+--------------+ PERO     Full                                                        +---------+---------------+---------+-----------+----------+--------------+   +---------+---------------+---------+-----------+----------+--------------+ LEFT     CompressibilityPhasicitySpontaneityPropertiesThrombus Aging +---------+---------------+---------+-----------+----------+--------------+ CFV      Full           Yes      Yes                                 +---------+---------------+---------+-----------+----------+--------------+ SFJ      Full                                                        +---------+---------------+---------+-----------+----------+--------------+ FV Prox  Full                                                        +---------+---------------+---------+-----------+----------+--------------+ FV Mid   Full                                                        +---------+---------------+---------+-----------+----------+--------------+ FV DistalFull                                                        +---------+---------------+---------+-----------+----------+--------------+ PFV      Full                                                        +---------+---------------+---------+-----------+----------+--------------+ POP      Full           Yes      Yes                                 +---------+---------------+---------+-----------+----------+--------------+ PTV  Full                                                        +---------+---------------+---------+-----------+----------+--------------+ PERO     Full                                                        +---------+---------------+---------+-----------+----------+--------------+     Summary: BILATERAL: - No evidence of deep vein thrombosis seen in the lower extremities, bilaterally.  -No evidence of popliteal cyst, bilaterally.   *See table(s) above for measurements and observations. Electronically signed by Lemar Livings MD on 05/23/2023 at 3:51:24 PM.    Final    CT ANGIO HEAD NECK W WO CM  Result Date: 05/22/2023 CLINICAL DATA:  Neuro deficit, acute, stroke suspected EXAM: CT ANGIOGRAPHY HEAD AND NECK WITH AND WITHOUT CONTRAST TECHNIQUE: Multidetector CT imaging of the head and neck was performed using the standard protocol during bolus administration of intravenous contrast. Multiplanar CT image reconstructions and MIPs were obtained to evaluate the vascular anatomy. Carotid stenosis measurements (when applicable) are obtained utilizing NASCET criteria, using the distal internal carotid diameter as the denominator. RADIATION DOSE REDUCTION: This exam was performed according to the departmental dose-optimization program which includes automated exposure control, adjustment of the mA and/or kV according to patient size and/or use of iterative reconstruction technique. CONTRAST:  75mL OMNIPAQUE IOHEXOL 350 MG/ML SOLN COMPARISON:  MRI head 05/21/2023. FINDINGS: CT HEAD FINDINGS Brain: Known small acute infarcts better characterized on recent MRI head. No evidence of progressive mass effect or acute hemorrhage. No midline shift or hydrocephalus. No mass lesion. Vascular: See below. Skull: No acute fracture. Sinuses/Orbits: Inferior right greater left maxillary sinus mucosal thickening. No acute orbital findings. Other: No mastoid effusions. Review of the MIP images confirms the above findings CTA NECK FINDINGS Aortic arch: Great vessel origins are patent.  Atherosclerosis. Right carotid system: Atherosclerosis at the carotid bifurcation without greater than 50% stenosis. Left carotid system: Atherosclerosis at the carotid bifurcation without greater than 50% stenosis. Vertebral arteries: The vertebral arteries are patent bilaterally. Left dominant. Moderate left vertebral artery origin stenosis.  Skeleton: No acute abnormality on limited assessment. Solid C4-C5 ACDF. Other neck: No acute abnormality on limited assessment. Upper chest: Small partially imaged layering pleural effusions bilaterally. Lung apices are otherwise clear. Review of the MIP images confirms the above findings CTA HEAD FINDINGS Anterior circulation: Bilateral intracranial ICAs MCAs and ACAs are patent. Moderate bilateral paraclinoid ICA stenosis due to atherosclerosis. No aneurysm identified. Posterior circulation: Bilateral intradural vertebral arteries are patent. The right non dominant/small intradural vertebral artery largely terminates as PICA, anatomic variant. Basilar artery and bilateral posterior cerebral arteries are patent without proximal hemodynamically significant stenosis. Venous sinuses: As permitted by contrast timing, patent. Anatomic variants: Detailed above. Review of the MIP images confirms the above findings IMPRESSION: 1. No large vessel occlusion. 2. Moderate bilateral paraclinoid ICA stenosis. 3. Moder no large ate left vertebral artery origin stenosis. Electronically Signed   By: Feliberto Harts M.D.   On: 05/22/2023 16:11   ECHO INTRAOPERATIVE TEE  Result Date: 05/22/2023  *INTRAOPERATIVE TRANSESOPHAGEAL REPORT *  Patient Name:   JERVON WARMKESSEL  Date of Exam: 05/18/2023 Medical Rec #:  [id number]      Height:       68.0 in Accession #:    [id number]     Weight:       170.0 lb Date of Birth:  [birth year]     BSA:          1.91 m Patient Age:    66 years       BP:           ./. mmHg Patient Gender: M              HR:           57 bpm. Exam Location:  Anesthesiology Transesophogeal exam was perform intraoperatively during surgical procedure. Patient was closely monitored under general anesthesia during the entirety of examination. Indications:     I35.0 Performing Phys: [id number] Alleen Borne Diagnosing Phys: Val Eagle MD Complications: No known complications during this procedure. POST-OP IMPRESSIONS _ Left  Ventricle: has mildly reduced systolic function, with an ejection fraction of 50%. The cavity size was normal. The wall motion is normal. Moderate concentric LV hypertrophy (1.2cm). _ Right Ventricle: mildly reduced function. The cavity was mildly dilated. The wall motion is normal. _ Aorta: there is no dissection present in the aorta A graft was placed in the ascending aorta for repair. _ Aortic Valve: A bioprosthetic bioprosthetic valve was placed, leaflets are freely mobile and leaflets thin. No regurgitation post repair. The gradient recorded across the prosthetic valve is within the expected range, measuring 4 cm/s. No perivalvular leak noted. _ Mitral Valve: The mitral valve appears unchanged from pre-bypass. _ Tricuspid Valve: There is moderate regurgitation. PRE-OP FINDINGS  Left Ventricle: The left ventricle has not been assessed. The cavity size was left ventricular size was not assessed. Right Ventricle: The right ventricle has normal systolic function. The cavity was dialated. There is no increase in right ventricular wall thickness. Left Atrium: Left atrial size was not assessed. No left atrial/left atrial appendage thrombus was detected. Right Atrium: Right atrial size was not assessed. Interatrial Septum: Evidence of atrial level shunting detected by color flow Doppler. A small patent foramen ovale is detected with predominantly left to right shunting across the atrial septum. Pericardium: The pericardium was not assessed. Mitral Valve: The mitral valve is normal in structure. Mitral valve regurgitation is trivial by color flow Doppler. The MR jet is posteriorly-directed. There is No evidence of mitral stenosis. There is mild thickening and mild calcification present on the mitral valve anterior and posterior cusps with normal mobility. Tricuspid Valve: The tricuspid valve was dilated in appearance. Tricuspid valve regurgitation is mild by color flow Doppler. No evidence of tricuspid stenosis is  present. Aortic Valve: The aortic valve is bicuspid Aortic valve regurgitation was not visualized by color flow Doppler. There is present by color Doppler of the aortic valve, with a calculated valve area of 2.28 cm. Pulmonic Valve: The pulmonic valve was normal in structure, with normal. No evidence of pumonic stenosis. Pulmonic valve regurgitation is not visualized by color flow Doppler. Aorta: There is moderate dilatation of the ascending aorta, measuring 49 mm. There is evidence of a dissection in the none. +--------------+--------++ LEFT VENTRICLE         +--------------+--------++ PLAX 2D                +--------------+--------++ LVOT diam:    2.05 cm  +--------------+--------++ LVOT Area:    3.30 cm +--------------+--------++                        +--------------+--------++ +------------------+-----------++  AORTIC VALVE                  +------------------+-----------++ AV Area (Vmax):   2.26 cm    +------------------+-----------++ AV Area (Vmean):  2.05 cm    +------------------+-----------++ AV Area (VTI):    2.28 cm    +------------------+-----------++ AV Vmax:          144.00 cm/s +------------------+-----------++ AV Vmean:         97.200 cm/s +------------------+-----------++ AV VTI:           0.203 m     +------------------+-----------++ AV Peak Grad:     8.3 mmHg    +------------------+-----------++ AV Mean Grad:     4.0 mmHg    +------------------+-----------++ LVOT Vmax:        98.80 cm/s  +------------------+-----------++ LVOT Vmean:       60.500 cm/s +------------------+-----------++ LVOT VTI:         0.140 m     +------------------+-----------++ LVOT/AV VTI ratio:0.69        +------------------+-----------++  +--------------+-------++ AORTA                 +--------------+-------++ Ao Sinus diam:3.60 cm +--------------+-------++ Ao STJ diam:  3.0 cm  +--------------+-------++ Ao Asc diam:  4.90 cm  +--------------+-------++ +-------------+---------++ MITRAL VALVE           +--------------+-------+ +-------------+---------++ SHUNTS                MV Peak grad:3.6 mmHg  +--------------+-------+ +-------------+---------++ Systemic VTI: 0.14 m  MV Mean grad:1.0 mmHg  +--------------+-------+ +-------------+---------++ Systemic Diam:2.05 cm MV Vmax:     0.95 m/s  +--------------+-------+ +-------------+---------++ MV Vmean:    44.5 cm/s +-------------+---------++ MV VTI:      0.28 m    +-------------+---------++  Val Eagle MD Electronically signed by Val Eagle MD Signature Date/Time: 05/22/2023/11:19:27 AM    Final    MR BRAIN WO CONTRAST  Result Date: 05/21/2023 CLINICAL DATA:  Stroke suspected. Status post aortic valve replacement. EXAM: MRI HEAD WITHOUT CONTRAST TECHNIQUE: Multiplanar, multiecho pulse sequences of the brain and surrounding structures were obtained without intravenous contrast. COMPARISON:  Same-day CT head FINDINGS: Brain: Are numerous small foci of diffusion restriction in the left cerebellar hemisphere and both cerebral hemispheres in the MCA and PCA distributions consistent with small acute infarcts, likely embolic in etiology. There is no associated hemorrhage or mass effect. There is no acute intracranial hemorrhage or extra-axial fluid collection Background parenchymal volume is normal. The ventricles are normal in size. There is a minimal background chronic small-vessel ischemic change. There are scattered punctate foci of SWI signal dropout which are nonspecific but may be related to prior emboli. The pituitary and suprasellar region are normal. There is no mass lesion. There is no mass effect or midline shift. Vascular: Normal flow voids. Skull and upper cervical spine: Normal marrow signal. Sinuses/Orbits: There is mild mucosal thickening in the paranasal sinuses. The globes and orbits are unremarkable. Other: The mastoid air cells and  middle ear cavities are clear. IMPRESSION: Scattered small acute infarcts in the left cerebellar hemisphere and both cerebral hemispheres, likely embolic in etiology. Electronically Signed   By: Lesia Hausen M.D.   On: 05/21/2023 16:53   CT HEAD WO CONTRAST ( )  Result Date: 05/21/2023 CLINICAL DATA:  Neuro deficit, acute, stroke suspected EXAM: CT HEAD WITHOUT CONTRAST TECHNIQUE: Contiguous axial images were obtained from the base of the skull through the vertex without intravenous contrast. RADIATION DOSE REDUCTION: This exam was performed according  to the departmental dose-optimization program which includes automated exposure control, adjustment of the mA and/or kV according to patient size and/or use of iterative reconstruction technique. COMPARISON:  None Available. FINDINGS: Brain: No evidence of acute infarction, hemorrhage, hydrocephalus, extra-axial collection or mass lesion/mass effect. Vascular: No hyperdense vessel or unexpected calcification. Skull: Normal. Negative for fracture or focal lesion. Sinuses/Orbits: No middle ear or mastoid effusion. Mucosal thickening right maxillary sinus. Orbits are unremarkable. Other: None. IMPRESSION: No hemorrhage or CT evidence of an acute cortical infarct. If there is high clinical concern, further evaluation with brain MRI could considered. Electronically Signed   By: Lorenza Cambridge M.D.   On: 05/21/2023 10:39   DG Chest Port 1 View  Result Date: 05/20/2023 CLINICAL DATA:  [id number] S/P AVR [id number] EXAM: PORTABLE CHEST 1 VIEW COMPARISON:  CXR 05/19/23 FINDINGS: Status post median sternotomy and aortic valve repair. Epicardial pacing leads in place. Right-sided central venous catheter sheath remains. Interval removal of the PA catheter. Mediastinal drain in place. Partially visualized cervical spinal fusion is redemonstrated. No pleural effusion. No pneumothorax. Unchanged cardiac and mediastinal contours. Left basilar atelectasis. No radiographically apparent  displaced rib fractures. Visualized upper abdomen is unremarkable IMPRESSION: Left basilar atelectasis, improved from prior. Electronically Signed   By: Lorenza Cambridge M.D.   On: 05/20/2023 09:02   DG Chest 2 View  Result Date: 05/19/2023 CLINICAL DATA:  Aortic valve stenosis.  Preoperative evaluation. EXAM: CHEST - 2 VIEW COMPARISON:  Radiographs 04/08/2022.  Cardiac CT 04/14/2023. FINDINGS: The heart size and mediastinal contours are stable with unchanged dilatation of the ascending aorta. The lungs are clear. There is no pleural effusion or pneumothorax. No acute osseous findings are evident status post lower cervical fusion. IMPRESSION: No acute cardiopulmonary process. Stable dilatation of the ascending aorta. Electronically Signed   By: Carey Bullocks M.D.   On: 05/19/2023 09:57   DG Chest Port 1 View  Result Date: 05/19/2023 CLINICAL DATA:  Postop aortic valve replacement and ascending aortic repair. EXAM: PORTABLE CHEST 1 VIEW COMPARISON:  Radiographs 05/18/2023 and 05/14/2023. Cardiac CT 04/14/2023. FINDINGS: 0540 hours. Interval extubation and removal of the enteric tube. Mediastinal drains and right IJ Swan-Ganz catheter are unchanged in position. The heart size and mediastinal contours are stable status post recent median sternotomy and aortic valve replacement. There is mildly increased left basilar atelectasis with a probable small left pleural effusion. No evidence of pneumothorax. The bones appear unchanged. Telemetry leads overlie the chest. IMPRESSION: Interval extubation with mildly increased left basilar atelectasis and probable small left pleural effusion. No pneumothorax. Electronically Signed   By: Carey Bullocks M.D.   On: 05/19/2023 09:55   DG Chest Port 1 View  Result Date: 05/18/2023 CLINICAL DATA:  Status post aortic valve replacement. EXAM: PORTABLE CHEST 1 VIEW COMPARISON:  05/14/2023 FINDINGS: Endotracheal tube tip is 7.7 cm above the base of the carina. The NG tube passes  into the stomach although the distal tip position is not included on the film. Right IJ pulmonary artery catheter tip is in the right main pulmonary artery. Midline pericardial/mediastinal drains evident. Telemetry leads overlie the chest. No evidence for pneumothorax. No focal consolidation or substantial pleural effusion. There is probably some trace atelectasis at the left base. IMPRESSION: 1. Support apparatus as described without pneumothorax. 2. Probable trace atelectasis at the left base. Electronically Signed   By: Kennith Center M.D.   On: 05/18/2023 15:01   EP STUDY  Result Date: 05/18/2023 See surgical note for result.  VAS  US CAROTID  Result Date: 05/14/2023 Carotid Arterial Duplex Study Patient Name:  DHEERAJ CHARNLEY  Date of Exam:   05/14/2023 Medical Rec #: [id number]       Accession #:    [id number] Date of Birth: August 30, 1956      Patient Gender: M Patient Age:   11 years Exam Location:  [facility] Procedure:      VAS US CAROTID Referring Phys: Evelene Croon --------------------------------------------------------------------------------  Indications:  Pre-op AVR. Risk Factors: Hypertension, hyperlipidemia, past history of smoking, coronary               artery disease. Performing Technologist: Jean Rosenthal RDMS, RVT  Examination Guidelines: A complete evaluation includes B-mode imaging, spectral Doppler, color Doppler, and power Doppler as needed of all accessible portions of each vessel. Bilateral testing is considered an integral part of a complete examination. Limited examinations for reoccurring indications may be performed as noted.  Right Carotid Findings: +----------+--------+--------+--------+------------------+--------+           PSV cm/sEDV cm/sStenosisPlaque DescriptionComments +----------+--------+--------+--------+------------------+--------+ CCA Prox  75      25                                          +----------+--------+--------+--------+------------------+--------+ CCA Distal55      22                                         +----------+--------+--------+--------+------------------+--------+ ICA Prox  42      16      1-39%   heterogenous               +----------+--------+--------+--------+------------------+--------+ ICA Distal56      26                                         +----------+--------+--------+--------+------------------+--------+ ECA       52      14                                         +----------+--------+--------+--------+------------------+--------+ +----------+--------+-------+----------------+-------------------+           PSV cm/sEDV cmsDescribe        Arm Pressure (mmHG) +----------+--------+-------+----------------+-------------------+ [id number]             Multiphasic, WNL                    +----------+--------+-------+----------------+-------------------+ +---------+--------+--+--------+-+---------+ VertebralPSV cm/s26EDV cm/s8Antegrade +---------+--------+--+--------+-+---------+  Left Carotid Findings: +----------+--------+--------+--------+------------------+------------------+           PSV cm/sEDV cm/sStenosisPlaque DescriptionComments           +----------+--------+--------+--------+------------------+------------------+ CCA Prox  54      18                                                   +----------+--------+--------+--------+------------------+------------------+ CCA Distal56      20                                                   +----------+--------+--------+--------+------------------+------------------+  ICA Prox  29      11                                intimal thickening +----------+--------+--------+--------+------------------+------------------+ ICA Distal47      24                                                    +----------+--------+--------+--------+------------------+------------------+ ECA       60      20                                                   +----------+--------+--------+--------+------------------+------------------+ +----------+--------+--------+----------------+-------------------+           PSV cm/sEDV cm/sDescribe        Arm Pressure (mmHG) +----------+--------+--------+----------------+-------------------+ [id number]              Multiphasic, WNL                    +----------+--------+--------+----------------+-------------------+ +---------+--------+--+--------+--+---------+ VertebralPSV cm/s36EDV cm/s15Antegrade +---------+--------+--+--------+--+---------+   Summary: Right Carotid: Velocities in the right ICA are consistent with a 1-39% stenosis. Left Carotid: The extracranial vessels were near-normal with only minimal wall               thickening or plaque. Vertebrals:  Bilateral vertebral arteries demonstrate antegrade flow. Subclavians: Normal flow hemodynamics were seen in bilateral subclavian              arteries. *See table(s) above for measurements and observations.  Electronically signed by Gerarda Fraction on 05/14/2023 at 7:39:37 PM.    Final       Results for orders placed or performed during the hospital encounter of 05/18/23 (from the past 48 hour(s))  Protime-INR     Status: Abnormal   Collection Time: 05/24/23  1:28 AM  Result Value Ref Range   Prothrombin Time 28.1 (H) 11.4 - 15.2 seconds   INR 2.6 (H) 0.8 - 1.2    Comment: (NOTE) INR goal varies based on device and disease states. Performed at [facility], [address], [place], [place] [address]   Protime-INR     Status: Abnormal   Collection Time: 05/25/23  1:27 AM  Result Value Ref Range   Prothrombin Time 36.4 (H) 11.4 - 15.2 seconds   INR 3.6 (H) 0.8 - 1.2    Comment: (NOTE) INR goal varies based on device and disease states. Performed at [facility], [address], [place], [place] [address]     Treatments: surgery:  05/18/2023   Surgeon:  Alleen Borne, MD   First Assistant: Aloha Gell,  PA-C : An experienced assistant was required given the complexity of this surgery and the standard of surgical care. The assistant was needed for exposure, dissection, suctioning, retraction of delicate tissues and sutures, instrument exchange and for overall help during this procedure.      Preoperative Diagnosis:  Bicuspid aortic valve stenosis with 5.1 cm ascending aortic aneurysm     Postoperative Diagnosis:  Same     Procedure:   Median Sternotomy Extracorporeal circulation 3.   Replacement of ascending aortic aneurysm (Hemi-arch)  under deep hypothermic circulatory arrest, retrograde cerebral perfusion. 4.   Aortic valve replacement using a 23 mm INSPIRIS RESILIA pericardial valve.   Anesthesia:  General Endotracheal  Discharge Exam: Blood pressure 112/78, pulse 70, temperature 98.2 F (36.8 C), temperature source Oral, resp. rate 17, height 5\' 8"  (1.727 m), weight 75.1 kg, SpO2 100 %. Cardiovascular: RRR, no murmur Pulmonary: Clear to auscultation bilaterally Abdomen: Soft, non tender, bowel sounds present. Extremities: No lower extremity edema. Wound: Clean and dry.  No erythema or signs of infection.   Discharge Medications:  The patient has been discharged on:   1.Beta Blocker:  Yes [ X  ]                              No   [   ]                              If No, reason:  2.Ace Inhibitor/ARB: Yes [   ]                                     No  [ X   ]                                     If No, reason: stroke, need to avoid hypotension  3.Statin:   Yes [  x ]                  No  [   ]                  If No, reason:  4.Ecasa:  Yes  [  x ]                  No   [   ]                  If No, reason:  Patient had ACS upon admission: No  Plavix/P2Y12 inhibitor: Yes [   ]                                      No  [ x  ]     Discharge Instructions     Amb Referral to Cardiac Rehabilitation   Complete by: As directed    Diagnosis: Valve Replacement   Valve: Aortic   After initial evaluation and assessments completed: Virtual Based Care may be provided alone or in conjunction with Phase 2 Cardiac Rehab based on patient barriers.: Yes   Intensive Cardiac Rehabilitation (ICR) [facility] location only OR Traditional Cardiac Rehabilitation (TCR) *If criteria for ICR are not met will enroll in TCR [facility] only): Yes   Ambulatory referral to Neurology   Complete by: As directed    Follow up with Dr. Epimenio Foot at [facility] in 4 weeks.  Patient is Dr. Ursula Alert patient. Thanks.      Allergies as of 05/25/2023       Reactions   Alcohol Other (See Comments)   Pts skin gets red. Ex. Pt used alcohol based deodorant and got red things under arms. & Drinks alcohol notices "  from heart on up" everything turns red.   Chlorthalidone    Dizziness and Syncopal Episode        Medication List     STOP taking these medications    amLODipine 5 MG tablet Commonly known as: NORVASC   losartan 100 MG tablet Commonly known as: COZAAR       TAKE these medications    albuterol 108 (90 Base) MCG/ACT inhaler Commonly known as: VENTOLIN HFA Inhale 2 puffs into the lungs 2 (two) times daily.   amiodarone 400 MG tablet Commonly known as: PACERONE Take 400 mg bid for tow days; then take 200 mg daily starting 07/10 thereafter   aspirin EC 81 MG tablet Take 1 tablet (81 mg total) by mouth daily.   atorvastatin 80 MG tablet Commonly known as: LIPITOR Take 1 tablet (80 mg total) by mouth daily.   baclofen 10 MG tablet Commonly known as: LIORESAL Take 1 tablet (10 mg total) by mouth 3 (three) times daily. What changed: when to take this   Fe Fum-Vit C-Vit B12-FA Caps capsule Commonly known as: TRIGELS-F FORTE Take 1 capsule by mouth daily after breakfast.   finasteride 5 MG tablet Commonly known as: PROSCAR Take 5 mg by  mouth daily.   metoprolol succinate 25 MG 24 hr tablet Commonly known as: TOPROL-XL TAKE 1 TABLET ONCE DAILY.   nutrition supplement (JUVEN) Pack Take 1 packet by mouth 2 (two) times daily between meals.   oxyCODONE 5 MG immediate release tablet Commonly known as: Oxy IR/ROXICODONE Take 1 tablet (5 mg total) by mouth every 6 (six) hours as needed for severe pain.         Follow-up Information     Alleen Borne, MD. Go on 07/01/2023.   Specialty: Cardiothoracic Surgery Why: Appointment time is at 12:30 pm Contact information: [address] [place] [address] [phone]         [facility] .   Specialty: Cardiothoracic Surgery Contact information: [address] [place], [address] [place] [address] [phone]        [facility]. Go on 07/01/2023.   Why: Please arrive by 11:15 am in order to have PA/LAT CXR taken PRIOR to office appointment with Dr. Cristy Hilts information: [address] [place] [address]        Asa Lente, MD. Schedule an appointment as soon as possible for a visit in 1 month(s).   Specialty: Neurology Contact information: [address] [place] [address] [phone]         Sharlene Dory, NP. Go on 06/09/2023.   Specialty: Cardiology Why: Appointment time is at 2:30 pm Contact information: [address] [place] [address] [phone]         [facility]. Go on 07/01/2023.   Why: Echocardiogram/Heart ultrasound - Appointment time is at 8:00 am Contact information: [address] [place] [address]        [facility]. Call.   Why: for a follow up appointment regarding further surveillance of HGA1C 5.8 (pre diabetes) Contact information: [address] [place] [address] [phone]                 Signed:  Ardelle Balls, PA-C 05/25/2023, 10:49 AM

## 2023-05-21 ENCOUNTER — Inpatient Hospital Stay (HOSPITAL_COMMUNITY): Payer: Medicare Other

## 2023-05-21 DIAGNOSIS — I480 Paroxysmal atrial fibrillation: Secondary | ICD-10-CM

## 2023-05-21 DIAGNOSIS — Z952 Presence of prosthetic heart valve: Secondary | ICD-10-CM

## 2023-05-21 LAB — BASIC METABOLIC PANEL
Anion gap: 7 (ref 5–15)
BUN: 26 mg/dL — ABNORMAL HIGH (ref 8–23)
CO2: 24 mmol/L (ref 22–32)
Calcium: 8.1 mg/dL — ABNORMAL LOW (ref 8.9–10.3)
Chloride: 104 mmol/L (ref 98–111)
Creatinine, Ser: 0.98 mg/dL (ref 0.61–1.24)
GFR, Estimated: >60 mL/min (ref >60)
Glucose, Bld: 141 mg/dL — ABNORMAL HIGH (ref 70–99)
Potassium: 3.8 mmol/L (ref 3.5–5.1)
Sodium: 135 mmol/L (ref 135–145)

## 2023-05-21 LAB — CBC
HCT: 24.4 % — ABNORMAL LOW (ref 39.0–52.0)
Hemoglobin: 8.2 g/dL — ABNORMAL LOW (ref 13.0–17.0)
MCH: 30.9 pg (ref 26.0–34.0)
MCHC: 33.6 g/dL (ref 30.0–36.0)
MCV: 92.1 fL (ref 80.0–100.0)
Platelets: 100 10*3/uL — ABNORMAL LOW (ref 150–400)
RBC: 2.65 MIL/uL — ABNORMAL LOW (ref 4.22–5.81)
RDW: 14.3 % (ref 11.5–15.5)
WBC: 10.8 10*3/uL — ABNORMAL HIGH (ref 4.0–10.5)
nRBC: 0 % (ref 0.0–0.2)

## 2023-05-21 MED ORDER — AMIODARONE LOAD VIA INFUSION
150.0000 mg | Freq: Once | INTRAVENOUS | Status: AC
  Administered 2023-05-21: 150 mg via INTRAVENOUS
  Filled 2023-05-21: qty 83.34

## 2023-05-21 MED ORDER — AMIODARONE HCL IN DEXTROSE 360-4.14 MG/200ML-% IV SOLN
30.0000 mg/h | INTRAVENOUS | Status: DC
  Administered 2023-05-21 – 2023-05-22 (×3): 30 mg/h via INTRAVENOUS
  Filled 2023-05-21 (×2): qty 200

## 2023-05-21 MED ORDER — SODIUM CHLORIDE 0.9 % IV BOLUS
500.0000 mL | Freq: Once | INTRAVENOUS | Status: AC | PRN
  Administered 2023-05-21: 500 mL via INTRAVENOUS

## 2023-05-21 MED ORDER — JUVEN PO PACK
1.0000 | PACK | Freq: Two times a day (BID) | ORAL | Status: DC
  Administered 2023-05-22 – 2023-05-25 (×6): 1 via ORAL
  Filled 2023-05-21 (×6): qty 1

## 2023-05-21 MED ORDER — AMIODARONE HCL IN DEXTROSE 360-4.14 MG/200ML-% IV SOLN
60.0000 mg/h | INTRAVENOUS | Status: AC
  Administered 2023-05-21 (×2): 60 mg/h via INTRAVENOUS
  Filled 2023-05-21 (×2): qty 200

## 2023-05-21 NOTE — Care Management Important Message (Signed)
Important Message  Patient Details  Name: [name] MRN: [id number] Date of Birth: October 10, 1956   Medicare Important Message Given:  Yes     Renie Ora 05/21/2023, 8:43 AM

## 2023-05-21 NOTE — Progress Notes (Signed)
Patient out of bed to use bedside commode.  This nurse noticed significant weakness in right leg as well as right upper extremity.  Right foot was noticed dragging the floor as the patient attempted to ambulate.  PA on call with cardio/thoracic notified.

## 2023-05-21 NOTE — Progress Notes (Addendum)
Per Dr. Dorris Fetch, if HR does not resolve with amio gtt, this RN is to call cardiology to come see the  patient.  Cardiology paged. Awaiting call back

## 2023-05-21 NOTE — Progress Notes (Signed)
Notified by CCMD patient had 14 beat run non-sustain vtach/wide QRS? Patient asymptomatic. VSS. Patient back in NSR  Will continue to monitor

## 2023-05-21 NOTE — Progress Notes (Signed)
Patient informed this RN that he was unable to grasp anything with his right hand. Patient not sure when this started. Confirmed with 2H RN that patient had a strong grip and was able to hold his cup of pills @2330 .   Checked grip strength of both hands and left was definitely stronger. Right was very weak, cool to touch. Positive Radial pulse confirmed with doppler.   Called rapid to bedside to assess.

## 2023-05-21 NOTE — Consult Note (Signed)
CARDIOLOGY CONSULT NOTE       Patient ID: [name] MRN: [id number] DOB/AGE: [birth year] 67 y.o.  Admit date: 05/18/2023 Referring Physician: Dorris Fetch Primary Physician: [facility] Primary Cardiologist: Diona Browner Reason for Consultation: PAF  Principal Problem:   S/P AVR (aortic valve replacement)   HPI:  67 y.o. post AVR with ascending root replacement for severe AS. 5 Pre operative cath with CTO RCA with right to right collaterals No bypass grafting done at time of AVR/root replacement 05/18/23. Has been anemic postoperatively Hb 8.2. This am had rapid PAF with some ST depression in lateral leads and hypotension. Eventually seen by fellow and started on amiodarone. Currently converted to NSR rates 70-80 eating breakfast with no symptoms He has no contraindications to anticoagulation CHADVASC 3. Duration of PAF about 1:45 minutes.  ROS All other systems reviewed and negative except as noted above  Past Medical History:  Diagnosis Date   Arthritis    Coronary atherosclerosis of native coronary artery    a. NSTEMI (02/2014):  LHC (02/2014):  Mild disease in LAD and CFX; prox RCA occluded with R-R collats, dist RCA filled by L-R collats, inf HK, EF 55%, LVEDP 15 mmHg.  PCI:  Unsuccessful angioplasty of RCA (late presentation of inf MI) - tx medically.   Essential hypertension    GERD (gastroesophageal reflux disease)    Hyperlipidemia    NSTEMI (non-ST elevated myocardial infarction) (HCC) 03/11/14   Renal cell carcinoma of right kidney (HCC)    Partial nephrectomy in 2013    Family History  Problem Relation Age of Onset   Renal Disease Daughter    Hypertension Daughter     Social History   Socioeconomic History   Marital status: Married    Spouse name: Not on file   Number of children: 1   Years of education: 14   Highest education level: Not on file  Occupational History   Occupation: Retired   Tobacco Use   Smoking status: Former    Types:  Cigarettes    Quit date: 11/18/1979    Years since quitting: 43.5   Smokeless tobacco: Never  Vaping Use   Vaping Use: Never used  Substance and Sexual Activity   Alcohol use: No    Alcohol/week: 0.0 standard drinks of alcohol   Drug use: Yes    Frequency: 7.0 times per week    Types: Marijuana    Comment: every day   Sexual activity: Not on file  Other Topics Concern   Not on file  Social History Narrative   Lives w/ wife   Caffeine use: 1 cup coffee every morning   Right handed   Social Determinants of Health   Financial Resource Strain: Not on file  Food Insecurity: No Food Insecurity (05/18/2023)   Hunger Vital Sign    Worried About Running Out of Food in the Last Year: Never true    Ran Out of Food in the Last Year: Never true  Transportation Needs: No Transportation Needs (05/18/2023)   PRAPARE - Administrator, Civil Service (Medical): No    Lack of Transportation (Non-Medical): No  Physical Activity: Not on file  Stress: Not on file  Social Connections: Not on file  Intimate Partner Violence: Not At Risk (05/18/2023)   Humiliation, Afraid, Rape, and Kick questionnaire    Fear of Current or Ex-Partner: No    Emotionally Abused: No    Physically Abused: No    Sexually Abused: No  Past Surgical History:  Procedure Laterality Date   AORTIC VALVE REPLACEMENT N/A 05/18/2023   Procedure: AORTIC VALVE REPLACEMENT (AVR) USING INSPIRIS RESILIA 23 MM AORTIC VALVE;  Surgeon: Alleen Borne, MD;  Location: [facility] OR;  Service: Open Heart Surgery;  Laterality: N/A;   BACK SURGERY     1989   CERVICAL DISC SURGERY  04/29/2021   COLONOSCOPY N/A 12/11/2020   Procedure: COLONOSCOPY;  Surgeon: Franky Macho, MD;  Location: [facility];  Service: Gastroenterology;  Laterality: N/A;   LEFT HEART CATHETERIZATION WITH CORONARY ANGIOGRAM N/A 03/17/2014   Procedure: LEFT HEART CATHETERIZATION WITH CORONARY ANGIOGRAM;  Surgeon: Corky Crafts, MD;  Location: [facility] CATH LAB;   Service: Cardiovascular;  Laterality: N/A;   POLYPECTOMY  12/11/2020   Procedure: POLYPECTOMY;  Surgeon: Franky Macho, MD;  Location: [facility];  Service: Gastroenterology;;   REPLACEMENT ASCENDING AORTA N/A 05/18/2023   Procedure: REPLACEMENT OF ASCENDING AORTIC ANEURYSM USING A 30 X 10 MM HEMASHIELD PLATINUM GRAFT;  Surgeon: Alleen Borne, MD;  Location: [facility] OR;  Service: Open Heart Surgery;  Laterality: N/A;  CIRC ARREST   RIGHT HEART CATH AND CORONARY ANGIOGRAPHY N/A 04/03/2023   Procedure: RIGHT HEART CATH AND CORONARY ANGIOGRAPHY;  Surgeon: Tonny Bollman, MD;  Location: [facility] INVASIVE CV LAB;  Service: Cardiovascular;  Laterality: N/A;   ROBOT ASSISTED LAPAROSCOPIC NEPHRECTOMY  01/14/2012   Procedure: ROBOTIC ASSISTED LAPAROSCOPIC NEPHRECTOMY;  Surgeon: Milford Cage, MD;  Location: [facility] ORS;  Service: Urology;  Laterality: Right;  Robot Laparoscopic Right Partial Nephrectomy     TEE WITHOUT CARDIOVERSION N/A 05/18/2023   Procedure: TRANSESOPHAGEAL ECHOCARDIOGRAM;  Surgeon: Alleen Borne, MD;  Location: [facility] OR;  Service: Open Heart Surgery;  Laterality: N/A;   TONSILLECTOMY        Current Facility-Administered Medications:    0.9 %  sodium chloride infusion, 250 mL, Intravenous, PRN, Gold, Wayne E, PA-C   acetaminophen (TYLENOL) tablet 1,000 mg, 1,000 mg, Oral, Q6H, 1,000 mg at 05/20/23 2327 **OR** acetaminophen (TYLENOL) 160 MG/5ML solution 1,000 mg, 1,000 mg, Per Tube, Q6H, Gold, Wayne E, PA-C   [COMPLETED] amiodarone (NEXTERONE) 1.8 mg/mL load via infusion 150 mg, 150 mg, Intravenous, Once, 150 mg at 05/21/23 0431 **FOLLOWED BY** amiodarone (NEXTERONE PREMIX) 360-4.14 MG/200ML-% (1.8 mg/mL) IV infusion, 60 mg/hr, Intravenous, Continuous, Last Rate: 33.3 mL/hr at 05/21/23 0751, 60 mg/hr at 05/21/23 0751 **FOLLOWED BY** amiodarone (NEXTERONE PREMIX) 360-4.14 MG/200ML-% (1.8 mg/mL) IV infusion, 30 mg/hr, Intravenous, Continuous, Loreli Slot, MD   aspirin EC tablet 81 mg,  81 mg, Oral, Daily, 81 mg at 05/20/23 0945 **OR** aspirin chewable tablet 81 mg, 81 mg, Per Tube, Daily, Gold, Wayne E, PA-C   atorvastatin (LIPITOR) tablet 80 mg, 80 mg, Oral, Daily, Gold, Wayne E, PA-C, 80 mg at 05/20/23 0944   baclofen (LIORESAL) tablet 10 mg, 10 mg, Oral, Daily, Gold, Wayne E, PA-C, 10 mg at 05/20/23 0944   bisacodyl (DULCOLAX) EC tablet 10 mg, 10 mg, Oral, Daily, 10 mg at 05/20/23 0944 **OR** bisacodyl (DULCOLAX) suppository 10 mg, 10 mg, Rectal, Daily, Gold, Wayne E, PA-C   bisacodyl (DULCOLAX) EC tablet 10 mg, 10 mg, Oral, Daily PRN **OR** bisacodyl (DULCOLAX) suppository 10 mg, 10 mg, Rectal, Daily PRN, Gold, Wayne E, PA-C   Chlorhexidine Gluconate Cloth 2 % PADS 6 each, 6 each, Topical, Daily, Alleen Borne, MD, 6 each at 05/20/23 0956   Fe Fum-Vit C-Vit B12-FA (TRIGELS-F FORTE) capsule 1 capsule, 1 capsule, Oral, QPC breakfast, Gold, Wayne E, PA-C,  1 capsule at 05/20/23 0944   finasteride (PROSCAR) tablet 5 mg, 5 mg, Oral, Daily, Gold, Wayne E, PA-C, 5 mg at 05/20/23 0945   metoprolol tartrate (LOPRESSOR) tablet 12.5 mg, 12.5 mg, Oral, BID, 12.5 mg at 05/20/23 0944 **OR** metoprolol tartrate (LOPRESSOR) 25 mg/10 mL oral suspension 12.5 mg, 12.5 mg, Per Tube, BID, Gold, Wayne E, PA-C, 12.5 mg at 05/20/23 2124   metoprolol tartrate (LOPRESSOR) injection 2.5-5 mg, 2.5-5 mg, Intravenous, Q2H PRN, Gold, Wayne E, PA-C, 2.5 mg at 05/21/23 0617   ondansetron (ZOFRAN) injection 4 mg, 4 mg, Intravenous, Q6H PRN, Gold, Wayne E, PA-C, 4 mg at 05/19/23 4098   oxyCODONE (Oxy IR/ROXICODONE) immediate release tablet 5-10 mg, 5-10 mg, Oral, Q3H PRN, Gold, Wayne E, PA-C, 5 mg at 05/19/23 1620   pantoprazole (PROTONIX) EC tablet 40 mg, 40 mg, Oral, Daily, Gold, Wayne E, PA-C, 40 mg at 05/20/23 0945   potassium chloride (KLOR-CON M) CR tablet 10 mEq, 10 mEq, Oral, Daily, Stehler, Bailey C, PA-C, 10 mEq at 05/20/23 0945   sodium chloride flush (NS) 0.9 % injection 3 mL, 3 mL, Intravenous, Q12H,  Gold, Wayne E, PA-C, 3 mL at 05/20/23 2125   sodium chloride flush (NS) 0.9 % injection 3 mL, 3 mL, Intravenous, PRN, Gold, Wayne E, PA-C   traMADol (ULTRAM) tablet 50-100 mg, 50-100 mg, Oral, Q4H PRN, Gold, Wayne E, PA-C, 100 mg at 05/20/23 0945  acetaminophen  1,000 mg Oral Q6H   Or   acetaminophen (TYLENOL) oral liquid 160 mg/5 mL  1,000 mg Per Tube Q6H   aspirin EC  81 mg Oral Daily   Or   aspirin  81 mg Per Tube Daily   atorvastatin  80 mg Oral Daily   baclofen  10 mg Oral Daily   bisacodyl  10 mg Oral Daily   Or   bisacodyl  10 mg Rectal Daily   Chlorhexidine Gluconate Cloth  6 each Topical Daily   Fe Fum-Vit C-Vit B12-FA  1 capsule Oral QPC breakfast   finasteride  5 mg Oral Daily   metoprolol tartrate  12.5 mg Oral BID   Or   metoprolol tartrate  12.5 mg Per Tube BID   pantoprazole  40 mg Oral Daily   potassium chloride  10 mEq Oral Daily   sodium chloride flush  3 mL Intravenous Q12H    sodium chloride     amiodarone 60 mg/hr (05/21/23 0751)   Followed by   amiodarone      Physical Exam: Blood pressure 100/68, pulse 71, temperature 97.6 F (36.4 C), temperature source Oral, resp. rate 11, height 5\' 8"  (1.727 m), weight 79.7 kg, SpO2 99 %.   Affect appropriate Healthy:  appears stated age HEENT: normal Neck supple with no adenopathy JVP normal no bruits no thyromegaly Lungs clear with no wheezing and good diaphragmatic motion Heart:  S1/S2 SEM through AVR no AR sternum healing well Abdomen: benighn, BS positve, no tenderness, no AAA no bruit.  No HSM or HJR Distal pulses intact with no bruits No edema Neuro non-focal Skin warm and dry No muscular weakness   Labs:   Lab Results  Component Value Date   WBC 10.8 (H) 05/21/2023   HGB 8.2 (L) 05/21/2023   HCT 24.4 (L) 05/21/2023   MCV 92.1 05/21/2023   PLT 100 (L) 05/21/2023    Recent Labs  Lab 05/14/23 1400 05/18/23 0808 05/21/23 0551  NA 139   < > 135  K 3.8   < >  3.8  CL 105   < > 104  CO2 25    < > 24  BUN 16   < > 26*  CREATININE 1.09   < > 0.98  CALCIUM 9.1   < > 8.1*  PROT 6.4*  --   --   BILITOT 0.7  --   --   ALKPHOS 83  --   --   ALT 21  --   --   AST 18  --   --   GLUCOSE 118*   < > 141*   < > = values in this interval not displayed.   Lab Results  Component Value Date   CKTOTAL 152 03/17/2014   CKMB 2.8 03/17/2014   TROPONINI <0.30 04/25/2014    Lab Results  Component Value Date   CHOL 139 12/04/2022   CHOL 119 09/20/2020   CHOL 130 09/16/2018   Lab Results  Component Value Date   HDL 48 12/04/2022   HDL 43 09/20/2020   HDL 44 09/16/2018   Lab Results  Component Value Date   LDLCALC 75 12/04/2022   LDLCALC 59 09/20/2020   LDLCALC 71 09/16/2018   Lab Results  Component Value Date   TRIG 80 12/04/2022   TRIG 90 09/20/2020   TRIG 70 09/16/2018   Lab Results  Component Value Date   CHOLHDL 2.9 12/04/2022   CHOLHDL 2.8 09/20/2020   CHOLHDL 3.0 09/16/2018   No results found for: "LDLDIRECT"    Radiology: [facility] Chest Port 1 View  Result Date: 05/20/2023 CLINICAL DATA:  [id number] S/P AVR [id number] EXAM: PORTABLE CHEST 1 VIEW COMPARISON:  CXR 05/19/23 FINDINGS: Status post median sternotomy and aortic valve repair. Epicardial pacing leads in place. Right-sided central venous catheter sheath remains. Interval removal of the PA catheter. Mediastinal drain in place. Partially visualized cervical spinal fusion is redemonstrated. No pleural effusion. No pneumothorax. Unchanged cardiac and mediastinal contours. Left basilar atelectasis. No radiographically apparent displaced rib fractures. Visualized upper abdomen is unremarkable IMPRESSION: Left basilar atelectasis, improved from prior. Electronically Signed   By: Lorenza Cambridge M.D.   On: 05/20/2023 09:02   DG Chest 2 View  Result Date: 05/19/2023 CLINICAL DATA:  Aortic valve stenosis.  Preoperative evaluation. EXAM: CHEST - 2 VIEW COMPARISON:  Radiographs 04/08/2022.  Cardiac CT 04/14/2023. FINDINGS: The heart size  and mediastinal contours are stable with unchanged dilatation of the ascending aorta. The lungs are clear. There is no pleural effusion or pneumothorax. No acute osseous findings are evident status post lower cervical fusion. IMPRESSION: No acute cardiopulmonary process. Stable dilatation of the ascending aorta. Electronically Signed   By: Carey Bullocks M.D.   On: 05/19/2023 09:57   DG Chest Port 1 View  Result Date: 05/19/2023 CLINICAL DATA:  Postop aortic valve replacement and ascending aortic repair. EXAM: PORTABLE CHEST 1 VIEW COMPARISON:  Radiographs 05/18/2023 and 05/14/2023. Cardiac CT 04/14/2023. FINDINGS: 0540 hours. Interval extubation and removal of the enteric tube. Mediastinal drains and right IJ Swan-Ganz catheter are unchanged in position. The heart size and mediastinal contours are stable status post recent median sternotomy and aortic valve replacement. There is mildly increased left basilar atelectasis with a probable small left pleural effusion. No evidence of pneumothorax. The bones appear unchanged. Telemetry leads overlie the chest. IMPRESSION: Interval extubation with mildly increased left basilar atelectasis and probable small left pleural effusion. No pneumothorax. Electronically Signed   By: Carey Bullocks M.D.   On: 05/19/2023 09:55   DG Chest [facility]  Result Date: 05/18/2023 CLINICAL DATA:  Status post aortic valve replacement. EXAM: PORTABLE CHEST 1 VIEW COMPARISON:  05/14/2023 FINDINGS: Endotracheal tube tip is 7.7 cm above the base of the carina. The NG tube passes into the stomach although the distal tip position is not included on the film. Right IJ pulmonary artery catheter tip is in the right main pulmonary artery. Midline pericardial/mediastinal drains evident. Telemetry leads overlie the chest. No evidence for pneumothorax. No focal consolidation or substantial pleural effusion. There is probably some trace atelectasis at the left base. IMPRESSION: 1. Support  apparatus as described without pneumothorax. 2. Probable trace atelectasis at the left base. Electronically Signed   By: Kennith Center M.D.   On: 05/18/2023 15:01   EP STUDY  Result Date: 05/18/2023 See surgical note for result.  VAS US CAROTID  Result Date: 05/14/2023 Carotid Arterial Duplex Study Patient Name:  [name]  Date of Exam:   05/14/2023 Medical Rec #: [id number]       Accession #:    [id number] Date of Birth: [birth year]      Patient Gender: M Patient Age:   54 years Exam Location:  [facility] Procedure:      VAS US CAROTID Referring Phys: Evelene Croon --------------------------------------------------------------------------------  Indications:  Pre-op AVR. Risk Factors: Hypertension, hyperlipidemia, past history of smoking, coronary               artery disease. Performing Technologist: Jean Rosenthal RDMS, RVT  Examination Guidelines: A complete evaluation includes B-mode imaging, spectral Doppler, color Doppler, and power Doppler as needed of all accessible portions of each vessel. Bilateral testing is considered an integral part of a complete examination. Limited examinations for reoccurring indications may be performed as noted.  Right Carotid Findings: +----------+--------+--------+--------+------------------+--------+           PSV cm/sEDV cm/sStenosisPlaque DescriptionComments +----------+--------+--------+--------+------------------+--------+ CCA Prox  75      25                                         +----------+--------+--------+--------+------------------+--------+ CCA Distal55      22                                         +----------+--------+--------+--------+------------------+--------+ ICA Prox  42      16      1-39%   heterogenous               +----------+--------+--------+--------+------------------+--------+ ICA Distal56      26                                          +----------+--------+--------+--------+------------------+--------+ ECA       52      14                                         +----------+--------+--------+--------+------------------+--------+ +----------+--------+-------+----------------+-------------------+           PSV cm/sEDV cmsDescribe        Arm Pressure (mmHG) +----------+--------+-------+----------------+-------------------+ [id number]             Multiphasic, WNL                    +----------+--------+-------+----------------+-------------------+ +---------+--------+--+--------+-+---------+  VertebralPSV cm/s26EDV cm/s8Antegrade +---------+--------+--+--------+-+---------+  Left Carotid Findings: +----------+--------+--------+--------+------------------+------------------+           PSV cm/sEDV cm/sStenosisPlaque DescriptionComments           +----------+--------+--------+--------+------------------+------------------+ CCA Prox  54      18                                                   +----------+--------+--------+--------+------------------+------------------+ CCA Distal56      20                                                   +----------+--------+--------+--------+------------------+------------------+ ICA Prox  29      11                                intimal thickening +----------+--------+--------+--------+------------------+------------------+ ICA Distal47      24                                                   +----------+--------+--------+--------+------------------+------------------+ ECA       60      20                                                   +----------+--------+--------+--------+------------------+------------------+ +----------+--------+--------+----------------+-------------------+           PSV cm/sEDV cm/sDescribe        Arm Pressure (mmHG) +----------+--------+--------+----------------+-------------------+ [id number]               Multiphasic, WNL                    +----------+--------+--------+----------------+-------------------+ +---------+--------+--+--------+--+---------+ VertebralPSV cm/s36EDV cm/s15Antegrade +---------+--------+--+--------+--+---------+   Summary: Right Carotid: Velocities in the right ICA are consistent with a 1-39% stenosis. Left Carotid: The extracranial vessels were near-normal with only minimal wall               thickening or plaque. Vertebrals:  Bilateral vertebral arteries demonstrate antegrade flow. Subclavians: Normal flow hemodynamics were seen in bilateral subclavian              arteries. *See table(s) above for measurements and observations.  Electronically signed by Gerarda Fraction on 05/14/2023 at 7:39:37 PM.    Final     EKG: rapid afib with lateral ST depression   ASSESSMENT AND PLAN:   PAF :  now 3 days post AVR/root replacement Continue iv amiodarone and change to 200 mg bid in am. Will need 30 day monitor on d/c will arrange would not anticoagulate at this point unless recurs on amiodarone given post operative state and anemia. Continue lopressor 12.5 mg PO bid.  AVR:  will need baseline post op TTE in 6-8 weeks SBE prophylaxis low dose asa 81 mg Aortic aneurysm:  post hemi-shield root replacement consider outpatient post op CTA as baseline for surveillance   Signed: Charlton Haws 05/21/2023, 8:11 AM

## 2023-05-21 NOTE — Progress Notes (Signed)
   05/21/23 0500  Assess: MEWS Score  BP (!) 87/68  MAP (mmHg) 76  Pulse Rate 91  ECG Heart Rate (!) 141  Resp (!) 8  SpO2 98 %  Assess: MEWS Score  MEWS Temp 0  MEWS Systolic 1  MEWS Pulse 3  MEWS RR 1  MEWS LOC 0  MEWS Score 5  MEWS Score Color Red  Assess: if the MEWS score is Yellow or Red  Were vital signs taken at a resting state? Yes  Focused Assessment Change from prior assessment (see assessment flowsheet)  Does the patient meet 2 or more of the SIRS criteria? No  MEWS guidelines implemented  Yes, red  Treat  MEWS Interventions Considered administering scheduled or prn medications/treatments as ordered  Take Vital Signs  Increase Vital Sign Frequency  Red: Q1hr x2, continue Q4hrs until patient remains green for 12hrs  Escalate  MEWS: Escalate Red: Discuss with charge nurse and notify provider. Consider notifying RRT. If remains red for 2 hours consider need for higher level of care  Notify: Charge Nurse/RN  Name of Charge Nurse/RN Notified Judeth Cornfield, RN  Provider Notification  Provider Name/Title Dorris Fetch  Date Provider Notified 05/21/23  Time Provider Notified 0430  Method of Notification Page  Notification Reason Change in status  Provider response See new orders  Date of Provider Response 05/21/23  Time of Provider Response 0432  Notify: Rapid Response  Name of Rapid Response RN Notified Mindy, RN  Date Rapid Response Notified 05/21/23  Time Rapid Response Notified 0430  Assess: SIRS CRITERIA  SIRS Temperature  0  SIRS Pulse 1  SIRS Respirations  0  SIRS WBC 0  SIRS Score Sum  1

## 2023-05-21 NOTE — Consult Note (Signed)
NEUROLOGY CONSULTATION NOTE   Date of service: May 21, 2023 Patient Name: [name] MRN:  [id number] DOB:  [birth year] Reason for consult: "Rsided weakness" Requesting Provider: Alleen Borne, MD _ _ _   _ __   _ __ _ _  __ __   _ __   __ _  History of Present Illness  TYREZ SEVER is a 67 y.o. male with PMH significant for arthritis, HTN, HLD, NSTEMI who is admitted and s/p AVR with ascending aortic root replacement on 05/18/23.  He has been in pAfibb post op and was noted to be weak on the right side and dragging his R foot. MRI Brain demonstrated scattered embolic appearing infarcts in BL hemispheres and L cerebellum.  LKW: 3 days ago mRS: 0 tNKASE: not offered, recent surgery Thrombectomy: not offered, too mild to treat NIHSS components Score: Comment  1a Level of Conscious 0[x]  1[]  2[]  3[]      1b LOC Questions 0[x]  1[]  2[]       1c LOC Commands 0[x]  1[]  2[]       2 Best Gaze 0[x]  1[]  2[]       3 Visual 0[x]  1[]  2[]  3[]      4 Facial Palsy 0[x]  1[]  2[]  3[]      5a Motor Arm - left 0[x]  1[]  2[]  3[]  4[]  UN[]    5b Motor Arm - Right 0[x]  1[]  2[]  3[]  4[]  UN[]    6a Motor Leg - Left 0[x]  1[]  2[]  3[]  4[]  UN[]    6b Motor Leg - Right 0[x]  1[]  2[]  3[]  4[]  UN[]    7 Limb Ataxia 0[x]  1[]  2[]  3[]  UN[]     8 Sensory 0[x]  1[]  2[]  UN[]      9 Best Language 0[x]  1[]  2[]  3[]      10 Dysarthria 0[x]  1[]  2[]  UN[]      11 Extinct. and Inattention 0[x]  1[]  2[]       TOTAL: 0      ROS   Constitutional Denies weight loss, fever and chills.   HEENT Denies changes in vision and hearing.   Respiratory Denies SOB and cough.   CV Denies palpitations and CP   GI Denies abdominal pain, nausea, vomiting and diarrhea.   GU Denies dysuria and urinary frequency.   MSK Denies myalgia and joint pain.   Skin Denies rash and pruritus.   Neurological Denies headache and syncope.   Psychiatric Denies recent changes in mood. Denies anxiety and depression.    Past History   Past Medical History:  Diagnosis  Date   Arthritis    Coronary atherosclerosis of native coronary artery    a. NSTEMI (02/2014):  LHC (02/2014):  Mild disease in LAD and CFX; prox RCA occluded with R-R collats, dist RCA filled by L-R collats, inf HK, EF 55%, LVEDP 15 mmHg.  PCI:  Unsuccessful angioplasty of RCA (late presentation of inf MI) - tx medically.   Essential hypertension    GERD (gastroesophageal reflux disease)    Hyperlipidemia    NSTEMI (non-ST elevated myocardial infarction) (HCC) 03/11/14   Renal cell carcinoma of right kidney (HCC)    Partial nephrectomy in 2013   Past Surgical History:  Procedure Laterality Date   AORTIC VALVE REPLACEMENT N/A 05/18/2023   Procedure: AORTIC VALVE REPLACEMENT (AVR) USING INSPIRIS RESILIA 23 MM AORTIC VALVE;  Surgeon: Alleen Borne, MD;  Location: [facility] OR;  Service: Open Heart Surgery;  Laterality: N/A;   BACK SURGERY     1989   CERVICAL DISC SURGERY  04/29/2021   COLONOSCOPY  N/A 12/11/2020   Procedure: COLONOSCOPY;  Surgeon: Franky Macho, MD;  Location: [facility] SUITE;  Service: Gastroenterology;  Laterality: N/A;   LEFT HEART CATHETERIZATION WITH CORONARY ANGIOGRAM N/A 03/17/2014   Procedure: LEFT HEART CATHETERIZATION WITH CORONARY ANGIOGRAM;  Surgeon: Corky Crafts, MD;  Location: [facility] CATH LAB;  Service: Cardiovascular;  Laterality: N/A;   POLYPECTOMY  12/11/2020   Procedure: POLYPECTOMY;  Surgeon: Franky Macho, MD;  Location: [facility] SU[facility];  Service: Gastroenterology;;   REPLACEMENT ASCENDING AORTA N/A 05/18/2023   Procedure: REPLACEMENT OF ASCENDING AORTIC ANEURYSM USING A 30 X 10 MM HEMASHIELD PLATINUM GRAFT;  Surgeon: Alleen Borne, MD;  Location: [facility] OR;  Service: Open Heart Surgery;  Laterality: N/A;  CIRC ARREST   RIGHT HEART CATH AND CORONARY ANGIOGRAPHY N/A 04/03/2023   Procedure: RIGHT HEART CATH AND CORONARY ANGIOGRAPHY;  Surgeon: Tonny Bollman, MD;  Location: [facility] INVASIVE CV LAB;  Service: Cardiovascular;  Laterality: N/A;   ROBOT ASSISTED LAPAROSCOPIC  NEPHRECTOMY  01/14/2012   Procedure: ROBOTIC ASSISTED LAPAROSCOPIC NEPHRECTOMY;  Surgeon: Milford Cage, MD;  Location: [facility] ORS;  Service: Urology;  Laterality: Right;  Robot Laparoscopic Right Partial Nephrectomy     TEE WITHOUT CARDIOVERSION N/A 05/18/2023   Procedure: TRANSESOPHAGEAL ECHOCARDIOGRAM;  Surgeon: Alleen Borne, MD;  Location: [facility] OR;  Service: Open Heart Surgery;  Laterality: N/A;   TONSILLECTOMY     Family History  Problem Relation Age of Onset   Renal Disease Daughter    Hypertension Daughter    Social History   Socioeconomic History   Marital status: Married    Spouse name: Not on file   Number of children: 1   Years of education: 14   Highest education level: Not on file  Occupational History   Occupation: Retired   Tobacco Use   Smoking status: Former    Types: Cigarettes    Quit date: 11/18/1979    Years since quitting: 43.5   Smokeless tobacco: Never  Vaping Use   Vaping Use: Never used  Substance and Sexual Activity   Alcohol use: No    Alcohol/week: 0.0 standard drinks of alcohol   Drug use: Yes    Frequency: 7.0 times per week    Types: Marijuana    Comment: every day   Sexual activity: Not on file  Other Topics Concern   Not on file  Social History Narrative   Lives w/ wife   Caffeine use: 1 cup coffee every morning   Right handed   Social Determinants of Health   Financial Resource Strain: Not on file  Food Insecurity: No Food Insecurity (05/18/2023)   Hunger Vital Sign    Worried About Running Out of Food in the Last Year: Never true    Ran Out of Food in the Last Year: Never true  Transportation Needs: No Transportation Needs (05/18/2023)   PRAPARE - Administrator, Civil Service (Medical): No    Lack of Transportation (Non-Medical): No  Physical Activity: Not on file  Stress: Not on file  Social Connections: Not on file   Allergies  Allergen Reactions   Alcohol Other (See Comments)    Pts skin gets red. Ex. Pt  used alcohol based deodorant and got red things under arms. & Drinks alcohol notices "from heart on up" everything turns red.   Chlorthalidone     Dizziness and Syncopal Episode    Medications   Medications Prior to Admission  Medication Sig Dispense Refill Last Dose   albuterol (  VENTOLIN HFA) 108 (90 Base) MCG/ACT inhaler Inhale 2 puffs into the lungs 2 (two) times daily.   05/17/2023   amLODipine (NORVASC) 5 MG tablet Take 1 tablet (5 mg total) by mouth daily. 90 tablet 3 05/18/2023 at 0400   aspirin EC 81 MG EC tablet Take 1 tablet (81 mg total) by mouth daily.   05/11/2023   atorvastatin (LIPITOR) 80 MG tablet Take 1 tablet (80 mg total) by mouth daily. 90 tablet 3 05/18/2023 at 0400   baclofen (LIORESAL) 10 MG tablet Take 1 tablet (10 mg total) by mouth 3 (three) times daily. (Patient taking differently: Take 10 mg by mouth daily.) 90 each 5 05/18/2023 at 0400   finasteride (PROSCAR) 5 MG tablet Take 5 mg by mouth daily.  3 05/18/2023 at 0400   losartan (COZAAR) 100 MG tablet TAKE 1 TABLET BY MOUTH ONCE DAILY. 90 tablet 1 05/17/2023   metoprolol succinate (TOPROL-XL) 25 MG 24 hr tablet TAKE 1 TABLET ONCE DAILY. 90 tablet 1 05/18/2023 at 0400     Vitals   Vitals:   05/21/23 1200 05/21/23 1709 05/21/23 1950 05/21/23 2144  BP:  109/73 119/72 124/74  Pulse: 70 88 72 74  Resp: 20 15 13    Temp:  98.4 F (36.9 C) 98.3 F (36.8 C)   TempSrc:  Oral Oral   SpO2: 97% 98% 96% 96%  Weight:      Height:         Body mass index is 26.7 kg/m.  Physical Exam   General: Laying comfortably in bed; in no acute distress.  HENT: Normal oropharynx and mucosa. Normal external appearance of ears and nose.  Neck: Supple, no pain or tenderness  CV: No JVD. No peripheral edema.  Pulmonary: Symmetric Chest rise. Normal respiratory effort.  Abdomen: Soft to touch, non-tender.  Ext: No cyanosis, edema, or deformity  Skin: No rash. Normal palpation of skin.   Musculoskeletal: Normal digits and nails by  inspection. No clubbing.   Neurologic Examination  Mental status/Cognition: Alert, oriented to self, place, month and year, good attention.  Speech/language: Fluent, comprehension intact, object naming intact, repetition intact.  Cranial nerves:   CN II Pupils equal and reactive to light, no VF deficits    CN III,IV,VI EOM intact, no gaze preference or deviation, no nystagmus    CN V normal sensation in V1, V2, and V3 segments bilaterally    CN VII no asymmetry, no nasolabial fold flattening    CN VIII normal hearing to speech    CN IX & X normal palatal elevation, no uvular deviation    CN XI 5/5 head turn and 5/5 shoulder shrug bilaterally    CN XII midline tongue protrusion    Motor:  Muscle bulk: normal, tone normal, pronator drift noted in RUe tremor none Mvmt Root Nerve  Muscle Right Left Comments  SA C5/6 Ax Deltoid 5 5   EF C5/6 Mc Biceps 4 5   EE C6/7/8 Rad Triceps 4 5   WF C6/7 Med FCR 3    WE C7/8 PIN ECU 3    F Ab C8/T1 U ADM/FDI 2 5   HF L1/2/3 Fem Illopsoas 4+ 5   KE L2/3/4 Fem Quad 5 5   DF L4/5 D Peron Tib Ant 4+ 5   PF S1/2 Tibial Grc/Sol 4+ 5    Sensation:  Light touch Intact throughout   Pin prick    Temperature    Vibration   Proprioception    Coordination/Complex Motor:  -  Finger to Nose intact Bl - Heel to shin intact Bl - Rapid alternating movement unable to do with right hand - Gait: deferred for patient safety.  Labs   CBC:  Recent Labs  Lab 05/20/23 0514 05/21/23 0551  WBC 11.3* 10.8*  HGB 8.2* 8.2*  HCT 24.6* 24.4*  MCV 92.8 92.1  PLT 97* 100*    Basic Metabolic Panel:  Lab Results  Component Value Date   NA 135 05/21/2023   K 3.8 05/21/2023   CO2 24 05/21/2023   GLUCOSE 141 (H) 05/21/2023   BUN 26 (H) 05/21/2023   CREATININE 0.98 05/21/2023   CALCIUM 8.1 (L) 05/21/2023   GFRNONAA >60 05/21/2023   GFRAA >90 04/24/2014   Lipid Panel:  Lab Results  Component Value Date   LDLCALC 75 12/04/2022   HgbA1c:  Lab Results   Component Value Date   HGBA1C 5.8 (H) 05/14/2023   Urine Drug Screen: No results found for: "LABOPIA", "COCAINSCRNUR", "LABBENZ", "AMPHETMU", "THCU", "LABBARB"  Alcohol Level No results found for: "ETH"  CT Head without contrast(Personally reviewed): CTH was negative for a large hypodensity concerning for a large territory infarct or hyperdensity concerning for an ICH  CT angio Head and Neck with contrast: pending  MRI Brain(Personally reviewed): Scattered small acute infarcts in the left cerebellar hemisphere and both cerebral hemispheres, likely embolic in etiology.  Impression   [name] is a 67 y.o. male with PMH significant for arthritis, HTN, HLD, NSTEMI who is admitted and s/p AVR with ascending aortic root replacement on 05/18/23.  He has been in pAfibb post op and was noted to be weak on the right side and dragging his R foot. MRI Brain demonstrated scattered embolic appearing infarcts in BL hemispheres and L cerebellum. His neurologic examination is notable for R hand weakness and mild R leg weakness.  He was not a candidate for tnkase 2/2 recent surgery, not a candidate for thrombectomy 2/2 too mild to treat.  Etiology of strokes is likely embolic in the setting of recent surgery and post op afibb.  Recommendations  - Frequent Neuro checks per stroke unit protocol - Recommend Vascular imaging with CTA head and neck - Recommend obtaining Lipid panel with LDL - Please start statin if LDL > 70 - Recommend HbA1c to evaluate for diabetes and how well it is controlled. - Would likely benefit from anticoagulation for the noted afibb and strokes. - Recommend DVT ppx - SBP goal - gradual normotension. - Recommend Telemetry monitoring for arrythmia - Recommend bedside swallow screen prior to PO intake. - Stroke education booklet - Recommend PT/OT/SLP consult  ______________________________________________________________________   Thank you for the opportunity to take  part in the care of this patient. If you have any further questions, please contact the neurology consultation attending.  Signed,  Erick Blinks [facility] _ _ _   _ __   _ __ _ _  __ __   _ __   __ _

## 2023-05-21 NOTE — Progress Notes (Signed)
Paged cards again. Awaiting call back

## 2023-05-21 NOTE — Progress Notes (Signed)
Daily weight was not completed due to patient's heart rate. Weight will be gotten once pt is stable.

## 2023-05-21 NOTE — Plan of Care (Signed)
?  Problem: Clinical Measurements: ?Goal: Will remain free from infection ?Outcome: Progressing ?  ?

## 2023-05-21 NOTE — Progress Notes (Signed)
Initial Nutrition Assessment  DOCUMENTATION CODES:   Not applicable  INTERVENTION:  -1 packet Juven BID, each packet provides 95 calories, 2.5 grams of protein (collagen), and 9.8 grams of carbohydrate (3 grams sugar); also contains 7 grams of L-arginine and L-glutamine, 300 mg vitamin C, 15 mg vitamin E, 1.2 mcg vitamin B-12, 9.5 mg zinc, 200 mg calcium, and 1.5 g  Calcium Beta-hydroxy-Beta-methylbutyrate to support wound healing  -liberalize diet to regular  -enocurage po intake   -will not order Ensure due to patient dislike   NUTRITION DIAGNOSIS:   Inadequate oral intake related to decreased appetite as evidenced by meal completion < 25%.   GOAL:   Patient will meet greater than or equal to 90% of their needs   MONITOR:   PO intake, Labs, Diet advancement, Skin, I & O's, Weight trends, Supplement acceptance  REASON FOR ASSESSMENT:   Consult Assessment of nutrition requirement/status, Poor PO  ASSESSMENT:  67 y.o. male with PMHx including HLD, CAD s/p NSTEMI in 2015, renal cell carcinoma s/p R nephrectomy in 2013, severe degenerative spine disease, newly diagnosed severe aortic stenosis and ascending aortic aneurysm who presents for aortic valve replacement, replacement of ascending aortic aneurysm and TEE  7/1 AVR with ascending root replacement for severe AS   Trying to eat breakfast this morning  Visited patient at bedside who was drowsy but woke up easily and provided hx. He reports a good appetite before coming to the hospital. He reports he ate whatever he wanted and ate 2-3 meals per day with snacks and likes to drink soda.   He does not like the food here and thinks he would eat better if his diet was liberalized, RD to reach out to MD.   Patient had an Ensure at bedside and reports he does not like them.  Patient denies weight loss, N/V/D/C, trouble chewing/swallowing.   Labs: Glu 141, BUN 26  Meds: lipitor, aspirin, dulcolax, protonix, KCl, NS, tylenol   Wt: stable wt  05/20/23 79.7 kg  05/14/23 76.7 kg  04/20/23 77.1 kg  04/03/23 77.1 kg  03/23/23 80.4 kg  02/23/23 80.9 kg  02/20/23 79.1 kg   PO: 0% po intake x 3 meals  I/O's:  -2.6 L since admission   NUTRITION - FOCUSED PHYSICAL EXAM:  Flowsheet Row Most Recent Value  Orbital Region No depletion  Upper Arm Region No depletion  Thoracic and Lumbar Region Unable to assess  Buccal Region Mild depletion  Temple Region No depletion  Clavicle Bone Region No depletion  Clavicle and Acromion Bone Region No depletion  Scapular Bone Region Unable to assess  Dorsal Hand No depletion  Patellar Region No depletion  Anterior Thigh Region No depletion  Posterior Calf Region No depletion  Edema (RD Assessment) None  Hair Reviewed  Eyes Reviewed  Mouth Reviewed  Skin Reviewed  Nails Reviewed       Diet Order:   Diet Order             Diet regular Room service appropriate? Yes; Fluid consistency: Thin  Diet effective now                   EDUCATION NEEDS:   Education needs have been addressed  Skin:  Skin Assessment: Skin Integrity Issues: Skin Integrity Issues:: Incisions Incisions: chest  Last BM:  unknown  Height:   Ht Readings from Last 1 Encounters:  05/21/23 5\' 8"  (1.727 m)    Weight:   Wt Readings from Last 1 Encounters:  05/20/23 79.7 kg    Ideal Body Weight:     BMI:  Body mass index is 26.7 kg/m.  Estimated Nutritional Needs:   Kcal:  1610-9604  Protein:  95-120 g  Fluid:  >2 L    Leodis Rains, RDN, LDN  Clinical Nutrition

## 2023-05-21 NOTE — Progress Notes (Addendum)
While rapid response was still on unit, patient went into SVT with rate as high as 200. EKG done showing afib RVR. B/p low so 500 bolus given. Patient diaphoretic, pt feeling hot. Pt afebrile. Patient denies SOB, CP. Patient does not feel his heart beating fast  Called Dr. Dorris Fetch. Started on amio gtt per protocol (150 mg bolus) given.   At this time, HR is still 150s. Patient is still hypotensive  Please see doc flowsheets for b/p

## 2023-05-21 NOTE — Progress Notes (Addendum)
[address]       [place] [address]             [phone]      3 Days Post-Op Procedure(s) (LRB): AORTIC VALVE REPLACEMENT (AVR) USING INSPIRIS RESILIA 23 MM AORTIC VALVE (N/A) REPLACEMENT OF ASCENDING AORTIC ANEURYSM USING A 30 X 10 MM HEMASHIELD PLATINUM GRAFT (N/A) TRANSESOPHAGEAL ECHOCARDIOGRAM (N/A) Subjective: Pt states he feels ok this AM but still complains of right arm weakness.   Objective: Vital signs in last 24 hours: Temp:  [97.6 F (36.4 C)-97.9 F (36.6 C)] 97.6 F (36.4 C) (07/03 2305) Pulse Rate:  [36-163] 163 (07/04 0618) Cardiac Rhythm: Normal sinus rhythm;Other (Comment) (07/04 0157) Resp:  [7-20] 16 (07/04 0618) BP: (83-128)/(55-82) 96/62 (07/04 0618) SpO2:  [93 %-100 %] 99 % (07/04 0618)  Hemodynamic parameters for last 24 hours:    Intake/Output from previous day: 07/03 0701 - 07/04 0700 In: 300  Out: 940 [Urine:900; Chest Tube:40] Intake/Output this shift: No intake/output data recorded.  General appearance: cooperative, fatigued, and no distress Neurologic: right sided weakness Heart: regular rate and rhythm, S1, S2 normal, no murmur, click, rub or gallop Lungs: clear to auscultation bilaterally Abdomen: soft, non-tender; bowel sounds normal; no masses,  no organomegaly Extremities: no edema, no redness or sign of DVT on legs or right arm. R radial pulse was confirmed with doppler late last night, no longer cool to touch.  Wound: Clean and dry dressing in place  Lab Results: Recent Labs    05/20/23 0514 05/21/23 0551  WBC 11.3* 10.8*  HGB 8.2* 8.2*  HCT 24.6* 24.4*  PLT 97* 100*   BMET:  Recent Labs    05/20/23 0514 05/21/23 0551  NA 135 135  K 3.9 3.8  CL 103 104  CO2 24 24  GLUCOSE 132* 141*  BUN 22 26*  CREATININE 1.09 0.98  CALCIUM 8.2* 8.1*    PT/INR:  Recent Labs    05/18/23 1409  LABPROT 19.5*  INR 1.6*   ABG    Component Value Date/Time   PHART 7.371 05/18/2023 2030   HCO3 16.6  (L) 05/18/2023 2030   TCO2 17 (L) 05/18/2023 2030   ACIDBASEDEF 8.0 (H) 05/18/2023 2030   O2SAT 99 05/18/2023 2030   CBG (last 3)  Recent Labs    05/20/23 0740 05/20/23 1137 05/20/23 1608  GLUCAP 142* 125* 117*    Assessment/Plan: S/P Procedure(s) (LRB): AORTIC VALVE REPLACEMENT (AVR) USING INSPIRIS RESILIA 23 MM AORTIC VALVE (N/A) REPLACEMENT OF ASCENDING AORTIC ANEURYSM USING A 30 X 10 MM HEMASHIELD PLATINUM GRAFT (N/A) TRANSESOPHAGEAL ECHOCARDIOGRAM (N/A)  Neuro: Pt developed right hand weakness around 4:00AM prior to developing afib with RVR. On exam this AM pt has right arm, right hand and right foot weakness. Otherwise neurologically intact. Will get head CT.   CV: Pt developed afib with RVR last night, rates in the 150s-200. Pt has been hypotensive. Amiodarone bolus and drip was started, rates did not improve, cardiology was called and recommended starting ACLS protocol. Pt converted to NSR around 7AM this AM. SBP 100 and HR 70s now. Continue Amio drip and low dose Lopressor and closely monitor.   Pulm: Saturating well on RA. CXR with left basilar atelectasis. Encourage IS and ambulation.   GI: -BM, passing gas. Pt states his appetite has slightly improved, he is going to try to eat breakfast this AM.   Endo: Preop A1C 5.8. Not on medications at home. CBGs controlled. SSI and  CBGs have been discontinued.   Renal: Improved Cr 0.98. Traumatic foley insertion due to BPH. Pt has had hematuria, foley removed yesterday. On Finasteride. UO 900cc/24hrs. No weight taken this AM. Will hold Lasix due to hypotension. K 3.8, supplement.   Thrombocytopenia: Plt count improving, 100,000 this AM. Continue to monitor.   ID: Leukocytosis trending down, afebrile.   Expected postop ABLA: stable H/H 8.2/24.4. Continue iron supplement.  Dispo: Pt converted to NSR with Amio drip, continue to monitor HR and rhythm closely. Will get head CT for acute right sided weakness.    LOS: 3 days     Jenny Reichmann, PA-C 05/21/2023  Afib overnight Back in sinus Some right sided weekness, will obtain head CT  Matti Minney O Bonne Whack

## 2023-05-21 NOTE — Progress Notes (Signed)
PT Cancellation Note  Patient Details Name: [name] MRN: [id number] DOB: [birth year]   Cancelled Treatment:    Reason Eval/Treat Not Completed: Medical issues which prohibited therapy. Pt with acute onset rt weakness and workup in progress. Will follow up tomorrow.   Angelina Ok [facility] 05/21/2023, 10:45 AM Skip Mayer PT Acute [employer] [phone]

## 2023-05-21 NOTE — Significant Event (Signed)
Rapid Response Event Note   Reason for Call :  Weak R hand grip  Pt transferred to 4E at 0130 from 2H. Per 2H RN, pt's grip was strong and he was able to hold his water cup at 2327 when she gave him tylenol. Initial Focused Assessment:  Pt lying in bed with eyes open, in no visible distress. He is alert and oriented, answers questions, and follows commands. He is able to open and close his R hand, however, this is done very slowly and he is unable to grip anything. He says his R hand is sore. R radial pulse palpable. NIH-0. Hand grip last noted to be normal at 2327  HR-83, RR-14, SpO2-97% on RA.  Interventions:  No RRT interventions needed at this time  Plan of Care:  Pt had R radial A-line post-op. This could be cause of R wrist/hand pain/decreased grip strength. Pt neuro intact   Event Summary:   MD Notified: Dorris Fetch notified by 4E RN Call [phone] Arrival Time:0403 End Time:0410   Update: 5409. Pt HR-200s Afib RVR, SBP-80s. Pt diaphoretic, denies CP/SOB/dizziness.  Interventions: EKG-Afib RVR, ST dep in V3-V6 500cc NS bolus 150mg  amiod bolus, amiod gtt at 60mg   Plan:  Pt HR and BP improving after amiod bolus and gtt started. Continue to monitor pt. Please call RRT if further assistance needed.   Terrilyn Saver, RN

## 2023-05-21 NOTE — Progress Notes (Signed)
Cards will be here in about 20 min. Suggested starting ACLS algorithm. Notified rapid response

## 2023-05-22 ENCOUNTER — Other Ambulatory Visit: Payer: Self-pay | Admitting: Cardiology

## 2023-05-22 ENCOUNTER — Inpatient Hospital Stay (HOSPITAL_COMMUNITY): Payer: Medicare Other

## 2023-05-22 DIAGNOSIS — I639 Cerebral infarction, unspecified: Secondary | ICD-10-CM

## 2023-05-22 DIAGNOSIS — I4891 Unspecified atrial fibrillation: Secondary | ICD-10-CM

## 2023-05-22 DIAGNOSIS — Z952 Presence of prosthetic heart valve: Secondary | ICD-10-CM

## 2023-05-22 LAB — ECHO INTRAOPERATIVE TEE
AR max vel: 2.26 cm2
AV Area VTI: 2.28 cm2
AV Area mean vel: 2.05 cm2
AV Mean grad: 4 mmHg
AV Peak grad: 8.3 mmHg
Ao pk vel: 1.44 m/s
Height: 68 in
MV VTI: 1.71 cm2

## 2023-05-22 LAB — LIPID PANEL
Cholesterol: 77 mg/dL (ref 0–200)
HDL: 31 mg/dL — ABNORMAL LOW (ref >40)
LDL Cholesterol: 28 mg/dL (ref 0–99)
Total CHOL/HDL Ratio: 2.5 RATIO
Triglycerides: 88 mg/dL (ref <150)
VLDL: 18 mg/dL (ref 0–40)

## 2023-05-22 MED ORDER — AMIODARONE HCL 200 MG PO TABS
400.0000 mg | ORAL_TABLET | Freq: Two times a day (BID) | ORAL | Status: DC
  Administered 2023-05-22 – 2023-05-25 (×7): 400 mg via ORAL
  Filled 2023-05-22 (×7): qty 2

## 2023-05-22 MED ORDER — WARFARIN - PHYSICIAN DOSING INPATIENT: Freq: Every day | Status: DC

## 2023-05-22 MED ORDER — PROPOFOL 1000 MG/100ML IV EMUL
INTRAVENOUS | Status: AC
  Filled 2023-05-22: qty 100

## 2023-05-22 MED ORDER — STROKE: EARLY STAGES OF RECOVERY BOOK
Freq: Once | Status: AC
  Filled 2023-05-22: qty 1

## 2023-05-22 MED ORDER — WARFARIN SODIUM 5 MG PO TABS
5.0000 mg | ORAL_TABLET | Freq: Once | ORAL | Status: AC
  Administered 2023-05-22: 5 mg via ORAL
  Filled 2023-05-22: qty 1

## 2023-05-22 MED ORDER — IOHEXOL 350 MG/ML SOLN
75.0000 mL | Freq: Once | INTRAVENOUS | Status: AC | PRN
  Administered 2023-05-22: 75 mL via INTRAVENOUS

## 2023-05-22 NOTE — Progress Notes (Signed)
Inpatient Rehab Admissions Coordinator:  ? ?Per therapy recommendations,  patient was screened for [name] candidacy by Lova Urbieta, MS, CCC-SLP. At this time, Pt. Appears to be a a potential candidate for [name]. I will place   order for rehab consult per protocol for full assessment. Please contact me any with questions. ? ?Libra Gatz, MS, CCC-SLP ?Rehab Admissions Coordinator  ?[phone] (celll) ?[phone] (office) ? ?

## 2023-05-22 NOTE — Plan of Care (Signed)
  Problem: Education: Goal: Knowledge of General Education information will improve Description: Including pain rating scale, medication(s)/side effects and non-pharmacologic comfort measures Outcome: Progressing   Problem: Activity: Goal: Risk for activity intolerance will decrease Outcome: Progressing   Problem: Nutrition: Goal: Adequate nutrition will be maintained Outcome: Progressing   

## 2023-05-22 NOTE — Progress Notes (Addendum)
STROKE TEAM PROGRESS NOTE   BRIEF HPI Mr. [name] is a 67 y.o. male with history of rhinitis, hypertension, hyperlipidemia, non-STEMI and recent aortic valve replacement with aortic root replacement on 7/1 with postoperative A-fib presenting with right-sided weakness.  MRI brain revealed scattered small embolic infarcts in bilateral hemispheres and left cerebellum.    SIGNIFICANT HOSPITAL EVENTS 7/1 AVR, postoperative A-fib  INTERIM HISTORY/SUBJECTIVE Patient has been hemodynamically stable.  His neurological exam remains stable, but he verbalizes anxiety about his diagnosis. MRI scan of the brain shows scattered small acute infarcts in the left cerebellum and bilateral cerebral hemispheres.  OBJECTIVE  CBC    Component Value Date/Time   WBC 10.8 (H) 05/21/2023 0551   RBC 2.65 (L) 05/21/2023 0551   HGB 8.2 (L) 05/21/2023 0551   HGB 14.6 03/23/2023 1236   HCT 24.4 (L) 05/21/2023 0551   HCT 44.7 03/23/2023 1236   PLT 100 (L) 05/21/2023 0551   PLT 240 03/23/2023 1236   MCV 92.1 05/21/2023 0551   MCV 92 03/23/2023 1236   MCH 30.9 05/21/2023 0551   MCHC 33.6 05/21/2023 0551   RDW 14.3 05/21/2023 0551   RDW 13.1 03/23/2023 1236   LYMPHSABS 1.6 04/08/2014 0447   MONOABS 0.4 04/08/2014 0447   EOSABS 0.2 04/08/2014 0447   BASOSABS 0.0 04/08/2014 0447    BMET    Component Value Date/Time   NA 135 05/21/2023 0551   NA 141 03/23/2023 1236   K 3.8 05/21/2023 0551   CL 104 05/21/2023 0551   CO2 24 05/21/2023 0551   GLUCOSE 141 (H) 05/21/2023 0551   BUN 26 (H) 05/21/2023 0551   BUN 14 03/23/2023 1236   CREATININE 0.98 05/21/2023 0551   CREATININE 0.94 09/20/2020 0820   CALCIUM 8.1 (L) 05/21/2023 0551   EGFR 94 03/23/2023 1236   GFRNONAA >60 05/21/2023 0551    IMAGING past 24 hours No results found.  Vitals:   05/22/23 0539 05/22/23 0601 05/22/23 0847 05/22/23 1145  BP: 134/75  125/72 115/70  Pulse: 78  75 73  Resp: 16  13 13   Temp: 97.8 F (36.6 C)  98.5 F  (36.9 C) 98.3 F (36.8 C)  TempSrc: Oral  Oral Oral  SpO2:   100% 100%  Weight:  69.6 kg    Height:         PHYSICAL EXAM General:  Alert, well-nourished, well-developed patient, mildly anxious Psych:  Mood and affect appropriate for situation Respiratory:  Regular, unlabored respirations on room air   NEURO:  Mental Status: AA&Ox3, patient is able to give clear and coherent history Speech/Language: speech is without dysarthria or aphasia.   Cranial Nerves:  II: PERRL. Visual fields full.  III, IV, VI: EOMI. Eyelids elevate symmetrically.  V: Sensation is intact to light touch and symmetrical to face.  VII: Face is symmetrical resting and smiling VIII: hearing intact to voice. IX, X:Phonation is normal.  XII: tongue is midline without fasciculations. Motor: 5/5 strength to right upper and lower extremities, slight weakness of left upper and lower extremities.  Diminished right grip weakness.  Fine finger movements are diminished on the right.  Orbits left or right upper extremity. Tone: is normal and bulk is normal Sensation- Intact to light touch bilaterally.  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  CTA head & neck pending MRI scattered small acute infarcts in bilateral cerebral hemispheres and left cerebellum, likely embolic 2D Echo pending LDL 28 HgbA1c 5.8 VTE prophylaxis -SCDs aspirin 81 mg daily prior to admission, now on aspirin 81 mg daily indefinitely, will anticoagulate for A-fib when okay with cardiology Therapy recommendations:  Home Health PT Disposition: Pending   Atrial fibrillation Home Meds: None Continue telemetry monitoring Begin anticoagulation discussion with  cardiology  Hypertension Home meds: Amlodipine 5 mg daily, losartan 100 mg daily, metoprolol XL 25 mg daily Stable Maintain normotension  Hyperlipidemia Home meds: Atorvastatin 80 mg daily, resumed in hospital LDL 28, goal < 70 Continue statin at discharge  Other Stroke Risk Factors Recent cardiac surgery   Other Active Problems None  Hospital day # 4  Patient seen by NP and then by MD, MD to edit note is needed Cortney E Ernestina Columbia , MSN, AGACNP-BC [facility] See Amion for schedule and pager information 05/22/2023 1:42 PM  STROKE MD NOTE :  I have personally obtained history,examined this patient, reviewed notes, independently viewed imaging studies, participated in medical decision making and plan of care.ROS completed by me personally and pertinent positives fully documented  I have made any additions or clarifications directly to the above note. Agree with note above.  Patient had elective aortic valve surgery and postoperatively developed right-sided weakness which appears to be improving.  MRI scan shows small bilateral cerebral embolic as well as left cerebellar infarcts likely postprocedure.  He also has A-fib which could have also contributed.  Recommend aspirin for now and resume anticoagulation when safe vascular cardiac surgery.  Overlaid out of bed.  Physical Occupational Therapy consults.  Aggressive risk factor modification.  Long discussion with patient about his stroke and discussion about evaluation and treatment and answering questions.  Greater than 50% time during this 50-minute visit was spent counseling and coordination of care about his postoperative risks embolic strokes and discussion with care team and answering questions.  Delia Heady, MD Medical Director [facility] Pager: [phone] 05/22/2023 4:37 PM    To contact Stroke Continuity provider, please refer to [email or web address]. After hours, contact General Neurology

## 2023-05-22 NOTE — Evaluation (Signed)
Occupational Therapy RE-Evaluation Patient Details Name: [name] MRN: [id number] DOB: 1955-12-27 Today's Date: 05/22/2023   History of Present Illness Patient is a 67 y/o male admitted 05/18/23 with severe aortic stenosis Thoracic aortic aneurysm and underwent AVR and replacement of ascending aortic aneurysm and TTE. Pt with post op afib and on 7/4 pr with rt sided weakness and MRI showed scattered small embolic infarcts in bilateral hemispheres and left cerebellum. PMH positive for NSTEMI 2015 c/p cath, renal cell carcinoma s/p partial R nephrectomy 2013, severe degenerative spine disease s/p low back surgery in 80's and cervical disc surgery 2022.   Clinical Impression   PT admitted with new onset of CVA with L cerebellum and bil hemispheres. Pt currently with functional limitiations due to the deficits listed below (see OT problem list). Pt with new onset of stroke symptoms after AVR and replacement of ascending aortic aneurysm. Pt with decreased R UE fine motor function at this time. Recommendations and goals updated at this time.  Pt will benefit from skilled OT to increase their independence and safety with adls and balance to allow discharge Patient will benefit from intensive inpatient follow up therapy, >3 hours/day .       Recommendations for follow up therapy are one component of a multi-disciplinary discharge planning process, led by the attending physician.  Recommendations may be updated based on patient status, additional functional criteria and insurance authorization.   Assistance Recommended at Discharge Intermittent Supervision/Assistance  Patient can return home with the following A little help with walking and/or transfers;A little help with bathing/dressing/bathroom    Functional Status Assessment  Patient has had a recent decline in their functional status and demonstrates the ability to make significant improvements in function in a reasonable and predictable amount  of time.  Equipment Recommendations  BSC/3in1;Other (comment) (RW)    Recommendations for Other Services Rehab consult     Precautions / Restrictions Precautions Precautions: Fall;Sternal Precaution Comments: sternal precautions Restrictions Weight Bearing Restrictions: Yes RUE Weight Bearing: Non weight bearing LUE Weight Bearing: Non weight bearing Other Position/Activity Restrictions: sternal precautions      Mobility Bed Mobility               General bed mobility comments: in chair on arriavl    Transfers Overall transfer level: Needs assistance Equipment used: Rolling walker (2 wheels) Transfers: Sit to/from Stand Sit to Stand: Min assist           General transfer comment: cues for hand placement and safety      Balance Overall balance assessment: Needs assistance Sitting-balance support: Feet supported, No upper extremity supported Sitting balance-Leahy Scale: Good     Standing balance support: Bilateral upper extremity supported, During functional activity, Reliant on assistive device for balance Standing balance-Leahy Scale: Poor                             ADL either performed or assessed with clinical judgement   ADL Overall ADL's : Needs assistance/impaired                         Toilet Transfer: Minimal assistance;Ambulation;BSC/3in1 Toilet Transfer Details (indicate cue type and reason): pt unable to void bladder sitting into the toilet. pt will need urinal as he states second time he has peed out the front of the [facility]. LB hygiene performed Toileting- Clothing Manipulation and Hygiene: Maximal assistance Toileting - Clothing Manipulation Details (  indicate cue type and reason): pt able to doff L sock but requires total (A) for R LE and donning bil socks     Functional mobility during ADLs: Minimal assistance;Rolling walker (2 wheels)       Vision Baseline Vision/History: 1 Wears glasses       Perception      Praxis      Pertinent Vitals/Pain Pain Assessment Pain Assessment: No/denies pain     Hand Dominance Right   Extremity/Trunk Assessment Upper Extremity Assessment Upper Extremity Assessment: RUE deficits/detail RUE Deficits / Details: noted to have decreased supination/ pronation, wrist extension limited, decreased digit activation, RUE Sensation: decreased light touch;decreased proprioception RUE Coordination: decreased fine motor;decreased gross motor   Lower Extremity Assessment Lower Extremity Assessment: Defer to PT evaluation RLE Deficits / Details: noted to drag R LE with transfer   Cervical / Trunk Assessment Cervical / Trunk Assessment: Normal   Communication Communication Communication: No difficulties   Cognition Arousal/Alertness: Awake/alert Behavior During Therapy: WFL for tasks assessed/performed Overall Cognitive Status: Within Functional Limits for tasks assessed                                       General Comments  VSS on RA    Exercises Exercises: Other exercises Other Exercises Other Exercises: attempted to use increased size grip on hand for utensil and pt unable to grasp. recommending universal cuff for self feeding. pt states " i get to frustrated i just use my left" pt educated on why use of R UE is important and to do bil UE task to help with return function Other Exercises: pt educated on wrist extension and pad to pad exercises for R hand   Shoulder Instructions      Home Living Family/patient expects to be discharged to:: Private residence Living Arrangements: Spouse/significant other Available Help at Discharge: Family;Available PRN/intermittently Type of Home: House Home Access: Stairs to enter Entrance Stairs-Number of Steps: 4   Home Layout: One level     Bathroom Shower/Tub: Walk-in shower         Home Equipment: Agricultural consultant (2 wheels);Cane - single point;Wheelchair - manual;BSC/3in1          Prior  Functioning/Environment Prior Level of Function : Independent/Modified Independent;Driving             Mobility Comments: uses cane PRN ADLs Comments: ind, reports it just takes time        OT Problem List: Decreased strength;Decreased range of motion;Decreased activity tolerance;Impaired balance (sitting and/or standing);Decreased safety awareness;Cardiopulmonary status limiting activity;Decreased knowledge of precautions      OT Treatment/Interventions: Self-care/ADL training;Therapeutic exercise;Energy conservation;DME and/or AE instruction;Therapeutic activities;Patient/family education;Balance training    OT Goals(Current goals can be found in the care plan section) Acute Rehab OT Goals Patient Stated Goal: to get R hand working OT Goal Formulation: With patient Time For Goal Achievement: 06/05/23 Potential to Achieve Goals: Good  OT Frequency: Min 1X/week    Co-evaluation              AM-PAC OT "6 Clicks" Daily Activity     Outcome Measure Help from another person eating meals?: A Lot Help from another person taking care of personal grooming?: A Lot Help from another person toileting, which includes using toliet, bedpan, or urinal?: A Lot Help from another person bathing (including washing, rinsing, drying)?: A Lot Help from another person to put on and  taking off regular upper body clothing?: A Lot Help from another person to put on and taking off regular lower body clothing?: A Lot 6 Click Score: 12   End of Session Equipment Utilized During Treatment: Gait belt;Rolling walker (2 wheels) Nurse Communication: Mobility status;Precautions  Activity Tolerance: Patient tolerated treatment well Patient left: in chair;with call bell/phone within reach  OT Visit Diagnosis: Unsteadiness on feet (R26.81);Other abnormalities of gait and mobility (R26.89);Muscle weakness (generalized) (M62.81)                Time: 9147-8295 OT Time Calculation (min): 21 min Charges:   OT General Charges $OT Visit: 1 Visit OT Evaluation $OT Re-eval: 1 Re-eval   Brynn, OTR/L  Acute Rehabilitation Services Office: [phone] .   Mateo Flow 05/22/2023, 3:58 PM

## 2023-05-22 NOTE — Progress Notes (Addendum)
Rounding Note    Patient Name: [name] Date of Encounter: 05/22/2023  [facility] Cardiologist: Nona Dell, MD   Subjective   Denies any SOB or chest pain  Inpatient Medications    Scheduled Meds:  acetaminophen  1,000 mg Oral Q6H   Or   acetaminophen (TYLENOL) oral liquid 160 mg/5 mL  1,000 mg Per Tube Q6H   amiodarone  400 mg Oral BID   aspirin EC  81 mg Oral Daily   Or   aspirin  81 mg Per Tube Daily   atorvastatin  80 mg Oral Daily   baclofen  10 mg Oral Daily   Chlorhexidine Gluconate Cloth  6 each Topical Daily   Fe Fum-Vit C-Vit B12-FA  1 capsule Oral QPC breakfast   finasteride  5 mg Oral Daily   metoprolol tartrate  12.5 mg Oral BID   Or   metoprolol tartrate  12.5 mg Per Tube BID   nutrition supplement (JUVEN)  1 packet Oral BID BM   pantoprazole  40 mg Oral Daily   sodium chloride flush  3 mL Intravenous Q12H   warfarin  5 mg Oral ONCE-1600   Warfarin - Physician Dosing Inpatient   Does not apply q1600   Continuous Infusions:  sodium chloride     PRN Meds: sodium chloride, metoprolol tartrate, ondansetron (ZOFRAN) IV, oxyCODONE, sodium chloride flush, traMADol   Vital Signs    Vitals:   05/21/23 2317 05/22/23 0539 05/22/23 0601 05/22/23 0847  BP: 106/68 134/75  125/72  Pulse: 63 78  75  Resp: 19 16  13   Temp: 97.9 F (36.6 C) 97.8 F (36.6 C)  98.5 F (36.9 C)  TempSrc: Oral Oral  Oral  SpO2: 98%   100%  Weight:   69.6 kg   Height:        Intake/Output Summary (Last 24 hours) at 05/22/2023 0941 Last data filed at 05/22/2023 0901 Gross per 24 hour  Intake 593.53 ml  Output --  Net 593.53 ml      05/22/2023    6:01 AM 05/20/2023    5:00 AM 05/19/2023    5:00 AM  Last 3 Weights  Weight (lbs) 153 lb 7 oz 175 lb 9.6 oz 175 lb 7.8 oz  Weight (kg) 69.6 kg 79.652 kg 79.6 kg      Telemetry    NSR, afib with RVR from 4:14AM until 6:53AM on 7/4, no recurrence since - Personally Reviewed  ECG    Afib with RVR with ST  depression in the lateral leads - Personally Reviewed  Physical Exam   GEN: No acute distress.   Neck: No JVD Cardiac: RRR, no murmurs, rubs, or gallops.  Respiratory: Clear to auscultation bilaterally. GI: Soft, nontender, non-distended  MS: No edema; No deformity. Neuro:  Nonfocal  Psych: Normal affect   Labs    High Sensitivity Troponin:  No results for input(s): "TROPONINIHS" in the last 720 hours.   Chemistry Recent Labs  Lab 05/18/23 1945 05/18/23 2030 05/19/23 0500 05/19/23 1721 05/20/23 0514 05/21/23 0551  NA 137   < > 135 133* 135 135  K 4.3   < > 4.5 4.2 3.9 3.8  CL 108  --  108 103 103 104  CO2 21*  --  20* 22 24 24   GLUCOSE 131*  --  122* 141* 132* 141*  BUN 14  --  14 20 22  26*  CREATININE 1.03  --  1.02 1.10 1.09 0.98  CALCIUM 7.6*  --  7.8* 8.0* 8.2* 8.1*  MG 3.0*  --  2.3 2.3  --   --   GFRNONAA >60  --  >60 >60 >60 >60  ANIONGAP 8  --  7 8 8 7    < > = values in this interval not displayed.    Lipids  Recent Labs  Lab 05/22/23 0115  CHOL 77  TRIG 88  HDL 31*  LDLCALC 28  CHOLHDL 2.5    Hematology Recent Labs  Lab 05/19/23 1721 05/20/23 0514 05/21/23 0551  WBC 13.0* 11.3* 10.8*  RBC 2.69* 2.65* 2.65*  HGB 8.0* 8.2* 8.2*  HCT 24.7* 24.6* 24.4*  MCV 91.8 92.8 92.1  MCH 29.7 30.9 30.9  MCHC 32.4 33.3 33.6  RDW 14.4 14.4 14.3  PLT 106* 97* 100*   Thyroid No results for input(s): "TSH", "FREET4" in the last 168 hours.  BNPNo results for input(s): "BNP", "PROBNP" in the last 168 hours.  DDimer No results for input(s): "DDIMER" in the last 168 hours.   Radiology    MR BRAIN WO CONTRAST  Result Date: 05/21/2023 CLINICAL DATA:  Stroke suspected. Status post aortic valve replacement. EXAM: MRI HEAD WITHOUT CONTRAST TECHNIQUE: Multiplanar, multiecho pulse sequences of the brain and surrounding structures were obtained without intravenous contrast. COMPARISON:  Same-day CT head FINDINGS: Brain: Are numerous small foci of diffusion restriction  in the left cerebellar hemisphere and both cerebral hemispheres in the MCA and PCA distributions consistent with small acute infarcts, likely embolic in etiology. There is no associated hemorrhage or mass effect. There is no acute intracranial hemorrhage or extra-axial fluid collection Background parenchymal volume is normal. The ventricles are normal in size. There is a minimal background chronic small-vessel ischemic change. There are scattered punctate foci of SWI signal dropout which are nonspecific but may be related to prior emboli. The pituitary and suprasellar region are normal. There is no mass lesion. There is no mass effect or midline shift. Vascular: Normal flow voids. Skull and upper cervical spine: Normal marrow signal. Sinuses/Orbits: There is mild mucosal thickening in the paranasal sinuses. The globes and orbits are unremarkable. Other: The mastoid air cells and middle ear cavities are clear. IMPRESSION: Scattered small acute infarcts in the left cerebellar hemisphere and both cerebral hemispheres, likely embolic in etiology. Electronically Signed   By: Lesia Hausen M.D.   On: 05/21/2023 16:53   CT HEAD WO CONTRAST ( )  Result Date: 05/21/2023 CLINICAL DATA:  Neuro deficit, acute, stroke suspected EXAM: CT HEAD WITHOUT CONTRAST TECHNIQUE: Contiguous axial images were obtained from the base of the skull through the vertex without intravenous contrast. RADIATION DOSE REDUCTION: This exam was performed according to the departmental dose-optimization program which includes automated exposure control, adjustment of the mA and/or kV according to patient size and/or use of iterative reconstruction technique. COMPARISON:  None Available. FINDINGS: Brain: No evidence of acute infarction, hemorrhage, hydrocephalus, extra-axial collection or mass lesion/mass effect. Vascular: No hyperdense vessel or unexpected calcification. Skull: Normal. Negative for fracture or focal lesion. Sinuses/Orbits: No middle  ear or mastoid effusion. Mucosal thickening right maxillary sinus. Orbits are unremarkable. Other: None. IMPRESSION: No hemorrhage or CT evidence of an acute cortical infarct. If there is high clinical concern, further evaluation with brain MRI could considered. Electronically Signed   By: Lorenza Cambridge M.D.   On: 05/21/2023 10:39    Cardiac Studies   Echo 11/14/2022  1. Left ventricular ejection fraction, by estimation, is 55 to 60%. The  left ventricle has normal function. The left  ventricle has no regional  wall motion abnormalities. There is mild left ventricular hypertrophy.  Left ventricular diastolic parameters  are consistent with Grade I diastolic dysfunction (impaired relaxation).  The average left ventricular global longitudinal strain is -19.0 %. The  global longitudinal strain is normal.   2. Right ventricular systolic function is normal. The right ventricular  size is normal. Tricuspid regurgitation signal is inadequate for assessing  PA pressure.   3. The mitral valve is normal in structure. Trivial mitral valve  regurgitation. No evidence of mitral stenosis.   4. The tricuspid valve is abnormal.   5. The aortic valve has an indeterminant number of cusps. Aortic valve  regurgitation is moderate. Severe aortic valve stenosis.   6. Limited visualization of the ascending aorta, the visualized parts are  at least moderately dilated. Consider additional imaging with CTA or MRA  to better evalute degree and extent of aortic aneurysm. Marland Kitchen Aortic  dilatation noted. There is mild  dilatation of the aortic root, measuring 42 mm. There is moderate  dilatation of the ascending aorta, measuring 48 mm.   7. The inferior vena cava is normal in size with greater than 50%  respiratory variability, suggesting right atrial pressure of 3 mmHg.    Cath 04/03/2023   Prox RCA lesion is 100% stenosed.   There is severe aortic valve stenosis.   1.  Known severe aortic stenosis by noninvasive  assessment, likely bicuspid valve 2.  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  Diffuse nonobstructive plaquing in the left main, LAD, and left circumflex with no significant obstruction. 4.  Normal right heart pressures   Recommendations: Continued evaluation for aortic valve replacement and ascending aortic aneurysm repair.  Recommend CTA studies followed by surgical consultation.   Brain MRI 05/21/2023 FINDINGS: Brain: Are numerous small foci of diffusion restriction in the left cerebellar hemisphere and both cerebral hemispheres in the MCA and PCA distributions consistent with small acute infarcts, likely embolic in etiology. There is no associated hemorrhage or mass effect.   There is no acute intracranial hemorrhage or extra-axial fluid collection   Background parenchymal volume is normal. The ventricles are normal in size. There is a minimal background chronic small-vessel ischemic change. There are scattered punctate foci of SWI signal dropout which are nonspecific but may be related to prior emboli.   The pituitary and suprasellar region are normal. There is no mass lesion. There is no mass effect or midline shift.   Vascular: Normal flow voids.   Skull and upper cervical spine: Normal marrow signal.   Sinuses/Orbits: There is mild mucosal thickening in the paranasal sinuses. The globes and orbits are unremarkable.   Other: The mastoid air cells and middle ear cavities are clear.   IMPRESSION: Scattered small acute infarcts in the left cerebellar hemisphere and both cerebral hemispheres, likely embolic in etiology.   Patient Profile     67 y.o. male with PMH of CAD with CTO of RCA, HTN, HLD, PSVT, RCC s/p partial R nephrectomy 2013 and severe aortic stenosis who underwent AVR and aortic root replacement. Cardiology consulted for postop afib with RVR. Placed on amiodarone. MRI of the brain showed small scattered acute  CVA  Assessment & Plan    Postop atrial fibrillation  - occurred on post op day #3  - placed on IV amiodarone with plan to consider 30 days outpatient monitor and hold off on anticoagulation, however same brain MRI showed scattered small acute CVA.  Seen by neurology, coumadin started.  - very unusual to have clot formation and emboli with only 2.5 hours of afib. I have personally reviewed on telemetry from 7/1 until today, no other episode of afib. Discussed with MD, there is a possibility he has been having PAF lately and already has a clot formed even prior to surgery. Either way, he no longer requires a outpatient heart monitor as he will likely be on coumadin long term.    Acute CVA: scattered small acute infarcts in the left cerebellar hemisphere and both cerebral hemispheres, likely embolic. Seen by neurology service who recommended anticoagulation. Coumadin started  Severe aortic stenosis s/p AVR and aortic root replacement. TTE in 6 weeks, SBE prophylaxis  Postop anemia: hgb 8.2 this morning, stable.  HTN  HLD  For questions or updates, please contact [facility] Please consult [email or web address] for contact info under  Signed, Azalee Course, PA  05/22/2023, 9:41 AM    Patient seen and examined, note reviewed with the signed Advanced Practice Provider. I personally reviewed laboratory data, imaging studies and relevant notes. I independently examined the patient and formulated the important aspects of the plan. I have personally discussed the plan with the patient and/or family. Comments or changes to the note/plan are indicated below.  Postop atrial fibrillation Acute CVA Severe aortic stenosis status post AVR Hypertension Hyperlipidemia  He has been started on anticoagulation by neurology, I do agree with this. Please continue to Coumadin dosing. Hemoglobin stable today 8.2 we will continue to monitor. Blood pressure is stable. Continue statin   Thomasene Ripple DO, MS  [facility] Attending Cardiologist [facility]  [address] [place], [place] [address] [phone] Website: [email or web address]

## 2023-05-22 NOTE — Progress Notes (Signed)
4 Days Post-Op Procedure(s) (LRB): AORTIC VALVE REPLACEMENT (AVR) USING INSPIRIS RESILIA 23 MM AORTIC VALVE (N/A) REPLACEMENT OF ASCENDING AORTIC ANEURYSM USING A 30 X 10 MM HEMASHIELD PLATINUM GRAFT (N/A) TRANSESOPHAGEAL ECHOCARDIOGRAM (N/A) Subjective: Feels ok. No appetite. Bowels moved yesterday. Ambulating.   Objective: Vital signs in last 24 hours: Temp:  [97.8 F (36.6 C)-98.4 F (36.9 C)] 97.8 F (36.6 C) (07/05 0539) Pulse Rate:  [63-88] 78 (07/05 0539) Cardiac Rhythm: Normal sinus rhythm (07/04 2100) Resp:  [13-20] 16 (07/05 0539) BP: (106-134)/(68-75) 134/75 (07/05 0539) SpO2:  [96 %-98 %] 98 % (07/04 2317) Weight:  [69.6 kg] 69.6 kg (07/05 0601)  Hemodynamic parameters for last 24 hours:    Intake/Output from previous day: 07/04 0701 - 07/05 0700 In: 557.2 [I.V.:557.2] Out: -  Intake/Output this shift: No intake/output data recorded.  General appearance: alert and cooperative Neurologic: RUE weakness, particularly grip. RLE seems close to normal to me Heart: regular rate and rhythm, S1, S2 normal, systolic flow murmur. Lungs: clear to auscultation bilaterally Extremities: no edema Wound: incision healing well  Lab Results: Recent Labs    05/20/23 0514 05/21/23 0551  WBC 11.3* 10.8*  HGB 8.2* 8.2*  HCT 24.6* 24.4*  PLT 97* 100*   BMET:  Recent Labs    05/20/23 0514 05/21/23 0551  NA 135 135  K 3.9 3.8  CL 103 104  CO2 24 24  GLUCOSE 132* 141*  BUN 22 26*  CREATININE 1.09 0.98  CALCIUM 8.2* 8.1*    PT/INR: No results for input(s): "LABPROT", "INR" in the last 72 hours. ABG    Component Value Date/Time   PHART 7.371 05/18/2023 2030   HCO3 16.6 (L) 05/18/2023 2030   TCO2 17 (L) 05/18/2023 2030   ACIDBASEDEF 8.0 (H) 05/18/2023 2030   O2SAT 99 05/18/2023 2030   CBG (last 3)  Recent Labs    05/20/23 0740 05/20/23 1137 05/20/23 1608  GLUCAP 142* 125* 117*    Assessment/Plan: S/P Procedure(s) (LRB): AORTIC VALVE REPLACEMENT (AVR)  USING INSPIRIS RESILIA 23 MM AORTIC VALVE (N/A) REPLACEMENT OF ASCENDING AORTIC ANEURYSM USING A 30 X 10 MM HEMASHIELD PLATINUM GRAFT (N/A) TRANSESOPHAGEAL ECHOCARDIOGRAM (N/A)  POD 4  Hemodynamically stable.  Postop atrial fib with small embolic strokes to left cerebellar hemisphere and both cerebral hemispheres with right hemiparesis. This is unusual after early postop atrial fib. Will switch IV amio to po. Continue Lopressor. Start Coumadin since no sign of bleeding.  PT/OT.   LOS: 4 days    [name] 05/22/2023

## 2023-05-22 NOTE — Progress Notes (Signed)
Physical Therapy Treatment Patient Details Name: [name] MRN: [id number] DOB: 1956-06-15 Today's Date: 05/22/2023   History of Present Illness Patient is a 67 y/o male admitted 05/18/23 with severe aortic stenosis Thoracic aortic aneurysm and underwent AVR and replacement of ascending aortic aneurysm and TTE. Pt with post op afib and on 7/4 pr with rt sided weakness and MRI showed scattered small embolic infarcts in bilateral hemispheres and left cerebellum. PMH positive for NSTEMI 2015 c/p cath, renal cell carcinoma s/p partial R nephrectomy 2013, severe degenerative spine disease s/p low back surgery in 80's and cervical disc surgery 2022.    PT Comments  Pt now with new CVA's impacting rt side. RLE weakness grossly 4/5. Decr coordination of RLE. Current goals appropriated. Pt able to amb some with assist in hall. Needs cues for sternal precautions. At baseline pt is independent and cares for his wife. Other family members are currently assisting pt's wife at home. Feel pt can make good progress and feel he will benefit from intensive inpatient follow up therapy, >3 hours/day      Assistance Recommended at Discharge Frequent or constant Supervision/Assistance  If plan is discharge home, recommend the following:  Can travel by private vehicle    A little help with walking and/or transfers;A little help with bathing/dressing/bathroom;Assistance with cooking/housework;Assist for transportation;Help with stairs or ramp for entrance      Equipment Recommendations  None recommended by PT    Recommendations for Other Services       Precautions / Restrictions Precautions Precautions: Fall;Sternal Restrictions RUE Weight Bearing: Non weight bearing LUE Weight Bearing: Non weight bearing     Mobility  Bed Mobility Overal bed mobility: Needs Assistance Bed Mobility: Rolling, Sidelying to Sit Rolling: Min guard Sidelying to sit: Min assist       General bed mobility comments:  Assist to elevate trunk into sitting. Verbal cues for sternal precautions    Transfers Overall transfer level: Needs assistance Equipment used: Rolling walker (2 wheels) Transfers: Sit to/from Stand Sit to Stand: Min assist           General transfer comment: Assist to power up and to place rt hand on walker. Repeated verbal cues for hands to knees for sternal precautions    Ambulation/Gait Ambulation/Gait assistance: Min assist Gait Distance (Feet): 125 Feet Assistive device: Rolling walker (2 wheels) Gait Pattern/deviations: Step-through pattern, Decreased stride length, Decreased dorsiflexion - right Gait velocity: decr Gait velocity interpretation: 1.31 - 2.62 ft/sec, indicative of limited community ambulator   General Gait Details: Assist for balance and support. Decr clearance of rt foot. Rt knee kept in extension throughout stance   Stairs             Wheelchair Mobility     Tilt Bed    Modified Rankin (Stroke Patients Only)       Balance Overall balance assessment: Needs assistance Sitting-balance support: No upper extremity supported, Feet supported Sitting balance-Leahy Scale: Good     Standing balance support: Bilateral upper extremity supported Standing balance-Leahy Scale: Poor Standing balance comment: walker and min guard for static standing                            Cognition Arousal/Alertness: Awake/alert Behavior During Therapy: WFL for tasks assessed/performed Overall Cognitive Status: Within Functional Limits for tasks assessed  General Comments: Needs repeated cues for sternal precautions.        Exercises      General Comments General comments (skin integrity, edema, etc.): VSS on RA      Pertinent Vitals/Pain Pain Assessment Pain Assessment: No/denies pain    Home Living                          Prior Function            PT Goals (current goals  can now be found in the care plan section) Acute Rehab PT Goals Patient Stated Goal: to return to independent/care for his wife    Frequency    Min 1X/week      PT Plan Discharge plan needs to be updated    Co-evaluation              AM-PAC PT "6 Clicks" Mobility   Outcome Measure  Help needed turning from your back to your side while in a flat bed without using bedrails?: A Little Help needed moving from lying on your back to sitting on the side of a flat bed without using bedrails?: A Little Help needed moving to and from a bed to a chair (including a wheelchair)?: A Little Help needed standing up from a chair using your arms (e.g., wheelchair or bedside chair)?: A Little Help needed to walk in hospital room?: A Little Help needed climbing 3-5 steps with a railing? : Total 6 Click Score: 16    End of Session Equipment Utilized During Treatment: Gait belt Activity Tolerance: Patient tolerated treatment well Patient left: in chair;with call bell/phone within reach Nurse Communication: Mobility status (nurse tech) PT Visit Diagnosis: Other abnormalities of gait and mobility (R26.89);Muscle weakness (generalized) (M62.81);Hemiplegia and hemiparesis Hemiplegia - Right/Left: Right Hemiplegia - dominant/non-dominant: Dominant Hemiplegia - caused by: Cerebral infarction     Time: 1610-9604 PT Time Calculation (min) (ACUTE ONLY): 19 min  Charges:      PT General Charges $$ ACUTE PT VISIT: 1 Visit                     [facility] PT Acute Rehabilitation Services Office [phone]    Angelina Ok [facility] 05/22/2023, 3:20 PM

## 2023-05-23 ENCOUNTER — Inpatient Hospital Stay (HOSPITAL_COMMUNITY): Payer: Medicare Other

## 2023-05-23 DIAGNOSIS — I639 Cerebral infarction, unspecified: Secondary | ICD-10-CM

## 2023-05-23 LAB — CBC
HCT: 25.1 % — ABNORMAL LOW (ref 39.0–52.0)
Hemoglobin: 8.1 g/dL — ABNORMAL LOW (ref 13.0–17.0)
MCH: 30.2 pg (ref 26.0–34.0)
MCHC: 32.3 g/dL (ref 30.0–36.0)
MCV: 93.7 fL (ref 80.0–100.0)
Platelets: 175 10*3/uL (ref 150–400)
RBC: 2.68 MIL/uL — ABNORMAL LOW (ref 4.22–5.81)
RDW: 14.6 % (ref 11.5–15.5)
WBC: 7 10*3/uL (ref 4.0–10.5)
nRBC: 0 % (ref 0.0–0.2)

## 2023-05-23 LAB — COMPREHENSIVE METABOLIC PANEL
ALT: 33 U/L (ref 0–44)
AST: 31 U/L (ref 15–41)
Albumin: 3 g/dL — ABNORMAL LOW (ref 3.5–5.0)
Alkaline Phosphatase: 53 U/L (ref 38–126)
Anion gap: 16 — ABNORMAL HIGH (ref 5–15)
BUN: 26 mg/dL — ABNORMAL HIGH (ref 8–23)
CO2: 25 mmol/L (ref 22–32)
Calcium: 8.7 mg/dL — ABNORMAL LOW (ref 8.9–10.3)
Chloride: 99 mmol/L (ref 98–111)
Creatinine, Ser: 0.84 mg/dL (ref 0.61–1.24)
GFR, Estimated: >60 mL/min (ref >60)
Glucose, Bld: 143 mg/dL — ABNORMAL HIGH (ref 70–99)
Potassium: 3.5 mmol/L (ref 3.5–5.1)
Sodium: 140 mmol/L (ref 135–145)
Total Bilirubin: 1.1 mg/dL (ref 0.3–1.2)
Total Protein: 5.7 g/dL — ABNORMAL LOW (ref 6.5–8.1)

## 2023-05-23 LAB — PROTIME-INR
INR: 1.3 — ABNORMAL HIGH (ref 0.8–1.2)
Prothrombin Time: 16.3 seconds — ABNORMAL HIGH (ref 11.4–15.2)

## 2023-05-23 MED ORDER — WARFARIN SODIUM 5 MG PO TABS
5.0000 mg | ORAL_TABLET | Freq: Once | ORAL | Status: AC
  Administered 2023-05-23: 5 mg via ORAL
  Filled 2023-05-23: qty 1

## 2023-05-23 NOTE — Progress Notes (Signed)
CARDIAC REHAB PHASE I   PRE:  Rate/Rhythm: 86 SR    BP: sitting 107/77    SpO2: 99 RA  MODE:  Ambulation: 150 ft   POST:  Rate/Rhythm: 105 ST with PVC    BP: sitting 135/83     SpO2: 93 RA  Pt able to roll to his right side but required mod assist to sit up. Would probably be more independent getting out of bed on his left side. Stood independently and walked with RW and gait belt min assist. Pt with good awareness of right leg deficits. Kept knee extended, slow pace, watched for lack of clearance for foot or RW leg. Fatigue with distance, to recliner. VSS. Encouraged recliner, walks, IS. Pt motivated.  1610-9604   Ethelda Chick BS, ACSM-CEP 05/23/2023 9:31 AM

## 2023-05-23 NOTE — Progress Notes (Signed)
STROKE TEAM PROGRESS NOTE   BRIEF HPI Mr. [name] is a 67 y.o. male with history of rhinitis, hypertension, hyperlipidemia, non-STEMI and recent aortic valve replacement with aortic root replacement on 7/1 with postoperative A-fib presenting with right-sided weakness.  MRI brain revealed scattered small embolic infarcts in bilateral hemispheres and left cerebellum.    SIGNIFICANT HOSPITAL EVENTS 7/1 AVR, postoperative A-fib  INTERIM HISTORY/SUBJECTIVE No family at bedside.  Patient sitting in chair, having lunch.  Still has mild right facial droop and right upper extremity weakness.  But improving.  Currently on aspirin and Coumadin.  OBJECTIVE  CBC    Component Value Date/Time   WBC 7.0 05/23/2023 0815   RBC 2.68 (L) 05/23/2023 0815   HGB 8.1 (L) 05/23/2023 0815   HGB 14.6 03/23/2023 1236   HCT 25.1 (L) 05/23/2023 0815   HCT 44.7 03/23/2023 1236   PLT 175 05/23/2023 0815   PLT 240 03/23/2023 1236   MCV 93.7 05/23/2023 0815   MCV 92 03/23/2023 1236   MCH 30.2 05/23/2023 0815   MCHC 32.3 05/23/2023 0815   RDW 14.6 05/23/2023 0815   RDW 13.1 03/23/2023 1236   LYMPHSABS 1.6 04/08/2014 0447   MONOABS 0.4 04/08/2014 0447   EOSABS 0.2 04/08/2014 0447   BASOSABS 0.0 04/08/2014 0447    BMET    Component Value Date/Time   NA 140 05/23/2023 0815   NA 141 03/23/2023 1236   K 3.5 05/23/2023 0815   CL 99 05/23/2023 0815   CO2 25 05/23/2023 0815   GLUCOSE 143 (H) 05/23/2023 0815   BUN 26 (H) 05/23/2023 0815   BUN 14 03/23/2023 1236   CREATININE 0.84 05/23/2023 0815   CREATININE 0.94 09/20/2020 0820   CALCIUM 8.7 (L) 05/23/2023 0815   EGFR 94 03/23/2023 1236   GFRNONAA >60 05/23/2023 0815    IMAGING past 24 hours CT ANGIO HEAD NECK W WO CM  Result Date: 05/22/2023 CLINICAL DATA:  Neuro deficit, acute, stroke suspected EXAM: CT ANGIOGRAPHY HEAD AND NECK WITH AND WITHOUT CONTRAST TECHNIQUE: Multidetector CT imaging of the head and neck was performed using the standard  protocol during bolus administration of intravenous contrast. Multiplanar CT image reconstructions and MIPs were obtained to evaluate the vascular anatomy. Carotid stenosis measurements (when applicable) are obtained utilizing NASCET criteria, using the distal internal carotid diameter as the denominator. RADIATION DOSE REDUCTION: This exam was performed according to the departmental dose-optimization program which includes automated exposure control, adjustment of the mA and/or kV according to patient size and/or use of iterative reconstruction technique. CONTRAST:  75mL OMNIPAQUE IOHEXOL 350 MG/ML SOLN COMPARISON:  MRI head 05/21/2023. FINDINGS: CT HEAD FINDINGS Brain: Known small acute infarcts better characterized on recent MRI head. No evidence of progressive mass effect or acute hemorrhage. No midline shift or hydrocephalus. No mass lesion. Vascular: See below. Skull: No acute fracture. Sinuses/Orbits: Inferior right greater left maxillary sinus mucosal thickening. No acute orbital findings. Other: No mastoid effusions. Review of the MIP images confirms the above findings CTA NECK FINDINGS Aortic arch: Great vessel origins are patent.  Atherosclerosis. Right carotid system: Atherosclerosis at the carotid bifurcation without greater than 50% stenosis. Left carotid system: Atherosclerosis at the carotid bifurcation without greater than 50% stenosis. Vertebral arteries: The vertebral arteries are patent bilaterally. Left dominant. Moderate left vertebral artery origin stenosis. Skeleton: No acute abnormality on limited assessment. Solid C4-C5 ACDF. Other neck: No acute abnormality on limited assessment. Upper chest: Small partially imaged layering pleural effusions bilaterally. Lung apices are otherwise  clear. Review of the MIP images confirms the above findings CTA HEAD FINDINGS Anterior circulation: Bilateral intracranial ICAs MCAs and ACAs are patent. Moderate bilateral paraclinoid ICA stenosis due to  atherosclerosis. No aneurysm identified. Posterior circulation: Bilateral intradural vertebral arteries are patent. The right non dominant/small intradural vertebral artery largely terminates as PICA, anatomic variant. Basilar artery and bilateral posterior cerebral arteries are patent without proximal hemodynamically significant stenosis. Venous sinuses: As permitted by contrast timing, patent. Anatomic variants: Detailed above. Review of the MIP images confirms the above findings IMPRESSION: 1. No large vessel occlusion. 2. Moderate bilateral paraclinoid ICA stenosis. 3. Moder no large ate left vertebral artery origin stenosis. Electronically Signed   By: Feliberto Harts M.D.   On: 05/22/2023 16:11    Vitals:   05/22/23 1947 05/22/23 1957 05/22/23 2333 05/23/23 0449  BP: 134/76  115/69 (!) 144/79  Pulse: 88  71 93  Resp: 18  19 18   Temp: 99.3 F (37.4 C)  98.1 F (36.7 C) 98.1 F (36.7 C)  TempSrc: Oral  Oral Oral  SpO2: 100% 98% 98% 98%  Weight:    71.4 kg  Height:         PHYSICAL EXAM General:  Alert, well-nourished, well-developed patient, mildly anxious Psych:  Mood and affect appropriate for situation Respiratory:  Regular, unlabored respirations on room air NEURO:  Neuro - awake, alert, eyes open, orientated to age, place, time. No aphasia, fluent language, following all simple commands. Able to name and repeat. No gaze palsy, tracking bilaterally, visual field full, PERRL. Mild right facial droop. Tongue midline. Left UE 5/5, RUE proximal 4+/5, distal 3/5, decreased hand grip and finger dexterity. Sensation symmetrical bilaterally, b/l FTN intact although slow on the right, gait not tested.     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  CTA head & neck bilateral siphon moderate stenosis, left VA origin moderate stenosis MRI scattered  small acute infarcts in bilateral cerebral hemispheres and left cerebellum, likely embolic TEE EF 50% with small PFO LE venous doppler no DVT LDL 28 HgbA1c 5.8 VTE prophylaxis - coumadin aspirin 81 mg daily prior to admission, now on aspirin 81 mg daily and coumadin. Further antithrombotic regimen with cardiology Therapy recommendations:  Home Health PT Disposition: Pending  Postoperative Atrial fibrillation AF found post AVR and aortic root replacement Cardiology on board Continue telemetry monitoring On coumadin per cardiology  Hypertension Home meds: Amlodipine 5 mg daily, losartan 100 mg daily, metoprolol XL 25 mg daily Stable Maintain normotension  Hyperlipidemia Home meds: Atorvastatin 80 mg daily, resumed in hospital LDL 28, goal < 70 Continue statin at discharge  Other Stroke Risk Factors History of Non-STEMI   Other Active Problems Mild leukocytosis, WBC 10.8-7.0 Mild thrombocytopenia platelet 100-175  Hospital day # 5  Neurology will sign off. Please call with questions. Pt will follow up with Dr. Epimenio Foot at [facility] in about 4 weeks. Thanks for the consult.  Marvel Plan, MD PhD Stroke Neurology 05/23/2023 6:54 PM    To contact Stroke Continuity provider, please refer to [email or web address]. After hours, contact General Neurology

## 2023-05-23 NOTE — Progress Notes (Signed)
VASCULAR LAB    Bilateral lower extremity venous duplex has been performed.  See CV proc for preliminary results.   Mellanie Bejarano, RVT 05/23/2023, 12:25 PM

## 2023-05-23 NOTE — Plan of Care (Signed)
  Problem: Education: Goal: Knowledge of General Education information will improve Description: Including pain rating scale, medication(s)/side effects and non-pharmacologic comfort measures Outcome: Progressing   Problem: Health Behavior/Discharge Planning: Goal: Ability to manage health-related needs will improve Outcome: Progressing   Problem: Clinical Measurements: Goal: Ability to maintain clinical measurements within normal limits will improve Outcome: Progressing Goal: Will remain free from infection Outcome: Progressing Goal: Diagnostic test results will improve Outcome: Progressing Goal: Respiratory complications will improve Outcome: Progressing Goal: Cardiovascular complication will be avoided Outcome: Progressing   Problem: Activity: Goal: Risk for activity intolerance will decrease Outcome: Progressing   Problem: Nutrition: Goal: Adequate nutrition will be maintained Outcome: Progressing   Problem: Coping: Goal: Level of anxiety will decrease Outcome: Progressing   Problem: Elimination: Goal: Will not experience complications related to bowel motility Outcome: Progressing Goal: Will not experience complications related to urinary retention Outcome: Progressing   Problem: Pain Managment: Goal: General experience of comfort will improve Outcome: Progressing   Problem: Safety: Goal: Ability to remain free from injury will improve Outcome: Progressing   Problem: Skin Integrity: Goal: Risk for impaired skin integrity will decrease Outcome: Progressing   Problem: Education: Goal: Will demonstrate proper wound care and an understanding of methods to prevent future damage Outcome: Progressing Goal: Knowledge of disease or condition will improve Outcome: Progressing Goal: Knowledge of the prescribed therapeutic regimen will improve Outcome: Progressing Goal: Individualized Educational Video(s) Outcome: Progressing   Problem: Activity: Goal: Risk for  activity intolerance will decrease Outcome: Progressing   Problem: Cardiac: Goal: Will achieve and/or maintain hemodynamic stability Outcome: Progressing   Problem: Clinical Measurements: Goal: Postoperative complications will be avoided or minimized Outcome: Progressing   Problem: Respiratory: Goal: Respiratory status will improve Outcome: Progressing   Problem: Skin Integrity: Goal: Wound healing without signs and symptoms of infection Outcome: Progressing Goal: Risk for impaired skin integrity will decrease Outcome: Progressing   Problem: Urinary Elimination: Goal: Ability to achieve and maintain adequate renal perfusion and functioning will improve Outcome: Progressing   Problem: Education: Goal: Knowledge of disease or condition will improve Outcome: Progressing Goal: Knowledge of secondary prevention will improve (MUST DOCUMENT ALL) Outcome: Progressing Goal: Knowledge of patient specific risk factors will improve Loraine Leriche N/A or DELETE if not current risk factor) Outcome: Progressing   Problem: Ischemic Stroke/TIA Tissue Perfusion: Goal: Complications of ischemic stroke/TIA will be minimized Outcome: Progressing   Problem: Coping: Goal: Will verbalize positive feelings about self Outcome: Progressing Goal: Will identify appropriate support needs Outcome: Progressing   Problem: Health Behavior/Discharge Planning: Goal: Ability to manage health-related needs will improve Outcome: Progressing Goal: Goals will be collaboratively established with patient/family Outcome: Progressing   Problem: Self-Care: Goal: Ability to participate in self-care as condition permits will improve Outcome: Progressing Goal: Verbalization of feelings and concerns over difficulty with self-care will improve Outcome: Progressing Goal: Ability to communicate needs accurately will improve Outcome: Progressing

## 2023-05-23 NOTE — Evaluation (Signed)
Speech Language Pathology Evaluation Patient Details Name: [name] MRN: [id number] DOB: Dec 11, 1955 Today's Date: 05/23/2023 Time: 1000-1030 SLP Time Calculation (min) (ACUTE ONLY): 30 min  Problem List:  Patient Active Problem List   Diagnosis Date Noted   S/P AVR (aortic valve replacement) 05/18/2023   Chronic cough 02/20/2023   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 [facility]) 02/15/2021   Right leg weakness 02/07/2021   Allodynia 02/07/2021   Gait disturbance 02/07/2021   White matter abnormality on MRI of brain 02/07/2021   Hyperreflexia 02/07/2021   Special screening for malignant neoplasms, colon    Adenomatous polyp of transverse colon    Diverticulosis of large intestine without diverticulitis    PSVT (paroxysmal supraventricular tachycardia) 05/29/2014   Precordial pain 04/19/2014   Coronary atherosclerosis of native coronary artery 04/06/2014   Essential hypertension, benign 04/06/2014   HLD (hyperlipidemia) 04/06/2014   Past Medical History:  Past Medical History:  Diagnosis Date   Arthritis    Coronary atherosclerosis of native coronary artery    a. NSTEMI (02/2014):  LHC (02/2014):  Mild disease in LAD and CFX; prox RCA occluded with R-R collats, dist RCA filled by L-R collats, inf HK, EF 55%, LVEDP 15 mmHg.  PCI:  Unsuccessful angioplasty of RCA (late presentation of inf MI) - tx medically.   Essential hypertension    GERD (gastroesophageal reflux disease)    Hyperlipidemia    NSTEMI (non-ST elevated myocardial infarction) (HCC) 03/11/14   Renal cell carcinoma of right kidney (HCC)    Partial nephrectomy in 2013   Past Surgical History:  Past Surgical History:  Procedure Laterality Date   AORTIC VALVE REPLACEMENT N/A 05/18/2023   Procedure: AORTIC VALVE REPLACEMENT (AVR) USING INSPIRIS RESILIA 23 MM AORTIC VALVE;  Surgeon: Alleen Borne, MD;  Location: [facility] OR;  Service: Open Heart Surgery;  Laterality: N/A;   BACK SURGERY      1989   CERVICAL DISC SURGERY  04/29/2021   COLONOSCOPY N/A 12/11/2020   Procedure: COLONOSCOPY;  Surgeon: Franky Macho, MD;  Location: [facility] ENDO SUITE;  Service: Gastroenterology;  Laterality: N/A;   LEFT HEART CATHETERIZATION WITH CORONARY ANGIOGRAM N/A 03/17/2014   Procedure: LEFT HEART CATHETERIZATION WITH CORONARY ANGIOGRAM;  Surgeon: Corky Crafts, MD;  Location: [facility] CATH LAB;  Service: Cardiovascular;  Laterality: N/A;   POLYPECTOMY  12/11/2020   Procedure: POLYPECTOMY;  Surgeon: Franky Macho, MD;  Location: [facility] ENDO SUITE;  Service: Gastroenterology;;   REPLACEMENT ASCENDING AORTA N/A 05/18/2023   Procedure: REPLACEMENT OF ASCENDING AORTIC ANEURYSM USING A 30 X 10 MM HEMASHIELD PLATINUM GRAFT;  Surgeon: Alleen Borne, MD;  Location: [facility] OR;  Service: Open Heart Surgery;  Laterality: N/A;  CIRC ARREST   RIGHT HEART CATH AND CORONARY ANGIOGRAPHY N/A 04/03/2023   Procedure: RIGHT HEART CATH AND CORONARY ANGIOGRAPHY;  Surgeon: Tonny Bollman, MD;  Location: [facility] INVASIVE CV LAB;  Service: Cardiovascular;  Laterality: N/A;   ROBOT ASSISTED LAPAROSCOPIC NEPHRECTOMY  01/14/2012   Procedure: ROBOTIC ASSISTED LAPAROSCOPIC NEPHRECTOMY;  Surgeon: Milford Cage, MD;  Location: [facility] ORS;  Service: Urology;  Laterality: Right;  Robot Laparoscopic Right Partial Nephrectomy     TEE WITHOUT CARDIOVERSION N/A 05/18/2023   Procedure: TRANSESOPHAGEAL ECHOCARDIOGRAM;  Surgeon: Alleen Borne, MD;  Location: [facility] OR;  Service: Open Heart Surgery;  Laterality: N/A;   TONSILLECTOMY     HPI:  The patient is a 67 year old gentleman with a history of hyperlipidemia, coronary artery disease status post NSTEMI in 2015  with catheterization showing an occluded proximal RCA with right to right collaterals filling the distal vessel that could not be opened, renal cell carcinoma status post partial right nephrectomy in 2013, severe degenerative spine disease, and newly diagnosed severe aortic stenosis and ascending  aortic aneurysm who was referred for consideration of surgical treatment.  He reports a several year history of low back pain and leg numbness, pain and weakness that he has seen multiple physicians for.  He had previous lower back surgery in the 1980s and a cervical disc surgery in 2022.  He reports generalized weakness and fatigue as well as some shortness of breath with activity.  He denies any chest pain or pressure.  He has had some dizziness and reports having blacked out a few times.  He has a lot of difficulty bending over due to profound weakness.  He had a 2D echocardiogram performed on 11/14/2022 due to a heart murmur which he said he had no prior knowledge of.  It showed an indeterminate number of cusps.  There is severe aortic stenosis with a mean gradient of 42 mmHg and a peak gradient of 66 mmHg.  Valve area by VTI was 0.74 cm with a dimensionless index of 0.26.  There is moderate aortic insufficiency.  Left ventricular ejection fraction was 55 to 60% with mild LVH and grade 1 diastolic dysfunction.  Cardiac catheterization on 04/03/2023 showed a known chronic total occlusion of the RCA with right to right collaterals filling the distal vessel.  There is nonobstructive plaquing in the left main, LAD, and left circumflex without obstruction.  Right heart pressures were normal.  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  Aortic valve calcium score was 2289.  There is a 5.1 x 5.0 cm fusiform ascending aortic aneurysm.   MRI BRAIN 05/21/23: IMPRESSION:  Scattered small acute infarcts in the left cerebellar hemisphere and  both cerebral hemispheres, likely embolic in etiology. ST consulted d/t pt's decreased cognition status.   Assessment / Plan / Recommendation Clinical Impression  Pt seen by ST for speech/language assessment d/t pt's declined cognition. Pt was upright in chair when ST entered his room. Pt presented to be in a depressed mood  d/t current health situation and worrying about being able to care for his wife. Pt was able to participate in conversation w/ ST and follow simple commands. Pt's oral mech skills appear to be [facility]. Pt was administered the SLUMS where he received a score of 19/26; however, pt's score may be impacted by internal factors (I.e. anxiety, depression). Pt was oriented x4 and deficits included inability to recall information after a short-timed delay and sustained attention task such as digit recall backwards. 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 Paragraph retention was 75% accurate. Recommend ongoing cognitive assessment d/t internal distractions potentially impacting performance. ST will continue to follow at acute care level to address cognitive deficits.     SLP Assessment  SLP Recommendation/Assessment: Patient needs continued Speech Lanaguage Pathology Services SLP Visit Diagnosis: Cognitive communication deficit (R41.841)    Recommendations for follow up therapy are one component of a multi-disciplinary discharge planning process, led by the attending physician.  Recommendations may be updated based on patient status, additional functional criteria and insurance authorization.    Follow Up Recommendations  Acute inpatient rehab (3hours/day)    Assistance Recommended at Discharge  PRN  Functional Status Assessment  Frequency and Duration min 1 x/week  1 week      SLP Evaluation Cognition  Overall Cognitive Status: Impaired/Different from baseline Arousal/Alertness: Awake/alert Orientation Level: Oriented X4 Attention: Sustained;Selective Memory: Impaired Memory Impairment: Decreased recall of new information Awareness: Appears intact Problem Solving: Appears intact Behaviors: Other (comment) (Anxiety; potential/tendancy depression regarding life changes) Safety/Judgment: Appears intact       Comprehension  Auditory  Comprehension Yes/No Questions: Within Functional Limits Commands: Within Functional Limits Conversation: Complex Interfering Components: Anxiety Visual Recognition/Discrimination Discrimination: Within Function Limits Reading Comprehension Reading Status: Not tested    Expression Expression Primary Mode of Expression: Verbal Verbal Expression Overall Verbal Expression: Appears within functional limits for tasks assessed Initiation: No impairment Automatic Speech: Social Response Level of Generative/Spontaneous Verbalization: Conversation Repetition: No impairment Naming: No impairment Pragmatics: No impairment Written Expression Dominant Hand: Right Written Expression: Unable to assess (comment)   Oral / Motor  Oral Motor/Sensory Function Overall Oral Motor/Sensory Function: Within functional limits Motor Speech Overall Motor Speech: Appears within functional limits for tasks assessed Respiration: Within functional limits Phonation: Normal Resonance: Within functional limits Articulation: Within functional limitis Intelligibility: Intelligible Motor Planning: Witnin functional limits            Pat Adley Castello,M.S., CCC-SLP  05/23/2023, 5:37 PM

## 2023-05-23 NOTE — Progress Notes (Addendum)
[address]       [place] [address]             [phone]      5 Days Post-Op Procedure(s) (LRB): AORTIC VALVE REPLACEMENT (AVR) USING INSPIRIS RESILIA 23 MM AORTIC VALVE (N/A) REPLACEMENT OF ASCENDING AORTIC ANEURYSM USING A 30 X 10 MM HEMASHIELD PLATINUM GRAFT (N/A) TRANSESOPHAGEAL ECHOCARDIOGRAM (N/A)  Subjective:  Sitting up in chair.  No specific complaints.  Not requiring pain medication.  Not eating much due to food not tasting very good.  Denies difficulty with swallowing.  + BM, + ambulation  Objective: Vital signs in last 24 hours: Temp:  [98.1 F (36.7 C)-99.3 F (37.4 C)] 98.1 F (36.7 C) (07/06 0449) Pulse Rate:  [71-93] 93 (07/06 0449) Cardiac Rhythm: Normal sinus rhythm (07/05 1903) Resp:  [13-19] 18 (07/06 0449) BP: (114-144)/(69-79) 144/79 (07/06 0449) SpO2:  [98 %-100 %] 98 % (07/06 0449) Weight:  [71.4 kg] 71.4 kg (07/06 0449)  Intake/Output from previous day: 07/05 0701 - 07/06 0700 In: 756.4 [P.O.:720; I.V.:36.4] Out: -   General appearance: alert, cooperative, and no distress Heart: regular rate and rhythm Lungs: clear to auscultation bilaterally Abdomen: soft, non-tender; bowel sounds normal; no masses,  no organomegaly Extremities: extremities normal, atraumatic, no cyanosis or edema Wound: clean and dry Neuro:RUE weakness, RLE weakness minimal in comparison to LLE  Lab Results: Recent Labs    05/21/23 0551  WBC 10.8*  HGB 8.2*  HCT 24.4*  PLT 100*   BMET:  Recent Labs    05/21/23 0551  NA 135  K 3.8  CL 104  CO2 24  GLUCOSE 141*  BUN 26*  CREATININE 0.98  CALCIUM 8.1*    PT/INR:  Recent Labs    05/23/23 0116  LABPROT 16.3*  INR 1.3*   ABG    Component Value Date/Time   PHART 7.371 05/18/2023 2030   HCO3 16.6 (L) 05/18/2023 2030   TCO2 17 (L) 05/18/2023 2030   ACIDBASEDEF 8.0 (H) 05/18/2023 2030   O2SAT 99 05/18/2023 2030   CBG (last 3)  Recent Labs    05/20/23 0740 05/20/23 1137  05/20/23 1608  GLUCAP 142* 125* 117*    Assessment/Plan: S/P Procedure(s) (LRB): AORTIC VALVE REPLACEMENT (AVR) USING INSPIRIS RESILIA 23 MM AORTIC VALVE (N/A) REPLACEMENT OF ASCENDING AORTIC ANEURYSM USING A 30 X 10 MM HEMASHIELD PLATINUM GRAFT (N/A) TRANSESOPHAGEAL ECHOCARDIOGRAM (N/A)  CV- PAF, currently NSR with PVCs- continue Amiodarone, low dose Lopressor.. BP elevated this morning (SBP 144)- will monitor Pulm- no acute issues, does not require oxygen Renal-weight below baseline, no indication for Lasix GI- decreased oral intake, no evidence of swallowing dysfunction, continue to encourage oral intake Neuro- Microembolic strokes, likely felt to be related to Atrial Fibrillation, CTA of neck showed.. no large vessel occlusion, moderate bilateral paraclinoid ICA stenosis.Marland Kitchen on coumadin, INR at 1.3 today, repeat 5 mg this evening Deconditioning- mild RUE weakness due to recent Neuro event, RLE is minimally affected... PT/OT is working with patient, [name] has been recommended and patient is agreeable to this Dispo- patient stable, maintaining NSR with PVCs, continue coumadin in setting of recent microembolic stroke, [name] recommended for stroke rehab.. the patient is likely clinically ready for d/c  to [name] once insurance auth and bed can be obtained, hopefully in the next 24-48 hours depending INR and further progression.   LOS: 5 days    Lowella Dandy, PA-C 05/23/2023   Chart reviewed, patient examined, agree with above. He  feels better today and is medically stable. [name] recommended and he is in agreement. Insurance authorization pending.

## 2023-05-23 NOTE — Progress Notes (Signed)
Rounding Note    Patient Name: [name] Date of Encounter: 05/23/2023  [facility] Cardiologist: Nona Dell, MD   Subjective   Denies any SOB or chest pain Sitting in chair good breakfast   Inpatient Medications    Scheduled Meds:  acetaminophen  1,000 mg Oral Q6H   Or   acetaminophen (TYLENOL) oral liquid 160 mg/5 mL  1,000 mg Per Tube Q6H   amiodarone  400 mg Oral BID   aspirin EC  81 mg Oral Daily   Or   aspirin  81 mg Per Tube Daily   atorvastatin  80 mg Oral Daily   baclofen  10 mg Oral Daily   Chlorhexidine Gluconate Cloth  6 each Topical Daily   Fe Fum-Vit C-Vit B12-FA  1 capsule Oral QPC breakfast   finasteride  5 mg Oral Daily   metoprolol tartrate  12.5 mg Oral BID   Or   metoprolol tartrate  12.5 mg Per Tube BID   nutrition supplement (JUVEN)  1 packet Oral BID BM   pantoprazole  40 mg Oral Daily   sodium chloride flush  3 mL Intravenous Q12H   warfarin  5 mg Oral ONCE-1600   Warfarin - Physician Dosing Inpatient   Does not apply q1600   Continuous Infusions:  sodium chloride     PRN Meds: sodium chloride, metoprolol tartrate, ondansetron (ZOFRAN) IV, oxyCODONE, sodium chloride flush, traMADol   Vital Signs    Vitals:   05/22/23 1947 05/22/23 1957 05/22/23 2333 05/23/23 0449  BP: 134/76  115/69 (!) 144/79  Pulse: 88  71 93  Resp: 18  19 18   Temp: 99.3 F (37.4 C)  98.1 F (36.7 C) 98.1 F (36.7 C)  TempSrc: Oral  Oral Oral  SpO2: 100% 98% 98% 98%  Weight:    71.4 kg  Height:        Intake/Output Summary (Last 24 hours) at 05/23/2023 1005 Last data filed at 05/23/2023 0300 Gross per 24 hour  Intake 480 ml  Output --  Net 480 ml      05/23/2023    4:49 AM 05/22/2023    6:01 AM 05/20/2023    5:00 AM  Last 3 Weights  Weight (lbs) 157 lb 6.5 oz 153 lb 7 oz 175 lb 9.6 oz  Weight (kg) 71.4 kg 69.6 kg 79.652 kg      Telemetry    NSR, afib with RVR from 4:14AM until 6:53AM on 7/4, no recurrence since - Personally  Reviewed  ECG    Afib with RVR with ST depression in the lateral leads - Personally Reviewed  Physical Exam   GEN: No acute distress.   Neck: No JVD Cardiac: RRR, SEM through AVR no AR murmurs, rubs, or gallops.  Respiratory: Clear to auscultation bilaterally. GI: Soft, nontender, non-distended  MS: No edema; No deformity. Neuro:  has some RUE and RLE weakness  Psych: Normal affect   Labs    High Sensitivity Troponin:  No results for input(s): "TROPONINIHS" in the last 720 hours.   Chemistry Recent Labs  Lab 05/18/23 1945 05/18/23 2030 05/19/23 0500 05/19/23 1721 05/20/23 0514 05/21/23 0551 05/23/23 0815  NA 137   < > 135 133* 135 135 140  K 4.3   < > 4.5 4.2 3.9 3.8 3.5  CL 108  --  108 103 103 104 99  CO2 21*  --  20* 22 24 24 25   GLUCOSE 131*  --  122* 141* 132* 141* 143*  BUN 14  --  14 20 22  26* 26*  CREATININE 1.03  --  1.02 1.10 1.09 0.98 0.84  CALCIUM 7.6*  --  7.8* 8.0* 8.2* 8.1* 8.7*  MG 3.0*  --  2.3 2.3  --   --   --   PROT  --   --   --   --   --   --  5.7*  ALBUMIN  --   --   --   --   --   --  3.0*  AST  --   --   --   --   --   --  31  ALT  --   --   --   --   --   --  33  ALKPHOS  --   --   --   --   --   --  53  BILITOT  --   --   --   --   --   --  1.1  GFRNONAA >60  --  >60 >60 >60 >60 >60  ANIONGAP 8  --  7 8 8 7  16*   < > = values in this interval not displayed.    Lipids  Recent Labs  Lab 05/22/23 0115  CHOL 77  TRIG 88  HDL 31*  LDLCALC 28  CHOLHDL 2.5    Hematology Recent Labs  Lab 05/20/23 0514 05/21/23 0551 05/23/23 0815  WBC 11.3* 10.8* 7.0  RBC 2.65* 2.65* 2.68*  HGB 8.2* 8.2* 8.1*  HCT 24.6* 24.4* 25.1*  MCV 92.8 92.1 93.7  MCH 30.9 30.9 30.2  MCHC 33.3 33.6 32.3  RDW 14.4 14.3 14.6  PLT 97* 100* 175   Thyroid No results for input(s): "TSH", "FREET4" in the last 168 hours.  BNPNo results for input(s): "BNP", "PROBNP" in the last 168 hours.  DDimer No results for input(s): "DDIMER" in the last 168 hours.    Radiology    CT ANGIO HEAD NECK W WO CM  Result Date: 05/22/2023 CLINICAL DATA:  Neuro deficit, acute, stroke suspected EXAM: CT ANGIOGRAPHY HEAD AND NECK WITH AND WITHOUT CONTRAST TECHNIQUE: Multidetector CT imaging of the head and neck was performed using the standard protocol during bolus administration of intravenous contrast. Multiplanar CT image reconstructions and MIPs were obtained to evaluate the vascular anatomy. Carotid stenosis measurements (when applicable) are obtained utilizing NASCET criteria, using the distal internal carotid diameter as the denominator. RADIATION DOSE REDUCTION: This exam was performed according to the departmental dose-optimization program which includes automated exposure control, adjustment of the mA and/or kV according to patient size and/or use of iterative reconstruction technique. CONTRAST:  75mL OMNIPAQUE IOHEXOL 350 MG/ML SOLN COMPARISON:  MRI head 05/21/2023. FINDINGS: CT HEAD FINDINGS Brain: Known small acute infarcts better characterized on recent MRI head. No evidence of progressive mass effect or acute hemorrhage. No midline shift or hydrocephalus. No mass lesion. Vascular: See below. Skull: No acute fracture. Sinuses/Orbits: Inferior right greater left maxillary sinus mucosal thickening. No acute orbital findings. Other: No mastoid effusions. Review of the MIP images confirms the above findings CTA NECK FINDINGS Aortic arch: Great vessel origins are patent.  Atherosclerosis. Right carotid system: Atherosclerosis at the carotid bifurcation without greater than 50% stenosis. Left carotid system: Atherosclerosis at the carotid bifurcation without greater than 50% stenosis. Vertebral arteries: The vertebral arteries are patent bilaterally. Left dominant. Moderate left vertebral artery origin stenosis. Skeleton: No acute abnormality on limited assessment. Solid C4-C5 ACDF. Other neck: No acute  abnormality on limited assessment. Upper chest: Small partially imaged  layering pleural effusions bilaterally. Lung apices are otherwise clear. Review of the MIP images confirms the above findings CTA HEAD FINDINGS Anterior circulation: Bilateral intracranial ICAs MCAs and ACAs are patent. Moderate bilateral paraclinoid ICA stenosis due to atherosclerosis. No aneurysm identified. Posterior circulation: Bilateral intradural vertebral arteries are patent. The right non dominant/small intradural vertebral artery largely terminates as PICA, anatomic variant. Basilar artery and bilateral posterior cerebral arteries are patent without proximal hemodynamically significant stenosis. Venous sinuses: As permitted by contrast timing, patent. Anatomic variants: Detailed above. Review of the MIP images confirms the above findings IMPRESSION: 1. No large vessel occlusion. 2. Moderate bilateral paraclinoid ICA stenosis. 3. Moder no large ate left vertebral artery origin stenosis. Electronically Signed   By: Feliberto Harts M.D.   On: 05/22/2023 16:11   MR BRAIN WO CONTRAST  Result Date: 05/21/2023 CLINICAL DATA:  Stroke suspected. Status post aortic valve replacement. EXAM: MRI HEAD WITHOUT CONTRAST TECHNIQUE: Multiplanar, multiecho pulse sequences of the brain and surrounding structures were obtained without intravenous contrast. COMPARISON:  Same-day CT head FINDINGS: Brain: Are numerous small foci of diffusion restriction in the left cerebellar hemisphere and both cerebral hemispheres in the MCA and PCA distributions consistent with small acute infarcts, likely embolic in etiology. There is no associated hemorrhage or mass effect. There is no acute intracranial hemorrhage or extra-axial fluid collection Background parenchymal volume is normal. The ventricles are normal in size. There is a minimal background chronic small-vessel ischemic change. There are scattered punctate foci of SWI signal dropout which are nonspecific but may be related to prior emboli. The pituitary and suprasellar region  are normal. There is no mass lesion. There is no mass effect or midline shift. Vascular: Normal flow voids. Skull and upper cervical spine: Normal marrow signal. Sinuses/Orbits: There is mild mucosal thickening in the paranasal sinuses. The globes and orbits are unremarkable. Other: The mastoid air cells and middle ear cavities are clear. IMPRESSION: Scattered small acute infarcts in the left cerebellar hemisphere and both cerebral hemispheres, likely embolic in etiology. Electronically Signed   By: Lesia Hausen M.D.   On: 05/21/2023 16:53   CT HEAD WO CONTRAST ( )  Result Date: 05/21/2023 CLINICAL DATA:  Neuro deficit, acute, stroke suspected EXAM: CT HEAD WITHOUT CONTRAST TECHNIQUE: Contiguous axial images were obtained from the base of the skull through the vertex without intravenous contrast. RADIATION DOSE REDUCTION: This exam was performed according to the departmental dose-optimization program which includes automated exposure control, adjustment of the mA and/or kV according to patient size and/or use of iterative reconstruction technique. COMPARISON:  None Available. FINDINGS: Brain: No evidence of acute infarction, hemorrhage, hydrocephalus, extra-axial collection or mass lesion/mass effect. Vascular: No hyperdense vessel or unexpected calcification. Skull: Normal. Negative for fracture or focal lesion. Sinuses/Orbits: No middle ear or mastoid effusion. Mucosal thickening right maxillary sinus. Orbits are unremarkable. Other: None. IMPRESSION: No hemorrhage or CT evidence of an acute cortical infarct. If there is high clinical concern, further evaluation with brain MRI could considered. Electronically Signed   By: Lorenza Cambridge M.D.   On: 05/21/2023 10:39    Cardiac Studies   Echo 11/14/2022  1. Left ventricular ejection fraction, by estimation, is 55 to 60%. The  left ventricle has normal function. The left ventricle has no regional  wall motion abnormalities. There is mild left ventricular  hypertrophy.  Left ventricular diastolic parameters  are consistent with Grade I diastolic dysfunction (impaired relaxation).  The average  left ventricular global longitudinal strain is -19.0 %. The  global longitudinal strain is normal.   2. Right ventricular systolic function is normal. The right ventricular  size is normal. Tricuspid regurgitation signal is inadequate for assessing  PA pressure.   3. The mitral valve is normal in structure. Trivial mitral valve  regurgitation. No evidence of mitral stenosis.   4. The tricuspid valve is abnormal.   5. The aortic valve has an indeterminant number of cusps. Aortic valve  regurgitation is moderate. Severe aortic valve stenosis.   6. Limited visualization of the ascending aorta, the visualized parts are  at least moderately dilated. Consider additional imaging with CTA or MRA  to better evalute degree and extent of aortic aneurysm. Marland Kitchen Aortic  dilatation noted. There is mild  dilatation of the aortic root, measuring 42 mm. There is moderate  dilatation of the ascending aorta, measuring 48 mm.   7. The inferior vena cava is normal in size with greater than 50%  respiratory variability, suggesting right atrial pressure of 3 mmHg.    Cath 04/03/2023   Prox RCA lesion is 100% stenosed.   There is severe aortic valve stenosis.   1.  Known severe aortic stenosis by noninvasive assessment, likely bicuspid valve 2.  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  Diffuse nonobstructive plaquing in the left main, LAD, and left circumflex with no significant obstruction. 4.  Normal right heart pressures   Recommendations: Continued evaluation for aortic valve replacement and ascending aortic aneurysm repair.  Recommend CTA studies followed by surgical consultation.   Brain MRI 05/21/2023 FINDINGS: Brain: Are numerous small foci of diffusion restriction in the left cerebellar hemisphere and both cerebral hemispheres  in the MCA and PCA distributions consistent with small acute infarcts, likely embolic in etiology. There is no associated hemorrhage or mass effect.   There is no acute intracranial hemorrhage or extra-axial fluid collection   Background parenchymal volume is normal. The ventricles are normal in size. There is a minimal background chronic small-vessel ischemic change. There are scattered punctate foci of SWI signal dropout which are nonspecific but may be related to prior emboli.   The pituitary and suprasellar region are normal. There is no mass lesion. There is no mass effect or midline shift.   Vascular: Normal flow voids.   Skull and upper cervical spine: Normal marrow signal.   Sinuses/Orbits: There is mild mucosal thickening in the paranasal sinuses. The globes and orbits are unremarkable.   Other: The mastoid air cells and middle ear cavities are clear.   IMPRESSION: Scattered small acute infarcts in the left cerebellar hemisphere and both cerebral hemispheres, likely embolic in etiology.   Patient Profile     67 y.o. male with PMH of CAD with CTO of RCA, HTN, HLD, PSVT, RCC s/p partial R nephrectomy 2013 and severe aortic stenosis who underwent AVR and aortic root replacement. Cardiology consulted for postop afib with RVR. Placed on amiodarone. MRI of the brain showed small scattered acute CVA  Assessment & Plan    Postop atrial fibrillation  - occurred on post op day #3  - placed on IV amiodarone with plan to consider 30 days outpatient monitor and hold off on anticoagulation, however same brain MRI showed scattered small acute CVA. Seen by neurology, coumadin started.  - very unusual to have clot formation and emboli with only 2.5 hours of afib. 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 Seen by neurology service who recommended anticoagulation. Coumadin started  Severe aortic stenosis s/p AVR and aortic root replacement. TTE in 6 weeks, SBE prophylaxis  Postop anemia: hgb 8.1 this morning, stable.  HTN  HLD  Patient will need [name] on d/c ? Monday    Charlton Haws, MD  05/23/2023, 10:05 AM

## 2023-05-24 LAB — PROTIME-INR
INR: 2.6 — ABNORMAL HIGH (ref 0.8–1.2)
Prothrombin Time: 28.1 seconds — ABNORMAL HIGH (ref 11.4–15.2)

## 2023-05-24 MED ORDER — LACTULOSE 10 GM/15ML PO SOLN
20.0000 g | Freq: Every day | ORAL | Status: DC | PRN
  Filled 2023-05-24: qty 30

## 2023-05-24 MED ORDER — WARFARIN SODIUM 2.5 MG PO TABS
2.5000 mg | ORAL_TABLET | Freq: Every day | ORAL | Status: DC
  Administered 2023-05-24: 2.5 mg via ORAL
  Filled 2023-05-24: qty 1

## 2023-05-24 MED ORDER — BISACODYL 10 MG RE SUPP
10.0000 mg | Freq: Every day | RECTAL | Status: DC | PRN
  Administered 2023-05-24: 10 mg via RECTAL
  Filled 2023-05-24: qty 1

## 2023-05-24 MED ORDER — LACTULOSE 10 GM/15ML PO SOLN
20.0000 g | Freq: Once | ORAL | Status: AC
  Administered 2023-05-24: 20 g via ORAL
  Filled 2023-05-24: qty 30

## 2023-05-24 NOTE — Progress Notes (Addendum)
      [address]       [place] [address]             [phone]      6 Days Post-Op Procedure(s) (LRB): AORTIC VALVE REPLACEMENT (AVR) USING INSPIRIS RESILIA 23 MM AORTIC VALVE (N/A) REPLACEMENT OF ASCENDING AORTIC ANEURYSM USING A 30 X 10 MM HEMASHIELD PLATINUM GRAFT (N/A) TRANSESOPHAGEAL ECHOCARDIOGRAM (N/A)  Subjective:  Patient sitting up in chair.  Having difficulty moving his bowels, chronic issue for him.  He is requesting a suppository.  Objective: Vital signs in last 24 hours: Temp:  [97.9 F (36.6 C)-98.4 F (36.9 C)] 98.3 F (36.8 C) (07/07 0415) Pulse Rate:  [72-90] 84 (07/07 0720) Cardiac Rhythm: Normal sinus rhythm (07/06 2008) Resp:  [14-20] 16 (07/07 0720) BP: (108-135)/(64-87) 135/87 (07/07 0720) SpO2:  [98 %-100 %] 99 % (07/07 0720) Weight:  [74.8 kg] 74.8 kg (07/07 0546)  General appearance: alert, cooperative, and no distress Heart: regular rate and rhythm Lungs: clear to auscultation bilaterally Abdomen: soft, non-tender; bowel sounds normal; no masses,  no organomegaly Extremities: RUE weakness Wound: clean and dry  Lab Results: Recent Labs    05/23/23 0815  WBC 7.0  HGB 8.1*  HCT 25.1*  PLT 175   BMET:  Recent Labs    05/23/23 0815  NA 140  K 3.5  CL 99  CO2 25  GLUCOSE 143*  BUN 26*  CREATININE 0.84  CALCIUM 8.7*    PT/INR:  Recent Labs    05/24/23 0128  LABPROT 28.1*  INR 2.6*   ABG    Component Value Date/Time   PHART 7.371 05/18/2023 2030   HCO3 16.6 (L) 05/18/2023 2030   TCO2 17 (L) 05/18/2023 2030   ACIDBASEDEF 8.0 (H) 05/18/2023 2030   O2SAT 99 05/18/2023 2030   CBG (last 3)  No results for input(s): "GLUCAP" in the last 72 hours.  Assessment/Plan: S/P Procedure(s) (LRB): AORTIC VALVE REPLACEMENT (AVR) USING INSPIRIS RESILIA 23 MM AORTIC VALVE (N/A) REPLACEMENT OF ASCENDING AORTIC ANEURYSM USING A 30 X 10 MM HEMASHIELD PLATINUM GRAFT (N/A) TRANSESOPHAGEAL ECHOCARDIOGRAM (N/A)  CV-  PAF, maintaining NSR- on Amiodarone, Lopressor INR big increase from 1.3 to 2.5 as discussed with Dr. Laneta Simmers will hold coumadin today, restart at 2.5 mg on Monday Pulm- no acute issues, off oxygen, continue IS use GI- constipation, chronic- dulcolax suppository ordered, lactulose prn Neuro- microembolic source, venous duplex negative for DVT, Echocardiogram has been ordered.. Neurology has signed off Deconditioning- due to right side deficits from stroke, SLP evaluation, PT/OT recommending [name].Marland Kitchen awaiting formal consult, insurance authorization.. Dispo- patient stable, maintaining NSR, will decrease coumadin with big increase in INR to 2.5 mg daily, LOC for constipation, Echocardiogram ordered by Cardiology is pending... awaiting [name] evaluation/insurance authorization.. he is stable for d/c to [name] once this can be arranged   LOS: 6 days    Lowella Dandy, PA-C 05/24/2023   Chart reviewed, patient examined, agree with above. He looks good. INR jumped to 2.5. will hold Coumadin tonight and resume 2.5 mg tomorrow. Lactulose today for constipation. Awaiting decision on [name].

## 2023-05-24 NOTE — Progress Notes (Signed)
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 Waiting on breakfast   Inpatient Medications    Scheduled Meds:  amiodarone  400 mg Oral BID   aspirin EC  81 mg Oral Daily   Or   aspirin  81 mg Per Tube Daily   atorvastatin  80 mg Oral Daily   baclofen  10 mg Oral Daily   Chlorhexidine Gluconate Cloth  6 each Topical Daily   Fe Fum-Vit C-Vit B12-FA  1 capsule Oral QPC breakfast   finasteride  5 mg Oral Daily   metoprolol tartrate  12.5 mg Oral BID   Or   metoprolol tartrate  12.5 mg Per Tube BID   nutrition supplement (JUVEN)  1 packet Oral BID BM   pantoprazole  40 mg Oral Daily   sodium chloride flush  3 mL Intravenous Q12H   warfarin  2.5 mg Oral Daily   Warfarin - Physician Dosing Inpatient   Does not apply q1600   Continuous Infusions:  sodium chloride     PRN Meds: sodium chloride, bisacodyl, lactulose, metoprolol tartrate, ondansetron (ZOFRAN) IV, oxyCODONE, sodium chloride flush, traMADol   Vital Signs    Vitals:   05/23/23 2355 05/24/23 0415 05/24/23 0546 05/24/23 0720  BP: 115/75 116/74  135/87  Pulse: 79 72  84  Resp: 14 16 18 16   Temp: 98 F (36.7 C) 98.3 F (36.8 C)    TempSrc: Oral Oral    SpO2: 100% 100%  99%  Weight:   74.8 kg   Height:       No intake or output data in the 24 hours ending 05/24/23 0823     05/24/2023    5:46 AM 05/23/2023    4:49 AM 05/22/2023    6:01 AM  Last 3 Weights  Weight (lbs) 164 lb 14.5 oz 157 lb 6.5 oz 153 lb 7 oz  Weight (kg) 74.8 kg 71.4 kg 69.6 kg      Telemetry    NSR, afib with RVR from 4:14AM until 6:53AM on 7/4, no recurrence since - Personally Reviewed  ECG    Afib with RVR with ST depression in the lateral leads - Personally Reviewed  Physical Exam   GEN: No acute distress.   Neck: No JVD Cardiac: RRR, SEM through AVR no AR  murmurs, rubs, or gallops.  Respiratory: Clear to auscultation bilaterally. GI: Soft, nontender, non-distended  MS: No edema; No deformity. Neuro:  has some RUE and RLE weakness  Psych: Normal affect   Labs    High Sensitivity Troponin:  No results for input(s): "TROPONINIHS" in the last 720 hours.   Chemistry Recent Labs  Lab 05/18/23 1945 05/18/23 2030 05/19/23 0500 05/19/23 1721 05/20/23 0514 05/21/23 0551 05/23/23 0815  NA 137   < > 135 133* 135 135 140  K 4.3   < > 4.5 4.2 3.9 3.8 3.5  CL 108  --  108 103 103 104 99  CO2 21*  --  20* 22 24 24 25   GLUCOSE 131*  --  122* 141* 132* 141* 143*  BUN 14  --  14 20 22  26* 26*  CREATININE 1.03  --  1.02 1.10 1.09 0.98 0.84  CALCIUM 7.6*  --  7.8* 8.0* 8.2* 8.1* 8.7*  MG 3.0*  --  2.3 2.3  --   --   --  PROT  --   --   --   --   --   --  5.7*  ALBUMIN  --   --   --   --   --   --  3.0*  AST  --   --   --   --   --   --  31  ALT  --   --   --   --   --   --  33  ALKPHOS  --   --   --   --   --   --  53  BILITOT  --   --   --   --   --   --  1.1  GFRNONAA >60  --  >60 >60 >60 >60 >60  ANIONGAP 8  --  7 8 8 7  16*   < > = values in this interval not displayed.    Lipids  Recent Labs  Lab 05/22/23 0115  CHOL 77  TRIG 88  HDL 31*  LDLCALC 28  CHOLHDL 2.5    Hematology Recent Labs  Lab 05/20/23 0514 05/21/23 0551 05/23/23 0815  WBC 11.3* 10.8* 7.0  RBC 2.65* 2.65* 2.68*  HGB 8.2* 8.2* 8.1*  HCT 24.6* 24.4* 25.1*  MCV 92.8 92.1 93.7  MCH 30.9 30.9 30.2  MCHC 33.3 33.6 32.3  RDW 14.4 14.3 14.6  PLT 97* 100* 175   Thyroid No results for input(s): "TSH", "FREET4" in the last 168 hours.  BNPNo results for input(s): "BNP", "PROBNP" in the last 168 hours.  DDimer No results for input(s): "DDIMER" in the last 168 hours.   Radiology    VAS [place] LOWER EXTREMITY VENOUS (DVT)  Result Date: 05/23/2023  Lower Venous DVT Study Patient Name:  LAVAN DISANTI  Date of Exam:   05/23/2023 Medical Rec #: [id number]       Accession  #:    [id number] Date of Birth: 03/31/1956      Patient Gender: M Patient Age:   67 years Exam Location:  [facility] Procedure:      VAS [place] LOWER EXTREMITY VENOUS (DVT) Referring Phys: Scheryl Marten XU --------------------------------------------------------------------------------  Indications: Stroke.  Comparison Study: No prior study Performing Technologist: Sherren Kerns RVS  Examination Guidelines: A complete evaluation includes B-mode imaging, spectral Doppler, color Doppler, and power Doppler as needed of all accessible portions of each vessel. Bilateral testing is considered an integral part of a complete examination. Limited examinations for reoccurring indications may be performed as noted. The reflux portion of the exam is performed with the patient in reverse Trendelenburg.  +---------+---------------+---------+-----------+----------+--------------+ RIGHT    CompressibilityPhasicitySpontaneityPropertiesThrombus Aging +---------+---------------+---------+-----------+----------+--------------+ CFV      Full           Yes      Yes                                 +---------+---------------+---------+-----------+----------+--------------+ SFJ      Full                                                        +---------+---------------+---------+-----------+----------+--------------+ FV Prox  Full                                                        +---------+---------------+---------+-----------+----------+--------------+  FV Mid   Full                                                        +---------+---------------+---------+-----------+----------+--------------+ FV DistalFull                                                        +---------+---------------+---------+-----------+----------+--------------+ PFV      Full                                                        +---------+---------------+---------+-----------+----------+--------------+ POP       Full           Yes      Yes                                 +---------+---------------+---------+-----------+----------+--------------+ PTV      Full                                                        +---------+---------------+---------+-----------+----------+--------------+ PERO     Full                                                        +---------+---------------+---------+-----------+----------+--------------+   +---------+---------------+---------+-----------+----------+--------------+ LEFT     CompressibilityPhasicitySpontaneityPropertiesThrombus Aging +---------+---------------+---------+-----------+----------+--------------+ CFV      Full           Yes      Yes                                 +---------+---------------+---------+-----------+----------+--------------+ SFJ      Full                                                        +---------+---------------+---------+-----------+----------+--------------+ FV Prox  Full                                                        +---------+---------------+---------+-----------+----------+--------------+ FV Mid   Full                                                        +---------+---------------+---------+-----------+----------+--------------+  FV DistalFull                                                        +---------+---------------+---------+-----------+----------+--------------+ PFV      Full                                                        +---------+---------------+---------+-----------+----------+--------------+ POP      Full           Yes      Yes                                 +---------+---------------+---------+-----------+----------+--------------+ PTV      Full                                                        +---------+---------------+---------+-----------+----------+--------------+ PERO     Full                                                         +---------+---------------+---------+-----------+----------+--------------+     Summary: BILATERAL: - No evidence of deep vein thrombosis seen in the lower extremities, bilaterally. -No evidence of popliteal cyst, bilaterally.   *See table(s) above for measurements and observations. Electronically signed by Lemar Livings MD on 05/23/2023 at 3:51:24 PM.    Final    CT ANGIO HEAD NECK W WO CM  Result Date: 05/22/2023 CLINICAL DATA:  Neuro deficit, acute, stroke suspected EXAM: CT ANGIOGRAPHY HEAD AND NECK WITH AND WITHOUT CONTRAST TECHNIQUE: Multidetector CT imaging of the head and neck was performed using the standard protocol during bolus administration of intravenous contrast. Multiplanar CT image reconstructions and MIPs were obtained to evaluate the vascular anatomy. Carotid stenosis measurements (when applicable) are obtained utilizing NASCET criteria, using the distal internal carotid diameter as the denominator. RADIATION DOSE REDUCTION: This exam was performed according to the departmental dose-optimization program which includes automated exposure control, adjustment of the mA and/or kV according to patient size and/or use of iterative reconstruction technique. CONTRAST:  75mL OMNIPAQUE IOHEXOL 350 MG/ML SOLN COMPARISON:  MRI head 05/21/2023. FINDINGS: CT HEAD FINDINGS Brain: Known small acute infarcts better characterized on recent MRI head. No evidence of progressive mass effect or acute hemorrhage. No midline shift or hydrocephalus. No mass lesion. Vascular: See below. Skull: No acute fracture. Sinuses/Orbits: Inferior right greater left maxillary sinus mucosal thickening. No acute orbital findings. Other: No mastoid effusions. Review of the MIP images confirms the above findings CTA NECK FINDINGS Aortic arch: Great vessel origins are patent.  Atherosclerosis. Right carotid system: Atherosclerosis at the carotid bifurcation without greater than 50% stenosis. Left carotid system:  Atherosclerosis at the carotid bifurcation without greater than 50% stenosis. Vertebral arteries: The vertebral arteries are patent bilaterally. Left dominant. Moderate  left vertebral artery origin stenosis. Skeleton: No acute abnormality on limited assessment. Solid C4-C5 ACDF. Other neck: No acute abnormality on limited assessment. Upper chest: Small partially imaged layering pleural effusions bilaterally. Lung apices are otherwise clear. Review of the MIP images confirms the above findings CTA HEAD FINDINGS Anterior circulation: Bilateral intracranial ICAs MCAs and ACAs are patent. Moderate bilateral paraclinoid ICA stenosis due to atherosclerosis. No aneurysm identified. Posterior circulation: Bilateral intradural vertebral arteries are patent. The right non dominant/small intradural vertebral artery largely terminates as PICA, anatomic variant. Basilar artery and bilateral posterior cerebral arteries are patent without proximal hemodynamically significant stenosis. Venous sinuses: As permitted by contrast timing, patent. Anatomic variants: Detailed above. Review of the MIP images confirms the above findings IMPRESSION: 1. No large vessel occlusion. 2. Moderate bilateral paraclinoid ICA stenosis. 3. Moder no large ate left vertebral artery origin stenosis. Electronically Signed   By: Feliberto Harts M.D.   On: 05/22/2023 16:11    Cardiac Studies   Echo 11/14/2022  1. Left ventricular ejection fraction, by estimation, is 55 to 60%. The  left ventricle has normal function. The left ventricle has no regional  wall motion abnormalities. There is mild left ventricular hypertrophy.  Left ventricular diastolic parameters  are consistent with Grade I diastolic dysfunction (impaired relaxation).  The average left ventricular global longitudinal strain is -19.0 %. The  global longitudinal strain is normal.   2. Right ventricular systolic function is normal. The right ventricular  size is normal. Tricuspid  regurgitation signal is inadequate for assessing  PA pressure.   3. The mitral valve is normal in structure. Trivial mitral valve  regurgitation. No evidence of mitral stenosis.   4. The tricuspid valve is abnormal.   5. The aortic valve has an indeterminant number of cusps. Aortic valve  regurgitation is moderate. Severe aortic valve stenosis.   6. Limited visualization of the ascending aorta, the visualized parts are  at least moderately dilated. Consider additional imaging with CTA or MRA  to better evalute degree and extent of aortic aneurysm. Marland Kitchen Aortic  dilatation noted. There is mild  dilatation of the aortic root, measuring 42 mm. There is moderate  dilatation of the ascending aorta, measuring 48 mm.   7. The inferior vena cava is normal in size with greater than 50%  respiratory variability, suggesting right atrial pressure of 3 mmHg.    Cath 04/03/2023   Prox RCA lesion is 100% stenosed.   There is severe aortic valve stenosis.   1.  Known severe aortic stenosis by noninvasive assessment, likely bicuspid valve 2.  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  Diffuse nonobstructive plaquing in the left main, LAD, and left circumflex with no significant obstruction. 4.  Normal right heart pressures   Recommendations: Continued evaluation for aortic valve replacement and ascending aortic aneurysm repair.  Recommend CTA studies followed by surgical consultation.   Brain MRI 05/21/2023 FINDINGS: Brain: Are numerous small foci of diffusion restriction in the left cerebellar hemisphere and both cerebral hemispheres in the MCA and PCA distributions consistent with small acute infarcts, likely embolic in etiology. There is no associated hemorrhage or mass effect.   There is no acute intracranial hemorrhage or extra-axial fluid collection   Background parenchymal volume is normal. The ventricles are normal in size. There is a minimal background  chronic small-vessel ischemic change. There are scattered punctate foci of SWI signal dropout which are nonspecific but may be related to prior emboli.  The pituitary and suprasellar region are normal. There is no mass lesion. There is no mass effect or midline shift.   Vascular: Normal flow voids.   Skull and upper cervical spine: Normal marrow signal.   Sinuses/Orbits: There is mild mucosal thickening in the paranasal sinuses. The globes and orbits are unremarkable.   Other: The mastoid air cells and middle ear cavities are clear.   IMPRESSION: Scattered small acute infarcts in the left cerebellar hemisphere and both cerebral hemispheres, likely embolic in etiology.   Patient Profile     67 y.o. male with PMH of CAD with CTO of RCA, HTN, HLD, PSVT, RCC s/p partial R nephrectomy 2013 and severe aortic stenosis who underwent AVR and aortic root replacement. Cardiology consulted for postop afib with RVR. Placed on amiodarone. MRI of the brain showed small scattered acute CVA  Assessment & Plan    Postop atrial fibrillation  - occurred on post op day #3  - placed on IV amiodarone with plan to consider 30 days outpatient monitor and hold off on anticoagulation, however same brain MRI showed scattered small acute CVA. Seen by neurology, coumadin started.  - very unusual to have clot formation and emboli with only 2.5 hours of afib. 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 Seen by neurology service who recommended anticoagulation. Coumadin started  Severe aortic stenosis s/p AVR and aortic root replacement. TTE in 6 weeks, SBE prophylaxis  Postop anemia: hgb 8.1 05/23/23   HTN  HLD  Patient will need [name] on d/c ? Monday    Charlton Haws, MD  05/24/2023, 8:23 AM

## 2023-05-25 ENCOUNTER — Encounter (HOSPITAL_COMMUNITY): Payer: Self-pay | Admitting: Physical Medicine & Rehabilitation

## 2023-05-25 ENCOUNTER — Telehealth: Payer: Self-pay | Admitting: Physician Assistant

## 2023-05-25 ENCOUNTER — Other Ambulatory Visit: Payer: Self-pay

## 2023-05-25 ENCOUNTER — Inpatient Hospital Stay (HOSPITAL_COMMUNITY)
Admission: RE | Admit: 2023-05-25 | Discharge: 2023-06-04 | DRG: 057 | Disposition: A | Discharge status: Home / Self Care | Payer: Medicare Other | Source: Intra-hospital | Attending: Physical Medicine & Rehabilitation | Admitting: Physical Medicine & Rehabilitation

## 2023-05-25 DIAGNOSIS — Z7901 Long term (current) use of anticoagulants: Secondary | ICD-10-CM | POA: Diagnosis not present

## 2023-05-25 DIAGNOSIS — I69393 Ataxia following cerebral infarction: Secondary | ICD-10-CM

## 2023-05-25 DIAGNOSIS — M545 Low back pain, unspecified: Secondary | ICD-10-CM | POA: Diagnosis present

## 2023-05-25 DIAGNOSIS — I639 Cerebral infarction, unspecified: Secondary | ICD-10-CM | POA: Diagnosis not present

## 2023-05-25 DIAGNOSIS — I63032 Cerebral infarction due to thrombosis of left carotid artery: Secondary | ICD-10-CM | POA: Diagnosis not present

## 2023-05-25 DIAGNOSIS — K5900 Constipation, unspecified: Secondary | ICD-10-CM | POA: Diagnosis present

## 2023-05-25 DIAGNOSIS — D6959 Other secondary thrombocytopenia: Secondary | ICD-10-CM | POA: Diagnosis present

## 2023-05-25 DIAGNOSIS — E785 Hyperlipidemia, unspecified: Secondary | ICD-10-CM | POA: Diagnosis present

## 2023-05-25 DIAGNOSIS — I350 Nonrheumatic aortic (valve) stenosis: Principal | ICD-10-CM

## 2023-05-25 DIAGNOSIS — Z79899 Other long term (current) drug therapy: Secondary | ICD-10-CM | POA: Diagnosis not present

## 2023-05-25 DIAGNOSIS — R5381 Other malaise: Secondary | ICD-10-CM | POA: Diagnosis present

## 2023-05-25 DIAGNOSIS — I4891 Unspecified atrial fibrillation: Secondary | ICD-10-CM | POA: Diagnosis present

## 2023-05-25 DIAGNOSIS — D62 Acute posthemorrhagic anemia: Secondary | ICD-10-CM | POA: Diagnosis present

## 2023-05-25 DIAGNOSIS — K219 Gastro-esophageal reflux disease without esophagitis: Secondary | ICD-10-CM | POA: Diagnosis present

## 2023-05-25 DIAGNOSIS — Z952 Presence of prosthetic heart valve: Secondary | ICD-10-CM | POA: Diagnosis not present

## 2023-05-25 DIAGNOSIS — M5416 Radiculopathy, lumbar region: Secondary | ICD-10-CM

## 2023-05-25 DIAGNOSIS — I6340 Cerebral infarction due to embolism of unspecified cerebral artery: Secondary | ICD-10-CM

## 2023-05-25 DIAGNOSIS — I97190 Other postprocedural cardiac functional disturbances following cardiac surgery: Secondary | ICD-10-CM | POA: Diagnosis present

## 2023-05-25 DIAGNOSIS — I69331 Monoplegia of upper limb following cerebral infarction affecting right dominant side: Secondary | ICD-10-CM | POA: Diagnosis present

## 2023-05-25 DIAGNOSIS — N401 Enlarged prostate with lower urinary tract symptoms: Secondary | ICD-10-CM | POA: Diagnosis present

## 2023-05-25 DIAGNOSIS — I2510 Atherosclerotic heart disease of native coronary artery without angina pectoris: Secondary | ICD-10-CM | POA: Diagnosis present

## 2023-05-25 DIAGNOSIS — I10 Essential (primary) hypertension: Secondary | ICD-10-CM | POA: Diagnosis present

## 2023-05-25 DIAGNOSIS — Z87891 Personal history of nicotine dependence: Secondary | ICD-10-CM

## 2023-05-25 DIAGNOSIS — I6389 Other cerebral infarction: Secondary | ICD-10-CM | POA: Diagnosis not present

## 2023-05-25 DIAGNOSIS — R7989 Other specified abnormal findings of blood chemistry: Secondary | ICD-10-CM | POA: Diagnosis present

## 2023-05-25 DIAGNOSIS — Z4803 Encounter for change or removal of drains: Secondary | ICD-10-CM | POA: Diagnosis not present

## 2023-05-25 DIAGNOSIS — I252 Old myocardial infarction: Secondary | ICD-10-CM

## 2023-05-25 DIAGNOSIS — M5431 Sciatica, right side: Secondary | ICD-10-CM | POA: Diagnosis present

## 2023-05-25 DIAGNOSIS — Z7982 Long term (current) use of aspirin: Secondary | ICD-10-CM | POA: Diagnosis not present

## 2023-05-25 DIAGNOSIS — R338 Other retention of urine: Secondary | ICD-10-CM | POA: Diagnosis present

## 2023-05-25 DIAGNOSIS — Z905 Acquired absence of kidney: Secondary | ICD-10-CM

## 2023-05-25 DIAGNOSIS — Z8249 Family history of ischemic heart disease and other diseases of the circulatory system: Secondary | ICD-10-CM

## 2023-05-25 DIAGNOSIS — Z4801 Encounter for change or removal of surgical wound dressing: Secondary | ICD-10-CM

## 2023-05-25 DIAGNOSIS — Z85528 Personal history of other malignant neoplasm of kidney: Secondary | ICD-10-CM

## 2023-05-25 LAB — TSH: TSH: 1.086 u[IU]/mL (ref 0.350–4.500)

## 2023-05-25 LAB — PROTIME-INR
INR: 3.6 — ABNORMAL HIGH (ref 0.8–1.2)
Prothrombin Time: 36.4 seconds — ABNORMAL HIGH (ref 11.4–15.2)

## 2023-05-25 MED ORDER — OXYCODONE HCL 5 MG PO TABS: 5.0000 mg | ORAL_TABLET | ORAL | Status: DC | PRN

## 2023-05-25 MED ORDER — OXYCODONE HCL 5 MG PO TABS: 5.0000 mg | ORAL_TABLET | Freq: Four times a day (QID) | ORAL | 0 refills | Status: DC | PRN

## 2023-05-25 MED ORDER — AMIODARONE HCL 200 MG PO TABS
400.0000 mg | ORAL_TABLET | Freq: Two times a day (BID) | ORAL | Status: DC
  Administered 2023-05-25 – 2023-05-26 (×2): 400 mg via ORAL
  Filled 2023-05-25 (×2): qty 2

## 2023-05-25 MED ORDER — GUAIFENESIN-DM 100-10 MG/5ML PO SYRP: 10.0000 mL | ORAL_SOLUTION | Freq: Four times a day (QID) | ORAL | Status: DC | PRN

## 2023-05-25 MED ORDER — FINASTERIDE 5 MG PO TABS
5.0000 mg | ORAL_TABLET | Freq: Every day | ORAL | Status: DC
  Administered 2023-05-26 – 2023-06-04 (×10): 5 mg via ORAL
  Filled 2023-05-25 (×10): qty 1

## 2023-05-25 MED ORDER — FLEET ENEMA 7-19 GM/118ML RE ENEM
1.0000 | ENEMA | Freq: Once | RECTAL | Status: AC | PRN
  Administered 2023-05-26: 1 via RECTAL
  Filled 2023-05-25: qty 1

## 2023-05-25 MED ORDER — TRAMADOL HCL 50 MG PO TABS: 50.0000 mg | ORAL_TABLET | ORAL | Status: DC | PRN

## 2023-05-25 MED ORDER — METOPROLOL TARTRATE 25 MG/10 ML ORAL SUSPENSION
12.5000 mg | Freq: Two times a day (BID) | ORAL | Status: DC
  Filled 2023-05-25 (×2): qty 5

## 2023-05-25 MED ORDER — METOPROLOL TARTRATE 12.5 MG HALF TABLET
12.5000 mg | ORAL_TABLET | Freq: Two times a day (BID) | ORAL | Status: DC
  Administered 2023-05-25 – 2023-06-04 (×19): 12.5 mg via ORAL
  Filled 2023-05-25 (×20): qty 1

## 2023-05-25 MED ORDER — PANTOPRAZOLE SODIUM 40 MG PO TBEC
40.0000 mg | DELAYED_RELEASE_TABLET | Freq: Every day | ORAL | Status: DC
  Administered 2023-05-26 – 2023-06-04 (×10): 40 mg via ORAL
  Filled 2023-05-25 (×10): qty 1

## 2023-05-25 MED ORDER — ATORVASTATIN CALCIUM 80 MG PO TABS
80.0000 mg | ORAL_TABLET | Freq: Every day | ORAL | Status: DC
  Administered 2023-05-26 – 2023-06-04 (×10): 80 mg via ORAL
  Filled 2023-05-25 (×10): qty 1

## 2023-05-25 MED ORDER — FE FUM-VIT C-VIT B12-FA 460-60-0.01-1 MG PO CAPS
1.0000 | ORAL_CAPSULE | Freq: Every day | ORAL | Status: DC
  Administered 2023-05-26 – 2023-06-04 (×10): 1 via ORAL
  Filled 2023-05-25 (×10): qty 1

## 2023-05-25 MED ORDER — AMIODARONE HCL 200 MG PO TABS: 200.0000 mg | ORAL_TABLET | Freq: Every day | ORAL | Status: DC

## 2023-05-25 MED ORDER — METHOCARBAMOL 500 MG PO TABS: 500.0000 mg | ORAL_TABLET | Freq: Four times a day (QID) | ORAL | Status: DC | PRN

## 2023-05-25 MED ORDER — ALUM & MAG HYDROXIDE-SIMETH 200-200-20 MG/5ML PO SUSP: 30.0000 mL | ORAL | Status: DC | PRN

## 2023-05-25 MED ORDER — ASPIRIN 81 MG PO CHEW
81.0000 mg | CHEWABLE_TABLET | Freq: Every day | ORAL | Status: DC
  Filled 2023-05-25: qty 1

## 2023-05-25 MED ORDER — AMIODARONE HCL 200 MG PO TABS: 400.0000 mg | ORAL_TABLET | Freq: Two times a day (BID) | ORAL | Status: DC

## 2023-05-25 MED ORDER — AMIODARONE HCL 400 MG PO TABS: ORAL_TABLET | ORAL | Status: DC

## 2023-05-25 MED ORDER — BISACODYL 5 MG PO TBEC
5.0000 mg | DELAYED_RELEASE_TABLET | Freq: Every day | ORAL | Status: DC | PRN
  Administered 2023-05-26: 5 mg via ORAL
  Filled 2023-05-25: qty 1

## 2023-05-25 MED ORDER — ASPIRIN 81 MG PO TBEC
81.0000 mg | DELAYED_RELEASE_TABLET | Freq: Every day | ORAL | Status: DC
  Administered 2023-05-26 – 2023-06-04 (×10): 81 mg via ORAL
  Filled 2023-05-25 (×10): qty 1

## 2023-05-25 MED ORDER — DIPHENHYDRAMINE HCL 25 MG PO CAPS: 25.0000 mg | ORAL_CAPSULE | Freq: Four times a day (QID) | ORAL | Status: DC | PRN

## 2023-05-25 MED ORDER — ONDANSETRON HCL 4 MG PO TABS: 4.0000 mg | ORAL_TABLET | Freq: Four times a day (QID) | ORAL | Status: DC | PRN

## 2023-05-25 MED ORDER — FE FUM-VIT C-VIT B12-FA 460-60-0.01-1 MG PO CAPS: 1.0000 | ORAL_CAPSULE | Freq: Every day | ORAL | 0 refills | Status: DC

## 2023-05-25 MED ORDER — JUVEN PO PACK: 1.0000 | PACK | Freq: Two times a day (BID) | ORAL | 0 refills | Status: DC

## 2023-05-25 MED ORDER — ACETAMINOPHEN 325 MG PO TABS: 325.0000 mg | ORAL_TABLET | ORAL | Status: DC | PRN

## 2023-05-25 MED ORDER — ONDANSETRON HCL 4 MG/2ML IJ SOLN: 4.0000 mg | Freq: Four times a day (QID) | INTRAMUSCULAR | Status: DC | PRN

## 2023-05-25 MED ORDER — BACLOFEN 10 MG PO TABS
10.0000 mg | ORAL_TABLET | Freq: Every day | ORAL | Status: DC
  Administered 2023-05-26 – 2023-06-04 (×10): 10 mg via ORAL
  Filled 2023-05-25 (×10): qty 1

## 2023-05-25 MED ORDER — POLYETHYLENE GLYCOL 3350 17 G PO PACK
17.0000 g | PACK | Freq: Every day | ORAL | Status: DC | PRN
  Administered 2023-05-25 – 2023-05-26 (×2): 17 g via ORAL
  Filled 2023-05-25 (×2): qty 1

## 2023-05-25 MED ORDER — WARFARIN - PHYSICIAN DOSING INPATIENT: Freq: Every day | Status: DC

## 2023-05-25 MED ORDER — TRAZODONE HCL 50 MG PO TABS: 50.0000 mg | ORAL_TABLET | Freq: Every evening | ORAL | Status: DC | PRN

## 2023-05-25 MED ORDER — JUVEN PO PACK
1.0000 | PACK | Freq: Two times a day (BID) | ORAL | Status: DC
  Administered 2023-05-25 – 2023-06-04 (×19): 1 via ORAL
  Filled 2023-05-25 (×19): qty 1

## 2023-05-25 NOTE — Care Management Important Message (Signed)
Important Message  Patient Details  Name: [name] MRN: [id number] Date of Birth: 1956-06-15   Medicare Important Message Given:  Yes     Renie Ora 05/25/2023, 11:38 AM

## 2023-05-25 NOTE — Progress Notes (Addendum)
      [address]       [place] [id number]             [phone]        7 Days Post-Op Procedure(s) (LRB): AORTIC VALVE REPLACEMENT (AVR) USING INSPIRIS RESILIA 23 MM AORTIC VALVE (N/A) REPLACEMENT OF ASCENDING AORTIC ANEURYSM USING A 30 X 10 MM HEMASHIELD PLATINUM GRAFT (N/A) TRANSESOPHAGEAL ECHOCARDIOGRAM (N/A)  Subjective: Patient sleeping and briefly awakened this am.  Objective: Vital signs in last 24 hours: Temp:  [97.7 F (36.5 C)-98.2 F (36.8 C)] 98.2 F (36.8 C) (07/08 0409) Pulse Rate:  [69-84] 69 (07/08 0409) Cardiac Rhythm: Normal sinus rhythm (07/07 1944) Resp:  [15-19] 18 (07/08 0409) BP: (111-135)/(73-87) 117/80 (07/08 0409) SpO2:  [93 %-99 %] 99 % (07/08 0409) Weight:  [75.1 kg] 75.1 kg (07/08 0500)  Pre op weight 77.1  Current Weight  05/25/23 75.1 kg      Intake/Output from previous day: 07/07 0701 - 07/08 0700 In: 240 [P.O.:240] Out: -    Physical Exam:  Cardiovascular: RRR, no murmur Pulmonary: Clear to auscultation bilaterally Abdomen: Soft, non tender, bowel sounds present. Extremities: No lower extremity edema. Wound: Clean and dry.  No erythema or signs of infection.  Lab Results: CBC: Recent Labs    05/23/23 0815  WBC 7.0  HGB 8.1*  HCT 25.1*  PLT 175   BMET:  Recent Labs    05/23/23 0815  NA 140  K 3.5  CL 99  CO2 25  GLUCOSE 143*  BUN 26*  CREATININE 0.84  CALCIUM 8.7*    PT/INR:  Lab Results  Component Value Date   INR 3.6 (H) 05/25/2023   INR 2.6 (H) 05/24/2023   INR 1.3 (H) 05/23/2023   ABG:  INR: Will add last result for INR, ABG once components are confirmed Will add last 4 CBG results once components are confirmed  Assessment/Plan:  1. CV - Previous a fib. SR this am. On Amiodarone 400 mg bid,Lopressor 12.5 mg bid, and Coumadin. INR increased from 2.6 to 3.6. He has been given 2 doses of 5 mg and 2.5 mg last evening. Will hold Coumadin tonight.  2.  Pulmonary - On room air.  Encourage incentive spirometer. 3.  Expected post op acute blood loss anemia - Last H and H stable at 8.1 and 25.1. Continue Trigels Forte. 4. Neurologic-acute CVA (scattered small acute infarcts in the left cerebellar hemisphere and both cerebral hemispheres). On Coumadin. 5. Will need [name] at discharge  Donielle M ZimmermanPA-C 7:19 AM   Chart reviewed, patient examined, agree with above. He feels fine. Approved to go to [name] today. INR increased to 3.6 after 5 mg x 2 and 2.5 mg last night. Will hold Coumadin tonight and resume 2.5 mg tomorrow. Goal INR is 2-3. We will follow in [name].

## 2023-05-25 NOTE — Progress Notes (Signed)
CARDIAC REHAB PHASE I   PRE:  Rate/Rhythm: 70 SR    BP: sitting 112/78    SpO2: 100 RA  MODE:  Ambulation: 230 ft   POST:  Rate/Rhythm: 103 ST with PACs    BP: sitting 166/96     SpO2: 100 RA  Pt rolled to left side of bed to work on independence. Able to get to edge of bed without assist.  Stood with contact guard, used RW to ambulate. Increased endurance today, pt aware of his foot deficits with good control. To recliner after walk. BP elevated.   Discussed with pt IS, sternal precautions, exercise, diet, and CRPII. Pt receptive. Will refer to AP CRPII. Gave [facility] book and d/c video to view.  [id number]  Ethelda Chick BS, ACSM-CEP 05/25/2023 9:17 AM

## 2023-05-25 NOTE — Progress Notes (Signed)
Inpatient Rehabilitation Admission Medication Review by a Pharmacist  A complete drug regimen review was completed for this patient to identify any potential clinically significant medication issues.  High Risk Drug Classes Is patient taking? Indication by Medication  Antipsychotic No   Anticoagulant Yes Warfarin - CVA, afib  Antibiotic No   Opioid Yes Oxycodone - severe pain Tramadol - moderate pain  Antiplatelet Yes Aspirin - CVA  Hypoglycemics/insulin No   Vasoactive Medication Yes Amiodarone - afib Metoprolol - HTN  Chemotherapy No   Other Yes Atorvastatin - HLD Baclofen - pain/muscle spasms Finasteride - BPH Trigels-F Forte - vitamin replacement Juven - nutrition Protonix - GERD Tylenol - pain Maalox - constipation Bisacodyl - constipation Benadryl - itching Robitussin DM - cough Methocarbamol - muscle spasms Zofran - nausea/vomiting Miralax - constipation Fleets enema - severe constipation Trazodone - sleep     Type of Medication Issue Identified Description of Issue Recommendation(s)  Drug Interaction(s) (clinically significant)     Duplicate Therapy     Allergy     No Medication Administration End Date     Incorrect Dose     Additional Drug Therapy Needed     Significant med changes from prior encounter (inform family/care partners about these prior to discharge). Pt was on amlodipine 5mg  daily and losartan 100mg  daily. F/u if these are resumed prior to or at d/c  Other       Clinically significant medication issues were identified that warrant physician communication and completion of prescribed/recommended actions by midnight of the next day:  No   Pharmacist comments:   Time spent performing this drug regimen review (minutes):  30   Christoper Fabian, PharmD, BCPS Please see amion for complete clinical pharmacist phone list 05/25/2023 5:38 PM

## 2023-05-25 NOTE — Progress Notes (Signed)
Occupational Therapy Treatment Patient Details Name: [name] MRN: [id number] DOB: [birth year] Today's Date: 05/25/2023   History of present illness Patient is a 67 y/o male admitted 05/18/23 with severe aortic stenosis Thoracic aortic aneurysm and underwent AVR and replacement of ascending aortic aneurysm and TTE. Pt with post op afib and on 7/4 pr with rt sided weakness and MRI showed scattered small embolic infarcts in bilateral hemispheres and left cerebellum. PMH positive for NSTEMI 2015 c/p cath, renal cell carcinoma s/p partial R nephrectomy 2013, severe degenerative spine disease s/p low back surgery in 80's and cervical disc surgery 2022.   OT comments  Pt progressing towards goals, needs min cues to recall sternal precautions during session. Pt provided with fine motor coordination and UE exercise handout (within limits of sternal prec), and pt able to demo. Pt also provided with squeeze ball/green foam cube, putty, and red built up utensils. Pt needing min guard- min A for simulated toilet transfer and standing grooming task, min guard-min A for bed mobility, and min A for transfers with RW. Pt presenting with impairments listed below, will follow acutely. Patient will benefit from intensive inpatient follow up therapy, >3 hours/day to maximize safety/ind with ADLs/functional mobility.    Recommendations for follow up therapy are one component of a multi-disciplinary discharge planning process, led by the attending physician.  Recommendations may be updated based on patient status, additional functional criteria and insurance authorization.    Assistance Recommended at Discharge Intermittent Supervision/Assistance  Patient can return home with the following  A little help with walking and/or transfers;A little help with bathing/dressing/bathroom   Equipment Recommendations  BSC/3in1;Other (comment) (RW)    Recommendations for Other Services Rehab consult    Precautions /  Restrictions Precautions Precautions: Fall;Sternal Precaution Booklet Issued: Yes (comment) Precaution Comments: sternal precautions Restrictions RUE Weight Bearing: Non weight bearing LUE Weight Bearing: Non weight bearing Other Position/Activity Restrictions: sternal precautions       Mobility Bed Mobility Overal bed mobility: Needs Assistance Bed Mobility: Rolling, Sidelying to Sit   Sidelying to sit: Min assist     Sit to sidelying: Min guard General bed mobility comments: min cues for sternal precautions with bed mobility    Transfers Overall transfer level: Needs assistance Equipment used: Rolling walker (2 wheels) Transfers: Sit to/from Stand Sit to Stand: Min assist                 Balance Overall balance assessment: Needs assistance Sitting-balance support: Feet supported, No upper extremity supported Sitting balance-Leahy Scale: Good     Standing balance support: Bilateral upper extremity supported, During functional activity, Reliant on assistive device for balance Standing balance-Leahy Scale: Poor                             ADL either performed or assessed with clinical judgement   ADL Overall ADL's : Needs assistance/impaired Eating/Feeding: Set up;With adaptive utensils;Bed level Eating/Feeding Details (indicate cue type and reason): simulated with use of built up utensils Grooming: Minimal assistance;Standing Grooming Details (indicate cue type and reason): for bilateral aspects of task                 Toilet Transfer: Min guard;Ambulation;Regular Toilet;Rolling walker (2 wheels)           Functional mobility during ADLs: Min guard;Rolling walker (2 wheels)      Extremity/Trunk Assessment Upper Extremity Assessment Upper Extremity Assessment: RUE deficits/detail RUE Deficits / Details:  distal vs proximal weakness, 2/5 grasp, limited wrist extension and limited pronation/supination; reports decr sensation in digits  1&2 RUE Sensation: decreased light touch;decreased proprioception RUE Coordination: decreased fine motor;decreased gross motor LUE Sensation: history of peripheral neuropathy;decreased light touch   Lower Extremity Assessment Lower Extremity Assessment: Defer to PT evaluation        Vision   Vision Assessment?: No apparent visual deficits   Perception Perception Perception: Not tested   Praxis Praxis Praxis: Not tested    Cognition Arousal/Alertness: Awake/alert Behavior During Therapy: WFL for tasks assessed/performed Overall Cognitive Status: Impaired/Different from baseline                                 General Comments: unable to fully recall sternal prec, needs increased cues        Exercises Exercises: Other exercises Other Exercises Other Exercises: RUE pronated isolated digit extension Other Exercises: RUE wrist flex/ext Other Exercises: RUE squeeze ball/green foam squeeze Other Exercises: RUE digit add/abd Other Exercises: RUE elbow flex/ext    Shoulder Instructions       General Comments VSS on RA, family present    Pertinent Vitals/ Pain       Pain Assessment Pain Assessment: No/denies pain  Home Living                                          Prior Functioning/Environment              Frequency  Min 1X/week        Progress Toward Goals  OT Goals(current goals can now be found in the care plan section)  Progress towards OT goals: Progressing toward goals  Acute Rehab OT Goals Patient Stated Goal: none stated OT Goal Formulation: With patient Time For Goal Achievement: 06/05/23 Potential to Achieve Goals: Good ADL Goals Pt Will Perform Upper Body Dressing: with min guard assist;sitting Pt Will Perform Lower Body Dressing: with min guard assist;sitting/lateral leans;sit to/from stand Pt Will Transfer to Toilet: with min guard assist;ambulating;regular height toilet Additional ADL Goal #1: pt will  perform 2 ADLs adhering to sternal precautions with min cues Additional ADL Goal #2: pt will complete R UE HEP min (A) written  Plan Discharge plan remains appropriate;Frequency remains appropriate    Co-evaluation                 AM-PAC OT "6 Clicks" Daily Activity     Outcome Measure   Help from another person eating meals?: A Little Help from another person taking care of personal grooming?: A Little Help from another person toileting, which includes using toliet, bedpan, or urinal?: A Lot Help from another person bathing (including washing, rinsing, drying)?: A Lot Help from another person to put on and taking off regular upper body clothing?: A Lot Help from another person to put on and taking off regular lower body clothing?: A Lot 6 Click Score: 14    End of Session Equipment Utilized During Treatment: Gait belt;Rolling walker (2 wheels)  OT Visit Diagnosis: Unsteadiness on feet (R26.81);Other abnormalities of gait and mobility (R26.89);Muscle weakness (generalized) (M62.81)   Activity Tolerance Patient tolerated treatment well   Patient Left in bed;with call bell/phone within reach;with bed alarm set;with family/visitor present   Nurse Communication Mobility status;Precautions        Time: 1610-9604 OT  Time Calculation (min): 31 min  Charges: OT General Charges $OT Visit: 1 Visit OT Treatments $Self Care/Home Management : 8-22 mins $Therapeutic Exercise: 8-22 mins  Carver Fila, OTD, OTR/L SecureChat Preferred Acute Rehab [phone]   Carver Fila Koonce 05/25/2023, 3:22 PM

## 2023-05-25 NOTE — Progress Notes (Signed)
Mobility Specialist Progress Note:   05/25/23 1257  Mobility  Activity Ambulated with assistance in hallway  Level of Assistance Contact guard assist, steadying assist  Assistive Device Front wheel walker  Distance Ambulated (ft) 400 ft  RUE Weight Bearing NWB  LUE Weight Bearing NWB  Activity Response Tolerated well  Mobility Referral Yes  $Mobility charge 1 Mobility  Mobility Specialist Start Time (ACUTE ONLY) 1210  Mobility Specialist Stop Time (ACUTE ONLY) 1225  Mobility Specialist Time Calculation (min) (ACUTE ONLY) 15 min   Pt received in bed, agreeable to mobility. Sternal precautions reviewed upon standing. C/o R leg pain during ambulation, otherwise asymptomatic throughout. Pt returned to bed with call bell near, and all needs met.   Leory Plowman  Mobility Specialist Please contact via [employer] office at [phone]

## 2023-05-25 NOTE — Progress Notes (Addendum)
Progress Note  Patient Name: [name] Date of Encounter: 05/25/2023  Primary Cardiologist: Nona Dell, MD  Subjective   Feeling well, no acute complaints. No CP or SOB. Requested some assistance to help cx pulm appt 7/19, I have done so and routed note to Dr. Vassie Loll.  Inpatient Medications    Scheduled Meds:  amiodarone  400 mg Oral BID   aspirin EC  81 mg Oral Daily   Or   aspirin  81 mg Per Tube Daily   atorvastatin  80 mg Oral Daily   baclofen  10 mg Oral Daily   Fe Fum-Vit C-Vit B12-FA  1 capsule Oral QPC breakfast   finasteride  5 mg Oral Daily   metoprolol tartrate  12.5 mg Oral BID   Or   metoprolol tartrate  12.5 mg Per Tube BID   nutrition supplement (JUVEN)  1 packet Oral BID BM   pantoprazole  40 mg Oral Daily   sodium chloride flush  3 mL Intravenous Q12H   Warfarin - Physician Dosing Inpatient   Does not apply q1600   Continuous Infusions:  sodium chloride     PRN Meds: sodium chloride, bisacodyl, metoprolol tartrate, ondansetron (ZOFRAN) IV, oxyCODONE, sodium chloride flush, traMADol   Vital Signs    Vitals:   05/24/23 2335 05/25/23 0409 05/25/23 0500 05/25/23 0822  BP: 111/73 117/80  112/78  Pulse: 70 69  70  Resp: 15 18  17   Temp: 97.7 F (36.5 C) 98.2 F (36.8 C)  98.2 F (36.8 C)  TempSrc: Oral Oral  Oral  SpO2: 93% 99%  100%  Weight:   75.1 kg   Height:        Intake/Output Summary (Last 24 hours) at 05/25/2023 0948 Last data filed at 05/24/2023 2335 Gross per 24 hour  Intake 240 ml  Output --  Net 240 ml      05/25/2023    5:00 AM 05/24/2023    5:46 AM 05/23/2023    4:49 AM  Last 3 Weights  Weight (lbs) 165 lb 9.1 oz 164 lb 14.5 oz 157 lb 6.5 oz  Weight (kg) 75.1 kg 74.8 kg 71.4 kg     Telemetry    NSR, occ PVC - Personally Reviewed  Physical Exam   GEN: No acute distress.  HEENT: Normocephalic, atraumatic, sclera non-icteric. Neck: No JVD or bruits. Cardiac: RRR no murmurs, rubs, or gallops. Sternal wound  c/d/i Respiratory: Clear to auscultation bilaterally. Breathing is unlabored. GI: Soft, nontender, non-distended, BS +x 4. MS: no deformity. Extremities: No clubbing or cyanosis. No edema. Distal pedal pulses are 2+ and equal bilaterally. Neuro:  AAOx3. Follows commands. Psych:  Responds to questions appropriately with a normal affect.  Labs    High Sensitivity Troponin:  No results for input(s): "TROPONINIHS" in the last 720 hours.    Cardiac EnzymesNo results for input(s): "TROPONINI" in the last 168 hours. No results for input(s): "TROPIPOC" in the last 168 hours.   Chemistry Recent Labs  Lab 05/20/23 0514 05/21/23 0551 05/23/23 0815  NA 135 135 140  K 3.9 3.8 3.5  CL 103 104 99  CO2 24 24 25   GLUCOSE 132* 141* 143*  BUN 22 26* 26*  CREATININE 1.09 0.98 0.84  CALCIUM 8.2* 8.1* 8.7*  PROT  --   --  5.7*  ALBUMIN  --   --  3.0*  AST  --   --  31  ALT  --   --  33  ALKPHOS  --   --  53  BILITOT  --   --  1.1  GFRNONAA >60 >60 >60  ANIONGAP 8 7 16*     Hematology Recent Labs  Lab 05/20/23 0514 05/21/23 0551 05/23/23 0815  WBC 11.3* 10.8* 7.0  RBC 2.65* 2.65* 2.68*  HGB 8.2* 8.2* 8.1*  HCT 24.6* 24.4* 25.1*  MCV 92.8 92.1 93.7  MCH 30.9 30.9 30.2  MCHC 33.3 33.6 32.3  RDW 14.4 14.3 14.6  PLT 97* 100* 175    BNPNo results for input(s): "BNP", "PROBNP" in the last 168 hours.   DDimer No results for input(s): "DDIMER" in the last 168 hours.   Radiology    VAS [place] LOWER EXTREMITY VENOUS (DVT)  Result Date: 05/23/2023  Lower Venous DVT Study Patient Name:  HULISES FIGGINS  Date of Exam:   05/23/2023 Medical Rec #: [id number]       Accession #:    [id number] Date of Birth: 1956/01/02      Patient Gender: M Patient Age:   67 years Exam Location:  [facility] Procedure:      VAS [place] LOWER EXTREMITY VENOUS (DVT) Referring Phys: Scheryl Marten XU --------------------------------------------------------------------------------  Indications: Stroke.  Comparison Study: No  prior study Performing Technologist: Sherren Kerns RVS  Examination Guidelines: A complete evaluation includes B-mode imaging, spectral Doppler, color Doppler, and power Doppler as needed of all accessible portions of each vessel. Bilateral testing is considered an integral part of a complete examination. Limited examinations for reoccurring indications may be performed as noted. The reflux portion of the exam is performed with the patient in reverse Trendelenburg.  +---------+---------------+---------+-----------+----------+--------------+ RIGHT    CompressibilityPhasicitySpontaneityPropertiesThrombus Aging +---------+---------------+---------+-----------+----------+--------------+ CFV      Full           Yes      Yes                                 +---------+---------------+---------+-----------+----------+--------------+ SFJ      Full                                                        +---------+---------------+---------+-----------+----------+--------------+ FV Prox  Full                                                        +---------+---------------+---------+-----------+----------+--------------+ FV Mid   Full                                                        +---------+---------------+---------+-----------+----------+--------------+ FV DistalFull                                                        +---------+---------------+---------+-----------+----------+--------------+ PFV      Full                                                        +---------+---------------+---------+-----------+----------+--------------+  POP      Full           Yes      Yes                                 +---------+---------------+---------+-----------+----------+--------------+ PTV      Full                                                        +---------+---------------+---------+-----------+----------+--------------+ PERO     Full                                                         +---------+---------------+---------+-----------+----------+--------------+   +---------+---------------+---------+-----------+----------+--------------+ LEFT     CompressibilityPhasicitySpontaneityPropertiesThrombus Aging +---------+---------------+---------+-----------+----------+--------------+ CFV      Full           Yes      Yes                                 +---------+---------------+---------+-----------+----------+--------------+ SFJ      Full                                                        +---------+---------------+---------+-----------+----------+--------------+ FV Prox  Full                                                        +---------+---------------+---------+-----------+----------+--------------+ FV Mid   Full                                                        +---------+---------------+---------+-----------+----------+--------------+ FV DistalFull                                                        +---------+---------------+---------+-----------+----------+--------------+ PFV      Full                                                        +---------+---------------+---------+-----------+----------+--------------+ POP      Full           Yes      Yes                                 +---------+---------------+---------+-----------+----------+--------------+  PTV      Full                                                        +---------+---------------+---------+-----------+----------+--------------+ PERO     Full                                                        +---------+---------------+---------+-----------+----------+--------------+     Summary: BILATERAL: - No evidence of deep vein thrombosis seen in the lower extremities, bilaterally. -No evidence of popliteal cyst, bilaterally.   *See table(s) above for measurements and observations. Electronically signed by  Lemar Livings MD on 05/23/2023 at 3:51:24 PM.    Final     Cardiac Studies   See EMR for multiple studies  Patient Profile     67 y.o. male with CAD with known CTO of RCA by cath 2015 with unsuccessful angioplasty treated medically (repeat cath 03/2023 with otherwise nonobstructive plaque in LM, LAD, LCx without significant obstruction), HTN, HLD, PSVT, RCC s/p partial R nephrectomy 2013, minimal carotid disease (1-39% RICA 04/2023), bicuspid AV with severe aortic stenosis and 5.1cm ascending aortic aneurysm admitted for elective AVR with Inspiris Resilia pericardial valve and aortic root replacement. Post-op course complicated by right hand weakness prior to developing AF RVR, with brain MRI showing scattered small acute infarcts in the left cerebellar hemisphere and both cerebral hemispheres, likely embolic in etiology. CTA head neck with moderate bilateral paraclinoid ICA stenosis but no large vessel occlusion. Cardiology following for post-op AFib. Patient is felt to require [name].  Assessment & Plan    1. Severe AS with ascending aortic aneurysm s/p AVR with Inspiris Resilia pericardial valve and aortic root replacement - has 6 week echo f/u scheduled in August and post AVR f/u scheduled 7/23 in [place] office (I cancelled the duplicate previously existing 8/8 appt in [place]) - will need to let our team know if he is not out of [name] by then in which case we can reschedule. Previous team has already added appt info to AVS.  - on ASA, Coumadin presently  2. Post-op atrial fibrillation, acute CVA - scattered small acute infarcts in the left cerebellar hemisphere and both cerebral hemispheres, likely embolic. Seen by neurology service who recommended anticoagulation, Coumadin started - please let our team know when patient is discharged from [name] so we can arrange Coumadin follow-up - started on amiodarone this admission - maintaining NSR - will review dosing recs with MD. Addendum: per d/w Dr. Jacques Navy will  continue 400mg  BID through 7/9 then decrease to 200mg  daily on 7/10. D/w CVTS APP - currently on metoprolol 12.5mg  BID - get baseline TSH with labs - recommend to f/u Hgb in [name] to ensure remains stable/improves  3. CAD - continue ASA, atorvastatin  Remainder of issues per primary team   For questions or updates, please contact [facility] Please consult [email or web address] for contact info under Cardiology/STEMI.  Signed, Laurann Montana, PA-C 05/25/2023, 9:48 AM    Patient seen and examined with Ronie Spies PA-C.  Agree as above, with the following exceptions and changes as noted below.  Patient feels well overall, expects slow but  steady recovery from sternotomy. Gen: NAD, CV: RRR, no murmurs, Lungs: clear, Abd: soft, Extrem: Warm, well perfused, no edema, Neuro/Psych: alert and oriented x 3, normal mood and affect. All available labs, radiology testing, previous records reviewed.  Reviewed anticoagulation and amiodarone recommendations, titration as noted above.  Reviewed home medications as compared to current medications.  Anticipate transition to [name] today per rehab staff.  Parke Poisson, MD 05/25/23 11:05 AM

## 2023-05-25 NOTE — H&P (Signed)
Physical Medicine and Rehabilitation Admission H&P    CC: Functional deficits secondary to acute CVA, severe AS, aortic aneurysm  HPI: [name] is a 67 year old male who presented to the [facility] for AVR and ascending aortic aneurysm repair on 05/18/2023. He had evidence of bicuspid aortic valve stenosis with 5.1 cm ascending aortic aneurysm and underwent repair by Dr. Laneta Simmers on 05/18/2023. He was extubated later that day.  He had brief episode of hematuria after difficult Foley placement.  Remains on Proscar for urinary retention.  Intraoperative TEE with EF approximately 50%.  Pacer wires and chest tubes removed on 7/03.  Chest x-ray was stable and he was transferred out to stepdown unit.  Acute blood loss anemia treated with oral iron replacement.  Aspirin dosing decreased to 81 mg due to post-operative thrombocytopenia.  Later in the evening on 7/03 he was noted to have right hand weakness and rapid response was called.  He was noted to have atrial fibrillation with RVR and was hypotensive.  He was started on an amiodarone infusion and cardiology consulted.  He was transition to amiodarone 200 mg twice daily on 7/04.  CT of head without evidence of acute infarct or hemorrhage, however brain MRI showed scattered small acute infarcts in the left cerebellar hemisphere in both cerebral hemispheres likely embolic in etiology.  Neurology was consulted.  He was felt to be a candidate for Coumadin due to the recent new aortic valve as well as postoperative A-fib.  No large vessel occlusion noted.  INR goal is 2.0.  BLE venous duplex negative for DVT on 7/06. Hemoglobin is stable and platelet count is now normal. His incision is healing without signs of infection and he is tolerating his diet. The patient requires inpatient medicine and rehabilitation evaluations and services for ongoing dysfunction secondary to acute CVA, Severe AS, aorotic aneurysm s/p repair.  Past medical history includes for lipidemia,  coronary artery disease status post NSTEMI in 2015 with catheterization showing an occluded proximal RCA with right to right collaterals filling the distal vessel that cannot be opened, renal cell carcinoma status post partial right nephrectomy in 2013, severe degenerative spine disease.  Previous lower back surgery in the 1980s and a cervical disc surgery in 2022.   Review of Systems  Constitutional:  Positive for malaise/fatigue. Negative for fever.  HENT: Negative.    Eyes: Negative.   Respiratory:  Positive for cough and shortness of breath.   Cardiovascular:  Positive for chest pain.  Gastrointestinal: Negative.   Genitourinary: Negative.   Musculoskeletal:  Positive for back pain, joint pain and myalgias.  Skin: Negative.   Neurological:  Positive for sensory change, focal weakness and weakness.  Psychiatric/Behavioral:  Negative for depression.    Past Medical History:  Diagnosis Date   Arthritis    Coronary atherosclerosis of native coronary artery    a. NSTEMI (02/2014):  LHC (02/2014):  Mild disease in LAD and CFX; prox RCA occluded with R-R collats, dist RCA filled by L-R collats, inf HK, EF 55%, LVEDP 15 mmHg.  PCI:  Unsuccessful angioplasty of RCA (late presentation of inf MI) - tx medically.   Essential hypertension    GERD (gastroesophageal reflux disease)    Hyperlipidemia    NSTEMI (non-ST elevated myocardial infarction) (HCC) 03/11/14   Renal cell carcinoma of right kidney (HCC)    Partial nephrectomy in 2013   Past Surgical History:  Procedure Laterality Date   AORTIC VALVE REPLACEMENT N/A 05/18/2023   Procedure: AORTIC VALVE REPLACEMENT (  AVR) USING INSPIRIS RESILIA 23 MM AORTIC VALVE;  Surgeon: Alleen Borne, MD;  Location: [facility] OR;  Service: Open Heart Surgery;  Laterality: N/A;   BACK SURGERY     1989   CERVICAL DISC SURGERY  04/29/2021   COLONOSCOPY N/A 12/11/2020   Procedure: COLONOSCOPY;  Surgeon: Franky Macho, MD;  Location: [facility] ENDO SUITE;  Service:  Gastroenterology;  Laterality: N/A;   LEFT HEART CATHETERIZATION WITH CORONARY ANGIOGRAM N/A 03/17/2014   Procedure: LEFT HEART CATHETERIZATION WITH CORONARY ANGIOGRAM;  Surgeon: Corky Crafts, MD;  Location: [facility] CATH LAB;  Service: Cardiovascular;  Laterality: N/A;   POLYPECTOMY  12/11/2020   Procedure: POLYPECTOMY;  Surgeon: Franky Macho, MD;  Location: [facility] ENDO SUITE;  Service: Gastroenterology;;   REPLACEMENT ASCENDING AORTA N/A 05/18/2023   Procedure: REPLACEMENT OF ASCENDING AORTIC ANEURYSM USING A 30 X 10 MM HEMASHIELD PLATINUM GRAFT;  Surgeon: Alleen Borne, MD;  Location: [facility] OR;  Service: Open Heart Surgery;  Laterality: N/A;  CIRC ARREST   RIGHT HEART CATH AND CORONARY ANGIOGRAPHY N/A 04/03/2023   Procedure: RIGHT HEART CATH AND CORONARY ANGIOGRAPHY;  Surgeon: Tonny Bollman, MD;  Location: [facility] INVASIVE CV LAB;  Service: Cardiovascular;  Laterality: N/A;   ROBOT ASSISTED LAPAROSCOPIC NEPHRECTOMY  01/14/2012   Procedure: ROBOTIC ASSISTED LAPAROSCOPIC NEPHRECTOMY;  Surgeon: Milford Cage, MD;  Location: [facility] ORS;  Service: Urology;  Laterality: Right;  Robot Laparoscopic Right Partial Nephrectomy     TEE WITHOUT CARDIOVERSION N/A 05/18/2023   Procedure: TRANSESOPHAGEAL ECHOCARDIOGRAM;  Surgeon: Alleen Borne, MD;  Location: [facility] OR;  Service: Open Heart Surgery;  Laterality: N/A;   TONSILLECTOMY     Family History  Problem Relation Age of Onset   Renal Disease Daughter    Hypertension Daughter    Social History:  reports that he quit smoking about 43 years ago. His smoking use included cigarettes. He has never used smokeless tobacco. He reports current drug use. Frequency: 7.00 times per week. Drug: Marijuana. He reports that he does not drink alcohol. Allergies:  Allergies  Allergen Reactions   Alcohol Other (See Comments)    Pts skin gets red. Ex. Pt used alcohol based deodorant and got red things under arms. & Drinks alcohol notices "from heart on up" everything turns  red.   Chlorthalidone     Dizziness and Syncopal Episode   Medications Prior to Admission  Medication Sig Dispense Refill   albuterol (VENTOLIN HFA) 108 (90 Base) MCG/ACT inhaler Inhale 2 puffs into the lungs 2 (two) times daily.     amLODipine (NORVASC) 5 MG tablet Take 1 tablet (5 mg total) by mouth daily. 90 tablet 3   aspirin EC 81 MG EC tablet Take 1 tablet (81 mg total) by mouth daily.     atorvastatin (LIPITOR) 80 MG tablet Take 1 tablet (80 mg total) by mouth daily. 90 tablet 3   baclofen (LIORESAL) 10 MG tablet Take 1 tablet (10 mg total) by mouth 3 (three) times daily. (Patient taking differently: Take 10 mg by mouth daily.) 90 each 5   finasteride (PROSCAR) 5 MG tablet Take 5 mg by mouth daily.  3   losartan (COZAAR) 100 MG tablet TAKE 1 TABLET BY MOUTH ONCE DAILY. 90 tablet 1   metoprolol succinate (TOPROL-XL) 25 MG 24 hr tablet TAKE 1 TABLET ONCE DAILY. 90 tablet 1      Home: Home Living Family/patient expects to be discharged to:: Private residence Living Arrangements: Spouse/significant other Available Help at Discharge: Available PRN/intermittently Type of  Home: House Home Access: Stairs to enter [employer] of Steps: 4 Home Layout: One level Bathroom Shower/Tub: Health visitor: Standard Bathroom Accessibility: Yes Home Equipment: Agricultural consultant (2 wheels), [employer] - single point, Wheelchair - manual, BSC/3in1  Lives With: Spouse   Functional History: Prior Function Prior Level of Function : Independent/Modified Independent, Driving Mobility Comments: uses cane PRN ADLs Comments: ind, reports it just takes time  Functional Status:  Mobility: Bed Mobility Overal bed mobility: Needs Assistance Bed Mobility: Rolling, Sidelying to Sit Rolling: Min guard Sidelying to sit: Min assist Sit to sidelying: Min assist General bed mobility comments: in chair on arriavl Transfers Overall transfer level: Needs assistance Equipment used:  Rolling walker (2 wheels) Transfers: Sit to/from Stand Sit to Stand: Min assist General transfer comment: cues for hand placement and safety Ambulation/Gait Ambulation/Gait assistance: Min assist Gait Distance (Feet): 125 Feet Assistive device: Rolling walker (2 wheels) Gait Pattern/deviations: Step-through pattern, Decreased stride length, Decreased dorsiflexion - right General Gait Details: Assist for balance and support. Decr clearance of rt foot. Rt knee kept in extension throughout stance Gait velocity: decr Gait velocity interpretation: 1.31 - 2.62 ft/sec, indicative of limited community ambulator    ADL: ADL Overall ADL's : Needs assistance/impaired Eating/Feeding: Set up, Sitting Grooming: Min guard, Standing, Oral care Upper Body Bathing: Moderate assistance, Sitting Lower Body Bathing: Moderate assistance, Sitting/lateral leans, Sit to/from stand Upper Body Dressing : Moderate assistance, Sitting Lower Body Dressing: Moderate assistance, Sitting/lateral leans Toilet Transfer: Minimal assistance, Ambulation, BSC/3in1 Toilet Transfer Details (indicate cue type and reason): pt unable to void bladder sitting into the toilet. pt will need urinal as he states second time he has peed out the front of the [facility]. LB hygiene performed Toileting- Clothing Manipulation and Hygiene: Maximal assistance Toileting - Clothing Manipulation Details (indicate cue type and reason): pt able to doff L sock but requires total (A) for R LE and donning bil socks Functional mobility during ADLs: Minimal assistance, Rolling walker (2 wheels)  Cognition: Cognition Overall Cognitive Status: Impaired/Different from baseline Arousal/Alertness: Awake/alert Orientation Level: Oriented X4 Attention: Sustained, Selective Memory: Impaired Memory Impairment: Decreased recall of new information Awareness: Appears intact Problem Solving: Appears intact Behaviors: Other (comment) (Anxiety; potential/tendancy  depression regarding life changes) Safety/Judgment: Appears intact Cognition Arousal/Alertness: Awake/alert Behavior During Therapy: WFL for tasks assessed/performed Overall Cognitive Status: Impaired/Different from baseline General Comments: Needs repeated cues for sternal precautions.  Physical Exam: Blood pressure 112/78, pulse 70, temperature 98.2 F (36.8 C), temperature source Oral, resp. rate 17, height 5\' 8"  (1.727 m), weight 75.1 kg, SpO2 100 %. Physical Exam Constitutional:      Appearance: Normal appearance.  HENT:     Head: Normocephalic and atraumatic.     Right Ear: External ear normal.     Left Ear: External ear normal.     Nose: Nose normal.     Mouth/Throat:     Mouth: Mucous membranes are moist.  Eyes:     Extraocular Movements: Extraocular movements intact.     Conjunctiva/sclera: Conjunctivae normal.     Pupils: Pupils are equal, round, and reactive to light.  Cardiovascular:     Rate and Rhythm: Normal rate and regular rhythm.     Heart sounds: No murmur heard.    No gallop.  Pulmonary:     Effort: Pulmonary effort is normal. No respiratory distress.     Breath sounds: No wheezing.  Abdominal:     General: There is no distension.     Palpations: Abdomen  is soft.     Tenderness: There is no abdominal tenderness.  Musculoskeletal:     Cervical back: Normal range of motion.  Skin:    Comments: Chest incision CDI  Neurological:     Mental Status: He is alert.     Comments: Alert and oriented x 3. Normal insight and awareness. Intact Memory. Normal language and speech. Cranial nerve exam unremarkable. MMT:.  RUE 3+/5 with decreased FMC with FTN. LUE 4+/5. RLE 4- to 4+/5 prox to distal. LLE 4 to 4+/5 prox to distal. Sensory loss in RUE sl especially on medial side of hand and wrist. ?sensory loss right leg also in patchy pattern. DTR's tr to 1+. No abnl resting tone.   Psychiatric:        Mood and Affect: Mood normal.        Behavior: Behavior normal.      Results for orders placed or performed during the hospital encounter of 05/18/23 (from the past 48 hour(s))  Protime-INR     Status: Abnormal   Collection Time: 05/24/23  1:28 AM  Result Value Ref Range   Prothrombin Time 28.1 (H) 11.4 - 15.2 seconds   INR 2.6 (H) 0.8 - 1.2    Comment: (NOTE) INR goal varies based on device and disease states. Performed at [facility], [address], [place], [place] [address]   Protime-INR     Status: Abnormal   Collection Time: 05/25/23  1:27 AM  Result Value Ref Range   Prothrombin Time 36.4 (H) 11.4 - 15.2 seconds   INR 3.6 (H) 0.8 - 1.2    Comment: (NOTE) INR goal varies based on device and disease states. Performed at [facility], [address], [place], [place] [address]    VAS [place] LOWER EXTREMITY VENOUS (DVT)  Result Date: 05/23/2023  Lower Venous DVT Study Patient Name:  CAANAN VOIT  Date of Exam:   05/23/2023 Medical Rec #: [id number]       Accession #:    [id number] Date of Birth: 06/20/1956      Patient Gender: M Patient Age:   31 years Exam Location:  [facility] Procedure:      VAS [place] LOWER EXTREMITY VENOUS (DVT) Referring Phys: Scheryl Marten XU --------------------------------------------------------------------------------  Indications: Stroke.  Comparison Study: No prior study Performing Technologist: Sherren Kerns RVS  Examination Guidelines: A complete evaluation includes B-mode imaging, spectral Doppler, color Doppler, and power Doppler as needed of all accessible portions of each vessel. Bilateral testing is considered an integral part of a complete examination. Limited examinations for reoccurring indications may be performed as noted. The reflux portion of the exam is performed with the patient in reverse Trendelenburg.  +---------+---------------+---------+-----------+----------+--------------+ RIGHT    CompressibilityPhasicitySpontaneityPropertiesThrombus Aging  +---------+---------------+---------+-----------+----------+--------------+ CFV      Full           Yes      Yes                                 +---------+---------------+---------+-----------+----------+--------------+ SFJ      Full                                                        +---------+---------------+---------+-----------+----------+--------------+ FV Prox  Full                                                        +---------+---------------+---------+-----------+----------+--------------+  FV Mid   Full                                                        +---------+---------------+---------+-----------+----------+--------------+ FV DistalFull                                                        +---------+---------------+---------+-----------+----------+--------------+ PFV      Full                                                        +---------+---------------+---------+-----------+----------+--------------+ POP      Full           Yes      Yes                                 +---------+---------------+---------+-----------+----------+--------------+ PTV      Full                                                        +---------+---------------+---------+-----------+----------+--------------+ PERO     Full                                                        +---------+---------------+---------+-----------+----------+--------------+   +---------+---------------+---------+-----------+----------+--------------+ LEFT     CompressibilityPhasicitySpontaneityPropertiesThrombus Aging +---------+---------------+---------+-----------+----------+--------------+ CFV      Full           Yes      Yes                                 +---------+---------------+---------+-----------+----------+--------------+ SFJ      Full                                                         +---------+---------------+---------+-----------+----------+--------------+ FV Prox  Full                                                        +---------+---------------+---------+-----------+----------+--------------+ FV Mid   Full                                                        +---------+---------------+---------+-----------+----------+--------------+  FV DistalFull                                                        +---------+---------------+---------+-----------+----------+--------------+ PFV      Full                                                        +---------+---------------+---------+-----------+----------+--------------+ POP      Full           Yes      Yes                                 +---------+---------------+---------+-----------+----------+--------------+ PTV      Full                                                        +---------+---------------+---------+-----------+----------+--------------+ PERO     Full                                                        +---------+---------------+---------+-----------+----------+--------------+     Summary: BILATERAL: - No evidence of deep vein thrombosis seen in the lower extremities, bilaterally. -No evidence of popliteal cyst, bilaterally.   *See table(s) above for measurements and observations. Electronically signed by Lemar Livings MD on 05/23/2023 at 3:51:24 PM.    Final       Blood pressure 112/78, pulse 70, temperature 98.2 F (36.8 C), temperature source Oral, resp. rate 17, height 5\' 8"  (1.727 m), weight 75.1 kg, SpO2 100 %.  Medical Problem List and Plan: 1. Functional deficits secondary to embolic bi-cerebral infarcts after AVR/aortic aneurysm repair 05/18/23.  -patient may shower  -ELOS/Goals: 7-10 days, mod I goals with PT and OT  2.  Antithrombotics: -DVT/anticoagulation:  Pharmaceutical: Coumadin  -antiplatelet therapy: Aspirin 81 mg indefinitely  3. Pain  Management. Has long hx of low back pain followed by ortho. Having intermittent radicular-type symptoms in the RLE. Also has had cervical symptoms as well -Tylenol, tramodol, oxycodone as needed  -baclofen 10 mg daily  -consider trial of gabapentin  -encouraged stretching, oob, physical activity to tolerance  4. Mood/Behavior/Sleep: LCSW to evaluate and provide emotional support  -antipsychotic agents: n/a  5. Neuropsych/cognition: This patient is capable of making decisions on his own behalf.  6. Skin/Wound Care: Routine skin care checks  -remove chest tube sutures on 7/12  -routine sternotomy incision care   7. Fluids/Electrolytes/Nutrition: Routine Is and Os and follow-up chemistries  8: Hypertension: monitor TID and prn  -continue Lopressor 12.5 mg Bid  9: Hyperlipidemia: continue statin  10: post-op atrial fibrillation with RVR on Coumadin -continue amiodarone 200 mg daily  11: severe aortic stenosis s/p AVR and aortic root replacement 7/01  -sternal precautions  -follow-up with Dr. Laneta Simmers -TTE in 6  weeks, SBE prophylaxis   12: postop atrial fibrillation:rate controlled; on Coumadin (pharmacy consult)  -follow INR -continue amiodarone  13: ABLA: stable; follow-up CBC  -continue oral iron supplementation  14: azotemia: encourage oral fluids and follow-up BMP  15: GERD: continue PPI  16: ? BPH: continue Proscar 5 mg daily   -monitor voiding patterns    Milinda Antis, PA-C 05/25/2023

## 2023-05-25 NOTE — TOC Transition Note (Signed)
Transition of Care (TOC) - CM/SW Discharge Note Donn Pierini RN, BSN Transitions of Care Unit 4E- RN Case Manager See Treatment Team for direct phone #   Patient Details  Name: LENOX QUIST MRN: [id number] Date of Birth: 1956/10/20  Transition of Care [facility]) CM/SW Contact:  Darrold Span, RN Phone Number: 05/25/2023, 12:23 PM   Clinical Narrative:    TOC notified that pt has bed available for admit today to INPT rehab at [facility]  Attending team and Cardiology have cleared pt to transition to INPT rehab.   Pt agreeable to bed offer with [facility] INPT rehab and will transition later today.   [facility] liaison, Bjorn Loser notified of admit to [name] and will follow for [facility] needs post rehab stay.    Final next level of care: IP Rehab Facility Barriers to Discharge: Barriers Resolved   Patient Goals and CMS Choice CMS Medicare.gov Compare Post Acute Care list provided to:: Patient Choice offered to / list presented to : Patient  Discharge Placement                 [facility] INPT rehab        Discharge Plan and Services Additional resources added to the After Visit Summary for     Discharge Planning Services: CM Consult Post Acute Care Choice: Home Health          DME Arranged: N/A DME Agency: NA       HH Arranged: RN, PT HH Agency: [facility]        Social Determinants of Health (SDOH) Interventions SDOH Screenings   Food Insecurity: No Food Insecurity (05/18/2023)  Housing: Low Risk  (05/18/2023)  Transportation Needs: No Transportation Needs (05/18/2023)  Utilities: Not At Risk (05/18/2023)  Tobacco Use: Medium Risk (05/19/2023)     Readmission Risk Interventions    05/25/2023   12:23 PM  Readmission Risk Prevention Plan  Transportation Screening Complete  Home Care Screening Complete  Medication Review (RN CM) Complete

## 2023-05-25 NOTE — Progress Notes (Signed)
PMR Admission Coordinator Pre-Admission Assessment  Patient: [name] is an 66 y.o., male MRN: [id number] DOB: 10/14/1956 Height: 5' 8" (172.7 cm) Weight: 75.1 kg  Insurance Information HMO:     PPO:      PCP:      IPA:      80/20: yes     OTHER:  PRIMARY: Medicare A and B         Policy#: [id number]  Subscriber:  Phone#: Verified online    Fax#:  Pre-Cert#:       Employer:  Benefits:  Phone #:      Name:  Eff. Date: Parts A  and B effective 08/17/20  Deduct: $1632      Out of Pocket Max:  None      Life Max: N/A  [name]: 100%      SNF: 100 days Outpatient: 80%     Co-Pay: 20% Home Health: 100%      Co-Pay: none DME: 80%     Co-Pay: 20% Providers: patient's choice SECONDARY: Tricare for Life      Policy#: [id number]      Phone#:   Financial Counselor:       Phone#:   The "Data Collection Information Summary" for patients in Inpatient Rehabilitation Facilities with attached "Privacy Act Statement-Health Care Records" was provided and verbally reviewed with: Patient  Emergency Contact Information Contact Information     Name Relation Home Work Mobile   Dorrance,Carolyn Spouse   [phone]       Current Medical History  Patient Admitting Diagnosis: Aortic Aneurysm, CVA  History of Present Illness: The patient is a 66-year-old gentleman with a history of hyperlipidemia, coronary artery disease status post NSTEMI in 2015 with catheterization showing an occluded proximal RCA with right to right collaterals filling the distal vessel that could not be opened, renal cell carcinoma status post partial right nephrectomy in 2013, severe degenerative spine disease, and newly diagnosed severe aortic stenosis and ascending aortic aneurysm who was referred for consideration of surgical treatment.  He reports a several year history of low back pain and leg numbness, pain and weakness that he has seen multiple physicians for.  He had previous lower back surgery in the 1980s and a cervical disc  surgery in 2022.  He reports generalized weakness and fatigue as well as some shortness of breath with activity.  He denies any chest pain or pressure.  He has had some dizziness and reports having blacked out a few times.  He has a lot of difficulty bending over due to profound weakness.  He had a 2D echocardiogram performed on 11/14/2022 due to a heart murmur which he said he had no prior knowledge of.  It showed an indeterminate number of cusps.  There is severe aortic stenosis with a mean gradient of 42 mmHg and a peak gradient of 66 mmHg.  Valve area by VTI was 0.74 cm with a dimensionless index of 0.26.  There is moderate aortic insufficiency.  Left ventricular ejection fraction was 55 to 60% with mild LVH and grade 1 diastolic dysfunction.  Cardiac catheterization on 04/03/2023 showed a known chronic total occlusion of the RCA with right to right collaterals filling the distal vessel.  There is nonobstructive plaquing in the left main, LAD, and left circumflex without obstruction.  Right heart pressures were normal.  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  Aortic valve calcium score was 2289.    There is a 5.1 x 5.0 cm fusiform ascending aortic aneurysm. He presented to [facility] Cardioth[facility]acic Surgery service for repair 05/18/23. Thoracic aortic aneurysm and underwent AVR and replacement of ascending aortic aneurysm and TTE 05/18/23. Pt with post op afib and on 7/4 pr with rt sided weakness and MRI showed scattered small embolic infarcts in bilateral hemispheres and left cerebellum.  Pt. Was seen by PT/OT and they recommended [name] to assist return to PLOF..    Complete NIHSS TOTAL: 4  Patient's medical record from [facility] has been reviewed by the rehabilitation admission coordinator and physician.  Past Medical History  Past Medical History:  Diagnosis Date   Arthritis    Coronary atherosclerosis of  native coronary artery    a. NSTEMI (02/2014):  LHC (02/2014):  Mild disease in LAD and CFX; prox RCA occluded with R-R collats, dist RCA filled by L-R collats, inf HK, EF 55%, LVEDP 15 mmHg.  PCI:  Unsuccessful angioplasty of RCA (late presentation of inf MI) - tx medically.   Essential hypertension    GERD (gastroesophageal reflux disease)    Hyperlipidemia    NSTEMI (non-ST elevated myocardial infarction) (HCC) 03/11/14   Renal cell carcinoma of right kidney (HCC)    Partial nephrectomy in 2013    Has the patient had major surgery during 100 days prior to admission? Yes  Family History   family history includes Hypertension in his daughter; Renal Disease in his daughter.  Current Medications  Current Facility-Administered Medications:    0.9 %  sodium chloride infusion, 250 mL, Intravenous, PRN, Gold, Wayne E, PA-C   amiodarone (PACERONE) tablet 400 mg, 400 mg, Oral, BID, Bartle, Bryan K, MD, 400 mg at 05/24/23 2135   aspirin EC tablet 81 mg, 81 mg, Oral, Daily, 81 mg at 05/24/23 0920 **OR** aspirin chewable tablet 81 mg, 81 mg, Per Tube, Daily, Gold, Wayne E, PA-C, 81 mg at 05/21/23 1030   atorvastatin (LIPITOR) tablet 80 mg, 80 mg, Oral, Daily, Gold, Wayne E, PA-C, 80 mg at 05/24/23 0920   baclofen (LIORESAL) tablet 10 mg, 10 mg, Oral, Daily, Gold, Wayne E, PA-C, 10 mg at 05/24/23 0920   bisacodyl (DULCOLAX) suppository 10 mg, 10 mg, Rectal, Daily PRN, Barrett, Erin R, PA-C, 10 mg at 05/24/23 0920   Fe Fum-Vit C-Vit B12-FA (TRIGELS-F FORTE) capsule 1 capsule, 1 capsule, Oral, QPC breakfast, Gold, Wayne E, PA-C, 1 capsule at 05/24/23 0921   finasteride (PROSCAR) tablet 5 mg, 5 mg, Oral, Daily, Gold, Wayne E, PA-C, 5 mg at 05/24/23 0920   metoprolol tartrate (LOPRESSOR) tablet 12.5 mg, 12.5 mg, Oral, BID, 12.5 mg at 05/24/23 2135 **OR** metoprolol tartrate (LOPRESSOR) 25 mg/10 mL oral suspension 12.5 mg, 12.5 mg, Per Tube, BID, Gold, Wayne E, PA-C, 12.5 mg at 05/20/23 2124   metoprolol  tartrate (LOPRESSOR) injection 2.5-5 mg, 2.5-5 mg, Intravenous, Q2H PRN, Gold, Wayne E, PA-C, 2.5 mg at 05/21/23 0617   nutrition supplement (JUVEN) (JUVEN) powder packet 1 packet, 1 packet, Oral, BID BM, Bartle, Bryan K, MD, 1 packet at 05/24/23 1505   ondansetron (ZOFRAN) injection 4 mg, 4 mg, Intravenous, Q6H PRN, Gold, Wayne E, PA-C, 4 mg at 05/19/23 0852   oxyCODONE (Oxy IR/ROXICODONE) immediate release tablet 5-10 mg, 5-10 mg, Oral, Q3H PRN, Gold, Wayne E, PA-C, 5 mg at 05/19/23 1620   pantoprazole (PROTONIX) EC tablet 40 mg, 40 mg, Oral, Daily, Gold, Wayne E, PA-C, 40 mg at 05/24/23 0920   sodium chloride flush (  NS) 0.9 % injection 3 mL, 3 mL, Intravenous, Q12H, Gold, Wayne E, PA-C, 3 mL at 05/24/23 2137   sodium chloride flush (NS) 0.9 % injection 3 mL, 3 mL, Intravenous, PRN, Gold, Wayne E, PA-C   traMADol (ULTRAM) tablet 50-100 mg, 50-100 mg, Oral, Q4H PRN, Gold, Wayne E, PA-C, 50 mg at 05/21/23 1725   Warfarin - Physician Dosing Inpatient, , Does not apply, q1600, Bartle, Bryan K, MD, Given at 05/23/23 1653  Patients Current Diet:  Diet Order             Diet regular Room service appropriate? Yes; Fluid consistency: Thin  Diet effective now                   Precautions / Restrictions Precautions Precautions: Fall, Sternal Precaution Booklet Issued: Yes (comment) Precaution Comments: sternal precautions Restrictions Weight Bearing Restrictions:  (sternal precautions) RUE Weight Bearing: Weight bearing as tolerated LUE Weight Bearing: Weight bearing as tolerated Other Position/Activity Restrictions: sternal precautions   Has the patient had 2 or more falls or a fall with injury in the past year? No  Prior Activity Level Community (5-7x/wk): Pt. was active in the community PTA  Prior Functional Level Self Care: Did the patient need help bathing, dressing, using the toilet or eating? Independent  Indoor Mobility: Did the patient need assistance with walking from room  to room (with or without device)? Independent  Stairs: Did the patient need assistance with internal or external stairs (with or without device)? Independent  Functional Cognition: Did the patient need help planning regular tasks such as shopping or remembering to take medications? Independent  Patient Information Are you of Hispanic, Latino/a,or Spanish origin?: A. No, not of Hispanic, Latino/a, or Spanish origin What is your race?: A. White Do you need or want an interpreter to communicate with a doctor or health care staff?: 0. No  Patient's Response To:  Health Literacy and Transportation Is the patient able to respond to health literacy and transportation needs?: Yes Health Literacy - How often do you need to have someone help you when you read instructions, pamphlets, or other written material from your doctor or pharmacy?: Never In the past 12 months, has lack of transportation kept you from medical appointments or from getting medications?: No In the past 12 months, has lack of transportation kept you from meetings, work, or from getting things needed for daily living?: No  Home Assistive Devices / Equipment Home Assistive Devices/Equipment: Cane (specify quad or straight), Eyeglasses, Walker (specify type) Home Equipment: Rolling Walker (2 wheels), Cane - single point, Wheelchair - manual, BSC/3in1  Prior Device Use: Indicate devices/aids used by the patient prior to current illness, exacerbation or injury? None of the above  Current Functional Level Cognition  Arousal/Alertness: Awake/alert Overall Cognitive Status: Impaired/Different from baseline Orientation Level: Oriented X4 General Comments: Needs repeated cues for sternal precautions. Attention: Sustained, Selective Memory: Impaired Memory Impairment: Decreased recall of new information Awareness: Appears intact Problem Solving: Appears intact Behaviors: Other (comment) (Anxiety; potential/tendancy depression  regarding life changes) Safety/Judgment: Appears intact    Extremity Assessment (includes Sensation/Coordination)  Upper Extremity Assessment: RUE deficits/detail RUE Deficits / Details: noted to have decreased supination/ pronation, wrist extension limited, decreased digit activation, RUE Sensation: decreased light touch, decreased proprioception RUE Coordination: decreased fine motor, decreased gross motor LUE Sensation: history of peripheral neuropathy, decreased light touch  Lower Extremity Assessment: Defer to PT evaluation RLE Deficits / Details: noted to drag R LE with transfer RLE Sensation:   decreased light touch, history of peripheral neuropathy LLE Deficits / Details: AROM WFL, ankle DF weakness noted pt relates prior to surgery, numbness tingling in feet and lower legs LLE Sensation: decreased light touch, history of peripheral neuropathy    ADLs  Overall ADL's : Needs assistance/impaired Eating/Feeding: Set up, Sitting Grooming: Min guard, Standing, Oral care Upper Body Bathing: Moderate assistance, Sitting Lower Body Bathing: Moderate assistance, Sitting/lateral leans, Sit to/from stand Upper Body Dressing : Moderate assistance, Sitting Lower Body Dressing: Moderate assistance, Sitting/lateral leans Toilet Transfer: Minimal assistance, Ambulation, BSC/3in1 Toilet Transfer Details (indicate cue type and reason): pt unable to void bladder sitting into the toilet. pt will need urinal as he states second time he has peed out the front of the BSC. LB hygiene performed Toileting- Clothing Manipulation and Hygiene: Maximal assistance Toileting - Clothing Manipulation Details (indicate cue type and reason): pt able to doff L sock but requires total (A) for R LE and donning bil socks Functional mobility during ADLs: Minimal assistance, Rolling walker (2 wheels)    Mobility  Overal bed mobility: Needs Assistance Bed Mobility: Rolling, Sidelying to Sit Rolling: Min guard Sidelying  to sit: Min assist Sit to sidelying: Min assist General bed mobility comments: in chair on arriavl    Transfers  Overall transfer level: Needs assistance Equipment used: Rolling walker (2 wheels) Transfers: Sit to/from Stand Sit to Stand: Min assist General transfer comment: cues for hand placement and safety    Ambulation / Gait / Stairs / Wheelchair Mobility  Ambulation/Gait Ambulation/Gait assistance: Min assist Gait Distance (Feet): 125 Feet Assistive device: Rolling walker (2 wheels) Gait Pattern/deviations: Step-through pattern, Decreased stride length, Decreased dorsiflexion - right General Gait Details: Assist for balance and support. Decr clearance of rt foot. Rt knee kept in extension throughout stance Gait velocity: decr Gait velocity interpretation: 1.31 - 2.62 ft/sec, indicative of limited community ambulator    Posture / Balance Balance Overall balance assessment: Needs assistance Sitting-balance support: Feet supported, No upper extremity supported Sitting balance-Leahy Scale: Good Standing balance support: Bilateral upper extremity supported, During functional activity, Reliant on assistive device for balance Standing balance-Leahy Scale: Poor Standing balance comment: walker and min guard for static standing    Special needs/care consideration Skin surgical incision   Previous Home Environment (from acute therapy documentation) Living Arrangements: Spouse/significant other  Lives With: Spouse Available Help at Discharge: Available PRN/intermittently Type of Home: House Home Layout: One level Home Access: Stairs to enter Entrance Stairs-Number of Steps: 4 Bathroom Shower/Tub: Walk-in shower Bathroom Toilet: Standard Bathroom Accessibility: Yes Home Care Services: No  Discharge Living Setting Plans for Discharge Living Setting: Patient's home Type of Home at Discharge: House Discharge Home Layout: One level Discharge Home Access: Stairs to  enter Entrance Stairs-Rails: Can reach both Entrance Stairs-Number of Steps: 2 Discharge Bathroom Shower/Tub: Tub/shower unit Discharge Bathroom Toilet: Handicapped height Discharge Bathroom Accessibility: Yes How Accessible: Accessible via walker, Accessible via wheelchair Does the patient have any problems obtaining your medications?: No  Social/Family/Support Systems Patient Roles: Spouse Contact Information: [phone] Anticipated Caregiver: Carolyn Jarchow Anticipated Caregiver's Contact Information: Pt.'s wife or son can provide supervision Ability/Limitations of Caregiver: Supervision level Caregiver Availability: 24/7 Discharge Plan Discussed with Primary Caregiver: Yes Is Caregiver In Agreement with Plan?: Yes Does Caregiver/Family have Issues with Lodging/Transportation while Pt is in Rehab?: Yes  Goals Patient/Family Goal for Rehab: PT/OT mod I Expected length of stay: 7-10 days Pt/Family Agrees to Admission and willing to participate: Yes Program Orientation Provided & Reviewed with Pt/Caregiver   Including Roles  & Responsibilities: Yes  Decrease burden of Care through IP rehab admission: not anticipated  Possible need for SNF placement upon discharge: not anticipated   Patient Condition: I have reviewed medical records from [facility], spoken with CM, and patient. I met with patient at the bedside for inpatient rehabilitation assessment.  Patient will benefit from ongoing PT and OT, can actively participate in 3 hours of therapy a day 5 days of the week, and can make measurable gains during the admission.  Patient will also benefit from the coordinated team approach during an Inpatient Acute Rehabilitation admission.  The patient will receive intensive therapy as well as Rehabilitation physician, nursing, social worker, and care management interventions.  Due to safety, skin/wound care, disease management, medication administration, pain management, and  patient education the patient requires 24 hour a day rehabilitation nursing.  The patient is currently min A with mobility and basic ADLs.  Discharge setting and therapy post discharge at home with home health is anticipated.  Patient has agreed to participate in the Acute Inpatient Rehabilitation Program and will admit today.  Preadmission Screen Completed By:  Randi [employer] B Luverna Degenhart, 05/25/2023 9:30 AM ______________________________________________________________________   Discussed status with Dr. Swartz on 05/25/23 at 1000  and received approval for admission today.  Admission Coordinator:  Isobelle Tuckett B Mckala Pantaleon, CCC-SLP, time 1037 /Date 05/25/23   Assessment/Plan: Diagnosis: Aortic aneurysm, embolic bilateral CVA Does the need for close, 24 hr/day Medical supervision in concert with the patient's rehab needs make it unreasonable for this patient to be served in a less intensive setting? Yes Co-Morbidities requiring supervision/potential complications: CAD/MI, lumbar spondylosis, HTN, renal cell carcinoma Due to bladder management, bowel management, safety, skin/wound care, disease management, medication administration, pain management, and patient education, does the patient require 24 hr/day rehab nursing? Yes Does the patient require coordinated care of a physician, rehab nurse, PT, OT, and SLP to address physical and functional deficits in the context of the above medical diagnosis(es)? Yes Addressing deficits in the following areas: balance, endurance, locomotion, strength, transferring, bowel/bladder control, bathing, dressing, feeding, grooming, toileting, and psychosocial support Can the patient actively participate in an intensive therapy program of at least 3 hrs of therapy 5 days a week? Yes The potential for patient to make measurable gains while on inpatient rehab is excellent Anticipated functional outcomes upon discharge from inpatient rehab: modified independent PT, modified independent OT, n/a  SLP Estimated rehab length of stay to reach the above functional goals is: 7-10 days Anticipated discharge destination: Home 10. Overall Rehab/Functional Prognosis: excellent   MD Signature: Zachary T. Swartz, MD, FAAPMR [facility] Medical Director Rehabilitation Services 05/25/2023  

## 2023-05-25 NOTE — H&P (Addendum)
Physical Medicine and Rehabilitation Admission H&P       CC: Functional deficits secondary to acute CVA, severe AS, aortic aneurysm   HPI: [name] is a 67 year old male who presented to the [facility] for AVR and ascending aortic aneurysm repair on 05/18/2023. He had evidence of bicuspid aortic valve stenosis with 5.1 cm ascending aortic aneurysm and underwent repair by Dr. Laneta Simmers on 05/18/2023. He was extubated later that day.  He had brief episode of hematuria after difficult Foley placement.  Remains on Proscar for urinary retention.  Intraoperative TEE with EF approximately 50%.  Pacer wires and chest tubes removed on 7/03.  Chest x-ray was stable and he was transferred out to stepdown unit.  Acute blood loss anemia treated with oral iron replacement.  Aspirin dosing decreased to 81 mg due to post-operative thrombocytopenia.  Later in the evening on 7/03 he was noted to have right hand weakness and rapid response was called.  He was noted to have atrial fibrillation with RVR and was hypotensive.  He was started on an amiodarone infusion and cardiology consulted.  He was transition to amiodarone 200 mg twice daily on 7/04.  CT of head without evidence of acute infarct or hemorrhage, however brain MRI showed scattered small acute infarcts in the left cerebellar hemisphere in both cerebral hemispheres likely embolic in etiology.  Neurology was consulted.  He was felt to be a candidate for Coumadin due to the recent new aortic valve as well as postoperative A-fib.  No large vessel occlusion noted.  INR goal is 2.0.  BLE venous duplex negative for DVT on 7/06. Hemoglobin is stable and platelet count is now normal. His incision is healing without signs of infection and he is tolerating his diet. The patient requires inpatient medicine and rehabilitation evaluations and services for ongoing dysfunction secondary to acute CVA, Severe AS, aorotic aneurysm s/p repair.   Past medical history includes for lipidemia,  coronary artery disease status post NSTEMI in 2015 with catheterization showing an occluded proximal RCA with right to right collaterals filling the distal vessel that cannot be opened, renal cell carcinoma status post partial right nephrectomy in 2013, severe degenerative spine disease.  Previous lower back surgery in the 1980s and a cervical disc surgery in 2022.     Review of Systems  Constitutional:  Positive for malaise/fatigue. Negative for fever.  HENT: Negative.    Eyes: Negative.   Respiratory:  Positive for cough and shortness of breath.   Cardiovascular:  Positive for chest pain.  Gastrointestinal: Negative.   Genitourinary: Negative.   Musculoskeletal:  Positive for back pain, joint pain and myalgias.  Skin: Negative.   Neurological:  Positive for sensory change, focal weakness and weakness.  Psychiatric/Behavioral:  Negative for depression.         Past Medical History:  Diagnosis Date   Arthritis     Coronary atherosclerosis of native coronary artery      a. NSTEMI (02/2014):  LHC (02/2014):  Mild disease in LAD and CFX; prox RCA occluded with R-R collats, dist RCA filled by L-R collats, inf HK, EF 55%, LVEDP 15 mmHg.  PCI:  Unsuccessful angioplasty of RCA (late presentation of inf MI) - tx medically.   Essential hypertension     GERD (gastroesophageal reflux disease)     Hyperlipidemia     NSTEMI (non-ST elevated myocardial infarction) (HCC) 03/11/14   Renal cell carcinoma of right kidney (HCC)      Partial nephrectomy in 2013  Past Surgical History:  Procedure Laterality Date   AORTIC VALVE REPLACEMENT N/A 05/18/2023    Procedure: AORTIC VALVE REPLACEMENT (AVR) USING INSPIRIS RESILIA 23 MM AORTIC VALVE;  Surgeon: Alleen Borne, MD;  Location: [facility] OR;  Service: Open Heart Surgery;  Laterality: N/A;   BACK SURGERY        1989   CERVICAL DISC SURGERY   04/29/2021   COLONOSCOPY N/A 12/11/2020    Procedure: COLONOSCOPY;  Surgeon: Franky Macho, MD;  Location: [facility]  ENDO SUITE;  Service: Gastroenterology;  Laterality: N/A;   LEFT HEART CATHETERIZATION WITH CORONARY ANGIOGRAM N/A 03/17/2014    Procedure: LEFT HEART CATHETERIZATION WITH CORONARY ANGIOGRAM;  Surgeon: Corky Crafts, MD;  Location: [facility] CATH LAB;  Service: Cardiovascular;  Laterality: N/A;   POLYPECTOMY   12/11/2020    Procedure: POLYPECTOMY;  Surgeon: Franky Macho, MD;  Location: [facility] ENDO SUITE;  Service: Gastroenterology;;   REPLACEMENT ASCENDING AORTA N/A 05/18/2023    Procedure: REPLACEMENT OF ASCENDING AORTIC ANEURYSM USING A 30 X 10 MM HEMASHIELD PLATINUM GRAFT;  Surgeon: Alleen Borne, MD;  Location: [facility] OR;  Service: Open Heart Surgery;  Laterality: N/A;  CIRC ARREST   RIGHT HEART CATH AND CORONARY ANGIOGRAPHY N/A 04/03/2023    Procedure: RIGHT HEART CATH AND CORONARY ANGIOGRAPHY;  Surgeon: Tonny Bollman, MD;  Location: [facility] INVASIVE CV LAB;  Service: Cardiovascular;  Laterality: N/A;   ROBOT ASSISTED LAPAROSCOPIC NEPHRECTOMY   01/14/2012    Procedure: ROBOTIC ASSISTED LAPAROSCOPIC NEPHRECTOMY;  Surgeon: Milford Cage, MD;  Location: [facility] ORS;  Service: Urology;  Laterality: Right;  Robot Laparoscopic Right Partial Nephrectomy       TEE WITHOUT CARDIOVERSION N/A 05/18/2023    Procedure: TRANSESOPHAGEAL ECHOCARDIOGRAM;  Surgeon: Alleen Borne, MD;  Location: [facility] OR;  Service: Open Heart Surgery;  Laterality: N/A;   TONSILLECTOMY             Family History  Problem Relation Age of Onset   Renal Disease Daughter     Hypertension Daughter      Social History:  reports that he quit smoking about 43 years ago. His smoking use included cigarettes. He has never used smokeless tobacco. He reports current drug use. Frequency: 7.00 times per week. Drug: Marijuana. He reports that he does not drink alcohol. Allergies:       Allergies  Allergen Reactions   Alcohol Other (See Comments)      Pts skin gets red. Ex. Pt used alcohol based deodorant and got red things under arms. & Drinks  alcohol notices "from heart on up" everything turns red.   Chlorthalidone        Dizziness and Syncopal Episode          Medications Prior to Admission  Medication Sig Dispense Refill   albuterol (VENTOLIN HFA) 108 (90 Base) MCG/ACT inhaler Inhale 2 puffs into the lungs 2 (two) times daily.       amLODipine (NORVASC) 5 MG tablet Take 1 tablet (5 mg total) by mouth daily. 90 tablet 3   aspirin EC 81 MG EC tablet Take 1 tablet (81 mg total) by mouth daily.       atorvastatin (LIPITOR) 80 MG tablet Take 1 tablet (80 mg total) by mouth daily. 90 tablet 3   baclofen (LIORESAL) 10 MG tablet Take 1 tablet (10 mg total) by mouth 3 (three) times daily. (Patient taking differently: Take 10 mg by mouth daily.) 90 each 5   finasteride (PROSCAR) 5 MG tablet Take 5 mg  by mouth daily.   3   losartan (COZAAR) 100 MG tablet TAKE 1 TABLET BY MOUTH ONCE DAILY. 90 tablet 1   metoprolol succinate (TOPROL-XL) 25 MG 24 hr tablet TAKE 1 TABLET ONCE DAILY. 90 tablet 1          Home: Home Living Family/patient expects to be discharged to:: Private residence Living Arrangements: Spouse/significant other Available Help at Discharge: Available PRN/intermittently Type of Home: House Home Access: Stairs to enter Secretary/administrator of Steps: 4 Home Layout: One level Bathroom Shower/Tub: Health visitor: Standard Bathroom Accessibility: Yes Home Equipment: Agricultural consultant (2 wheels), [employer] - single point, Wheelchair - manual, BSC/3in1  Lives With: Spouse   Functional History: Prior Function Prior Level of Function : Independent/Modified Independent, Driving Mobility Comments: uses cane PRN ADLs Comments: ind, reports it just takes time   Functional Status:  Mobility: Bed Mobility Overal bed mobility: Needs Assistance Bed Mobility: Rolling, Sidelying to Sit Rolling: Min guard Sidelying to sit: Min assist Sit to sidelying: Min assist General bed mobility comments: in chair on  arriavl Transfers Overall transfer level: Needs assistance Equipment used: Rolling walker (2 wheels) Transfers: Sit to/from Stand Sit to Stand: Min assist General transfer comment: cues for hand placement and safety Ambulation/Gait Ambulation/Gait assistance: Min assist Gait Distance (Feet): 125 Feet Assistive device: Rolling walker (2 wheels) Gait Pattern/deviations: Step-through pattern, Decreased stride length, Decreased dorsiflexion - right General Gait Details: Assist for balance and support. Decr clearance of rt foot. Rt knee kept in extension throughout stance Gait velocity: decr Gait velocity interpretation: 1.31 - 2.62 ft/sec, indicative of limited community ambulator   ADL: ADL Overall ADL's : Needs assistance/impaired Eating/Feeding: Set up, Sitting Grooming: Min guard, Standing, Oral care Upper Body Bathing: Moderate assistance, Sitting Lower Body Bathing: Moderate assistance, Sitting/lateral leans, Sit to/from stand Upper Body Dressing : Moderate assistance, Sitting Lower Body Dressing: Moderate assistance, Sitting/lateral leans Toilet Transfer: Minimal assistance, Ambulation, BSC/3in1 Toilet Transfer Details (indicate cue type and reason): pt unable to void bladder sitting into the toilet. pt will need urinal as he states second time he has peed out the front of the [facility]. LB hygiene performed Toileting- Clothing Manipulation and Hygiene: Maximal assistance Toileting - Clothing Manipulation Details (indicate cue type and reason): pt able to doff L sock but requires total (A) for R LE and donning bil socks Functional mobility during ADLs: Minimal assistance, Rolling walker (2 wheels)   Cognition: Cognition Overall Cognitive Status: Impaired/Different from baseline Arousal/Alertness: Awake/alert Orientation Level: Oriented X4 Attention: Sustained, Selective Memory: Impaired Memory Impairment: Decreased recall of new information Awareness: Appears intact Problem  Solving: Appears intact Behaviors: Other (comment) (Anxiety; potential/tendancy depression regarding life changes) Safety/Judgment: Appears intact Cognition Arousal/Alertness: Awake/alert Behavior During Therapy: WFL for tasks assessed/performed Overall Cognitive Status: Impaired/Different from baseline General Comments: Needs repeated cues for sternal precautions.   Physical Exam: Blood pressure 112/78, pulse 70, temperature 98.2 F (36.8 C), temperature source Oral, resp. rate 17, height 5\' 8"  (1.727 m), weight 75.1 kg, SpO2 100 %. Physical Exam Constitutional:      Appearance: Normal appearance.  HENT:     Head: Normocephalic and atraumatic.     Right Ear: External ear normal.     Left Ear: External ear normal.     Nose: Nose normal.     Mouth/Throat:     Mouth: Mucous membranes are moist.  Eyes:     Extraocular Movements: Extraocular movements intact.     Conjunctiva/sclera: Conjunctivae normal.     Pupils:  Pupils are equal, round, and reactive to light.  Cardiovascular:     Rate and Rhythm: Normal rate and regular rhythm.     Heart sounds: No murmur heard.    No gallop.  Pulmonary:     Effort: Pulmonary effort is normal. No respiratory distress.     Breath sounds: No wheezing.  Abdominal:     General: There is no distension.     Palpations: Abdomen is soft.     Tenderness: There is no abdominal tenderness.  Musculoskeletal:     Cervical back: Normal range of motion.  Skin:    Comments: Chest incision CDI, chest tube sutures  Neurological:     Mental Status: He is alert.     Comments: Alert and oriented x 3. Normal insight and awareness. Intact Memory. Normal language and speech. Cranial nerve exam unremarkable. MMT:.  RUE 3+/5 with decreased FMC with FTN. LUE 4+/5. RLE 4- to 4+/5 prox to distal. LLE 4 to 4+/5 prox to distal. Sensory loss in RUE sl especially on medial side of hand and wrist. ?sensory loss right leg also in patchy pattern. DTR's tr to 1+. No abnl  resting tone.   Psychiatric:        Mood and Affect: Mood normal.        Behavior: Behavior normal.        Lab Results Last 48 Hours        Results for orders placed or performed during the hospital encounter of 05/18/23 (from the past 48 hour(s))  Protime-INR     Status: Abnormal    Collection Time: 05/24/23  1:28 AM  Result Value Ref Range    Prothrombin Time 28.1 (H) 11.4 - 15.2 seconds    INR 2.6 (H) 0.8 - 1.2      Comment: (NOTE) INR goal varies based on device and disease states. Performed at [facility], [address], [place], [place] [address]    Protime-INR     Status: Abnormal    Collection Time: 05/25/23  1:27 AM  Result Value Ref Range    Prothrombin Time 36.4 (H) 11.4 - 15.2 seconds    INR 3.6 (H) 0.8 - 1.2      Comment: (NOTE) INR goal varies based on device and disease states. Performed at [facility], [address], [place], [place] [address]         Imaging Results (Last 48 hours)  VAS [place] LOWER EXTREMITY VENOUS (DVT)   Result Date: 05/23/2023  Lower Venous DVT Study Patient Name:  [name]  Date of Exam:   05/23/2023 Medical Rec #: [id number]       Accession #:    [id number] Date of Birth: 1956-07-12      Patient Gender: M Patient Age:   67 years Exam Location:  [facility] Procedure:      VAS [place] LOWER EXTREMITY VENOUS (DVT) Referring Phys: Scheryl Marten XU --------------------------------------------------------------------------------  Indications: Stroke.  Comparison Study: No prior study Performing Technologist: Sherren Kerns RVS  Examination Guidelines: A complete evaluation includes B-mode imaging, spectral Doppler, color Doppler, and power Doppler as needed of all accessible portions of each vessel. Bilateral testing is considered an integral part of a complete examination. Limited examinations for reoccurring indications may be performed as noted. The reflux portion of the exam is performed with the patient in reverse Trendelenburg.   +---------+---------------+---------+-----------+----------+--------------+ RIGHT    CompressibilityPhasicitySpontaneityPropertiesThrombus Aging +---------+---------------+---------+-----------+----------+--------------+ CFV      Full  Yes      Yes                                 +---------+---------------+---------+-----------+----------+--------------+ SFJ      Full                                                        +---------+---------------+---------+-----------+----------+--------------+ FV Prox  Full                                                        +---------+---------------+---------+-----------+----------+--------------+ FV Mid   Full                                                        +---------+---------------+---------+-----------+----------+--------------+ FV DistalFull                                                        +---------+---------------+---------+-----------+----------+--------------+ PFV      Full                                                        +---------+---------------+---------+-----------+----------+--------------+ POP      Full           Yes      Yes                                 +---------+---------------+---------+-----------+----------+--------------+ PTV      Full                                                        +---------+---------------+---------+-----------+----------+--------------+ PERO     Full                                                        +---------+---------------+---------+-----------+----------+--------------+   +---------+---------------+---------+-----------+----------+--------------+ LEFT     CompressibilityPhasicitySpontaneityPropertiesThrombus Aging +---------+---------------+---------+-----------+----------+--------------+ CFV      Full           Yes      Yes                                  +---------+---------------+---------+-----------+----------+--------------+ SFJ  Full                                                        +---------+---------------+---------+-----------+----------+--------------+ FV Prox  Full                                                        +---------+---------------+---------+-----------+----------+--------------+ FV Mid   Full                                                        +---------+---------------+---------+-----------+----------+--------------+ FV DistalFull                                                        +---------+---------------+---------+-----------+----------+--------------+ PFV      Full                                                        +---------+---------------+---------+-----------+----------+--------------+ POP      Full           Yes      Yes                                 +---------+---------------+---------+-----------+----------+--------------+ PTV      Full                                                        +---------+---------------+---------+-----------+----------+--------------+ PERO     Full                                                        +---------+---------------+---------+-----------+----------+--------------+     Summary: BILATERAL: - No evidence of deep vein thrombosis seen in the lower extremities, bilaterally. -No evidence of popliteal cyst, bilaterally.   *See table(s) above for measurements and observations. Electronically signed by Lemar Livings MD on 05/23/2023 at 3:51:24 PM.    Final            Blood pressure 112/78, pulse 70, temperature 98.2 F (36.8 C), temperature source Oral, resp. rate 17, height 5\' 8"  (1.727 m), weight 75.1 kg, SpO2 100 %.   Medical Problem List and Plan: 1. Functional deficits secondary to embolic bi-cerebral infarcts after AVR/aortic aneurysm repair 05/18/23.             -  patient may shower             -ELOS/Goals:  7-10 days, mod I goals with PT and OT   2.  Antithrombotics: -DVT/anticoagulation:  Pharmaceutical: Coumadin             -antiplatelet therapy: Aspirin 81 mg indefinitely   3. Pain Management. Has long hx of low back pain followed by ortho. Having intermittent radicular-type symptoms in the RLE. Also has had cervical symptoms as well -Tylenol, tramodol, oxycodone as needed             -baclofen 10 mg daily             -consider trial of gabapentin             -encouraged stretching, oob, physical activity to tolerance   4. Mood/Behavior/Sleep: LCSW to evaluate and provide emotional support             -antipsychotic agents: n/a   5. Neuropsych/cognition: This patient is capable of making decisions on his own behalf.   6. Skin/Wound Care: Routine skin care checks             -remove chest tube sutures on 7/12             -routine sternotomy incision care   7. Fluids/Electrolytes/Nutrition: Routine Is and Os and follow-up chemistries   8: Hypertension: monitor TID and prn             -continue Lopressor 12.5 mg Bid   9: Hyperlipidemia: continue statin   11: severe aortic stenosis s/p AVR and aortic root replacement 7/01             -sternal precautions, coumadin             -follow-up with Dr. Laneta Simmers -TTE in 6 weeks, SBE prophylaxis    12: postop atrial fibrillation:rate controlled; on Coumadin (pharmacy consult)             -follow INR -continue amiodarone 200mg  daily   13: ABLA: stable; follow-up CBC             -continue oral iron supplementation   14: azotemia: encourage oral fluids and follow-up BMP   15: GERD: continue PPI   16: ? BPH: continue Proscar 5 mg daily             -monitor voiding patterns       Milinda Antis, PA-C 05/25/2023  I have personally performed a face to face diagnostic evaluation of this patient and formulated the key components of the plan.  Additionally, I have personally reviewed laboratory data, imaging studies, as well as relevant  notes and concur with the physician assistant's documentation above.  The patient's status has not changed from the original H&P.  Any changes in documentation from the acute care chart have been noted above.  Ranelle Oyster, MD, [place] [facility]

## 2023-05-25 NOTE — PMR Pre-admission (Signed)
PMR Admission Coordinator Pre-Admission Assessment  Patient: [name] is an 67 y.o., male MRN: [id number] DOB: [birth year] Height: 5\' 8"  (172.7 cm) Weight: 75.1 kg  Insurance Information HMO:     PPO:      PCP:      IPA:      80/20: yes     OTHER:  PRIMARY: Medicare A and B         Policy#: [id number]  Subscriber:  Phone#: Verified online    Fax#:  Pre-Cert#:       Employer:  Benefits:  Phone #:      Name:  Eff. Date: Parts A  and B effective 08/17/20  Deduct: $1632      Out of Pocket Max:  None      Life Max: N/A  [name]: 100%      SNF: 100 days Outpatient: 80%     Co-Pay: 20% Home Health: 100%      Co-Pay: none DME: 80%     Co-Pay: 20% Providers: patient's choice SECONDARY: Tricare for Life      Policy#: [id number]      Phone#:   Financial Counselor:       Phone#:   The "Data Collection Information Summary" for patients in Inpatient Rehabilitation Facilities with attached "Privacy Act Statement-Health Care Records" was provided and verbally reviewed with: Patient  Emergency Contact Information Contact Information     Name Relation Home Work Mobile   Kalafut,Carolyn Spouse   [phone]       Current Medical History  Patient Admitting Diagnosis: Aortic Aneurysm, CVA  History of Present Illness: The patient is a 67 year old gentleman with a history of hyperlipidemia, coronary artery disease status post NSTEMI in 2015 with catheterization showing an occluded proximal RCA with right to right collaterals filling the distal vessel that could not be opened, renal cell carcinoma status post partial right nephrectomy in 2013, severe degenerative spine disease, and newly diagnosed severe aortic stenosis and ascending aortic aneurysm who was referred for consideration of surgical treatment.  He reports a several year history of low back pain and leg numbness, pain and weakness that he has seen multiple physicians for.  He had previous lower back surgery in the 1980s and a cervical disc  surgery in 2022.  He reports generalized weakness and fatigue as well as some shortness of breath with activity.  He denies any chest pain or pressure.  He has had some dizziness and reports having blacked out a few times.  He has a lot of difficulty bending over due to profound weakness.  He had a 2D echocardiogram performed on 11/14/2022 due to a heart murmur which he said he had no prior knowledge of.  It showed an indeterminate number of cusps.  There is severe aortic stenosis with a mean gradient of 42 mmHg and a peak gradient of 66 mmHg.  Valve area by VTI was 0.74 cm with a dimensionless index of 0.26.  There is moderate aortic insufficiency.  Left ventricular ejection fraction was 55 to 60% with mild LVH and grade 1 diastolic dysfunction.  Cardiac catheterization on 04/03/2023 showed a known chronic total occlusion of the RCA with right to right collaterals filling the distal vessel.  There is nonobstructive plaquing in the left main, LAD, and left circumflex without obstruction.  Right heart pressures were normal.  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  Aortic valve calcium score was 2289.  There is a 5.1 x 5.0 cm fusiform ascending aortic aneurysm. He presented to [facility] service for repair 05/18/23. Thoracic aortic aneurysm and underwent AVR and replacement of ascending aortic aneurysm and TTE 05/18/23. Pt with post op afib and on 7/4 pr with rt sided weakness and MRI showed scattered small embolic infarcts in bilateral hemispheres and left cerebellum.  Pt. Was seen by PT/OT and they recommended [name] to assist return to PLOF.Marland Kitchen    Complete NIHSS TOTAL: 4  Patient's medical record from [facility] has been reviewed by the rehabilitation admission coordinator and physician.  Past Medical History  Past Medical History:  Diagnosis Date   Arthritis    Coronary atherosclerosis of  native coronary artery    a. NSTEMI (02/2014):  LHC (02/2014):  Mild disease in LAD and CFX; prox RCA occluded with R-R collats, dist RCA filled by L-R collats, inf HK, EF 55%, LVEDP 15 mmHg.  PCI:  Unsuccessful angioplasty of RCA (late presentation of inf MI) - tx medically.   Essential hypertension    GERD (gastroesophageal reflux disease)    Hyperlipidemia    NSTEMI (non-ST elevated myocardial infarction) (HCC) 03/11/14   Renal cell carcinoma of right kidney (HCC)    Partial nephrectomy in 2013    Has the patient had major surgery during 100 days prior to admission? Yes  Family History   family history includes Hypertension in his daughter; Renal Disease in his daughter.  Current Medications  Current Facility-Administered Medications:    0.9 %  sodium chloride infusion, 250 mL, Intravenous, PRN, Gold, Wayne E, PA-C   amiodarone (PACERONE) tablet 400 mg, 400 mg, Oral, BID, Alleen Borne, MD, 400 mg at 05/24/23 2135   aspirin EC tablet 81 mg, 81 mg, Oral, Daily, 81 mg at 05/24/23 0920 **OR** aspirin chewable tablet 81 mg, 81 mg, Per Tube, Daily, Gold, Wayne E, PA-C, 81 mg at 05/21/23 1030   atorvastatin (LIPITOR) tablet 80 mg, 80 mg, Oral, Daily, Gold, Wayne E, PA-C, 80 mg at 05/24/23 0920   baclofen (LIORESAL) tablet 10 mg, 10 mg, Oral, Daily, Gold, Wayne E, PA-C, 10 mg at 05/24/23 0920   bisacodyl (DULCOLAX) suppository 10 mg, 10 mg, Rectal, Daily PRN, Barrett, Erin R, PA-C, 10 mg at 05/24/23 0920   Fe Fum-Vit C-Vit B12-FA (TRIGELS-F FORTE) capsule 1 capsule, 1 capsule, Oral, QPC breakfast, Gold, Wayne E, PA-C, 1 capsule at 05/24/23 3244   finasteride (PROSCAR) tablet 5 mg, 5 mg, Oral, Daily, Gold, Wayne E, PA-C, 5 mg at 05/24/23 0920   metoprolol tartrate (LOPRESSOR) tablet 12.5 mg, 12.5 mg, Oral, BID, 12.5 mg at 05/24/23 2135 **OR** metoprolol tartrate (LOPRESSOR) 25 mg/10 mL oral suspension 12.5 mg, 12.5 mg, Per Tube, BID, Gold, Wayne E, PA-C, 12.5 mg at 05/20/23 2124   metoprolol  tartrate (LOPRESSOR) injection 2.5-5 mg, 2.5-5 mg, Intravenous, Q2H PRN, Gold, Wayne E, PA-C, 2.5 mg at 05/21/23 0102   nutrition supplement (JUVEN) (JUVEN) powder packet 1 packet, 1 packet, Oral, BID BM, Alleen Borne, MD, 1 packet at 05/24/23 1505   ondansetron (ZOFRAN) injection 4 mg, 4 mg, Intravenous, Q6H PRN, Gold, Wayne E, PA-C, 4 mg at 05/19/23 7253   oxyCODONE (Oxy IR/ROXICODONE) immediate release tablet 5-10 mg, 5-10 mg, Oral, Q3H PRN, Gold, Wayne E, PA-C, 5 mg at 05/19/23 1620   pantoprazole (PROTONIX) EC tablet 40 mg, 40 mg, Oral, Daily, Gold, Wayne E, PA-C, 40 mg at 05/24/23 0920   sodium chloride flush (  NS) 0.9 % injection 3 mL, 3 mL, Intravenous, Q12H, Gold, Wayne E, PA-C, 3 mL at 05/24/23 2137   sodium chloride flush (NS) 0.9 % injection 3 mL, 3 mL, Intravenous, PRN, Gold, Wayne E, PA-C   traMADol (ULTRAM) tablet 50-100 mg, 50-100 mg, Oral, Q4H PRN, Gold, Wayne E, PA-C, 50 mg at 05/21/23 1725   Warfarin - Physician Dosing Inpatient, , Does not apply, q1600, Alleen Borne, MD, Given at 05/23/23 1653  Patients Current Diet:  Diet Order             Diet regular Room service appropriate? Yes; Fluid consistency: Thin  Diet effective now                   Precautions / Restrictions Precautions Precautions: Fall, Sternal Precaution Booklet Issued: Yes (comment) Precaution Comments: sternal precautions Restrictions Weight Bearing Restrictions:  (sternal precautions) RUE Weight Bearing: Weight bearing as tolerated LUE Weight Bearing: Weight bearing as tolerated Other Position/Activity Restrictions: sternal precautions   Has the patient had 2 or more falls or a fall with injury in the past year? No  Prior Activity Level Community (5-7x/wk): Pt. was active in the community PTA  Prior Functional Level Self Care: Did the patient need help bathing, dressing, using the toilet or eating? Independent  Indoor Mobility: Did the patient need assistance with walking from room  to room (with or without device)? Independent  Stairs: Did the patient need assistance with internal or external stairs (with or without device)? Independent  Functional Cognition: Did the patient need help planning regular tasks such as shopping or remembering to take medications? Independent  Patient Information Are you of Hispanic, Latino/a,or Spanish origin?: A. No, not of Hispanic, Latino/a, or Spanish origin What is your race?: A. White Do you need or want an interpreter to communicate with a doctor or health care staff?: 0. No  Patient's Response To:  Health Literacy and Transportation Is the patient able to respond to health literacy and transportation needs?: Yes Health Literacy - How often do you need to have someone help you when you read instructions, pamphlets, or other written material from your doctor or pharmacy?: Never In the past 12 months, has lack of transportation kept you from medical appointments or from getting medications?: No In the past 12 months, has lack of transportation kept you from meetings, work, or from getting things needed for daily living?: No  Home Assistive Devices / Equipment Home Assistive Devices/Equipment: Medical laboratory scientific officer (specify quad or straight), Eyeglasses, Environmental consultant (specify type) Home Equipment: Agricultural consultant (2 wheels), [employer] - single point, Wheelchair - manual, BSC/3in1  Prior Device Use: Indicate devices/aids used by the patient prior to current illness, exacerbation or injury? None of the above  Current Functional Level Cognition  Arousal/Alertness: Awake/alert Overall Cognitive Status: Impaired/Different from baseline Orientation Level: Oriented X4 General Comments: Needs repeated cues for sternal precautions. Attention: Sustained, Selective Memory: Impaired Memory Impairment: Decreased recall of new information Awareness: Appears intact Problem Solving: Appears intact Behaviors: Other (comment) (Anxiety; potential/tendancy depression  regarding life changes) Safety/Judgment: Appears intact    Extremity Assessment (includes Sensation/Coordination)  Upper Extremity Assessment: RUE deficits/detail RUE Deficits / Details: noted to have decreased supination/ pronation, wrist extension limited, decreased digit activation, RUE Sensation: decreased light touch, decreased proprioception RUE Coordination: decreased fine motor, decreased gross motor LUE Sensation: history of peripheral neuropathy, decreased light touch  Lower Extremity Assessment: Defer to PT evaluation RLE Deficits / Details: noted to drag R LE with transfer RLE Sensation:  decreased light touch, history of peripheral neuropathy LLE Deficits / Details: AROM WFL, ankle DF weakness noted pt relates prior to surgery, numbness tingling in feet and lower legs LLE Sensation: decreased light touch, history of peripheral neuropathy    ADLs  Overall ADL's : Needs assistance/impaired Eating/Feeding: Set up, Sitting Grooming: Min guard, Standing, Oral care Upper Body Bathing: Moderate assistance, Sitting Lower Body Bathing: Moderate assistance, Sitting/lateral leans, Sit to/from stand Upper Body Dressing : Moderate assistance, Sitting Lower Body Dressing: Moderate assistance, Sitting/lateral leans Toilet Transfer: Minimal assistance, Ambulation, BSC/3in1 Toilet Transfer Details (indicate cue type and reason): pt unable to void bladder sitting into the toilet. pt will need urinal as he states second time he has peed out the front of the [facility]. LB hygiene performed Toileting- Clothing Manipulation and Hygiene: Maximal assistance Toileting - Clothing Manipulation Details (indicate cue type and reason): pt able to doff L sock but requires total (A) for R LE and donning bil socks Functional mobility during ADLs: Minimal assistance, Rolling walker (2 wheels)    Mobility  Overal bed mobility: Needs Assistance Bed Mobility: Rolling, Sidelying to Sit Rolling: Min guard Sidelying  to sit: Min assist Sit to sidelying: Min assist General bed mobility comments: in chair on arriavl    Transfers  Overall transfer level: Needs assistance Equipment used: Rolling walker (2 wheels) Transfers: Sit to/from Stand Sit to Stand: Min assist General transfer comment: cues for hand placement and safety    Ambulation / Gait / Stairs / Wheelchair Mobility  Ambulation/Gait Ambulation/Gait assistance: Editor, commissioning (Feet): 125 Feet Assistive device: Rolling walker (2 wheels) Gait Pattern/deviations: Step-through pattern, Decreased stride length, Decreased dorsiflexion - right General Gait Details: Assist for balance and support. Decr clearance of rt foot. Rt knee kept in extension throughout stance Gait velocity: decr Gait velocity interpretation: 1.31 - 2.62 ft/sec, indicative of limited community ambulator    Posture / Balance Balance Overall balance assessment: Needs assistance Sitting-balance support: Feet supported, No upper extremity supported Sitting balance-Leahy Scale: Good Standing balance support: Bilateral upper extremity supported, During functional activity, Reliant on assistive device for balance Standing balance-Leahy Scale: Poor Standing balance comment: walker and min guard for static standing    Special needs/care consideration Skin surgical incision   Previous Home Environment (from acute therapy documentation) Living Arrangements: Spouse/significant other  Lives With: Spouse Available Help at Discharge: Available PRN/intermittently Type of Home: House Home Layout: One level Home Access: Stairs to enter Secretary/administrator of Steps: 4 Bathroom Shower/Tub: Health visitor: Pharmacist, community: Yes Home Care Services: No  Discharge Living Setting Plans for Discharge Living Setting: Patient's home Type of Home at Discharge: House Discharge Home Layout: One level Discharge Home Access: Stairs to  enter Entrance Stairs-Rails: Can reach both Entrance Stairs-Number of Steps: 2 Discharge Bathroom Shower/Tub: Tub/shower unit Discharge Bathroom Toilet: Handicapped height Discharge Bathroom Accessibility: Yes How Accessible: Accessible via walker, Accessible via wheelchair Does the patient have any problems obtaining your medications?: No  Social/Family/Support Systems Patient Roles: Spouse Contact Information: [phone] Anticipated Caregiver: Elgar [place] Anticipated Caregiver's Contact Information: Pt.'s wife or son can provide supervision Ability/Limitations of Caregiver: Supervision level Caregiver Availability: 24/7 Discharge Plan Discussed with Primary Caregiver: Yes Is Caregiver In Agreement with Plan?: Yes Does Caregiver/Family have Issues with Lodging/Transportation while Pt is in Rehab?: Yes  Goals Patient/Family Goal for Rehab: PT/OT mod I Expected length of stay: 7-10 days Pt/Family Agrees to Admission and willing to participate: Yes Program Orientation Provided & Reviewed with Pt/Caregiver  Including Roles  & Responsibilities: Yes  Decrease burden of Care through IP rehab admission: not anticipated  Possible need for SNF placement upon discharge: not anticipated   Patient Condition: I have reviewed medical records from [facility], spoken with CM, and patient. I met with patient at the bedside for inpatient rehabilitation assessment.  Patient will benefit from ongoing PT and OT, can actively participate in 3 hours of therapy a day 5 days of the week, and can make measurable gains during the admission.  Patient will also benefit from the coordinated team approach during an Inpatient Acute Rehabilitation admission.  The patient will receive intensive therapy as well as Rehabilitation physician, nursing, social worker, and care management interventions.  Due to safety, skin/wound care, disease management, medication administration, pain management, and  patient education the patient requires 24 hour a day rehabilitation nursing.  The patient is currently min A with mobility and basic ADLs.  Discharge setting and therapy post discharge at home with home health is anticipated.  Patient has agreed to participate in the Acute Inpatient Rehabilitation Program and will admit today.  Preadmission Screen Completed By:  Jeronimo Greaves, 05/25/2023 9:30 AM ______________________________________________________________________   Discussed status with Dr. Riley Kill on 05/25/23 at 1000  and received approval for admission today.  Admission Coordinator:  Jeronimo Greaves, CCC-SLP, time 1610 /Date 05/25/23   Assessment/Plan: Diagnosis: Aortic aneurysm, embolic bilateral CVA Does the need for close, 24 hr/day Medical supervision in concert with the patient's rehab needs make it unreasonable for this patient to be served in a less intensive setting? Yes Co-Morbidities requiring supervision/potential complications: CAD/MI, lumbar spondylosis, HTN, renal cell carcinoma Due to bladder management, bowel management, safety, skin/wound care, disease management, medication administration, pain management, and patient education, does the patient require 24 hr/day rehab nursing? Yes Does the patient require coordinated care of a physician, rehab nurse, PT, OT, and SLP to address physical and functional deficits in the context of the above medical diagnosis(es)? Yes Addressing deficits in the following areas: balance, endurance, locomotion, strength, transferring, bowel/bladder control, bathing, dressing, feeding, grooming, toileting, and psychosocial support Can the patient actively participate in an intensive therapy program of at least 3 hrs of therapy 5 days a week? Yes The potential for patient to make measurable gains while on inpatient rehab is excellent Anticipated functional outcomes upon discharge from inpatient rehab: modified independent PT, modified independent OT, n/a  SLP Estimated rehab length of stay to reach the above functional goals is: 7-10 days Anticipated discharge destination: Home 10. Overall Rehab/Functional Prognosis: excellent   MD Signature: Ranelle Oyster, MD, [facility] Physical Medicine & Rehabilitation Medical Director Rehabilitation Services 05/25/2023

## 2023-05-25 NOTE — Progress Notes (Signed)
Inpatient Rehab Admissions Coordinator:  ° °I have a [name] bed for this Pt. Today. RN may call report to [phone]. ° °Jora Galluzzo, MS, CCC-SLP °Rehab Admissions Coordinator  °[phone] (celll) °[phone] (office) ° °

## 2023-05-25 NOTE — Telephone Encounter (Addendum)
   Our team is rounding on this patient in the hospital post aortic valve replacement and he requested we help him cancel his 7/19 appointment coming up with Dr. Vassie Loll with pulmonology. He would like the dust to settle post surgery before he follows up. I cancelled the appointment in Epic. I will route this msg to Dr. Vassie Loll so he is aware.

## 2023-05-26 DIAGNOSIS — I6389 Other cerebral infarction: Secondary | ICD-10-CM

## 2023-05-26 LAB — COMPREHENSIVE METABOLIC PANEL
ALT: 27 U/L (ref 0–44)
AST: 20 U/L (ref 15–41)
Albumin: 2.9 g/dL — ABNORMAL LOW (ref 3.5–5.0)
Alkaline Phosphatase: 56 U/L (ref 38–126)
Anion gap: 10 (ref 5–15)
BUN: 29 mg/dL — ABNORMAL HIGH (ref 8–23)
CO2: 22 mmol/L (ref 22–32)
Calcium: 8.5 mg/dL — ABNORMAL LOW (ref 8.9–10.3)
Chloride: 105 mmol/L (ref 98–111)
Creatinine, Ser: 0.93 mg/dL (ref 0.61–1.24)
GFR, Estimated: >60 mL/min (ref >60)
Glucose, Bld: 104 mg/dL — ABNORMAL HIGH (ref 70–99)
Potassium: 4.3 mmol/L (ref 3.5–5.1)
Sodium: 137 mmol/L (ref 135–145)
Total Bilirubin: 1.1 mg/dL (ref 0.3–1.2)
Total Protein: 5.4 g/dL — ABNORMAL LOW (ref 6.5–8.1)

## 2023-05-26 LAB — CBC WITH DIFFERENTIAL/PLATELET
Abs Immature Granulocytes: 0.06 10*3/uL (ref 0.00–0.07)
Basophils Absolute: 0 10*3/uL (ref 0.0–0.1)
Basophils Relative: 0 %
Eosinophils Absolute: 0.1 10*3/uL (ref 0.0–0.5)
Eosinophils Relative: 1 %
HCT: 26.8 % — ABNORMAL LOW (ref 39.0–52.0)
Hemoglobin: 8.5 g/dL — ABNORMAL LOW (ref 13.0–17.0)
Immature Granulocytes: 1 %
Lymphocytes Relative: 14 %
Lymphs Abs: 1.2 10*3/uL (ref 0.7–4.0)
MCH: 30 pg (ref 26.0–34.0)
MCHC: 31.7 g/dL (ref 30.0–36.0)
MCV: 94.7 fL (ref 80.0–100.0)
Monocytes Absolute: 0.5 10*3/uL (ref 0.1–1.0)
Monocytes Relative: 6 %
Neutro Abs: 6.9 10*3/uL (ref 1.7–7.7)
Neutrophils Relative %: 78 %
Platelets: 247 10*3/uL (ref 150–400)
RBC: 2.83 MIL/uL — ABNORMAL LOW (ref 4.22–5.81)
RDW: 15.9 % — ABNORMAL HIGH (ref 11.5–15.5)
WBC: 8.8 10*3/uL (ref 4.0–10.5)
nRBC: 0 % (ref 0.0–0.2)

## 2023-05-26 LAB — PROTIME-INR
INR: 2.3 — ABNORMAL HIGH (ref 0.8–1.2)
Prothrombin Time: 25.7 seconds — ABNORMAL HIGH (ref 11.4–15.2)

## 2023-05-26 MED ORDER — POLYETHYLENE GLYCOL 3350 17 G PO PACK
17.0000 g | PACK | Freq: Every day | ORAL | Status: DC
  Administered 2023-05-27 – 2023-06-04 (×9): 17 g via ORAL
  Filled 2023-05-26 (×9): qty 1

## 2023-05-26 MED ORDER — WARFARIN - PHARMACIST DOSING INPATIENT: Freq: Every day | Status: DC

## 2023-05-26 MED ORDER — BISACODYL 10 MG RE SUPP: 10.0000 mg | Freq: Every day | RECTAL | Status: DC | PRN

## 2023-05-26 MED ORDER — AMIODARONE HCL 200 MG PO TABS
200.0000 mg | ORAL_TABLET | Freq: Every day | ORAL | Status: DC
  Administered 2023-05-27 – 2023-06-04 (×9): 200 mg via ORAL
  Filled 2023-05-26 (×9): qty 1

## 2023-05-26 MED ORDER — BISACODYL 5 MG PO TBEC
5.0000 mg | DELAYED_RELEASE_TABLET | Freq: Every day | ORAL | Status: DC
  Administered 2023-05-27 – 2023-06-03 (×8): 5 mg via ORAL
  Filled 2023-05-26 (×8): qty 1

## 2023-05-26 MED ORDER — AMIODARONE HCL 200 MG PO TABS
400.0000 mg | ORAL_TABLET | Freq: Two times a day (BID) | ORAL | Status: AC
  Administered 2023-05-26: 400 mg via ORAL
  Filled 2023-05-26: qty 2

## 2023-05-26 MED ORDER — WARFARIN SODIUM 2.5 MG PO TABS
2.5000 mg | ORAL_TABLET | Freq: Once | ORAL | Status: AC
  Administered 2023-05-26: 2.5 mg via ORAL
  Filled 2023-05-26: qty 1

## 2023-05-26 NOTE — Progress Notes (Addendum)
ANTICOAGULATION CONSULT NOTE - Initial Consult  Pharmacy Consult for Warfarin Indication:  CVA  Allergies  Allergen Reactions   Alcohol Other (See Comments)    Pts skin gets red. Ex. Pt used alcohol based deodorant and got red things under arms. & Drinks alcohol notices "from heart on up" everything turns red.   Chlorthalidone     Dizziness and Syncopal Episode    Patient Measurements: Height: 5\' 8"  (172.7 cm) Weight: 74.3 kg (163 lb 14.4 oz) IBW/kg (Calculated) : 68.4  Vital Signs: Temp: 98.1 F (36.7 C) (07/09 0507) Temp Source: Oral (07/09 0507) BP: 150/89 (07/09 0507) Pulse Rate: 76 (07/09 0507)  Labs: Recent Labs    05/24/23 0128 05/25/23 0127 05/26/23 0544  HGB  --   --  8.5*  HCT  --   --  26.8*  PLT  --   --  247  LABPROT 28.1* 36.4* 25.7*  INR 2.6* 3.6* 2.3*  CREATININE  --   --  0.93    Estimated Creatinine Clearance: 74.6 mL/min (by C-G formula based on SCr of 0.93 mg/dL).   Medical History: Past Medical History:  Diagnosis Date   Arthritis    Coronary atherosclerosis of native coronary artery    a. NSTEMI (02/2014):  LHC (02/2014):  Mild disease in LAD and CFX; prox RCA occluded with R-R collats, dist RCA filled by L-R collats, inf HK, EF 55%, LVEDP 15 mmHg.  PCI:  Unsuccessful angioplasty of RCA (late presentation of inf MI) - tx medically.   Essential hypertension    GERD (gastroesophageal reflux disease)    Hyperlipidemia    NSTEMI (non-ST elevated myocardial infarction) (HCC) 03/11/14   Renal cell carcinoma of right kidney (HCC)    Partial nephrectomy in 2013    Medications:  Scheduled:   amiodarone  400 mg Oral BID   Followed by   Melene Muller ON 05/27/2023] amiodarone  200 mg Oral Daily   aspirin EC  81 mg Oral Daily   Or   aspirin  81 mg Per Tube Daily   atorvastatin  80 mg Oral Daily   baclofen  10 mg Oral Daily   Fe Fum-Vit C-Vit B12-FA  1 capsule Oral QPC breakfast   finasteride  5 mg Oral Daily   metoprolol tartrate  12.5 mg Oral BID    nutrition supplement (JUVEN)  1 packet Oral BID BM   pantoprazole  40 mg Oral Daily   Warfarin - Pharmacist Dosing Inpatient   Does not apply q1600    Assessment: [name] who was initiated on warfarin by TCTS MD on 05/22/23 during prior inpatient admission and managed by TCTS MD. Once transferred to rehab, confirmed with TCTS that pharmacy will now be managing warfarin.  While inpatient, patient received 5mg  dose on 7/5, 5mg  dose on 7/6, and 2.5mg  dose on 7/7. INR increased from 2.6 to 3.6 on 7/8, which resulted in holding warfarin dose last night. INR today is therapeutic at 2.3 with no s/sx of bleeding.  Of note, patient is currently receiving amiodarone 400mg  BID, which will decrease to 200mg  daily tomorrow (7/10). Amiodarone can increase warfarin exposure and INR, therefore pharmacy will closely monitor this drug interaction and adjust dosing as necessary.  Goal of Therapy:  INR 2-3 Monitor platelets by anticoagulation protocol: Yes   Plan:  Give warfarin 2.5mg  PO x1 Daily PT/INR Monitor H/H, platelets, and s/sx of bleeding  [name], PharmD PGY1 Pharmacy Resident 05/26/2023 10:58 AM

## 2023-05-26 NOTE — Progress Notes (Signed)
Orthopedic Tech Progress Note Patient Details:  [name] 1956-09-03 [id number]  Called in order to [facility] for a REHAB COMBO   Patient ID: [name], male   DOB: [birth year], 67 y.o.   MRN: [id number]  [name] 05/26/2023, 9:51 AM

## 2023-05-26 NOTE — Progress Notes (Signed)
Informed of complaints of constipation by RN. Agreeable to Dulcolax suppository (prn) and will schedule one dose each of Miralax and Dulcolax tab daily.

## 2023-05-26 NOTE — Discharge Instructions (Addendum)
Inpatient Rehab Discharge Instructions  [name] Discharge date and time: 06/04/2023  Activities/Precautions/ Functional Status: Activity: no lifting, driving, or strenuous exercise until cleared by MD Diet: cardiac diet Wound Care: keep wound clean and dry Functional status:  ___ No restrictions     ___ Walk up steps independently ___ 24/7 supervision/assistance   ___ Walk up steps with assistance _x__ Intermittent supervision/assistance  ___ Bathe/dress independently ___ Walk with walker     ___ Bathe/dress with assistance ___ Walk Independently    ___ Shower independently ___ Walk with assistance    _x__ Shower with assistance __x_ No alcohol     ___ Return to work/school ________COMMUNITY REFERRALS UPON DISCHARGE:    Outpatient: PT                 Agency:[facility]  Phone:[phone]              Appointment Date/Time: TBD   Special Instructions: No driving, alcohol consumption or tobacco use. Continue sternal precautions.  Recommend daily BP measurement in same arm and record time of day. Bring this information with you to follow-up appointment with PCP.    STROKE/TIA DISCHARGE INSTRUCTIONS SMOKING Cigarette smoking nearly doubles your risk of having a stroke & is the single most alterable risk factor  If you smoke or have smoked in the last 12 months, you are advised to quit smoking for your health. Most of the excess cardiovascular risk related to smoking disappears within a year of stopping. Ask you doctor about anti-smoking medications [place] Quit Line: 1-800-QUIT NOW Free Smoking Cessation Classes [phone]  CHOLESTEROL Know your levels; limit fat & cholesterol in your diet  Lipid Panel     Component Value Date/Time   CHOL 77 05/22/2023 0115   CHOL 139 12/04/2022 0856   TRIG 88 05/22/2023 0115   HDL 31 (L) 05/22/2023 0115   HDL 48 12/04/2022 0856   CHOLHDL 2.5 05/22/2023 0115   VLDL 18 05/22/2023 0115   LDLCALC 28 05/22/2023 0115   LDLCALC 75  12/04/2022 0856   LDLCALC 59 09/20/2020 0820     Many patients benefit from treatment even if their cholesterol is at goal. Goal: Total Cholesterol (CHOL) less than 160 Goal:  Triglycerides (TRIG) less than 150 Goal:  HDL greater than 40 Goal:  LDL (LDLCALC) less than 100   BLOOD PRESSURE American Stroke Association blood pressure target is less that 120/80 mm/Hg  Your discharge blood pressure is:  BP: (!) 131/95 Monitor your blood pressure Limit your salt and alcohol intake Many individuals will require more than one medication for high blood pressure  DIABETES (A1c is a blood sugar average for last 3 months) Goal HGBA1c is under 7% (HBGA1c is blood sugar average for last 3 months)  Diabetes: No known diagnosis of diabetes    Lab Results  Component Value Date   HGBA1C 5.8 (H) 05/14/2023    Your HGBA1c can be lowered with medications, healthy diet, and exercise. Check your blood sugar as directed by your physician Call your physician if you experience unexplained or low blood sugars.  PHYSICAL ACTIVITY/REHABILITATION Goal is 30 minutes at least 4 days per week  Activity: Increase activity slowly, Therapies: Physical Therapy: Outpatient and Occupational Therapy: Outpatient Return to work: when cleared by MD Activity decreases your risk of heart attack and stroke and makes your heart stronger.  It helps control your weight and blood pressure; helps you relax and can improve your mood. Participate in a regular exercise program.  Talk with your doctor about the best form of exercise for you (dancing, walking, swimming, cycling).  DIET/WEIGHT Goal is to maintain a healthy weight  Your discharge diet is:  Diet Order             Diet regular Room service appropriate? Yes; Fluid consistency: Thin  Diet effective now                  thin liquids Your height is:  Height: 5\' 8"  (172.7 cm) Your current weight is: Weight: 75.2 kg Your Body Mass Index (BMI) is:  BMI (Calculated):  25.21 Following the type of diet specifically designed for you will help prevent another stroke. Your goal weight range is:   Your goal Body Mass Index (BMI) is 19-24. Healthy food habits can help reduce 3 risk factors for stroke:  High cholesterol, hypertension, and excess weight.  RESOURCES Stroke/Support Group:  Call [phone]   STROKE EDUCATION PROVIDED/REVIEWED AND GIVEN TO PATIENT Stroke warning signs and symptoms How to activate emergency medical system (call 911). Medications prescribed at discharge. Need for follow-up after discharge. Personal risk factors for stroke. Pneumonia vaccine given: No Flu vaccine given: No My questions have been answered, the writing is legible, and I understand these instructions.  I will adhere to these goals & educational materials that have been provided to me after my discharge from the hospital.     My questions have been answered and I understand these instructions. I will adhere to these goals and the provided educational materials after my discharge from the hospital.  Patient/Caregiver Signature _______________________________ Date __________  Clinician Signature _______________________________________ Date __________  Please bring this form and your medication list with you to all your follow-up doctor's appointments.    ============================================================================= Information on my medicine - Coumadin   (Warfarin)  This medication education was reviewed with me or my healthcare representative as part of my discharge preparation.    Why was Coumadin prescribed for you? Coumadin was prescribed for you because you have a blood clot or a medical condition that can cause an increased risk of forming blood clots. Blood clots can cause serious health problems by blocking the flow of blood to the heart, lung, or brain. Coumadin can prevent harmful blood clots from forming. As a reminder your indication for  Coumadin is:  Stroke Prevention because of Atrial Fibrillation  What test will check on my response to Coumadin? While on Coumadin (warfarin) you will need to have an INR test regularly to ensure that your dose is keeping you in the desired range. The INR (international normalized ratio) number is calculated from the result of the laboratory test called prothrombin time (PT).  If an INR APPOINTMENT HAS NOT ALREADY BEEN MADE FOR YOU please schedule an appointment to have this lab work done by your health care provider within 7 days. Your INR goal is usually a number between:  2 to 3 or your provider may give you a more narrow range like 2-2.5.  Ask your health care provider during an office visit what your goal INR is.  What  do you need to  know  About  COUMADIN? Take Coumadin (warfarin) exactly as prescribed by your healthcare provider about the same time each day.  DO NOT stop taking without talking to the doctor who prescribed the medication.  Stopping without other blood clot prevention medication to take the place of Coumadin may increase your risk of developing a new clot or stroke.  Get refills  before you run out.  What do you do if you miss a dose? If you miss a dose, take it as soon as you remember on the same day then continue your regularly scheduled regimen the next day.  Do not take two doses of Coumadin at the same time.  Important Safety Information A possible side effect of Coumadin (Warfarin) is an increased risk of bleeding. You should call your healthcare provider right away if you experience any of the following: Bleeding from an injury or your nose that does not stop. Unusual colored urine (red or dark brown) or unusual colored stools (red or black). Unusual bruising for unknown reasons. A serious fall or if you hit your head (even if there is no bleeding).  Some foods or medicines interact with Coumadin (warfarin) and might alter your response to warfarin. To help avoid  this: Eat a balanced diet, maintaining a consistent amount of Vitamin K. Notify your provider about major diet changes you plan to make. Avoid alcohol or limit your intake to 1 drink for women and 2 drinks for men per day. (1 drink is 5 oz. wine, 12 oz. beer, or 1.5 oz. liquor.)  Make sure that ANY health care provider who prescribes medication for you knows that you are taking Coumadin (warfarin).  Also make sure the healthcare provider who is monitoring your Coumadin knows when you have started a new medication including herbals and non-prescription products.  Coumadin (Warfarin)  Major Drug Interactions  Increased Warfarin Effect Decreased Warfarin Effect  Alcohol (large quantities) Antibiotics (esp. Septra/Bactrim, Flagyl, Cipro) Amiodarone (Cordarone) Aspirin (ASA) Cimetidine (Tagamet) Megestrol (Megace) NSAIDs (ibuprofen, naproxen, etc.) Piroxicam (Feldene) Propafenone (Rythmol SR) Propranolol (Inderal) Isoniazid (INH) Posaconazole (Noxafil) Barbiturates (Phenobarbital) Carbamazepine (Tegretol) Chlordiazepoxide (Librium) Cholestyramine (Questran) Griseofulvin Oral Contraceptives Rifampin Sucralfate (Carafate) Vitamin K   Coumadin (Warfarin) Major Herbal Interactions  Increased Warfarin Effect Decreased Warfarin Effect  Garlic Ginseng Ginkgo biloba Coenzyme Q10 Green tea St. John's wort    Coumadin (Warfarin) FOOD Interactions  Eat a consistent number of servings per week of foods HIGH in Vitamin K (1 serving =  cup)  Collards (cooked, or boiled & drained) Kale (cooked, or boiled & drained) Mustard greens (cooked, or boiled & drained) Parsley *serving size only =  cup Spinach (cooked, or boiled & drained) Swiss chard (cooked, or boiled & drained) Turnip greens (cooked, or boiled & drained)  Eat a consistent number of servings per week of foods MEDIUM-HIGH in Vitamin K (1 serving = 1 cup)  Asparagus (cooked, or boiled & drained) Broccoli (cooked, boiled &  drained, or raw & chopped) Brussel sprouts (cooked, or boiled & drained) *serving size only =  cup Lettuce, raw (green leaf, endive, romaine) Spinach, raw Turnip greens, raw & chopped   These websites have more information on Coumadin (warfarin):  http://www.king-russell.com/; https://www.hines.net/;

## 2023-05-26 NOTE — Progress Notes (Signed)
Physical Therapy Session Note  Patient Details  Name: [name] MRN: [id number] Date of Birth: 09-Apr-1956  Today's Date: 05/26/2023 PT Individual Time: [phone] PT Individual Time Calculation (min): 69 min   Short Term Goals: Week 1:  PT Short Term Goal 1 (Week 1): = to LTGs based on ELOS  Skilled Therapeutic Interventions/Progress Updates:    Pt received supine in bed reporting he is significantly fatigued from therapy sessions and nursing care for BM, but despite this is agreeable to participate in therapy session within his tolerance.  Supine>sitting L EOB, HOB flat and not using bedrails, with min assist for trunk upright and stability. Pt does well maintaining sternal precautions during this.  Pt reports that he has requested his family to bring in sneakers.   Sit<>stands, no AD, with CGA/min assist throughout session for lifting/balance - pt with good recall to maintain sternal precautions during.  Gait training ~158ft, no UE support, with min assist for balance to main therapy gym. Pt demonstrating the following gait deviations with therapist providing the described cuing and facilitation for improvement:  - continues to demo R LE circumduction compensation due to keeping R LE in full knee extension during swing  - balance instability throughout - prolonged L stance time with increased time to advance R LE during swing - decreased gait speed  R LE NMR training in // bars including:  - standing hamstring curl attempts - pt only able to perform knee flexion in combination with hip flexion, unable to isolate knee flexion AROM x10reps  - forward/backwards stepping over 1, 6" hurdle with BUE support progessed to only R UE support x10 reps leading with L LE focusing on R LE swing phase mechanics to initiate hip and knee flexion - min assist for balance - forward/backwards stepping over 2x 6" hurdles leading with L LE to target initiating R hip/knee flexion at toe-off and then  maintaining flexion throughout swing to clear 2nd hurdle and then backwards stepping over hurdles leading with L LE followed by having to clear both hurdles with R LE 2x 8-10 reps   Pt reports these exercises as significantly fatiguing and requires therapeutic seated rest breaks between each set.  During rest breaks, discussed home environment and family support with pt confiding in therapist regarding the loss of his daughter 2 years ago and the emotional impact this has had on him. Provided therapeutic listening and emotional support.  Gait training ~111ft back to his room, no UE support, with cuing for carryover of increased R hip/knee flexion during swing phase with pt increasing in consistency but still requires prolonged L stance phase to allow improved R LE gait mechanics.  Sit>supine with CGA for steadying during trunk descent. Pt left supine in bed with needs in reach and bed alarm on.    Therapy Documentation Precautions:  Precautions Precautions: Fall, Sternal Restrictions Weight Bearing Restrictions: No (only sternal precautions)   Pain:  No reports of pain throughout session.     Therapy/Group: Individual Therapy  Ginny Forth , PT, DPT, NCS, CSRS 05/26/2023, 3:56 PM

## 2023-05-26 NOTE — Plan of Care (Signed)
  Problem: RH Balance Goal: LTG Patient will maintain dynamic standing with ADLs (OT) Description: LTG:  Patient will maintain dynamic standing balance with assist during activities of daily living (OT)  Flowsheets (Taken 05/26/2023 1837) LTG: Pt will maintain dynamic standing balance during ADLs with: Supervision/Verbal cueing   Problem: RH Bathing Goal: LTG Patient will bathe all body parts with assist levels (OT) Description: LTG: Patient will bathe all body parts with assist levels (OT) Flowsheets (Taken 05/26/2023 1837) LTG: Pt will perform bathing with assistance level/cueing: Supervision/Verbal cueing   Problem: RH Dressing Goal: LTG Patient will perform upper body dressing (OT) Description: LTG Patient will perform upper body dressing with assist, with/without cues (OT). Flowsheets (Taken 05/26/2023 1837) LTG: Pt will perform upper body dressing with assistance level of: Supervision/Verbal cueing Goal: LTG Patient will perform lower body dressing w/assist (OT) Description: LTG: Patient will perform lower body dressing with assist, with/without cues in positioning using equipment (OT) Flowsheets (Taken 05/26/2023 1837) LTG: Pt will perform lower body dressing with assistance level of: Supervision/Verbal cueing   Problem: RH Toileting Goal: LTG Patient will perform toileting task (3/3 steps) with assistance level (OT) Description: LTG: Patient will perform toileting task (3/3 steps) with assistance level (OT)  Flowsheets (Taken 05/26/2023 1837) LTG: Pt will perform toileting task (3/3 steps) with assistance level: Supervision/Verbal cueing   Problem: RH Functional Use of Upper Extremity Goal: LTG Patient will use RT/LT upper extremity as a (OT) Description: LTG: Patient will use right/left upper extremity as a stabilizer/gross assist/diminished/nondominant/dominant level with assist, with/without cues during functional activity (OT) Flowsheets (Taken 05/26/2023 1837) LTG: Use of upper  extremity in functional activities: RUE as nondominant level   Problem: RH Simple Meal Prep Goal: LTG Patient will perform simple meal prep w/assist (OT) Description: LTG: Patient will perform simple meal prep with assistance, with/without cues (OT). Flowsheets (Taken 05/26/2023 1837) LTG: Pt will perform simple meal prep with assistance level of: Supervision/Verbal cueing   Problem: RH Toilet Transfers Goal: LTG Patient will perform toilet transfers w/assist (OT) Description: LTG: Patient will perform toilet transfers with assist, with/without cues using equipment (OT) Flowsheets (Taken 05/26/2023 1837) LTG: Pt will perform toilet transfers with assistance level of: Supervision/Verbal cueing   Problem: RH Tub/Shower Transfers Goal: LTG Patient will perform tub/shower transfers w/assist (OT) Description: LTG: Patient will perform tub/shower transfers with assist, with/without cues using equipment (OT) Flowsheets (Taken 05/26/2023 1837) LTG: Pt will perform tub/shower stall transfers with assistance level of: Supervision/Verbal cueing

## 2023-05-26 NOTE — Discharge Summary (Signed)
Physician Discharge Summary  Patient ID: [name] MRN: [id number] DOB/AGE: June 03, 1956 67 y.o.  Admit date: 05/25/2023 Discharge date: 06/04/2023  Discharge Diagnoses:  Principal Problem:   CVA (cerebral vascular accident) [facility]) Active problems: AS s/p AVR Aortic aneurysm s/p repair Functional deficits secondary to CVA Hypertension Hyperlipidemia CAD GERD BPH  Discharged Condition: stable  Significant Diagnostic Studies:  Narrative & Impression  CLINICAL DATA:  Orthopnea.   EXAM: CHEST - 2 VIEW   COMPARISON:  05/20/2023.   FINDINGS: Trachea is midline. Heart size stable. Lungs are clear. Trace left pleural fluid.   IMPRESSION: Trace left pleural effusion.     Electronically Signed   By: Leanna Battles M.D.   On: 06/03/2023 16:24     Labs:  Basic Metabolic Panel: Recent Labs  Lab 06/01/23 0701  NA 135  K 4.0  CL 104  CO2 21*  GLUCOSE 106*  BUN 24*  CREATININE 1.04  CALCIUM 8.7*    CBC: Recent Labs  Lab 05/29/23 0605 06/01/23 0701  WBC 9.2 9.5  HGB 9.2* 9.7*  HCT 29.2* 30.7*  MCV 95.7 94.2  PLT 278 355    CBG: No results for input(s): "GLUCAP" in the last 168 hours.   Brief HPI:   [name] is a 67 y.o. male who presented to the [facility] for AVR and ascending aortic aneurysm repair on 05/18/2023. He had evidence of bicuspid aortic valve stenosis with 5.1 cm ascending aortic aneurysm and underwent repair by Dr. Laneta Simmers on 05/18/2023. He was extubated later that day. He had brief episode of hematuria after difficult Foley placement. Remains on Proscar for urinary retention. Intraoperative TEE with EF approximately 50%. Pacer wires and chest tubes removed on 7/03. Chest x-ray was stable and he was transferred out to stepdown unit. Acute blood loss anemia treated with oral iron replacement. Aspirin dosing decreased to 81 mg due to post-operative thrombocytopenia. Later in the evening on 7/03 he was noted to have right hand weakness and rapid  response was called. He was noted to have atrial fibrillation with RVR and was hypotensive. He was started on an amiodarone infusion and cardiology consulted. He was transition to amiodarone 200 mg twice daily on 7/04. CT of head without evidence of acute infarct or hemorrhage, however brain MRI showed scattered small acute infarcts in the left cerebellar hemisphere in both cerebral hemispheres likely embolic in etiology. Neurology was consulted. He was felt to be a candidate for Coumadin due to the recent new aortic valve as well as postoperative A-fib. No large vessel occlusion noted. INR goal is 2.0. BLE venous duplex negative for DVT on 7/06. Hemoglobin is stable and platelet count is now normal. His incision is healing without signs of infection and he is tolerating his diet.    Hospital Course: [name] was admitted to rehab 05/25/2023 for inpatient therapies to consist of PT, ST and OT at least three hours five days a week. Past admission physiatrist, therapy team and rehab RN have worked together to provide customized collaborative inpatient rehab. Follow-up labs on 7/09 stable>>BUN remains elevated, now 29. Seen in follow-up by Dr. Laneta Simmers on 7/11. INR therapeutic at 2.5. Has long hx of low back pain followed by ortho. Having intermittent radicular-type symptoms in the RLE. Also has had cervical symptoms as well. Discussed no elective surgeries recommended for 6 mo post CVA.  H and H improving on 7/12.  Sleep improved with melatonin 5mg  at bedtime.  Complained of difficulty breathing and mild DOE  on 7/17 and chest x-ray updated and without acute changes.  Blood pressures were monitored on TID basis and Lopressor 12.5 mg continued.  Rehab course: During patient's stay in rehab weekly team conferences were held to monitor patient's progress, set goals and discuss barriers to discharge. At admission, patient required  min assist with mobility and min-mod with basic self-care skills and IADL.  He  has had improvement in activity tolerance, balance, postural control as well as ability to compensate for deficits. He has had improvement in functional use RUE/LUE  and RLE/LLE as well as improvement in awareness. Patient has met 9 of 9 long term goals due to improved activity tolerance, improved balance, postural control, ability to compensate for deficits, functional use of  RIGHT upper and RIGHT lower extremity, improved attention, improved awareness, and improved coordination.  Patient to discharge at overall Supervision level.  Patient's care partner is independent to provide the necessary physical and cognitive assistance at discharge.  Outpatient OT   Discharge disposition: 01-Home or Self Care     Diet: heart healthy  Special Instructions: No driving, alcohol consumption or tobacco use.  Continue sternal precautions, Coumadin, follow-up with Dr. Laneta Simmers, TTE in 6 weeks, SBE prophylaxis.   Arrangements made for Coumadin clinic for Monday, 7/22 in [place] at 2 pm.   No elective surgeries recommended for 6 mo post CVA.  30-35 minutes were spent on discharge planning and discharge summary. Discharge Instructions     Ambulatory referral to Neurology   Complete by: As directed    An appointment is requested in approximately: 4 weeks   Ambulatory referral to Physical Medicine Rehab   Complete by: As directed    Hospital follow-up   Discharge patient   Complete by: As directed    Discharge disposition: 01-Home or Self Care   Discharge patient date: 06/04/2023      Allergies as of 06/04/2023       Reactions   Alcohol Other (See Comments)   Pts skin gets red. Ex. Pt used alcohol based deodorant and got red things under arms. & Drinks alcohol notices "from heart on up" everything turns red.   Chlorthalidone    Dizziness and Syncopal Episode        Medication List     STOP taking these medications    albuterol 108 (90 Base) MCG/ACT inhaler Commonly known as: VENTOLIN HFA    amLODipine 5 MG tablet Commonly known as: NORVASC   losartan 100 MG tablet Commonly known as: COZAAR   metoprolol succinate 25 MG 24 hr tablet Commonly known as: TOPROL-XL   nutrition supplement (JUVEN) Pack   oxyCODONE 5 MG immediate release tablet Commonly known as: Oxy IR/ROXICODONE       TAKE these medications    acetaminophen 325 MG tablet Commonly known as: TYLENOL Take 1-2 tablets (325-650 mg total) by mouth every 4 (four) hours as needed for mild pain.   amiodarone 200 MG tablet Commonly known as: PACERONE Take 1 tablet (200 mg total) by mouth daily. What changed:  medication strength how much to take how to take this when to take this additional instructions   aspirin EC 81 MG tablet Take 1 tablet (81 mg total) by mouth daily.   atorvastatin 80 MG tablet Commonly known as: LIPITOR Take 1 tablet (80 mg total) by mouth daily.   baclofen 10 MG tablet Commonly known as: LIORESAL Take 1 tablet (10 mg total) by mouth daily.   bisacodyl 5 MG EC tablet Commonly known as:  DULCOLAX Take 1 tablet (5 mg total) by mouth daily at 2 PM.   finasteride 5 MG tablet Commonly known as: PROSCAR Take 1 tablet (5 mg total) by mouth daily.   metoprolol tartrate 25 MG tablet Commonly known as: LOPRESSOR Take 0.5 tablets (12.5 mg total) by mouth 2 (two) times daily.   Trigels-F Forte Caps capsule Generic drug: Fe Fum-Vit C-Vit B12-FA Take 1 capsule by mouth daily after breakfast.   warfarin 5 MG tablet Commonly known as: COUMADIN Take 0.5 tablets (2.5 mg total) by mouth daily at 4 PM.        Follow-up Information     Practice, [facility].   Why: Call the office in 1-2 days to make arrangements for hospital follow-up appointment. Contact information: [address] [place] [address] [phone]         Alleen Borne, MD. Go to.   Specialty: Cardiothoracic Surgery Contact information: [address] [place]  [address] [phone]         Erick Colace, MD Follow up.   Specialty: Physical Medicine and Rehabilitation Why: office will call you to arrange your appt (sent) Contact information: [address] [place] [address] [phone]         Jonelle Sidle, MD Follow up.   Specialty: Cardiology Why: Call the office in 1-2 days to make arrangements for hosptial follow-up appointment. Contact information: [address] [place] [address] [phone]         Oretha Milch, MD Follow up.   Specialty: Pulmonary Disease Why: Call the office in 1-2 days to make arrangements for hosptial follow-up appointment. Contact information: [address] [place] [address] [phone]         Asa Lente, MD Follow up.   Specialty: Neurology Why: Call the office in 1-2 days to make arrangements for hosptial follow-up appointment. Contact information: [address] [place] [address] [phone]                 Signed: Milinda Antis 06/04/2023, 9:11 AM

## 2023-05-26 NOTE — Evaluation (Signed)
Occupational Therapy Assessment and Plan  Patient Details  Name: [name] MRN: [id number] Date of Birth: June 05, 1956  OT Diagnosis: acute pain, hemiplegia affecting dominant side, and muscle weakness (generalized) Rehab Potential: Rehab Potential (ACUTE ONLY): Good ELOS: 7-10 days   Today's Date: 05/26/2023 OT Individual Time: 1100-1200 OT Individual Time Calculation (min): 60 min     Hospital Problem: Principal Problem:   CVA (cerebral vascular accident) [facility])   Past Medical History:  Past Medical History:  Diagnosis Date   Arthritis    Coronary atherosclerosis of native coronary artery    a. NSTEMI (02/2014):  LHC (02/2014):  Mild disease in LAD and CFX; prox RCA occluded with R-R collats, dist RCA filled by L-R collats, inf HK, EF 55%, LVEDP 15 mmHg.  PCI:  Unsuccessful angioplasty of RCA (late presentation of inf MI) - tx medically.   Essential hypertension    GERD (gastroesophageal reflux disease)    Hyperlipidemia    NSTEMI (non-ST elevated myocardial infarction) (HCC) 03/11/14   Renal cell carcinoma of right kidney (HCC)    Partial nephrectomy in 2013   Past Surgical History:  Past Surgical History:  Procedure Laterality Date   AORTIC VALVE REPLACEMENT N/A 05/18/2023   Procedure: AORTIC VALVE REPLACEMENT (AVR) USING INSPIRIS RESILIA 23 MM AORTIC VALVE;  Surgeon: Alleen Borne, MD;  [facility];  Service: Open Heart Surgery;  Laterality: N/A;   BACK SURGERY     1989   CERVICAL DISC SURGERY  04/29/2021   COLONOSCOPY N/A 12/11/2020   Procedure: COLONOSCOPY;  Surgeon: Franky Macho, MD;  [facility];  Service: Gastroenterology;  Laterality: N/A;   LEFT HEART CATHETERIZATION WITH CORONARY ANGIOGRAM N/A 03/17/2014   Procedure: LEFT HEART CATHETERIZATION WITH CORONARY ANGIOGRAM;  Surgeon: Corky Crafts, MD;  Location: [facility] CATH LAB;  Service: Cardiovascular;  Laterality: N/A;   POLYPECTOMY  12/11/2020   Procedure: POLYPECTOMY;  Surgeon: Franky Macho,  MD;  [facility];  Service: Gastroenterology;;   REPLACEMENT ASCENDING AORTA N/A 05/18/2023   Procedure: REPLACEMENT OF ASCENDING AORTIC ANEURYSM USING A 30 X 10 MM HEMASHIELD PLATINUM GRAFT;  Surgeon: Alleen Borne, MD;  [facility];  Service: Open Heart Surgery;  Laterality: N/A;  CIRC ARREST   RIGHT HEART CATH AND CORONARY ANGIOGRAPHY N/A 04/03/2023   Procedure: RIGHT HEART CATH AND CORONARY ANGIOGRAPHY;  Surgeon: Tonny Bollman, MD;  Location: [facility] INVASIVE CV LAB;  Service: Cardiovascular;  Laterality: N/A;   ROBOT ASSISTED LAPAROSCOPIC NEPHRECTOMY  01/14/2012   Procedure: ROBOTIC ASSISTED LAPAROSCOPIC NEPHRECTOMY;  Surgeon: Milford Cage, MD;  Location: [facility] ORS;  Service: Urology;  Laterality: Right;  Robot Laparoscopic Right Partial Nephrectomy     TEE WITHOUT CARDIOVERSION N/A 05/18/2023   Procedure: TRANSESOPHAGEAL ECHOCARDIOGRAM;  Surgeon: Alleen Borne, MD;  Location: [facility] OR;  Service: Open Heart Surgery;  Laterality: N/A;   TONSILLECTOMY      Assessment & Plan Clinical Impression: [name] is a 67 year old male who presented to the [facility] for AVR and ascending aortic aneurysm repair on 05/18/2023. He had evidence of bicuspid aortic valve stenosis with 5.1 cm ascending aortic aneurysm and underwent repair by Dr. Laneta Simmers on 05/18/2023. He was extubated later that day.  He had brief episode of hematuria after difficult Foley placement.  Remains on Proscar for urinary retention.  Intraoperative TEE with EF approximately 50%.  Pacer wires and chest tubes removed on 7/03.  Chest x-ray was stable and he was transferred out to stepdown unit.  Acute blood loss anemia treated with oral iron replacement.  Aspirin dosing decreased to 81 mg due to post-operative thrombocytopenia.  Later in the evening on 7/03 he was noted to have right hand weakness and rapid response was called.  He was noted to have atrial fibrillation with RVR and was hypotensive.  He was started on an amiodarone  infusion and cardiology consulted.  He was transition to amiodarone 200 mg twice daily on 7/04.  CT of head without evidence of acute infarct or hemorrhage, however brain MRI showed scattered small acute infarcts in the left cerebellar hemisphere in both cerebral hemispheres likely embolic in etiology.  Neurology was consulted.  He was felt to be a candidate for Coumadin due to the recent new aortic valve as well as postoperative A-fib.  No large vessel occlusion noted.  INR goal is 2.0.  BLE venous duplex negative for DVT on 7/06. Hemoglobin is stable and platelet count is now normal. His incision is healing without signs of infection and he is tolerating his diet. The patient requires inpatient medicine and rehabilitation evaluations and services for ongoing dysfunction secondary to acute CVA, Severe AS, aorotic aneurysm s/p repair.   Past medical history includes for lipidemia, coronary artery disease status post NSTEMI in 2015 with catheterization showing an occluded proximal RCA with right to right collaterals filling the distal vessel that cannot be opened, renal cell carcinoma status post partial right nephrectomy in 2013, severe degenerative spine disease.  Previous lower back surgery in the 1980s and a cervical disc surgery in 2022.Patient transferred to [name] on 05/25/2023 .    Patient currently requires min-mod with basic self-care skills and IADL secondary to muscle weakness, decreased cardiorespiratoy endurance, unbalanced muscle activation and decreased coordination, central origin, and decreased sitting balance, decreased standing balance, hemiplegia, decreased balance strategies, and difficulty maintaining precautions.  Prior to hospitalization, patient could complete BADLs and IADLs with modified independent .  Patient will benefit from skilled intervention to decrease level of assist with basic self-care skills, increase independence with basic self-care skills, and increase level of independence  with iADL prior to discharge home with care partner.  Anticipate patient will require intermittent supervision and follow up outpatient.  OT - End of Session Activity Tolerance: Decreased this session Endurance Deficit: Yes OT Assessment Rehab Potential (ACUTE ONLY): Good OT Basic ADL's Functional Problem(s): Bathing;Dressing;Toileting OT Advanced ADL's Functional Problem(s): Simple Meal Preparation OT Transfers Functional Problem(s): Toilet;Tub/Shower OT Additional Impairment(s): Fuctional Use of Upper Extremity OT Plan OT Intensity: Minimum of 1-2 x/day, 45 to 90 minutes OT Frequency: 5 out of 7 days OT Duration/Estimated Length of Stay: 7-10 days OT Treatment/Interventions: Balance/vestibular training;Discharge planning;Functional electrical stimulation;Pain management;Self Care/advanced ADL retraining;Therapeutic Activities;UE/LE Coordination activities;Disease mangement/prevention;Functional mobility training;Patient/family education;Skin care/wound managment;Therapeutic Exercise;DME/adaptive equipment instruction;Neuromuscular re-education;Psychosocial support;Splinting/orthotics;UE/LE Strength taining/ROM;Community reintegration OT Self Feeding Anticipated Outcome(s): Supervision OT Basic Self-Care Anticipated Outcome(s): Supervision OT Toileting Anticipated Outcome(s): Supervision OT Bathroom Transfers Anticipated Outcome(s): Supervision OT Recommendation Patient destination: Home Follow Up Recommendations: Outpatient OT Equipment Recommended: To be determined   OT Evaluation Precautions/Restrictions  Precautions Precautions: Fall;Sternal Precaution Comments: sternal precautions Restrictions Weight Bearing Restrictions: No RUE Weight Bearing: Non weight bearing LUE Weight Bearing: Non weight bearing Other Position/Activity Restrictions: sternal precautions General Chart Reviewed: Yes Additional Pertinent History: CAD, previous NSTEMI Family/Caregiver Present: No Vital  Signs   Pain   Home Living/Prior Functioning Home Living Family/patient expects to be discharged to:: Private residence Living Arrangements: Spouse/significant other, Children Available Help at Discharge: Available 24 hours/day, Family Type  of Home: House Home Access: Stairs to enter [employer] of Steps: 2 Entrance Stairs-Rails: Right, Left Home Layout: One level Bathroom Shower/Tub: Health visitor: Standard Bathroom Accessibility: Yes Additional Comments: reports "on a bad day" he would use a SPC; reports ~35yrs ago he had gradual onset of B LE weakness and numbness/tingling with some gait deviations in R LE due to this (pt reports no known cause)  Lives With: Son (son and son's significant other) IADL History Homemaking Responsibilities: Yes Meal Prep Responsibility: Primary Laundry Responsibility: Primary Cleaning Responsibility: Primary Bill Paying/Finance Responsibility: Primary Shopping Responsibility: Primary Child Care Responsibility: No Current License: Yes Mode of Transportation: Engineer, civil (consulting): Retired Company secretary; Fish farm manager Occupation: Retired Prior Function Level of Independence: Independent with gait, Requires assistive device for independence, Independent with transfers, Independent with homemaking with ambulation  Able to Take Stairs?: Yes Driving: Yes Vocation: Retired Administrator, sports Baseline Vision/History: 1 Wears glasses (at all times, bifocals) Ability to See in Adequate Light: 0 Adequate Patient Visual Report: No change from baseline Vision Assessment?: No apparent visual deficits Perception  Perception: Within Functional Limits Praxis Praxis: Intact Cognition Cognition Overall Cognitive Status: Within Functional Limits for tasks assessed Arousal/Alertness: Awake/alert Orientation Level: Person;Place;Situation Person: Oriented Place: Oriented Memory: Impaired Memory Impairment: Decreased recall of new  information Attention: Focused;Sustained Focused Attention: Appears intact Sustained Attention: Appears intact Awareness: Appears intact Problem Solving: Appears intact Behaviors:  (mildly left limiting) Safety/Judgment: Appears intact Brief Interview for Mental Status (BIMS) Repetition of Three Words (First Attempt): 3 Temporal Orientation: Year: Correct Temporal Orientation: Month: Accurate within 5 days Temporal Orientation: Day: Correct Recall: "Sock": Yes, no cue required Recall: "Blue": Yes, no cue required Recall: "Bed": Yes, no cue required BIMS Summary Score: 15 Sensation Sensation Light Touch: Impaired Detail Peripheral sensation comments: able to sense light touch but reports onset of numbness in B LEs approximately 2 yrs ago Light Touch Impaired Details: Impaired RLE;Impaired LLE Hot/Cold: Appears Intact Proprioception: Not tested Stereognosis: Not tested Coordination Gross Motor Movements are Fluid and Coordinated: No Fine Motor Movements are Fluid and Coordinated: No Coordination and Movement Description: decreased coordination during functional mobility Finger Nose Finger Test: Slowed motor movements with LUE with mild undershooting present Motor  Motor Motor: Hemiplegia;Abnormal tone Motor - Skilled Clinical Observations: R hemiparesis on top of baseline R LE impairments  Trunk/Postural Assessment  Cervical Assessment Cervical Assessment: Within Functional Limits Thoracic Assessment Thoracic Assessment: Within Functional Limits Lumbar Assessment Lumbar Assessment: Within Functional Limits Postural Control Postural Control: Within Functional Limits  Balance Balance Balance Assessed: Yes Static Sitting Balance Static Sitting - Balance Support: Feet supported Static Sitting - Level of Assistance: 5: Stand by assistance Dynamic Sitting Balance Dynamic Sitting - Balance Support: Feet supported Dynamic Sitting - Level of Assistance: 5: Stand by  assistance Static Standing Balance Static Standing - Balance Support: During functional activity;Bilateral upper extremity supported Static Standing - Level of Assistance: 4: Min assist;5: Stand by assistance (CGA) Dynamic Standing Balance Dynamic Standing - Balance Support: During functional activity;Bilateral upper extremity supported Dynamic Standing - Level of Assistance: 4: Min assist Extremity/Trunk Assessment RUE Assessment RUE Assessment: Exceptions to [facility] General Strength Comments: grip 3+/5, wirst/bicep/tricep 3+/5, 4-/5 proximally LUE Assessment LUE Assessment: Within Functional Limits  Care Tool Care Tool Self Care Eating   Eating Assist Level: Set up assist    Oral Care    Oral Care Assist Level: Set up assist    Bathing   Body parts bathed by patient: Right arm;Left arm;Chest;Abdomen;Front perineal area;Right upper  leg;Left upper leg;Face Body parts bathed by helper: Buttocks;Right lower leg;Left lower leg   Assist Level: Minimal Assistance - Patient > 75%    Upper Body Dressing(including orthotics)   What is the patient wearing?: Pull over shirt   Assist Level: Minimal Assistance - Patient > 75%    Lower Body Dressing (excluding footwear)   What is the patient wearing?: Pants Assist for lower body dressing: Moderate Assistance - Patient 50 - 74%    Putting on/Taking off footwear   What is the patient wearing?: Shoes;Socks Assist for footwear: Maximal Assistance - Patient 25 - 49%       Care Tool Toileting Toileting activity   Assist for toileting: Moderate Assistance - Patient 50 - 74%     Care Tool Bed Mobility Roll left and right activity   Roll left and right assist level: Minimal Assistance - Patient > 75%    Sit to lying activity   Sit to lying assist level: Minimal Assistance - Patient > 75%    Lying to sitting on side of bed activity   Lying to sitting on side of bed assist level: the ability to move from lying on the back to sitting on the  side of the bed with no back support.: Contact Guard/Touching assist     Care Tool Transfers Sit to stand transfer   Sit to stand assist level: Minimal Assistance - Patient > 75%    Chair/bed transfer   Chair/bed transfer assist level: Minimal Assistance - Patient > 75%     Toilet transfer   Assist Level: Minimal Assistance - Patient > 75% Assistive Device Comment: RW   Care Tool Cognition  Expression of Ideas and Wants Expression of Ideas and Wants: 4. Without difficulty (complex and basic) - expresses complex messages without difficulty and with speech that is clear and easy to understand  Understanding Verbal and Non-Verbal Content Understanding Verbal and Non-Verbal Content: 4. Understands (complex and basic) - clear comprehension without cues or repetitions   Memory/Recall Ability Memory/Recall Ability : Current season;That he or she is in a hospital/hospital unit   Refer to Care Plan for Long Term Goals  SHORT TERM GOAL WEEK 1 OT Short Term Goal 1 (Week 1): STG=LTG d/t ELOS  Recommendations for other services: None    Skilled Therapeutic Intervention Skilled Therapeutic Interventions/Progress Updates:  1:1 skilled OT evaluation and intervention initiated with education provided on OT role, goals, and POC. Pt received sitting up in bed presenting to be in good spirits receptive to skilled OT session reporting 0/10 pain- OT offering intermittent rest breaks, repositioning, and therapeutic support to optimize participation in therapy session. Pt completed BADLs at levels listed below this session with skilled education provided on modifications for completing BADLs while maintaining sternal precautions, functional transfer training, functional mobility training, and d/c planning. Pt would benefit from continued OT services in Ipr setting. Pt was left sitting on toilet with nursing staff present in room with call bell in reach, and all needs met.   ADL ADL Eating: Set up Where  Assessed-Eating: Chair Grooming: Supervision/safety Where Assessed-Grooming: Sitting at sink Upper Body Bathing: Minimal assistance Where Assessed-Upper Body Bathing: Shower Lower Body Bathing: Moderate assistance Where Assessed-Lower Body Bathing: Shower Upper Body Dressing: Minimal assistance Where Assessed-Upper Body Dressing: Edge of bed Lower Body Dressing: Moderate assistance Where Assessed-Lower Body Dressing: Edge of bed Toileting: Moderate assistance Where Assessed-Toileting: Teacher, adult education: Curator Method: Ambulating;Stand Wellsite geologist: Engineer, technical sales: Not  assessed Film/video editor: Minimal assistance Film/video editor Method: Ambulating;Stand pivot Astronomer: Grab bars Mobility  Bed Mobility Bed Mobility: Supine to Sit;Sit to Supine Supine to Sit: Minimal Assistance - Patient > 75% Sit to Supine: Contact Guard/Touching assist Transfers Sit to Stand: Minimal Assistance - Patient > 75% Stand to Sit: Minimal Assistance - Patient > 75%   Discharge Criteria: Patient will be discharged from OT if patient refuses treatment 3 consecutive times without medical reason, if treatment goals not met, if there is a change in medical status, if patient makes no progress towards goals or if patient is discharged from hospital.  The above assessment, treatment plan, treatment alternatives and goals were discussed and mutually agreed upon: by patient  Army Fossa 05/26/2023, 6:21 PM

## 2023-05-26 NOTE — Progress Notes (Signed)
Inpatient Rehabilitation Care Coordinator Assessment and Plan Patient Details  Name: [name] MRN: [id number] Date of Birth: 11-Feb-1956  Today's Date: 05/26/2023  Hospital Problems: Principal Problem:   CVA (cerebral vascular accident) [facility])  Past Medical History:  Past Medical History:  Diagnosis Date   Arthritis    Coronary atherosclerosis of native coronary artery    a. NSTEMI (02/2014):  LHC (02/2014):  Mild disease in LAD and CFX; prox RCA occluded with R-R collats, dist RCA filled by L-R collats, inf HK, EF 55%, LVEDP 15 mmHg.  PCI:  Unsuccessful angioplasty of RCA (late presentation of inf MI) - tx medically.   Essential hypertension    GERD (gastroesophageal reflux disease)    Hyperlipidemia    NSTEMI (non-ST elevated myocardial infarction) (HCC) 03/11/14   Renal cell carcinoma of right kidney (HCC)    Partial nephrectomy in 2013   Past Surgical History:  Past Surgical History:  Procedure Laterality Date   AORTIC VALVE REPLACEMENT N/A 05/18/2023   Procedure: AORTIC VALVE REPLACEMENT (AVR) USING INSPIRIS RESILIA 23 MM AORTIC VALVE;  Surgeon: Alleen Borne, MD;  [facility] OR;  Service: Open Heart Surgery;  Laterality: N/A;   BACK SURGERY     1989   CERVICAL DISC SURGERY  04/29/2021   COLONOSCOPY N/A 12/11/2020   Procedure: COLONOSCOPY;  Surgeon: Franky Macho, MD;  [facility];  Service: Gastroenterology;  Laterality: N/A;   LEFT HEART CATHETERIZATION WITH CORONARY ANGIOGRAM N/A 03/17/2014   Procedure: LEFT HEART CATHETERIZATION WITH CORONARY ANGIOGRAM;  Surgeon: Corky Crafts, MD;  Location: [facility] CATH LAB;  Service: Cardiovascular;  Laterality: N/A;   POLYPECTOMY  12/11/2020   Procedure: POLYPECTOMY;  Surgeon: Franky Macho, MD;  [facility];  Service: Gastroenterology;;   REPLACEMENT ASCENDING AORTA N/A 05/18/2023   Procedure: REPLACEMENT OF ASCENDING AORTIC ANEURYSM USING A 30 X 10 MM HEMASHIELD PLATINUM GRAFT;  Surgeon: Alleen Borne,  MD;  [facility] OR;  Service: Open Heart Surgery;  Laterality: N/A;  CIRC ARREST   RIGHT HEART CATH AND CORONARY ANGIOGRAPHY N/A 04/03/2023   Procedure: RIGHT HEART CATH AND CORONARY ANGIOGRAPHY;  Surgeon: Tonny Bollman, MD;  Location: [facility] INVASIVE CV LAB;  Service: Cardiovascular;  Laterality: N/A;   ROBOT ASSISTED LAPAROSCOPIC NEPHRECTOMY  01/14/2012   Procedure: ROBOTIC ASSISTED LAPAROSCOPIC NEPHRECTOMY;  Surgeon: Milford Cage, MD;  Location: [facility] ORS;  Service: Urology;  Laterality: Right;  Robot Laparoscopic Right Partial Nephrectomy     TEE WITHOUT CARDIOVERSION N/A 05/18/2023   Procedure: TRANSESOPHAGEAL ECHOCARDIOGRAM;  Surgeon: Alleen Borne, MD;  Location: [facility] OR;  Service: Open Heart Surgery;  Laterality: N/A;   TONSILLECTOMY     Social History:  reports that he quit smoking about 43 years ago. His smoking use included cigarettes. He has never used smokeless tobacco. He reports current drug use. Frequency: 7.00 times per week. Drug: Marijuana. He reports that he does not drink alcohol.  Family / Support Systems Marital Status: Married Patient Roles: Spouse Spouse/Significant Other: Chief Operating Officer Children: yes Other Supports: sister, out of state Anticipated Caregiver: Yerick Eggebrecht Ability/Limitations of Caregiver: Supervision Caregiver Availability: 24/7 Family Dynamics: support from spouse and family  Social History Preferred language: English Religion:  Cultural Background: Active, independent Education: college Health Literacy - How often do you need to have someone help you when you read instructions, pamphlets, or other written material from your doctor or pharmacy?: Never Writes: Yes Legal History/Current Legal Issues: n/a Guardian/Conservator: n/a   Abuse/Neglect Abuse/Neglect Assessment Can Be  Completed: Yes Physical Abuse: Denies Verbal Abuse: Denies Sexual Abuse: Denies Exploitation of patient/patient's resources: Denies Self-Neglect: Denies  Patient  response to: Social Isolation - How often do you feel lonely or isolated from those around you?: Never  Emotional Status Pt's affect, behavior and adjustment status: Pleasant! In great spirits, speaking with sister at bedside Recent Psychosocial Issues: Coping Psychiatric History: n/a Substance Abuse History: n/a  Patient / Family Perceptions, Expectations & Goals Pt/Family understanding of illness & functional limitations: Independent Premorbid pt/family roles/activities: Independent overall Anticipated changes in roles/activities/participation: Anticipating MOD I goals Pt/family expectations/goals: MOD I  Building surveyor: None Premorbid Home Care/DME Agencies: Other (Comment) Adult nurse (2 wheels), [employer] - single point, Wheelchair - manual, BSC/3in1) Transportation available at discharge: Yes Is the patient able to respond to transportation needs?: Yes In the past 12 months, has lack of transportation kept you from medical appointments or from getting medications?: No In the past 12 months, has lack of transportation kept you from meetings, work, or from getting things needed for daily living?: No Resource referrals recommended: Neuropsychology  Discharge Planning Living Arrangements: Spouse/significant other, Children Support Systems: Spouse/significant other, Children Type of Residence: Private residence (1 level home, 2 steps) Insurance Resources: [employer], Media planner (specify) ([employer]) Financial Screen Referred: No Living Expenses: Own Money Management: Patient Does the patient have any problems obtaining your medications?: No Home Management: Independent Patient/Family Preliminary Plans: Patient plans to continue to manage Care Coordinator Barriers to Discharge: Lack of/limited family support, Decreased caregiver support Care Coordinator Anticipated Follow Up Needs: HH/OP Expected length of stay: 7-10 Days  Clinical Impression Sw met  with patient, introduced self and explained role. Patent expressed that he was using the bathroom earlier when SW came by. Patient anticipates discharging home at MOD I level. Patient has a 2 level home. Currently pt has a RW, SPC, WC and BSC. Sw will follow up with patient with updates tomorrow. No additional questions or concerns.  Andria Rhein 05/26/2023, 3:28 PM

## 2023-05-26 NOTE — Evaluation (Signed)
Physical Therapy Assessment and Plan  Patient Details  Name: [name] MRN: [id number] Date of Birth: [birth year]  PT Diagnosis: Abnormality of gait, Difficulty walking, Hemiparesis dominant, Hypertonia, Impaired sensation, and Muscle weakness Rehab Potential: Good ELOS: 7-10 days   Today's Date: 05/26/2023 PT Individual Time: 0850-1000 PT Individual Time Calculation (min): 70 min    Hospital Problem: Principal Problem:   CVA (cerebral vascular accident) [name])   Past Medical History:  Past Medical History:  Diagnosis Date   Arthritis    Coronary atherosclerosis of native coronary artery    a. NSTEMI (02/2014):  LHC (02/2014):  Mild disease in LAD and CFX; prox RCA occluded with R-R collats, dist RCA filled by L-R collats, inf HK, EF 55%, LVEDP 15 mmHg.  PCI:  Unsuccessful angioplasty of RCA (late presentation of inf MI) - tx medically.   Essential hypertension    GERD (gastroesophageal reflux disease)    Hyperlipidemia    NSTEMI (non-ST elevated myocardial infarction) (HCC) 03/11/14   Renal cell carcinoma of right kidney (HCC)    Partial nephrectomy in 2013   Past Surgical History:  Past Surgical History:  Procedure Laterality Date   AORTIC VALVE REPLACEMENT N/A 05/18/2023   Procedure: AORTIC VALVE REPLACEMENT (AVR) USING INSPIRIS RESILIA 23 MM AORTIC VALVE;  Surgeon: Alleen Borne, MD;  Location: [facility] OR;  Service: Open Heart Surgery;  Laterality: N/A;   BACK SURGERY     1989   CERVICAL DISC SURGERY  04/29/2021   COLONOSCOPY N/A 12/11/2020   Procedure: COLONOSCOPY;  Surgeon: Franky Macho, MD;  Location: [facility] ENDO SUITE;  Service: Gastroenterology;  Laterality: N/A;   LEFT HEART CATHETERIZATION WITH CORONARY ANGIOGRAM N/A 03/17/2014   Procedure: LEFT HEART CATHETERIZATION WITH CORONARY ANGIOGRAM;  Surgeon: Corky Crafts, MD;  Location: [facility] CATH LAB;  Service: Cardiovascular;  Laterality: N/A;   POLYPECTOMY  12/11/2020   Procedure: POLYPECTOMY;  Surgeon: Franky Macho, MD;   Location: [facility] ENDO SUITE;  Service: Gastroenterology;;   REPLACEMENT ASCENDING AORTA N/A 05/18/2023   Procedure: REPLACEMENT OF ASCENDING AORTIC ANEURYSM USING A 30 X 10 MM HEMASHIELD PLATINUM GRAFT;  Surgeon: Alleen Borne, MD;  Location: [facility] OR;  Service: Open Heart Surgery;  Laterality: N/A;  CIRC ARREST   RIGHT HEART CATH AND CORONARY ANGIOGRAPHY N/A 04/03/2023   Procedure: RIGHT HEART CATH AND CORONARY ANGIOGRAPHY;  Surgeon: Tonny Bollman, MD;  Location: [facility] INVASIVE CV LAB;  Service: Cardiovascular;  Laterality: N/A;   ROBOT ASSISTED LAPAROSCOPIC NEPHRECTOMY  01/14/2012   Procedure: ROBOTIC ASSISTED LAPAROSCOPIC NEPHRECTOMY;  Surgeon: Milford Cage, MD;  Location: [facility] ORS;  Service: Urology;  Laterality: Right;  Robot Laparoscopic Right Partial Nephrectomy     TEE WITHOUT CARDIOVERSION N/A 05/18/2023   Procedure: TRANSESOPHAGEAL ECHOCARDIOGRAM;  Surgeon: Alleen Borne, MD;  Location: [facility] OR;  Service: Open Heart Surgery;  Laterality: N/A;   TONSILLECTOMY      Assessment & Plan Clinical Impression: Patient is a 67 y.o. year old male  who presented to the [name] for AVR and ascending aortic aneurysm repair on 05/18/2023. He had evidence of bicuspid aortic valve stenosis with 5.1 cm ascending aortic aneurysm and underwent repair by Dr. Laneta Simmers on 05/18/2023. He was extubated later that day.  He had brief episode of hematuria after difficult Foley placement.  Remains on Proscar for urinary retention.  Intraoperative TEE with EF approximately 50%.  Pacer wires and chest tubes removed on 7/03.  Chest x-ray was stable and he was transferred out to stepdown unit.  Acute blood loss anemia treated with oral iron replacement.  Aspirin dosing decreased to 81 mg due to post-operative thrombocytopenia.  Later in the evening on 7/03 he was noted to have right hand weakness and rapid response was called.  He was noted to have atrial fibrillation with RVR and was hypotensive.  He was started on an amiodarone  infusion and cardiology consulted.  He was transition to amiodarone 200 mg twice daily on 7/04.  CT of head without evidence of acute infarct or hemorrhage, however brain MRI showed scattered small acute infarcts in the left cerebellar hemisphere in both cerebral hemispheres likely embolic in etiology.  Neurology was consulted.  He was felt to be a candidate for Coumadin due to the recent new aortic valve as well as postoperative A-fib.  No large vessel occlusion noted.  INR goal is 2.0.  BLE venous duplex negative for DVT on 7/06. Hemoglobin is stable and platelet count is now normal. His incision is healing without signs of infection and he is tolerating his diet. The patient requires inpatient medicine and rehabilitation evaluations and services for ongoing dysfunction secondary to acute CVA, Severe AS, aorotic aneurysm s/p repair.   Past medical history includes for lipidemia, coronary artery disease status post NSTEMI in 2015 with catheterization showing an occluded proximal RCA with right to right collaterals filling the distal vessel that cannot be opened, renal cell carcinoma status post partial right nephrectomy in 2013, severe degenerative spine disease.  Previous lower back surgery in the 1980s and a cervical disc surgery in 2022. Patient transferred to [name] on 05/25/2023 .   Patient currently requires min assist with mobility secondary to muscle weakness, decreased cardiorespiratoy endurance, impaired timing and sequencing, abnormal tone, and unbalanced muscle activation, decreased memory, and decreased standing balance, decreased postural control, and decreased balance strategies.  Prior to hospitalization, patient was modified independent  with mobility and lived with Son (son, son's significant other, twin grandsons and 60y.o. granddaughter) in a House home.  Home access is 2Stairs to enter.  Patient will benefit from skilled PT intervention to maximize safe functional mobility, minimize fall risk,  and decrease caregiver burden for planned discharge home with 24 hour supervision.  Anticipate patient will benefit from follow up OP at discharge.      PT Evaluation Precautions/Restrictions Precautions Precautions: Fall;Sternal Restrictions Weight Bearing Restrictions: No (only sternal precautions) Pain Pain Assessment Pain Scale: 0-10 Pain Score: 0-No pain (states not pain, just discomfort from not having BM) Pain Interference Pain Interference Pain Effect on Sleep: 0. Does not apply - I have not had any pain or hurting in the past 5 days Pain Interference with Therapy Activities: 1. Rarely or not at all Pain Interference with Day-to-Day Activities: 1. Rarely or not at all Home Living/Prior Functioning Home Living Available Help at Discharge: Available 24 hours/day;Family (wife lives across the street; son and is significant other are at home all the time (don't work)) Type of Home: House Home Access: Stairs to enter Secretary/administrator of Steps: 2 Entrance Stairs-Rails: Right;Left Home Layout: One level Additional Comments: reports "on a bad day" he would use a SPC; reports ~9yrs ago he had gradual onset of B LE weakness and numbness/tingling with some gait deviations in R LE due to this (pt reports no known cause)  Lives With: Son (son, son's significant other, twin grandsons and 14y.o. granddaughter) Prior Function Level of Independence: Independent with gait;Requires assistive device for independence;Independent with transfers;Independent with homemaking with ambulation (reports he was able to  do laundry, cooking, etc. and was his wife's caregiver)  Able to Take Stairs?: Yes Driving: Yes Vocation: Retired Vision/Perception  Vision - History Ability to See in Adequate Light: 0 Adequate (wearing glasses) Perception Perception: Within Functional Limits Praxis Praxis: Intact  Cognition Overall Cognitive Status: Within Functional Limits for tasks  assessed Arousal/Alertness: Awake/alert Orientation Level: Oriented X4 Year: 2024 Month: July Day of Week: Correct Attention: Focused;Sustained Focused Attention: Appears intact Sustained Attention: Appears intact Safety/Judgment: Appears intact Sensation Sensation Light Touch: Impaired Detail Peripheral sensation comments: able to sense light touch but reports onset of numbness in B LEs approximately 2 yrs ago Light Touch Impaired Details: Impaired RLE;Impaired LLE Hot/Cold: Not tested Proprioception: Impaired by gross assessment (in R LE) Stereognosis: Not tested Coordination Gross Motor Movements are Fluid and Coordinated: No Fine Motor Movements are Fluid and Coordinated: No Coordination and Movement Description: impaired GM movements in R LE due to paresis, unbalanced muscle activation, and tone Motor  Motor Motor: Hemiplegia;Abnormal tone Motor - Skilled Clinical Observations: R hemiparesis on top of baseline R LE impairments   Trunk/Postural Assessment  Cervical Assessment Cervical Assessment: Within Functional Limits Thoracic Assessment Thoracic Assessment: Within Functional Limits Lumbar Assessment Lumbar Assessment: Within Functional Limits Postural Control Postural Control: Within Functional Limits  Balance Balance Balance Assessed: Yes Static Sitting Balance Static Sitting - Balance Support: Feet supported Static Sitting - Level of Assistance: 5: Stand by assistance Dynamic Sitting Balance Dynamic Sitting - Balance Support: Feet supported Dynamic Sitting - Level of Assistance: 5: Stand by assistance Static Standing Balance Static Standing - Balance Support: During functional activity;Right upper extremity supported Static Standing - Level of Assistance: 4: Min assist;Other (comment) (CGA) Dynamic Standing Balance Dynamic Standing - Balance Support: During functional activity;Bilateral upper extremity supported Dynamic Standing - Level of Assistance: 4:  Min assist Extremity Assessment      RLE Assessment RLE Assessment: Exceptions to [facility] Active Range of Motion (AROM) Comments: tightness noted in heel cord General Strength Comments: assessed in sitting RLE Strength Right Hip Flexion: 3/5 Right Knee Flexion: 3+/5 Right Knee Extension: 4-/5 Right Ankle Dorsiflexion: 2+/5 (tightness in heel cord) Right Ankle Plantar Flexion: 2/5 RLE Tone RLE Tone: Modified Ashworth Body Part - Modified Ashworth Scale: Hamstrings;Quadriceps (assessed in supine) Hamstrings - Modified Ashworth Scale for Grading Hypertonia RLE: Slight increase in muscle tone, manifested by a catch, followed by minimal resistance throughout the remainder (less than half) of the ROM QUADRICEPS - Modified Ashworth Scale for Grading Hypertonia RLE: Slight increase in muscle tone, manifested by a catch, followed by minimal resistance throughout the remainder (less than half) of the ROM RLE Tone Comments: pt reports he has difficulty coordinating turning muscles on/of and occasionally will have spasms LLE Assessment LLE Assessment: Within Functional Limits Active Range of Motion (AROM) Comments: WFL/WNL General Strength Comments: assessed in sitting LLE Strength Left Hip Flexion: 5/5 Left Knee Flexion: 4+/5 Left Knee Extension: 4+/5 Left Ankle Dorsiflexion: 4+/5 Left Ankle Plantar Flexion: 4+/5  Care Tool Care Tool Bed Mobility Roll left and right activity   Roll left and right assist level: Minimal Assistance - Patient > 75%    Sit to lying activity   Sit to lying assist level: Minimal Assistance - Patient > 75%    Lying to sitting on side of bed activity   Lying to sitting on side of bed assist level: the ability to move from lying on the back to sitting on the side of the bed with no back support.: Contact Guard/Touching assist  Care Tool Transfers Sit to stand transfer   Sit to stand assist level: Minimal Assistance - Patient > 75%    Chair/bed transfer    Chair/bed transfer assist level: Minimal Assistance - Patient > 75%     Psychologist, clinical transfer assist level: Minimal Assistance - Patient > 75%      Care Tool Locomotion Ambulation   Assist level: Minimal Assistance - Patient > 75% Assistive device: Hand held assist Max distance: 334ft  Walk 10 feet activity   Assist level: Minimal Assistance - Patient > 75% Assistive device: Hand held assist   Walk 50 feet with 2 turns activity   Assist level: Minimal Assistance - Patient > 75% Assistive device: Hand held assist  Walk 150 feet activity   Assist level: Minimal Assistance - Patient > 75% Assistive device: Hand held assist  Walk 10 feet on uneven surfaces activity   Assist level: Minimal Assistance - Patient > 75% Assistive device: Hand held assist  Stairs   Assist level: Minimal Assistance - Patient > 75% Stairs assistive device: 2 hand rails Max number of stairs: 12  Walk up/down 1 step activity   Walk up/down 1 step (curb) assist level: Minimal Assistance - Patient > 75% Walk up/down 1 step or curb assistive device: 2 hand rails  Walk up/down 4 steps activity   Walk up/down 4 steps assist level: Minimal Assistance - Patient > 75% Walk up/down 4 steps assistive device: 2 hand rails  Walk up/down 12 steps activity   Walk up/down 12 steps assist level: Minimal Assistance - Patient > 75% Walk up/down 12 steps assistive device: 2 hand rails  Pick up small objects from floor   Pick up small object from the floor assist level: Moderate Assistance - Patient 50 - 74% Pick up small object from the floor assistive device: HHA  Wheelchair Is the patient using a wheelchair?: No          Wheel 50 feet with 2 turns activity      Wheel 150 feet activity        Refer to Care Plan for Long Term Goals  SHORT TERM GOAL WEEK 1 PT Short Term Goal 1 (Week 1): = to LTGs based on ELOS  Recommendations for other services: Therapeutic Recreation  Stress  management and Outing/community reintegration  Skilled Therapeutic Intervention Pt received sitting in recliner and is agreeable to therapy session; however, reports he is feeling very bloated and having difficulty passing BM. Notified MD and nurse via secure chat. Evaluation completed (see details above) with patient education regarding purpose of PT evaluation, PT POC and goals, therapy schedule, weekly team meetings, and other [name] information including safety plan and fall risk safety. Pt performed the below functional mobility tasks with the specified levels of skilled cuing and assistance. Pt able to recall sternal precautions when questioned. Pt reports hx of R LE gait impairments with unknown cause that onset ~2yrs ago, but that they are now exacerbated. MD in/out for morning assessment. Pt demonstrates lack of R LE hip/knee flexion during swing and rather keeps LE in knee extended position with lack of foot clearance and slight circumduction compensation. Noticed spasticity upon testing as detailed above. At end of session, pt left supine in bed with needs in reach and bed alarm on.  Mobility Bed Mobility Bed Mobility: Supine to Sit;Sit to Supine Supine to Sit: Minimal Assistance - Patient > 75% Sit to Supine:  Contact Guard/Touching assist Transfers Transfers: Sit to Stand;Stand Pivot Transfers;Stand to Sit Sit to Stand: Minimal Assistance - Patient > 75% Stand to Sit: Minimal Assistance - Patient > 75% Stand Pivot Transfers: Minimal Assistance - Patient > 75% Stand Pivot Transfer Details: Verbal cues for technique;Verbal cues for gait pattern;Verbal cues for sequencing;Verbal cues for precautions/safety Transfer (Assistive device): 1 person hand held assist [facility]) Locomotion  Gait Ambulation: Yes Gait Assistance: Minimal Assistance - Patient > 75% Gait Distance (Feet): 300 Feet Assistive device: 1 person hand held assist (R HHA) Gait Assistance Details: Verbal cues for gait  pattern;Verbal cues for technique;Verbal cues for sequencing;Tactile cues for sequencing;Tactile cues for posture;Tactile cues for weight shifting Gait Gait: Yes Gait Pattern: Impaired Gait Pattern: Step-through pattern;Decreased hip/knee flexion - right;Decreased dorsiflexion - right;Poor foot clearance - right;Narrow base of support;Decreased stance time - right (pt reports some of these deficits are baseline due to onset of B LE weakness and tingling ~43yrs ago with no known cause, but are now exacerbated) Gait velocity: decreased Stairs / Additional Locomotion Stairs: Yes Stairs Assistance: Minimal Assistance - Patient > 75% Stair Management Technique: Two rails;Step to pattern;Forwards (primarily step-to pattern leading with varying LEs - demos lack of adequate R LE hip/knee flexion during swing) Number of Stairs: 12 Height of Stairs: 6 Ramp: Minimal Assistance - Patient >75% Wheelchair Mobility Wheelchair Mobility: No   Discharge Criteria: Patient will be discharged from PT if patient refuses treatment 3 consecutive times without medical reason, if treatment goals not met, if there is a change in medical status, if patient makes no progress towards goals or if patient is discharged from hospital.  The above assessment, treatment plan, treatment alternatives and goals were discussed and mutually agreed upon: by patient  Ginny Forth , PT, DPT, NCS, CSRS 05/26/2023, 9:57 AM

## 2023-05-26 NOTE — Progress Notes (Signed)
PROGRESS NOTE   Subjective/Complaints:  No issues overnight but the patient does feel bloated.  He received laxatives today.  He has no abdominal pain.  No incontinence of bowel or bladder. He still feels weak in the right upper extremity.  He denies any speech issues Review of systems negative chest pain shortness of breath nausea vomiting diarrhea  Objective:   No results found. Recent Labs    05/26/23 0544  WBC 8.8  HGB 8.5*  HCT 26.8*  PLT 247   Recent Labs    05/26/23 0544  NA 137  K 4.3  CL 105  CO2 22  GLUCOSE 104*  BUN 29*  CREATININE 0.93  CALCIUM 8.5*    Intake/Output Summary (Last 24 hours) at 05/26/2023 0837 Last data filed at 05/26/2023 0817 Gross per 24 hour  Intake 240 ml  Output --  Net 240 ml        Physical Exam: Vital Signs Blood pressure (!) 150/89, pulse 76, temperature 98.1 F (36.7 C), temperature source Oral, resp. rate 18, height 5\' 8"  (1.727 m), weight 74.3 kg, SpO2 100 %.   General: No acute distress Mood and affect are appropriate Heart: Regular rate and rhythm no rubs murmurs or extra sounds Lungs: Clear to auscultation, breathing unlabored, no rales or wheezes Abdomen: Positive bowel sounds, soft nontender to palpation, nondistended Extremities: No clubbing, cyanosis, or edema Skin: No evidence of breakdown, no evidence of rash Neurologic: Cranial nerves II through XII intact, motor strength is 5/5 in left deltoid, bicep, tricep, grip, hip flexor, knee extensors, ankle dorsiflexor and plantar flexor 3/5 strength in the right deltoid, bicep, tricep, grip, 4/5 strength in the right hip flexor knee extensor ankle dorsiflexor and plantar flexor Sensory exam normal sensation to light touch  in bilateral upper and lower extremities Finger to thumb opposition is reduced he can oppose his index and middle finger but not the ring and little finger.  The patient has very slow  opposition Cerebellar exam normal finger to nose to finger Musculoskeletal: Full range of motion in all 4 extremities. No joint swelling   Assessment/Plan: 1. Functional deficits which require 3+ hours per day of interdisciplinary therapy in a comprehensive inpatient rehab setting. Physiatrist is providing close team supervision and 24 hour management of active medical problems listed below. Physiatrist and rehab team continue to assess barriers to discharge/monitor patient progress toward functional and medical goals  Care Tool:  Bathing              Bathing assist       Upper Body Dressing/Undressing Upper body dressing        Upper body assist      Lower Body Dressing/Undressing Lower body dressing            Lower body assist       Toileting Toileting    Toileting assist Assist for toileting: Contact Guard/Touching assist Assistive Device Comment: RW   Transfers Chair/bed transfer  Transfers assist           Locomotion Ambulation   Ambulation assist              Walk 10 feet activity  Assist           Walk 50 feet activity   Assist           Walk 150 feet activity   Assist           Walk 10 feet on uneven surface  activity   Assist           Wheelchair     Assist               Wheelchair 50 feet with 2 turns activity    Assist            Wheelchair 150 feet activity     Assist          Blood pressure (!) 150/89, pulse 76, temperature 98.1 F (36.7 C), temperature source Oral, resp. rate 18, height 5\' 8"  (1.727 m), weight 74.3 kg, SpO2 100 %.  Medical Problem List and Plan: 1. 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  Primarily has right upper extremity weakness and decreased balance             -patient may shower             -ELOS/Goals: 7-10 days, mod I goals with PT and OT   2.   Antithrombotics: -DVT/anticoagulation:  Pharmaceutical: Coumadin             -antiplatelet therapy: Aspirin 81 mg indefinitely   3. Pain Management. Has long hx of low back pain followed by ortho. Having intermittent radicular-type symptoms in the RLE. Also has had cervical symptoms as well -Tylenol, tramodol, oxycodone as needed             -baclofen 10 mg daily             -consider trial of gabapentin             -encouraged stretching, oob, physical activity to tolerance   4. Mood/Behavior/Sleep: LCSW to evaluate and provide emotional support             -antipsychotic agents: n/a   5. Neuropsych/cognition: This patient is capable of making decisions on his own behalf.   6. Skin/Wound Care: Routine skin care checks             -remove chest tube sutures on 7/12             -routine sternotomy incision care   7. Fluids/Electrolytes/Nutrition: Routine Is and Os and follow-up chemistries   8: Hypertension: monitor TID and prn             -continue Lopressor 12.5 mg Bid   9: Hyperlipidemia: continue statin   11: severe aortic stenosis s/p AVR and aortic root replacement 7/01             -sternal precautions, coumadin             -follow-up with Dr. Laneta Simmers -TTE in 6 weeks, SBE prophylaxis    12: postop atrial fibrillation:rate controlled; on Coumadin (pharmacy consult)             -follow INR -continue amiodarone 200mg  daily   13: ABLA: stable; follow-up CBC             -continue oral iron supplementation   14: azotemia: encourage oral fluids and follow-up BMP   15: GERD: continue PPI   16: ? BPH: continue Proscar 5 mg daily             -  monitor voiding patterns          LOS: 1 days A FACE TO FACE EVALUATION WAS PERFORMED  Erick Colace 05/26/2023, 8:37 AM

## 2023-05-26 NOTE — Progress Notes (Signed)
Inpatient Rehabilitation Center Individual Statement of Services  Patient Name:  [name]  Date:  05/26/2023  Welcome to the Inpatient Rehabilitation Center.  Our goal is to provide you with an individualized program based on your diagnosis and situation, designed to meet your specific needs.  With this comprehensive rehabilitation program, you will be expected to participate in at least 3 hours of rehabilitation therapies Monday-Friday, with modified therapy programming on the weekends.  Your rehabilitation program will include the following services:  Physical Therapy (PT), Occupational Therapy (OT), Speech Therapy (ST), 24 hour per day rehabilitation nursing, Therapeutic Recreaction (TR), Neuropsychology, Care Coordinator, Rehabilitation Medicine, Nutrition Services, Pharmacy Services, and Other  Weekly team conferences will be held on Wednesdays to discuss your progress.  Your Inpatient Rehabilitation Care Coordinator will talk with you frequently to get your input and to update you on team discussions.  Team conferences with you and your family in attendance may also be held.  Expected length of stay: 7-10 Days Overall anticipated outcome:  MOD I  Depending on your progress and recovery, your program may change. Your Inpatient Rehabilitation Care Coordinator will coordinate services and will keep you informed of any changes. Your Inpatient Rehabilitation Care Coordinator's name and contact numbers are listed  below.  The following services may also be recommended but are not provided by the Inpatient Rehabilitation Center:   Home Health Rehabiltiation Services Outpatient Rehabilitation Services    Arrangements will be made to provide these services after discharge if needed.  Arrangements include referral to agencies that provide these services.  Your insurance has been verified to be:  Medicare A & B Your primary doctor is:  [facility]  Pertinent information will  be shared with your doctor and your insurance company.  Inpatient Rehabilitation Care Coordinator:  Lavera Guise, [place] [phone] or [phone]  Information discussed with and copy given to patient by: Andria Rhein, 05/26/2023, 9:36 AM

## 2023-05-26 NOTE — Plan of Care (Signed)
  Problem: RH Balance Goal: LTG Patient will maintain dynamic sitting balance (PT) Description: LTG:  Patient will maintain dynamic sitting balance with assistance during mobility activities (PT) Flowsheets (Taken 05/26/2023 2056) LTG: Pt will maintain dynamic sitting balance during mobility activities with:: Independent with assistive device  Goal: LTG Patient will maintain dynamic standing balance (PT) Description: LTG:  Patient will maintain dynamic standing balance with assistance during mobility activities (PT) Flowsheets (Taken 05/26/2023 2056) LTG: Pt will maintain dynamic standing balance during mobility activities with:: Supervision/Verbal cueing   Problem: Sit to Stand Goal: LTG:  Patient will perform sit to stand with assistance level (PT) Description: LTG:  Patient will perform sit to stand with assistance level (PT) Flowsheets (Taken 05/26/2023 2056) LTG: PT will perform sit to stand in preparation for functional mobility with assistance level: Supervision/Verbal cueing   Problem: RH Bed Mobility Goal: LTG Patient will perform bed mobility with assist (PT) Description: LTG: Patient will perform bed mobility with assistance, with/without cues (PT). Flowsheets (Taken 05/26/2023 2056) LTG: Pt will perform bed mobility with assistance level of: Independent with assistive device    Problem: RH Bed to Chair Transfers Goal: LTG Patient will perform bed/chair transfers w/assist (PT) Description: LTG: Patient will perform bed to chair transfers with assistance (PT). Flowsheets (Taken 05/26/2023 2056) LTG: Pt will perform Bed to Chair Transfers with assistance level: Supervision/Verbal cueing   Problem: RH Car Transfers Goal: LTG Patient will perform car transfers with assist (PT) Description: LTG: Patient will perform car transfers with assistance (PT). Flowsheets (Taken 05/26/2023 2056) LTG: Pt will perform car transfers with assist:: Supervision/Verbal cueing   Problem: RH  Ambulation Goal: LTG Patient will ambulate in controlled environment (PT) Description: LTG: Patient will ambulate in a controlled environment, # of feet with assistance (PT). Flowsheets (Taken 05/26/2023 2056) LTG: Pt will ambulate in controlled environ  assist needed:: Supervision/Verbal cueing LTG: Ambulation distance in controlled environment: 151ft using LRAD Goal: LTG Patient will ambulate in home environment (PT) Description: LTG: Patient will ambulate in home environment, # of feet with assistance (PT). Flowsheets (Taken 05/26/2023 2056) LTG: Pt will ambulate in home environ  assist needed:: Supervision/Verbal cueing LTG: Ambulation distance in home environment: 71ft using LRAD   Problem: RH Stairs Goal: LTG Patient will ambulate up and down stairs w/assist (PT) Description: LTG: Patient will ambulate up and down # of stairs with assistance (PT) Flowsheets (Taken 05/26/2023 2056) LTG: Pt will ambulate up/down stairs assist needed:: Supervision/Verbal cueing LTG: Pt will  ambulate up and down number of stairs: 4 steps using HRs per home set-up

## 2023-05-26 NOTE — Progress Notes (Signed)
Patient ID: [name], male   DOB: [birth year], 67 y.o.   MRN: [id number]  Sw made attempt to meet patient and complete assessment. Patient currently not in the room. Sw will continue to follow up.

## 2023-05-26 NOTE — Progress Notes (Signed)
Inpatient Rehabilitation  Patient information reviewed and entered into eRehab system by Davinia Riccardi M. Dorlis Judice, M.A., CCC/SLP, PPS Coordinator.  Information including medical coding, functional ability and quality indicators will be reviewed and updated through discharge.    

## 2023-05-26 NOTE — Progress Notes (Signed)
   05/26/23 1500  Spiritual Encounters  Type of Visit Initial  Care provided to: Patient  Referral source Family  Reason for visit Routine spiritual support  OnCall Visit No   Ch responded to family request to check on pt. Pt declined visit because he said that he needed to rest. No follow-up needed at this time.

## 2023-05-26 NOTE — Progress Notes (Signed)
Patient ID: [name], male   DOB: Apr 05, 1956, 67 y.o.   MRN: [id number] Met with the patient to review current situation, rehab process, team conference and plan of care. Discussed constipation management; concerns forwarded to nurse/PAC with new orders received for laxatives and scheduled stool softeners post cardiac surgery. Reviewed medications and dietary modification recommendations with HLD and CAD. Discussed new order for TLSO/LSO brace. Continue to follow along to address educational needs to facilitate preparation for discharge. Pamelia Hoit

## 2023-05-27 LAB — PROTIME-INR
INR: 2.6 — ABNORMAL HIGH (ref 0.8–1.2)
Prothrombin Time: 27.8 s — ABNORMAL HIGH (ref 11.4–15.2)

## 2023-05-27 MED ORDER — WARFARIN 1.25 MG HALF TABLET
1.2500 mg | ORAL_TABLET | Freq: Once | ORAL | Status: AC
  Administered 2023-05-27: 1.25 mg via ORAL
  Filled 2023-05-27: qty 1

## 2023-05-27 NOTE — Progress Notes (Signed)
Physical Therapy Session Note  Patient Details  Name: [name] MRN: [id number] Date of Birth: October 04, 1956  Today's Date: 05/27/2023 PT Individual Time: 0923-1031 PT Individual Time Calculation (min): 68 min   Short Term Goals: Week 1:  PT Short Term Goal 1 (Week 1): = to LTGs based on ELOS  Skilled Therapeutic Interventions/Progress Updates:    Pt received sitting in recliner and agreeable to therapy session. Pt reports that his primary concern is R UE weakness and overall impaired endurance. Pt expresses concerns that his R LE deficits will not improve since they have been present for ~2years. Pt inquiring if R UE hand splint is hindering him from moving his R hand - educated to only wear when sleeping, but pt states that he still tries to [employer] his hand at night because he is a light sleeper - will defer to OT for further education. Pt reports he felt the [facility] helped hold his ankle in a good position and tolerated wearing it well last night.  Pt wearing tennis shoes now.  Sit<>stands throughout session without AD with CGA for steadying/safety - demos great compliance with sternal precautions.  Gait training ~155ft to dayroom, no AD, with CGA and 3x light min assist towards end of gait for balance due to tripping over R toes - continues to have decreased R LE hip/knee flexion during swing and rather keeps full knee extension with minor circumduction compensation with toe drag - able to correct ~50% of the time with consistent verbal cuing.   Stepped on/off treadmill using B UE support on litegait handles with CGA for steadying/safety - cuing for increased R hip/knee flexion when stepping up onto treadmill.  Sitting in chair after gait to gym: BP 104/71 (MAP 82), HR 64bpm, SpO2 100%  Standing with B UE support, donned harness.   Gait training on treadmill in litegait harness for safety but no BWS with cuing/facilitation for improved gait mechanics as described below:  1st  trial: 0.51mph increased to 0.46mph for 71min30sec totaling 66ft 2nd trial: 0.20mph increased to 0.55mph for 73min32sec totaling 194ft 3rd trial: 0.71mph wearing 2.5lb ankle weight for totaling 167ft Cuing throughout for increased R hip/knee flexion during swing to improve foot clearance and cuing for heel strike at initial contact as able, with pt having limited ankle DF active movement. Pt fatigues quickly with gait mechanics deteriorating because of this. Requires seated rest break between each trial.   Vitals during gait training:  After 1st gait trial: BP 95/68 (MAP 79), HR 69bpm  After 1st trial's seated rest break: BP 102/75 (MAP 84), HR 66bpm  After 2nd gait trial: BP 97/66 (MAP 77), HR 70bpm, encouraged fluid intake After 3rd gait trial: BP 101/86 (MAP 93), HR 75bpm   Pt states "I can't believe a minute and a half wears me out this bad." Pt states "I just don't have a whole lot of energy." Indicating pt's significant endurance impairments. Pt repeatedly states he is "exhausted" after each gait trial.  Discussed with pt referral to Rec Therapy and option to possibly participate in an outing during his stay - pt to think this over and discuss his decision with primary OT this afternoon.  Gait training ~167ft back to room, no UE support, with CGA/light min assist as described above with pt more fatigued having decreased R foot clearance during swing.  At end of session, pt left seated in recliner with needs in reach.   Therapy Documentation Precautions:  Precautions Precautions: Fall, Sternal Precaution  Comments: sternal precautions Restrictions Weight Bearing Restrictions: No RUE Weight Bearing: Non weight bearing LUE Weight Bearing: Non weight bearing Other Position/Activity Restrictions: sternal precautions   Pain:  Denies pain during session.    Therapy/Group: Individual Therapy  Ginny Forth , PT, DPT, NCS, CSRS 05/27/2023, 7:50 AM

## 2023-05-27 NOTE — Progress Notes (Signed)
ANTICOAGULATION CONSULT NOTE  Pharmacy Consult for Warfarin Indication:  CVA  Allergies  Allergen Reactions   Alcohol Other (See Comments)    Pts skin gets red. Ex. Pt used alcohol based deodorant and got red things under arms. & Drinks alcohol notices "from heart on up" everything turns red.   Chlorthalidone     Dizziness and Syncopal Episode    Patient Measurements: Height: 5\' 8"  (172.7 cm) Weight: 74.3 kg (163 lb 14.4 oz) IBW/kg (Calculated) : 68.4  Vital Signs: Temp: 98.1 F (36.7 C) (07/10 0537) BP: 125/78 (07/10 0537) Pulse Rate: 67 (07/10 0537)  Labs: Recent Labs    05/25/23 0127 05/26/23 0544 05/27/23 0744  HGB  --  8.5*  --   HCT  --  26.8*  --   PLT  --  247  --   LABPROT 36.4* 25.7* 27.8*  INR 3.6* 2.3* 2.6*  CREATININE  --  0.93  --      Estimated Creatinine Clearance: 74.6 mL/min (by C-G formula based on SCr of 0.93 mg/dL).  Assessment: [name] who was initiated on warfarin by TCTS MD on 05/22/23 during prior inpatient admission and managed by TCTS MD. Once transferred to rehab, confirmed with TCTS that pharmacy will now be managing warfarin.  While inpatient, patient received 5mg  dose on 7/5, 5mg  dose on 7/6, and 2.5mg  dose on 7/7. INR increased from 2.6 to 3.6 on 7/8, which resulted in holding warfarin dose on 7/8. INR today is therapeutic at 2.6 with no s/sx of bleeding.  Of note, amiodarone decreased to 200 mg daily on 7/10. Amiodarone can increase warfarin exposure and INR, therefore pharmacy will closely monitor this drug interaction and adjust dosing as necessary.  Goal of Therapy:  INR 2-3 Monitor platelets by anticoagulation protocol: Yes   Plan:  Give warfarin 1.25 mg PO x1 Daily PT/INR Monitor H/H, platelets, and s/sx of bleeding  Thank you for involving pharmacy in this patient's care.  Loura Back, PharmD, BCPS Clinical Pharmacist Clinical phone for 05/27/2023 is [phone] 05/27/2023 12:52 PM

## 2023-05-27 NOTE — IPOC Note (Signed)
Overall Plan of Care [facility]) Patient Details Name: [name] MRN: [id number] DOB: [birth year]  Admitting Diagnosis: CVA (cerebral vascular accident) [facility])  Hospital Problems: Principal Problem:   CVA (cerebral vascular accident) (HCC)     Functional Problem List: Nursing Endurance, Medication Management, Safety  PT Balance, Motor, Safety, Sensory, Skin Integrity, Endurance, Pain  OT    SLP    TR         Basic ADL's: OT Bathing, Dressing, Toileting     Advanced  ADL's: OT Simple Meal Preparation     Transfers: PT Bed Mobility, Bed to Chair, Furniture, Customer service manager, Research scientist (life sciences): PT Ambulation, Stairs     Additional Impairments: OT Fuctional Use of Upper Extremity  SLP        TR      Anticipated Outcomes Item Anticipated Outcome  Self Feeding Supervision  Swallowing      Basic self-care  Supervision  Toileting  Supervision   Bathroom Transfers Supervision  Bowel/Bladder  n/a  Transfers  supervision using LRAD  Locomotion  supervision using LRAD  Communication     Cognition     Pain  n/a  Safety/Judgment  manage w cues   Therapy Plan: PT Intensity: Minimum of 1-2 x/day ,45 to 90 minutes PT Frequency: 5 out of 7 days PT Duration Estimated Length of Stay: 7-10 days OT Intensity: Minimum of 1-2 x/day, 45 to 90 minutes OT Frequency: 5 out of 7 days OT Duration/Estimated Length of Stay: 7-10 days     Team Interventions: Nursing Interventions Disease Management/Prevention, Medication Management, Discharge Planning, Patient/Family Education  PT interventions Ambulation/gait training, Cognitive remediation/compensation, Discharge planning, DME/adaptive equipment instruction, Functional mobility training, Pain management, Psychosocial support, Splinting/orthotics, Therapeutic Activities, UE/LE Strength taining/ROM, Warden/ranger, Community reintegration, Disease management/prevention, Neuromuscular re-education,  Functional electrical stimulation, Patient/family education, Skin care/wound management, Stair training, Therapeutic Exercise, UE/LE Coordination activities  OT Interventions Balance/vestibular training, Discharge planning, Functional electrical stimulation, Pain management, Self Care/advanced ADL retraining, Therapeutic Activities, UE/LE Coordination activities, Disease mangement/prevention, Functional mobility training, Patient/family education, Skin care/wound managment, Therapeutic Exercise, DME/adaptive equipment instruction, Neuromuscular re-education, Psychosocial support, Splinting/orthotics, UE/LE Strength taining/ROM, Community reintegration  SLP Interventions    TR Interventions    SW/CM Interventions Discharge Planning, Psychosocial Support, Patient/Family Education, Disease Management/Prevention   Barriers to Discharge MD  Medical stability  Nursing Decreased caregiver support, Home environment access/layout 1 level 2/4 ste bil rails w spouse  PT Decreased caregiver support    OT      SLP      SW Lack of/limited family support, Decreased caregiver support     Team Discharge Planning: Destination: PT-Home ,OT- Home , SLP-  Projected Follow-up: PT-Outpatient PT, 24 hour supervision/assistance, OT-  Outpatient OT, SLP-  Projected Equipment Needs: PT-To be determined, OT- To be determined, SLP-  Equipment Details: PT- , OT-  Patient/family involved in discharge planning: PT- Patient,  OT-Patient, SLP-   MD ELOS: 7-10d Medical Rehab Prognosis:  Good Assessment: The patient has been admitted for [name] therapies with the diagnosis of CVA. The team will be addressing functional mobility, strength, stamina, balance, safety, adaptive techniques and equipment, self-care, bowel and bladder mgt, patient and caregiver education, anticoagulation management. Goals have been set at Sup. Anticipated discharge destination is Home with family assist.        See Team Conference Notes for  weekly updates to the plan of care

## 2023-05-27 NOTE — Progress Notes (Signed)
PROGRESS NOTE   Subjective/Complaints:  No issues overnite , discussed bracing   Review of systems negative chest pain shortness of breath nausea vomiting diarrhea  Objective:   No results found. Recent Labs    05/26/23 0544  WBC 8.8  HGB 8.5*  HCT 26.8*  PLT 247    Recent Labs    05/26/23 0544  NA 137  K 4.3  CL 105  CO2 22  GLUCOSE 104*  BUN 29*  CREATININE 0.93  CALCIUM 8.5*     Intake/Output Summary (Last 24 hours) at 05/27/2023 7829 Last data filed at 05/26/2023 1805 Gross per 24 hour  Intake 240 ml  Output --  Net 240 ml         Physical Exam: Vital Signs Blood pressure 125/78, pulse 67, temperature 98.1 F (36.7 C), resp. rate 16, height 5\' 8"  (1.727 m), weight 74.3 kg, SpO2 100 %.   General: No acute distress Mood and affect are appropriate Heart: Regular rate and rhythm no rubs murmurs or extra sounds Lungs: Clear to auscultation, breathing unlabored, no rales or wheezes Abdomen: Positive bowel sounds, soft nontender to palpation, nondistended Extremities: No clubbing, cyanosis, or edema Skin: No evidence of breakdown, no evidence of rash Neurologic: Cranial nerves II through XII intact, motor strength is 5/5 in left deltoid, bicep, tricep, grip, hip flexor, knee extensors, ankle dorsiflexor and plantar flexor 3/5 strength in the right deltoid, bicep, tricep, grip, 4/5 strength in the right hip flexor knee extensor ankle dorsiflexor and plantar flexor Unchanged  Sensory exam normal sensation to light touch  in bilateral upper and lower extremities Finger to thumb opposition is reduced he can oppose his index and middle finger but not the ring and little finger.  The patient has very slow opposition  Musculoskeletal: Full range of motion in all 4 extremities. No joint swelling   Assessment/Plan: 1. Functional deficits which require 3+ hours per day of interdisciplinary therapy in a  comprehensive inpatient rehab setting. Physiatrist is providing close team supervision and 24 hour management of active medical problems listed below. Physiatrist and rehab team continue to assess barriers to discharge/monitor patient progress toward functional and medical goals  Care Tool:  Bathing    Body parts bathed by patient: Right arm, Left arm, Chest, Abdomen, Front perineal area, Right upper leg, Left upper leg, Face   Body parts bathed by helper: Buttocks, Right lower leg, Left lower leg     Bathing assist Assist Level: Minimal Assistance - Patient > 75%     Upper Body Dressing/Undressing Upper body dressing   What is the patient wearing?: Pull over shirt    Upper body assist Assist Level: Minimal Assistance - Patient > 75%    Lower Body Dressing/Undressing Lower body dressing      What is the patient wearing?: Pants     Lower body assist Assist for lower body dressing: Moderate Assistance - Patient 50 - 74%     Toileting Toileting    Toileting assist Assist for toileting: Moderate Assistance - Patient 50 - 74% Assistive Device Comment: RW   Transfers Chair/bed transfer  Transfers assist     Chair/bed transfer assist level: Minimal Assistance -  Patient > 75%     Locomotion Ambulation   Ambulation assist      Assist level: Minimal Assistance - Patient > 75% Assistive device: Hand held assist Max distance: 368ft   Walk 10 feet activity   Assist     Assist level: Minimal Assistance - Patient > 75% Assistive device: Hand held assist   Walk 50 feet activity   Assist    Assist level: Minimal Assistance - Patient > 75% Assistive device: Hand held assist    Walk 150 feet activity   Assist    Assist level: Minimal Assistance - Patient > 75% Assistive device: Hand held assist    Walk 10 feet on uneven surface  activity   Assist     Assist level: Minimal Assistance - Patient > 75% Assistive device: Hand held assist    Wheelchair     Assist Is the patient using a wheelchair?: No             Wheelchair 50 feet with 2 turns activity    Assist            Wheelchair 150 feet activity     Assist          Blood pressure 125/78, pulse 67, temperature 98.1 F (36.7 C), resp. rate 16, height 5\' 8"  (1.727 m), weight 74.3 kg, SpO2 100 %.  Medical Problem List and Plan: 1. 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  Primarily has right upper extremity weakness and decreased balance             -patient may shower             -ELOS/Goals: 7-10 days, mod I goals with PT and OT   2.  Antithrombotics: -DVT/anticoagulation:  Pharmaceutical: Coumadin             -antiplatelet therapy: Aspirin 81 mg indefinitely   3. Pain Management. Has long hx of low back pain followed by ortho. Having intermittent radicular-type symptoms in the RLE. Also has had cervical symptoms as well -Tylenol, tramodol, oxycodone as needed             -baclofen 10 mg daily             -consider trial of gabapentin             -encouraged stretching, oob, physical activity to tolerance   4. Mood/Behavior/Sleep: LCSW to evaluate and provide emotional support             -antipsychotic agents: n/a   5. Neuropsych/cognition: This patient is capable of making decisions on his own behalf.   6. Skin/Wound Care: Routine skin care checks             -remove chest tube sutures on 7/12             -routine sternotomy incision care   7. Fluids/Electrolytes/Nutrition: Routine Is and Os and follow-up chemistries   8: Hypertension: monitor TID and prn             -continue Lopressor 12.5 mg Bid   9: Hyperlipidemia: continue statin   11: severe aortic stenosis s/p AVR and aortic root replacement 7/01             -sternal precautions, coumadin             -follow-up with Dr. Laneta Simmers -TTE in 6 weeks, SBE prophylaxis    12: postop atrial  fibrillation:rate controlled; on  Coumadin (pharmacy consult)             -follow INR -continue amiodarone 200mg  daily   13: ABLA: stable; follow-up CBC             -continue oral iron supplementation   14: azotemia: encourage oral fluids and follow-up BMP   15: GERD: continue PPI   16: ? BPH: continue Proscar 5 mg daily             -monitor voiding patterns          LOS: 2 days A FACE TO FACE EVALUATION WAS PERFORMED  Erick Colace 05/27/2023, 9:09 AM

## 2023-05-27 NOTE — Progress Notes (Signed)
Patient ID: [name], male   DOB: [birth year], 67 y.o.   MRN: [id number] Team Conference Report to Patient/Family  Team Conference discussion was reviewed with the patient and caregiver, including goals, any changes in plan of care and target discharge date.  Patient and caregiver express understanding and are in agreement.  The patient has a target discharge date of 06/04/23.  Sw met with patient and provided team conference updates. Patient not enjoying hospital food, please offer snacks. Patient agreeable with d/c to focus on endurance and activity tolerance. No additional questions or concerns.  Andria Rhein 05/27/2023, 1:40 PM

## 2023-05-27 NOTE — Progress Notes (Signed)
Occupational Therapy Session Note  Patient Details  Name: [name] MRN: [id number] Date of Birth: Jan 27, 1956  Today's Date: 05/27/2023 OT Individual Time: 1415-1530 OT Individual Time Calculation (min): 75 min    Short Term Goals: Week 1:  OT Short Term Goal 1 (Week 1): STG=LTG d/t ELOS  Skilled Therapeutic Interventions/Progress Updates:     Pt received semi-reclined in bed with RN present in room providing medications. Pt presenting to be in good spirits receptive to skilled OT session reporting 0/10 pain- OT offering intermittent rest breaks, repositioning, and therapeutic support to optimize participation in therapy session. Focus this session education on resting hand splint, RUE NMR, HEP education, activity tolerance, and functional mobility training.   Beginning of session focused on planning for community outing with education provided on expectations and considerations. Pt receptive to completing community outing at end of the week for community re-integration focused session to provide opportunity to practice functional mobility, RUE functional use, and cognitive skills within community setting to increase Pt's confidence and independence upon d/c.  Pt inquiring about resting hand splint in room. Educated Pt on purpose of resting hand splint to maintain ROM in RUE and prevent onset of contractures. Also educated on performing regular skin checks for skin breakdown prevention. Provided demonstration of how to don/doff and Pt able to demonstrate teach back as evidence of learning don/doffing with min verbal cues.   Pt completed functional mobility to therapy gym without AD min HHA. Pt noted to have decreased step height on RLE, decreased weight shifting, and decreased arm swing with downward gaze. Pt   Educated pt on therputty exercises to complete between therapy sessions for HEP with handout, demonstration, and opportunity for practice provided for each exercise. Pt completed  the following exercises using yellow (light resistance) there putty to increase overall strength and motor control of RUE for improved functional use during BADLs: -Individual pinches (digit opposition) -lateral pinch - Gross grasp squeezes -Digit isolation presses -Digit adduction  Engaged Pt in seated game of connect four to work on problem solving, attention, and FM coordination. Facilitated maintained pinch and finger tip to palm translation throughout task with Pt able to complete with min dropping. Pt noted to utilize compensatory pinch pattern similar to lateral pinch vs pad to pad pinch pattern requiring mod verbal cues to correct.  Pt completed functional mobility back to room with similar presentation to prior trial. Pt with single moderate LOB during functional mobility requiring mod A to correct. Pt returned to bed with CGA and min verbal cues to maintain sternal precautions.  Pt was left resting in bed with call bell in reach, bed alarm on, and all needs met.    Therapy Documentation Precautions:  Precautions Precautions: Fall, Sternal Precaution Comments: sternal precautions Restrictions Weight Bearing Restrictions: No RUE Weight Bearing: Non weight bearing LUE Weight Bearing: Non weight bearing Other Position/Activity Restrictions: sternal precautions  Therapy/Group: Individual Therapy  Army Fossa 05/27/2023, 2:56 PM

## 2023-05-27 NOTE — Progress Notes (Signed)
Occupational Therapy Session Note  Patient Details  Name: [name] MRN: [id number] Date of Birth: [birth year]  Today's Date: 05/27/2023 OT Individual Time: 1110-1205 OT Individual Time Calculation (min): 55 min    Short Term Goals: Week 1:  OT Short Term Goal 1 (Week 1): STG=LTG d/t ELOS  Skilled Therapeutic Interventions/Progress Updates:  Pt greeted seated in recliner, pt agreeable to OT intervention. Session focus on BADL reeducation, functional mobility, dynamic standing balance, RUE coordination/motor planning/NMR and decreasing overall caregiver burden.      Pt reports fatigue from earlier session, education provided on general energy conservation tasks for home, pt reports he had already been using ECS as needed. Pt completed functional mobility with RW to sink for standing oral care. Pt able to use RUE to manipulate toothbrush/paste as needed with CGA for balance. Pt completed ambulatory transfer to bathroom with RW and CGA, pt with continent urine void needing MIN A for 3/3 toileting tasks needed assist for clothing mgmt d/t loose pants.   Pt transported to gym for time mgmt as well as fatigue. Pt completed various therapeutic activities to facilitate improved strength in R hand. Pt able to manipulate weighted clothespin to clasp to rim of cup at all levels with increased time. Pt also able to use hand exerciser to grasp squigz to facilitate improved [facility] and improved hand strength. Graded task up and had pt  stand to place/remove squigz on mirror with an emphasis on functional grasp in RUE, pt able to stand with no UE support and no LOB.    Box and Blocks Test measures unilateral gross manual dexterity. - Instructions The pt was instructed to carry one block over at a time and go as quickly as they could, making sure their fingertips crossed the partition. One minute was given to complete the task per UE. The pt was allowed a 15-second trial period prior to testing if  needed. - Results The pt transferred 29 blocks with the R hand and 34 with the L hand. The total number of blocks carried from one compartment to the other in one minute is scored per hand. Higher scores on the test indicate better gross manual dexterity.  - Norms for adults males 50-75+ -50-54 R 79 L 77.0 -55-59 R 75.2 L 73.8 -60-64 R 71.3 L 70.5 -65-69 R 68.5 L 67.4 -70-74 R 66.3 L 64.33 -75+ R 63.0 L 61.3   Ended session with pt seated in recliner with all needs within reach.              Therapy Documentation Precautions:  Precautions Precautions: Fall, Sternal Precaution Comments: sternal precautions Restrictions Weight Bearing Restrictions: No RUE Weight Bearing: Non weight bearing LUE Weight Bearing: Non weight bearing Other Position/Activity Restrictions: sternal precautions    Pain: No pain    Therapy/Group: Individual Therapy  Barron Schmid 05/27/2023, 12:12 PM

## 2023-05-28 LAB — PROTIME-INR
INR: 2.5 — ABNORMAL HIGH (ref 0.8–1.2)
Prothrombin Time: 27.6 seconds — ABNORMAL HIGH (ref 11.4–15.2)

## 2023-05-28 MED ORDER — WARFARIN SODIUM 2 MG PO TABS
2.0000 mg | ORAL_TABLET | Freq: Once | ORAL | Status: AC
  Administered 2023-05-28: 2 mg via ORAL
  Filled 2023-05-28: qty 1

## 2023-05-28 MED FILL — Heparin Sodium (Porcine) Inj 1000 Unit/ML: INTRAMUSCULAR | Qty: 10 | Status: AC

## 2023-05-28 MED FILL — Sodium Chloride IV Soln 0.9%: INTRAVENOUS | Qty: 2000 | Status: AC

## 2023-05-28 MED FILL — Lidocaine HCl Local Preservative Free (PF) Inj 2%: INTRAMUSCULAR | Qty: 14 | Status: AC

## 2023-05-28 MED FILL — Sodium Bicarbonate IV Soln 8.4%: INTRAVENOUS | Qty: 100 | Status: AC

## 2023-05-28 MED FILL — Electrolyte-R (PH 7.4) Solution: INTRAVENOUS | Qty: 4000 | Status: AC

## 2023-05-28 MED FILL — Mannitol IV Soln 20%: INTRAVENOUS | Qty: 500 | Status: AC

## 2023-05-28 NOTE — Progress Notes (Signed)
Physical Therapy Session Note  Patient Details  Name: [name] MRN: [id number] Date of Birth: 04/22/1956  Today's Date: 05/28/2023 PT Individual Time: 4098-1191 PT Individual Time Calculation (min): 57 min   Short Term Goals: Week 1:  PT Short Term Goal 1 (Week 1): = to LTGs based on ELOS  Skilled Therapeutic Interventions/Progress Updates:    Pt presents in room in recliner, agreeable to PT Pt denies pain but reports muscle burn in RLE that he states has improved over the past few days. Session focused on tolerance to activity and LE strengthening from seated position secondary to pt reports of fatigue from previous session. Pt maintains sternal precautions throughout session and completes sit<>stand transfers with CGA.  Pt ambulates 135' from room to day room initially without device or HHA but pt demonstrating poor attention to R side and attempting to use RUE for stability on handrail on wall and doors so therapist provides RUE HHA, min assist for stability with decreased RLE foot clearance. Pt transitions to sitting on nustep.  Pt completes interval training on nustep BLEs only work/50min rest, workload 3, for total 10 minutes. Completed to promote BLE mobility, activity tolerance, and muscle fiber recruitment needed for functional ambulation and transfers. Pt reports reduced stiffness and improved mobility following exercise.  Pt then completes stand pivot transfer to armless chair and completes seated therex for BLE strengthening and mobility needed for functional transfers and ambulation. Pt provided with frequent rest breaks and therapist provides therapeutic use of self due to pt expressing sadness over condition and loss of family members. Exercises included: - Seated marching x30 alternating BLE (RLE decreased mobility with fatigue) - seated LAQs x20 alternating BLE (verbal cues for desired muscular activation) - seated hip abduction x20 alternating BLE - seated toe  raises x30 - seated modified sit up short sitting x10  Pt reports having ambulated prior to admission with [facility] and expressing desire to trial. Pt ambulates with SPC in RUE 140' CGA/min assist, improved RLE foot clearance noted. Pt with no extraneous weightbearing on cane with UE and maintains sternal precautions, uses cane as balance point only. Pt returned to room and remains seated in recliner. Therapist provides printed HEP with above exercises with education on completing outside therapy sessions to decrease stiffness and improve mobility in RLE with pt verbalizing understanding.  Pt remains seated with all needs within reach, call light in place at end of session.  Therapy Documentation Precautions:  Precautions Precautions: Fall, Sternal Precaution Comments: sternal precautions Restrictions Weight Bearing Restrictions: Yes RUE Weight Bearing: Non weight bearing LUE Weight Bearing: Non weight bearing Other Position/Activity Restrictions: sternal precautions   Therapy/Group: Individual Therapy  Edwin Cap PT, DPT 05/28/2023, 12:26 PM

## 2023-05-28 NOTE — Progress Notes (Signed)
Physical Therapy Session Note  Patient Details  Name: [name] MRN: [id number] Date of Birth: 01-05-1956  Today's Date: 05/28/2023 PT Individual Time: 0800-0900; 100-215 PT Individual Time Calculation (min): 60 min ;75 min  Short Term Goals: Week 1:  PT Short Term Goal 1 (Week 1): = to LTGs based on ELOS  Skilled Therapeutic Interventions/Progress Updates:   Session 1:  Pt was received in room sitting in recliner. Pt agreeable to PT tx. No complaints of pain. Patient states he needs to go to the bathroom prior to beginning exercise. Chair alarm not on.  Therapeutic Activity: Gait to the bathroom without AD approx 12 ft with PT CGA. Sitting balance on toilet with PT distant S good. STS from toilet abiding by sternal precautions S. Clothing management MI. Toileting hygiene secondary to having a bowel movement with PT dependency due to pt stating he was told he couldn't reach behind him. During therex in the gym, MD assessed patient and stated pt could reach behind him.   Therex: Forward/backwards walks x 18 ft in gym with concentration on R LE quality control. STS from mat table with focus on eccentric control without UE A with PT light CGA x 10; standing narrow base of support stacking and restacking cones 2x1 min each for dynamic strengthening; side steps L/R x 25 ft each with PT CGA; seated hip flexion R LE with focus on decreasing compensatory movements; R 2lb forearm pronation/supination in sitting; R 2 lb bicep curls x 15; no weight front raise/lateral raise/combo front/lateral raise x 20 each standing; Low squat over mat table x 10; seated R hip flexion/abduction/adduction combo x 20. Gait x 68 ft without AD with PT light CGA with pt with good quality control and foot clearance R LE. Sternal precautions maintained for all tx.  Pt left in room in recliner chair; no pain; all needs within reach.   Session 2: Pt was received in room in recliner finishing lunch. Pt agreeable to tx.  Reports fatigue but no pain.  Gait Training: Pt states that prior therapist gave him a cane to try to be used in his right hand. GT with cane as indicated above with PT CGA for safety x 172 ft x 2 with concentration on R knee flexion and proper sequencing of cane which pt was able to perform.  Therex: Seated R knee flexion AROM with basketball to facilitate further knee flexion while maintaining knee in neutral; R hip ext iso with foot on basketball x 20 with 2-3 sec iso hold. Supine therex: R heel slide x 20 with focus on maintaining R knee in neutral; R hip abduction/adduction SLR with focus on adduction due to hip stiffness; bridge x 20; SLR performed each LE x 20. Sidelying hip abduction with knee extended and then flexed x 20 each each LE; bridge w ith march x 20; iso hip adduction x 5.5 min while talking with rec therapist to work on adductor strength; R unilateral bridge x 20; unilat and bil SAQ x 20.  Pt left in chair with all needs within reach. Reports of fatigue from therapies today. No complaints of pain. All therapy focused on quality of movement especially of R LE. Sit to supine transfer MI; Supine to sit transfer min A from mat table. All activities abided by sternal precautions.     Therapy Documentation Precautions:  Precautions Precautions: Fall, Sternal Precaution Comments: sternal precautions Restrictions Weight Bearing Restrictions: No RUE Weight Bearing: Non weight bearing (because of sternal precautions) LUE  Weight Bearing: Non weight bearing (because of sternal precautions) Other Position/Activity Restrictions: sternal precautions        Therapy/Group: Individual Therapy  Luna Fuse 05/28/2023, 7:11 AM

## 2023-05-28 NOTE — Plan of Care (Signed)
  Problem: RH SAFETY Goal: RH STG ADHERE TO SAFETY PRECAUTIONS W/ASSISTANCE/DEVICE Description: STG Adhere to Safety Precautions With cues Assistance/Device. Outcome: Progressing   Problem: RH KNOWLEDGE DEFICIT Goal: RH STG INCREASE KNOWLEDGE OF HYPERTENSION Description: Patient and wife will be able to manage HTN with medications and dietary modifications using educational resources independently Outcome: Progressing Goal: RH STG INCREASE KNOWLEGDE OF HYPERLIPIDEMIA Description: Patient and wife will be able to manage HLD with medications and dietary modifications using educational resources independently Outcome: Progressing Goal: RH STG INCREASE KNOWLEDGE OF STROKE PROPHYLAXIS Description: Patient and wife will be able to manage a-fib and secondary risks with medications and dietary modifications using educational resources independently Outcome: Progressing

## 2023-05-28 NOTE — Progress Notes (Signed)
ANTICOAGULATION CONSULT NOTE - Follow Up Consult  Pharmacy Consult for Warfarin Indication:  CVA  Allergies  Allergen Reactions   Alcohol Other (See Comments)    Pts skin gets red. Ex. Pt used alcohol based deodorant and got red things under arms. & Drinks alcohol notices "from heart on up" everything turns red.   Chlorthalidone     Dizziness and Syncopal Episode    Patient Measurements: Height: 5\' 8"  (172.7 cm) Weight: 76 kg (167 lb 8.8 oz) IBW/kg (Calculated) : 68.4  Vital Signs: Temp: 98.1 F (36.7 C) (07/11 0352) Temp Source: Oral (07/11 0352) BP: 139/85 (07/11 0352) Pulse Rate: 82 (07/11 0352)  Labs: Recent Labs    05/26/23 0544 05/27/23 0744 05/28/23 0745  HGB 8.5*  --   --   HCT 26.8*  --   --   PLT 247  --   --   LABPROT 25.7* 27.8* 27.6*  INR 2.3* 2.6* 2.5*  CREATININE 0.93  --   --     Estimated Creatinine Clearance: 74.6 mL/min (by C-G formula based on SCr of 0.93 mg/dL).  Assessment: [name] who was initiated on warfarin by TCTS MD on 05/22/23 during inpatient admission and managed by TCTS MD. Once transferred to rehab, confirmed with TCTS that pharmacy will now be managing warfarin.   While inpatient, patient received 5 mg dose on 7/5, 5 mg dose on 7/6, and 2.5 mg dose on 7/7. INR increased from 2.6 to 3.6 on 7/8, which resulted in holding warfarin dose on 7/8. Warfarin resumed on 7/10 when INR 2.3, therapeutic.  Has had 2.5 mg then 1.25 mg the last 2 days.  INR remains therapeutic at 2.5 today.   Of note, on amiodarone, which can increase sensitivity to warfarin; will monitor closely.   Goal of Therapy:  INR 2-3 Monitor platelets by anticoagulation protocol: Yes   Plan:  Warfarin 2 mg x 1 today. Daily PT/INR. CBC in am and intermittently.  Monitor for signs/symptoms of bleeding.   Dennie Fetters, [place] 05/28/2023,12:47 PM

## 2023-05-28 NOTE — Evaluation (Signed)
Recreational Therapy Assessment and Plan  Patient Details  Name: [name] MRN: [id number] Date of Birth: 1956-04-18 Today's Date: 05/28/2023  Rehab Potential:  Good ELOS:   d/c 7/18  Assessment Hospital Problem: Principal Problem:   CVA (cerebral vascular accident) [facility])     Past Medical History:      Past Medical History:  Diagnosis Date   Arthritis     Coronary atherosclerosis of native coronary artery      a. NSTEMI (02/2014):  LHC (02/2014):  Mild disease in LAD and CFX; prox RCA occluded with R-R collats, dist RCA filled by L-R collats, inf HK, EF 55%, LVEDP 15 mmHg.  PCI:  Unsuccessful angioplasty of RCA (late presentation of inf MI) - tx medically.   Essential hypertension     GERD (gastroesophageal reflux disease)     Hyperlipidemia     NSTEMI (non-ST elevated myocardial infarction) (HCC) 03/11/14   Renal cell carcinoma of right kidney (HCC)      Partial nephrectomy in 2013        Past Surgical History:       Past Surgical History:  Procedure Laterality Date   AORTIC VALVE REPLACEMENT N/A 05/18/2023    Procedure: AORTIC VALVE REPLACEMENT (AVR) USING INSPIRIS RESILIA 23 MM AORTIC VALVE;  Surgeon: Alleen Borne, MD;  Location: [facility] OR;  Service: Open Heart Surgery;  Laterality: N/A;   BACK SURGERY        1989   CERVICAL DISC SURGERY   04/29/2021   COLONOSCOPY N/A 12/11/2020    Procedure: COLONOSCOPY;  Surgeon: Franky Macho, MD;  Location: [facility] ENDO SUITE;  Service: Gastroenterology;  Laterality: N/A;   LEFT HEART CATHETERIZATION WITH CORONARY ANGIOGRAM N/A 03/17/2014    Procedure: LEFT HEART CATHETERIZATION WITH CORONARY ANGIOGRAM;  Surgeon: Corky Crafts, MD;  Location: [facility] CATH LAB;  Service: Cardiovascular;  Laterality: N/A;   POLYPECTOMY   12/11/2020    Procedure: POLYPECTOMY;  Surgeon: Franky Macho, MD;  Location: [facility] ENDO SUITE;  Service: Gastroenterology;;   REPLACEMENT ASCENDING AORTA N/A 05/18/2023    Procedure: REPLACEMENT OF ASCENDING AORTIC ANEURYSM  USING A 30 X 10 MM HEMASHIELD PLATINUM GRAFT;  Surgeon: Alleen Borne, MD;  Location: [facility] OR;  Service: Open Heart Surgery;  Laterality: N/A;  CIRC ARREST   RIGHT HEART CATH AND CORONARY ANGIOGRAPHY N/A 04/03/2023    Procedure: RIGHT HEART CATH AND CORONARY ANGIOGRAPHY;  Surgeon: Tonny Bollman, MD;  Location: [facility] INVASIVE CV LAB;  Service: Cardiovascular;  Laterality: N/A;   ROBOT ASSISTED LAPAROSCOPIC NEPHRECTOMY   01/14/2012    Procedure: ROBOTIC ASSISTED LAPAROSCOPIC NEPHRECTOMY;  Surgeon: Milford Cage, MD;  Location: [facility] ORS;  Service: Urology;  Laterality: Right;  Robot Laparoscopic Right Partial Nephrectomy       TEE WITHOUT CARDIOVERSION N/A 05/18/2023    Procedure: TRANSESOPHAGEAL ECHOCARDIOGRAM;  Surgeon: Alleen Borne, MD;  Location: [facility] OR;  Service: Open Heart Surgery;  Laterality: N/A;   TONSILLECTOMY              Assessment & Plan Clinical Impression: [name] is a 67 year old male who presented to the [facility] for AVR and ascending aortic aneurysm repair on 05/18/2023. He had evidence of bicuspid aortic valve stenosis with 5.1 cm ascending aortic aneurysm and underwent repair by Dr. Laneta Simmers on 05/18/2023. He was extubated later that day.  He had brief episode of hematuria after difficult Foley placement.  Remains on Proscar for urinary retention.  Intraoperative TEE with EF approximately 50%.  Pacer  wires and chest tubes removed on 7/03.  Chest x-ray was stable and he was transferred out to stepdown unit.  Acute blood loss anemia treated with oral iron replacement.  Aspirin dosing decreased to 81 mg due to post-operative thrombocytopenia.  Later in the evening on 7/03 he was noted to have right hand weakness and rapid response was called.  He was noted to have atrial fibrillation with RVR and was hypotensive.  He was started on an amiodarone infusion and cardiology consulted.  He was transition to amiodarone 200 mg twice daily on 7/04.  CT of head without evidence of acute infarct  or hemorrhage, however brain MRI showed scattered small acute infarcts in the left cerebellar hemisphere in both cerebral hemispheres likely embolic in etiology.  Neurology was consulted.  He was felt to be a candidate for Coumadin due to the recent new aortic valve as well as postoperative A-fib.  No large vessel occlusion noted.  INR goal is 2.0.  BLE venous duplex negative for DVT on 7/06. Hemoglobin is stable and platelet count is now normal. His incision is healing without signs of infection and he is tolerating his diet. The patient requires inpatient medicine and rehabilitation evaluations and services for ongoing dysfunction secondary to acute CVA, Severe AS, aorotic aneurysm s/p repair.   Past medical history includes for lipidemia, coronary artery disease status post NSTEMI in 2015 with catheterization showing an occluded proximal RCA with right to right collaterals filling the distal vessel that cannot be opened, renal cell carcinoma status post partial right nephrectomy in 2013, severe degenerative spine disease.  Previous lower back surgery in the 1980s and a cervical disc surgery in 2022.Patient transferred to [name] on 05/25/2023 .     Pt presents with decreased activity tolerance, decreased functional mobility, decreased balance, decreased coordination, feelings of stress Limiting pt's independence with leisure/community pursuits. Met with pt today to discuss TR services with an emphasis on community reintegration.  Discussed the purpose of an outing & potential goals.  Pt agreeable to participate in an outing tomorrow. Plan  Min1 Time >60 minutes for community reintegration.  Recommendations for other services: None   Discharge Criteria: Patient will be discharged from TR if patient refuses treatment 3 consecutive times without medical reason.  If treatment goals not met, if there is a change in medical status, if patient makes no progress towards goals or if patient is discharged from  hospital.  The above assessment, treatment plan, treatment alternatives and goals were discussed and mutually agreed upon: by patient  Gabriela Giannelli 05/28/2023, 3:40 PM

## 2023-05-28 NOTE — Progress Notes (Signed)
TCTS   Subjective:  He feels well. Doing well with therapy. Upbeat about progress.  Objective: Vital signs in last 24 hours: Temp:  [98.1 F (36.7 C)-99.2 F (37.3 C)] 98.3 F (36.8 C) (07/11 1420) Pulse Rate:  [73-82] 73 (07/11 1420) Resp:  [16] 16 (07/11 1420) BP: (109-139)/(75-85) 112/75 (07/11 1420) SpO2:  [97 %-100 %] 100 % (07/11 1420) Weight:  [76 kg] 76 kg (07/11 0600)  Hemodynamic parameters for last 24 hours:    Intake/Output from previous day: 07/10 0701 - 07/11 0700 In: 720 [P.O.:720] Out: -  Intake/Output this shift: Total I/O In: 236 [P.O.:236] Out: -   General appearance: alert and cooperative Heart: regular rate and rhythm Lungs: clear to auscultation bilaterally Wound: incision healing well.  Lab Results: Recent Labs    05/26/23 0544  WBC 8.8  HGB 8.5*  HCT 26.8*  PLT 247   BMET:  Recent Labs    05/26/23 0544  NA 137  K 4.3  CL 105  CO2 22  GLUCOSE 104*  BUN 29*  CREATININE 0.93  CALCIUM 8.5*    PT/INR:  Recent Labs    05/28/23 0745  LABPROT 27.6*  INR 2.5*   ABG    Component Value Date/Time   PHART 7.371 05/18/2023 2030   HCO3 16.6 (L) 05/18/2023 2030   TCO2 17 (L) 05/18/2023 2030   ACIDBASEDEF 8.0 (H) 05/18/2023 2030   O2SAT 99 05/18/2023 2030   CBG (last 3)  No results for input(s): "GLUCAP" in the last 72 hours.  Assessment/Plan:  He continues to progress in [name]. Stable from a medical standpoint with therapeutic INR of 2.5.    LOS: 3 days    [name] 05/28/2023

## 2023-05-28 NOTE — Progress Notes (Signed)
Uneventful night. Tolerated PRAFO boot most of night, but removed Right WHO. Continent B&B.

## 2023-05-28 NOTE — Progress Notes (Signed)
PROGRESS NOTE   Subjective/Complaints:   No issues overnight.  Patient participating well in physical therapy.  Discussed discharge date Review of systems negative chest pain shortness of breath nausea vomiting diarrhea  Objective:   No results found. Recent Labs    05/26/23 0544  WBC 8.8  HGB 8.5*  HCT 26.8*  PLT 247   Recent Labs    05/26/23 0544  NA 137  K 4.3  CL 105  CO2 22  GLUCOSE 104*  BUN 29*  CREATININE 0.93  CALCIUM 8.5*    Intake/Output Summary (Last 24 hours) at 05/28/2023 1610 Last data filed at 05/27/2023 2300 Gross per 24 hour  Intake 720 ml  Output --  Net 720 ml        Physical Exam: Vital Signs Blood pressure 139/85, pulse 82, temperature 98.1 F (36.7 C), temperature source Oral, resp. rate 16, height 5\' 8"  (1.727 m), weight 76 kg, SpO2 100%.   General: No acute distress Mood and affect are appropriate Heart: Regular rate and rhythm no rubs murmurs or extra sounds Lungs: Clear to auscultation, breathing unlabored, no rales or wheezes Abdomen: Positive bowel sounds, soft nontender to palpation, nondistended Extremities: No clubbing, cyanosis, or edema Skin: No evidence of breakdown, no evidence of rash Neurologic: Cranial nerves II through XII intact, motor strength is 5/5 in left deltoid, bicep, tricep, grip, hip flexor, knee extensors, ankle dorsiflexor and plantar flexor 3/5 strength in the right deltoid, bicep, tricep, grip, 4/5 strength in the right hip flexor knee extensor ankle dorsiflexor and plantar flexor Unchanged  Sensory exam normal sensation to light touch  in bilateral upper and lower extremities Finger to thumb opposition is reduced he can oppose his index and middle finger but not the ring and little finger.  The patient has very slow opposition  Musculoskeletal: Full range of motion in all 4 extremities. No joint swelling   Assessment/Plan: 1. Functional  deficits which require 3+ hours per day of interdisciplinary therapy in a comprehensive inpatient rehab setting. Physiatrist is providing close team supervision and 24 hour management of active medical problems listed below. Physiatrist and rehab team continue to assess barriers to discharge/monitor patient progress toward functional and medical goals  Care Tool:  Bathing    Body parts bathed by patient: Right arm, Left arm, Chest, Abdomen, Front perineal area, Right upper leg, Left upper leg, Face   Body parts bathed by helper: Buttocks, Right lower leg, Left lower leg     Bathing assist Assist Level: Minimal Assistance - Patient > 75%     Upper Body Dressing/Undressing Upper body dressing   What is the patient wearing?: Pull over shirt    Upper body assist Assist Level: Minimal Assistance - Patient > 75%    Lower Body Dressing/Undressing Lower body dressing      What is the patient wearing?: Pants     Lower body assist Assist for lower body dressing: Moderate Assistance - Patient 50 - 74%     Toileting Toileting    Toileting assist Assist for toileting: Moderate Assistance - Patient 50 - 74% Assistive Device Comment: RW   Transfers Chair/bed transfer  Transfers assist  Chair/bed transfer assist level: Minimal Assistance - Patient > 75%     Locomotion Ambulation   Ambulation assist      Assist level: Minimal Assistance - Patient > 75% Assistive device: Hand held assist Max distance: 363ft   Walk 10 feet activity   Assist     Assist level: Minimal Assistance - Patient > 75% Assistive device: Hand held assist   Walk 50 feet activity   Assist    Assist level: Minimal Assistance - Patient > 75% Assistive device: Hand held assist    Walk 150 feet activity   Assist    Assist level: Minimal Assistance - Patient > 75% Assistive device: Hand held assist    Walk 10 feet on uneven surface  activity   Assist     Assist level:  Minimal Assistance - Patient > 75% Assistive device: Hand held assist   Wheelchair     Assist Is the patient using a wheelchair?: No             Wheelchair 50 feet with 2 turns activity    Assist            Wheelchair 150 feet activity     Assist          Blood pressure 139/85, pulse 82, temperature 98.1 F (36.7 C), temperature source Oral, resp. rate 16, height 5\' 8"  (1.727 m), weight 76 kg, SpO2 100%.  Medical Problem List and Plan: 1. 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  Primarily has right upper extremity weakness and decreased balance             -patient may shower             -ELOS/Goals: 7/18 mod I goals with PT and OT   2.  Antithrombotics: -DVT/anticoagulation:  Pharmaceutical: Coumadin             -antiplatelet therapy: Aspirin 81 mg indefinitely   3. Pain Management. Has long hx of low back pain followed by ortho. Having intermittent radicular-type symptoms in the RLE. Also has had cervical symptoms as well -Tylenol, tramodol, oxycodone as needed             -baclofen 10 mg daily             -consider trial of gabapentin             -encouraged stretching, oob, physical activity to tolerance   4. Mood/Behavior/Sleep: LCSW to evaluate and provide emotional support             -antipsychotic agents: n/a   5. Neuropsych/cognition: This patient is capable of making decisions on his own behalf.   6. Skin/Wound Care: Routine skin care checks             -remove chest tube sutures on 7/12             -routine sternotomy incision care   7. Fluids/Electrolytes/Nutrition: Routine Is and Os and follow-up chemistries   8: Hypertension: monitor TID and prn             -continue Lopressor 12.5 mg Bid Vitals:   05/27/23 1923 05/28/23 0352  BP: 109/79 139/85  Pulse: 75 82  Resp: 16 16  Temp: 99.2 F (37.3 C) 98.1 F (36.7 C)  SpO2: 97% 100%      9:  Hyperlipidemia: continue statin   11: severe aortic stenosis s/p AVR and aortic root replacement 7/01             -  sternal precautions, coumadin             -follow-up with Dr. Laneta Simmers -TTE in 6 weeks, SBE prophylaxis    12: postop atrial fibrillation:rate controlled; on Coumadin (pharmacy consult)             -follow INR -continue amiodarone 200mg  daily   13: ABLA: stable; follow-up CBC             -continue oral iron supplementation   14: azotemia: encourage oral fluids and follow-up BMP   15: GERD: continue PPI   16: ? BPH: continue Proscar 5 mg daily             -monitor voiding patterns   LOS: 3 days A FACE TO FACE EVALUATION WAS PERFORMED  Erick Colace 05/28/2023, 8:32 AM

## 2023-05-28 NOTE — Patient Care Conference (Signed)
Inpatient RehabilitationTeam Conference and Plan of Care Update Date: 05/27/2023   Time: 10:58 AM    Patient Name: [name]      Medical Record Number: [id number]  Date of Birth: [birth year] Sex: Male         Room/Bed: 4W12C/4W12C-02 Payor Info: Payor: MEDICARE / Plan: MEDICARE PART A AND B / Product Type: *No Product type* /    Admit Date/Time:  05/25/2023  4:44 PM  Primary Diagnosis:  CVA (cerebral vascular accident) [facility])  Hospital Problems: Principal Problem:   CVA (cerebral vascular accident) [facility])    Expected Discharge Date: Expected Discharge Date: 06/04/23  Team Members Present: Physician leading conference: Dr. Claudette Laws Social Worker Present: Lavera Guise, BSW Nurse Present: Chana Bode, RN PT Present: Casimiro Needle, PT OT Present: Bonnell Public, OT SLP Present: Feliberto Gottron, SLP PPS Coordinator present : Fae Pippin, SLP     Current Status/Progress Goal Weekly Team Focus  Bowel/Bladder   Contient of bowel and bladder.   Continue continence with bowel and bladder.   Assess tolieting needs QShift and as needed. Encourage patient to void Q4 to Q6.    Swallow/Nutrition/ Hydration               ADL's   min A UB BADLs, mod LB BADLs, Mod toileting, supervision grooming/hygine, toilet and shwoer transfers min A using RW; Barriers: decreased activity tolerance, difficulty maintaining precautions during BADLs, self-limiting, decreased activity tolerance   superivsion   evaluation and d/c planning, BADL retraining, Pt education, functional transfer training    Mobility   CGA/min assist bed mobility, min A sit<>stand and stand pivot transfers using R HHA, min A gait up to 365ft using R HHA, min A 12 stairs using B HRs - some baseline R LE gait deviations that are exacerbated by CVA resulting in increased risk for falls   supervision overall at ambulatory level  pt education, dynamic gait training, dynamic standing balance, R LE NMR, stair  navigation for home entry. Barriers: family support    Communication                Safety/Cognition/ Behavioral Observations               Pain   No issues with patient reporting pain. Pain reported by patient has been "0"   Continue to work towards keeping patient pain free and at "0" with pain.   Assess pain Qshift and as needed.    Skin   Midline incision open to air with betadine painting daily. Two closed suture sites bilateral to lower area of chest. No further skin issues.   Continue to assess skin daily to ensure no breakdown of skin. Continue to maintain skin integrity of incision site to avoid infections and further skin related issues.  Assess QShift and as needed.      Discharge Planning:  Discharging home with spouse and son able to supervise 24/7.   Team Discussion: Patient with right sided weakness with fine motor deficits post bilateral strokes with AVR. Drain sutures removal on 05/29/23. Limited by fatigue and poor endurance/activity tolerance.  Patient on target to meet rehab goals: yes, currently needs min assist for upper body care and mod assist for lower body care and toileting. Completes transfers and shower transfers with CGA and needs min assist for gait. Goals for discharge set for supervision overall.  *See Care Plan and progress notes for long and short-term goals.   Revisions to Treatment Plan:  Neuro psych  consult TR outing   Teaching Needs: Safety, medications, dietary modifications, transfers, etc.   Current Barriers to Discharge: Decreased caregiver support and Home enviroment access/layout  Possible Resolutions to Barriers: Family education     Medical Summary Current Status: BP fair control , on warfarin for AVR, sternal prec  Barriers to Discharge: Other (comments)  Barriers to Discharge Comments: sternal prec limiting UE use Possible Resolutions to [employer] Focus: teaching for warfarin use, teaching sternal prec and  compensatory movements   Continued Need for Acute Rehabilitation Level of Care: The patient requires daily medical management by a physician with specialized training in physical medicine and rehabilitation for the following reasons: Direction of a multidisciplinary physical rehabilitation program to maximize functional independence : Yes Medical management of patient stability for increased activity during participation in an intensive rehabilitation regime.: Yes Analysis of laboratory values and/or radiology reports with any subsequent need for medication adjustment and/or medical intervention. : Yes   I attest that I was present, lead the team conference, and concur with the assessment and plan of the team.   Chana Bode B 05/28/2023, 9:33 AM

## 2023-05-29 LAB — CBC
HCT: 29.2 % — ABNORMAL LOW (ref 39.0–52.0)
Hemoglobin: 9.2 g/dL — ABNORMAL LOW (ref 13.0–17.0)
MCH: 30.2 pg (ref 26.0–34.0)
MCHC: 31.5 g/dL (ref 30.0–36.0)
MCV: 95.7 fL (ref 80.0–100.0)
Platelets: 278 10*3/uL (ref 150–400)
RBC: 3.05 MIL/uL — ABNORMAL LOW (ref 4.22–5.81)
RDW: 16.4 % — ABNORMAL HIGH (ref 11.5–15.5)
WBC: 9.2 10*3/uL (ref 4.0–10.5)
nRBC: 0 % (ref 0.0–0.2)

## 2023-05-29 LAB — PROTIME-INR
INR: 2.4 — ABNORMAL HIGH (ref 0.8–1.2)
Prothrombin Time: 26.5 seconds — ABNORMAL HIGH (ref 11.4–15.2)

## 2023-05-29 MED ORDER — WARFARIN SODIUM 2 MG PO TABS
2.0000 mg | ORAL_TABLET | Freq: Once | ORAL | Status: AC
  Administered 2023-05-29: 2 mg via ORAL
  Filled 2023-05-29: qty 1

## 2023-05-29 NOTE — Progress Notes (Signed)
Physical Therapy Session Note  Patient Details  Name: [name] MRN: [id number] Date of Birth: [birth year]  Today's Date: 05/29/2023 PT Concurrent Time: 0900-1015 PT Concurrent Time Calculation (min): 75 min  Short Term Goals: Week 1:  PT Short Term Goal 1 (Week 1): = to LTGs based on ELOS  Skilled Therapeutic Interventions/Progress Updates:      Patient agreeable to PT treatment with session focus on community integration and outing to [employer].    During transportation to/from [employer], discussed purpose of outing, goals set for outing, and general DC planning. Encouraged discussion with other patient joining for socialization and therapeutic communication.    minA overall for stepping out of the [place]. Patient ambulated throughout grocery store at [facility] level while holding onto the [employer]. Pt instructed in placing certain ingredients (x5) back to their location within the grocery store, placing them from cart on the the shelves. Patient ambulated >730ft throughout grocery store at this CGA level, reaching high/low on shelves for items, and able to recall ingredients without cueing. Educated him on awareness for ingredients in chosen items, especially sodium.   Patient becoming fatigued towards the end of the shopping, adequate awareness by requesting to sit to rest.    Returned to [name] via rehab Zenaida Niece and assisted back to his room. Ended session seated in recliner with all needs met. Pt appreciative of outing with positive feedback.   Therapy Documentation Precautions:  Precautions Precautions: Fall, Sternal Precaution Comments: sternal precautions Restrictions Weight Bearing Restrictions: Yes RUE Weight Bearing: Non weight bearing LUE Weight Bearing: Non weight bearing Other Position/Activity Restrictions: sternal precautions General:     Therapy/Group: Individual Therapy  Ernestyne Caldwell P Malik Ruffino  PT, DPT, CSRS  05/29/2023, 10:46 AM

## 2023-05-29 NOTE — Progress Notes (Signed)
ANTICOAGULATION CONSULT NOTE - Follow Up Consult  Pharmacy Consult for Warfarin Indication:  CVA  Allergies  Allergen Reactions   Alcohol Other (See Comments)    Pts skin gets red. Ex. Pt used alcohol based deodorant and got red things under arms. & Drinks alcohol notices "from heart on up" everything turns red.   Chlorthalidone     Dizziness and Syncopal Episode    Patient Measurements: Height: 5\' 8"  (172.7 cm) Weight: 73 kg (160 lb 15 oz) IBW/kg (Calculated) : 68.4  Vital Signs: Temp: 98.2 F (36.8 C) (07/12 0543) BP: 117/81 (07/12 0645) Pulse Rate: 79 (07/12 0645)  Labs: Recent Labs    05/27/23 0744 05/28/23 0745 05/29/23 0605  HGB  --   --  9.2*  HCT  --   --  29.2*  PLT  --   --  278  LABPROT 27.8* 27.6* 26.5*  INR 2.6* 2.5* 2.4*    Estimated Creatinine Clearance: 74.6 mL/min (by C-G formula based on SCr of 0.93 mg/dL).  Assessment: [name] who was initiated on warfarin by TCTS MD on 05/22/23 during inpatient admission and managed by TCTS MD. Once transferred to rehab, confirmed with TCTS that pharmacy will now be managing warfarin.   While inpatient, patient received 5 mg dose on 7/5, 5 mg dose on 7/6, and 2.5 mg dose on 7/7. INR increased from 2.6 to 3.6 on 7/8, which resulted in holding warfarin dose on 7/8. Warfarin resumed on 7/10 when INR 2.3, therapeutic. Varying doses the last few days, 2.5, 1.25, then 2 mg, INR remains therapeutic at 2.4.  CBC stable, Hgb improved.   Of note, on amiodarone, which can increase sensitivity to warfarin; will monitor closely.   Goal of Therapy:  INR 2-3 Monitor platelets by anticoagulation protocol: Yes   Plan:  Warfarin 2 mg x 1 again today. Daily PT/INR. CBC intermittently.  Monitor for signs/symptoms of bleeding.   Dennie Fetters, RPh 05/29/2023,11:23 AM

## 2023-05-29 NOTE — Progress Notes (Addendum)
PROGRESS NOTE   Subjective/Complaints:  No issues overnite , discussed deficits and focus on both gross motr and fine motor recovery RUE Has chronic low back issues with chronic RLE sciatic sx but fairly well controlled   Review of systems negative chest pain shortness of breath nausea vomiting diarrhea  Objective:   No results found. Recent Labs    05/29/23 0605  WBC 9.2  HGB 9.2*  HCT 29.2*  PLT 278   No results for input(s): "NA", "K", "CL", "CO2", "GLUCOSE", "BUN", "CREATININE", "CALCIUM" in the last 72 hours.   Intake/Output Summary (Last 24 hours) at 05/29/2023 0832 Last data filed at 05/28/2023 1833 Gross per 24 hour  Intake 354 ml  Output --  Net 354 ml        Physical Exam: Vital Signs Blood pressure 117/81, pulse 79, temperature 98.2 F (36.8 C), resp. rate 17, height 5\' 8"  (1.727 m), weight 73 kg, SpO2 100%.   General: No acute distress Mood and affect are appropriate Heart: Regular rate and rhythm no rubs murmurs or extra sounds Lungs: Clear to auscultation, breathing unlabored, no rales or wheezes Abdomen: Positive bowel sounds, soft nontender to palpation, nondistended Extremities: No clubbing, cyanosis, or edema Skin: No evidence of breakdown, no evidence of rash Neurologic: Cranial nerves II through XII intact, motor strength is 5/5 in left deltoid, bicep, tricep, grip, hip flexor, knee extensors, ankle dorsiflexor and plantar flexor 3/5 strength in the right deltoid, bicep, tricep, grip, 4/5 strength in the right hip flexor knee extensor ankle dorsiflexor and plantar flexor Unchanged  Sensory exam normal sensation to light touch  in bilateral upper and lower extremities Finger to thumb opposition is reduced he can oppose his index and middle finger but not the ring and little finger.  The patient has very slow opposition  Musculoskeletal: Full range of motion in all 4 extremities. No joint  swelling   Assessment/Plan: 1. Functional deficits which require 3+ hours per day of interdisciplinary therapy in a comprehensive inpatient rehab setting. Physiatrist is providing close team supervision and 24 hour management of active medical problems listed below. Physiatrist and rehab team continue to assess barriers to discharge/monitor patient progress toward functional and medical goals  Care Tool:  Bathing    Body parts bathed by patient: Right arm, Left arm, Chest, Abdomen, Front perineal area, Right upper leg, Left upper leg, Face   Body parts bathed by helper: Buttocks, Right lower leg, Left lower leg     Bathing assist Assist Level: Minimal Assistance - Patient > 75%     Upper Body Dressing/Undressing Upper body dressing   What is the patient wearing?: Pull over shirt    Upper body assist Assist Level: Minimal Assistance - Patient > 75%    Lower Body Dressing/Undressing Lower body dressing      What is the patient wearing?: Pants     Lower body assist Assist for lower body dressing: Moderate Assistance - Patient 50 - 74%     Toileting Toileting    Toileting assist Assist for toileting: Moderate Assistance - Patient 50 - 74% Assistive Device Comment: RW   Transfers Chair/bed transfer  Transfers assist  Chair/bed transfer assist level: Minimal Assistance - Patient > 75%     Locomotion Ambulation   Ambulation assist      Assist level: Minimal Assistance - Patient > 75% Assistive device: Hand held assist Max distance: 363ft   Walk 10 feet activity   Assist     Assist level: Minimal Assistance - Patient > 75% Assistive device: Hand held assist   Walk 50 feet activity   Assist    Assist level: Minimal Assistance - Patient > 75% Assistive device: Hand held assist    Walk 150 feet activity   Assist    Assist level: Minimal Assistance - Patient > 75% Assistive device: Hand held assist    Walk 10 feet on uneven surface   activity   Assist     Assist level: Minimal Assistance - Patient > 75% Assistive device: Hand held assist   Wheelchair     Assist Is the patient using a wheelchair?: No             Wheelchair 50 feet with 2 turns activity    Assist            Wheelchair 150 feet activity     Assist          Blood pressure 117/81, pulse 79, temperature 98.2 F (36.8 C), resp. rate 17, height 5\' 8"  (1.727 m), weight 73 kg, SpO2 100%.  Medical Problem List and Plan: 1. 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  Primarily has right upper extremity weakness and decreased balance             -patient may shower             -ELOS/Goals: 7/18 mod I goals with PT and OT   2.  Antithrombotics: -DVT/anticoagulation:  Pharmaceutical: Coumadin             -antiplatelet therapy: Aspirin 81 mg indefinitely   3. Pain Management. Has long hx of low back pain followed by ortho. Having intermittent radicular-type symptoms in the RLE. Also has had cervical symptoms as well We discussed no elective surgeries recommended for 6 mo post CVA -Tylenol, tramodol, oxycodone as needed             -baclofen 10 mg daily             -consider trial of gabapentin             -encouraged stretching, oob, physical activity to tolerance   4. Mood/Behavior/Sleep: LCSW to evaluate and provide emotional support             -antipsychotic agents: n/a   5. Neuropsych/cognition: This patient is capable of making decisions on his own behalf.   6. Skin/Wound Care: Routine skin care checks             -remove chest tube sutures on 7/12             -routine sternotomy incision care   7. Fluids/Electrolytes/Nutrition: Routine Is and Os and follow-up chemistries   8: Hypertension: monitor TID and prn             -continue Lopressor 12.5 mg Bid Vitals:   05/29/23 0543 05/29/23 0645  BP:  117/81  Pulse: 92 79  Resp: 17    Temp: 98.2 F (36.8 C)   SpO2: 100%       9: Hyperlipidemia: continue statin   11: severe aortic stenosis s/p AVR and aortic  root replacement 7/01             -sternal precautions, coumadin             -follow-up with Dr. Laneta Simmers -TTE in 6 weeks, SBE prophylaxis    12: postop atrial fibrillation:rate controlled; on Coumadin (pharmacy consult)             -follow INR -continue amiodarone 200mg  daily   13: ABLA: stable; follow-up CBC             -continue oral iron supplementation Recheck Monday     Latest Ref Rng & Units 05/29/2023    6:05 AM 05/26/2023    5:44 AM 05/23/2023    8:15 AM  CBC  WBC 4.0 - 10.5 K/uL 9.2  8.8  7.0   Hemoglobin 13.0 - 17.0 g/dL 9.2  8.5  8.1   Hematocrit 39.0 - 52.0 % 29.2  26.8  25.1   Platelets 150 - 400 K/uL 278  247  175       14: azotemia: encourage oral fluids and follow-up BMP Monday       Latest Ref Rng & Units 05/26/2023    5:44 AM 05/23/2023    8:15 AM 05/21/2023    5:51 AM  BMP  Glucose 70 - 99 mg/dL 161  096  045   BUN 8 - 23 mg/dL 29  26  26    Creatinine 0.61 - 1.24 mg/dL 4.09  8.11  9.14   Sodium 135 - 145 mmol/L 137  140  135   Potassium 3.5 - 5.1 mmol/L 4.3  3.5  3.8   Chloride 98 - 111 mmol/L 105  99  104   CO2 22 - 32 mmol/L 22  25  24    Calcium 8.9 - 10.3 mg/dL 8.5  8.7  8.1     15: GERD: continue PPI   16: ? BPH: continue Proscar 5 mg daily             -monitor voiding patterns   LOS: 4 days A FACE TO FACE EVALUATION WAS PERFORMED  Erick Colace 05/29/2023, 8:32 AM

## 2023-05-29 NOTE — Progress Notes (Signed)
Occupational Therapy Session Note  Patient Details  Name: [name] MRN: [id number] Date of Birth: February 01, 1956  Today's Date: 05/29/2023 OT Individual Time: 7829-5621 OT Individual Time Calculation (min): 75 min    Short Term Goals: Week 1:  OT Short Term Goal 1 (Week 1): STG=LTG d/t ELOS  Skilled Therapeutic Interventions/Progress Updates:    Pt received sitting in recliner finishing breakfast.  Pt had no c/o pain this am and was agreeable to OT treatment session.  Pt requested to take a shower and stated that he was going on a community outing with Rec therapy at 9:00am. Pt able to recall his sternal precautions.   Stitches below sternum were covered with gauze and tegaderm for preparation for the shower.  Functional mobility to shower with CGA and single point cane in R hand ~43ft.  Pt and OTS had discussion about weakness in his R Upper and LE.  Shower tasks performed with (S) while seated on shower bench with back.  OTS issued pt with long handled sponge for posterior and LE bathing to promote increased independence with shower tasks.  Min verbal cueing for proper use of long handled sponge Functional mobility ~10 to wheelchair with single point cane and CGA .  Pt able to complete Upper and LB dressing with (S).  Pt taken to gym to complete fine motor skill activity with small pegs and peg board.  Pt did have difficulty manipulating small pegs in R hand and had difficulty with coordination.  Discussed pts theraputty exercises and he stated that the putty was too hard.  Tan extra soft putty provided to pt and he was able to complete lateral pinch and pull, and had difficulty completing pincer grasp with digits 3-5. Pt back in room sitting in wheelchair waiting for community outing, call light within reach and all needs met.  Therapy Documentation Precautions:  Precautions Precautions: Fall, Sternal Precaution Comments: sternal precautions Restrictions Weight Bearing Restrictions:  Yes RUE Weight Bearing: Non weight bearing LUE Weight Bearing: Non weight bearing Other Position/Activity Restrictions: sternal precautions      Therapy/Group: Individual Therapy  Liam Graham 05/29/2023, 7:29 AM

## 2023-05-29 NOTE — Progress Notes (Signed)
Recreational Therapy Session Note  Patient Details  Name: EFTON WINCHELL MRN: [id number] Date of Birth: [birth year] Today's Date: 05/29/2023 Time:  07-1014 Pain: no c/o Skilled Therapeutic Interventions/Progress Updates: Pt participated in Community reintegration/outing to [employer] at [employer]	[place] contact guard ambulatory level using RW.  Pt ambualted >1000' before needing a seated rest break.  Pt demonstrated good safety awareness recognizing need for rest with min cues.  Goals focused on safe community mobility, identification & negotiation of obstacles, accessing public restroom, energy conservation techniques/education.  See outing goal sheet in shadow chart for full details.   Therapy/Group: [employer]   Jessamy Torosyan 05/29/2023, 10:39 AM

## 2023-05-29 NOTE — Progress Notes (Signed)
Physical Therapy Session Note  Patient Details  Name: [name] MRN: [id number] Date of Birth: 07-14-1956  Today's Date: 05/29/2023 PT Individual Time: 1300-1345 PT Individual Time Calculation (min): 45 min   Short Term Goals: Week 1:  PT Short Term Goal 1 (Week 1): = to LTGs based on ELOS  Skilled Therapeutic Interventions/Progress Updates: Pt presents sitting in recliner and agreeable to therapy, although fatigued from outing.  Pt transfers sit to stand w/ CGA.  Pt utilized RW this PM 2/2 fatigue.  Pt amb up to 120' w/ RW and CGA, verbal cues for RLE clearance and placement.  Pt performed Nu-step x 15' at Level 3.  Encouraged pt to concentrate on maintaining neutral rotation R hip and controlled "pedaling" to avoid hard stop at extremes for improved NMR.  Pt averaged 24 spm and total of 369 steps.  Pt performed standing hooking/unhooking horseshoes from Bball hoop, alternating UES, crossing midline and then performed w/ LLE on Yoga block.  Pt amb to room and returned to bed.  Pt transfers sit to supine w/ log roll and supervision.  Bed alarm on and all needs in reach.     Therapy Documentation Precautions:  Precautions Precautions: Fall, Sternal Precaution Comments: sternal precautions Restrictions Weight Bearing Restrictions: Yes RUE Weight Bearing: Non weight bearing LUE Weight Bearing: Non weight bearing Other Position/Activity Restrictions: sternal precautions General:   Vital Signs:   Pain:0/10.       Therapy/Group: Individual Therapy  Lucio Edward 05/29/2023, 1:49 PM

## 2023-05-30 DIAGNOSIS — I10 Essential (primary) hypertension: Secondary | ICD-10-CM

## 2023-05-30 DIAGNOSIS — I639 Cerebral infarction, unspecified: Secondary | ICD-10-CM

## 2023-05-30 DIAGNOSIS — R7989 Other specified abnormal findings of blood chemistry: Secondary | ICD-10-CM

## 2023-05-30 LAB — PROTIME-INR
INR: 2.3 — ABNORMAL HIGH (ref 0.8–1.2)
Prothrombin Time: 25.8 seconds — ABNORMAL HIGH (ref 11.4–15.2)

## 2023-05-30 MED ORDER — MELATONIN 5 MG PO TABS
5.0000 mg | ORAL_TABLET | Freq: Every day | ORAL | Status: DC
  Administered 2023-05-30 – 2023-06-03 (×2): 5 mg via ORAL
  Filled 2023-05-30 (×4): qty 1

## 2023-05-30 MED ORDER — WARFARIN SODIUM 2 MG PO TABS
2.0000 mg | ORAL_TABLET | Freq: Once | ORAL | Status: AC
  Administered 2023-05-30: 2 mg via ORAL
  Filled 2023-05-30: qty 1

## 2023-05-30 NOTE — Progress Notes (Signed)
PROGRESS NOTE   Subjective/Complaints:  Didn't sleep well last night. Hesitant to take something. Finds he naps during the day. Is happy with progress in strength  ROS: Patient denies fever, rash, sore throat, blurred vision, dizziness, nausea, vomiting, diarrhea, cough, shortness of breath or chest pain, joint or back/neck pain, headache, or mood change.   Objective:   No results found. Recent Labs    05/29/23 0605  WBC 9.2  HGB 9.2*  HCT 29.2*  PLT 278   No results for input(s): "NA", "K", "CL", "CO2", "GLUCOSE", "BUN", "CREATININE", "CALCIUM" in the last 72 hours.   Intake/Output Summary (Last 24 hours) at 05/30/2023 1002 Last data filed at 05/29/2023 1817 Gross per 24 hour  Intake 472 ml  Output --  Net 472 ml        Physical Exam: Vital Signs Blood pressure (!) 142/91, pulse 87, temperature 98.3 F (36.8 C), temperature source Oral, resp. rate 18, height 5\' 8"  (1.727 m), weight 73.8 kg, SpO2 100%.   Constitutional: No distress . Vital signs reviewed. HEENT: NCAT, EOMI, oral membranes moist Neck: supple Cardiovascular: RRR without murmur. No JVD    Respiratory/Chest: CTA Bilaterally without wheezes or rales. Normal effort    GI/Abdomen: BS +, non-tender, non-distended Ext: no clubbing, cyanosis, or edema Psych: pleasant and cooperative  Skin: No evidence of breakdown, no evidence of rash Neurologic: Cranial nerves II through XII intact, motor strength is 5/5 in left deltoid, bicep, tricep, grip, hip flexor, knee extensors, ankle dorsiflexor and plantar flexor 3+ to 4-/5 strength in the right deltoid, bicep, tricep, grip, 4/5 strength in the right hip flexor knee extensor ankle dorsiflexor and plantar flexor Sensory exam normal sensation to light touch  in bilateral upper and lower extremities Finger to thumb opposition is reduced he can oppose his index and middle finger but not the ring and little finger.   The patient has very slow opposition  Musculoskeletal: Full range of motion in all 4 extremities. No joint swelling   Assessment/Plan: 1. Functional deficits which require 3+ hours per day of interdisciplinary therapy in a comprehensive inpatient rehab setting. Physiatrist is providing close team supervision and 24 hour management of active medical problems listed below. Physiatrist and rehab team continue to assess barriers to discharge/monitor patient progress toward functional and medical goals  Care Tool:  Bathing    Body parts bathed by patient: Right arm, Left arm, Chest, Abdomen, Front perineal area, Right upper leg, Left upper leg, Face   Body parts bathed by helper: Buttocks, Right lower leg, Left lower leg     Bathing assist Assist Level: Minimal Assistance - Patient > 75%     Upper Body Dressing/Undressing Upper body dressing   What is the patient wearing?: Pull over shirt    Upper body assist Assist Level: Minimal Assistance - Patient > 75%    Lower Body Dressing/Undressing Lower body dressing      What is the patient wearing?: Pants     Lower body assist Assist for lower body dressing: Moderate Assistance - Patient 50 - 74%     Toileting Toileting    Toileting assist Assist for toileting: Moderate Assistance - Patient 50 -  74% Assistive Device Comment: RW   Transfers Chair/bed transfer  Transfers assist     Chair/bed transfer assist level: Minimal Assistance - Patient > 75%     Locomotion Ambulation   Ambulation assist      Assist level: Contact Guard/Touching assist Assistive device: Walker-rolling Max distance: 120   Walk 10 feet activity   Assist     Assist level: Contact Guard/Touching assist Assistive device: Walker-rolling   Walk 50 feet activity   Assist    Assist level: Contact Guard/Touching assist Assistive device: Walker-rolling    Walk 150 feet activity   Assist    Assist level: Minimal Assistance -  Patient > 75% Assistive device: Hand held assist    Walk 10 feet on uneven surface  activity   Assist     Assist level: Minimal Assistance - Patient > 75% Assistive device: Hand held assist   Wheelchair     Assist Is the patient using a wheelchair?: No             Wheelchair 50 feet with 2 turns activity    Assist            Wheelchair 150 feet activity     Assist          Blood pressure (!) 142/91, pulse 87, temperature 98.3 F (36.8 C), temperature source Oral, resp. rate 18, height 5\' 8"  (1.727 m), weight 73.8 kg, SpO2 100%.  Medical Problem List and Plan: 1. 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  Primarily has right upper extremity weakness and decreased balance             -patient may shower             -ELOS/Goals: 7/18 mod I goals with PT and OT   2.  Antithrombotics: -DVT/anticoagulation:  Pharmaceutical: Coumadin             -antiplatelet therapy: Aspirin 81 mg indefinitely   3. Pain Management. Has long hx of low back pain followed by ortho. Having intermittent radicular-type symptoms in the RLE. Also has had cervical symptoms as well We discussed no elective surgeries recommended for 6 mo post CVA -Tylenol, tramodol, oxycodone as needed             -baclofen 10 mg daily             -consider trial of gabapentin             -encouraging stretching, oob, physical activity to tolerance   4. Mood/Behavior/Sleep: LCSW to evaluate and provide emotional support             -antipsychotic agents: n/a   -will schedule melatonin 5mg  qhs 5. Neuropsych/cognition: This patient is capable of making decisions on his own behalf.   6. Skin/Wound Care: Routine skin care checks             -remove chest tube sutures on 7/12             -routine sternotomy incision care   7. Fluids/Electrolytes/Nutrition: Routine Is and Os and follow-up chemistries   8: Hypertension:  monitor TID and prn             -continue Lopressor 12.5 mg Bid--generally controlled Vitals:   05/29/23 1948 05/30/23 0457  BP: 109/69 (!) 142/91  Pulse: 75 87  Resp: 18 18  Temp: 98.3 F (36.8 C) 98.3 F (36.8 C)  SpO2: 100% 100%  9: Hyperlipidemia: continue statin   11: severe aortic stenosis s/p AVR and aortic root replacement 7/01             -sternal precautions, coumadin             -follow-up with Dr. Laneta Simmers -TTE in 6 weeks, SBE prophylaxis    12: postop atrial fibrillation:rate controlled; on Coumadin (pharmacy consult)             -follow INR -continue amiodarone 200mg  daily   13: ABLA: stable; follow-up CBC             -continue oral iron supplementation Recheck Monday     Latest Ref Rng & Units 05/29/2023    6:05 AM 05/26/2023    5:44 AM 05/23/2023    8:15 AM  CBC  WBC 4.0 - 10.5 K/uL 9.2  8.8  7.0   Hemoglobin 13.0 - 17.0 g/dL 9.2  8.5  8.1   Hematocrit 39.0 - 52.0 % 29.2  26.8  25.1   Platelets 150 - 400 K/uL 278  247  175       14: azotemia: encourage oral fluids and follow-up BMP Monday       Latest Ref Rng & Units 05/26/2023    5:44 AM 05/23/2023    8:15 AM 05/21/2023    5:51 AM  BMP  Glucose 70 - 99 mg/dL 161  096  045   BUN 8 - 23 mg/dL 29  26  26    Creatinine 0.61 - 1.24 mg/dL 4.09  8.11  9.14   Sodium 135 - 145 mmol/L 137  140  135   Potassium 3.5 - 5.1 mmol/L 4.3  3.5  3.8   Chloride 98 - 111 mmol/L 105  99  104   CO2 22 - 32 mmol/L 22  25  24    Calcium 8.9 - 10.3 mg/dL 8.5  8.7  8.1     15: GERD: continue PPI   16: ? BPH: continue Proscar 5 mg daily             -monitor voiding patterns   LOS: 5 days A FACE TO FACE EVALUATION WAS PERFORMED  Ranelle Oyster 05/30/2023, 10:02 AM

## 2023-05-30 NOTE — Progress Notes (Signed)
ANTICOAGULATION CONSULT NOTE - Follow Up Consult  Pharmacy Consult for warfarin Indication: CVA  Allergies  Allergen Reactions   Alcohol Other (See Comments)    Pts skin gets red. Ex. Pt used alcohol based deodorant and got red things under arms. & Drinks alcohol notices "from heart on up" everything turns red.   Chlorthalidone     Dizziness and Syncopal Episode    Patient Measurements: Height: 5\' 8"  (172.7 cm) Weight: 73.8 kg (162 lb 11.2 oz) IBW/kg (Calculated) : 68.4  Vital Signs: Temp: 98.3 F (36.8 C) (07/13 0457) Temp Source: Oral (07/13 0457) BP: 142/91 (07/13 0457) Pulse Rate: 87 (07/13 0457)  Labs: Recent Labs    05/28/23 0745 05/29/23 0605 05/30/23 0556  HGB  --  9.2*  --   HCT  --  29.2*  --   PLT  --  278  --   LABPROT 27.6* 26.5* 25.8*  INR 2.5* 2.4* 2.3*    Estimated Creatinine Clearance: 74.6 mL/min (by C-G formula based on SCr of 0.93 mg/dL).   Medications:  Scheduled:   amiodarone  200 mg Oral Daily   aspirin EC  81 mg Oral Daily   atorvastatin  80 mg Oral Daily   baclofen  10 mg Oral Daily   bisacodyl  5 mg Oral Q1400   Fe Fum-Vit C-Vit B12-FA  1 capsule Oral QPC breakfast   finasteride  5 mg Oral Daily   metoprolol tartrate  12.5 mg Oral BID   nutrition supplement (JUVEN)  1 packet Oral BID BM   pantoprazole  40 mg Oral Daily   polyethylene glycol  17 g Oral Daily   Warfarin - Pharmacist Dosing Inpatient   Does not apply q1600   PRN: acetaminophen, alum & mag hydroxide-simeth, bisacodyl, diphenhydrAMINE, guaiFENesin-dextromethorphan, methocarbamol, oxyCODONE, traMADol, traZODone  Assessment: 18 YOM who was initiated on warfarin by TCTS MD on 05/22/23 during inpatient admission and was managed by TCTS MD. Patient has transferred to rehab and as confirmed with [facility], pharmacy will continue managing warfarin.   While inpatient, patient received 5 mg dose on 7/5, 5 mg dose on 7/6, and 2.5 mg dose on 7/7. INR increased from 2.6 to 3.6 on 7/8,  which resulted in holding warfarin dose on 7/8. Warfarin resumed on 7/10 when INR 2.3 (therapeutic). Varying doses the last few days, 2.5, 1.25, then 2 mg since 7/11. INR remains therapeutic at 2.3 today, therefore, it is appropriate to continue current 2 mg dose. CBC stable, Hgb improved.    Of note, on amiodarone, which can increase sensitivity to warfarin; will monitor closely.    Goal of Therapy:  INR 2-3 Monitor platelets by anticoagulation protocol: Yes   Plan:  Warfarin 2 mg x 1 again today. Daily PT/INR. CBC intermittently. Monitor for signs/symptoms of bleeding.    Roslyn Smiling, PharmD Pharmacy Resident 05/30/2023,9:53 AM

## 2023-05-30 NOTE — Progress Notes (Signed)
Physical Therapy Session Note  Patient Details  Name: [name] MRN: [id number] Date of Birth: 15-Nov-1956  Today's Date: 05/30/2023 PT Individual Time: 4098-1191 PT Individual Time Calculation (min): 72 min   Short Term Goals: Week 1:  PT Short Term Goal 1 (Week 1): = to LTGs based on ELOS  Skilled Therapeutic Interventions/Progress Updates: Pt presents supine in bed and agreeable to therapy.  Pt transfers sup to sit via log roll technique and supervision.  Pt donned shoes w/ max A for time.  Pt transferred sit to stand w/ CGA and amb to dayroom w/ CGA to supervision.  Pt w/ stiff RLE, requiring verbal cues for knee flexion.  Pt advancing w/ slight circumduction and hip hike.  Pt tolerated Nu-step at Level 3 x 9' and then 6' at Level 4.  Pt encouraged to performed controlled revolutions w/ LES only w/o hard bumps at extremes.  Pt performed 369 steps at an average of 28 spm.  Pt performed sit to stand blocks w/ CGA/supervision but occ verbal cues for forward lean.  Pt performed on Airex cushion as well as L foot on Airex only.  Pt amb w/o AD x 180' w/ decreased BOS RLE, marching w/ poor positioning of RLE x 40%.  Pt performed balance activity of stepping to identified numbered disc.  Pt also performed w/ added math problem to correct disc, 6 out of 7 times.  Pt returned to room w/ RW and close superision.  Pt transferred sit to sidelying and then supine w/ supervision.  Bed alarm on and all needs in reach.     Therapy Documentation Precautions:  Precautions Precautions: Fall, Sternal Precaution Comments: sternal precautions Restrictions Weight Bearing Restrictions: No RUE Weight Bearing: Non weight bearing LUE Weight Bearing: Non weight bearing Other Position/Activity Restrictions: sternal precautions General:   Vital Signs: Therapy Vitals Temp: 98.5 F (36.9 C) Pulse Rate: 86 Resp: 18 BP: 99/73 Patient Position (if appropriate): Lying Oxygen Therapy SpO2: 95 % O2 Device:  Room Air Pain:0/10      Therapy/Group: Individual Therapy  Lucio Edward 05/30/2023, 3:33 PM

## 2023-05-30 NOTE — Progress Notes (Addendum)
Occupational Therapy Session Note  Patient Details  Name: [name] MRN: [id number] Date of Birth: 21-Nov-1955  Today's Date: 05/30/2023 OT Individual Time: 0935-1030 OT Individual Time Calculation (min): 55 min    Short Term Goals: Week 1:  OT Short Term Goal 1 (Week 1): STG=LTG d/t ELOS  Skilled Therapeutic Interventions/Progress Updates:  Pt received resting in bed for skilled OT session with focus on BADL participation and RUE NMR. Pt agreeable to interventions, demonstrating overall pleasant mood. Pt with no reports of pain. OT offering intermediate rest breaks and positioning suggestions throughout session to address potential pain/fatigue and maximize participation/safety in session.   Pt performs supine>sit EOB with HOB elevated + supervision. Pt with urgency, ambulating into/out bathroom with CGA + RW, performing transfer with same level of assistance. Pt continent of BM, see flowsheets, completing 3/3 toileting activities with supervision + lateral leans for pericare. Pt requesting to sit at sink-side for oral care and grooming as a form of energy conservation. Pt dependent for WC transport from room <> day room for energy/time conservation.  In day room, pt instructed in series of R-hand ROM/strengthening exercises, noted below: Gross Hand and Wrist Movement (rolling) Gross Finger Flexion Thumb Flexion Isolated Finger Flexion Finger Abduction  Pt performs 10-12 reps of each exercise with use of tan thera-putty. Isolated opposition attempted but patient is unable to perform this session, with isolated finger flexion becoming more-so gross opposition movement due to decreased distal functioning. Pt provided with short HEP of above exercises to carry into time outside of therapy.  Pt does experience intermediate frustration with current functioning, receptive of education on stroke recovery.   Pt remained resting in bed with all immediate needs met at end of session. Pt  continues to be appropriate for skilled OT intervention to promote further functional independence.   Therapy Documentation Precautions:  Precautions Precautions: Fall, Sternal Precaution Comments: sternal precautions Restrictions Weight Bearing Restrictions: No RUE Weight Bearing: Non weight bearing LUE Weight Bearing: Non weight bearing Other Position/Activity Restrictions: sternal precautions   Therapy/Group: Individual Therapy  Lou Cal, OTR/L, MSOT  05/30/2023, 5:29 AM

## 2023-05-30 NOTE — Progress Notes (Signed)
Physical Therapy Session Note  Patient Details  Name: [name] MRN: [id number] Date of Birth: May 21, 1956  Today's Date: 05/30/2023 PT Individual Time: 7829-5621 PT Individual Time Calculation (min): 59 min   Short Term Goals: Week 1:  PT Short Term Goal 1 (Week 1): = to LTGs based on ELOS  Skilled Therapeutic Interventions/Progress Updates:    Pt received supine in bed awake and agreeable to therapy session. Pt reports the outing yesterday made him realize that the CVA affected his R LE too, that it wasn't just his R UE, and states "I can't walk like I used to." Pt also states he is continuing to deal with significant fatigue and endurance deficits.  Supine>sitting L EOB, HOB partially elevated and using bedrail, with SBA for safety. Reports need to use bathroom. Sit>stand EOB>no UE support with CGA/light min assist for steadying. Gait into bathroom, no AD, with light min assist for steadying - continues to have R LE circumduction compensation with lack of R LE hip/knee flexion and ankle DF during swing. Standing with R UE support on grab bar performed LB clothing management without assist - continent of bowels and bladder, seated peri-care set-up assist.  Standing hand hygiene at sink with CGA for balance.   Gait training ~14ft to main therapy gym, no UE support, with consistent min A and a few instances of heavier min A for balance due to poor R LE foot clearance and poor foot placement after swing. Pt demonstrating the following gait deviations with therapist providing the described cuing and facilitation for improvement:  - continues to have poor R LE hip/knee flexion for foot clearance during swing advancement, able to correct some with verbal cuing but inconsistent - prolonged L stance phase  - overall balance instability due to R LE paresis   Educated pt on AFOs and donned R LE [place] Walk-on PLS AFO.   Gait training ~57ft x2 to/from stairs, no UE support, with more CGA and  light min A for balance with pt demonstrating significant improvement in R LE foot clearance and heel striek on initial contact - pt reports his walking feels more "natural" now and reports feeling safer because he isn't catching his R toes as frequently.   Stair navigation training ascending/descending 12 steps (6" height) using B HRs with light min assist - pt achieving reciprocal stepping pattern in both directions with improving ability to perform R hip/knee flexion to advance onto next step - good R LE knee control when lowering during descent - overall decreased compensation with B UE support on HRs to maintain upright.  Dynamic gait training using agility ladder including the following: - forward reciprocal stepping pattern with 4lb ankle weight on R LE, catches R toes ~3x benefiting from repeated cuing for increased hip/knee flexion sooner at toe-off (continues to have to perform hip flexion in order to achieve knee flexion due to weakness and impaired motor planning/muscle recruitment) - min assist for balance throughout - side stepping down/back x2 each direction with 4lb ankle weight on R LE and min assist for balance with increased difficulty stepping towards R  Reached out to [employer] to schedule AFO consult next week.   Gait training ~251ft back towards his room, no UE support, with light min assist for balance throughout - pt continues to benefit from cuing to ensure adequate R hip/knee flexion during swing; however, improved compared to without AFO resulting in increased gait speed and increased balance stability.   At end of session, pt left  seated in recliner with needs in reach, chair alarm on, and meal tray set-up.    Therapy Documentation Precautions:  Precautions Precautions: Fall, Sternal Precaution Comments: sternal precautions Restrictions Weight Bearing Restrictions: No RUE Weight Bearing: Non weight bearing LUE Weight Bearing: Non weight bearing Other  Position/Activity Restrictions: sternal precautions  Pain:  No complaints of pain, just reports feeling like he needs to catch his breath with poor cardiopulmonary endurance - frequent seated rest breaks provided.    Therapy/Group: Individual Therapy  Ginny Forth , PT, DPT, NCS, CSRS 05/30/2023, 3:34 PM

## 2023-05-30 NOTE — Progress Notes (Signed)
Patient w/o complaint or acute distress, chest tube incision drains intact, no noted redness or irritation to abdominal areas. Monitor and assisted with ADL and mobility as needed

## 2023-05-31 LAB — PROTIME-INR
INR: 2.3 — ABNORMAL HIGH (ref 0.8–1.2)
Prothrombin Time: 25.6 seconds — ABNORMAL HIGH (ref 11.4–15.2)

## 2023-05-31 MED ORDER — WARFARIN SODIUM 2 MG PO TABS
2.0000 mg | ORAL_TABLET | Freq: Once | ORAL | Status: AC
  Administered 2023-05-31: 2 mg via ORAL
  Filled 2023-05-31: qty 1

## 2023-05-31 NOTE — Progress Notes (Addendum)
Physical Therapy Session Note  Patient Details  Name: [name] MRN: [id number] Date of Birth: [birth year]  Today's Date: 05/31/2023 PT Individual Time: 7829-5621 PT Individual Time Calculation (min): 27 min   Short Term Goals: Week 1:  PT Short Term Goal 1 (Week 1): = to LTGs based on ELOS  Skilled Therapeutic Interventions/Progress Updates:     Pt supine in bed with brother in room upon arrival. Pt agreeable to therapy. Pt denies any pain. Pt reports feeling weaker today.   Pt performed supine to sit with supervision. Pt donned L shoe while seated EOB with min A for tying L LE, and R LE AFO and shoe with max A.   Pt ambulated from room and lap around day room with +1 min A, verbal and tactile cues provided for R LE hip and knee flexion with advancement versus hip hike and circumduction.   Pt reports R LE stiffness today, reports stiffness typically decreases.  Pt performed ambulatory transfer to nu step with +1 min A and verbal cues provided for UE positioning. Pt completed 8 min on nu step at workload 2 with 30 steps per minutes, verbal cues provided to keep hips in midline versus abducted.   Pt ambulated ~200 feet with no AD and min A, pt reports decreased stiffness and initially demos improved R LE advancement but decliners with fatigue without verbal cues, verbal cues provided for hip and knee flexion versus hip hike/hip circumduction.   Pt performed ambulatory transfer to bed with no AD and min A.   Education provided on exercises to do in bed including:   R LE SLR  R LE ankle pumps  B heel slides with gait belt around legs to assist with alignment of hip in neutral.   Pt supine in bed with bed alarm on and needs within reach   Therapy Documentation Precautions:  Precautions Precautions: Fall, Sternal Precaution Comments: sternal precautions Restrictions Weight Bearing Restrictions: No RUE Weight Bearing: Non weight bearing LUE Weight Bearing: Non weight  bearing Other Position/Activity Restrictions: sternal precautions  Therapy/Group: Individual Therapy  [facility] Ambrose Finland, [place], DPT  05/31/2023, 10:58 AM

## 2023-05-31 NOTE — Progress Notes (Signed)
Pt with c/o fatigue since beginning of shift. Per chart, pt received a dose of melatonin last night that pt stated he never had before. Encouraged pt to rest.  At 1300, Pt with c/o tingling on the right arm. BP noted to be 90/63, then checked manually and was 90/58 at rest. No c/o pain or any other symptoms. Dr. Riley Kill made aware. MD Instructed neuro check every 2 hours X3. No other new orders at this time.  Marylu Lund, RN

## 2023-05-31 NOTE — Progress Notes (Signed)
PROGRESS NOTE   Subjective/Complaints:  Pt slept much better last night. Feels good this am. No new c/o  ROS: Patient denies fever, rash, sore throat, blurred vision, dizziness, nausea, vomiting, diarrhea, cough, shortness of breath or chest pain, joint or back/neck pain, headache, or mood change.   Objective:   No results found. Recent Labs    05/29/23 0605  WBC 9.2  HGB 9.2*  HCT 29.2*  PLT 278   No results for input(s): "NA", "K", "CL", "CO2", "GLUCOSE", "BUN", "CREATININE", "CALCIUM" in the last 72 hours.   Intake/Output Summary (Last 24 hours) at 05/31/2023 0924 Last data filed at 05/31/2023 0523 Gross per 24 hour  Intake 780 ml  Output --  Net 780 ml        Physical Exam: Vital Signs Blood pressure 124/81, pulse 83, temperature 97.6 F (36.4 C), resp. rate 20, height 5\' 8"  (1.727 m), weight 75.2 kg, SpO2 99%.   Constitutional: No distress . Vital signs reviewed. HEENT: NCAT, EOMI, oral membranes moist Neck: supple Cardiovascular: RRR without murmur. No JVD    Respiratory/Chest: CTA Bilaterally without wheezes or rales. Normal effort    GI/Abdomen: BS +, non-tender, non-distended Ext: no clubbing, cyanosis, or edema Psych: pleasant and cooperative  Skin: No evidence of breakdown, no evidence of rash Neurologic: Cranial nerves II through XII intact, motor strength is 5/5 in left deltoid, bicep, tricep, grip, hip flexor, knee extensors, ankle dorsiflexor and plantar flexor 3+ to 4-/5 strength in the right deltoid, bicep, tricep, grip improving, 4/5 strength in the right hip flexor knee extensor ankle dorsiflexor and plantar flexor.  Sensory exam normal sensation to light touch  in bilateral upper and lower extremities Musculoskeletal: Full range of motion in all 4 extremities. No joint swelling   Assessment/Plan: 1. Functional deficits which require 3+ hours per day of interdisciplinary therapy in a  comprehensive inpatient rehab setting. Physiatrist is providing close team supervision and 24 hour management of active medical problems listed below. Physiatrist and rehab team continue to assess barriers to discharge/monitor patient progress toward functional and medical goals  Care Tool:  Bathing    Body parts bathed by patient: Right arm, Left arm, Chest, Abdomen, Front perineal area, Right upper leg, Left upper leg, Face   Body parts bathed by helper: Buttocks, Right lower leg, Left lower leg     Bathing assist Assist Level: Minimal Assistance - Patient > 75%     Upper Body Dressing/Undressing Upper body dressing   What is the patient wearing?: Pull over shirt    Upper body assist Assist Level: Minimal Assistance - Patient > 75%    Lower Body Dressing/Undressing Lower body dressing      What is the patient wearing?: Pants     Lower body assist Assist for lower body dressing: Moderate Assistance - Patient 50 - 74%     Toileting Toileting    Toileting assist Assist for toileting: Moderate Assistance - Patient 50 - 74% Assistive Device Comment: RW   Transfers Chair/bed transfer  Transfers assist     Chair/bed transfer assist level: Minimal Assistance - Patient > 75%     Locomotion Ambulation   Ambulation assist  Assist level: Minimal Assistance - Patient > 75% Assistive device: Orthosis Max distance: 211ft   Walk 10 feet activity   Assist     Assist level: Minimal Assistance - Patient > 75% Assistive device: No Device, Orthosis   Walk 50 feet activity   Assist    Assist level: Minimal Assistance - Patient > 75% Assistive device: No Device, Orthosis    Walk 150 feet activity   Assist    Assist level: Minimal Assistance - Patient > 75% Assistive device: No Device, Orthosis    Walk 10 feet on uneven surface  activity   Assist     Assist level: Minimal Assistance - Patient > 75% Assistive device: Hand held assist    Wheelchair     Assist Is the patient using a wheelchair?: No             Wheelchair 50 feet with 2 turns activity    Assist            Wheelchair 150 feet activity     Assist          Blood pressure 124/81, pulse 83, temperature 97.6 F (36.4 C), resp. rate 20, height 5\' 8"  (1.727 m), weight 75.2 kg, SpO2 99%.  Medical Problem List and Plan: 1. 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  Primarily has right upper extremity weakness and decreased balance             -patient may shower             -ELOS/Goals: 7/18 mod I goals with PT and OT   -Continue [name] therapies including PT, OT  2.  Antithrombotics: -DVT/anticoagulation:  Pharmaceutical: Coumadin             -antiplatelet therapy: Aspirin 81 mg indefinitely   3. Pain Management. Has long hx of low back pain followed by ortho. Having intermittent radicular-type symptoms in the RLE. Also has had cervical symptoms as well We discussed no elective surgeries recommended for 6 mo post CVA -Tylenol, tramodol, oxycodone as needed             -baclofen 10 mg daily             -consider trial of gabapentin             -encouraging stretching, oob, physical activity to tolerance   -7/14 appears to be comfortable 4. Mood/Behavior/Sleep: LCSW to evaluate and provide emotional support             -antipsychotic agents: n/a   - sleep improved with melatonin 5mg  qhs 5. Neuropsych/cognition: This patient is capable of making decisions on his own behalf.   6. Skin/Wound Care: Routine skin care checks             -remove chest tube sutures on 7/12             -routine sternotomy incision care   7. Fluids/Electrolytes/Nutrition: Routine Is and Os and follow-up chemistries   8: Hypertension: monitor TID and prn             -continue Lopressor 12.5 mg Bid--generally controlled Vitals:   05/30/23 2008 05/31/23 0522  BP: 114/71 124/81   Pulse: 86 83  Resp: 20 20  Temp: 97.9 F (36.6 C) 97.6 F (36.4 C)  SpO2: 100% 99%      9: Hyperlipidemia: continue statin   11: severe aortic stenosis s/p AVR and aortic root replacement 7/01             -  sternal precautions, coumadin             -follow-up with Dr. Laneta Simmers -TTE in 6 weeks, SBE prophylaxis    12: postop atrial fibrillation:rate controlled; on Coumadin (pharmacy consult)             -follow INR -continue amiodarone 200mg  daily   13: ABLA: stable; follow-up CBC             -continue oral iron supplementation Recheck Monday     Latest Ref Rng & Units 05/29/2023    6:05 AM 05/26/2023    5:44 AM 05/23/2023    8:15 AM  CBC  WBC 4.0 - 10.5 K/uL 9.2  8.8  7.0   Hemoglobin 13.0 - 17.0 g/dL 9.2  8.5  8.1   Hematocrit 39.0 - 52.0 % 29.2  26.8  25.1   Platelets 150 - 400 K/uL 278  247  175       14: azotemia: 7/14 encourage oral fluids and follow-up BMP Monday       Latest Ref Rng & Units 05/26/2023    5:44 AM 05/23/2023    8:15 AM 05/21/2023    5:51 AM  BMP  Glucose 70 - 99 mg/dL 161  096  045   BUN 8 - 23 mg/dL 29  26  26    Creatinine 0.61 - 1.24 mg/dL 4.09  8.11  9.14   Sodium 135 - 145 mmol/L 137  140  135   Potassium 3.5 - 5.1 mmol/L 4.3  3.5  3.8   Chloride 98 - 111 mmol/L 105  99  104   CO2 22 - 32 mmol/L 22  25  24    Calcium 8.9 - 10.3 mg/dL 8.5  8.7  8.1     15: GERD: continue PPI   16: ? BPH: continue Proscar 5 mg daily             -monitor voiding patterns   LOS: 6 days A FACE TO FACE EVALUATION WAS PERFORMED  Ranelle Oyster 05/31/2023, 9:24 AM

## 2023-05-31 NOTE — Progress Notes (Signed)
ANTICOAGULATION CONSULT NOTE - Follow Up Consult  Pharmacy Consult for warfarin Indication: CVA  Allergies  Allergen Reactions   Alcohol Other (See Comments)    Pts skin gets red. Ex. Pt used alcohol based deodorant and got red things under arms. & Drinks alcohol notices "from heart on up" everything turns red.   Chlorthalidone     Dizziness and Syncopal Episode    Patient Measurements: Height: 5\' 8"  (172.7 cm) Weight: 75.2 kg (165 lb 11.2 oz) IBW/kg (Calculated) : 68.4  Vital Signs: Temp: 97.6 F (36.4 C) (07/14 0522) BP: 124/81 (07/14 0522) Pulse Rate: 83 (07/14 0522)  Labs: Recent Labs    05/29/23 0605 05/30/23 0556 05/31/23 0535  HGB 9.2*  --   --   HCT 29.2*  --   --   PLT 278  --   --   LABPROT 26.5* 25.8* 25.6*  INR 2.4* 2.3* 2.3*    Estimated Creatinine Clearance: 74.6 mL/min (by C-G formula based on SCr of 0.93 mg/dL).   Medications:  Scheduled:   amiodarone  200 mg Oral Daily   aspirin EC  81 mg Oral Daily   atorvastatin  80 mg Oral Daily   baclofen  10 mg Oral Daily   bisacodyl  5 mg Oral Q1400   Fe Fum-Vit C-Vit B12-FA  1 capsule Oral QPC breakfast   finasteride  5 mg Oral Daily   melatonin  5 mg Oral QHS   metoprolol tartrate  12.5 mg Oral BID   nutrition supplement (JUVEN)  1 packet Oral BID BM   pantoprazole  40 mg Oral Daily   polyethylene glycol  17 g Oral Daily   Warfarin - Pharmacist Dosing Inpatient   Does not apply q1600    Assessment: 71 YOM who was initiated on warfarin by TCTS MD on 05/22/23 during inpatient admission and was managed by TCTS MD. Patient has transferred to rehab and as confirmed with TCTS, pharmacy will continue managing warfarin.   While inpatient, patient received 5 mg dose on 7/5, 5 mg dose on 7/6, and 2.5 mg dose on 7/7. INR increased from 2.6 to 3.6 on 7/8, which resulted in holding warfarin dose on 7/8. Warfarin resumed on 7/10 when INR 2.3 (therapeutic). Varying doses the last few days, 2.5, 1.25, then 2 mg since  7/11. INR remains therapeutic at 2.3 today, therefore, it is appropriate to continue current 2 mg dose. CBC stable.   Of note, on amiodarone, which can increase sensitivity to warfarin; will monitor closely.   Goal of Therapy:  INR 2-3 Monitor platelets by anticoagulation protocol: Yes   Plan:  Warfarin 2 mg x 1 again today. Daily PT/INR. CBC intermittently. Monitor for signs/symptoms of bleeding.    Roslyn Smiling, PharmD Pharmacy Resident

## 2023-06-01 LAB — CBC
HCT: 30.7 % — ABNORMAL LOW (ref 39.0–52.0)
Hemoglobin: 9.7 g/dL — ABNORMAL LOW (ref 13.0–17.0)
MCH: 29.8 pg (ref 26.0–34.0)
MCHC: 31.6 g/dL (ref 30.0–36.0)
MCV: 94.2 fL (ref 80.0–100.0)
Platelets: 355 10*3/uL (ref 150–400)
RBC: 3.26 MIL/uL — ABNORMAL LOW (ref 4.22–5.81)
RDW: 16 % — ABNORMAL HIGH (ref 11.5–15.5)
WBC: 9.5 10*3/uL (ref 4.0–10.5)
nRBC: 0 % (ref 0.0–0.2)

## 2023-06-01 LAB — BASIC METABOLIC PANEL
Anion gap: 10 (ref 5–15)
BUN: 24 mg/dL — ABNORMAL HIGH (ref 8–23)
CO2: 21 mmol/L — ABNORMAL LOW (ref 22–32)
Calcium: 8.7 mg/dL — ABNORMAL LOW (ref 8.9–10.3)
Chloride: 104 mmol/L (ref 98–111)
Creatinine, Ser: 1.04 mg/dL (ref 0.61–1.24)
GFR, Estimated: >60 mL/min (ref >60)
Glucose, Bld: 106 mg/dL — ABNORMAL HIGH (ref 70–99)
Potassium: 4 mmol/L (ref 3.5–5.1)
Sodium: 135 mmol/L (ref 135–145)

## 2023-06-01 LAB — PROTIME-INR
INR: 2.1 — ABNORMAL HIGH (ref 0.8–1.2)
Prothrombin Time: 23.9 seconds — ABNORMAL HIGH (ref 11.4–15.2)

## 2023-06-01 MED ORDER — WARFARIN SODIUM 2 MG PO TABS
2.0000 mg | ORAL_TABLET | Freq: Every day | ORAL | Status: DC
  Administered 2023-06-01 – 2023-06-02 (×2): 2 mg via ORAL
  Filled 2023-06-01 (×2): qty 1

## 2023-06-01 NOTE — Progress Notes (Signed)
Reports feeling better. Refused scheduled melatonin. Right PRAFO and Right WHO applied at HS. Sternal incision and chest tube sites healed. Encouraged patient to use I.S., within reach. Productive cough, thick tan sputum. Alfredo Martinez A

## 2023-06-01 NOTE — Plan of Care (Signed)
  Problem: Consults Goal: RH STROKE PATIENT EDUCATION Description: See Patient Education module for education specifics  Outcome: Progressing   Problem: RH SAFETY Goal: RH STG ADHERE TO SAFETY PRECAUTIONS W/ASSISTANCE/DEVICE Description: STG Adhere to Safety Precautions With cues Assistance/Device. Outcome: Progressing   Problem: RH KNOWLEDGE DEFICIT Goal: RH STG INCREASE KNOWLEDGE OF HYPERTENSION Description: Patient and wife will be able to manage HTN with medications and dietary modifications using educational resources independently Outcome: Progressing Nurse and patient discussed patients hypertension medications and what dietary modifications to take to keep hypertension down. Goal: RH STG INCREASE KNOWLEGDE OF HYPERLIPIDEMIA Description: Patient and wife will be able to manage HLD with medications and dietary modifications using educational resources independently Outcome: Progressing Goal: RH STG INCREASE KNOWLEDGE OF STROKE PROPHYLAXIS Description: Patient and wife will be able to manage a-fib and secondary risks with medications and dietary modifications using educational resources independently Outcome: Progressing

## 2023-06-01 NOTE — Progress Notes (Signed)
Physical Therapy Session Note  Patient Details  Name: [name] MRN: [id number] Date of Birth: 1956/05/16  Today's Date: 06/01/2023 PT Individual Time: 1000-1040 PT Individual Time Calculation (min): 40 min   Short Term Goals: Week 1:  PT Short Term Goal 1 (Week 1): = to LTGs based on ELOS  Skilled Therapeutic Interventions/Progress Updates:    Chart reviewed and pt agreeable to therapy. Pt received seated EOB with no c/o pain. Session focused on amb endurance and quality, preparation for family training, and functional transfers to promote safe home access. Pt initiated session with amb to/from toilet using close S + RW. Pt required S for peri-care. Pt and PT discussed preparation for family training with communication to family included via phone. PT, pt, and family confirmed a time frame preference and reviewed expectations of session. MD entered room and confirmed need to check BP for OH. Pt then completed amb of 257ft with S + RW. During sit to stand and amb, BP assessed per figures below. Session education emphasized safe DME use. At end of session, pt was left seated EOB with alarm engaged, nurse call bell and all needs in reach.  BP sitting:        94/76 mmHg (83) BP standing:  96/76 mmHg (86) BP after 234ft amb:  123/80 mmHg (94)  Therapy Documentation Precautions:  Precautions Precautions: Fall, Sternal Precaution Comments: sternal precautions Restrictions Weight Bearing Restrictions: No RUE Weight Bearing: Non weight bearing LUE Weight Bearing: Non weight bearing Other Position/Activity Restrictions: sternal precautions General:      Therapy/Group: Individual Therapy  Dionne Milo, PT, DPT 06/01/2023, 3:59 PM

## 2023-06-01 NOTE — Plan of Care (Signed)
  Problem: Consults Goal: RH STROKE PATIENT EDUCATION Description: See Patient Education module for education specifics  Outcome: Progressing   Problem: RH SAFETY Goal: RH STG ADHERE TO SAFETY PRECAUTIONS W/ASSISTANCE/DEVICE Description: STG Adhere to Safety Precautions With cues Assistance/Device. Outcome: Progressing   Problem: RH KNOWLEDGE DEFICIT Goal: RH STG INCREASE KNOWLEDGE OF HYPERTENSION Description: Patient and wife will be able to manage HTN with medications and dietary modifications using educational resources independently Outcome: Progressing Goal: RH STG INCREASE KNOWLEDGE OF STROKE PROPHYLAXIS Description: Patient and wife will be able to manage a-fib and secondary risks with medications and dietary modifications using educational resources independently Outcome: Progressing

## 2023-06-01 NOTE — Progress Notes (Signed)
Occupational Therapy Session Note  Patient Details  Name: [name] MRN: [id number] Date of Birth: 04/24/1956  Today's Date: 06/01/2023 OT Individual Time: 1115-1200 OT Individual Time Calculation (min): 45 min    Short Term Goals: Week 1:  OT Short Term Goal 1 (Week 1): STG=LTG d/t ELOS   Skilled Therapeutic Interventions/Progress Updates:    1:1 NMR with focus on right Ue in functional activities. Pt did report fatigue from earlier sessions. Addressed gross grasp and finger manipulation of crumpling paper into a ball and then throwing it over hand (addressing timing and release of item). Transitioned to isolated finger movement, to picking up items to translation of item from finger tips to palm and then back to finger tips with coins; stringing paper clips together with right hand as dominant. Also practiced threading a belt through his jean shorts and fasten button and zipper with using right hand at non-dominant level with extra time. Discussed more exercises he could do on his own to further progress his isolated finger movements. Left resting in the be.d   Therapy Documentation Precautions:  Precautions Precautions: Fall, Sternal Precaution Comments: sternal precautions Restrictions Weight Bearing Restrictions: No RUE Weight Bearing: Non weight bearing LUE Weight Bearing: Non weight bearing Other Position/Activity Restrictions: sternal precautions  Pain:  No c/o pain in session   Therapy/Group: Individual Therapy  Roney Mans [facility] 06/01/2023, 2:23 PM

## 2023-06-01 NOTE — Progress Notes (Deleted)
Patient ID: [name], male   DOB: 1956/02/07, 67 y.o.   MRN: [id number]   SW informed of SNF preferences of   [facility]  [facility]

## 2023-06-01 NOTE — Progress Notes (Signed)
Occupational Therapy Session Note  Patient Details  Name: [name] MRN: [id number] Date of Birth: [birth year]  Today's Date: 06/01/2023 OT Individual Time: 8295-6213 OT Individual Time Calculation (min): 43 min  OT Individual Time: [address] OT Individual Time Calculation (min): 60 min   Short Term Goals: Week 1:  OT Short Term Goal 1 (Week 1): STG=LTG d/t ELOS  Skilled Therapeutic Interventions/Progress Updates:     AM Session: Pt received semi-reclined in bed presenting to be in good spirits receptive to skilled OT session reporting 0/10 pain- OT offering intermittent rest breaks, repositioning, and therapeutic support to optimize participation in therapy session. Pt reporting mild frustration and fatigue upon OT arrival. OT provided therapeutic support and listening with gentle education provided on CVA recovery with Pt receptive.   Pt transitioned supine>EOB with HOB slightly elevated supervision. Pt completed functional mobility to bathroom using RW and transferred to TTB using RW CGA with min verbal cues provided to maintain sternal precautions when reaching for grab bar. Pt doffed shirt in standing while leaning again st wall to increase safety and prevent fall.Education provided on doffing clothing in sitting to increase safety with Pt receptive, however PT requesting to complete in standing to challenge his balance and become more aware of his deficits. Doffed pants from waist in standing with posterior support from wall and doffed from feet while seated on TTB.   Pt able to complete U/LB bathing while seated on TTB using long handled sponge PRN to wash feet and back while maintaining sternal precautions. Pt able to dry self while seated supervision.   Pt completed functional mobility to his room using RW CGA. Pt able to complete UB dressing supervision seated EOB and LB dressing CGA when standing to bring pants to waist. Pt able to don socks and shoes supervision by crossing  legs into figure-four position. Pt reporting he is unable to tie is shoes. Provided education on modified technique with Pt able to tie both shoes EOB with increased amount of time and min verbal cues for technique.   Pt requesting to complete grooming/hygiene tasks sitting sink-side for energy conservation. Pt able to complete oral hygiene using RUE to hold toothbrush and manipulate toothpaste top with supervision.   Pt was left resting in wc with call bell in reach, chair alarm on, and all needs met.    PM Session:  Pt received sitting up in bed presenting to be in good spirits receptive to skilled OT session reporting 0/10 pain- OT offering intermittent rest breaks, repositioning, and therapeutic support to optimize participation in therapy session. Pt dressed and ready for the day upon OT arrival with R AFO and shoes donned. Focus this session BADL retraining, activity tolerance, IADL retraining, FM coordination, and UB strengthening.   Pt requesting to use restroom at beginning of session. Transitioned to EOB supervision with HOB elevated. Sit>stand using RW supervision. Pt completed functional mobility to bathroom using RW and transferred to toilet close supervision. Pt able to complete clothing management and anterior peri-care with supervision following continent void, see flowsheets. Pt completed functional mobility to sink and stood to wash hands while using RW close supervision.   Pt completed functional mobility to therapy gym using RW with CGA.   Engaged Pt in completing simulated medication management using Pill Box assessment in non-standardized manner to assess Pt's readiness to manage medications and to work on [employer] in-hand manipulation skills. Provided education on importance of following directions on medication bottles at d/c to ensure medications are  taken in the correct quantity/dose and on the correct day/time of day as correct timing is crucial to avoid adverse side effects or  other issues. Pt receptive to education and verbalizing understanding. Pt reporting he does not currently use a pill box for his medications, but would like to get one to use upon d/c. Pt also receptive to receiving assistance/supervision from family for task. Pt able to sort medications into correct sections of pill box without errors noted with supervision. Pt did ask OT questions during assessment for reassurance, however able to correctly sort with min questioning cues. Pt then returned "pills" to their correct pill box with min dropping noted. Pt presenting with challenges with FM manipulation using compensatory pinch vs pad to pad pinch d/t decreased digit opposition.   Engaged Pt in completing gentle UB exercises within his sternal precautions to increase overall strength for BADLs and provide increased proprioceptive feedback to his RUE for improved control. Pt completed 2x10 reps seated bicep curls using 5# weighted dowel and single arm combination bicep curl with over head press R/L with 2.5# wrist weight on each arm. OT provided demonstration of each exercise and min verbal cues for Pt to maintain precautions   AFO consultant in/out during session to assess for custom AFO fit. Engaged pt in completing functional mobility to his room for endurance training and to allow opportunity for consultant to observe Pt's gait pattern.   Pt was left resting in recliner with call bell in reach, chair alarm on, and all needs met.    Therapy Documentation Precautions:  Precautions Precautions: Fall, Sternal Precaution Comments: sternal precautions Restrictions Weight Bearing Restrictions: No RUE Weight Bearing: Non weight bearing LUE Weight Bearing: Non weight bearing Other Position/Activity Restrictions: sternal precautions  Therapy/Group: Individual Therapy  Army Fossa 06/01/2023, 8:05 AM

## 2023-06-01 NOTE — Progress Notes (Signed)
ANTICOAGULATION CONSULT NOTE - Follow Up Consult  Pharmacy Consult for warfarin Indication: CVA  Allergies  Allergen Reactions   Alcohol Other (See Comments)    Pts skin gets red. Ex. Pt used alcohol based deodorant and got red things under arms. & Drinks alcohol notices "from heart on up" everything turns red.   Chlorthalidone     Dizziness and Syncopal Episode    Patient Measurements: Height: 5\' 8"  (172.7 cm) Weight: 74.3 kg (163 lb 12.8 oz) IBW/kg (Calculated) : 68.4  Vital Signs: Temp: 98.6 F (37 C) (07/15 0403) Temp Source: Oral (07/15 0403) BP: 97/72 (07/15 0752) Pulse Rate: 83 (07/15 0752)  Labs: Recent Labs    05/30/23 0556 05/31/23 0535 06/01/23 0701  HGB  --   --  9.7*  HCT  --   --  30.7*  PLT  --   --  355  LABPROT 25.8* 25.6* 23.9*  INR 2.3* 2.3* 2.1*  CREATININE  --   --  1.04    Estimated Creatinine Clearance: 66.7 mL/min (by C-G formula based on SCr of 1.04 mg/dL).   Medications:  Scheduled:   amiodarone  200 mg Oral Daily   aspirin EC  81 mg Oral Daily   atorvastatin  80 mg Oral Daily   baclofen  10 mg Oral Daily   bisacodyl  5 mg Oral Q1400   Fe Fum-Vit C-Vit B12-FA  1 capsule Oral QPC breakfast   finasteride  5 mg Oral Daily   melatonin  5 mg Oral QHS   metoprolol tartrate  12.5 mg Oral BID   nutrition supplement (JUVEN)  1 packet Oral BID BM   pantoprazole  40 mg Oral Daily   polyethylene glycol  17 g Oral Daily   warfarin  2 mg Oral q1600   Warfarin - Pharmacist Dosing Inpatient   Does not apply q1600    Assessment: 89 YOM who was initiated on warfarin by TCTS MD on 05/22/23 during inpatient admission and was managed by TCTS MD. Patient has transferred to rehab and as confirmed with TCTS, pharmacy will continue managing warfarin.  Of note, on amiodarone, which can increase sensitivity to warfarin; will monitor closely.   INR today 2.1   Goal of Therapy:  INR 2-3 Monitor platelets by anticoagulation protocol: Yes   Plan:   Warfarin 2 mg po daily  Daily PT/INR. CBC intermittently. Monitor for signs/symptoms of bleeding.   Thank you Okey Regal, PharmD

## 2023-06-01 NOTE — Progress Notes (Signed)
Patient ID: [name], male   DOB: [birth year], 67 y.o.   MRN: [id number]  Rolling Walker ordered through [employer].

## 2023-06-01 NOTE — Progress Notes (Signed)
Occupational Therapy Session Note  Patient Details  Name: [name] MRN: [id number] Date of Birth: 1956/07/23  Today's Date: 06/01/2023 OT Individual Time: 5732-2025 OT Individual Time Calculation (min): 27 min    Short Term Goals: Week 1:  OT Short Term Goal 1 (Week 1): STG=LTG d/t ELOS  Skilled Therapeutic Interventions/Progress Updates:  Pt received sitting in WC for skilled OT session with focus on dynamic balance, functional mobility, and RUE NMR. Pt agreeable to interventions, demonstrating overall pleasant mood. Pt with no reports of pain. OT offering intermediate rest breaks and positioning suggestions throughout session to address potential pain/fatigue and maximize participation/safety in session.   Pt dependent for WC transport to day room for energy conservation. In day room, pt participates in series of dynamic balance activities, noted below: Coordinating BLE to tap on colored dots as cued by therapist (ex: L leg Red).  Taps onto 2 in step, 2x10  Pt performs the above activities with no more than CGA, with one instance of LOB, recovering with same level of assistance.   Session than transition to [facility] focus with use of poker chips. Pt tasked with flipping and picking up chips, holding/translation of chips attempted but patient is unable to perform this session. Pt ambulates from day room>EOB with CGA-Min A + no AD.   Pt remained sitting EOB with all immediate needs met at end of session. Pt continues to be appropriate for skilled OT intervention to promote further functional independence.   Therapy Documentation Precautions:  Precautions Precautions: Fall, Sternal Precaution Comments: sternal precautions Restrictions Weight Bearing Restrictions: No RUE Weight Bearing: Non weight bearing LUE Weight Bearing: Non weight bearing Other Position/Activity Restrictions: sternal precautions   Therapy/Group: Individual Therapy  Lou Cal, OTR/L,  MSOT  06/01/2023, 6:43 AM

## 2023-06-01 NOTE — Progress Notes (Signed)
PROGRESS NOTE   Subjective/Complaints:  No issues overnite or this am reviewed Labwork with pt  Pt had episode of weakness with arm tingling poor appetite yesterday am , had taken melatonin for the first time night before , review of flowsheets show good am BPs,  Sys BP in 90--100 range around 1pm for 3 days, pulse has been stable in the 80s  Gets low dose metoprolol BID Also on baclofen Q am - home med   ROS: Patient denies CP, SOB, N/V/D  Objective:   No results found. Recent Labs    06/01/23 0701  WBC 9.5  HGB 9.7*  HCT 30.7*  PLT 355   Recent Labs    06/01/23 0701  NA 135  K 4.0  CL 104  CO2 21*  GLUCOSE 106*  BUN 24*  CREATININE 1.04  CALCIUM 8.7*     Intake/Output Summary (Last 24 hours) at 06/01/2023 1019 Last data filed at 06/01/2023 0734 Gross per 24 hour  Intake 840 ml  Output --  Net 840 ml        Physical Exam: Vital Signs Blood pressure 97/72, pulse 83, temperature 98.6 F (37 C), temperature source Oral, resp. rate 17, height 5\' 8"  (1.727 m), weight 74.3 kg, SpO2 100%.    General: No acute distress Mood and affect are appropriate Heart: Regular rate and rhythm no rubs murmurs or extra sounds Lungs: Clear to auscultation, breathing unlabored, no rales or wheezes Abdomen: Positive bowel sounds, soft nontender to palpation, nondistended Extremities: No clubbing, cyanosis, or edema Skin: No evidence of breakdown, no evidence of rash    Skin: No evidence of breakdown, no evidence of rash Neurologic: Cranial nerves II through XII intact, motor strength is 5/5 in left deltoid, bicep, tricep, grip, hip flexor, knee extensors, ankle dorsiflexor and plantar flexor  4-/5 strength in the right deltoid, bicep, tricep, grip improving, 4/5 strength in the right hip flexor knee extensor ankle dorsiflexor and plantar flexor.  Sensory exam normal sensation to light touch  in bilateral upper and lower  extremities Musculoskeletal: Full range of motion in all 4 extremities. No joint swelling   Assessment/Plan: 1. Functional deficits which require 3+ hours per day of interdisciplinary therapy in a comprehensive inpatient rehab setting. Physiatrist is providing close team supervision and 24 hour management of active medical problems listed below. Physiatrist and rehab team continue to assess barriers to discharge/monitor patient progress toward functional and medical goals  Care Tool:  Bathing    Body parts bathed by patient: Right arm, Left arm, Chest, Abdomen, Front perineal area, Right upper leg, Left upper leg, Face   Body parts bathed by helper: Buttocks, Right lower leg, Left lower leg     Bathing assist Assist Level: Minimal Assistance - Patient > 75%     Upper Body Dressing/Undressing Upper body dressing   What is the patient wearing?: Pull over shirt    Upper body assist Assist Level: Minimal Assistance - Patient > 75%    Lower Body Dressing/Undressing Lower body dressing      What is the patient wearing?: Pants     Lower body assist Assist for lower body dressing: Moderate Assistance - Patient  50 - 74%     Toileting Toileting    Toileting assist Assist for toileting: Moderate Assistance - Patient 50 - 74% Assistive Device Comment: RW   Transfers Chair/bed transfer  Transfers assist     Chair/bed transfer assist level: Minimal Assistance - Patient > 75%     Locomotion Ambulation   Ambulation assist      Assist level: Minimal Assistance - Patient > 75% Assistive device: Orthosis Max distance: 247ft   Walk 10 feet activity   Assist     Assist level: Minimal Assistance - Patient > 75% Assistive device: No Device, Orthosis   Walk 50 feet activity   Assist    Assist level: Minimal Assistance - Patient > 75% Assistive device: No Device, Orthosis    Walk 150 feet activity   Assist    Assist level: Minimal Assistance - Patient >  75% Assistive device: No Device, Orthosis    Walk 10 feet on uneven surface  activity   Assist     Assist level: Minimal Assistance - Patient > 75% Assistive device: Hand held assist   Wheelchair     Assist Is the patient using a wheelchair?: No             Wheelchair 50 feet with 2 turns activity    Assist            Wheelchair 150 feet activity     Assist          Blood pressure 97/72, pulse 83, temperature 98.6 F (37 C), temperature source Oral, resp. rate 17, height 5\' 8"  (1.727 m), weight 74.3 kg, SpO2 100%.  Medical Problem List and Plan: 1. 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  Primarily has right upper extremity weakness and decreased balance             -patient may shower             -ELOS/Goals: 7/18 mod I goals with PT and OT   -Continue [name] therapies including PT, OT  2.  Antithrombotics: -DVT/anticoagulation:  Pharmaceutical: Coumadin             -antiplatelet therapy: Aspirin 81 mg indefinitely   3. Pain Management. Has long hx of low back pain followed by ortho. Having intermittent radicular-type symptoms in the RLE. Also has had cervical symptoms as well We discussed no elective surgeries recommended for 6 mo post CVA -Tylenol, tramodol, oxycodone as needed             -baclofen 10 mg daily             -consider trial of gabapentin             -encouraging stretching, oob, physical activity to tolerance   -7/14 appears to be comfortable 4. Mood/Behavior/Sleep: LCSW to evaluate and provide emotional support             -antipsychotic agents: n/a   - sleep improved with melatonin 5mg  qhs 5. Neuropsych/cognition: This patient is capable of making decisions on his own behalf.   6. Skin/Wound Care: Routine skin care checks             -remove chest tube sutures on 7/12             -routine sternotomy incision care   7.  Fluids/Electrolytes/Nutrition: Routine Is and Os and follow-up chemistries   8: Hypertension: monitor TID and prn             -  continue Lopressor 12.5 mg Bid--generally controlled Vitals:   06/01/23 0403 06/01/23 0752  BP: 131/88 97/72  Pulse: 86 83  Resp: 17   Temp: 98.6 F (37 C)   SpO2: 100%       9: Hyperlipidemia: continue statin   11: severe aortic stenosis s/p AVR and aortic root replacement 7/01             -sternal precautions, coumadin             -follow-up with Dr. Laneta Simmers -TTE in 6 weeks, SBE prophylaxis    12: postop atrial fibrillation:rate controlled; on Coumadin (pharmacy consult)             -follow INR -continue amiodarone 200mg  daily   13: ABLA: stable; follow-up CBC             -continue oral iron supplementation Recheck mildly improved     Latest Ref Rng & Units 06/01/2023    7:01 AM 05/29/2023    6:05 AM 05/26/2023    5:44 AM  CBC  WBC 4.0 - 10.5 K/uL 9.5  9.2  8.8   Hemoglobin 13.0 - 17.0 g/dL 9.7  9.2  8.5   Hematocrit 39.0 - 52.0 % 30.7  29.2  26.8   Platelets 150 - 400 K/uL 355  278  247       14: azotemia: fluid intake improved ~1025ml /24h       Latest Ref Rng & Units 06/01/2023    7:01 AM 05/26/2023    5:44 AM 05/23/2023    8:15 AM  BMP  Glucose 70 - 99 mg/dL 161  096  045   BUN 8 - 23 mg/dL 24  29  26    Creatinine 0.61 - 1.24 mg/dL 4.09  8.11  9.14   Sodium 135 - 145 mmol/L 135  137  140   Potassium 3.5 - 5.1 mmol/L 4.0  4.3  3.5   Chloride 98 - 111 mmol/L 104  105  99   CO2 22 - 32 mmol/L 21  22  25    Calcium 8.9 - 10.3 mg/dL 8.7  8.5  8.7   Stable   15: GERD: continue PPI   16: ? BPH: continue Proscar 5 mg daily             -monitor voiding patterns  17.  Episode of nausea  arm tingling yesterday am no recurrence , did not take melatonin last noc , no further w/u needed , enc po fluids and monitor for recurrence  LOS: 7 days A FACE TO FACE EVALUATION WAS PERFORMED  Erick Colace 06/01/2023, 10:19 AM

## 2023-06-02 LAB — PROTIME-INR
INR: 2 — ABNORMAL HIGH (ref 0.8–1.2)
Prothrombin Time: 22.7 seconds — ABNORMAL HIGH (ref 11.4–15.2)

## 2023-06-02 NOTE — Plan of Care (Signed)
  Problem: Consults Goal: RH STROKE PATIENT EDUCATION Description: See Patient Education module for education specifics  Outcome: Progressing   Problem: RH SAFETY Goal: RH STG ADHERE TO SAFETY PRECAUTIONS W/ASSISTANCE/DEVICE Description: STG Adhere to Safety Precautions With cues Assistance/Device. Outcome: Progressing   Problem: RH KNOWLEDGE DEFICIT Goal: RH STG INCREASE KNOWLEDGE OF HYPERTENSION Description: Patient and wife will be able to manage HTN with medications and dietary modifications using educational resources independently Outcome: Progressing   Problem: RH KNOWLEDGE DEFICIT Goal: RH STG INCREASE KNOWLEDGE OF STROKE PROPHYLAXIS Description: Patient and wife will be able to manage a-fib and secondary risks with medications and dietary modifications using educational resources independently Outcome: Progressing

## 2023-06-02 NOTE — Progress Notes (Signed)
ANTICOAGULATION CONSULT NOTE - Follow Up Consult  Pharmacy Consult for warfarin Indication: CVA  Allergies  Allergen Reactions   Alcohol Other (See Comments)    Pts skin gets red. Ex. Pt used alcohol based deodorant and got red things under arms. & Drinks alcohol notices "from heart on up" everything turns red.   Chlorthalidone     Dizziness and Syncopal Episode    Patient Measurements: Height: 5\' 8"  (172.7 cm) Weight: 75.3 kg (166 lb 0.1 oz) IBW/kg (Calculated) : 68.4  Vital Signs: Temp: 98.3 F (36.8 C) (07/16 0339) Temp Source: Oral (07/16 0339) BP: 140/85 (07/16 0339) Pulse Rate: 79 (07/16 0339)  Labs: Recent Labs    05/31/23 0535 06/01/23 0701 06/02/23 0440  HGB  --  9.7*  --   HCT  --  30.7*  --   PLT  --  355  --   LABPROT 25.6* 23.9* 22.7*  INR 2.3* 2.1* 2.0*  CREATININE  --  1.04  --     Estimated Creatinine Clearance: 66.7 mL/min (by C-G formula based on SCr of 1.04 mg/dL).   Medications:  Scheduled:   amiodarone  200 mg Oral Daily   aspirin EC  81 mg Oral Daily   atorvastatin  80 mg Oral Daily   baclofen  10 mg Oral Daily   bisacodyl  5 mg Oral Q1400   Fe Fum-Vit C-Vit B12-FA  1 capsule Oral QPC breakfast   finasteride  5 mg Oral Daily   melatonin  5 mg Oral QHS   metoprolol tartrate  12.5 mg Oral BID   nutrition supplement (JUVEN)  1 packet Oral BID BM   pantoprazole  40 mg Oral Daily   polyethylene glycol  17 g Oral Daily   warfarin  2 mg Oral q1600   Warfarin - Pharmacist Dosing Inpatient   Does not apply q1600    Assessment: 21 YOM who was initiated on warfarin by TCTS MD on 05/22/23 during inpatient admission and was managed by TCTS MD. Patient has transferred to rehab and as confirmed with TCTS, pharmacy will continue managing warfarin.  Of note, on amiodarone, which can increase sensitivity to warfarin; will monitor closely.   INR today 2   Goal of Therapy:  INR 2-3 Monitor platelets by anticoagulation protocol: Yes   Plan:   Warfarin 2 mg po daily  INR to MWF Monitor for signs/symptoms of bleeding.   Thank you Okey Regal, PharmD

## 2023-06-02 NOTE — Progress Notes (Signed)
Patient ID: [name], male   DOB: 1956-03-11, 67 y.o.   MRN: [id number]  TTB ordered through [employer].

## 2023-06-02 NOTE — Progress Notes (Signed)
Patient ID: [name], male   DOB: 23-Sep-1956, 67 y.o.   MRN: [id number]  OP referral faxed to [facility].

## 2023-06-02 NOTE — Progress Notes (Signed)
Physical Therapy Session Note  Patient Details  Name: [name] MRN: [id number] Date of Birth: 06/30/1956  Today's Date: 06/02/2023 PT Individual Time: 8657-8469 PT Individual Time Calculation (min): 28 min   Short Term Goals: Week 1:  PT Short Term Goal 1 (Week 1): = to LTGs based on ELOS  Skilled Therapeutic Interventions/Progress Updates:      Pt supine in bed upon arrival. Pt agreeable to therapy. Pt denies any pain.   Discussed what pt would like to work on during sesison. Pt reports feeling very stiff after ambulation and stair training for morning family training. Requesting to do moto med to reduce stiffness.   Pt wearing shoes and R LE AFO. Pt performed supine to sit with supervision. Pt performed sit<>stand with no AD and CGA, verbal cues provided for LE positioning. Pt ambulated from room to day room  with no AD and CGA, verbal cues provided for hip and knee flexion for R LE advancement versus circumduction compensation. Ambulatory transfer to motomed with CGA, verbal cues provided for UE positioning, therapist assisted with placement of R LE on foot plates.   Pt performed 10 minutes total on motor med at workload 3 for 5 min and workload 4 for 5 min, verbal cues provided for technique and keeping hip in neutral position versus abducted. Pt completed total of Therapist assisting with LE position on foot plate throughout session. Verbal cues provided to for diaphragmatic breathing, to not hold breath.   Pt ambulated 2 laps around day room and back to room with no AD and min A, pt demos improved knee and hip flexion, but still requiring intermittent verbal cues with fatigue. Pt performed ambulatory transfer to bed with +1 CGA A, verbal cues provided for UE positioning.   Pt supine in bed with all needs within reach and bed alarm on.       Therapy Documentation Precautions:  Precautions Precautions: Fall, Sternal Precaution Comments: sternal  precautions Restrictions Weight Bearing Restrictions: No RUE Weight Bearing: Non weight bearing LUE Weight Bearing: Non weight bearing Other Position/Activity Restrictions: sternal precautions   Therapy/Group: Individual Therapy  [facility], , DPT  06/02/2023, 2:09 PM

## 2023-06-02 NOTE — Progress Notes (Signed)
PROGRESS NOTE   Subjective/Complaints:  INRs excellent in 2.0-2.5 range over the last week Discussed d/c process  Poor sleep , worrying about grandchildren   ROS: Patient denies CP, SOB, N/V/D  Objective:   No results found. Recent Labs    06/01/23 0701  WBC 9.5  HGB 9.7*  HCT 30.7*  PLT 355   Recent Labs    06/01/23 0701  NA 135  K 4.0  CL 104  CO2 21*  GLUCOSE 106*  BUN 24*  CREATININE 1.04  CALCIUM 8.7*     Intake/Output Summary (Last 24 hours) at 06/02/2023 4098 Last data filed at 06/02/2023 1191 Gross per 24 hour  Intake 420 ml  Output --  Net 420 ml        Physical Exam: Vital Signs Blood pressure (!) 140/85, pulse 79, temperature 98.3 F (36.8 C), temperature source Oral, resp. rate 17, height 5\' 8"  (1.727 m), weight 75.3 kg, SpO2 100%.    General: No acute distress Mood and affect are appropriate Heart: Regular rate and rhythm no rubs murmurs or extra sounds Lungs: Clear to auscultation, breathing unlabored, no rales or wheezes Abdomen: Positive bowel sounds, soft nontender to palpation, nondistended Extremities: No clubbing, cyanosis, or edema Skin: No evidence of breakdown, no evidence of rash    Skin: No evidence of breakdown, no evidence of rash Neurologic: Cranial nerves II through XII intact, motor strength is 5/5 in left deltoid, bicep, tricep, grip, hip flexor, knee extensors, ankle dorsiflexor and plantar flexor  4-/5 strength in the right deltoid, bicep, tricep, grip improving, 4/5 strength in the right hip flexor knee extensor ankle dorsiflexor and plantar flexor.  Sensory exam normal sensation to light touch  in bilateral upper and lower extremities Musculoskeletal: Full range of motion in all 4 extremities. No joint swelling   Assessment/Plan: 1. Functional deficits which require 3+ hours per day of interdisciplinary therapy in a comprehensive inpatient rehab  setting. Physiatrist is providing close team supervision and 24 hour management of active medical problems listed below. Physiatrist and rehab team continue to assess barriers to discharge/monitor patient progress toward functional and medical goals  Care Tool:  Bathing    Body parts bathed by patient: Right arm, Left arm, Chest, Abdomen, Front perineal area, Right upper leg, Left upper leg, Face   Body parts bathed by helper: Buttocks, Right lower leg, Left lower leg     Bathing assist Assist Level: Minimal Assistance - Patient > 75%     Upper Body Dressing/Undressing Upper body dressing   What is the patient wearing?: Pull over shirt    Upper body assist Assist Level: Minimal Assistance - Patient > 75%    Lower Body Dressing/Undressing Lower body dressing      What is the patient wearing?: Pants     Lower body assist Assist for lower body dressing: Moderate Assistance - Patient 50 - 74%     Toileting Toileting    Toileting assist Assist for toileting: Moderate Assistance - Patient 50 - 74% Assistive Device Comment: RW   Transfers Chair/bed transfer  Transfers assist     Chair/bed transfer assist level: Minimal Assistance - Patient > 75%  Locomotion Ambulation   Ambulation assist      Assist level: Minimal Assistance - Patient > 75% Assistive device: Orthosis Max distance: 271ft   Walk 10 feet activity   Assist     Assist level: Minimal Assistance - Patient > 75% Assistive device: No Device, Orthosis   Walk 50 feet activity   Assist    Assist level: Minimal Assistance - Patient > 75% Assistive device: No Device, Orthosis    Walk 150 feet activity   Assist    Assist level: Minimal Assistance - Patient > 75% Assistive device: No Device, Orthosis    Walk 10 feet on uneven surface  activity   Assist     Assist level: Minimal Assistance - Patient > 75% Assistive device: Hand held assist   Wheelchair     Assist Is the  patient using a wheelchair?: No             Wheelchair 50 feet with 2 turns activity    Assist            Wheelchair 150 feet activity     Assist          Blood pressure (!) 140/85, pulse 79, temperature 98.3 F (36.8 C), temperature source Oral, resp. rate 17, height 5\' 8"  (1.727 m), weight 75.3 kg, SpO2 100%.  Medical Problem List and Plan: 1. 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  Primarily has right upper extremity weakness and decreased balance             -patient may shower             -ELOS/Goals: 7/18 mod I goals with PT and OT   -Continue [name] therapies including PT, OT  2.  Antithrombotics: -DVT/anticoagulation:  Pharmaceutical: Coumadin- can go to weekly as outpt              -antiplatelet therapy: Aspirin 81 mg indefinitely   3. Pain Management. Has long hx of low back pain followed by ortho. Having intermittent radicular-type symptoms in the RLE. Also has had cervical symptoms as well We discussed no elective surgeries recommended for 6 mo post CVA -Tylenol, tramodol, oxycodone as needed             -baclofen 10 mg daily             -consider trial of gabapentin             -encouraging stretching, oob, physical activity to tolerance   -7/14 appears to be comfortable 4. Mood/Behavior/Sleep: LCSW to evaluate and provide emotional support             -antipsychotic agents: n/a   - sleep improved with melatonin 5mg  qhs 5. Neuropsych/cognition: This patient is capable of making decisions on his own behalf.   6. Skin/Wound Care: Routine skin care checks             -remove chest tube sutures on 7/12             -routine sternotomy incision care   7. Fluids/Electrolytes/Nutrition: Routine Is and Os and follow-up chemistries   8: Hypertension: monitor TID and prn             -continue Lopressor 12.5 mg Bid--generally controlled, HR normal  Vitals:   06/01/23 2104  06/02/23 0339  BP: 125/83 (!) 140/85  Pulse: 87 79  Resp: 17 17  Temp: 98.4 F (36.9 C) 98.3 F (36.8 C)  SpO2: 100% 100%      9: Hyperlipidemia: continue statin   11: severe aortic stenosis s/p AVR and aortic root replacement 7/01             -sternal precautions, coumadin             -follow-up with Dr. Laneta Simmers -TTE in 6 weeks, SBE prophylaxis    12: postop atrial fibrillation:rate controlled; on Coumadin (pharmacy consult)             -follow INR -continue amiodarone 200mg  daily   13: ABLA: stable; follow-up CBC             -continue oral iron supplementation Recheck mildly improved     Latest Ref Rng & Units 06/01/2023    7:01 AM 05/29/2023    6:05 AM 05/26/2023    5:44 AM  CBC  WBC 4.0 - 10.5 K/uL 9.5  9.2  8.8   Hemoglobin 13.0 - 17.0 g/dL 9.7  9.2  8.5   Hematocrit 39.0 - 52.0 % 30.7  29.2  26.8   Platelets 150 - 400 K/uL 355  278  247       14: azotemia: fluid intake improved ~104ml /24h       Latest Ref Rng & Units 06/01/2023    7:01 AM 05/26/2023    5:44 AM 05/23/2023    8:15 AM  BMP  Glucose 70 - 99 mg/dL 578  469  629   BUN 8 - 23 mg/dL 24  29  26    Creatinine 0.61 - 1.24 mg/dL 5.28  4.13  2.44   Sodium 135 - 145 mmol/L 135  137  140   Potassium 3.5 - 5.1 mmol/L 4.0  4.3  3.5   Chloride 98 - 111 mmol/L 104  105  99   CO2 22 - 32 mmol/L 21  22  25    Calcium 8.9 - 10.3 mg/dL 8.7  8.5  8.7   Stable   15: GERD: continue PPI   16: ? BPH: continue Proscar 5 mg daily             -monitor voiding patterns  17.  Episode of nausea  arm tingling yesterday am no recurrence , did not take melatonin last noc , no further w/u needed , enc po fluids and monitor for recurrence  LOS: 8 days A FACE TO FACE EVALUATION WAS PERFORMED  Victorino Sparrow Ahmoni Edge 06/02/2023, 8:21 AM

## 2023-06-02 NOTE — Progress Notes (Signed)
Physical Therapy Session Note  Patient Details  Name: [name] MRN: [id number] Date of Birth: 1956-06-07  Today's Date: 06/02/2023 PT Individual Time: [phone] and 1540-1650 PT Individual Time Calculation (min): 55 min and 70 min   Short Term Goals: Week 1:  PT Short Term Goal 1 (Week 1): = to LTGs based on ELOS  Skilled Therapeutic Interventions/Progress Updates:    Session 1: Pt received supine in bed awake with his son, Dannielle Huh, and his son's significant other, Brooke, present and pt agreeable to therapy session. Supine>sitting R EOB, HOB flat and not using bedrail, with supervision progressing to mod-I. Sit<>stands using RW with supervision for safety during session.  Educated family on the following:  - sternal precautions - fall risk safety including need to wear shoes with brace, picking up rugs, and night lights - pt's CLOF and use of RW for all standing/ambulation - AFO education on wear schedule, skin assessments, and purpose - educated on PRAFO use at night to support ankle into DF - discussed recommendation to receive toe cap due to poor R foot clearance, especially with fatigue - CPO made aware - follow-up OPPT recommendation to continue addressing gait and balance deficits - need for 24hr support upon D/C - proper use of gait belt for guarding during mobility  Pt ambulated ~160ft to main therapy gym using RW with therapist providing close SBA on pt's R side for guarding/safety - intermittent cuing for improved R hip/knee flexion during swing for foot clearance. Pt's family comments on improvement in pt's gait mechanics wearing AFO compared to his baseline gait mechanics without AFO. Family demonstrates proper guarding technique for gait during ambulation back to room at end of session.  Educated on proper stair navigation technique with family providing assistance for AD management. Pt ascends/descends 4 steps x3 reps (6" height) using L UE support on each HR to  simulate home environment with CGA for safety to educate family - family demonstrates proper guarding technique and pt demos excellent stair navigation technique.   Discussed alternative home entry with only 1 step-up and therapist demonstrated proper technique to navigate 1 step using RW - pt returned understanding of education with his son providing proper CGA.  Pt/family report no additional questions/concerns at this time and report feeling confident with all hands-on training.  At end of session, pt left seated in recliner with needs in reach and family present.     Session 2: Pt received supine in bed, awake and agreeable to therapy session. Pt asking about sleep apnea and therapist instructed pt to speak with MD regarding this. Supine>sitting R EOB, HOB partially elevated and using bedrail, with supervision/mod-I. Pt already wearing B LE shoes and R LE [place] Walk-on PLS AFO. Sit<>stands using RW with close supervision - does well maintaining sternal precautions. Notified nursing staff and pt participated in gait training ~218ft down to outside using RW with close SBA progressed to CGA, including navigating on/off elevators - pt continues to have inconsistent R LE swing phase gait mechanics varying between circumduction compensation and increased R LE hip/knee flexion with intermittent R toe dragging during.  Gait training ~181ft outside on unlevel, paved surfaces using RW with CGA assist for steadying/balance  - pt demos good safety awareness to adjust gait speed to ensure his stability - continues to have varying R LE swing phase gait mechanics as described above.  Stair navigation training ascending/descending 8 outdoor steps (6" height) using L UE support on each HR with CGA for steadying/safety -  step-to pattern in each direction leading with L LE both ways with good stability.   Gait training outdoor on grass using RW to prepare for possible home entry options requiring pt to walk  through grass - has to perform step-to pattern with AD to ensure his safety, but demos good technique with CGA for steadying/safety.  Pt continues to confide in therapist about his feelings of inability to perform tasks independently and his limitations with poor endurance - therapist provides encouragement and education on stroke related fatigue. Educated pt on stroke support group for support after D/C. Pt also seems to be having difficulty coping with overall decline in his mobility over the last 2 years.  Gait training back up to [name] using RW as described above.  Dynamic stepping balance and R UE NMR task of grasping clothespins from a table using R UE and then stepping over 6" hurdle wearing 4lb ankle weight on R LE to then place clothespins on basketball netting - requires min assist for balance throughout and cuing to weight shift onto stance limb - removed 4lb ankle weight for final 2 reps with improvement in R LE foot clearance and step length; otherwise, pt requires 2 steps to completely clear the hurdle with difficulty.   Gait training back to his room using RW with close guarding on R side with occasional CGA for safety due to pt fatigue. Pt left supine in bed with needs in reach and bed alarm on.   Therapy Documentation Precautions:  Precautions Precautions: Fall, Sternal Precaution Comments: sternal precautions Restrictions Weight Bearing Restrictions: No RUE Weight Bearing: Non weight bearing LUE Weight Bearing: Non weight bearing Other Position/Activity Restrictions: sternal precautions   Pain:  Session 1: No reports of pain throughout session.  Session 2: No reports of pain throughout session.    Therapy/Group: Individual Therapy  Ginny Forth , PT, DPT, NCS, CSRS 06/02/2023, 7:58 AM

## 2023-06-02 NOTE — Progress Notes (Signed)
Occupational Therapy Session Note  Patient Details  Name: [name] MRN: [id number] Date of Birth: 12-21-1955  Today's Date: 06/02/2023 OT Individual Time: 1103-1202 OT Individual Time Calculation (min): 59 min    Short Term Goals: Week 1:  OT Short Term Goal 1 (Week 1): STG=LTG d/t ELOS  Skilled Therapeutic Interventions/Progress Updates:     Pt received sitting up in wc presenting to be in good spirits receptive to skilled OT session reporting 0/10 pain- OT offering intermittent rest breaks, repositioning, and therapeutic support to optimize participation in therapy session. Pt's son and son's fiance present and attentive throughout session for family education focused session. Education provided on energy conservation, fall prevention, CVA etiology/recovery, and simple home modifications to increase Pt's safety and independence. Engaged Pt and Pt's family members in discussion related to these topics with all individuals able to verbalize understanding and provide examples of how/when to implement strategies at home. Educated on sternal precautions and impact on functional status including transfer and BADLs. Provided opportunity for Pt's family to assist Pt through completing toileting, dressing, and bathing tasks with all members demonstrating competency through teach back. Educated Pt and caregivers on safety considerations and body mechanics for completing sit<>stands, TTB transfers, and toilet transfers with opportunity to practice provided following and OT providing skilled feedback on technique. Pt's family was able to safety assist and cue Pt through completing functional mobility and transfers and Pt was able to verbalize need for rest breaks throughout session. Education provided on follow up therapy and DME recommendations. Pt and Pt's caregivers receptive to all education provided and reporting all questions were met at end of session. Pt was left resting in c with call bell in  reach, chair alarm on, and all needs met.    Therapy Documentation Precautions:  Precautions Precautions: Fall, Sternal Precaution Comments: sternal precautions Restrictions Weight Bearing Restrictions: No RUE Weight Bearing: Non weight bearing LUE Weight Bearing: Non weight bearing Other Position/Activity Restrictions: sternal precautions  Therapy/Group: Individual Therapy  Army Fossa 06/02/2023, 11:53 AM

## 2023-06-03 ENCOUNTER — Inpatient Hospital Stay (HOSPITAL_COMMUNITY): Payer: Medicare Other

## 2023-06-03 LAB — PROTIME-INR
INR: 1.9 — ABNORMAL HIGH (ref 0.8–1.2)
Prothrombin Time: 22.3 seconds — ABNORMAL HIGH (ref 11.4–15.2)

## 2023-06-03 MED ORDER — WARFARIN SODIUM 3 MG PO TABS
3.0000 mg | ORAL_TABLET | Freq: Once | ORAL | Status: AC
  Administered 2023-06-03: 3 mg via ORAL
  Filled 2023-06-03: qty 1

## 2023-06-03 MED ORDER — WARFARIN VIDEO: Freq: Once | Status: DC

## 2023-06-03 NOTE — Progress Notes (Signed)
Orthopedic Tech Progress Note Patient Details:  [name] 19-May-1956 [id number] AFO Consult has been ordered from [facility]  Patient ID: [name], male   DOB: 02-12-1956, 67 y.o.   MRN: [id number]  Smitty Pluck 06/03/2023, 9:43 AM

## 2023-06-03 NOTE — Progress Notes (Signed)
Physical Therapy Session Note  Patient Details  Name: [name] MRN: [id number] Date of Birth: 1956-09-21  Today's Date: 06/03/2023 PT Individual Time: 0920-1033 PT Individual Time Calculation (min): 73 min   Short Term Goals: Week 1:  PT Short Term Goal 1 (Week 1): = to LTGs based on ELOS  Skilled Therapeutic Interventions/Progress Updates:    Pt received sitting in w/c and agreeable to therapy session. Pt reports having another restless night last night with poor sleep.Olivia, CPO present to drop off pt's R LE [place] Walk-on PLS AFO and shoe with toe cover - donned total assist for time.  MD in/out for morning assessment - pt able to inquire about breathing concerns.  Sit<>stands using RW with supervision throughout session - pt maintaining sternal precautions without cuing.  Gait training ~147ft to main therapy gym using RW with supervision for safety and pt reports his personal AFO and toe cap feel good during gait - pt demos increasing consistency in R hip/knee flexion during swing and utilizing brace and toe cap to improve swing phase advancement safely. Although, pt still wants to have slight L lateral trunk lean compensating with bias to want to perform circumduction due to hip flexion weakness.   Stair navigation training ascending/descending 12 steps (6" height) using L UE support on each HR with supervision for safety - step-to pattern leading with L LE in both directions. HR 101bpm after with pt reporting feeling min labored breathing with this more intensive activity  Pt participated in 1 set of each of the following exercises to ensure proper technique and safe set-up with education and cuing from therapist. Provided pt with a printout for HEP.   Access Code: [email or web address] URL: [email or web address] Date: 06/03/2023 Prepared by: Casimiro Needle  Exercises - Walking  - 1 x daily - 7 x weekly - 2 sets - working up to 5 minutes hold - Standing Forward Step Taps  with Counter Support  - 1 x daily - 7 x weekly - 2 sets - 10-20 reps - Sit to Stand with Arms Crossed  - 1 x daily - 7 x weekly - 2 sets - 10 reps - Side Step Overs with Cones and Counter Support  - 1 x daily - 7 x weekly - 2 sets - 10 reps - Forward and Backward Step Over with Counter Support  - 1 x daily - 7 x weekly - 2 sets - 10 reps - Standing Knee Flexion  - 1 x daily - 7 x weekly - 2 sets - 10-15 reps  Pt continues to require frequent seated rest breaks throughout session after each activity due to endurance deficits. Gait training back to room using RW with supervision and intermittent cuing to improve R LE flexion during swing.   Pt left supine in bed with needs in reach and bed alarm on.   Therapy Documentation Precautions:  Precautions Precautions: Fall, Sternal Precaution Comments: sternal precautions Restrictions Weight Bearing Restrictions: No RUE Weight Bearing: Non weight bearing LUE Weight Bearing: Non weight bearing Other Position/Activity Restrictions: sternal precautions   Pain:  Denies pain during session.    Therapy/Group: Individual Therapy  Ginny Forth , PT, DPT, NCS, CSRS 06/03/2023, 8:01 AM

## 2023-06-03 NOTE — Plan of Care (Signed)
  Problem: RH Balance Goal: LTG Patient will maintain dynamic standing with ADLs (OT) Description: LTG:  Patient will maintain dynamic standing balance with assist during activities of daily living (OT)  Outcome: Completed/Met   Problem: RH Bathing Goal: LTG Patient will bathe all body parts with assist levels (OT) Description: LTG: Patient will bathe all body parts with assist levels (OT) Outcome: Completed/Met   Problem: RH Dressing Goal: LTG Patient will perform upper body dressing (OT) Description: LTG Patient will perform upper body dressing with assist, with/without cues (OT). Outcome: Completed/Met Goal: LTG Patient will perform lower body dressing w/assist (OT) Description: LTG: Patient will perform lower body dressing with assist, with/without cues in positioning using equipment (OT) Outcome: Completed/Met   Problem: RH Toileting Goal: LTG Patient will perform toileting task (3/3 steps) with assistance level (OT) Description: LTG: Patient will perform toileting task (3/3 steps) with assistance level (OT)  Outcome: Completed/Met   Problem: RH Functional Use of Upper Extremity Goal: LTG Patient will use RT/LT upper extremity as a (OT) Description: LTG: Patient will use right/left upper extremity as a stabilizer/gross assist/diminished/nondominant/dominant level with assist, with/without cues during functional activity (OT) Outcome: Completed/Met   Problem: RH Simple Meal Prep Goal: LTG Patient will perform simple meal prep w/assist (OT) Description: LTG: Patient will perform simple meal prep with assistance, with/without cues (OT). Outcome: Completed/Met   Problem: RH Toilet Transfers Goal: LTG Patient will perform toilet transfers w/assist (OT) Description: LTG: Patient will perform toilet transfers with assist, with/without cues using equipment (OT) Outcome: Completed/Met   Problem: RH Tub/Shower Transfers Goal: LTG Patient will perform tub/shower transfers w/assist  (OT) Description: LTG: Patient will perform tub/shower transfers with assist, with/without cues using equipment (OT) Outcome: Completed/Met

## 2023-06-03 NOTE — Plan of Care (Signed)
  Problem: Consults Goal: RH STROKE PATIENT EDUCATION Description: See Patient Education module for education specifics  Outcome: Progressing   Problem: RH SAFETY Goal: RH STG ADHERE TO SAFETY PRECAUTIONS W/ASSISTANCE/DEVICE Description: STG Adhere to Safety Precautions With cues Assistance/Device. Outcome: Progressing   Problem: RH KNOWLEDGE DEFICIT Goal: RH STG INCREASE KNOWLEDGE OF HYPERTENSION Description: Patient and wife will be able to manage HTN with medications and dietary modifications using educational resources independently Outcome: Progressing Goal: RH STG INCREASE KNOWLEGDE OF HYPERLIPIDEMIA Description: Patient and wife will be able to manage HLD with medications and dietary modifications using educational resources independently Outcome: Progressing Goal: RH STG INCREASE KNOWLEDGE OF STROKE PROPHYLAXIS Description: Patient and wife will be able to manage a-fib and secondary risks with medications and dietary modifications using educational resources independently Outcome: Progressing

## 2023-06-03 NOTE — Progress Notes (Signed)
ANTICOAGULATION CONSULT NOTE - Follow Up Consult  Pharmacy Consult for warfarin Indication: CVA  Allergies  Allergen Reactions   Alcohol Other (See Comments)    Pts skin gets red. Ex. Pt used alcohol based deodorant and got red things under arms. & Drinks alcohol notices "from heart on up" everything turns red.   Chlorthalidone     Dizziness and Syncopal Episode    Patient Measurements: Height: 5\' 8"  (172.7 cm) Weight: 75.3 kg (166 lb 0.1 oz) IBW/kg (Calculated) : 68.4  Vital Signs: Temp: 98.3 F (36.8 C) (07/17 0358) BP: 130/90 (07/17 0358) Pulse Rate: 76 (07/17 0358)  Labs: Recent Labs    06/01/23 0701 06/02/23 0440 06/03/23 0618  HGB 9.7*  --   --   HCT 30.7*  --   --   PLT 355  --   --   LABPROT 23.9* 22.7* 22.3*  INR 2.1* 2.0* 1.9*  CREATININE 1.04  --   --     Estimated Creatinine Clearance: 66.7 mL/min (by C-G formula based on SCr of 1.04 mg/dL).   Medications:  Scheduled:   amiodarone  200 mg Oral Daily   aspirin EC  81 mg Oral Daily   atorvastatin  80 mg Oral Daily   baclofen  10 mg Oral Daily   bisacodyl  5 mg Oral Q1400   Fe Fum-Vit C-Vit B12-FA  1 capsule Oral QPC breakfast   finasteride  5 mg Oral Daily   melatonin  5 mg Oral QHS   metoprolol tartrate  12.5 mg Oral BID   nutrition supplement (JUVEN)  1 packet Oral BID BM   pantoprazole  40 mg Oral Daily   polyethylene glycol  17 g Oral Daily   warfarin  3 mg Oral Once   Warfarin - Pharmacist Dosing Inpatient   Does not apply q1600    Assessment: 72 YOM who was initiated on warfarin by TCTS MD on 05/22/23 during inpatient admission and was managed by TCTS MD. Patient has transferred to rehab and as confirmed with TCTS, pharmacy will continue managing warfarin.  Of note, on amiodarone, which can increase sensitivity to warfarin; will monitor closely.   INR today 1.9   Goal of Therapy:  INR 2-3 Monitor platelets by anticoagulation protocol: Yes   Plan:  Warfarin 3 mg po x 1 now Monitor  INR, CBC, s/sx of bleed, PO intake/diet, Drug-Drug Interactions   Thank you for allowing pharmacy to be a part of this patient's care.   Signe Colt, PharmD 06/03/2023 8:14 AM  **Pharmacist phone directory can be found on [email or web address] listed under [facility]**

## 2023-06-03 NOTE — Patient Care Conference (Signed)
Inpatient RehabilitationTeam Conference and Plan of Care Update Date: 06/03/2023   Time: 10:43 AM    Patient Name: [name]      Medical Record Number: [id number]  Date of Birth: [birth year] Sex: Male         Room/Bed: 4W12C/4W12C-02 Payor Info: Payor: MEDICARE / Plan: MEDICARE PART A AND B / Product Type: *No Product type* /    Admit Date/Time:  05/25/2023  4:44 PM  Primary Diagnosis:  CVA (cerebral vascular accident) [facility])  Hospital Problems: Principal Problem:   CVA (cerebral vascular accident) [facility])    Expected Discharge Date: Expected Discharge Date: 06/04/23  Team Members Present: Physician leading conference: Dr. Claudette Laws Social Worker Present: Lavera Guise, BSW Nurse Present: Chana Bode, RN PT Present: Casimiro Needle, PT OT Present: Bonnell Public, OT SLP Present: Everardo Pacific, SLP PPS Coordinator present : Fae Pippin, SLP     Current Status/Progress Goal Weekly Team Focus  Bowel/Bladder   Patient is continent of bladder and bowel. LBM 06/02/23   Mainain continence of bladder/bowel   Assess/address toileting needs QS and prn    Swallow/Nutrition/ Hydration               ADL's   Supervision overall for U/LB BADLs and simple IADL tasksl; superivsion function toilet and shower transfers using RW   superivsion   BADL retraining, IADL retraining, functional transfer training, Pt and caregiver education, DME education, activity tolerance, R NMR, fall prevention/energy conservation education    Mobility   supervision/mod-I bed mobility, supervision sit<>stand and stand pivot transfers using RW, supervision gait >265ft using RW and weareing R LE AFO, supervision 12 stairs using HR support - AFO consult to address improved R LE swing phase gait mechanics to decrease fall risk   supervision overall at ambulatory level  performed hands-on family education/training with son and son's fiance on 7/16 with both demonstrating understanding; continues  to have impaired R LE gait deviations and is at an increased risk for falls    Communication                Safety/Cognition/ Behavioral Observations               Pain   Patient will remain painfree   Keep discomfort or pain with   Continue to assess pain QS/PRN    Skin   s/p surgical midline incision open to air, no signs of infection or irritation to surgical site.   Prevention of infection to surgical chest area,  QS/PRN assessment to surgical incisions and skin      Discharge Planning:  Discharging home on Thursday. DME ordered through [employer]. OP referral submitted to [place] [facility].   Team Discussion: Patient continues to be winded with walking and had an episode of shortness of breath upon awakening.   Patient on target to meet rehab goals: yes, currently needs supervision for ADLs and completing simple IADLs.  *See Care Plan and progress notes for long and short-term goals.   Revisions to Treatment Plan:  CXR to r/o post off effusion Coumadin education  AFO  Teaching Needs: Safety, medications, dietary modifications, skin care, etc.   Current Barriers to Discharge: Decreased caregiver support and Home enviroment access/layout  Possible Resolutions to Barriers: Family education OP follow up services DME: TTB and RW     Medical Summary Current Status: BP control improved, post op pain control is good, wound healing is good  Barriers to Discharge: Other (comments)  Barriers to Discharge Comments:  Sternal precautions Possible Resolutions to [employer] Focus: cont with pt and caregiver ed for d/c in am   Continued Need for Acute Rehabilitation Level of Care: The patient requires daily medical management by a physician with specialized training in physical medicine and rehabilitation for the following reasons: Direction of a multidisciplinary physical rehabilitation program to maximize functional independence : Yes Medical management of patient  stability for increased activity during participation in an intensive rehabilitation regime.: Yes Analysis of laboratory values and/or radiology reports with any subsequent need for medication adjustment and/or medical intervention. : Yes   I attest that I was present, lead the team conference, and concur with the assessment and plan of the team.   Chana Bode B 06/03/2023, 1:59 PM

## 2023-06-03 NOTE — Discharge Summary (Signed)
Physical Therapy Discharge Summary  Patient Details  Name: [name] MRN: [id number] Date of Birth: November 14, 1956  Date of Discharge from PT service:June 03, 2023  Today's Date: 06/03/2023 PT Individual Time: 6387-5643 PT Individual Time Calculation (min): 29 min    Patient has met 9 of 9 long term goals due to improved activity tolerance, improved balance, improved postural control, increased strength, ability to compensate for deficits, and functional use of  right upper extremity and right lower extremity.  Patient to discharge at an ambulatory level Supervision using RW and wearing R LE PLS AFO. Patient's care partners attended hands-on education/training and are independent to provide the necessary physical assistance at discharge.  All goals met.  Recommendation:  Patient will benefit from ongoing skilled PT services in outpatient setting to continue to advance safe functional mobility, address ongoing impairments in R LE gait deviations, cardiopulmonary endurance, dynamic gait and balance activities, community level reintegration, and minimize fall risk.  Equipment: RW and R LE [place] Walk-on PLS AFO  Reasons for discharge: treatment goals met and discharge from hospital  Patient/family agrees with progress made and goals achieved: Yes  Skilled Therapeutic Interventions/Progress Updates:  Pt received supine in bed awake and agreeable to therapy session. Supine>sitting L EOB, mod-I. Pt already wearing B LE shoes (with R toe cap) and R LE [place] Walk-on PLS AFO. Sit<>stands using RW with supervision during session. Gait training ~130-140ft x2 to/from gym using RW with close supervision for safety - continues to have gradual worsening of R LE hip/knee flexion during swing resulting in circumduction compensation and poor foot clearance with toe drag when fatigued - cuing throughout for improvement.  Applied R LE functional electrical stimulation (FES) to hamstrings and anterior  tibialis muscles during gait training using Empi e-stim device set on NMES L with trigger switch to time activation of stimulation during swing of gait - intensity 42 lowered to 37 for hamstrings (then turned off) and 28 for anterior tibialis with visible muscle contraction - after completion of e-stim, pt denies any negative side effects with skin intact and no adverse side effects noted.  Gait training ~36ft, no UE support, while using FES as described above to facilitate increased hamstring and ankle DF muscle activation during swing to improve foot clearance - had to decrease and then stop hamstring FES half way through due to pt reporting it was distracted him and making him less likely to flex his knee during swing vs increasing his ability to do this - pt reports feeling the DF activation was improving his gait - requires heavy min assist in the beginning of gait while pt adjusting to FES stimulation progressed to light min assist towards end.  At end of session, pt left seated on toilet with NT arriving to assume care of pt.   PT Discharge Precautions/Restrictions Precautions Precautions: Fall;Sternal Restrictions Weight Bearing Restrictions: No Pain Pain Assessment Pain Scale: 0-10 Pain Score: 0-No pain Pain Interference Pain Interference Pain Effect on Sleep: 0. Does not apply - I have not had any pain or hurting in the past 5 days Pain Interference with Therapy Activities: 1. Rarely or not at all Pain Interference with Day-to-Day Activities: 1. Rarely or not at all Vision/Perception  Vision - History Ability to See in Adequate Light: 0 Adequate Perception Perception: Within Functional Limits Praxis Praxis: Intact  Cognition Overall Cognitive Status: Within Functional Limits for tasks assessed Arousal/Alertness: Awake/alert Orientation Level: Oriented X4 Year: 2024 Month: July Day of Week: Correct Attention: Focused;Sustained Focused  Attention: Appears  intact Sustained Attention: Appears intact Memory: Appears intact Awareness: Appears intact Problem Solving: Appears intact Safety/Judgment: Appears intact Sensation Sensation Light Touch: Impaired Detail Peripheral sensation comments: able to sense light touch, but reports tingling in R calf, and states the numbness in B LEs occurs when he is experiencing back pain (onset approximately 2 yrs ago) Light Touch Impaired Details: Impaired RLE;Impaired LLE Hot/Cold: Not tested Proprioception: Impaired by gross assessment (in R LE) Stereognosis: Not tested Coordination Gross Motor Movements are Fluid and Coordinated: No Coordination and Movement Description: impaired GM movements in R LE due to paresis, unbalanced muscle activation, and change in tone but improved since eval Motor  Motor Motor: Hemiplegia;Abnormal tone Motor - Discharge Observations: continues with mild R hemiparesis on top of baseline R LE impairments but improved since initial eval  Mobility Bed Mobility Bed Mobility: Sit to Supine;Supine to Sit Supine to Sit: Independent with assistive device Sit to Supine: Independent with assistive device Transfers Transfers: Sit to Stand;Stand to Sit;Stand Pivot Transfers Sit to Stand: Supervision/Verbal cueing Stand to Sit: Supervision/Verbal cueing Stand Pivot Transfers: Supervision/Verbal cueing Stand Pivot Transfer Details: Verbal cues for precautions/safety;Verbal cues for technique Transfer (Assistive device): Rolling walker Locomotion  Gait Ambulation: Yes Gait Assistance: Supervision/Verbal cueing Gait Distance (Feet): 200 Feet Assistive device: Rolling walker;Other (Comment) (R LE [place] Walk-on PLS AFO and toe cap) Gait Assistance Details: Verbal cues for safe use of DME/AE;Verbal cues for gait pattern Gait Gait: Yes Gait Pattern: Impaired Gait Pattern: Poor foot clearance - right;Decreased hip/knee flexion - right;Decreased dorsiflexion - right Gait velocity:  decreased Stairs / Additional Locomotion Stairs: Yes Stairs Assistance: Supervision/Verbal cueing Stair Management Technique: Two rails;Step to pattern (L UE support on each HR, leading with L LE on ascend and descent) Number of Stairs: 12 Height of Stairs: 6 Ramp: Supervision/Verbal cueing (using RW) Curb: Contact Guard/Touching assist (using RW) Wheelchair Mobility Wheelchair Mobility: No  Trunk/Postural Assessment  Cervical Assessment Cervical Assessment: Within Functional Limits Thoracic Assessment Thoracic Assessment: Within Functional Limits Lumbar Assessment Lumbar Assessment: Within Functional Limits Postural Control Postural Control: Within Functional Limits (using RW)  Balance Balance Balance Assessed: Yes Static Sitting Balance Static Sitting - Balance Support: Feet supported Static Sitting - Level of Assistance: 6: Modified independent (Device/Increase time) Dynamic Sitting Balance Dynamic Sitting - Balance Support: Feet supported Dynamic Sitting - Level of Assistance: 6: Modified independent (Device/Increase time) Static Standing Balance Static Standing - Balance Support: During functional activity;Bilateral upper extremity supported Static Standing - Level of Assistance: 6: Modified independent (Device/Increase time) Dynamic Standing Balance Dynamic Standing - Balance Support: During functional activity;Bilateral upper extremity supported Dynamic Standing - Level of Assistance: 5: Stand by assistance Extremity Assessment      RLE Assessment Active Range of Motion (AROM) Comments: tightness noted in heel cord but improving General Strength Comments: assessed in sitting RLE Strength Right Hip Flexion: 3/5 Right Knee Flexion: 3+/5 Right Knee Extension: 4/5 Right Ankle Dorsiflexion: 2+/5 Right Ankle Plantar Flexion: 3-/5 LLE Assessment LLE Assessment: Within Functional Limits Active Range of Motion (AROM) Comments: WFL/WNL General Strength Comments:  assessed in sitting LLE Strength Left Hip Flexion: 5/5 Left Knee Flexion: 5/5 Left Knee Extension: 5/5 Left Ankle Dorsiflexion: 5/5 Left Ankle Plantar Flexion: 5/5   Deneise Getty M Ludean Duhart , PT, DPT, NCS, CSRS 06/03/2023, 10:00 AM

## 2023-06-03 NOTE — Progress Notes (Signed)
Made arrangements for Coumadin clinic with cardiology for Monday in [place] at 2 pm.

## 2023-06-03 NOTE — Progress Notes (Signed)
Occupational Therapy Discharge Summary  Patient Details  Name: [name] MRN: [id number] Date of Birth: 17-Apr-1956  Date of Discharge from OT service:May 04, 2023  Today's Date: 06/03/2023 OT Individual Time: 7829-5621 OT Individual Time Calculation (min): 46 min  Session 2: OT Individual Time: 3086-5784 OT Individual Time Calculation (min): 46 min   Patient has met 9 of 9 long term goals due to improved activity tolerance, improved balance, postural control, ability to compensate for deficits, functional use of  RIGHT upper and RIGHT lower extremity, improved attention, improved awareness, and improved coordination.  Patient to discharge at overall Supervision level.  Patient's care partner is independent to provide the necessary physical and cognitive assistance at discharge.    Reasons goals not met: all goals met   Recommendation:  Patient will benefit from ongoing skilled OT services in outpatient setting to continue to advance functional skills in the area of BADL, iADL, and Reduce care partner burden.  Equipment: TTB  Reasons for discharge: treatment goals met and discharge from hospital  Patient/family agrees with progress made and goals achieved: Yes  OT Discharge Skilled Therapeutic Interventions/Progress Updates:  AM Session: Pt received sitting presenting to be in good spirits receptive to skilled OT session reporting 0/10 pain- OT offering intermittent rest breaks, repositioning, and therapeutic support to optimize participation in therapy session. Pt requesting to take shower this AM. Focus this session BADL retraining with emphasis on safety awareness and energy conservation. Pt transitioned to EOB and completed functional mobility to bathroom using RW with supervision. Doffed clothing while seated on TTB using grab bars for balance when bringing pants from waist. Pt able to complete UB bathing seated on TTB while maintaining sternal precautions supervision and LB  bathing supervision using long handled sponge and standing briefly to wash bottom. Pt able to implement rest breaks during shower without verbal cues required. Pt completed functional mobility from bathroom to EOB using RW with supervision. Pt able to maintain sternal precautions while donning shirt supervision. Donned underwear, pants, and socks by crossing legs into figure-four position and standing to bring pants to waist supervision. Pt chose to complete grooming/hygiene tasks seated at sink for energy conservation with oral hygiene, shaving, and hair grooming completed with distant supervision. Pt then worked on [employer] coordination tasks of weaving belt through pants, buckling belt, and zipping/buttoning pants with Pt able to complete with maximal effort and increased time supervision. Pt was left resting in wc with call bell in reach, chair alarm on, and all needs met.    PM Session:  Pt received sitting up in bed dressed and ready for the day with all BADL needs met. Pt wearing R AFO and tennis shoes upon OT arrival. Pt presenting to be in good spirits receptive to skilled OT session reporting 0/10 pain- OT offering intermittent rest breaks, repositioning, and therapeutic support to optimize participation in therapy session.   Pt transitioned to EOB supervision using log rolling technique. Pt completed functional mobility from room to therapy gym using RW >200 ft for endurance training supervision.   Engaged Pt in completing series of FM coordination activities to increase RUE functional use for BADLs. Pt initially screwed/unscrewed small, medium, and large sized bolts to facilitate digit opposition, pincer grasp, and FM control. Pt requiring increased amount of time and maximal effort, however able to complete without compensatory movements or grasp patterns with min verbal cues. Pt then completed medium sized peg board activity placing and removing pegs using alternate grasp to facilitate digit  opposition of 5th digit to thumb and 4th digit to thumb. Pt then utilized only ulnar side of hand to place and remove pegs with Pt able to complete with maximal effort and increased time.   Pt fatigued at end of session. Transported back to room total A in wc. Stand step wc>EOB without AD CGA. EOB>supine supervision. Pt was left resting in bed with call bell in reach, bed  alarm on, and all needs met.   Precautions/Restrictions  Precautions Precautions: Fall;Sternal Precaution Comments: sternal precautions Restrictions Weight Bearing Restrictions: No RUE Weight Bearing: Non weight bearing LUE Weight Bearing: Non weight bearing Other Position/Activity Restrictions: sternal precautions   ADL ADL Equipment Provided: Long-handled sponge Eating: Set up Where Assessed-Eating: Chair Grooming: Setup Where Assessed-Grooming: Sitting at sink Upper Body Bathing: Supervision/safety Where Assessed-Upper Body Bathing: Shower Lower Body Bathing: Supervision/safety Where Assessed-Lower Body Bathing: Shower Upper Body Dressing: Supervision/safety Where Assessed-Upper Body Dressing: Edge of bed Lower Body Dressing: Supervision/safety Where Assessed-Lower Body Dressing: Edge of bed Toileting: Supervision/safety Where Assessed-Toileting: Teacher, adult education: Close supervision Toilet Transfer Method: Ambulating, Stand pivot Acupuncturist: Engineer, technical sales: Close supervison Web designer Method: Ambulating, Stand pivot Tub/Shower Equipment: Insurance underwriter: Close supervision Film/video editor Method: Ambulating, Stand pivot [employer]: Grab bars, Sales promotion account executive Baseline Vision/History: 1 Wears glasses Patient Visual Report: No change from baseline Vision Assessment?: No apparent visual deficits Perception  Perception: Within Functional Limits Praxis Praxis: Intact Cognition Cognition Overall  Cognitive Status: Within Functional Limits for tasks assessed Arousal/Alertness: Awake/alert Orientation Level: Person;Place;Situation Person: Oriented Place: Oriented Situation: Oriented Memory: Impaired Memory Impairment: Decreased recall of new information;Decreased short term memory Decreased Short Term Memory: Functional basic;Verbal basic Attention: Selective Focused Attention: Appears intact Sustained Attention: Appears intact Selective Attention: Appears intact Awareness: Appears intact Problem Solving: Appears intact Safety/Judgment: Appears intact Brief Interview for Mental Status (BIMS) Repetition of Three Words (First Attempt): 3 Temporal Orientation: Year: Correct Temporal Orientation: Month: Accurate within 5 days Temporal Orientation: Day: Correct Recall: "Sock": Yes, no cue required Recall: "Blue": Yes, no cue required Recall: "Bed": Yes, no cue required BIMS Summary Score: 15 Sensation Sensation Light Touch: Impaired Detail Light Touch Impaired Details: Impaired RLE;Impaired LLE Hot/Cold: Appears Intact Proprioception: Impaired by gross assessment (in R LE) Stereognosis: Appears Intact Coordination Gross Motor Movements are Fluid and Coordinated: No Fine Motor Movements are Fluid and Coordinated: No Coordination and Movement Description: impaired GM movements in R LE due to paresis, unbalanced muscle activation, and change in tone but improved since eval Finger Nose Finger Test: Slowed motor movements on RUE, however improvement from inital evaluation Motor  Motor Motor: Hemiplegia;Abnormal tone Motor - Discharge Observations: continues with mild R hemiparesis on top of baseline R LE impairments but improved since initial eval Mobility  Bed Mobility Bed Mobility: Sit to Supine;Supine to Sit Supine to Sit: Independent with assistive device Sit to Supine: Independent with assistive device Transfers Sit to Stand: Supervision/Verbal cueing Stand to Sit:  Supervision/Verbal cueing  Trunk/Postural Assessment  Cervical Assessment Cervical Assessment: Within Functional Limits Thoracic Assessment Thoracic Assessment: Within Functional Limits Lumbar Assessment Lumbar Assessment: Within Functional Limits Postural Control Postural Control: Within Functional Limits (using RW)  Balance Balance Balance Assessed: Yes Static Sitting Balance Static Sitting - Balance Support: Feet supported Static Sitting - Level of Assistance: 6: Modified independent (Device/Increase time) Dynamic Sitting Balance Dynamic Sitting - Balance Support: Feet supported Dynamic Sitting - Level of Assistance: 6: Modified independent (Device/Increase time) Static  Standing Balance Static Standing - Balance Support: During functional activity;Bilateral upper extremity supported Static Standing - Level of Assistance: 6: Modified independent (Device/Increase time) Dynamic Standing Balance Dynamic Standing - Balance Support: During functional activity;Bilateral upper extremity supported Dynamic Standing - Level of Assistance: 5: Stand by assistance Extremity/Trunk Assessment RUE Assessment RUE Assessment: Exceptions to [facility] General Strength Comments: 4/5 proximally and distally; grip 3+/5 LUE Assessment LUE Assessment: Within Functional Limits   [employer]ossa 06/03/2023, 3:55 PM

## 2023-06-03 NOTE — Progress Notes (Signed)
PROGRESS NOTE   Subjective/Complaints:  INRs excellent in 2.0-2.5 range over the last week Woke up yesterday during nap "breathing hard"  mild DOE  ROS: Patient denies CP, SOB, N/V/D  Objective:   No results found. Recent Labs    06/01/23 0701  WBC 9.5  HGB 9.7*  HCT 30.7*  PLT 355   Recent Labs    06/01/23 0701  NA 135  K 4.0  CL 104  CO2 21*  GLUCOSE 106*  BUN 24*  CREATININE 1.04  CALCIUM 8.7*     Intake/Output Summary (Last 24 hours) at 06/03/2023 1044 Last data filed at 06/03/2023 0730 Gross per 24 hour  Intake 760 ml  Output --  Net 760 ml        Physical Exam: Vital Signs Blood pressure 126/87, pulse 78, temperature 98.3 F (36.8 C), resp. rate 17, height 5\' 8"  (1.727 m), weight 75.3 kg, SpO2 100%.    General: No acute distress Mood and affect are appropriate Heart: Regular rate and rhythm no rubs murmurs or extra sounds Lungs: Clear to auscultation, breathing unlabored, no rales or wheezes Abdomen: Positive bowel sounds, soft nontender to palpation, nondistended Extremities: No clubbing, cyanosis, or edema Skin: No evidence of breakdown, no evidence of rash    Skin: No evidence of breakdown, no evidence of rash Neurologic: Cranial nerves II through XII intact, motor strength is 5/5 in left deltoid, bicep, tricep, grip, hip flexor, knee extensors, ankle dorsiflexor and plantar flexor  4-/5 strength in the right deltoid, bicep, tricep, grip improving, 4/5 strength in the right hip flexor knee extensor ankle dorsiflexor and plantar flexor.  Sensory exam normal sensation to light touch  in bilateral upper and lower extremities Musculoskeletal: Full range of motion in all 4 extremities. No joint swelling   Assessment/Plan: 1. Functional deficits which require 3+ hours per day of interdisciplinary therapy in a comprehensive inpatient rehab setting. Physiatrist is providing close team  supervision and 24 hour management of active medical problems listed below. Physiatrist and rehab team continue to assess barriers to discharge/monitor patient progress toward functional and medical goals  Care Tool:  Bathing    Body parts bathed by patient: Right arm, Left arm, Chest, Abdomen, Front perineal area, Right upper leg, Left upper leg, Face   Body parts bathed by helper: Buttocks, Right lower leg, Left lower leg     Bathing assist Assist Level: Minimal Assistance - Patient > 75%     Upper Body Dressing/Undressing Upper body dressing   What is the patient wearing?: Pull over shirt    Upper body assist Assist Level: Minimal Assistance - Patient > 75%    Lower Body Dressing/Undressing Lower body dressing      What is the patient wearing?: Pants     Lower body assist Assist for lower body dressing: Moderate Assistance - Patient 50 - 74%     Toileting Toileting    Toileting assist Assist for toileting: Moderate Assistance - Patient 50 - 74% Assistive Device Comment: RW   Transfers Chair/bed transfer  Transfers assist     Chair/bed transfer assist level: Minimal Assistance - Patient > 75%     Locomotion Ambulation  Ambulation assist      Assist level: Minimal Assistance - Patient > 75% Assistive device: Orthosis Max distance: 228ft   Walk 10 feet activity   Assist     Assist level: Minimal Assistance - Patient > 75% Assistive device: No Device, Orthosis   Walk 50 feet activity   Assist    Assist level: Minimal Assistance - Patient > 75% Assistive device: No Device, Orthosis    Walk 150 feet activity   Assist    Assist level: Minimal Assistance - Patient > 75% Assistive device: No Device, Orthosis    Walk 10 feet on uneven surface  activity   Assist     Assist level: Minimal Assistance - Patient > 75% Assistive device: Hand held assist   Wheelchair     Assist Is the patient using a wheelchair?: No              Wheelchair 50 feet with 2 turns activity    Assist            Wheelchair 150 feet activity     Assist          Blood pressure 126/87, pulse 78, temperature 98.3 F (36.8 C), resp. rate 17, height 5\' 8"  (1.727 m), weight 75.3 kg, SpO2 100%.  Medical Problem List and Plan: 1. 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  Primarily has right upper extremity weakness and decreased balance             -patient may shower             -ELOS/Goals: 7/18 mod I goals with PT and OT   -Continue [name] therapies including PT, OT  2.  Antithrombotics: -DVT/anticoagulation:  Pharmaceutical: Coumadin- can go to weekly as outpt              -antiplatelet therapy: Aspirin 81 mg indefinitely   3. Pain Management. Has long hx of low back pain followed by ortho. Having intermittent radicular-type symptoms in the RLE. Also has had cervical symptoms as well We discussed no elective surgeries recommended for 6 mo post CVA -Tylenol, tramodol, oxycodone as needed             -baclofen 10 mg daily             -consider trial of gabapentin             -encouraging stretching, oob, physical activity to tolerance   -7/14 appears to be comfortable 4. Mood/Behavior/Sleep: LCSW to evaluate and provide emotional support             -antipsychotic agents: n/a   - sleep improved with melatonin 5mg  qhs 5. Neuropsych/cognition: This patient is capable of making decisions on his own behalf.   6. Skin/Wound Care: Routine skin care checks             -remove chest tube sutures on 7/12             -routine sternotomy incision care   7. Fluids/Electrolytes/Nutrition: Routine Is and Os and follow-up chemistries   8: Hypertension: monitor TID and prn             -continue Lopressor 12.5 mg Bid--generally controlled, HR normal  Vitals:   06/03/23 0358 06/03/23 1039  BP: (!) 130/90 126/87  Pulse: 76 78  Resp: 17   Temp: 98.3 F  (36.8 C)   SpO2: 100%       9: Hyperlipidemia: continue  statin   11: severe aortic stenosis s/p AVR and aortic root replacement 7/01             -sternal precautions, coumadin             -follow-up with Dr. Laneta Simmers -TTE in 6 weeks, SBE prophylaxis    12: postop atrial fibrillation:rate controlled; on Coumadin (pharmacy consult)             -follow INR -continue amiodarone 200mg  daily   13: ABLA: stable; follow-up CBC             -continue oral iron supplementation Recheck mildly improved     Latest Ref Rng & Units 06/01/2023    7:01 AM 05/29/2023    6:05 AM 05/26/2023    5:44 AM  CBC  WBC 4.0 - 10.5 K/uL 9.5  9.2  8.8   Hemoglobin 13.0 - 17.0 g/dL 9.7  9.2  8.5   Hematocrit 39.0 - 52.0 % 30.7  29.2  26.8   Platelets 150 - 400 K/uL 355  278  247       14: azotemia: fluid intake improved ~1092ml /24h       Latest Ref Rng & Units 06/01/2023    7:01 AM 05/26/2023    5:44 AM 05/23/2023    8:15 AM  BMP  Glucose 70 - 99 mg/dL 454  098  119   BUN 8 - 23 mg/dL 24  29  26    Creatinine 0.61 - 1.24 mg/dL 1.47  8.29  5.62   Sodium 135 - 145 mmol/L 135  137  140   Potassium 3.5 - 5.1 mmol/L 4.0  4.3  3.5   Chloride 98 - 111 mmol/L 104  105  99   CO2 22 - 32 mmol/L 21  22  25    Calcium 8.9 - 10.3 mg/dL 8.7  8.5  8.7   Stable   15: GERD: continue PPI   16: ? BPH: continue Proscar 5 mg daily             -monitor voiding patterns  17.  Episode of nausea  arm tingling yesterday am no recurrence , did not take melatonin last noc , no further w/u needed , enc po fluids and monitor for recurrence  LOS: 9 days A FACE TO FACE EVALUATION WAS PERFORMED  Erick Colace 06/03/2023, 10:44 AM

## 2023-06-03 NOTE — Progress Notes (Signed)
Patient ID: [name], male   DOB: [birth year], 67 y.o.   MRN: [id number]  Team Conference Report to Patient/Family  Team Conference discussion was reviewed with the patient and caregiver, including goals, any changes in plan of care and target discharge date.  Patient and caregiver express understanding and are in agreement.  The patient has a target discharge date of 06/04/23.  SW met with patient to discuss team conference updates. Patient medically stable for d/c tomorrow. Patient OP referral submitted to [facility]. No additional questions or concerns. Patient son will arrive tomorrow at 9 AM.   Andria Rhein 06/03/2023, 1:02 PM

## 2023-06-04 ENCOUNTER — Other Ambulatory Visit (HOSPITAL_COMMUNITY): Payer: Self-pay

## 2023-06-04 DIAGNOSIS — I63032 Cerebral infarction due to thrombosis of left carotid artery: Secondary | ICD-10-CM

## 2023-06-04 LAB — PROTIME-INR
INR: 2 — ABNORMAL HIGH (ref 0.8–1.2)
Prothrombin Time: 23.2 seconds — ABNORMAL HIGH (ref 11.4–15.2)

## 2023-06-04 MED ORDER — TRIGELS-F FORTE 460-60-0.01-1 MG PO CAPS
1.0000 | ORAL_CAPSULE | Freq: Every day | ORAL | 0 refills | Status: DC
  Filled 2023-06-04: qty 30, 30d supply, fill #0

## 2023-06-04 MED ORDER — WARFARIN SODIUM 5 MG PO TABS
2.5000 mg | ORAL_TABLET | Freq: Every day | ORAL | 0 refills | Status: DC
  Filled 2023-06-04: qty 15, 30d supply, fill #0

## 2023-06-04 MED ORDER — BACLOFEN 10 MG PO TABS: 10.0000 mg | ORAL_TABLET | Freq: Every day | ORAL | Status: DC

## 2023-06-04 MED ORDER — BISACODYL 5 MG PO TBEC: 5.0000 mg | DELAYED_RELEASE_TABLET | Freq: Every day | ORAL | Status: DC

## 2023-06-04 MED ORDER — ACETAMINOPHEN 325 MG PO TABS: 325.0000 mg | ORAL_TABLET | ORAL | Status: DC | PRN

## 2023-06-04 MED ORDER — WARFARIN SODIUM 2.5 MG PO TABS: 2.5000 mg | ORAL_TABLET | Freq: Every day | ORAL | Status: DC

## 2023-06-04 MED ORDER — FINASTERIDE 5 MG PO TABS
5.0000 mg | ORAL_TABLET | Freq: Every day | ORAL | 0 refills | Status: AC
Start: 2023-06-04 — End: ?
  Filled 2023-06-04: qty 30, 30d supply, fill #0

## 2023-06-04 MED ORDER — AMIODARONE HCL 200 MG PO TABS
200.0000 mg | ORAL_TABLET | Freq: Every day | ORAL | 0 refills | Status: DC
  Filled 2023-06-04: qty 30, 30d supply, fill #0

## 2023-06-04 MED ORDER — METOPROLOL TARTRATE 25 MG PO TABS
12.5000 mg | ORAL_TABLET | Freq: Two times a day (BID) | ORAL | 0 refills | Status: DC
  Filled 2023-06-04: qty 30, 30d supply, fill #0

## 2023-06-04 NOTE — Progress Notes (Signed)
Recreational Therapy Discharge Summary Patient Details  Name: [name] MRN: [id number] Date of Birth: 1956/01/19 Today's Date: 06/04/2023   Comments on progress toward goals: Pt has made great progress during LOS and is discharging home today with family at overall supervision ambulatory level using RW.  TR sessions focused on pt education, activity analysis identifying potential modifications & community reintegration.  Pt participated in an outing at overall contact guard assist ambulatory level.  Goals met.   Reasons for discharge: discharge from hospital  Follow-up: Outpatient  Patient/family agrees with progress made and goals achieved: Yes  Random Dobrowski 06/04/2023, 2:35 PM

## 2023-06-04 NOTE — Progress Notes (Signed)
Inpatient Rehabilitation Discharge Medication Review by a Pharmacist  A complete drug regimen review was completed for this patient to identify any potential clinically significant medication issues.  High Risk Drug Classes Is patient taking? Indication by Medication  Antipsychotic No   Anticoagulant Yes Warfarin - Afib/CVA  Antibiotic No   Opioid No   Antiplatelet No   Hypoglycemics/insulin No   Vasoactive Medication Yes Metoprolol, amiodarone - Afib  Chemotherapy No   Other Yes Atorvastatin - HLD Baclofen - muscle spasms Finasteride - BPH Trigels - supplement     Type of Medication Issue Identified Description of Issue Recommendation(s)  Drug Interaction(s) (clinically significant)     Duplicate Therapy     Allergy     No Medication Administration End Date     Incorrect Dose     Additional Drug Therapy Needed     Significant med changes from prior encounter (inform family/care partners about these prior to discharge).    Other       Clinically significant medication issues were identified that warrant physician communication and completion of prescribed/recommended actions by midnight of the next day:  No  Time spent performing this drug regimen review (minutes):  30 minutes  Okey Regal, PharmD

## 2023-06-04 NOTE — Progress Notes (Signed)
Inpatient Rehabilitation Care Coordinator Discharge Note   Patient Details  Name: [name] MRN: [id number] Date of Birth: 1956/11/05   Discharge location: Home  Length of Stay: 11 Days  Discharge activity level: Sup/Mod I  Home/community participation: son, spouse and family  Patient response WU:JWJXBJ Literacy - How often do you need to have someone help you when you read instructions, pamphlets, or other written material from your doctor or pharmacy?: Never  Patient response YN:WGNFAO Isolation - How often do you feel lonely or isolated from those around you?: Never  Services provided included: MD, RD, PT, OT, SLP, RN, CM, TR, Pharmacy, SW  Financial Services:  Financial Services Utilized: Medicare    Choices offered to/list presented to: patient  Follow-up services arranged:  Outpatient    [facility] [place] [phone]      Patient response to transportation need: Is the patient able to respond to transportation needs?: Yes In the past 12 months, has lack of transportation kept you from medical appointments or from getting medications?: No In the past 12 months, has lack of transportation kept you from meetings, work, or from getting things needed for daily living?: No   Patient/Family verbalized understanding of follow-up arrangements:  Yes  Individual responsible for coordination of the follow-up plan: patient  Confirmed correct DME delivered: Andria Rhein 06/04/2023    Comments (or additional information):  Summary of Stay    Date/Time Discharge Planning CSW  06/02/23 1431 Discharging home on Thursday. DME ordered through [employer]. OP referral submitted to [facility]. CJB  05/26/23 1127 Discharging home with spouse and son able to supervise 24/7. CJB       Andria Rhein

## 2023-06-04 NOTE — Plan of Care (Signed)
  Problem: Consults Goal: RH STROKE PATIENT EDUCATION Description: See Patient Education module for education specifics  Outcome: Completed/Met   Problem: RH SAFETY Goal: RH STG ADHERE TO SAFETY PRECAUTIONS W/ASSISTANCE/DEVICE Description: STG Adhere to Safety Precautions With cues Assistance/Device. Outcome: Completed/Met   Problem: RH KNOWLEDGE DEFICIT Goal: RH STG INCREASE KNOWLEDGE OF HYPERTENSION Description: Patient and wife will be able to manage HTN with medications and dietary modifications using educational resources independently Outcome: Completed/Met Goal: RH STG INCREASE KNOWLEGDE OF HYPERLIPIDEMIA Description: Patient and wife will be able to manage HLD with medications and dietary modifications using educational resources independently Outcome: Completed/Met Goal: RH STG INCREASE KNOWLEDGE OF STROKE PROPHYLAXIS Description: Patient and wife will be able to manage a-fib and secondary risks with medications and dietary modifications using educational resources independently Outcome: Completed/Met

## 2023-06-04 NOTE — Progress Notes (Signed)
PROGRESS NOTE   Subjective/Complaints:  No issues overnite , discussed no driving (pt cannot even imagine driving now)   ROS: Patient denies CP, SOB, N/V/D  Objective:   DG Chest 2 View  Result Date: 06/03/2023 CLINICAL DATA:  Orthopnea. EXAM: CHEST - 2 VIEW COMPARISON:  05/20/2023. FINDINGS: Trachea is midline. Heart size stable. Lungs are clear. Trace left pleural fluid. IMPRESSION: Trace left pleural effusion. Electronically Signed   By: Leanna Battles M.D.   On: 06/03/2023 16:24   No results for input(s): "WBC", "HGB", "HCT", "PLT" in the last 72 hours.  No results for input(s): "NA", "K", "CL", "CO2", "GLUCOSE", "BUN", "CREATININE", "CALCIUM" in the last 72 hours.    Intake/Output Summary (Last 24 hours) at 06/04/2023 0831 Last data filed at 06/03/2023 2055 Gross per 24 hour  Intake 398 ml  Output --  Net 398 ml        Physical Exam: Vital Signs Blood pressure (!) 131/95, pulse 82, temperature 97.6 F (36.4 C), resp. rate 17, height 5\' 8"  (1.727 m), weight 75.2 kg, SpO2 100%.    General: No acute distress Mood and affect are appropriate Heart: Regular rate and rhythm no rubs murmurs or extra sounds Lungs: Clear to auscultation, breathing unlabored, no rales or wheezes Abdomen: Positive bowel sounds, soft nontender to palpation, nondistended Extremities: No clubbing, cyanosis, or edema Skin: No evidence of breakdown, no evidence of rash    Skin: No evidence of breakdown, no evidence of rash Neurologic: Cranial nerves II through XII intact, motor strength is 5/5 in left deltoid, bicep, tricep, grip, hip flexor, knee extensors, ankle dorsiflexor and plantar flexor  4-/5 strength in the right deltoid, bicep, tricep, grip improving, 4/5 strength in the right hip flexor knee extensor ankle dorsiflexor and plantar flexor.  Sensory exam normal sensation to light touch  in bilateral upper and lower extremities  Musculoskeletal: Full range of motion in all 4 extremities. No joint swelling   Assessment/Plan: 1. Functional deficits left CVA with right hemiparesis  Stable for D/C today F/u PCP in 3-4 weeks F/u PM&R 3-4 weeks F/u CVTS 1-2 wk F/u Cardiology  F/u neurology 1-2 mo  See D/C summary See D/C instructions  Discussed no driving until released by MD Care Tool:  Bathing    Body parts bathed by patient: Right arm, Left arm, Chest, Abdomen, Front perineal area, Right upper leg, Left upper leg, Face, Buttocks, Left lower leg, Right lower leg   Body parts bathed by helper: Buttocks, Right lower leg, Left lower leg     Bathing assist Assist Level: Supervision/Verbal cueing     Upper Body Dressing/Undressing Upper body dressing   What is the patient wearing?: Pull over shirt    Upper body assist Assist Level: Supervision/Verbal cueing    Lower Body Dressing/Undressing Lower body dressing      What is the patient wearing?: Pants     Lower body assist Assist for lower body dressing: Supervision/Verbal cueing     Toileting Toileting    Toileting assist Assist for toileting: Supervision/Verbal cueing Assistive Device Comment: RW   Transfers Chair/bed transfer  Transfers assist     Chair/bed transfer assist level: Supervision/Verbal cueing Chair/bed  transfer assistive device: Arboriculturist assist      Assist level: Supervision/Verbal cueing Assistive device: Walker-rolling Max distance: 279ft   Walk 10 feet activity   Assist     Assist level: Supervision/Verbal cueing Assistive device: Walker-rolling, Orthosis   Walk 50 feet activity   Assist    Assist level: Supervision/Verbal cueing Assistive device: Walker-rolling, Orthosis    Walk 150 feet activity   Assist    Assist level: Supervision/Verbal cueing Assistive device: Walker-rolling, Orthosis    Walk 10 feet on uneven surface  activity   Assist      Assist level: Supervision/Verbal cueing Assistive device: Walker-rolling, Orthosis   Wheelchair     Assist Is the patient using a wheelchair?: No             Wheelchair 50 feet with 2 turns activity    Assist            Wheelchair 150 feet activity     Assist          Blood pressure (!) 131/95, pulse 82, temperature 97.6 F (36.4 C), resp. rate 17, height 5\' 8"  (1.727 m), weight 75.2 kg, SpO2 100%.  Medical Problem List and Plan: 1. 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  Primarily has right upper extremity weakness and decreased balance             ready for discharge              -ELOS/Goals: 7/18 mod I goals with PT and OT   -Continue [name] therapies including PT, OT  2.  Antithrombotics: -DVT/anticoagulation:  Pharmaceutical: Coumadin- can go to weekly as outpt              -antiplatelet therapy: Aspirin 81 mg indefinitely   3. Pain Management. Has long hx of low back pain followed by ortho. Having intermittent radicular-type symptoms in the RLE. Also has had cervical symptoms as well We discussed no elective surgeries recommended for 6 mo post CVA -Tylenol, tramodol, oxycodone as needed             -baclofen 10 mg daily             -consider trial of gabapentin             -encouraging stretching, oob, physical activity to tolerance   -7/14 appears to be comfortable 4. Mood/Behavior/Sleep: LCSW to evaluate and provide emotional support             -antipsychotic agents: n/a   - sleep improved with melatonin 5mg  qhs 5. Neuropsych/cognition: This patient is capable of making decisions on his own behalf.   6. Skin/Wound Care: Routine skin care checks             -remove chest tube sutures on 7/12             -routine sternotomy incision care   7. Fluids/Electrolytes/Nutrition: Routine Is and Os and follow-up chemistries   8: Hypertension: monitor TID and prn              -continue Lopressor 12.5 mg Bid--generally controlled, HR normal  Vitals:   06/04/23 0426 06/04/23 0821  BP: 109/72 (!) 131/95  Pulse: 69 82  Resp: 17   Temp: 97.6 F (36.4 C)   SpO2: 100%       9: Hyperlipidemia: continue statin   11: severe aortic stenosis s/p AVR and aortic root  replacement 7/01             -sternal precautions, coumadin             -follow-up with Dr. Laneta Simmers -TTE in 6 weeks, SBE prophylaxis    12: postop atrial fibrillation:rate controlled; on Coumadin (pharmacy consult)             -follow INR -continue amiodarone 200mg  daily   13: ABLA: stable; follow-up CBC             -continue oral iron supplementation Recheck mildly improved     Latest Ref Rng & Units 06/01/2023    7:01 AM 05/29/2023    6:05 AM 05/26/2023    5:44 AM  CBC  WBC 4.0 - 10.5 K/uL 9.5  9.2  8.8   Hemoglobin 13.0 - 17.0 g/dL 9.7  9.2  8.5   Hematocrit 39.0 - 52.0 % 30.7  29.2  26.8   Platelets 150 - 400 K/uL 355  278  247       14: azotemia: fluid intake improved ~1079ml /24h       Latest Ref Rng & Units 06/01/2023    7:01 AM 05/26/2023    5:44 AM 05/23/2023    8:15 AM  BMP  Glucose 70 - 99 mg/dL 191  478  295   BUN 8 - 23 mg/dL 24  29  26    Creatinine 0.61 - 1.24 mg/dL 6.21  3.08  6.57   Sodium 135 - 145 mmol/L 135  137  140   Potassium 3.5 - 5.1 mmol/L 4.0  4.3  3.5   Chloride 98 - 111 mmol/L 104  105  99   CO2 22 - 32 mmol/L 21  22  25    Calcium 8.9 - 10.3 mg/dL 8.7  8.5  8.7   Stable   15: GERD: continue PPI   16: ? BPH: continue Proscar 5 mg daily             -monitor voiding patterns  17.  Episode of nausea  arm tingling yesterday am no recurrence , did not take melatonin last noc , no further w/u needed , enc po fluids and monitor for recurrence  LOS: 10 days A FACE TO FACE EVALUATION WAS PERFORMED  Erick Colace 06/04/2023, 8:31 AM

## 2023-06-05 ENCOUNTER — Ambulatory Visit: Payer: Medicare Other | Admitting: Pulmonary Disease

## 2023-06-06 DIAGNOSIS — Z48812 Encounter for surgical aftercare following surgery on the circulatory system: Secondary | ICD-10-CM | POA: Diagnosis not present

## 2023-06-08 ENCOUNTER — Ambulatory Visit: Payer: Medicare Other | Attending: Cardiology | Admitting: *Deleted

## 2023-06-08 DIAGNOSIS — Z952 Presence of prosthetic heart valve: Secondary | ICD-10-CM | POA: Diagnosis not present

## 2023-06-08 DIAGNOSIS — Z5181 Encounter for therapeutic drug level monitoring: Secondary | ICD-10-CM | POA: Diagnosis not present

## 2023-06-08 DIAGNOSIS — I639 Cerebral infarction, unspecified: Secondary | ICD-10-CM | POA: Insufficient documentation

## 2023-06-08 DIAGNOSIS — I4891 Unspecified atrial fibrillation: Secondary | ICD-10-CM | POA: Insufficient documentation

## 2023-06-08 DIAGNOSIS — I480 Paroxysmal atrial fibrillation: Secondary | ICD-10-CM | POA: Diagnosis not present

## 2023-06-08 LAB — POCT INR: INR: 2.8 (ref 2.0–3.0)

## 2023-06-08 NOTE — Patient Instructions (Signed)
Continue warfarin 1/2 tablet (2.5mg ) daily Recheck in 1 week

## 2023-06-09 ENCOUNTER — Ambulatory Visit: Payer: Medicare Other | Admitting: Nurse Practitioner

## 2023-06-11 ENCOUNTER — Telehealth: Payer: Self-pay | Admitting: Cardiology

## 2023-06-11 NOTE — Telephone Encounter (Signed)
   Tammy from [facility] called and informed us that the patient's weight is 162.3, which is below the parameter of 165. She is reporting this to Dr. Diona Browner. Tammy requested that we call the patient with any recommendations. If there are new orders from Dr. Diona Browner, she asked that we contact their office at [phone].

## 2023-06-12 NOTE — Telephone Encounter (Signed)
Unable to reach home health - call placed to patient.  States he is not too concerned about his weight.  Stated that he had been weighing 170's for years then lost about 10lbs when he was in the hospital.  Food is still not tasting that good to him yet.  Advised him to continue to keep weight log & he can bring to his visit on 06/19/2023 for provider review.  Also, reminded to bring current medication list.  He verbalized understanding.

## 2023-06-15 ENCOUNTER — Ambulatory Visit: Payer: Medicare Other | Attending: Cardiology | Admitting: *Deleted

## 2023-06-15 DIAGNOSIS — Z5181 Encounter for therapeutic drug level monitoring: Secondary | ICD-10-CM | POA: Insufficient documentation

## 2023-06-15 DIAGNOSIS — Z952 Presence of prosthetic heart valve: Secondary | ICD-10-CM | POA: Insufficient documentation

## 2023-06-15 DIAGNOSIS — I639 Cerebral infarction, unspecified: Secondary | ICD-10-CM | POA: Diagnosis present

## 2023-06-15 DIAGNOSIS — I480 Paroxysmal atrial fibrillation: Secondary | ICD-10-CM | POA: Diagnosis present

## 2023-06-15 LAB — POCT INR: INR: 2.7 (ref 2.0–3.0)

## 2023-06-15 MED ORDER — WARFARIN SODIUM 2.5 MG PO TABS: 2.5000 mg | ORAL_TABLET | Freq: Every day | ORAL | 5 refills | Status: DC

## 2023-06-15 NOTE — Patient Instructions (Signed)
Continue warfarin 1/2 tablet (2.5mg ) daily Recheck in 2 weeks Will change to 2.5mg  tablet (1 daily) when he runs out of 5mg  tablets.  Rx sent in

## 2023-06-16 ENCOUNTER — Telehealth: Payer: Self-pay | Admitting: Nurse Practitioner

## 2023-06-16 NOTE — Telephone Encounter (Signed)
My Chart message sent

## 2023-06-16 NOTE — Telephone Encounter (Signed)
New Message:   She called to let you know that the patient weight is 163 and his perimeter for his weight  is 165 to 175.

## 2023-06-19 ENCOUNTER — Ambulatory Visit: Payer: Medicare Other | Attending: Nurse Practitioner | Admitting: Nurse Practitioner

## 2023-06-19 ENCOUNTER — Encounter: Payer: Self-pay | Admitting: Nurse Practitioner

## 2023-06-19 VITALS — BP 118/68 | HR 69 | Ht 68.0 in | Wt 162.6 lb

## 2023-06-19 DIAGNOSIS — Z79899 Other long term (current) drug therapy: Secondary | ICD-10-CM

## 2023-06-19 DIAGNOSIS — D500 Iron deficiency anemia secondary to blood loss (chronic): Secondary | ICD-10-CM

## 2023-06-19 DIAGNOSIS — I471 Supraventricular tachycardia, unspecified: Secondary | ICD-10-CM | POA: Diagnosis present

## 2023-06-19 DIAGNOSIS — Z8673 Personal history of transient ischemic attack (TIA), and cerebral infarction without residual deficits: Secondary | ICD-10-CM

## 2023-06-19 DIAGNOSIS — I9789 Other postprocedural complications and disorders of the circulatory system, not elsewhere classified: Secondary | ICD-10-CM | POA: Diagnosis present

## 2023-06-19 DIAGNOSIS — Z5181 Encounter for therapeutic drug level monitoring: Secondary | ICD-10-CM | POA: Diagnosis present

## 2023-06-19 DIAGNOSIS — Z9889 Other specified postprocedural states: Secondary | ICD-10-CM | POA: Diagnosis present

## 2023-06-19 DIAGNOSIS — Z905 Acquired absence of kidney: Secondary | ICD-10-CM | POA: Diagnosis present

## 2023-06-19 DIAGNOSIS — R531 Weakness: Secondary | ICD-10-CM | POA: Diagnosis present

## 2023-06-19 DIAGNOSIS — Z952 Presence of prosthetic heart valve: Secondary | ICD-10-CM

## 2023-06-19 DIAGNOSIS — I10 Essential (primary) hypertension: Secondary | ICD-10-CM

## 2023-06-19 DIAGNOSIS — Z8679 Personal history of other diseases of the circulatory system: Secondary | ICD-10-CM | POA: Diagnosis present

## 2023-06-19 DIAGNOSIS — E785 Hyperlipidemia, unspecified: Secondary | ICD-10-CM | POA: Diagnosis present

## 2023-06-19 DIAGNOSIS — Z85528 Personal history of other malignant neoplasm of kidney: Secondary | ICD-10-CM

## 2023-06-19 DIAGNOSIS — I2510 Atherosclerotic heart disease of native coronary artery without angina pectoris: Secondary | ICD-10-CM | POA: Diagnosis present

## 2023-06-19 DIAGNOSIS — I4891 Unspecified atrial fibrillation: Secondary | ICD-10-CM

## 2023-06-19 MED ORDER — ATORVASTATIN CALCIUM 80 MG PO TABS: 80.0000 mg | ORAL_TABLET | Freq: Every day | ORAL | 3 refills | Status: DC

## 2023-06-19 MED ORDER — AMIODARONE HCL 200 MG PO TABS: 200.0000 mg | ORAL_TABLET | Freq: Every day | ORAL | 3 refills | Status: DC

## 2023-06-19 MED ORDER — METOPROLOL TARTRATE 25 MG PO TABS: 12.5000 mg | ORAL_TABLET | Freq: Two times a day (BID) | ORAL | 3 refills | Status: DC

## 2023-06-19 MED ORDER — AMOXICILLIN 500 MG PO CAPS: ORAL_CAPSULE | ORAL | 0 refills | Status: DC

## 2023-06-19 NOTE — Progress Notes (Addendum)
Cardiology Office Note:    Date:  06/19/2023 ID:  [name], [name] [birth year], MRN [id number] PCP:  [facility] Providers Cardiologist:  Nona Dell, MD    Referring MD: [facility]  CC: Here for follow-up  History of Present Illness:    [name] is a very delightful 67 y.o. male with a PMH of severe AS due to bicuspid aortic valve, s/p AVR and ascending aortic aneurysm repair on May 18, 2023, CAD, s/p NSTEMI in 2015, HTN, PSVT, HLD, hx of RCC, s/p partial right nephrectomy in 2013.   Had an NSTEMI in 2015, underwent cardiac cath that showed occlusion of RCA filled by collaterals and had unsuccessful PCI, medical management recommended.  History of renal cell carcinoma, s/p partial nephrectomy in 2013.   Intraoperative TEE with EF approximately 50%.  Later in the evening on July 3 was noted to have right hand weakness and rapid response team was called.  Noted to have A-fib with RVR was hypotensive.  Was started on amiodarone infusion, cardiology was consulted.  Transition to amiodarone 200 mg twice daily.  CT of the head without evidence of acute infarct or hemorrhage, however MRI of the brain showed scattered small acute infarcts in left cerebellar hemisphere in both cerebral hemispheres that were likely embolic in etiology.  Neurology was consulted and was felt to be candidate for Coumadin due to new aortic valve as well as postoperative A-fib.  No DVT was noted.  Today he presents for post AVR follow-up.  He states that he is doing well since leaving the hospital. Participating in home health therapy. Does have a deficit, particularly right upper extremity weakness since the stroke, states right leg weakness has been occurring for 2 years since before his stroke.  Has been having some issues with constipation and appetite.  Weight has been stable recently. Denies any chest pain, shortness of breath, palpitations, syncope, presyncope,  dizziness, orthopnea, PND, swelling or significant weight changes, acute bleeding, or claudication.  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   ROS:   Please see the history of present illness.    All other systems reviewed and are negative.  EKGs/Labs/Other Studies Reviewed:    The following studies were reviewed today:  EKG:   EKG Interpretation Date/Time:  Friday June 19 2023 08:29:15 EDT Ventricular Rate:  64 PR Interval:  146 QRS Duration:  84 QT Interval:  412 QTC Calculation: 425 R Axis:   60  Text Interpretation: Normal sinus rhythm Possible Anterior infarct , age undetermined T wave abnormality, consider inferior ischemia When compared with ECG of 21-May-2023 04:26, Significant changes have occurred Confirmed by Sharlene Dory [phone]) on 06/19/2023 8:54:27 AM - T wave abnormality seen on previous EKG.  . CT angio chest 12/2022: IMPRESSION: 1. Ascending thoracic aortic aneurysm measuring 53 x 50 mm in diameter. Recommend semi-annual imaging followup by CTA or MRA and referral to cardiothoracic surgery if not already obtained. This recommendation follows 2010 ACCF/AHA/AATS/ACR/ASA/SCA/SCAI/SIR/STS/SVM Guidelines for the Diagnosis and Management of Patients With Thoracic Aortic Disease. Circulation. 2010; 121: J191-Y782. Aortic aneurysm NOS (ICD10-I71.9) 2. Thickening and calcification of the aortic valve. 3. Coronary artery and Aortic Atherosclerosis (ICD10-I70.0).  Echocardiogram on 11/14/2022:  1. Left ventricular ejection fraction, by estimation, is 55 to 60%. The  left ventricle has normal function. The left ventricle has no regional  wall motion abnormalities. There is mild left ventricular hypertrophy.  Left ventricular diastolic parameters  are consistent with Grade I diastolic dysfunction (impaired relaxation).  The average left ventricular global longitudinal strain is -19.0 %. The  global longitudinal strain is normal.   2. Right ventricular  systolic function is normal. The right ventricular  size is normal. Tricuspid regurgitation signal is inadequate for assessing  PA pressure.   3. The mitral valve is normal in structure. Trivial mitral valve  regurgitation. No evidence of mitral stenosis.   4. The tricuspid valve is abnormal.   5. The aortic valve has an indeterminant number of cusps. Aortic valve  regurgitation is moderate. Severe aortic valve stenosis.   6. Limited visualization of the ascending aorta, the visualized parts are  at least moderately dilated. Consider additional imaging with CTA or MRA  to better evalute degree and extent of aortic aneurysm. Marland Kitchen Aortic  dilatation noted. There is mild  dilatation of the aortic root, measuring 42 mm. There is moderate  dilatation of the ascending aorta, measuring 48 mm.   7. The inferior vena cava is normal in size with greater than 50%  respiratory variability, suggesting right atrial pressure of 3 mmHg.  Physical Exam:    VS:  BP 118/68   Pulse 69   Ht 5\' 8"  (1.727 m)   Wt 162 lb 9.6 oz (73.8 kg)   SpO2 99%   BMI 24.72 kg/m     Wt Readings from Last 3 Encounters:  06/19/23 162 lb 9.6 oz (73.8 kg)  06/04/23 165 lb 12.6 oz (75.2 kg)  05/25/23 165 lb 9.1 oz (75.1 kg)    GEN: Well nourished, well developed in no acute distress HEENT: Normal NECK: No JVD; No carotid bruits CARDIAC: S1/S2, RRR, no murmur, no rubs or gallops noted; 2+ peripheral pulses RESPIRATORY:  Clear to auscultation without rales, wheezing or rhonchi MUSCULOSKELETAL:  No edema; No deformity  SKIN: Warm and dry NEUROLOGIC:  Alert and oriented x 3 PSYCHIATRIC:  Calm and pleasant  ASSESSMENT & PLAN:    In order of problems listed above:  1. Severe aortic stenosis, bicuspid aortic valve, s/p SAVR Doing well post procedure. Working with home health and following sternal precautions well. No medication changes at this time. Discussed SBE prophylaxis, will prescribe Amoxicillin 2,000 mg once PRN  30 - 60 minutes prior to dental procedure. He verbalized understanding. Continue to follow-[facility].   2. CAD, status post NSTEMI in 2015 Denies any anginal symptoms, no indication for ischemic evaluation. Continue aspirin, Lipitor, and Lopressor. Heart healthy diet and regular physical activity as tolerated encouraged.  ED precautions discussed.   3. Thoracic aortic aneurysm, s/p repair CTA of chest revealed ascending thoracic aortic aneurysm measuring 53 x 50 mm. Underwent ascending aortic aneurysm repair on May 18, 2023 along with TAVR. Doing well. Denies any concerning symptoms. Follow-up with cardiothoracic surgery as scheduled. Care and ED precautions discussed. Heart healthy diet encouraged.   4. Hyperlipidemia Past LDL 75. Continue atorvastatin. Heart healthy diet encouraged.   5. Hypertension Blood pressure today stable. Continue Lopressor.  Heart healthy diet encouraged.   6. PSVT, post-op A-fib, amiodarone monitoring, medication management Denies any palpitations.  Continue Lopressor and Amiodarone. Heart healthy diet encouraged. Will obtain TSH, LFT.    7. Blood loss anemia Some acute blood loss anemia noted surrounding surgery - will recheck CBC in addition to labs as mentioned above.  8. History of RCC, status post partial right nephrectomy in 2013 Denies any issues.  He follows up with nephrology annually.  Continue current medication  regimen.  Continue to follow-up with nephrology.    9. Hx of CVA, Right side weakness MRI of the brain showed scattered small acute infarcts in left cerebellar hemisphere in both cerebral hemispheres that were likely embolic in etiology.  Neurology was consulted and was felt to be candidate for Coumadin due to new aortic valve as well as postoperative A-fib. Noted to have right upper extremity weakness after stroke, improving with [facility] therapy, right lower extremity weakness there prior to stroke. Continue HH therapy. Continue to  follow with PCP.  10. Disposition: Will refill medication per his request. Follow-up with me or APP in 3 months or sooner if anything changes.  Medication Adjustments/Labs and Tests Ordered: Current medicines are reviewed at length with the patient today.  Concerns regarding medicines are outlined above.  Orders Placed This Encounter  Procedures   CBC   TSH   Hepatic function panel   EKG 12-Lead   Meds ordered this encounter  Medications   atorvastatin (LIPITOR) 80 MG tablet    Sig: Take 1 tablet (80 mg total) by mouth daily.    Dispense:  90 tablet    Refill:  3    NA   metoprolol tartrate (LOPRESSOR) 25 MG tablet    Sig: Take 0.5 tablets (12.5 mg total) by mouth 2 (two) times daily.    Dispense:  90 tablet    Refill:  3   amiodarone (PACERONE) 200 MG tablet    Sig: Take 1 tablet (200 mg total) by mouth daily.    Dispense:  90 tablet    Refill:  3   amoxicillin (AMOXIL) 500 MG capsule    Sig: 4 500 Mg Tablets(2000 Mg) at once    Dispense:  4 capsule    Refill:  0    Patient Instructions  Medication Instructions:  Your physician has recommended you make the following change in your medication:  Take (4) 500 Mg Tablets (2000 Mg) of Amoxicillin 1 time for Aortic Valve replacement 30-60 Min before dental Procedure  Continue all other medications as prescribed   Labwork: CBC, TSH, LFT in 2-3 weeks at [facility]   Testing/Procedures: None  Follow-Up: Your physician recommends that you schedule a follow-up appointment in: 3 Months with Philis Nettle   Any Other Special Instructions Will Be Listed Below (If Applicable).  If you need a refill on your cardiac medications before your next appointment, please call your pharmacy.    Signed, Sharlene Dory, NP  06/21/2023 9:56 PM    [facility]

## 2023-06-19 NOTE — Patient Instructions (Addendum)
Medication Instructions:  Your physician has recommended you make the following change in your medication:  Take (4) 500 Mg Tablets (2000 Mg) of Amoxicillin 1 time for Aortic Valve replacement 30-60 Min before dental Procedure  Continue all other medications as prescribed   Labwork: CBC, TSH, LFT in 2-3 weeks at [facility]   Testing/Procedures: None  Follow-Up: Your physician recommends that you schedule a follow-up appointment in: 3 Months with Philis Nettle   Any Other Special Instructions Will Be Listed Below (If Applicable).  If you need a refill on your cardiac medications before your next appointment, please call your pharmacy.

## 2023-06-24 ENCOUNTER — Other Ambulatory Visit (HOSPITAL_COMMUNITY): Payer: Self-pay | Admitting: Nurse Practitioner

## 2023-06-24 DIAGNOSIS — I471 Supraventricular tachycardia, unspecified: Secondary | ICD-10-CM

## 2023-06-25 ENCOUNTER — Ambulatory Visit: Payer: Medicare Other | Admitting: Nurse Practitioner

## 2023-06-29 ENCOUNTER — Encounter (HOSPITAL_COMMUNITY): Payer: Medicare Other | Admitting: *Deleted

## 2023-06-29 DIAGNOSIS — Z952 Presence of prosthetic heart valve: Secondary | ICD-10-CM

## 2023-06-29 DIAGNOSIS — I480 Paroxysmal atrial fibrillation: Secondary | ICD-10-CM

## 2023-06-29 DIAGNOSIS — I639 Cerebral infarction, unspecified: Secondary | ICD-10-CM | POA: Insufficient documentation

## 2023-06-29 DIAGNOSIS — Z5181 Encounter for therapeutic drug level monitoring: Secondary | ICD-10-CM | POA: Diagnosis not present

## 2023-06-29 LAB — POCT INR: INR: 2.1 (ref 2.0–3.0)

## 2023-06-29 NOTE — Patient Instructions (Signed)
Will change to 2.5mg  tablet (1 daily) when he runs out of 5mg  tablets.  Rx sent in Continue warfarin 1/2 tablet (2.5mg ) daily Recheck in 3 weeks

## 2023-06-30 ENCOUNTER — Other Ambulatory Visit: Payer: Self-pay | Admitting: Surgery

## 2023-06-30 DIAGNOSIS — I7121 Aneurysm of the ascending aorta, without rupture: Secondary | ICD-10-CM

## 2023-07-01 ENCOUNTER — Encounter: Payer: Self-pay | Admitting: Surgery

## 2023-07-01 ENCOUNTER — Ambulatory Visit (INDEPENDENT_AMBULATORY_CARE_PROVIDER_SITE_OTHER): Payer: Self-pay | Admitting: Surgery

## 2023-07-01 ENCOUNTER — Ambulatory Visit: Payer: Medicare Other

## 2023-07-01 ENCOUNTER — Ambulatory Visit
Admission: RE | Admit: 2023-07-01 | Discharge: 2023-07-01 | Disposition: A | Discharge status: Home / Self Care | Payer: Medicare Other | Source: Ambulatory Visit | Attending: Surgery | Admitting: Surgery

## 2023-07-01 VITALS — BP 160/90 | HR 69 | Resp 20 | Wt 164.0 lb

## 2023-07-01 DIAGNOSIS — I471 Supraventricular tachycardia, unspecified: Secondary | ICD-10-CM | POA: Diagnosis present

## 2023-07-01 DIAGNOSIS — I517 Cardiomegaly: Secondary | ICD-10-CM | POA: Diagnosis not present

## 2023-07-01 DIAGNOSIS — I2510 Atherosclerotic heart disease of native coronary artery without angina pectoris: Secondary | ICD-10-CM | POA: Insufficient documentation

## 2023-07-01 DIAGNOSIS — E785 Hyperlipidemia, unspecified: Secondary | ICD-10-CM | POA: Insufficient documentation

## 2023-07-01 DIAGNOSIS — I081 Rheumatic disorders of both mitral and tricuspid valves: Secondary | ICD-10-CM | POA: Insufficient documentation

## 2023-07-01 DIAGNOSIS — I5030 Unspecified diastolic (congestive) heart failure: Secondary | ICD-10-CM

## 2023-07-01 DIAGNOSIS — I3139 Other pericardial effusion (noninflammatory): Secondary | ICD-10-CM | POA: Diagnosis not present

## 2023-07-01 DIAGNOSIS — I252 Old myocardial infarction: Secondary | ICD-10-CM | POA: Insufficient documentation

## 2023-07-01 DIAGNOSIS — Z87891 Personal history of nicotine dependence: Secondary | ICD-10-CM | POA: Insufficient documentation

## 2023-07-01 DIAGNOSIS — I350 Nonrheumatic aortic (valve) stenosis: Secondary | ICD-10-CM

## 2023-07-01 DIAGNOSIS — I7121 Aneurysm of the ascending aorta, without rupture: Secondary | ICD-10-CM

## 2023-07-01 DIAGNOSIS — Z952 Presence of prosthetic heart valve: Secondary | ICD-10-CM

## 2023-07-01 DIAGNOSIS — R9431 Abnormal electrocardiogram [ECG] [EKG]: Secondary | ICD-10-CM | POA: Diagnosis not present

## 2023-07-01 LAB — ECHOCARDIOGRAM COMPLETE
AR max vel: 1.76 cm2
AV Area VTI: 1.87 cm2
AV Area mean vel: 1.62 cm2
AV Mean grad: 9 mmHg
AV Peak grad: 16.1 mmHg
Ao pk vel: 2.01 m/s
Area-P 1/2: 4.6 cm2
Calc EF: 47 %
MV M vel: 3.59 m/s
MV Peak grad: 51.6 mmHg
S' Lateral: 3.3 cm
Single Plane A2C EF: 52.2 %
Single Plane A4C EF: 47.9 %

## 2023-07-01 NOTE — Progress Notes (Signed)
HPI: Patient returns for routine postoperative follow-up having undergone aortic valve replacement using a 23 mm INSPIRIS RESILIA pericardial valve and replacement of an ascending aortic aneurysm using deep hypothermic circulatory arrest on 05/18/2023. The patient's early postoperative recovery while in the hospital was notable for development of postoperative atrial fibrillation converted with amiodarone.  On postoperative day 2 he was noted to have right hand weakness and a CT of the head was unremarkable.  MRI of the brain showed scattered small acute infarcts in the left cerebellar hemisphere as well as in both cerebral hemispheres that were felt to be embolic in etiology.  He was started on Coumadin.  His last INR on 06/29/2023 was 2.1. Since hospital discharge the patient reports that he has been making good progress with physical therapy.  He still has some weakness in his right upper extremity but it is improving slowly.  He has some weakness in the right lower extremity which has been present for quite a while preoperatively but it does seem a little worse since his stroke.  He is walking with a cane.  He has noted a poor appetite and that foods do not taste very good to him.  He has also had constipation.   Current Outpatient Medications  Medication Sig Dispense Refill   acetaminophen (TYLENOL) 325 MG tablet Take 1-2 tablets (325-650 mg total) by mouth every 4 (four) hours as needed for mild pain.     amoxicillin (AMOXIL) 500 MG capsule 4 500 Mg Tablets(2000 Mg) at once 4 capsule 0   aspirin EC 81 MG EC tablet Take 1 tablet (81 mg total) by mouth daily.     atorvastatin (LIPITOR) 80 MG tablet Take 1 tablet (80 mg total) by mouth daily. 90 tablet 3   baclofen (LIORESAL) 10 MG tablet Take 1 tablet (10 mg total) by mouth daily.     bisacodyl (DULCOLAX) 5 MG EC tablet Take 1 tablet (5 mg total) by mouth daily at 2 PM.     finasteride (PROSCAR) 5 MG tablet Take 1 tablet (5 mg total) by mouth  daily. 30 tablet 0   metoprolol tartrate (LOPRESSOR) 25 MG tablet Take 0.5 tablets (12.5 mg total) by mouth 2 (two) times daily. 90 tablet 3   warfarin (COUMADIN) 2.5 MG tablet Take 1 tablet (2.5 mg total) by mouth daily at 4 PM. 30 tablet 5   No current facility-administered medications for this visit.    Physical Exam: BP (!) 160/90   Pulse 69   Resp 20   Wt 164 lb (74.4 kg)   SpO2 97% Comment: RA  BMI 24.94 kg/m  He looks well. Cardiac exam shows a regular rate and rhythm with normal heart sounds.  There is no murmur. Lungs are clear. The chest incision is healing well and the sternum is stable. There is no peripheral edema.  Diagnostic Tests:  Narrative & Impression  CLINICAL DATA:  Aortic valve replacement   EXAM: CHEST - 2 VIEW   COMPARISON:  06/03/2023   FINDINGS: Normal heart size status post sternotomy and aortic valve replacement. Stable trace left pleural effusion. Lungs are otherwise clear. No pneumothorax.   IMPRESSION: Stable trace left pleural effusion. Otherwise, no acute cardiopulmonary findings.     Electronically Signed   By: Duanne Guess D.O.   On: 07/01/2023 12:06   ECHOCARDIOGRAM REPORT       Patient Name:   SIG CLISHAM Date of Exam: 07/01/2023  Medical Rec #:  [id number]  Height:       68.0 in  Accession #:    [id number]     Weight:       162.6 lb  Date of Birth:  [birth year]     BSA:          1.872 m  Patient Age:    66 years       BP:           120/72 mmHg  Patient Gender: M              HR:           61 bpm.  Exam Location:  [place]   Procedure: 2D Echo, Cardiac Doppler and Color Doppler   Indications:    R94.31 Abnormal EKG    History:        Patient has prior history of Echocardiogram examinations,  most                 recent 11/14/2022. CAD and Previous Myocardial Infarction,                  Stroke, Arrythmias:PSVT and Atrial Fibrillation; Risk                  Factors:Dyslipidemia and Former Smoker. Underwent   ascending                 aortic aneurysm repair on May 18, 2023 along with AVR.                  Aortic Valve: 23 mm Inspiris Resilia + replacement of  ascending                 aorta with Hemashield platinum graft valve is present in  the                 aortic position. Procedure Date: 05/18/23.    Sonographer:    Vella Kohler BS, RVT, RDCS  Referring Phys: [id number] ELIZABETH PECK   IMPRESSIONS     1. Left ventricular ejection fraction, by estimation, is 45 to 50%. The  left ventricle has mildly decreased function. The left ventricle  demonstrates regional wall motion abnormalities (see scoring  diagram/findings for description). There is mild  concentric left ventricular hypertrophy. Left ventricular diastolic  parameters are consistent with Grade I diastolic dysfunction (impaired  relaxation).   2. Right ventricular systolic function is moderately reduced. The right  ventricular size is normal. There is normal pulmonary artery systolic  pressure. The estimated right ventricular systolic pressure is 25.7 mmHg.   3. A small to moderate pericardial effusion is present. The pericardial  effusion is posterior to the left ventricle. There is no evidence of  cardiac tamponade.   4. The mitral valve is grossly normal. Mild mitral valve regurgitation.   5. The aortic valve has been repaired/replaced. Aortic valve  regurgitation is not visualized. There is a 23 mm Inspiris Resilia +  replacement of ascending aorta with Hemashield platinum graft valve  present in the aortic position. Procedure Date: 05/18/23.   Aortic valve mean gradient measures 9.0 mmHg.   6. The inferior vena cava is normal in size with greater than 50%  respiratory variability, suggesting right atrial pressure of 3 mmHg.   Comparison(s): Prior images reviewed side by side. LVEF mildly reduced in  range of 45-50%. Bioprosthetic AVR in place with no aortic regurgitation  and normal mean AV gradient of 9 mmHg. Small  to moderate posterior  pericardial effusion.   FINDINGS   Left Ventricle: Left ventricular ejection fraction, by estimation, is 45  to 50%. The left ventricle has mildly decreased function. The left  ventricle demonstrates regional wall motion abnormalities. The left  ventricular internal cavity size was normal  in size. There is mild concentric left ventricular hypertrophy. Left  ventricular diastolic parameters are consistent with Grade I diastolic  dysfunction (impaired relaxation).     LV Wall Scoring:  The mid inferoseptal segment, basal inferior segment, and basal  inferoseptal  segment are hypokinetic. The entire anterior wall, entire lateral wall,  entire anterior septum, entire apex, and mid and distal inferior wall are  normal.   Right Ventricle: The right ventricular size is normal. No increase in  right ventricular wall thickness. Right ventricular systolic function is  moderately reduced. There is normal pulmonary artery systolic pressure.  The tricuspid regurgitant velocity is  2.38 m/s, and with an assumed right atrial pressure of 3 mmHg, the  estimated right ventricular systolic pressure is 25.7 mmHg.   Left Atrium: Left atrial size was normal in size.   Right Atrium: Right atrial size was normal in size.   Pericardium: A small pericardial effusion is present. The pericardial  effusion is posterior to the left ventricle. There is no evidence of  cardiac tamponade.   Mitral Valve: The mitral valve is grossly normal. There is mild thickening  of the mitral valve leaflet(s). Mild mitral valve regurgitation.   Tricuspid Valve: The tricuspid valve is grossly normal. Tricuspid valve  regurgitation is mild.   Aortic Valve: The aortic valve has been repaired/replaced. Aortic valve  regurgitation is not visualized. Aortic valve mean gradient measures 9.0  mmHg. Aortic valve peak gradient measures 16.1 mmHg. Aortic valve area, by  VTI measures 1.87 cm. There is a   23 mm Inspiris Resilia + replacement of ascending aorta with Hemashield  platinum graft valve present in the aortic position. Procedure Date:  05/18/23.   Pulmonic Valve: The pulmonic valve was grossly normal. Pulmonic valve  regurgitation is not visualized.   Aorta: The aortic root and ascending aorta are structurally normal, with  no evidence of dilitation.   Venous: The inferior vena cava is normal in size with greater than 50%  respiratory variability, suggesting right atrial pressure of 3 mmHg.   IAS/Shunts: No atrial level shunt detected by color flow Doppler.     LEFT VENTRICLE  PLAX 2D  LVIDd:         4.90 cm     Diastology  LVIDs:         3.30 cm     LV e' medial:    7.62 cm/s  LV PW:         1.20 cm     LV E/e' medial:  15.6  LV IVS:        1.00 cm     LV e' lateral:   6.74 cm/s  LVOT diam:     1.80 cm     LV E/e' lateral: 17.7  LV SV:         74  LV SV Index:   40  LVOT Area:     2.54 cm    LV Volumes (MOD)  LV vol d, MOD A2C: 65.0 ml  LV vol d, MOD A4C: 60.1 ml  LV vol s, MOD A2C: 31.1 ml  LV vol s, MOD A4C: 31.3 ml  LV SV MOD A2C:     33.9 ml  LV SV  MOD A4C:     60.1 ml  LV SV MOD BP:      30.3 ml   RIGHT VENTRICLE  RV Basal diam:  3.80 cm  RV Mid diam:    2.60 cm  RV S prime:     6.42 cm/s  TAPSE (M-mode): 1.3 cm   LEFT ATRIUM             Index        RIGHT ATRIUM           Index  LA diam:        4.10 cm 2.19 cm/m   RA Area:     19.30 cm  LA Vol (A2C):   48.9 ml 26.13 ml/m  RA Volume:   58.10 ml  31.04 ml/m  LA Vol (A4C):   29.4 ml 15.71 ml/m  LA Biplane Vol: 38.6 ml 20.62 ml/m   AORTIC VALVE                     PULMONIC VALVE  AV Area (Vmax):    1.76 cm      PV Vmax:       0.67 m/s  AV Area (Vmean):   1.62 cm      PV Peak grad:  1.8 mmHg  AV Area (VTI):     1.87 cm  AV Vmax:           200.50 cm/s  AV Vmean:          147.000 cm/s  AV VTI:            0.398 m  AV Peak Grad:      16.1 mmHg  AV Mean Grad:      9.0 mmHg  LVOT Vmax:          139.00 cm/s  LVOT Vmean:        93.400 cm/s  LVOT VTI:          0.292 m  LVOT/AV VTI ratio: 0.73    AORTA  Ao Root diam: 3.50 cm  Ao Asc diam:  3.10 cm   MITRAL VALVE                TRICUSPID VALVE  MV Area (PHT): 4.60 cm     TR Peak grad:   22.7 mmHg  MV Decel Time: 165 msec     TR Vmax:        238.00 cm/s  MR Peak grad: 51.6 mmHg  MR Vmax:      359.00 cm/s   SHUNTS  MV E velocity: 119.00 cm/s  Systemic VTI:  0.29 m  MV A velocity: 123.00 cm/s  Systemic Diam: 1.80 cm  MV E/A ratio:  0.97   Nona Dell MD  Electronically signed by Nona Dell MD  Signature Date/Time: 07/01/2023/12:49:43 PM        Final     Impression:  Overall I think he is doing well at 6 weeks postoperatively.  He appears to be making a good recovery from his stroke and will benefit from continued physical therapy.  He is in sinus rhythm today.  Since he is 6 weeks from surgery I think we can discontinue the amiodarone which will also help with his appetite.  He should remain on Coumadin indefinitely.  I also told him he could discontinue the iron at this time which should help with his appetite and constipation.  I asked him to increase the metoprolol to 25 mg twice a  day since he was hypertensive today and I am discontinuing the amiodarone.  Plan:  I will see him back in 6 weeks for follow-up.   Alleen Borne, MD [facility] [phone]

## 2023-07-02 ENCOUNTER — Encounter (HOSPITAL_COMMUNITY)
Admission: RE | Admit: 2023-07-02 | Discharge: 2023-07-02 | Disposition: A | Discharge status: Home / Self Care | Payer: Medicare Other | Source: Ambulatory Visit | Attending: Cardiology | Admitting: Cardiology

## 2023-07-02 ENCOUNTER — Encounter (HOSPITAL_COMMUNITY): Payer: Self-pay | Admitting: *Deleted

## 2023-07-02 DIAGNOSIS — Z952 Presence of prosthetic heart valve: Secondary | ICD-10-CM

## 2023-07-02 NOTE — Progress Notes (Signed)
Completed virtual orientation today.  EP evaluation is scheduled for 07/06/23 at 1230.  Documentation for diagnosis can be found in [facility] encounter 05/18/23.

## 2023-07-03 ENCOUNTER — Encounter: Payer: Self-pay | Admitting: Physical Medicine & Rehabilitation

## 2023-07-03 ENCOUNTER — Other Ambulatory Visit (HOSPITAL_COMMUNITY): Payer: Medicare Other

## 2023-07-03 ENCOUNTER — Encounter: Payer: Medicare Other | Attending: Physical Medicine & Rehabilitation | Admitting: Physical Medicine & Rehabilitation

## 2023-07-03 VITALS — BP 165/95 | HR 62 | Ht 68.0 in | Wt 163.0 lb

## 2023-07-03 DIAGNOSIS — I639 Cerebral infarction, unspecified: Secondary | ICD-10-CM | POA: Diagnosis not present

## 2023-07-03 NOTE — Progress Notes (Signed)
Subjective:    Patient ID: [name], male    DOB: [birth year], 67 y.o.   MRN: [id number] Admit date: 05/25/2023 Discharge date: 06/04/2023  67 y.o. male who presented to the [facility] for AVR and ascending aortic aneurysm repair on 05/18/2023. He had evidence of bicuspid aortic valve stenosis with 5.1 cm ascending aortic aneurysm and underwent repair by Dr. Laneta Simmers on 05/18/2023. He was extubated later that day. He had brief episode of hematuria after difficult Foley placement. Remains on Proscar for urinary retention. Intraoperative TEE with EF approximately 50%. Pacer wires and chest tubes removed on 7/03. Chest x-ray was stable and he was transferred out to stepdown unit. Acute blood loss anemia treated with oral iron replacement. Aspirin dosing decreased to 81 mg due to post-operative thrombocytopenia. Later in the evening on 7/03 he was noted to have right hand weakness and rapid response was called. He was noted to have atrial fibrillation with RVR and was hypotensive. He was started on an amiodarone infusion and cardiology consulted. He was transition to amiodarone 200 mg twice daily on 7/04. CT of head without evidence of acute infarct or hemorrhage, however brain MRI showed scattered small acute infarcts in the left cerebellar hemisphere in both cerebral hemispheres likely embolic in etiology. Neurology was consulted. He was felt to be a candidate for Coumadin due to the recent new aortic valve as well as postoperative A-fib. No large vessel occlusion noted. INR goal is 2.0. BLE venous duplex negative for DVT on 7/06. Hemoglobin is stable and platelet count is now normal.  HPI  Mod I dressing and bathing , no falls Discharged from home health and starting cardiac rehab on Monday  Seen by CVTS 2 d ago, off amio, on chronic warfarin Cardiac rehab ordered  Pt still ambulating with AFO and cane , does not feel like he can get on treadmill.   Pain Inventory Average Pain 0 Pain Right Now 0 My pain  is  No pain  LOCATION OF PAIN  No pain  BOWEL Number of stools per week: 7 Oral laxative use Yes  Type of laxative dulcolax   BLADDER Normal     Mobility use a cane use a walker Do you have any goals in this area?  yes  Function retired I need assistance with the following:  shopping  Neuro/Psych weakness numbness tingling depression loss of taste or smell  Prior Studies Any changes since last visit?  no  Physicians involved in your care Any changes since last visit?  yes   Family History  Problem Relation Age of Onset   Renal Disease Daughter    Hypertension Daughter    Social History   Socioeconomic History   Marital status: Married    Spouse name: Not on file   Number of children: 1   Years of education: 14   Highest education level: Not on file  Occupational History   Occupation: Retired   Tobacco Use   Smoking status: Former    Current packs/day: 0.00    Types: Cigarettes    Quit date: 11/18/1979    Years since quitting: 43.6   Smokeless tobacco: Never  Vaping Use   Vaping status: Never Used  Substance and Sexual Activity   Alcohol use: No    Alcohol/week: 0.0 standard drinks of alcohol   Drug use: Yes    Frequency: 7.0 times per week    Types: Marijuana    Comment: every day   Sexual activity: Not on file  Other Topics Concern   Not on file  Social History Narrative   Lives w/ wife   Caffeine use: 1 cup coffee every morning   Right handed   Social Determinants of Health   Financial Resource Strain: Not on file  Food Insecurity: No Food Insecurity (05/18/2023)   Hunger Vital Sign    Worried About Running Out of Food in the Last Year: Never true    Ran Out of Food in the Last Year: Never true  Transportation Needs: No Transportation Needs (05/18/2023)   PRAPARE - Administrator, Civil Service (Medical): No    Lack of Transportation (Non-Medical): No  Physical Activity: Not on file  Stress: Not on file  Social  Connections: Not on file   Past Surgical History:  Procedure Laterality Date   AORTIC VALVE REPLACEMENT N/A 05/18/2023   Procedure: AORTIC VALVE REPLACEMENT (AVR) USING INSPIRIS RESILIA 23 MM AORTIC VALVE;  Surgeon: Alleen Borne, MD;  Location: [facility] OR;  Service: Open Heart Surgery;  Laterality: N/A;   BACK SURGERY     1989   CERVICAL DISC SURGERY  04/29/2021   COLONOSCOPY N/A 12/11/2020   Procedure: COLONOSCOPY;  Surgeon: Franky Macho, MD;  Location: [facility];  Service: Gastroenterology;  Laterality: N/A;   LEFT HEART CATHETERIZATION WITH CORONARY ANGIOGRAM N/A 03/17/2014   Procedure: LEFT HEART CATHETERIZATION WITH CORONARY ANGIOGRAM;  Surgeon: Corky Crafts, MD;  Location: [facility] CATH LAB;  Service: Cardiovascular;  Laterality: N/A;   POLYPECTOMY  12/11/2020   Procedure: POLYPECTOMY;  Surgeon: Franky Macho, MD;  Location: [facility];  Service: Gastroenterology;;   REPLACEMENT ASCENDING AORTA N/A 05/18/2023   Procedure: REPLACEMENT OF ASCENDING AORTIC ANEURYSM USING A 30 X 10 MM HEMASHIELD PLATINUM GRAFT;  Surgeon: Alleen Borne, MD;  Location: [facility] OR;  Service: Open Heart Surgery;  Laterality: N/A;  CIRC ARREST   RIGHT HEART CATH AND CORONARY ANGIOGRAPHY N/A 04/03/2023   Procedure: RIGHT HEART CATH AND CORONARY ANGIOGRAPHY;  Surgeon: Tonny Bollman, MD;  Location: [facility] INVASIVE CV LAB;  Service: Cardiovascular;  Laterality: N/A;   ROBOT ASSISTED LAPAROSCOPIC NEPHRECTOMY  01/14/2012   Procedure: ROBOTIC ASSISTED LAPAROSCOPIC NEPHRECTOMY;  Surgeon: Milford Cage, MD;  Location: [facility] ORS;  Service: Urology;  Laterality: Right;  Robot Laparoscopic Right Partial Nephrectomy     TEE WITHOUT CARDIOVERSION N/A 05/18/2023   Procedure: TRANSESOPHAGEAL ECHOCARDIOGRAM;  Surgeon: Alleen Borne, MD;  Location: [facility] OR;  Service: Open Heart Surgery;  Laterality: N/A;   TONSILLECTOMY     Past Medical History:  Diagnosis Date   Arthritis    Coronary atherosclerosis of native coronary  artery    a. NSTEMI (02/2014):  LHC (02/2014):  Mild disease in LAD and CFX; prox RCA occluded with R-R collats, dist RCA filled by L-R collats, inf HK, EF 55%, LVEDP 15 mmHg.  PCI:  Unsuccessful angioplasty of RCA (late presentation of inf MI) - tx medically.   Essential hypertension    GERD (gastroesophageal reflux disease)    Hyperlipidemia    NSTEMI (non-ST elevated myocardial infarction) (HCC) 03/11/14   Renal cell carcinoma of right kidney (HCC)    Partial nephrectomy in 2013   There were no vitals taken for this visit.  Opioid Risk Score:   Fall Risk Score:  `1  Depression screen PHQ 2/9      No data to display          Review of Systems     Objective:   Physical Exam  Vitals and nursing note reviewed.  Constitutional:      Appearance: Normal appearance.  HENT:     Head: Normocephalic and atraumatic.  Eyes:     Extraocular Movements: Extraocular movements intact.     Conjunctiva/sclera: Conjunctivae normal.     Pupils: Pupils are equal, round, and reactive to light.  Musculoskeletal:     Right lower leg: No edema.     Left lower leg: No edema.  Skin:    General: Skin is warm and dry.  Neurological:     Mental Status: He is alert.  Psychiatric:        Mood and Affect: Mood normal.        Behavior: Behavior normal.   Speech without dysarthria or aphasia Decreased fine motor right upper extremity with finger to thumb opposition. MSK has reduced range of motion at the MCP PIP and DIP in the right hand only.  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  Finishing with HH PT, OT recommend OP PT, OT.  Will make referral would benefit in completing neuro rehab to  improve gait and balance prior to starting cardiac rehab

## 2023-07-06 ENCOUNTER — Encounter (HOSPITAL_COMMUNITY): Payer: Medicare Other

## 2023-07-13 ENCOUNTER — Ambulatory Visit (HOSPITAL_COMMUNITY): Payer: Medicare Other | Admitting: Physical Therapy

## 2023-07-15 ENCOUNTER — Telehealth: Payer: Self-pay | Admitting: Nurse Practitioner

## 2023-07-15 MED ORDER — METOPROLOL TARTRATE 25 MG PO TABS: 25.0000 mg | ORAL_TABLET | Freq: Two times a day (BID) | ORAL | 2 refills | Status: DC

## 2023-07-15 NOTE — Telephone Encounter (Signed)
Patient is calling to talk with Sharlene Dory or nurse

## 2023-07-15 NOTE — Telephone Encounter (Signed)
Patient called stated that he had went to see Dr. Laneta Simmers on 08/14 and he increased his Metoprolol tartrate 25 mg twice a day. His concern was that they only gave him a 30 day supply. Wanted to see if we could send in a refill to [facility] in [place]. Script sent. Patient verbalized understanding.

## 2023-07-16 ENCOUNTER — Encounter (HOSPITAL_COMMUNITY): Payer: Self-pay | Admitting: Occupational Therapy

## 2023-07-16 ENCOUNTER — Ambulatory Visit (HOSPITAL_COMMUNITY): Payer: Medicare Other

## 2023-07-16 ENCOUNTER — Ambulatory Visit (HOSPITAL_COMMUNITY): Payer: Medicare Other | Attending: Physical Medicine & Rehabilitation | Admitting: Occupational Therapy

## 2023-07-16 ENCOUNTER — Encounter (HOSPITAL_COMMUNITY): Payer: Self-pay

## 2023-07-16 ENCOUNTER — Other Ambulatory Visit: Payer: Self-pay

## 2023-07-16 DIAGNOSIS — R278 Other lack of coordination: Secondary | ICD-10-CM | POA: Diagnosis present

## 2023-07-16 DIAGNOSIS — Z7409 Other reduced mobility: Secondary | ICD-10-CM | POA: Diagnosis present

## 2023-07-16 DIAGNOSIS — R29818 Other symptoms and signs involving the nervous system: Secondary | ICD-10-CM | POA: Insufficient documentation

## 2023-07-16 DIAGNOSIS — I639 Cerebral infarction, unspecified: Secondary | ICD-10-CM | POA: Insufficient documentation

## 2023-07-16 DIAGNOSIS — M6281 Muscle weakness (generalized): Secondary | ICD-10-CM | POA: Diagnosis present

## 2023-07-16 NOTE — Therapy (Signed)
OUTPATIENT OCCUPATIONAL THERAPY NEURO EVALUATION  Patient Name: [name] MRN: [id number] DOB:Jan 10, 1956, 67 y.o., male Today's Date: 07/16/2023    END OF SESSION:  OT End of Session - 07/16/23 1615     Visit Number 1    Number of Visits 8    Date for OT Re-Evaluation 08/15/23    Authorization Type 1) Medicare A & B  2) [employer]    Progress Note Due on Visit 10    OT Start Time 1348    OT Stop Time 1425    OT Time Calculation (min) 37 min    Activity Tolerance Patient tolerated treatment well    Behavior During Therapy WFL for tasks assessed/performed             Past Medical History:  Diagnosis Date   Arthritis    Coronary atherosclerosis of native coronary artery    a. NSTEMI (02/2014):  LHC (02/2014):  Mild disease in LAD and CFX; prox RCA occluded with R-R collats, dist RCA filled by L-R collats, inf HK, EF 55%, LVEDP 15 mmHg.  PCI:  Unsuccessful angioplasty of RCA (late presentation of inf MI) - tx medically.   Essential hypertension    GERD (gastroesophageal reflux disease)    Hyperlipidemia    NSTEMI (non-ST elevated myocardial infarction) (HCC) 03/11/14   Renal cell carcinoma of right kidney (HCC)    Partial nephrectomy in 2013   Past Surgical History:  Procedure Laterality Date   AORTIC VALVE REPLACEMENT N/A 05/18/2023   Procedure: AORTIC VALVE REPLACEMENT (AVR) USING INSPIRIS RESILIA 23 MM AORTIC VALVE;  Surgeon: Alleen Borne, MD;  Location: [facility] OR;  Service: Open Heart Surgery;  Laterality: N/A;   BACK SURGERY     1989   CERVICAL DISC SURGERY  04/29/2021   COLONOSCOPY N/A 12/11/2020   Procedure: COLONOSCOPY;  Surgeon: Franky Macho, MD;  [facility];  Service: Gastroenterology;  Laterality: N/A;   LEFT HEART CATHETERIZATION WITH CORONARY ANGIOGRAM N/A 03/17/2014   Procedure: LEFT HEART CATHETERIZATION WITH CORONARY ANGIOGRAM;  Surgeon: Corky Crafts, MD;  Location: [facility] CATH LAB;  Service: Cardiovascular;  Laterality: N/A;   POLYPECTOMY   12/11/2020   Procedure: POLYPECTOMY;  Surgeon: Franky Macho, MD;  [facility];  Service: Gastroenterology;;   REPLACEMENT ASCENDING AORTA N/A 05/18/2023   Procedure: REPLACEMENT OF ASCENDING AORTIC ANEURYSM USING A 30 X 10 MM HEMASHIELD PLATINUM GRAFT;  Surgeon: Alleen Borne, MD;  Location: [facility] OR;  Service: Open Heart Surgery;  Laterality: N/A;  CIRC ARREST   RIGHT HEART CATH AND CORONARY ANGIOGRAPHY N/A 04/03/2023   Procedure: RIGHT HEART CATH AND CORONARY ANGIOGRAPHY;  Surgeon: Tonny Bollman, MD;  Location: [facility] INVASIVE CV LAB;  Service: Cardiovascular;  Laterality: N/A;   ROBOT ASSISTED LAPAROSCOPIC NEPHRECTOMY  01/14/2012   Procedure: ROBOTIC ASSISTED LAPAROSCOPIC NEPHRECTOMY;  Surgeon: Milford Cage, MD;  Location: [facility] ORS;  Service: Urology;  Laterality: Right;  Robot Laparoscopic Right Partial Nephrectomy     TEE WITHOUT CARDIOVERSION N/A 05/18/2023   Procedure: TRANSESOPHAGEAL ECHOCARDIOGRAM;  Surgeon: Alleen Borne, MD;  Location: [facility] OR;  Service: Open Heart Surgery;  Laterality: N/A;   TONSILLECTOMY     Patient Active Problem List   Diagnosis Date Noted   Atrial fibrillation (HCC) 06/08/2023   Encounter for therapeutic drug monitoring 06/08/2023   CVA (cerebral vascular accident) (HCC) 05/25/2023   S/P AVR (aortic valve replacement) 05/18/2023   Chronic cough 02/20/2023   Myelopathy concurrent with and due to spinal stenosis  of cervical region [facility]) 02/15/2021   Right leg weakness 02/07/2021   Allodynia 02/07/2021   Gait disturbance 02/07/2021   White matter abnormality on MRI of brain 02/07/2021   Hyperreflexia 02/07/2021   Special screening for malignant neoplasms, colon    Adenomatous polyp of transverse colon    Diverticulosis of large intestine without diverticulitis    PSVT (paroxysmal supraventricular tachycardia) 05/29/2014   Precordial pain 04/19/2014   Coronary atherosclerosis of native coronary artery 04/06/2014   Essential hypertension,  benign 04/06/2014   HLD (hyperlipidemia) 04/06/2014   PCP: [name], PA-C REFERRING PROVIDER: Dr. Claudette Laws  ONSET DATE: 05/20/23  REFERRING DIAG: I63.9 (ICD-10-CM) - Cerebrovascular accident (CVA), unspecified mechanism (HCC)   THERAPY DIAG:  Other symptoms and signs involving the nervous system  Other lack of coordination  Rationale for Evaluation and Treatment: Rehabilitation  SUBJECTIVE:   SUBJECTIVE STATEMENT: S: Today is not a great day but it's an ok day.  Pt accompanied by: self  PERTINENT HISTORY: Pt is a 67 year old male who presented to the [facility] for AVR and ascending aortic aneurysm repair on 05/18/2023. He had evidence of bicuspid aortic valve stenosis with 5.1 cm ascending aortic aneurysm and underwent repair by Dr. Laneta Simmers on 05/18/2023. He was extubated later that day. He had brief episode of hematuria after difficult Foley placement. Remains on Proscar for urinary retention. Intraoperative TEE with EF approximately 50%. Pacer wires and chest tubes removed on 7/03. Chest x-ray was stable and he was transferred out to stepdown unit. Acute blood loss anemia treated with oral iron replacement. Aspirin dosing decreased to 81 mg due to post-operative thrombocytopenia. Later in the evening on 7/03 he was noted to have right hand weakness and rapid response was called. He was noted to have atrial fibrillation with RVR and was hypotensive. He was started on an amiodarone infusion and cardiology consulted. He was transition to amiodarone 200 mg twice daily on 7/04. CT of head without evidence of acute infarct or hemorrhage, however brain MRI showed scattered small acute infarcts in the left cerebellar hemisphere in both cerebral hemispheres likely embolic in etiology.   PRECAUTIONS: Sternal and Fall  WEIGHT BEARING RESTRICTIONS: No  PAIN:  Are you having pain? No  FALLS: Has patient fallen in last 6 months? No  LIVING ENVIRONMENT: Lives with: lives with their  family Lives in: House/apartment Stairs: Yes: External: 2 steps; bilateral but cannot reach both Has following equipment at home: Single point cane and [place] - 2 wheeled  PLOF: Independent  PATIENT GOALS: To get stronger.   OBJECTIVE:   HAND DOMINANCE: Right  ADLs: Overall ADLs: Pt reports he requires increased time for ADLs. Is not driving yet. Pt reports meal preparation is difficult due to being fatigued. Picking up items is difficult, eating with right hand is difficult. Has difficulty with manipulating items.   ACTIVITY TOLERANCE: Activity tolerance: Limited due to recent heart sx  FUNCTIONAL OUTCOME MEASURES: FOTO: 63/100  UPPER EXTREMITY ROM:    BUE A/ROM is [facility]  UPPER EXTREMITY MMT:     MMT Right eval  Shoulder flexion 5/5  Shoulder abduction 5/5  Shoulder internal rotation 5/5  Shoulder external rotation 4+/5  Elbow flexion 5/5  Elbow extension 5/5  Wrist flexion 4/5  Wrist extension 5/5  Wrist ulnar deviation 4/5  Wrist radial deviation 4/5  Wrist pronation 5/5  Wrist supination 5/5  (Blank rows = not tested)  HAND FUNCTION: Grip strength: Right: 30 lbs; Left: 50 lbs, Lateral pinch: Right:  23 lbs, Left: 9 lbs, and 3 point pinch: Right: 12 lbs, Left: 3 lbs  COORDINATION: 9 Hole Peg test: Right: 1' 04 sec; Left: 31.42 sec  SENSATION: Tingling in hands/feet at baseline  MUSCLE TONE: RUE: Mild  COGNITION: Overall cognitive status: Within functional limits for tasks assessed  VISION: Subjective report: No change Baseline vision: Wears glasses all the time    TODAY'S TREATMENT:                                                                                                                              DATE: N/A-eval only   PATIENT EDUCATION: Education details: Sport and exercise psychologist A/ROM Person educated: Patient Education method: Explanation, Demonstration, and Handouts Education comprehension: verbalized understanding and returned demonstration  HOME  EXERCISE PROGRAM: Eval: finger A/ROM   GOALS: Goals reviewed with patient? Yes  SHORT TERM GOALS: Target date: 08/15/23  Pt will be provided with and educated on HEP to improve mobility and functional use of right hand during ADLs.   Goal status: INITIAL  2.  Pt will increase right grip strength by 20# and pinch strength by 4# or greater to improve ability to grasp and hold items for hygiene and meal preparation tasks.   Goal status: INITIAL  3.  Pt will increase right fine motor coordination required for tying shoes or manipulating small objects by completing 9 hole peg test in 45" or less.   Goal status: INITIAL  4.  Pt will be educated on AE available to improve success and independence in ADL completion.   Goal status: INITIAL    ASSESSMENT:  CLINICAL IMPRESSION: Patient is a 67 y.o. male who was seen today for occupational therapy evaluation s/p CVA. Pt presenting with good right shoulder/elbow/wrist strength, however has notable deficits with grip, pinch, and fine motor coordination in the right hand.  Pt will benefit from skilled OT services to improve functional use of right dominant hand.   PERFORMANCE DEFICITS: in functional skills including ADLs, IADLs, coordination, proprioception, sensation, tone, strength, Fine motor control, and UE functional use  IMPAIRMENTS: are limiting patient from ADLs, IADLs, and leisure.   CO-MORBIDITIES: may have co-morbidities  that affects occupational performance. Patient will benefit from skilled OT to address above impairments and improve overall function.  MODIFICATION OR ASSISTANCE TO COMPLETE EVALUATION: No modification of tasks or assist necessary to complete an evaluation.  OT OCCUPATIONAL PROFILE AND HISTORY: Problem focused assessment: Including review of records relating to presenting problem.  CLINICAL DECISION MAKING: LOW - limited treatment options, no task modification necessary  REHAB POTENTIAL: Good  EVALUATION  COMPLEXITY: Low    PLAN:  OT FREQUENCY: 2x/week  OT DURATION: 4 weeks  PLANNED INTERVENTIONS: therapeutic exercise, therapeutic activity, neuromuscular re-education, splinting, electrical stimulation, ultrasound, patient/family education, and DME and/or AE instructions, self-care  RECOMMENDED OTHER SERVICES: PT  CONSULTED AND AGREED WITH PLAN OF CARE: Patient  PLAN FOR NEXT SESSION: Follow up on HEP, begin grip and pinch strengthening,  coordination tasks   Ezra Sites, OTR/L  [phone] 07/16/2023, 4:16 PM

## 2023-07-16 NOTE — Therapy (Signed)
OUTPATIENT PHYSICAL THERAPY NEURO EVALUATION   Patient Name: [name] MRN: [id number] DOB:[birth year], 67 y.o., male Today's Date: 07/16/2023   PCP: [facility] REFERRING PROVIDER: Erick Colace, MD  END OF SESSION:  PT End of Session - 07/16/23 1620     Visit Number 1    Date for PT Re-Evaluation 09/10/23    Authorization Type Medicare part A & B    Authorization Time Period no visit limit; no auth    Authorization - Visit Number 1    Progress Note Due on Visit 10    PT Start Time 1430    PT Stop Time 1515    PT Time Calculation (min) 45 min    Activity Tolerance Patient tolerated treatment well    Behavior During Therapy WFL for tasks assessed/performed             Past Medical History:  Diagnosis Date   Arthritis    Coronary atherosclerosis of native coronary artery    a. NSTEMI (02/2014):  LHC (02/2014):  Mild disease in LAD and CFX; prox RCA occluded with R-R collats, dist RCA filled by L-R collats, inf HK, EF 55%, LVEDP 15 mmHg.  PCI:  Unsuccessful angioplasty of RCA (late presentation of inf MI) - tx medically.   Essential hypertension    GERD (gastroesophageal reflux disease)    Hyperlipidemia    NSTEMI (non-ST elevated myocardial infarction) (HCC) 03/11/14   Renal cell carcinoma of right kidney (HCC)    Partial nephrectomy in 2013   Past Surgical History:  Procedure Laterality Date   AORTIC VALVE REPLACEMENT N/A 05/18/2023   Procedure: AORTIC VALVE REPLACEMENT (AVR) USING INSPIRIS RESILIA 23 MM AORTIC VALVE;  Surgeon: Alleen Borne, MD;  Location: [facility] OR;  Service: Open Heart Surgery;  Laterality: N/A;   BACK SURGERY     1989   CERVICAL DISC SURGERY  04/29/2021   COLONOSCOPY N/A 12/11/2020   Procedure: COLONOSCOPY;  Surgeon: Franky Macho, MD;  Location: [facility] SUITE;  Service: Gastroenterology;  Laterality: N/A;   LEFT HEART CATHETERIZATION WITH CORONARY ANGIOGRAM N/A 03/17/2014   Procedure: LEFT HEART CATHETERIZATION WITH  CORONARY ANGIOGRAM;  Surgeon: Corky Crafts, MD;  Location: [facility] CATH LAB;  Service: Cardiovascular;  Laterality: N/A;   POLYPECTOMY  12/11/2020   Procedure: POLYPECTOMY;  Surgeon: Franky Macho, MD;  Location: [facility] SU[facility];  Service: Gastroenterology;;   REPLACEMENT ASCENDING AORTA N/A 05/18/2023   Procedure: REPLACEMENT OF ASCENDING AORTIC ANEURYSM USING A 30 X 10 MM HEMASHIELD PLATINUM GRAFT;  Surgeon: Alleen Borne, MD;  Location: [facility] OR;  Service: Open Heart Surgery;  Laterality: N/A;  CIRC ARREST   RIGHT HEART CATH AND CORONARY ANGIOGRAPHY N/A 04/03/2023   Procedure: RIGHT HEART CATH AND CORONARY ANGIOGRAPHY;  Surgeon: Tonny Bollman, MD;  Location: [facility] INVASIVE CV LAB;  Service: Cardiovascular;  Laterality: N/A;   ROBOT ASSISTED LAPAROSCOPIC NEPHRECTOMY  01/14/2012   Procedure: ROBOTIC ASSISTED LAPAROSCOPIC NEPHRECTOMY;  Surgeon: Milford Cage, MD;  Location: [facility] ORS;  Service: Urology;  Laterality: Right;  Robot Laparoscopic Right Partial Nephrectomy     TEE WITHOUT CARDIOVERSION N/A 05/18/2023   Procedure: TRANSESOPHAGEAL ECHOCARDIOGRAM;  Surgeon: Alleen Borne, MD;  Location: [facility] OR;  Service: Open Heart Surgery;  Laterality: N/A;   TONSILLECTOMY     Patient Active Problem List   Diagnosis Date Noted   Atrial fibrillation (HCC) 06/08/2023   Encounter for therapeutic drug monitoring 06/08/2023   CVA (cerebral vascular accident) (HCC) 05/25/2023   S/P  AVR (aortic valve replacement) 05/18/2023   Chronic cough 02/20/2023   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 [facility]) 02/15/2021   Right leg weakness 02/07/2021   Allodynia 02/07/2021   Gait disturbance 02/07/2021   White matter abnormality on MRI of brain 02/07/2021   Hyperreflexia 02/07/2021   Special screening for malignant neoplasms, colon    Adenomatous polyp of transverse colon    Diverticulosis of large intestine without diverticulitis    PSVT (paroxysmal supraventricular  tachycardia) 05/29/2014   Precordial pain 04/19/2014   Coronary atherosclerosis of native coronary artery 04/06/2014   Essential hypertension, benign 04/06/2014   HLD (hyperlipidemia) 04/06/2014    ONSET DATE: 05/18/23  REFERRING DIAG: I63.9 (ICD-10-CM) - Cerebrovascular accident (CVA), unspecified mechanism (HCC)   THERAPY DIAG:  Cerebrovascular accident (CVA), unspecified mechanism (HCC)  Impaired functional mobility, balance, gait, and endurance  Muscle weakness (generalized)  Rationale for Evaluation and Treatment: Rehabilitation  SUBJECTIVE:                                                                                                                                                                                             SUBJECTIVE STATEMENT: Pt went into open heart surgery on 7/1 and had small stroke after surgery on primary RUE. Pt has had leg tests over the past few years regarding nerve conduction and circulation issues. Extensive discussion regarding multiple medical issues surrounding BLE weakness hsitory prior to stroke. Was avid golfer. "Along as I got some hope, I with you all the way."  Pt accompanied by: self  PERTINENT HISTORY:  67 year old male who [facility] a ascending aortic aneurysm repair on 05/18/2023.  Patient with blood loss given treatment with iron replacement.  Later on 7 of 3/24 patient noted with right hand weakness.  Rapid response was called.  Patient was noted to be in atrial fibrillation with RVR and was hypotensive.  MRI showed scattered small acute infarction left cerebral hemisphere in both cerebral hemispheres.  C3 spinal surgery Open heart surgery on 05/18/23.  LBP and BLE weakness Dec'22 to current.   PAIN:  Are you having pain?  Pt reports chronic pain but nothing new.   PRECAUTIONS: None  RED FLAGS: None   WEIGHT BEARING RESTRICTIONS: No  FALLS: Has patient fallen in last 6 months? No  LIVING  ENVIRONMENT: Lives with: lives with their family Lives in: House/apartment Stairs:  2 steps in/out, no concerns.  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 Pt had Dme avilable prior to stroke but was not using those  dependently, only when back pain was bad.   PATIENT GOALS: "golf might be a lofty goal"  OBJECTIVE:   DIAGNOSTIC FINDINGS:   IMPRESSION: Scattered small acute infarcts in the left cerebellar hemisphere and both cerebral hemispheres, likely embolic in etiology.  COGNITION: Overall cognitive status: Within functional limits for tasks assessed   SENSATION: WFL  COORDINATION: WFL  POSTURE: No Significant postural limitations  LOWER EXTREMITY ROM:     Active  Right Eval Left Eval  Hip flexion    Hip extension    Hip abduction    Hip adduction    Hip internal rotation    Hip external rotation    Knee flexion    Knee extension    Ankle dorsiflexion    Ankle plantarflexion    Ankle inversion    Ankle eversion     (Blank rows = not tested)  LOWER EXTREMITY MMT:    MMT Right Eval Left Eval  Hip flexion    Hip extension    Hip abduction    Hip adduction    Hip internal rotation    Hip external rotation    Knee flexion    Knee extension 3+ 3+  Ankle dorsiflexion 2+   Ankle plantarflexion    Ankle inversion    Ankle eversion    (Blank rows = not tested)     GAIT: Gait pattern: decreased step length- Right, decreased ankle dorsiflexion- Right, and Right foot flat Distance walked: 185 feet Assistive device utilized: Walker - 2 wheeled Level of assistance: SBA Comments: Patient ambulating with steady gait pattern without cueing, ambulating with right flatfoot and sliding through RLE swing.  FUNCTIONAL TESTS:  30 seconds chair stand test:10x 2 minute walk test: 179ft  PATIENT SURVEYS:   ABC scale 45% impairment.   TODAY'S TREATMENT:                                                                                                                              DATE: PT evaluation, findings, prognosis, frequency, and palsy.   PATIENT EDUCATION: Education details: Discussion regarding PT findings from evaluation, prognosis, restrictions for heart rate previous aortic aneurysm repair. Person educated: Patient Education method: Explanation Education comprehension: verbalized understanding  HOME EXERCISE PROGRAM: Next session.  GOALS: Goals reviewed with patient? No  SHORT TERM GOALS: Target date: 08/13/2023  Pt and caregiver will be independent with HEP in order to demonstrate participation in Physical Therapy POC.  Baseline: Goal status: INITIAL  2.  Pt will report > 25% improvement in subjective safety/balance concerns in order to demonstrate improved self awareness of balance deficits.  Baseline:  Goal status: INITIAL  LONG TERM GOALS: Target date: 09/10/2023  Pt will increase 30 sec STS score by > 4 reps in order to demonstrate improved functional safety and balance skills in ADL/mobility.   Baseline: 10x Goal status:  INITIAL  2.  Pt will improve 2 MWT to >300 with LRAD in order to demonstrate improved functional mobility capacity in community setting.  Baseline: 185 ft with RW Goal status: INITIAL  3.  Pt will improve ABC score by >15%  in order to demonstrate improved perceived balance with functional goals and outcomes. Baseline: See objective Goal status: INITIAL  4.  Pt will report > 50% improvement in subjective safety/balance concerns in order to demonstrate improved self awareness of balance deficits..  Baseline: See objective Goal status: INITIAL  ASSESSMENT:  CLINICAL IMPRESSION: Patient is a 66y.o. male who was seen today for physical therapy evaluation and treatment for I63.9 (ICD-10-CM) - Cerebrovascular accident (CVA), unspecified mechanism  (HCC).  Pt currently is living with son and grandchildren, with intermittent assistance for ADLs.  Independent with rolling walker for ambulation household.  Supervision ambulation with assistive device and community setting.  Prior to aortic aneurysm repair onset of CVA, patient was having decline in BLE muscle strength due to unknown back reasons, that have gone undiagnosed due to medical issues.  Prior to these events, patient was independent with all aspects of living, avid golfer.. Based upon today's evaluation, pt is demonstrating functional mobility, ambulation, ADL impairments due to new onset of CVA, BLE muscle weakness, balance limitations. Pt would benefit from skilled physical therapy services to address the above impairments/limitations and improve overall functional mobility and quality of life..   OBJECTIVE IMPAIRMENTS: Abnormal gait, decreased balance, decreased mobility, decreased ROM, decreased strength, and hypomobility.   ACTIVITY LIMITATIONS: carrying, lifting, bending, sitting, squatting, and locomotion level  PARTICIPATION LIMITATIONS: driving, community activity, and yard work  PERSONAL FACTORS: Age are also affecting patient's functional outcome.   REHAB POTENTIAL: Excellent  CLINICAL DECISION MAKING: Evolving/moderate complexity  EVALUATION COMPLEXITY: Moderate  PLAN:  PT FREQUENCY: 2x/week  PT DURATION: 8 weeks  PLANNED INTERVENTIONS: Therapeutic exercises, Therapeutic activity, Neuromuscular re-education, Balance training, Gait training, Patient/Family education, Self Care, Joint mobilization, Stair training, Orthotic/Fit training, DME instructions, Manual therapy, and Re-evaluation  PLAN FOR NEXT SESSION: Functional strengthening, DGI, ambulation without assistive device. Nelida Meuse PT, DPT Physical Therapist with [facility] [phone] office    Nelida Meuse, PT 07/16/2023, 4:21 PM

## 2023-07-16 NOTE — Patient Instructions (Signed)

## 2023-07-21 ENCOUNTER — Telehealth: Payer: Self-pay | Admitting: Cardiology

## 2023-07-21 ENCOUNTER — Encounter (HOSPITAL_COMMUNITY): Payer: Self-pay | Admitting: Occupational Therapy

## 2023-07-21 ENCOUNTER — Ambulatory Visit (HOSPITAL_COMMUNITY): Payer: Medicare Other | Attending: Physical Medicine & Rehabilitation

## 2023-07-21 ENCOUNTER — Ambulatory Visit (HOSPITAL_COMMUNITY): Payer: Medicare Other | Admitting: Occupational Therapy

## 2023-07-21 ENCOUNTER — Ambulatory Visit (HOSPITAL_COMMUNITY): Payer: Medicare Other | Admitting: Physical Therapy

## 2023-07-21 ENCOUNTER — Encounter (INDEPENDENT_AMBULATORY_CARE_PROVIDER_SITE_OTHER): Payer: Medicare Other | Admitting: *Deleted

## 2023-07-21 ENCOUNTER — Encounter (HOSPITAL_COMMUNITY): Payer: Self-pay

## 2023-07-21 DIAGNOSIS — R278 Other lack of coordination: Secondary | ICD-10-CM | POA: Insufficient documentation

## 2023-07-21 DIAGNOSIS — I480 Paroxysmal atrial fibrillation: Secondary | ICD-10-CM | POA: Insufficient documentation

## 2023-07-21 DIAGNOSIS — I639 Cerebral infarction, unspecified: Secondary | ICD-10-CM | POA: Insufficient documentation

## 2023-07-21 DIAGNOSIS — Z7409 Other reduced mobility: Secondary | ICD-10-CM | POA: Diagnosis present

## 2023-07-21 DIAGNOSIS — Z952 Presence of prosthetic heart valve: Secondary | ICD-10-CM | POA: Insufficient documentation

## 2023-07-21 DIAGNOSIS — Z5181 Encounter for therapeutic drug level monitoring: Secondary | ICD-10-CM | POA: Insufficient documentation

## 2023-07-21 DIAGNOSIS — M6281 Muscle weakness (generalized): Secondary | ICD-10-CM | POA: Insufficient documentation

## 2023-07-21 DIAGNOSIS — R29818 Other symptoms and signs involving the nervous system: Secondary | ICD-10-CM | POA: Insufficient documentation

## 2023-07-21 LAB — POCT INR: INR: 1.7 — AB (ref 2.0–3.0)

## 2023-07-21 MED ORDER — ATORVASTATIN CALCIUM 80 MG PO TABS: 80.0000 mg | ORAL_TABLET | Freq: Every day | ORAL | 3 refills | Status: DC

## 2023-07-21 MED ORDER — WARFARIN SODIUM 2.5 MG PO TABS: ORAL_TABLET | ORAL | 5 refills | Status: DC

## 2023-07-21 NOTE — Therapy (Signed)
OUTPATIENT PHYSICAL THERAPY NEURO TREATMENT   Patient Name: [name] MRN: [id number] DOB:08/07/1956, 67 y.o., male Today's Date: 07/21/2023   PCP: [facility] REFERRING PROVIDER: Erick Colace, MD  END OF SESSION:  PT End of Session - 07/21/23 1635     Visit Number 2    Number of Visits 16    Date for PT Re-Evaluation 09/10/23    Authorization Type Medicare part A & B    Authorization Time Period no visit limit; no auth    Authorization - Visit Number --    Progress Note Due on Visit 10    PT Start Time 1540    PT Stop Time 1618    PT Time Calculation (min) 38 min    Activity Tolerance Patient tolerated treatment well    Behavior During Therapy WFL for tasks assessed/performed              Past Medical History:  Diagnosis Date   Arthritis    Coronary atherosclerosis of native coronary artery    a. NSTEMI (02/2014):  LHC (02/2014):  Mild disease in LAD and CFX; prox RCA occluded with R-R collats, dist RCA filled by L-R collats, inf HK, EF 55%, LVEDP 15 mmHg.  PCI:  Unsuccessful angioplasty of RCA (late presentation of inf MI) - tx medically.   Essential hypertension    GERD (gastroesophageal reflux disease)    Hyperlipidemia    NSTEMI (non-ST elevated myocardial infarction) (HCC) 03/11/14   Renal cell carcinoma of right kidney (HCC)    Partial nephrectomy in 2013   Past Surgical History:  Procedure Laterality Date   AORTIC VALVE REPLACEMENT N/A 05/18/2023   Procedure: AORTIC VALVE REPLACEMENT (AVR) USING INSPIRIS RESILIA 23 MM AORTIC VALVE;  Surgeon: Alleen Borne, MD;  Location: [facility] OR;  Service: Open Heart Surgery;  Laterality: N/A;   BACK SURGERY     1989   CERVICAL DISC SURGERY  04/29/2021   COLONOSCOPY N/A 12/11/2020   Procedure: COLONOSCOPY;  Surgeon: Franky Macho, MD;  Location: [facility] SUITE;  Service: Gastroenterology;  Laterality: N/A;   LEFT HEART CATHETERIZATION WITH CORONARY ANGIOGRAM N/A 03/17/2014   Procedure: LEFT HEART  CATHETERIZATION WITH CORONARY ANGIOGRAM;  Surgeon: Corky Crafts, MD;  Location: [facility] CATH LAB;  Service: Cardiovascular;  Laterality: N/A;   POLYPECTOMY  12/11/2020   Procedure: POLYPECTOMY;  Surgeon: Franky Macho, MD;  Location: [facility];  Service: Gastroenterology;;   REPLACEMENT ASCENDING AORTA N/A 05/18/2023   Procedure: REPLACEMENT OF ASCENDING AORTIC ANEURYSM USING A 30 X 10 MM HEMASHIELD PLATINUM GRAFT;  Surgeon: Alleen Borne, MD;  Location: [facility] OR;  Service: Open Heart Surgery;  Laterality: N/A;  CIRC ARREST   RIGHT HEART CATH AND CORONARY ANGIOGRAPHY N/A 04/03/2023   Procedure: RIGHT HEART CATH AND CORONARY ANGIOGRAPHY;  Surgeon: Tonny Bollman, MD;  Location: [facility] INVASIVE CV LAB;  Service: Cardiovascular;  Laterality: N/A;   ROBOT ASSISTED LAPAROSCOPIC NEPHRECTOMY  01/14/2012   Procedure: ROBOTIC ASSISTED LAPAROSCOPIC NEPHRECTOMY;  Surgeon: Milford Cage, MD;  Location: [facility] ORS;  Service: Urology;  Laterality: Right;  Robot Laparoscopic Right Partial Nephrectomy     TEE WITHOUT CARDIOVERSION N/A 05/18/2023   Procedure: TRANSESOPHAGEAL ECHOCARDIOGRAM;  Surgeon: Alleen Borne, MD;  Location: [facility] OR;  Service: Open Heart Surgery;  Laterality: N/A;   TONSILLECTOMY     Patient Active Problem List   Diagnosis Date Noted   Atrial fibrillation (HCC) 06/08/2023   Encounter for therapeutic drug monitoring 06/08/2023   CVA (  cerebral vascular accident) (HCC) 05/25/2023   S/P AVR (aortic valve replacement) 05/18/2023   Chronic cough 02/20/2023   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 [facility]) 02/15/2021   Right leg weakness 02/07/2021   Allodynia 02/07/2021   Gait disturbance 02/07/2021   White matter abnormality on MRI of brain 02/07/2021   Hyperreflexia 02/07/2021   Special screening for malignant neoplasms, colon    Adenomatous polyp of transverse colon    Diverticulosis of large intestine without diverticulitis    PSVT (paroxysmal  supraventricular tachycardia) 05/29/2014   Precordial pain 04/19/2014   Coronary atherosclerosis of native coronary artery 04/06/2014   Essential hypertension, benign 04/06/2014   HLD (hyperlipidemia) 04/06/2014    ONSET DATE: 05/18/23  REFERRING DIAG: I63.9 (ICD-10-CM) - Cerebrovascular accident (CVA), unspecified mechanism (HCC)   THERAPY DIAG:  Cerebrovascular accident (CVA), unspecified mechanism (HCC)  Impaired functional mobility, balance, gait, and endurance  Muscle weakness (generalized)  Other symptoms and signs involving the nervous system  Rationale for Evaluation and Treatment: Rehabilitation  SUBJECTIVE:                                                                                                                                                                                             SUBJECTIVE STATEMENT: 07/21/23:  Reports he is feeling good today.  No reports of new news or pain.  Pt arrived wearing AFO for Rt foot.  Eval:  Pt went into open heart surgery on 7/1 and had small stroke after surgery on primary RUE. Pt has had leg tests over the past few years regarding nerve conduction and circulation issues. Extensive discussion regarding multiple medical issues surrounding BLE weakness hsitory prior to stroke. Was avid golfer. "Along as I got some hope, I with you all the way."  Pt accompanied by: self  PERTINENT HISTORY:  67 year old male who [facility] for a ascending aortic aneurysm repair on 05/18/2023.  Patient with blood loss given treatment with iron replacement.  Later on 7 of 3/24 patient noted with right hand weakness.  Rapid response was called.  Patient was noted to be in atrial fibrillation with RVR and was hypotensive.  MRI showed scattered small acute infarction left cerebral hemisphere in both cerebral hemispheres.  C3 spinal surgery Open heart surgery on 05/18/23.  LBP and BLE weakness Dec'22 to current.   PAIN:  Are you having  pain?  Pt reports chronic pain but nothing new.   PRECAUTIONS: None  RED FLAGS: None   WEIGHT BEARING RESTRICTIONS: No  FALLS: Has patient fallen in last 6 months? No  LIVING ENVIRONMENT: Lives  with: lives with their family Lives in: House/apartment Stairs:  2 steps in/out, no concerns.  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 Pt had Dme avilable prior to stroke but was not using those  dependently, only when back pain was bad.   PATIENT GOALS: "golf might be a lofty goal"  OBJECTIVE:   DIAGNOSTIC FINDINGS:   IMPRESSION: Scattered small acute infarcts in the left cerebellar hemisphere and both cerebral hemispheres, likely embolic in etiology.  COGNITION: Overall cognitive status: Within functional limits for tasks assessed   SENSATION: WFL  COORDINATION: WFL  POSTURE: No Significant postural limitations  LOWER EXTREMITY ROM:     Active  Right Eval Left Eval  Hip flexion    Hip extension    Hip abduction    Hip adduction    Hip internal rotation    Hip external rotation    Knee flexion    Knee extension    Ankle dorsiflexion    Ankle plantarflexion    Ankle inversion    Ankle eversion     (Blank rows = not tested)  LOWER EXTREMITY MMT:    MMT Right Eval Left Eval Right 07/21/23 Left  07/21/23  Hip flexion   4/5 5/5  Hip extension   3+ Sidelying for sternal precaution 4-/5  Hip abduction   3+ 4/5  Hip adduction      Hip internal rotation      Hip external rotation      Knee flexion      Knee extension 3+ 3+    Ankle dorsiflexion 2+     Ankle plantarflexion      Ankle inversion      Ankle eversion      (Blank rows = not tested)     GAIT: Gait pattern: decreased step length- Right, decreased ankle dorsiflexion- Right, and Right foot flat Distance walked: 185 feet Assistive device  utilized: Walker - 2 wheeled Level of assistance: SBA Comments: Patient ambulating with steady gait pattern without cueing, ambulating with right flatfoot and sliding through RLE swing.  FUNCTIONAL TESTS:  30 seconds chair stand test:10x 2 minute walk test: 147ft  PATIENT SURVEYS:  ABC scale 45% impairment.   TODAY'S TREATMENT:                                                                                                                              DATE:  07/21/23:  Reviewed goals Educated importance of HEP Compliance DGI with no AD, SBA with gait belt for safety MMT for hips Supine: Bridge Sidelying: Abduction Seated: STS eccentric control  - Toe raises  DGI complete with no AD, SBA for safety 1. Gait level surface (1) Moderate Impairment: Walks 20', slow speed, abnormal gait pattern, evidence for imbalance. 2. Change in gait speed (1) Moderate Impairment: Makes  only minor adjustments to walking speed, or accomplishes a change in speed with significant gait deviations, or changes speed but has significant gait deviations, or changes speed but loses balance but is able to recover and continue walking. 3. Gait with horizontal head turns (1) Moderate Impairment: Performs head turns with moderate change in gait velocity, slows down, staggers but recovers, can continue to walk. 4. Gait with vertical head turns (2) Mild Impairment: Performs head turns smoothly with slight change in gait velocity, i.e., minor disruption to smooth gait path or uses walking aid. 5. Gait and pivot turn (1) Moderate Impairment: Turns slowly, requires verbal cueing, requires several small steps to catch balance following turn and stop. 6. Step over obstacle (1) Moderate Impairment: Is able to step over box but must stop, then step over. May require verbal cueing. 7. Step around obstacles (2) Mild Impairment: Is able to step around both cones, but must slow down and adjust steps to clear cones. 8.  Stairs (1) Moderate Impairment: Two feet to a stair, must use rail.  TOTAL SCORE: 10 / 24    PT evaluation, findings, prognosis, frequency, and palsy.   PATIENT EDUCATION: Education details: Discussion regarding PT findings from evaluation, prognosis, restrictions for heart rate previous aortic aneurysm repair. Person educated: Patient Education method: Explanation Education comprehension: verbalized understanding  HOME EXERCISE PROGRAM: Access Code: [email or web address] URL: [email or web address] Date: 07/21/2023 Prepared by: Becky Sax  Exercises - Supine Bridge  - 2 x daily - 7 x weekly - 1 sets - 10 reps - 5" hold - Sidelying Hip Abduction  - 1 x daily - 7 x weekly - 3 sets - 10 reps - 3" hold - Sit to Stand Without Arm Support  - 3 x daily - 7 x weekly - 1 sets - 10 reps - Seated Toe Raise  - 3 x daily - 7 x weekly - 1 sets - 10 reps - 5" hold  GOALS: Goals reviewed with patient? No  SHORT TERM GOALS: Target date: 08/13/2023  Pt and caregiver will be independent with HEP in order to demonstrate participation in Physical Therapy POC.  Baseline: Goal status: IN PROGRESS  2.  Pt will report > 25% improvement in subjective safety/balance concerns in order to demonstrate improved self awareness of balance deficits.  Baseline:  Goal status: IN PROGRESS  LONG TERM GOALS: Target date: 09/10/2023  Pt will increase 30 sec STS score by > 4 reps in order to demonstrate improved functional safety and balance skills in ADL/mobility.   Baseline: 10x Goal status: IN PROGRESS  2.  Pt will improve 2 MWT to >300 with LRAD in order to demonstrate improved functional mobility capacity in community setting.  Baseline: 185 ft with RW Goal status: IN PROGRESS  3.  Pt will improve ABC score by >15%  in order to demonstrate improved perceived balance with functional goals and outcomes. Baseline: See objective Goal status: IN PROGRESS  4.  Pt will report > 50% improvement in  subjective safety/balance concerns in order to demonstrate improved self awareness of balance deficits..  Baseline: See objective Goal status: IN PROGRESS  ASSESSMENT:  CLINICAL IMPRESSION: 07/21/23:  Reviewed goals and educated importance of HEP compliance with HEP.  DGI complete with score 10/24, encouraged to continue with AD and AFO Rt foot for safety, pt main problem with speed control and Rt LE weakness noted with stairs step to pattern.  Further testing complete to address hip instability with noted gluteal weakness.  Exercises complete for strengthening  and added to HEP, pt able to demonstrate and verbalize appropriate mechanics.    Eval:  Patient is a 67y.o. male who was seen today for physical therapy evaluation and treatment for I63.9 (ICD-10-CM) - Cerebrovascular accident (CVA), unspecified mechanism (HCC).  Pt currently is living with son and grandchildren, with intermittent assistance for ADLs.  Independent with rolling walker for ambulation household.  Supervision ambulation with assistive device and community setting.  Prior to aortic aneurysm repair onset of CVA, patient was having decline in BLE muscle strength due to unknown back reasons, that have gone undiagnosed due to medical issues.  Prior to these events, patient was independent with all aspects of living, avid golfer.. Based upon today's evaluation, pt is demonstrating functional mobility, ambulation, ADL impairments due to new onset of CVA, BLE muscle weakness, balance limitations. Pt would benefit from skilled physical therapy services to address the above impairments/limitations and improve overall functional mobility and quality of life..   OBJECTIVE IMPAIRMENTS: Abnormal gait, decreased balance, decreased mobility, decreased ROM, decreased strength, and hypomobility.   ACTIVITY LIMITATIONS: carrying, lifting, bending, sitting, squatting, and locomotion level  PARTICIPATION LIMITATIONS: driving, community activity, and  yard work  PERSONAL FACTORS: Age are also affecting patient's functional outcome.   REHAB POTENTIAL: Excellent  CLINICAL DECISION MAKING: Evolving/moderate complexity  EVALUATION COMPLEXITY: Moderate  PLAN:  PT FREQUENCY: 2x/week  PT DURATION: 8 weeks  PLANNED INTERVENTIONS: Therapeutic exercises, Therapeutic activity, Neuromuscular re-education, Balance training, Gait training, Patient/Family education, Self Care, Joint mobilization, Stair training, Orthotic/Fit training, DME instructions, Manual therapy, and Re-evaluation  PLAN FOR NEXT SESSION: Functional strengthening, DGI, ambulation without assistive device.   Becky Sax, LPTA/CLT; CBIS [phone]  Juel Burrow, PTA 07/21/2023, 4:37 PM

## 2023-07-21 NOTE — Therapy (Signed)
OUTPATIENT OCCUPATIONAL THERAPY NEURO TREATMENT  Patient Name: [name] MRN: [id number] DOB:[birth year], 67 y.o., male Today's Date: 07/21/2023    END OF SESSION:  OT End of Session - 07/21/23 1515     Visit Number 2    Number of Visits 8    Date for OT Re-Evaluation 08/15/23    Authorization Type 1) Medicare A & B  2) [employer]    Progress Note Due on Visit 10    OT Start Time 1433    OT Stop Time 1512    OT Time Calculation (min) 39 min    Activity Tolerance Patient tolerated treatment well    Behavior During Therapy WFL for tasks assessed/performed              Past Medical History:  Diagnosis Date   Arthritis    Coronary atherosclerosis of native coronary artery    a. NSTEMI (02/2014):  LHC (02/2014):  Mild disease in LAD and CFX; prox RCA occluded with R-R collats, dist RCA filled by L-R collats, inf HK, EF 55%, LVEDP 15 mmHg.  PCI:  Unsuccessful angioplasty of RCA (late presentation of inf MI) - tx medically.   Essential hypertension    GERD (gastroesophageal reflux disease)    Hyperlipidemia    NSTEMI (non-ST elevated myocardial infarction) (HCC) 03/11/14   Renal cell carcinoma of right kidney (HCC)    Partial nephrectomy in 2013   Past Surgical History:  Procedure Laterality Date   AORTIC VALVE REPLACEMENT N/A 05/18/2023   Procedure: AORTIC VALVE REPLACEMENT (AVR) USING INSPIRIS RESILIA 23 MM AORTIC VALVE;  Surgeon: Alleen Borne, MD;  Location: [facility] OR;  Service: Open Heart Surgery;  Laterality: N/A;   BACK SURGERY     1989   CERVICAL DISC SURGERY  04/29/2021   COLONOSCOPY N/A 12/11/2020   Procedure: COLONOSCOPY;  Surgeon: Franky Macho, MD;  Location: [facility];  Service: Gastroenterology;  Laterality: N/A;   LEFT HEART CATHETERIZATION WITH CORONARY ANGIOGRAM N/A 03/17/2014   Procedure: LEFT HEART CATHETERIZATION WITH CORONARY ANGIOGRAM;  Surgeon: Corky Crafts, MD;  Location: [facility] CATH LAB;  Service: Cardiovascular;  Laterality: N/A;   POLYPECTOMY   12/11/2020   Procedure: POLYPECTOMY;  Surgeon: Franky Macho, MD;  Location: [facility];  Service: Gastroenterology;;   REPLACEMENT ASCENDING AORTA N/A 05/18/2023   Procedure: REPLACEMENT OF ASCENDING AORTIC ANEURYSM USING A 30 X 10 MM HEMASHIELD PLATINUM GRAFT;  Surgeon: Alleen Borne, MD;  Location: [facility] OR;  Service: Open Heart Surgery;  Laterality: N/A;  CIRC ARREST   RIGHT HEART CATH AND CORONARY ANGIOGRAPHY N/A 04/03/2023   Procedure: RIGHT HEART CATH AND CORONARY ANGIOGRAPHY;  Surgeon: Tonny Bollman, MD;  Location: [facility] INVASIVE CV LAB;  Service: Cardiovascular;  Laterality: N/A;   ROBOT ASSISTED LAPAROSCOPIC NEPHRECTOMY  01/14/2012   Procedure: ROBOTIC ASSISTED LAPAROSCOPIC NEPHRECTOMY;  Surgeon: Milford Cage, MD;  Location: [facility] ORS;  Service: Urology;  Laterality: Right;  Robot Laparoscopic Right Partial Nephrectomy     TEE WITHOUT CARDIOVERSION N/A 05/18/2023   Procedure: TRANSESOPHAGEAL ECHOCARDIOGRAM;  Surgeon: Alleen Borne, MD;  Location: [facility] OR;  Service: Open Heart Surgery;  Laterality: N/A;   TONSILLECTOMY     Patient Active Problem List   Diagnosis Date Noted   Atrial fibrillation (HCC) 06/08/2023   Encounter for therapeutic drug monitoring 06/08/2023   CVA (cerebral vascular accident) (HCC) 05/25/2023   S/P AVR (aortic valve replacement) 05/18/2023   Chronic cough 02/20/2023   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 [facility]) 02/15/2021   Right leg weakness 02/07/2021   Allodynia 02/07/2021   Gait disturbance 02/07/2021   White matter abnormality on MRI of brain 02/07/2021   Hyperreflexia 02/07/2021   Special screening for malignant neoplasms, colon    Adenomatous polyp of transverse colon    Diverticulosis of large intestine without diverticulitis    PSVT (paroxysmal supraventricular tachycardia) 05/29/2014   Precordial pain 04/19/2014   Coronary atherosclerosis of native coronary artery 04/06/2014   Essential hypertension,  benign 04/06/2014   HLD (hyperlipidemia) 04/06/2014   PCP: [name], PA-C REFERRING PROVIDER: Dr. Claudette Laws  ONSET DATE: 05/20/23  REFERRING DIAG: I63.9 (ICD-10-CM) - Cerebrovascular accident (CVA), unspecified mechanism (HCC)   THERAPY DIAG:  Other symptoms and signs involving the nervous system  Other lack of coordination  Rationale for Evaluation and Treatment: Rehabilitation  SUBJECTIVE:   SUBJECTIVE STATEMENT: S: "The exercises are hard sometimes."   PERTINENT HISTORY: Pt is a 67 year old male who presented to the [facility] for AVR and ascending aortic aneurysm repair on 05/18/2023. He had evidence of bicuspid aortic valve stenosis with 5.1 cm ascending aortic aneurysm and underwent repair by Dr. Laneta Simmers on 05/18/2023. He was extubated later that day. He had brief episode of hematuria after difficult Foley placement. Remains on Proscar for urinary retention. Intraoperative TEE with EF approximately 50%. Pacer wires and chest tubes removed on 7/03. Chest x-ray was stable and he was transferred out to stepdown unit. Acute blood loss anemia treated with oral iron replacement. Aspirin dosing decreased to 81 mg due to post-operative thrombocytopenia. Later in the evening on 7/03 he was noted to have right hand weakness and rapid response was called. He was noted to have atrial fibrillation with RVR and was hypotensive. He was started on an amiodarone infusion and cardiology consulted. He was transition to amiodarone 200 mg twice daily on 7/04. CT of head without evidence of acute infarct or hemorrhage, however brain MRI showed scattered small acute infarcts in the left cerebellar hemisphere in both cerebral hemispheres likely embolic in etiology.   PRECAUTIONS: Sternal and Fall  WEIGHT BEARING RESTRICTIONS: No  PAIN:  Are you having pain? No  FALLS: Has patient fallen in last 6 months? No  LIVING ENVIRONMENT: Lives with: lives with their family Lives in: House/apartment Stairs:  Yes: External: 2 steps; bilateral but cannot reach both Has following equipment at home: Single point cane and Walker - 2 wheeled  PLOF: Independent  PATIENT GOALS: To get stronger.   OBJECTIVE:   HAND DOMINANCE: Right  ADLs: Overall ADLs: Pt reports he requires increased time for ADLs. Is not driving yet. Pt reports meal preparation is difficult due to being fatigued. Picking up items is difficult, eating with right hand is difficult. Has difficulty with manipulating items.   ACTIVITY TOLERANCE: Activity tolerance: Limited due to recent heart sx  FUNCTIONAL OUTCOME MEASURES: FOTO: 63/100  UPPER EXTREMITY ROM:    BUE A/ROM is [facility]  UPPER EXTREMITY MMT:     MMT Right eval  Shoulder flexion 5/5  Shoulder abduction 5/5  Shoulder internal rotation 5/5  Shoulder external rotation 4+/5  Elbow flexion 5/5  Elbow extension 5/5  Wrist flexion 4/5  Wrist extension 5/5  Wrist ulnar deviation 4/5  Wrist radial deviation 4/5  Wrist pronation 5/5  Wrist supination 5/5  (Blank rows = not tested)  HAND FUNCTION: Grip strength: Right: 30 lbs; Left: 50 lbs, Lateral pinch: Right: 23 lbs, Left: 9 lbs, and 3 point pinch:  Right: 12 lbs, Left: 3 lbs  COORDINATION: 9 Hole Peg test: Right: 1' 04 sec; Left: 31.42 sec  SENSATION: Tingling in hands/feet at baseline  MUSCLE TONE: RUE: Mild    TODAY'S TREATMENT:                                                                                                                              DATE:  07/21/23 -Sponges: 6, 9, 8 -Finger taps: 5 rounds -Thumb lifts: 10 reps -Weightbearing: pt in standing, holding full weighbearing on RUE while reaching to the left for pegs and then reaching across to place in grooved pegboard with LUE -Wrist A/ROM: flexion, extension, radial/ulnar deviation, forearm supination/pronation, 10 reps -Grip strengthening: hand gripper at 25#, large and medium beads with gripper vertical -Pinch strengthening: pt using  lateral pinch and red clothespin to grasp and stack 3 towers of 3 sponges; 3 point pinch and yellow clothespin to replace in bucket -Pinch task: pt using tip pinch to remove grooved pegs from pegboard and place in holder. Elbow propped on towel to allow for wrist extension practice -Opposition to each digit, circle to full extension: 10 reps   PATIENT EDUCATION: Education details: reviewed HEP Person educated: Patient Education method: Explanation, Demonstration, and Handouts Education comprehension: verbalized understanding and returned demonstration  HOME EXERCISE PROGRAM: Eval: finger A/ROM   GOALS: Goals reviewed with patient? Yes  SHORT TERM GOALS: Target date: 08/15/23  Pt will be provided with and educated on HEP to improve mobility and functional use of right hand during ADLs.   Goal status: IN PROGRESS  2.  Pt will increase right grip strength by 20# and pinch strength by 4# or greater to improve ability to grasp and hold items for hygiene and meal preparation tasks.   Goal status: IN PROGRESS  3.  Pt will increase right fine motor coordination required for tying shoes or manipulating small objects by completing 9 hole peg test in 45" or less.   Goal status: IN PROGRESS  4.  Pt will be educated on AE available to improve success and independence in ADL completion.   Goal status: IN PROGRESS    ASSESSMENT:  CLINICAL IMPRESSION: Pt reports he has been doing his HEP. Initiated weightbearing, A/ROM, grip strengthening, and coordination tasks. Pt with mod to max difficulty with manipulation of sponges in hand. Pt with improved speed of motor planning after weightbearing task. Mod fatigue with hand gripper activity, rest breaks provided as needed.   PERFORMANCE DEFICITS: in functional skills including ADLs, IADLs, coordination, proprioception, sensation, tone, strength, Fine motor control, and UE functional use    PLAN:  OT FREQUENCY: 2x/week  OT DURATION: 4  weeks  PLANNED INTERVENTIONS: therapeutic exercise, therapeutic activity, neuromuscular re-education, splinting, electrical stimulation, ultrasound, patient/family education, and DME and/or AE instructions, self-care  RECOMMENDED OTHER SERVICES: PT  CONSULTED AND AGREED WITH PLAN OF CARE: Patient  PLAN FOR NEXT SESSION: Follow up on HEP, begin grip and pinch  strengthening, coordination tasks   Ezra Sites, OTR/L  [phone] 07/21/2023, 3:16 PM

## 2023-07-21 NOTE — Telephone Encounter (Signed)
Medication refilled

## 2023-07-21 NOTE — Patient Instructions (Signed)
Increase warfarin to 1 tablet daily except 1 1/2 tablets on Tuesdays, Thursdays and Saturdays Recheck in 2 weeks

## 2023-07-21 NOTE — Telephone Encounter (Signed)
Please change pt's pharmacy to [facility]    *STAT* If patient is at the pharmacy, call can be transferred to refill team.   1. Which medications need to be refilled? (please list name of each medication and dose if known)     atorvastatin (LIPITOR) 80 MG tablet     2. Would you like to learn more about the convenience, safety, & potential cost savings by using the [facility]?    3. Are you open to using the [facility] (Type [facility].    4. Which pharmacy/location (including street and city if local pharmacy) is medication to be sent to?   [facility]    5. Do they need a 30 day or 90 day supply?   90

## 2023-07-23 ENCOUNTER — Ambulatory Visit (HOSPITAL_COMMUNITY): Payer: Medicare Other

## 2023-07-23 ENCOUNTER — Encounter (HOSPITAL_COMMUNITY): Payer: Self-pay | Admitting: Occupational Therapy

## 2023-07-23 ENCOUNTER — Ambulatory Visit (HOSPITAL_COMMUNITY): Payer: Medicare Other | Admitting: Occupational Therapy

## 2023-07-23 DIAGNOSIS — R278 Other lack of coordination: Secondary | ICD-10-CM

## 2023-07-23 DIAGNOSIS — R29818 Other symptoms and signs involving the nervous system: Secondary | ICD-10-CM

## 2023-07-23 DIAGNOSIS — I639 Cerebral infarction, unspecified: Secondary | ICD-10-CM | POA: Diagnosis not present

## 2023-07-23 DIAGNOSIS — M6281 Muscle weakness (generalized): Secondary | ICD-10-CM

## 2023-07-23 NOTE — Therapy (Signed)
OUTPATIENT OCCUPATIONAL THERAPY NEURO TREATMENT  Patient Name: [name] MRN: [id number] DOB:06-03-1956, 67 y.o., male Today's Date: 07/24/2023    END OF SESSION:  OT End of Session - 07/23/23 1605     Visit Number 3    Number of Visits 8    Date for OT Re-Evaluation 08/15/23    Authorization Type 1) Medicare A & B  2) [employer]    Progress Note Due on Visit 10    OT Start Time 1515    OT Stop Time 1605    OT Time Calculation (min) 50 min    Activity Tolerance Patient tolerated treatment well    Behavior During Therapy WFL for tasks assessed/performed             Past Medical History:  Diagnosis Date   Arthritis    Coronary atherosclerosis of native coronary artery    a. NSTEMI (02/2014):  LHC (02/2014):  Mild disease in LAD and CFX; prox RCA occluded with R-R collats, dist RCA filled by L-R collats, inf HK, EF 55%, LVEDP 15 mmHg.  PCI:  Unsuccessful angioplasty of RCA (late presentation of inf MI) - tx medically.   Essential hypertension    GERD (gastroesophageal reflux disease)    Hyperlipidemia    NSTEMI (non-ST elevated myocardial infarction) (HCC) 03/11/14   Renal cell carcinoma of right kidney (HCC)    Partial nephrectomy in 2013   Past Surgical History:  Procedure Laterality Date   AORTIC VALVE REPLACEMENT N/A 05/18/2023   Procedure: AORTIC VALVE REPLACEMENT (AVR) USING INSPIRIS RESILIA 23 MM AORTIC VALVE;  Surgeon: Alleen Borne, MD;  Location: [facility] OR;  Service: Open Heart Surgery;  Laterality: N/A;   BACK SURGERY     1989   CERVICAL DISC SURGERY  04/29/2021   COLONOSCOPY N/A 12/11/2020   Procedure: COLONOSCOPY;  Surgeon: Franky Macho, MD;  [facility];  Service: Gastroenterology;  Laterality: N/A;   LEFT HEART CATHETERIZATION WITH CORONARY ANGIOGRAM N/A 03/17/2014   Procedure: LEFT HEART CATHETERIZATION WITH CORONARY ANGIOGRAM;  Surgeon: Corky Crafts, MD;  Location: [facility] CATH LAB;  Service: Cardiovascular;  Laterality: N/A;   POLYPECTOMY   12/11/2020   Procedure: POLYPECTOMY;  Surgeon: Franky Macho, MD;  [facility];  Service: Gastroenterology;;   REPLACEMENT ASCENDING AORTA N/A 05/18/2023   Procedure: REPLACEMENT OF ASCENDING AORTIC ANEURYSM USING A 30 X 10 MM HEMASHIELD PLATINUM GRAFT;  Surgeon: Alleen Borne, MD;  Location: [facility] OR;  Service: Open Heart Surgery;  Laterality: N/A;  CIRC ARREST   RIGHT HEART CATH AND CORONARY ANGIOGRAPHY N/A 04/03/2023   Procedure: RIGHT HEART CATH AND CORONARY ANGIOGRAPHY;  Surgeon: Tonny Bollman, MD;  Location: [facility] INVASIVE CV LAB;  Service: Cardiovascular;  Laterality: N/A;   ROBOT ASSISTED LAPAROSCOPIC NEPHRECTOMY  01/14/2012   Procedure: ROBOTIC ASSISTED LAPAROSCOPIC NEPHRECTOMY;  Surgeon: Milford Cage, MD;  Location: [facility] ORS;  Service: Urology;  Laterality: Right;  Robot Laparoscopic Right Partial Nephrectomy     TEE WITHOUT CARDIOVERSION N/A 05/18/2023   Procedure: TRANSESOPHAGEAL ECHOCARDIOGRAM;  Surgeon: Alleen Borne, MD;  Location: [facility] OR;  Service: Open Heart Surgery;  Laterality: N/A;   TONSILLECTOMY     Patient Active Problem List   Diagnosis Date Noted   Atrial fibrillation (HCC) 06/08/2023   Encounter for therapeutic drug monitoring 06/08/2023   CVA (cerebral vascular accident) (HCC) 05/25/2023   S/P AVR (aortic valve replacement) 05/18/2023   Chronic cough 02/20/2023   Myelopathy concurrent with and due to spinal stenosis  of cervical region [facility]) 02/15/2021   Right leg weakness 02/07/2021   Allodynia 02/07/2021   Gait disturbance 02/07/2021   White matter abnormality on MRI of brain 02/07/2021   Hyperreflexia 02/07/2021   Special screening for malignant neoplasms, colon    Adenomatous polyp of transverse colon    Diverticulosis of large intestine without diverticulitis    PSVT (paroxysmal supraventricular tachycardia) 05/29/2014   Precordial pain 04/19/2014   Coronary atherosclerosis of native coronary artery 04/06/2014   Essential hypertension,  benign 04/06/2014   HLD (hyperlipidemia) 04/06/2014   PCP: [name], PA-C REFERRING PROVIDER: Dr. Claudette Laws  ONSET DATE: 05/20/23  REFERRING DIAG: I63.9 (ICD-10-CM) - Cerebrovascular accident (CVA), unspecified mechanism (HCC)   THERAPY DIAG:  Other lack of coordination  Other symptoms and signs involving the nervous system  Rationale for Evaluation and Treatment: Rehabilitation  SUBJECTIVE:   SUBJECTIVE STATEMENT: S: "The exercises are hard sometimes."   PERTINENT HISTORY: Pt is a 67 year old male who presented to the [facility] for AVR and ascending aortic aneurysm repair on 05/18/2023. He had evidence of bicuspid aortic valve stenosis with 5.1 cm ascending aortic aneurysm and underwent repair by Dr. Laneta Simmers on 05/18/2023. He was extubated later that day. He had brief episode of hematuria after difficult Foley placement. Remains on Proscar for urinary retention. Intraoperative TEE with EF approximately 50%. Pacer wires and chest tubes removed on 7/03. Chest x-ray was stable and he was transferred out to stepdown unit. Acute blood loss anemia treated with oral iron replacement. Aspirin dosing decreased to 81 mg due to post-operative thrombocytopenia. Later in the evening on 7/03 he was noted to have right hand weakness and rapid response was called. He was noted to have atrial fibrillation with RVR and was hypotensive. He was started on an amiodarone infusion and cardiology consulted. He was transition to amiodarone 200 mg twice daily on 7/04. CT of head without evidence of acute infarct or hemorrhage, however brain MRI showed scattered small acute infarcts in the left cerebellar hemisphere in both cerebral hemispheres likely embolic in etiology.   PRECAUTIONS: Sternal and Fall  WEIGHT BEARING RESTRICTIONS: No  PAIN:  Are you having pain? No  FALLS: Has patient fallen in last 6 months? No  LIVING ENVIRONMENT: Lives with: lives with their family Lives in: House/apartment Stairs:  Yes: External: 2 steps; bilateral but cannot reach both Has following equipment at home: Single point cane and Walker - 2 wheeled  PLOF: Independent  PATIENT GOALS: To get stronger.   OBJECTIVE:   HAND DOMINANCE: Right  ADLs: Overall ADLs: Pt reports he requires increased time for ADLs. Is not driving yet. Pt reports meal preparation is difficult due to being fatigued. Picking up items is difficult, eating with right hand is difficult. Has difficulty with manipulating items.   ACTIVITY TOLERANCE: Activity tolerance: Limited due to recent heart sx  FUNCTIONAL OUTCOME MEASURES: FOTO: 63/100  UPPER EXTREMITY ROM:    BUE A/ROM is [facility]  UPPER EXTREMITY MMT:     MMT Right eval  Shoulder flexion 5/5  Shoulder abduction 5/5  Shoulder internal rotation 5/5  Shoulder external rotation 4+/5  Elbow flexion 5/5  Elbow extension 5/5  Wrist flexion 4/5  Wrist extension 5/5  Wrist ulnar deviation 4/5  Wrist radial deviation 4/5  Wrist pronation 5/5  Wrist supination 5/5  (Blank rows = not tested)  HAND FUNCTION: Grip strength: Right: 30 lbs; Left: 50 lbs, Lateral pinch: Right: 23 lbs, Left: 9 lbs, and 3 point pinch: Right:  12 lbs, Left: 3 lbs  COORDINATION: 9 Hole Peg test: Right: 1' 04 sec; Left: 31.42 sec  SENSATION: Tingling in hands/feet at baseline  MUSCLE TONE: RUE: Mild    TODAY'S TREATMENT:                                                                                                                              DATE:   07/23/23 -Weightbearing: in standing, on flat hands, weight shifting x60", reaching forward with LUE x15 -Shoulder A/ROM: flexion, abduction, protraction, horizontal abduction, er/IR, x10 -Digit ROM: composite flexion, abduction/adduction, opposition, fingers taps, thumb abduction -Spinning cone with finger tips: x3 full spins -Pinch Strengthening: red, green, blue, black, resistance clips, tripod pinch up, lateral pinch down, x6 clips -stacking 6  cubes: first with fingers, then with green resistance clip -Finger Rubber Bands: yellow, thumb lifts, thumb presses, finger extensions, x10 each  07/21/23 -Sponges: 6, 9, 8 -Finger taps: 5 rounds -Thumb lifts: 10 reps -Weightbearing: pt in standing, holding full weighbearing on RUE while reaching to the left for pegs and then reaching across to place in grooved pegboard with LUE -Wrist A/ROM: flexion, extension, radial/ulnar deviation, forearm supination/pronation, 10 reps -Grip strengthening: hand gripper at 25#, large and medium beads with gripper vertical -Pinch strengthening: pt using lateral pinch and red clothespin to grasp and stack 3 towers of 3 sponges; 3 point pinch and yellow clothespin to replace in bucket -Pinch task: pt using tip pinch to remove grooved pegs from pegboard and place in holder. Elbow propped on towel to allow for wrist extension practice -Opposition to each digit, circle to full extension: 10 reps   PATIENT EDUCATION: Education details: Dispensing optician band exercises Person educated: Patient Education method: Programmer, multimedia, Facilities manager, and Handouts Education comprehension: verbalized understanding and returned demonstration  HOME EXERCISE PROGRAM: Eval: finger A/ROM 9/5: Finger Rubber Band Exercises   GOALS: Goals reviewed with patient? Yes  SHORT TERM GOALS: Target date: 08/15/23  Pt will be provided with and educated on HEP to improve mobility and functional use of right hand during ADLs.   Goal status: IN PROGRESS  2.  Pt will increase right grip strength by 20# and pinch strength by 4# or greater to improve ability to grasp and hold items for hygiene and meal preparation tasks.   Goal status: IN PROGRESS  3.  Pt will increase right fine motor coordination required for tying shoes or manipulating small objects by completing 9 hole peg test in 45" or less.   Goal status: IN PROGRESS  4.  Pt will be educated on AE available to improve success and  independence in ADL completion.   Goal status: IN PROGRESS    ASSESSMENT:  CLINICAL IMPRESSION: This session, pt continuing to work on overall mobility, along with heavy focus on finger and thumb ROM and strength. Pt reporting his biggest concern is his thumb, as it is not moving much at all. OT and pt worked on [employer]"  having pt mobilize his thumb side to side and up and down in random order. Verbal and tactile cuing provided for positioning and technique throughout session.   PERFORMANCE DEFICITS: in functional skills including ADLs, IADLs, coordination, proprioception, sensation, tone, strength, Fine motor control, and UE functional use    PLAN:  OT FREQUENCY: 2x/week  OT DURATION: 4 weeks  PLANNED INTERVENTIONS: therapeutic exercise, therapeutic activity, neuromuscular re-education, splinting, electrical stimulation, ultrasound, patient/family education, and DME and/or AE instructions, self-care  RECOMMENDED OTHER SERVICES: PT  CONSULTED AND AGREED WITH PLAN OF CARE: Patient  PLAN FOR NEXT SESSION: Follow up on HEP, begin grip and pinch strengthening, coordination tasks   Trish Mage, OTR/L [phone] 07/24/2023, 11:55 AM

## 2023-07-23 NOTE — Therapy (Addendum)
OUTPATIENT PHYSICAL THERAPY NEURO TREATMENT   Patient Name: [name] MRN: [id number] DOB:1956/11/09, 67 y.o., male Today's Date: 07/23/2023   PCP: [facility] REFERRING PROVIDER: Erick Colace, MD  END OF SESSION:  PT End of Session - 07/23/23 1441     Visit Number 3    Number of Visits 16    Date for PT Re-Evaluation 09/10/23    Authorization Type Medicare part A & B    Authorization Time Period no visit limit; no auth    Progress Note Due on Visit 10    PT Start Time 1435    PT Stop Time 1515    PT Time Calculation (min) 40 min    Activity Tolerance Patient tolerated treatment well    Behavior During Therapy WFL for tasks assessed/performed             Past Medical History:  Diagnosis Date   Arthritis    Coronary atherosclerosis of native coronary artery    a. NSTEMI (02/2014):  LHC (02/2014):  Mild disease in LAD and CFX; prox RCA occluded with R-R collats, dist RCA filled by L-R collats, inf HK, EF 55%, LVEDP 15 mmHg.  PCI:  Unsuccessful angioplasty of RCA (late presentation of inf MI) - tx medically.   Essential hypertension    GERD (gastroesophageal reflux disease)    Hyperlipidemia    NSTEMI (non-ST elevated myocardial infarction) (HCC) 03/11/14   Renal cell carcinoma of right kidney (HCC)    Partial nephrectomy in 2013   Past Surgical History:  Procedure Laterality Date   AORTIC VALVE REPLACEMENT N/A 05/18/2023   Procedure: AORTIC VALVE REPLACEMENT (AVR) USING INSPIRIS RESILIA 23 MM AORTIC VALVE;  Surgeon: Alleen Borne, MD;  Location: [facility] OR;  Service: Open Heart Surgery;  Laterality: N/A;   BACK SURGERY     1989   CERVICAL DISC SURGERY  04/29/2021   COLONOSCOPY N/A 12/11/2020   Procedure: COLONOSCOPY;  Surgeon: Franky Macho, MD;  Location: [facility];  Service: Gastroenterology;  Laterality: N/A;   LEFT HEART CATHETERIZATION WITH CORONARY ANGIOGRAM N/A 03/17/2014   Procedure: LEFT HEART CATHETERIZATION WITH CORONARY ANGIOGRAM;   Surgeon: Corky Crafts, MD;  Location: [facility] CATH LAB;  Service: Cardiovascular;  Laterality: N/A;   POLYPECTOMY  12/11/2020   Procedure: POLYPECTOMY;  Surgeon: Franky Macho, MD;  Location: [facility];  Service: Gastroenterology;;   REPLACEMENT ASCENDING AORTA N/A 05/18/2023   Procedure: REPLACEMENT OF ASCENDING AORTIC ANEURYSM USING A 30 X 10 MM HEMASHIELD PLATINUM GRAFT;  Surgeon: Alleen Borne, MD;  Location: [facility] OR;  Service: Open Heart Surgery;  Laterality: N/A;  CIRC ARREST   RIGHT HEART CATH AND CORONARY ANGIOGRAPHY N/A 04/03/2023   Procedure: RIGHT HEART CATH AND CORONARY ANGIOGRAPHY;  Surgeon: Tonny Bollman, MD;  Location: [facility] INVASIVE CV LAB;  Service: Cardiovascular;  Laterality: N/A;   ROBOT ASSISTED LAPAROSCOPIC NEPHRECTOMY  01/14/2012   Procedure: ROBOTIC ASSISTED LAPAROSCOPIC NEPHRECTOMY;  Surgeon: Milford Cage, MD;  Location: [facility] ORS;  Service: Urology;  Laterality: Right;  Robot Laparoscopic Right Partial Nephrectomy     TEE WITHOUT CARDIOVERSION N/A 05/18/2023   Procedure: TRANSESOPHAGEAL ECHOCARDIOGRAM;  Surgeon: Alleen Borne, MD;  Location: [facility] OR;  Service: Open Heart Surgery;  Laterality: N/A;   TONSILLECTOMY     Patient Active Problem List   Diagnosis Date Noted   Atrial fibrillation (HCC) 06/08/2023   Encounter for therapeutic drug monitoring 06/08/2023   CVA (cerebral vascular accident) (HCC) 05/25/2023   S/P AVR (  aortic valve replacement) 05/18/2023   Chronic cough 02/20/2023   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 [facility]) 02/15/2021   Right leg weakness 02/07/2021   Allodynia 02/07/2021   Gait disturbance 02/07/2021   White matter abnormality on MRI of brain 02/07/2021   Hyperreflexia 02/07/2021   Special screening for malignant neoplasms, colon    Adenomatous polyp of transverse colon    Diverticulosis of large intestine without diverticulitis    PSVT (paroxysmal supraventricular tachycardia) 05/29/2014    Precordial pain 04/19/2014   Coronary atherosclerosis of native coronary artery 04/06/2014   Essential hypertension, benign 04/06/2014   HLD (hyperlipidemia) 04/06/2014    ONSET DATE: 05/18/23  REFERRING DIAG: I63.9 (ICD-10-CM) - Cerebrovascular accident (CVA), unspecified mechanism (HCC)   THERAPY DIAG:  Other symptoms and signs involving the nervous system  Other lack of coordination  Muscle weakness (generalized)  Rationale for Evaluation and Treatment: Rehabilitation  SUBJECTIVE:                                                                                                                                                                                             SUBJECTIVE STATEMENT: 07/23/23:  Patient states that he's doing okay today and just sore from doing the exercises. Reports that he has been lifting his arm above overhead especially when reaching for the dishes. Reports that he's still on sternal precautions (for 12 weeks beginning 05/18/23).  Eval:  Pt went into open heart surgery on 7/1 and had small stroke after surgery on primary RUE. Pt has had leg tests over the past few years regarding nerve conduction and circulation issues. Extensive discussion regarding multiple medical issues surrounding BLE weakness hsitory prior to stroke. Was avid golfer. "Along as I got some hope, I with you all the way."  Pt accompanied by: self  PERTINENT HISTORY:  67 year old male who [facility] a ascending aortic aneurysm repair on 05/18/2023.  Patient with blood loss given treatment with iron replacement.  Later on 7 of 3/24 patient noted with right hand weakness.  Rapid response was called.  Patient was noted to be in atrial fibrillation with RVR and was hypotensive.  MRI showed scattered small acute infarction left cerebral hemisphere in both cerebral hemispheres.  C3 spinal surgery Open heart surgery on 05/18/23.  LBP and BLE weakness Dec'22 to current.   PAIN:   Are you having pain?  Pt reports chronic pain but nothing new.   PRECAUTIONS: Other: on sternal precautions for 12 weeks (till end of September 2024)  RED FLAGS: None   WEIGHT BEARING RESTRICTIONS: No  FALLS:  Has patient fallen in last 6 months? No  LIVING ENVIRONMENT: Lives with: lives with their family Lives in: House/apartment Stairs:  2 steps in/out, no concerns.  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 Pt had Dme avilable prior to stroke but was not using those  dependently, only when back pain was bad.   PATIENT GOALS: "golf might be a lofty goal"  OBJECTIVE:   DIAGNOSTIC FINDINGS:   IMPRESSION: Scattered small acute infarcts in the left cerebellar hemisphere and both cerebral hemispheres, likely embolic in etiology.  COGNITION: Overall cognitive status: Within functional limits for tasks assessed   SENSATION: WFL  COORDINATION: WFL  POSTURE: No Significant postural limitations  LOWER EXTREMITY ROM:     Active  Right Eval Left Eval  Hip flexion    Hip extension    Hip abduction    Hip adduction    Hip internal rotation    Hip external rotation    Knee flexion    Knee extension    Ankle dorsiflexion    Ankle plantarflexion    Ankle inversion    Ankle eversion     (Blank rows = not tested)  LOWER EXTREMITY MMT:    MMT Right Eval Left Eval Right 07/21/23 Left  07/21/23  Hip flexion   4/5 5/5  Hip extension   3+ Sidelying for sternal precaution 4-/5  Hip abduction   3+ 4/5  Hip adduction      Hip internal rotation      Hip external rotation      Knee flexion      Knee extension 3+ 3+    Ankle dorsiflexion 2+     Ankle plantarflexion      Ankle inversion      Ankle eversion      (Blank rows = not tested)     GAIT: Gait pattern: decreased step length- Right, decreased ankle  dorsiflexion- Right, and Right foot flat Distance walked: 185 feet Assistive device utilized: Walker - 2 wheeled Level of assistance: SBA Comments: Patient ambulating with steady gait pattern without cueing, ambulating with right flatfoot and sliding through RLE swing.  FUNCTIONAL TESTS:  30 seconds chair stand test:10x 2 minute walk test: 118ft  PATIENT SURVEYS:  ABC scale 45% impairment.   TODAY'S TREATMENT:                                                                                                                              DATE:  07/23/23 Educated on the importance of compliance to sternal precautions Seated hamstring stretch x 30" x 3 Standing:  Heel raises x 10 x 2  Toe raises x 10 x 2  Marches x 10 x 2  Mini squats x 3" x 10 x 2  07/21/23:  Reviewed goals Educated importance of HEP Compliance  DGI with no AD, SBA with gait belt for safety MMT for hips Supine: Bridge Sidelying: Abduction Seated: STS eccentric control  - Toe raises  DGI complete with no AD, SBA for safety 1. Gait level surface (1) Moderate Impairment: Walks 20', slow speed, abnormal gait pattern, evidence for imbalance. 2. Change in gait speed (1) Moderate Impairment: Makes only minor adjustments to walking speed, or accomplishes a change in speed with significant gait deviations, or changes speed but has significant gait deviations, or changes speed but loses balance but is able to recover and continue walking. 3. Gait with horizontal head turns (1) Moderate Impairment: Performs head turns with moderate change in gait velocity, slows down, staggers but recovers, can continue to walk. 4. Gait with vertical head turns (2) Mild Impairment: Performs head turns smoothly with slight change in gait velocity, i.e., minor disruption to smooth gait path or uses walking aid. 5. Gait and pivot turn (1) Moderate Impairment: Turns slowly, requires verbal cueing, requires several small steps to catch balance  following turn and stop. 6. Step over obstacle (1) Moderate Impairment: Is able to step over box but must stop, then step over. May require verbal cueing. 7. Step around obstacles (2) Mild Impairment: Is able to step around both cones, but must slow down and adjust steps to clear cones. 8. Stairs (1) Moderate Impairment: Two feet to a stair, must use rail.  TOTAL SCORE: 10 / 24    PT evaluation, findings, prognosis, frequency, and palsy.   PATIENT EDUCATION: Education details: Education on sternal precautions  Person educated: Patient Education method: Explanation Education comprehension: verbalized understanding  HOME EXERCISE PROGRAM: Access Code: [email or web address] URL: [email or web address] Date: 07/21/2023 Prepared by: Becky Sax  Exercises - Supine Bridge  - 2 x daily - 7 x weekly - 1 sets - 10 reps - 5" hold - Sidelying Hip Abduction  - 1 x daily - 7 x weekly - 3 sets - 10 reps - 3" hold - Sit to Stand Without Arm Support  - 3 x daily - 7 x weekly - 1 sets - 10 reps - Seated Toe Raise  - 3 x daily - 7 x weekly - 1 sets - 10 reps - 5" hold  GOALS: Goals reviewed with patient? No  SHORT TERM GOALS: Target date: 08/13/2023  Pt and caregiver will be independent with HEP in order to demonstrate participation in Physical Therapy POC.  Baseline: Goal status: IN PROGRESS  2.  Pt will report > 25% improvement in subjective safety/balance concerns in order to demonstrate improved self awareness of balance deficits.  Baseline:  Goal status: IN PROGRESS  LONG TERM GOALS: Target date: 09/10/2023  Pt will increase 30 sec STS score by > 4 reps in order to demonstrate improved functional safety and balance skills in ADL/mobility.   Baseline: 10x Goal status: IN PROGRESS  2.  Pt will improve 2 MWT to >300 with LRAD in order to demonstrate improved functional mobility capacity in community setting.  Baseline: 185 ft with RW Goal status: IN PROGRESS  3.  Pt will  improve ABC score by >15%  in order to demonstrate improved perceived balance with functional goals and outcomes. Baseline: See objective Goal status: IN PROGRESS  4.  Pt will report > 50% improvement in subjective safety/balance concerns in order to demonstrate improved self awareness of balance deficits..  Baseline: See objective Goal status: IN PROGRESS  ASSESSMENT:  CLINICAL IMPRESSION: 07/23/23:  Interventions today were geared towards LE strengthening and flexibility. Tolerated  all activities without worsening of symptoms. Demonstrated appropriate levels of fatigue. Rest periods provided. Provided slight amount of cueing to ensure correct execution of activity with good carry-over. To date, skilled PT is required to address the impairments and improve function.   Eval:  Patient is a 67y.o. male who was seen today for physical therapy evaluation and treatment for I63.9 (ICD-10-CM) - Cerebrovascular accident (CVA), unspecified mechanism (HCC).  Pt currently is living with son and grandchildren, with intermittent assistance for ADLs.  Independent with rolling walker for ambulation household.  Supervision ambulation with assistive device and community setting.  Prior to aortic aneurysm repair onset of CVA, patient was having decline in BLE muscle strength due to unknown back reasons, that have gone undiagnosed due to medical issues.  Prior to these events, patient was independent with all aspects of living, avid golfer.. Based upon today's evaluation, pt is demonstrating functional mobility, ambulation, ADL impairments due to new onset of CVA, BLE muscle weakness, balance limitations. Pt would benefit from skilled physical therapy services to address the above impairments/limitations and improve overall functional mobility and quality of life..   OBJECTIVE IMPAIRMENTS: Abnormal gait, decreased balance, decreased mobility, decreased ROM, decreased strength, and hypomobility.   ACTIVITY LIMITATIONS:  carrying, lifting, bending, sitting, squatting, and locomotion level  PARTICIPATION LIMITATIONS: driving, community activity, and yard work  PERSONAL FACTORS: Age are also affecting patient's functional outcome.   REHAB POTENTIAL: Excellent  CLINICAL DECISION MAKING: Evolving/moderate complexity  EVALUATION COMPLEXITY: Moderate  PLAN:  PT FREQUENCY: 2x/week  PT DURATION: 8 weeks  PLANNED INTERVENTIONS: Therapeutic exercises, Therapeutic activity, Neuromuscular re-education, Balance training, Gait training, Patient/Family education, Self Care, Joint mobilization, Stair training, Orthotic/Fit training, DME instructions, Manual therapy, and Re-evaluation  PLAN FOR NEXT SESSION: Continue POC and may progress as tolerated with emphasis on LE functional strengthening.   Tish Frederickson. Dre Gamino, PT, DPT, OCS Board-Certified Clinical Specialist in Orthopedic PT PT Compact Privilege # ([place]): [id number] T 07/23/2023, 2:54 PM

## 2023-07-27 ENCOUNTER — Ambulatory Visit (HOSPITAL_COMMUNITY): Payer: Medicare Other | Admitting: Physical Therapy

## 2023-07-27 DIAGNOSIS — I639 Cerebral infarction, unspecified: Secondary | ICD-10-CM

## 2023-07-27 DIAGNOSIS — M6281 Muscle weakness (generalized): Secondary | ICD-10-CM

## 2023-07-27 NOTE — Therapy (Signed)
OUTPATIENT PHYSICAL THERAPY NEURO TREATMENT   Patient Name: [name] MRN: [id number] DOB:Apr 14, 1956, 67 y.o., male Today's Date: 07/27/2023   PCP: [facility] REFERRING PROVIDER: Erick Colace, MD  END OF SESSION:  PT End of Session - 07/27/23 1600    Visit Number 4    Number of Visits 16    Date for PT Re-Evaluation 09/10/23    Authorization Type Medicare part A & B    Authorization Time Period no visit limit; no auth    Progress Note Due on Visit 10    PT Start Time 1517    PT Stop Time 1600    PT Time Calculation (min) 43 min    Activity Tolerance Patient tolerated treatment well    Behavior During Therapy WFL for tasks assessed/performed             Past Medical History:  Diagnosis Date   Arthritis    Coronary atherosclerosis of native coronary artery    a. NSTEMI (02/2014):  LHC (02/2014):  Mild disease in LAD and CFX; prox RCA occluded with R-R collats, dist RCA filled by L-R collats, inf HK, EF 55%, LVEDP 15 mmHg.  PCI:  Unsuccessful angioplasty of RCA (late presentation of inf MI) - tx medically.   Essential hypertension    GERD (gastroesophageal reflux disease)    Hyperlipidemia    NSTEMI (non-ST elevated myocardial infarction) (HCC) 03/11/14   Renal cell carcinoma of right kidney (HCC)    Partial nephrectomy in 2013   Past Surgical History:  Procedure Laterality Date   AORTIC VALVE REPLACEMENT N/A 05/18/2023   Procedure: AORTIC VALVE REPLACEMENT (AVR) USING INSPIRIS RESILIA 23 MM AORTIC VALVE;  Surgeon: Alleen Borne, MD;  Location: [facility] OR;  Service: Open Heart Surgery;  Laterality: N/A;   BACK SURGERY     1989   CERVICAL DISC SURGERY  04/29/2021   COLONOSCOPY N/A 12/11/2020   Procedure: COLONOSCOPY;  Surgeon: Franky Macho, MD;  Location: [facility] SUITE;  Service: Gastroenterology;  Laterality: N/A;   LEFT HEART CATHETERIZATION WITH CORONARY ANGIOGRAM N/A 03/17/2014   Procedure: LEFT HEART CATHETERIZATION WITH CORONARY ANGIOGRAM;   Surgeon: Corky Crafts, MD;  Location: [facility] CATH LAB;  Service: Cardiovascular;  Laterality: N/A;   POLYPECTOMY  12/11/2020   Procedure: POLYPECTOMY;  Surgeon: Franky Macho, MD;  Location: [facility];  Service: Gastroenterology;;   REPLACEMENT ASCENDING AORTA N/A 05/18/2023   Procedure: REPLACEMENT OF ASCENDING AORTIC ANEURYSM USING A 30 X 10 MM HEMASHIELD PLATINUM GRAFT;  Surgeon: Alleen Borne, MD;  Location: [facility] OR;  Service: Open Heart Surgery;  Laterality: N/A;  CIRC ARREST   RIGHT HEART CATH AND CORONARY ANGIOGRAPHY N/A 04/03/2023   Procedure: RIGHT HEART CATH AND CORONARY ANGIOGRAPHY;  Surgeon: Tonny Bollman, MD;  Location: [facility] INVASIVE CV LAB;  Service: Cardiovascular;  Laterality: N/A;   ROBOT ASSISTED LAPAROSCOPIC NEPHRECTOMY  01/14/2012   Procedure: ROBOTIC ASSISTED LAPAROSCOPIC NEPHRECTOMY;  Surgeon: Milford Cage, MD;  Location: [facility] ORS;  Service: Urology;  Laterality: Right;  Robot Laparoscopic Right Partial Nephrectomy     TEE WITHOUT CARDIOVERSION N/A 05/18/2023   Procedure: TRANSESOPHAGEAL ECHOCARDIOGRAM;  Surgeon: Alleen Borne, MD;  Location: [facility] OR;  Service: Open Heart Surgery;  Laterality: N/A;   TONSILLECTOMY     Patient Active Problem List   Diagnosis Date Noted   Atrial fibrillation (HCC) 06/08/2023   Encounter for therapeutic drug monitoring 06/08/2023   CVA (cerebral vascular accident) (HCC) 05/25/2023   S/P AVR (aortic  valve replacement) 05/18/2023   Chronic cough 02/20/2023   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 [facility]) 02/15/2021   Right leg weakness 02/07/2021   Allodynia 02/07/2021   Gait disturbance 02/07/2021   White matter abnormality on MRI of brain 02/07/2021   Hyperreflexia 02/07/2021   Special screening for malignant neoplasms, colon    Adenomatous polyp of transverse colon    Diverticulosis of large intestine without diverticulitis    PSVT (paroxysmal supraventricular tachycardia) 05/29/2014    Precordial pain 04/19/2014   Coronary atherosclerosis of native coronary artery 04/06/2014   Essential hypertension, benign 04/06/2014   HLD (hyperlipidemia) 04/06/2014    ONSET DATE: 05/18/23  REFERRING DIAG: I63.9 (ICD-10-CM) - Cerebrovascular accident (CVA), unspecified mechanism (HCC)   THERAPY DIAG:  Muscle weakness (generalized)  Cerebrovascular accident (CVA), unspecified mechanism (HCC)  Rationale for Evaluation and Treatment: Rehabilitation  SUBJECTIVE:                                                                                                                                                                                             SUBJECTIVE STATEMENT:Pt states that he has been doing his exercises everyday.   Eval:  Pt went into open heart surgery on 7/1 and had small stroke after surgery on primary RUE. Pt has had leg tests over the past few years regarding nerve conduction and circulation issues. Extensive discussion regarding multiple medical issues surrounding BLE weakness hsitory prior to stroke. Was avid golfer. "Along as I got some hope, I with you all the way."  Pt accompanied by: self  PERTINENT HISTORY:  67 year old male who [facility] a ascending aortic aneurysm repair on 05/18/2023.  Patient with blood loss given treatment with iron replacement.  Later on 7 of 3/24 patient noted with right hand weakness.  Rapid response was called.  Patient was noted to be in atrial fibrillation with RVR and was hypotensive.  MRI showed scattered small acute infarction left cerebral hemisphere in both cerebral hemispheres.  C3 spinal surgery Open heart surgery on 05/18/23.  LBP and BLE weakness Dec'22 to current.   PAIN:  Are you having pain?  Pt reports chronic pain but nothing new.   PRECAUTIONS: Other: on sternal precautions for 12 weeks (till end of September 2024)  RED FLAGS: None   WEIGHT BEARING RESTRICTIONS: No  FALLS: Has patient fallen  in last 6 months? No  LIVING ENVIRONMENT: Lives with: lives with their family Lives in: House/apartment Stairs:  2 steps in/out, no concerns.  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 Pt had Dme avilable prior to stroke but was not using those  dependently, only when back pain was bad.   PATIENT GOALS: "golf might be a lofty goal"  OBJECTIVE:   DIAGNOSTIC FINDINGS:   IMPRESSION: Scattered small acute infarcts in the left cerebellar hemisphere and both cerebral hemispheres, likely embolic in etiology.  COGNITION: Overall cognitive status: Within functional limits for tasks assessed   SENSATION: WFL  COORDINATION: WFL  POSTURE: No Significant postural limitations  LOWER EXTREMITY ROM:     Active  Right Eval Left Eval  Hip flexion    Hip extension    Hip abduction    Hip adduction    Hip internal rotation    Hip external rotation    Knee flexion    Knee extension    Ankle dorsiflexion    Ankle plantarflexion    Ankle inversion    Ankle eversion     (Blank rows = not tested)  LOWER EXTREMITY MMT:    MMT Right Eval Left Eval Right 07/21/23 Left  07/21/23  Hip flexion   4/5 5/5  Hip extension   3+ Sidelying for sternal precaution 4-/5  Hip abduction   3+ 4/5  Hip adduction      Hip internal rotation      Hip external rotation      Knee flexion      Knee extension 3+ 3+    Ankle dorsiflexion 2+     Ankle plantarflexion      Ankle inversion      Ankle eversion      (Blank rows = not tested)     GAIT: Gait pattern: decreased step length- Right, decreased ankle dorsiflexion- Right, and Right foot flat Distance walked: 185 feet Assistive device utilized: Walker - 2 wheeled Level of assistance: SBA Comments: Patient ambulating with steady gait pattern without cueing, ambulating with right flatfoot and  sliding through RLE swing.  FUNCTIONAL TESTS:  30 seconds chair stand test:10x 2 minute walk test: 127ft  PATIENT SURVEYS:  ABC scale 45% impairment.   TODAY'S TREATMENT:                                                                                                                              DATE:  07/27/23 Heel raise x 10  Squat x 10  Marching x 10  Single leg stance x 3 one finger tip touch Sit to stand x 10 x 2  Side stepping with blue theraband x 2RT  Standing hip abduction with blue theraband  x 10 B  Standing hip extension with blue theraband x 10 B  Step up 4" step x 10 B  RT Knee flexion x 10  07/23/23 Educated on the importance of compliance to sternal precautions Seated hamstring stretch x 30" x 3 Standing:  Heel raises x 10 x 2  Toe raises x 10 x 2  Marches x 10 x 2  Mini squats x 3" x 10 x 2  07/21/23:  Reviewed goals Educated importance of HEP Compliance DGI with no AD, SBA with gait belt for safety MMT for hips Supine: Bridge Sidelying: Abduction Seated: STS eccentric control  - Toe raises  DGI complete with no AD, SBA for safety 1. Gait level surface (1) Moderate Impairment: Walks 20', slow speed, abnormal gait pattern, evidence for imbalance. 2. Change in gait speed (1) Moderate Impairment: Makes only minor adjustments to walking speed, or accomplishes a change in speed with significant gait deviations, or changes speed but has significant gait deviations, or changes speed but loses balance but is able to recover and continue walking. 3. Gait with horizontal head turns (1) Moderate Impairment: Performs head turns with moderate change in gait velocity, slows down, staggers but recovers, can continue to walk. 4. Gait with vertical head turns (2) Mild Impairment: Performs head turns smoothly with slight change in gait velocity, i.e., minor disruption to smooth gait path or uses walking aid. 5. Gait and pivot turn (1) Moderate Impairment: Turns slowly,  requires verbal cueing, requires several small steps to catch balance following turn and stop. 6. Step over obstacle (1) Moderate Impairment: Is able to step over box but must stop, then step over. May require verbal cueing. 7. Step around obstacles (2) Mild Impairment: Is able to step around both cones, but must slow down and adjust steps to clear cones. 8. Stairs (1) Moderate Impairment: Two feet to a stair, must use rail.  TOTAL SCORE: 10 / 24    PT evaluation, findings, prognosis, frequency, and palsy.   PATIENT EDUCATION: Education details: Education on sternal precautions  Person educated: Patient Education method: Explanation Education comprehension: verbalized understanding  HOME EXERCISE PROGRAM: Access Code: [email or web address] URL: [email or web address] Date: 07/21/2023 Prepared by: Becky Sax  Exercises - Supine Bridge  - 2 x daily - 7 x weekly - 1 sets - 10 reps - 5" hold - Sidelying Hip Abduction  - 1 x daily - 7 x weekly - 3 sets - 10 reps - 3" hold - Sit to Stand Without Arm Support  - 3 x daily - 7 x weekly - 1 sets - 10 reps - Seated Toe Raise  - 3 x daily - 7 x weekly - 1 sets - 10 reps - 5" hold      07/27/23:              - Mini Squat with Counter Support  - 2 x daily - 7 x weekly - 1 sets - 10 reps - 5" hold - Hip Extension with Resistance Loop  - 1 x daily - 7 x weekly - 3 sets - 10 reps - Standing Hip Abduction with Resistance at Ankles and Counter Support  - 1 x daily - 7 x weekly - 3 sets - 10 reps - Standing Tandem Balance with Counter Support  - 1 x daily - 7 x weekly - 3 sets - 10 reps GOALS: Goals reviewed with patient? No  SHORT TERM GOALS: Target date: 08/13/2023  Pt and caregiver will be independent with HEP in order to demonstrate participation in Physical Therapy POC.  Baseline: Goal status: IN PROGRESS  2.  Pt will report > 25% improvement in subjective safety/balance concerns in order to demonstrate improved self awareness of  balance deficits.  Baseline:  Goal status: IN PROGRESS  LONG TERM GOALS: Target date: 09/10/2023  Pt will increase 30 sec STS score by > 4 reps in order to demonstrate improved functional safety and balance skills in ADL/mobility.   Baseline: 10x Goal status: IN PROGRESS  2.  Pt will improve 2 MWT to >300 with LRAD in order to demonstrate improved functional mobility capacity in community setting.  Baseline: 185 ft with RW Goal status: IN PROGRESS  3.  Pt will improve ABC score by >15%  in order to demonstrate improved perceived balance with functional goals and outcomes. Baseline: See objective Goal status: IN PROGRESS  4.  Pt will report > 50% improvement in subjective safety/balance concerns in order to demonstrate improved self awareness of balance deficits..  Baseline: See objective Goal status: IN PROGRESS  ASSESSMENT:  CLINICAL IMPRESSION: Advanced program to include balance activity as well as resisted strengthening.  Therapist updated HEP.  Pt continues to demonstrate decreased strength, balance and decreased activity tolerance. [name] will continue to benefit from skilled PT is to address the impairments and improve function.   Eval:  Patient is a 67y.o. male who was seen today for physical therapy evaluation and treatment for I63.9 (ICD-10-CM) - Cerebrovascular accident (CVA), unspecified mechanism (HCC).  Pt currently is living with son and grandchildren, with intermittent assistance for ADLs.  Independent with rolling walker for ambulation household.  Supervision ambulation with assistive device and community setting.  Prior to aortic aneurysm repair onset of CVA, patient was having decline in BLE muscle strength due to unknown back reasons, that have gone undiagnosed due to medical issues.  Prior to these events, patient was independent with all aspects of living, avid golfer.. Based upon today's evaluation, pt is demonstrating functional mobility, ambulation, ADL  impairments due to new onset of CVA, BLE muscle weakness, balance limitations. Pt would benefit from skilled physical therapy services to address the above impairments/limitations and improve overall functional mobility and quality of life..   OBJECTIVE IMPAIRMENTS: Abnormal gait, decreased balance, decreased mobility, decreased ROM, decreased strength, and hypomobility.   ACTIVITY LIMITATIONS: carrying, lifting, bending, sitting, squatting, and locomotion level  PARTICIPATION LIMITATIONS: driving, community activity, and yard work  PERSONAL FACTORS: Age are also affecting patient's functional outcome.   REHAB POTENTIAL: Excellent  CLINICAL DECISION MAKING: Evolving/moderate complexity  EVALUATION COMPLEXITY: Moderate  PLAN:  PT FREQUENCY: 2x/week  PT DURATION: 8 weeks  PLANNED INTERVENTIONS: Therapeutic exercises, Therapeutic activity, Neuromuscular re-education, Balance training, Gait training, Patient/Family education, Self Care, Joint mobilization, Stair training, Orthotic/Fit training, DME instructions, Manual therapy, and Re-evaluation  PLAN FOR NEXT SESSION: Continue POC and may progress as tolerated with emphasis on LE functional strengthening.  Virgina Organ, PT CLT [phone]  07/27/2023, 4:00 PM

## 2023-07-29 ENCOUNTER — Encounter (HOSPITAL_COMMUNITY): Payer: Self-pay | Admitting: Occupational Therapy

## 2023-07-29 ENCOUNTER — Ambulatory Visit (HOSPITAL_COMMUNITY): Payer: Medicare Other | Admitting: Occupational Therapy

## 2023-07-29 ENCOUNTER — Ambulatory Visit (HOSPITAL_COMMUNITY): Payer: Medicare Other

## 2023-07-29 DIAGNOSIS — I639 Cerebral infarction, unspecified: Secondary | ICD-10-CM | POA: Diagnosis not present

## 2023-07-29 DIAGNOSIS — R278 Other lack of coordination: Secondary | ICD-10-CM

## 2023-07-29 DIAGNOSIS — M6281 Muscle weakness (generalized): Secondary | ICD-10-CM

## 2023-07-29 DIAGNOSIS — R29818 Other symptoms and signs involving the nervous system: Secondary | ICD-10-CM

## 2023-07-29 DIAGNOSIS — Z7409 Other reduced mobility: Secondary | ICD-10-CM

## 2023-07-29 NOTE — Therapy (Signed)
OUTPATIENT OCCUPATIONAL THERAPY NEURO TREATMENT  Patient Name: [name] MRN: [id number] DOB:19-Aug-1956, 67 y.o., male Today's Date: 07/29/2023    END OF SESSION:  OT End of Session - 07/29/23 1532     Visit Number 4    Number of Visits 8    Date for OT Re-Evaluation 08/15/23    Authorization Type 1) Medicare A & B  2) [employer]    Progress Note Due on Visit 10    OT Start Time 1436    OT Stop Time 1520    OT Time Calculation (min) 44 min    Activity Tolerance Patient tolerated treatment well    Behavior During Therapy WFL for tasks assessed/performed              Past Medical History:  Diagnosis Date   Arthritis    Coronary atherosclerosis of native coronary artery    a. NSTEMI (02/2014):  LHC (02/2014):  Mild disease in LAD and CFX; prox RCA occluded with R-R collats, dist RCA filled by L-R collats, inf HK, EF 55%, LVEDP 15 mmHg.  PCI:  Unsuccessful angioplasty of RCA (late presentation of inf MI) - tx medically.   Essential hypertension    GERD (gastroesophageal reflux disease)    Hyperlipidemia    NSTEMI (non-ST elevated myocardial infarction) (HCC) 03/11/14   Renal cell carcinoma of right kidney (HCC)    Partial nephrectomy in 2013   Past Surgical History:  Procedure Laterality Date   AORTIC VALVE REPLACEMENT N/A 05/18/2023   Procedure: AORTIC VALVE REPLACEMENT (AVR) USING INSPIRIS RESILIA 23 MM AORTIC VALVE;  Surgeon: Alleen Borne, MD;  Location: [facility] OR;  Service: Open Heart Surgery;  Laterality: N/A;   BACK SURGERY     1989   CERVICAL DISC SURGERY  04/29/2021   COLONOSCOPY N/A 12/11/2020   Procedure: COLONOSCOPY;  Surgeon: Franky Macho, MD;  Location: [facility] SUITE;  Service: Gastroenterology;  Laterality: N/A;   LEFT HEART CATHETERIZATION WITH CORONARY ANGIOGRAM N/A 03/17/2014   Procedure: LEFT HEART CATHETERIZATION WITH CORONARY ANGIOGRAM;  Surgeon: Corky Crafts, MD;  Location: [facility] CATH LAB;  Service: Cardiovascular;  Laterality: N/A;   POLYPECTOMY   12/11/2020   Procedure: POLYPECTOMY;  Surgeon: Franky Macho, MD;  Location: [facility] SUITE;  Service: Gastroenterology;;   REPLACEMENT ASCENDING AORTA N/A 05/18/2023   Procedure: REPLACEMENT OF ASCENDING AORTIC ANEURYSM USING A 30 X 10 MM HEMASHIELD PLATINUM GRAFT;  Surgeon: Alleen Borne, MD;  Location: [facility] OR;  Service: Open Heart Surgery;  Laterality: N/A;  CIRC ARREST   RIGHT HEART CATH AND CORONARY ANGIOGRAPHY N/A 04/03/2023   Procedure: RIGHT HEART CATH AND CORONARY ANGIOGRAPHY;  Surgeon: Tonny Bollman, MD;  Location: [facility] INVASIVE CV LAB;  Service: Cardiovascular;  Laterality: N/A;   ROBOT ASSISTED LAPAROSCOPIC NEPHRECTOMY  01/14/2012   Procedure: ROBOTIC ASSISTED LAPAROSCOPIC NEPHRECTOMY;  Surgeon: Milford Cage, MD;  Location: [facility] ORS;  Service: Urology;  Laterality: Right;  Robot Laparoscopic Right Partial Nephrectomy     TEE WITHOUT CARDIOVERSION N/A 05/18/2023   Procedure: TRANSESOPHAGEAL ECHOCARDIOGRAM;  Surgeon: Alleen Borne, MD;  Location[facility] OR;  Service: Open Heart Surgery;  Laterality: N/A;   TONSILLECTOMY     Patient Active Problem List   Diagnosis Date Noted   Atrial fibrillation (HCC) 06/08/2023   Encounter for therapeutic drug monitoring 06/08/2023   CVA (cerebral vascular accident) (HCC) 05/25/2023   S/P AVR (aortic valve replacement) 05/18/2023   Chronic cough 02/20/2023   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 [facility]) 02/15/2021   Right leg weakness 02/07/2021   Allodynia 02/07/2021   Gait disturbance 02/07/2021   White matter abnormality on MRI of brain 02/07/2021   Hyperreflexia 02/07/2021   Special screening for malignant neoplasms, colon    Adenomatous polyp of transverse colon    Diverticulosis of large intestine without diverticulitis    PSVT (paroxysmal supraventricular tachycardia) 05/29/2014   Precordial pain 04/19/2014   Coronary atherosclerosis of native coronary artery 04/06/2014   Essential hypertension,  benign 04/06/2014   HLD (hyperlipidemia) 04/06/2014   PCP: [name], PA-C REFERRING PROVIDER: Dr. Claudette Laws  ONSET DATE: 05/20/23  REFERRING DIAG: I63.9 (ICD-10-CM) - Cerebrovascular accident (CVA), unspecified mechanism (HCC)   THERAPY DIAG:  Other lack of coordination  Other symptoms and signs involving the nervous system  Rationale for Evaluation and Treatment: Rehabilitation  SUBJECTIVE:   SUBJECTIVE STATEMENT: S: "The exercises are hard sometimes."   PERTINENT HISTORY: Pt is a 67 year old male who presented to the [facility] for AVR and ascending aortic aneurysm repair on 05/18/2023. He had evidence of bicuspid aortic valve stenosis with 5.1 cm ascending aortic aneurysm and underwent repair by Dr. Laneta Simmers on 05/18/2023. He was extubated later that day. He had brief episode of hematuria after difficult Foley placement. Remains on Proscar for urinary retention. Intraoperative TEE with EF approximately 50%. Pacer wires and chest tubes removed on 7/03. Chest x-ray was stable and he was transferred out to stepdown unit. Acute blood loss anemia treated with oral iron replacement. Aspirin dosing decreased to 81 mg due to post-operative thrombocytopenia. Later in the evening on 7/03 he was noted to have right hand weakness and rapid response was called. He was noted to have atrial fibrillation with RVR and was hypotensive. He was started on an amiodarone infusion and cardiology consulted. He was transition to amiodarone 200 mg twice daily on 7/04. CT of head without evidence of acute infarct or hemorrhage, however brain MRI showed scattered small acute infarcts in the left cerebellar hemisphere in both cerebral hemispheres likely embolic in etiology.   PRECAUTIONS: Sternal and Fall  WEIGHT BEARING RESTRICTIONS: No  PAIN:  Are you having pain? No  FALLS: Has patient fallen in last 6 months? No  LIVING ENVIRONMENT: Lives with: lives with their family Lives in: House/apartment Stairs:  Yes: External: 2 steps; bilateral but cannot reach both Has following equipment at home: Single point cane and Walker - 2 wheeled  PLOF: Independent  PATIENT GOALS: To get stronger.   OBJECTIVE:   HAND DOMINANCE: Right  ADLs: Overall ADLs: Pt reports he requires increased time for ADLs. Is not driving yet. Pt reports meal preparation is difficult due to being fatigued. Picking up items is difficult, eating with right hand is difficult. Has difficulty with manipulating items.   ACTIVITY TOLERANCE: Activity tolerance: Limited due to recent heart sx  FUNCTIONAL OUTCOME MEASURES: FOTO: 63/100  UPPER EXTREMITY ROM:    BUE A/ROM is [facility]  UPPER EXTREMITY MMT:     MMT Right eval  Shoulder flexion 5/5  Shoulder abduction 5/5  Shoulder internal rotation 5/5  Shoulder external rotation 4+/5  Elbow flexion 5/5  Elbow extension 5/5  Wrist flexion 4/5  Wrist extension 5/5  Wrist ulnar deviation 4/5  Wrist radial deviation 4/5  Wrist pronation 5/5  Wrist supination 5/5  (Blank rows = not tested)  HAND FUNCTION: Grip strength: Right: 30 lbs; Left: 50 lbs, Lateral pinch: Right: 23 lbs, Left: 9 lbs, and 3 point pinch:  Right: 12 lbs, Left: 3 lbs  COORDINATION: 9 Hole Peg test: Right: 1' 04 sec; Left: 31.42 sec  SENSATION: Tingling in hands/feet at baseline  MUSCLE TONE: RUE: Mild    TODAY'S TREATMENT:                                                                                                                              DATE:   07/29/23 -Therapy Ball Exercises: flexion, protraction, overhead press, V ups, circles both directions, x12 -Dribbling Basket Ball 2x10 -wrist strengthening: 3lb weight, flexion, extension, supination/pronation, ulnar/radial deviation, x12 -Grooved Peg Board -Sponges: 19, 17 -Thumb abduction/adduction around OT's finger  07/23/23 -Weightbearing: in standing, on flat hands, weight shifting x60", reaching forward with LUE x15 -Shoulder A/ROM:  flexion, abduction, protraction, horizontal abduction, er/IR, x10 -Digit ROM: composite flexion, abduction/adduction, opposition, fingers taps, thumb abduction -Spinning cone with finger tips: x3 full spins -Pinch Strengthening: red, green, blue, black, resistance clips, tripod pinch up, lateral pinch down, x6 clips -stacking 6 cubes: first with fingers, then with green resistance clip -Finger Rubber Bands: yellow, thumb lifts, thumb presses, finger extensions, x10 each  07/21/23 -Sponges: 6, 9, 8 -Finger taps: 5 rounds -Thumb lifts: 10 reps -Weightbearing: pt in standing, holding full weighbearing on RUE while reaching to the left for pegs and then reaching across to place in grooved pegboard with LUE -Wrist A/ROM: flexion, extension, radial/ulnar deviation, forearm supination/pronation, 10 reps -Grip strengthening: hand gripper at 25#, large and medium beads with gripper vertical -Pinch strengthening: pt using lateral pinch and red clothespin to grasp and stack 3 towers of 3 sponges; 3 point pinch and yellow clothespin to replace in bucket -Pinch task: pt using tip pinch to remove grooved pegs from pegboard and place in holder. Elbow propped on towel to allow for wrist extension practice -Opposition to each digit, circle to full extension: 10 reps   PATIENT EDUCATION: Education details: Dispensing optician band exercises Person educated: Patient Education method: Programmer, multimedia, Facilities manager, and Handouts Education comprehension: verbalized understanding and returned demonstration  HOME EXERCISE PROGRAM: Eval: finger A/ROM 9/5: Finger Rubber Band Exercises   GOALS: Goals reviewed with patient? Yes  SHORT TERM GOALS: Target date: 08/15/23  Pt will be provided with and educated on HEP to improve mobility and functional use of right hand during ADLs.   Goal status: IN PROGRESS  2.  Pt will increase right grip strength by 20# and pinch strength by 4# or greater to improve ability to grasp  and hold items for hygiene and meal preparation tasks.   Goal status: IN PROGRESS  3.  Pt will increase right fine motor coordination required for tying shoes or manipulating small objects by completing 9 hole peg test in 45" or less.   Goal status: IN PROGRESS  4.  Pt will be educated on AE available to improve success and independence in ADL completion.   Goal status: IN PROGRESS    ASSESSMENT:  CLINICAL IMPRESSION: This session,  pt working on overall mobility and strengthening, as well as fine motor coordination. He continues to be frustrated with limited finger mobility and feels that most exercises are not helpful. With grooved peg board, he was unable to manipulate the grooved pegs, however he could pick them up and use the edge of the board to maneuver the pegs enough to place them in proper positioning's in the board. Verbal and tactile cuing provided throughout session for positioning and technique.   PERFORMANCE DEFICITS: in functional skills including ADLs, IADLs, coordination, proprioception, sensation, tone, strength, Fine motor control, and UE functional use    PLAN:  OT FREQUENCY: 2x/week  OT DURATION: 4 weeks  PLANNED INTERVENTIONS: therapeutic exercise, therapeutic activity, neuromuscular re-education, splinting, electrical stimulation, ultrasound, patient/family education, and DME and/or AE instructions, self-care  RECOMMENDED OTHER SERVICES: PT  CONSULTED AND AGREED WITH PLAN OF CARE: Patient  PLAN FOR NEXT SESSION: Follow up on HEP, begin grip and pinch strengthening, coordination tasks   Trish Mage, OTR/L [phone] 07/29/2023, 4:06 PM

## 2023-07-29 NOTE — Therapy (Signed)
OUTPATIENT PHYSICAL THERAPY NEURO TREATMENT   Patient Name: [name] MRN: [id number] DOB:[birth year], 67 y.o., male Today's Date: 07/29/2023   PCP: [facility] REFERRING PROVIDER: Erick Colace, MD  END OF SESSION:   PT End of Session - 07/29/23 1519     Visit Number 5    Number of Visits 16     Date for PT Re-Evaluation 09/10/23     Authorization Type Medicare part A & B    Authorization Time Period no visit limit; no auth     Progress Note Due on Visit 10    PT Start Time 1520     PT Stop Time 1600     PT Time Calculation (min) 40 min    Activity Tolerance Patient tolerated treatment well    Behavior During Therapy WFL for tasks assessed/performed             Past Medical History:  Diagnosis Date   Arthritis    Coronary atherosclerosis of native coronary artery    a. NSTEMI (02/2014):  LHC (02/2014):  Mild disease in LAD and CFX; prox RCA occluded with R-R collats, dist RCA filled by L-R collats, inf HK, EF 55%, LVEDP 15 mmHg.  PCI:  Unsuccessful angioplasty of RCA (late presentation of inf MI) - tx medically.   Essential hypertension    GERD (gastroesophageal reflux disease)    Hyperlipidemia    NSTEMI (non-ST elevated myocardial infarction) (HCC) 03/11/14   Renal cell carcinoma of right kidney (HCC)    Partial nephrectomy in 2013   Past Surgical History:  Procedure Laterality Date   AORTIC VALVE REPLACEMENT N/A 05/18/2023   Procedure: AORTIC VALVE REPLACEMENT (AVR) USING INSPIRIS RESILIA 23 MM AORTIC VALVE;  Surgeon: Alleen Borne, MD;  Location: [facility] OR;  Service: Open Heart Surgery;  Laterality: N/A;   BACK SURGERY     1989   CERVICAL DISC SURGERY  04/29/2021   COLONOSCOPY N/A 12/11/2020   Procedure: COLONOSCOPY;  Surgeon: Franky Macho, MD;  Location: [facility] END[facility] SUITE;  Service: Gastroenterology;  Laterality: N/A;   LEFT HEART CATHETERIZATION WITH CORONARY ANGIOGRAM N/A 03/17/2014   Procedure: LEFT HEART CATHETERIZATION WITH CORONARY  ANGIOGRAM;  Surgeon: Corky Crafts, MD;  Location: [facility] CATH LAB;  Service: Cardiovascular;  Laterality: N/A;   POLYPECTOMY  12/11/2020   Procedure: POLYPECTOMY;  Surgeon: Franky Macho, MD;  Location: [facility] SUITE;  Service: Gastroenterology;;   REPLACEMENT ASCENDING AORTA N/A 05/18/2023   Procedure: REPLACEMENT OF ASCENDING AORTIC ANEURYSM USING A 30 X 10 MM HEMASHIELD PLATINUM GRAFT;  Surgeon: Alleen Borne, MD;  Location: [facility] OR;  Service: Open Heart Surgery;  Laterality: N/A;  CIRC ARREST   RIGHT HEART CATH AND CORONARY ANGIOGRAPHY N/A 04/03/2023   Procedure: RIGHT HEART CATH AND CORONARY ANGIOGRAPHY;  Surgeon: Tonny Bollman, MD;  Location: [facility] INVASIVE CV LAB;  Service: Cardiovascular;  Laterality: N/A;   ROBOT ASSISTED LAPAROSCOPIC NEPHRECTOMY  01/14/2012   Procedure: ROBOTIC ASSISTED LAPAROSCOPIC NEPHRECTOMY;  Surgeon: Milford Cage, MD;  Location: [facility] ORS;  Service: Urology;  Laterality: Right;  Robot Laparoscopic Right Partial Nephrectomy     TEE WITHOUT CARDIOVERSION N/A 05/18/2023   Procedure: TRANSESOPHAGEAL ECHOCARDIOGRAM;  Surgeon: Alleen Borne, MD;  Location: [facility] OR;  Service: Open Heart Surgery;  Laterality: N/A;   TONSILLECTOMY     Patient Active Problem List   Diagnosis Date Noted   Atrial fibrillation (HCC) 06/08/2023   Encounter for therapeutic drug monitoring 06/08/2023   CVA (cerebral vascular accident) (  HCC) 05/25/2023   S/P AVR (aortic valve replacement) 05/18/2023   Chronic cough 02/20/2023   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 [facility]) 02/15/2021   Right leg weakness 02/07/2021   Allodynia 02/07/2021   Gait disturbance 02/07/2021   White matter abnormality on MRI of brain 02/07/2021   Hyperreflexia 02/07/2021   Special screening for malignant neoplasms, colon    Adenomatous polyp of transverse colon    Diverticulosis of large intestine without diverticulitis    PSVT (paroxysmal supraventricular tachycardia)  05/29/2014   Precordial pain 04/19/2014   Coronary atherosclerosis of native coronary artery 04/06/2014   Essential hypertension, benign 04/06/2014   HLD (hyperlipidemia) 04/06/2014    ONSET DATE: 05/18/23  REFERRING DIAG: I63.9 (ICD-10-CM) - Cerebrovascular accident (CVA), unspecified mechanism (HCC)   THERAPY DIAG:  Muscle weakness (generalized)  Other symptoms and signs involving the nervous system  Impaired functional mobility, balance, gait, and endurance  Rationale for Evaluation and Treatment: Rehabilitation  SUBJECTIVE:                                                                                                                                                                                             SUBJECTIVE STATEMENT: Patient states that his back is sore after the last session. However, patient reports that his back did not get sore 2 sessions prior to today's appointment. Patient states that he may not continue coming to PT if his back continues to get sore.  Eval:  Pt went into open heart surgery on 7/1 and had small stroke after surgery on primary RUE. Pt has had leg tests over the past few years regarding nerve conduction and circulation issues. Extensive discussion regarding multiple medical issues surrounding BLE weakness hsitory prior to stroke. Was avid golfer. "Along as I got some hope, I with you all the way."  Pt accompanied by: self  PERTINENT HISTORY:  67 year old male who [facility] a ascending aortic aneurysm repair on 05/18/2023.  Patient with blood loss given treatment with iron replacement.  Later on 7 of 3/24 patient noted with right hand weakness.  Rapid response was called.  Patient was noted to be in atrial fibrillation with RVR and was hypotensive.  MRI showed scattered small acute infarction left cerebral hemisphere in both cerebral hemispheres.  C3 spinal surgery Open heart surgery on 05/18/23.  LBP and BLE weakness Dec'22  to current.   PAIN:  Are you having pain?  Pt reports chronic pain but nothing new.   PRECAUTIONS: Other: on sternal precautions for 12 weeks (till end of September 2024)  RED FLAGS:  None   WEIGHT BEARING RESTRICTIONS: No  FALLS: Has patient fallen in last 6 months? No  LIVING ENVIRONMENT: Lives with: lives with their family Lives in: House/apartment Stairs:  2 steps in/out, no concerns.  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 Pt had Dme avilable prior to stroke but was not using those  dependently, only when back pain was bad.   PATIENT GOALS: "golf might be a lofty goal"  OBJECTIVE:   DIAGNOSTIC FINDINGS:   IMPRESSION: Scattered small acute infarcts in the left cerebellar hemisphere and both cerebral hemispheres, likely embolic in etiology.  COGNITION: Overall cognitive status: Within functional limits for tasks assessed   SENSATION: WFL  COORDINATION: WFL  POSTURE: No Significant postural limitations  LOWER EXTREMITY ROM:     Active  Right Eval Left Eval  Hip flexion    Hip extension    Hip abduction    Hip adduction    Hip internal rotation    Hip external rotation    Knee flexion    Knee extension    Ankle dorsiflexion    Ankle plantarflexion    Ankle inversion    Ankle eversion     (Blank rows = not tested)  LOWER EXTREMITY MMT:    MMT Right Eval Left Eval Right 07/21/23 Left  07/21/23  Hip flexion   4/5 5/5  Hip extension   3+ Sidelying for sternal precaution 4-/5  Hip abduction   3+ 4/5  Hip adduction      Hip internal rotation      Hip external rotation      Knee flexion      Knee extension 3+ 3+    Ankle dorsiflexion 2+     Ankle plantarflexion      Ankle inversion      Ankle eversion      (Blank rows = not tested)     GAIT: Gait pattern: decreased step length- Right,  decreased ankle dorsiflexion- Right, and Right foot flat Distance walked: 185 feet Assistive device utilized: Walker - 2 wheeled Level of assistance: SBA Comments: Patient ambulating with steady gait pattern without cueing, ambulating with right flatfoot and sliding through RLE swing.  FUNCTIONAL TESTS:  30 seconds chair stand test:10x 2 minute walk test: 161ft  PATIENT SURVEYS:  ABC scale 45% impairment.   TODAY'S TREATMENT:                                                                                                                              DATE:  07/29/23 Gastrocnemius slant board stretch x 30" x 3 Sit-to-stand x 10 x 2 Hip abd, RTB x 10 x 2 Hip ext, RTB x 10 x 2 Mini squats x 3" x 10 x 2 Heel taps on floor marker x 10 x 2 on each Heel  taps on cone x 10 x 2 on each Heel raises x 10 x 2 x 2 lbs High marches x 10 x 2 x 2 lbs  07/27/23 Heel raise x 10  Squat x 10  Marching x 10  Single leg stance x 3 one finger tip touch Sit to stand x 10 x 2  Side stepping with blue theraband x 2RT  Standing hip abduction with blue theraband  x 10 B  Standing hip extension with blue theraband x 10 B  Step up 4" step x 10 B  RT Knee flexion x 10   07/23/23 Educated on the importance of compliance to sternal precautions Seated hamstring stretch x 30" x 3 Standing:  Heel raises x 10 x 2  Toe raises x 10 x 2  Marches x 10 x 2  Mini squats x 3" x 10 x 2  07/21/23:  Reviewed goals Educated importance of HEP Compliance DGI with no AD, SBA with gait belt for safety MMT for hips Supine: Bridge Sidelying: Abduction Seated: STS eccentric control  - Toe raises  DGI complete with no AD, SBA for safety 1. Gait level surface (1) Moderate Impairment: Walks 20', slow speed, abnormal gait pattern, evidence for imbalance. 2. Change in gait speed (1) Moderate Impairment: Makes only minor adjustments to walking speed, or accomplishes a change in speed with significant gait deviations, or  changes speed but has significant gait deviations, or changes speed but loses balance but is able to recover and continue walking. 3. Gait with horizontal head turns (1) Moderate Impairment: Performs head turns with moderate change in gait velocity, slows down, staggers but recovers, can continue to walk. 4. Gait with vertical head turns (2) Mild Impairment: Performs head turns smoothly with slight change in gait velocity, i.e., minor disruption to smooth gait path or uses walking aid. 5. Gait and pivot turn (1) Moderate Impairment: Turns slowly, requires verbal cueing, requires several small steps to catch balance following turn and stop. 6. Step over obstacle (1) Moderate Impairment: Is able to step over box but must stop, then step over. May require verbal cueing. 7. Step around obstacles (2) Mild Impairment: Is able to step around both cones, but must slow down and adjust steps to clear cones. 8. Stairs (1) Moderate Impairment: Two feet to a stair, must use rail.  TOTAL SCORE: 10 / 24    PT evaluation, findings, prognosis, frequency, and palsy.   PATIENT EDUCATION: Education details: Education on sternal precautions  Person educated: Patient Education method: Explanation Education comprehension: verbalized understanding  HOME EXERCISE PROGRAM: Access Code: [email or web address] URL: [email or web address] Date: 07/21/2023 Prepared by: Becky Sax  Exercises - Supine Bridge  - 2 x daily - 7 x weekly - 1 sets - 10 reps - 5" hold - Sidelying Hip Abduction  - 1 x daily - 7 x weekly - 3 sets - 10 reps - 3" hold - Sit to Stand Without Arm Support  - 3 x daily - 7 x weekly - 1 sets - 10 reps - Seated Toe Raise  - 3 x daily - 7 x weekly - 1 sets - 10 reps - 5" hold      07/27/23:              - Mini Squat with Counter Support  - 2 x daily - 7 x weekly - 1 sets - 10 reps - 5" hold - Hip Extension with Resistance Loop  - 1 x daily - 7 x weekly -  3 sets - 10 reps - Standing Hip  Abduction with Resistance at Ankles and Counter Support  - 1 x daily - 7 x weekly - 3 sets - 10 reps - Standing Tandem Balance with Counter Support  - 1 x daily - 7 x weekly - 3 sets - 10 reps GOALS: Goals reviewed with patient? No  SHORT TERM GOALS: Target date: 08/13/2023  Pt and caregiver will be independent with HEP in order to demonstrate participation in Physical Therapy POC.  Baseline: Goal status: IN PROGRESS  2.  Pt will report > 25% improvement in subjective safety/balance concerns in order to demonstrate improved self awareness of balance deficits.  Baseline:  Goal status: IN PROGRESS  LONG TERM GOALS: Target date: 09/10/2023  Pt will increase 30 sec STS score by > 4 reps in order to demonstrate improved functional safety and balance skills in ADL/mobility.   Baseline: 10x Goal status: IN PROGRESS  2.  Pt will improve 2 MWT to >300 with LRAD in order to demonstrate improved functional mobility capacity in community setting.  Baseline: 185 ft with RW Goal status: IN PROGRESS  3.  Pt will improve ABC score by >15%  in order to demonstrate improved perceived balance with functional goals and outcomes. Baseline: See objective Goal status: IN PROGRESS  4.  Pt will report > 50% improvement in subjective safety/balance concerns in order to demonstrate improved self awareness of balance deficits..  Baseline: See objective Goal status: IN PROGRESS  ASSESSMENT:  CLINICAL IMPRESSION: Interventions today were geared towards LE strengthening. Tolerated all activities without worsening of symptoms. Demonstrated appropriate levels of fatigue. Provided mild amount of cueing to ensure correct execution of activity with fair to good carry-over especially with heel taps. To date, skilled PT is required to address the impairments and improve function.    Eval:  Patient is a 67y.o. male who was seen today for physical therapy evaluation and treatment for I63.9 (ICD-10-CM) -  Cerebrovascular accident (CVA), unspecified mechanism (HCC).  Pt currently is living with son and grandchildren, with intermittent assistance for ADLs.  Independent with rolling walker for ambulation household.  Supervision ambulation with assistive device and community setting.  Prior to aortic aneurysm repair onset of CVA, patient was having decline in BLE muscle strength due to unknown back reasons, that have gone undiagnosed due to medical issues.  Prior to these events, patient was independent with all aspects of living, avid golfer.. Based upon today's evaluation, pt is demonstrating functional mobility, ambulation, ADL impairments due to new onset of CVA, BLE muscle weakness, balance limitations. Pt would benefit from skilled physical therapy services to address the above impairments/limitations and improve overall functional mobility and quality of life..   OBJECTIVE IMPAIRMENTS: Abnormal gait, decreased balance, decreased mobility, decreased ROM, decreased strength, and hypomobility.   ACTIVITY LIMITATIONS: carrying, lifting, bending, sitting, squatting, and locomotion level  PARTICIPATION LIMITATIONS: driving, community activity, and yard work  PERSONAL FACTORS: Age are also affecting patient's functional outcome.   REHAB POTENTIAL: Excellent  CLINICAL DECISION MAKING: Evolving/moderate complexity  EVALUATION COMPLEXITY: Moderate  PLAN:  PT FREQUENCY: 2x/week  PT DURATION: 8 weeks  PLANNED INTERVENTIONS: Therapeutic exercises, Therapeutic activity, Neuromuscular re-education, Balance training, Gait training, Patient/Family education, Self Care, Joint mobilization, Stair training, Orthotic/Fit training, DME instructions, Manual therapy, and Re-evaluation  PLAN FOR NEXT SESSION: Continue POC and may progress as tolerated with emphasis on LE functional strengthening.  Tish Frederickson. Desiraye Rolfson, PT, DPT, OCS Board-Certified Clinical Specialist in Orthopedic PT PT Compact Privilege # (  [place]):  [phone] T 07/29/2023, 3:21 PM

## 2023-08-03 ENCOUNTER — Encounter (INDEPENDENT_AMBULATORY_CARE_PROVIDER_SITE_OTHER): Payer: Medicare Other | Admitting: *Deleted

## 2023-08-03 DIAGNOSIS — Z952 Presence of prosthetic heart valve: Secondary | ICD-10-CM

## 2023-08-03 DIAGNOSIS — I480 Paroxysmal atrial fibrillation: Secondary | ICD-10-CM | POA: Diagnosis not present

## 2023-08-03 DIAGNOSIS — Z5181 Encounter for therapeutic drug level monitoring: Secondary | ICD-10-CM | POA: Diagnosis not present

## 2023-08-03 DIAGNOSIS — I639 Cerebral infarction, unspecified: Secondary | ICD-10-CM | POA: Diagnosis not present

## 2023-08-03 LAB — POCT INR: POC INR: 2.1

## 2023-08-03 NOTE — Patient Instructions (Addendum)
Description   Continue warfarin to 1 tablet daily except 1 1/2 tablets on Tuesdays, Thursdays and Saturdays Recheck in 2 weeks

## 2023-08-04 ENCOUNTER — Ambulatory Visit (HOSPITAL_COMMUNITY): Payer: Medicare Other

## 2023-08-04 ENCOUNTER — Encounter (HOSPITAL_COMMUNITY): Payer: Self-pay | Admitting: Occupational Therapy

## 2023-08-04 ENCOUNTER — Ambulatory Visit (HOSPITAL_COMMUNITY): Payer: Medicare Other | Admitting: Occupational Therapy

## 2023-08-04 DIAGNOSIS — M6281 Muscle weakness (generalized): Secondary | ICD-10-CM

## 2023-08-04 DIAGNOSIS — R29818 Other symptoms and signs involving the nervous system: Secondary | ICD-10-CM

## 2023-08-04 DIAGNOSIS — Z7409 Other reduced mobility: Secondary | ICD-10-CM

## 2023-08-04 DIAGNOSIS — I639 Cerebral infarction, unspecified: Secondary | ICD-10-CM | POA: Diagnosis not present

## 2023-08-04 DIAGNOSIS — R278 Other lack of coordination: Secondary | ICD-10-CM

## 2023-08-04 NOTE — Therapy (Signed)
OUTPATIENT OCCUPATIONAL THERAPY NEURO TREATMENT  Patient Name: [name] MRN: [id number] DOB:Mar 21, 1956, 67 y.o., male Today's Date: 08/04/2023    END OF SESSION:  OT End of Session - 08/04/23 1424     Visit Number 5    Number of Visits 8    Date for OT Re-Evaluation 08/15/23    Authorization Type 1) Medicare A & B  2) [employer]    Progress Note Due on Visit 10    OT Start Time 1345    OT Stop Time 1430    OT Time Calculation (min) 45 min    Activity Tolerance Patient tolerated treatment well    Behavior During Therapy WFL for tasks assessed/performed               Past Medical History:  Diagnosis Date   Arthritis    Coronary atherosclerosis of native coronary artery    a. NSTEMI (02/2014):  LHC (02/2014):  Mild disease in LAD and CFX; prox RCA occluded with R-R collats, dist RCA filled by L-R collats, inf HK, EF 55%, LVEDP 15 mmHg.  PCI:  Unsuccessful angioplasty of RCA (late presentation of inf MI) - tx medically.   Essential hypertension    GERD (gastroesophageal reflux disease)    Hyperlipidemia    NSTEMI (non-ST elevated myocardial infarction) (HCC) 03/11/14   Renal cell carcinoma of right kidney (HCC)    Partial nephrectomy in 2013   Past Surgical History:  Procedure Laterality Date   AORTIC VALVE REPLACEMENT N/A 05/18/2023   Procedure: AORTIC VALVE REPLACEMENT (AVR) USING INSPIRIS RESILIA 23 MM AORTIC VALVE;  Surgeon: Alleen Borne, MD;  Location: [facility] OR;  Service: Open Heart Surgery;  Laterality: N/A;   BACK SURGERY     1989   CERVICAL DISC SURGERY  04/29/2021   COLONOSCOPY N/A 12/11/2020   Procedure: COLONOSCOPY;  Surgeon: Franky Macho, MD;  Location: [facility];  Service: Gastroenterology;  Laterality: N/A;   LEFT HEART CATHETERIZATION WITH CORONARY ANGIOGRAM N/A 03/17/2014   Procedure: LEFT HEART CATHETERIZATION WITH CORONARY ANGIOGRAM;  Surgeon: Corky Crafts, MD;  Location: [facility] CATH LAB;  Service: Cardiovascular;  Laterality: N/A;    POLYPECTOMY  12/11/2020   Procedure: POLYPECTOMY;  Surgeon: Franky Macho, MD;  Location: [facility];  Service: Gastroenterology;;   REPLACEMENT ASCENDING AORTA N/A 05/18/2023   Procedure: REPLACEMENT OF ASCENDING AORTIC ANEURYSM USING A 30 X 10 MM HEMASHIELD PLATINUM GRAFT;  Surgeon: Alleen Borne, MD;  Location: [facility] OR;  Service: Open Heart Surgery;  Laterality: N/A;  CIR[facility] ARREST   RIGHT HEART CATH AND CORONARY ANGIOGRAPHY N/A 04/03/2023   Procedure: RIGHT HEART CATH AND CORONARY ANGIOGRAPHY;  Surgeon: Tonny Bollman, MD;  Location: [facility] INVASIVE CV LAB;  Service: Cardiovascular;  Laterality: N/A;   ROBOT ASSISTED LAPAROSCOPIC NEPHRECTOMY  01/14/2012   Procedure: ROBOTIC ASSISTED LAPAROSCOPIC NEPHRECTOMY;  Surgeon: Milford Cage, MD;  Location: [facility] ORS;  Service: Urology;  Laterality: Right;  Robot Laparoscopic Right Partial Nephrectomy     TEE WITHOUT CARDIOVERSION N/A 05/18/2023   Procedure: TRANSESOPHAGEAL ECHOCARDIOGRAM;  Surgeon: Alleen Borne, MD;  Location: [facility] OR;  Service: Open Heart Surgery;  Laterality: N/A;   TONSILLECTOMY     Patient Active Problem List   Diagnosis Date Noted   Atrial fibrillation (HCC) 06/08/2023   Encounter for therapeutic drug monitoring 06/08/2023   CVA (cerebral vascular accident) (HCC) 05/25/2023   S/P AVR (aortic valve replacement) 05/18/2023   Chronic cough 02/20/2023   Myelopathy concurrent with and due to  spinal stenosis of cervical region [facility]) 02/15/2021   Right leg weakness 02/07/2021   Allodynia 02/07/2021   Gait disturbance 02/07/2021   White matter abnormality on MRI of brain 02/07/2021   Hyperreflexia 02/07/2021   Special screening for malignant neoplasms, colon    Adenomatous polyp of transverse colon    Diverticulosis of large intestine without diverticulitis    PSVT (paroxysmal supraventricular tachycardia) 05/29/2014   Precordial pain 04/19/2014   Coronary atherosclerosis of native coronary artery 04/06/2014   Essential  hypertension, benign 04/06/2014   HLD (hyperlipidemia) 04/06/2014   PCP: [name], PA-C REFERRING PROVIDER: Dr. Claudette Laws  ONSET DATE: 05/20/23  REFERRING DIAG: I63.9 (ICD-10-CM) - Cerebrovascular accident (CVA), unspecified mechanism (HCC)   THERAPY DIAG:  Other lack of coordination  Other symptoms and signs involving the nervous system  Rationale for Evaluation and Treatment: Rehabilitation  SUBJECTIVE:   SUBJECTIVE STATEMENT: S: "They're going ok"  PERTINENT HISTORY: Pt is a 67 year old male who presented to the [facility] for AVR and ascending aortic aneurysm repair on 05/18/2023. He had evidence of bicuspid aortic valve stenosis with 5.1 cm ascending aortic aneurysm and underwent repair by Dr. Laneta Simmers on 05/18/2023. He was extubated later that day. He had brief episode of hematuria after difficult Foley placement. Remains on Proscar for urinary retention. Intraoperative TEE with EF approximately 50%. Pacer wires and chest tubes removed on 7/03. Chest x-ray was stable and he was transferred out to stepdown unit. Acute blood loss anemia treated with oral iron replacement. Aspirin dosing decreased to 81 mg due to post-operative thrombocytopenia. Later in the evening on 7/03 he was noted to have right hand weakness and rapid response was called. He was noted to have atrial fibrillation with RVR and was hypotensive. He was started on an amiodarone infusion and cardiology consulted. He was transition to amiodarone 200 mg twice daily on 7/04. CT of head without evidence of acute infarct or hemorrhage, however brain MRI showed scattered small acute infarcts in the left cerebellar hemisphere in both cerebral hemispheres likely embolic in etiology.   PRECAUTIONS: Sternal and Fall  WEIGHT BEARING RESTRICTIONS: No  PAIN:  Are you having pain? No  FALLS: Has patient fallen in last 6 months? No  LIVING ENVIRONMENT: Lives with: lives with their family Lives in: House/apartment Stairs: Yes:  External: 2 steps; bilateral but cannot reach both Has following equipment at home: Single point cane and Walker - 2 wheeled  PLOF: Independent  PATIENT GOALS: To get stronger.   OBJECTIVE:   HAND DOMINANCE: Right  ADLs: Overall ADLs: Pt reports he requires increased time for ADLs. Is not driving yet. Pt reports meal preparation is difficult due to being fatigued. Picking up items is difficult, eating with right hand is difficult. Has difficulty with manipulating items.   ACTIVITY TOLERANCE: Activity tolerance: Limited due to recent heart sx  FUNCTIONAL OUTCOME MEASURES: FOTO: 63/100  UPPER EXTREMITY ROM:    BUE A/ROM is [facility]  UPPER EXTREMITY MMT:     MMT Right eval  Shoulder flexion 5/5  Shoulder abduction 5/5  Shoulder internal rotation 5/5  Shoulder external rotation 4+/5  Elbow flexion 5/5  Elbow extension 5/5  Wrist flexion 4/5  Wrist extension 5/5  Wrist ulnar deviation 4/5  Wrist radial deviation 4/5  Wrist pronation 5/5  Wrist supination 5/5  (Blank rows = not tested)  HAND FUNCTION: Grip strength: Right: 30 lbs; Left: 50 lbs, Lateral pinch: Right: 23 lbs, Left: 9 lbs, and 3 point pinch: Right: 12  lbs, Left: 3 lbs  COORDINATION: 9 Hole Peg test: Right: 1' 04 sec; Left: 31.42 sec  SENSATION: Tingling in hands/feet at baseline  MUSCLE TONE: RUE: Mild    TODAY'S TREATMENT:                                                                                                                              DATE:   08/04/23 -Theraband strengthening: green-protraction, flexion, horizontal abduction, er, abduction, 10 reps -Wrist strengthening: 3lb weight-flexion, extension, supination, pronation, ulnar/radial deviation, 12 reps -Sponges: 18, 20 -Hand gripper: large beads vertical at 25#, medium beads vertical at 25#, 7 small beads vertical at 25# and 22#-increased time -Coin manipulation: unable with coins or sequence chips -Red theraputty: flatten, cutting  circles with pvc pipe working on grip and wrist flexion/extension  07/29/23 -Therapy Ball Exercises: flexion, protraction, overhead press, V ups, circles both directions, x12 -Dribbling Basket Ball 2x10 -wrist strengthening: 3lb weight, flexion, extension, supination/pronation, ulnar/radial deviation, x12 -Grooved Peg Board -Sponges: 19, 17 -Thumb abduction/adduction around OT's finger  07/23/23 -Weightbearing: in standing, on flat hands, weight shifting x60", reaching forward with LUE x15 -Shoulder A/ROM: flexion, abduction, protraction, horizontal abduction, er/IR, x10 -Digit ROM: composite flexion, abduction/adduction, opposition, fingers taps, thumb abduction -Spinning cone with finger tips: x3 full spins -Pinch Strengthening: red, green, blue, black, resistance clips, tripod pinch up, lateral pinch down, x6 clips -stacking 6 cubes: first with fingers, then with green resistance clip -Finger Rubber Bands: yellow, thumb lifts, thumb presses, finger extensions, x10 each    PATIENT EDUCATION: Education details: practice squeezing coins Person educated: Patient Education method: Programmer, multimedia, Demonstration, and Handouts Education comprehension: verbalized understanding and returned demonstration  HOME EXERCISE PROGRAM: Eval: finger A/ROM 9/5: Finger Rubber Band Exercises   GOALS: Goals reviewed with patient? Yes  SHORT TERM GOALS: Target date: 08/15/23  Pt will be provided with and educated on HEP to improve mobility and functional use of right hand during ADLs.   Goal status: IN PROGRESS  2.  Pt will increase right grip strength by 20# and pinch strength by 4# or greater to improve ability to grasp and hold items for hygiene and meal preparation tasks.   Goal status: IN PROGRESS  3.  Pt will increase right fine motor coordination required for tying shoes or manipulating small objects by completing 9 hole peg test in 45" or less.   Goal status: IN PROGRESS  4.  Pt will be  educated on AE available to improve success and independence in ADL completion.   Goal status: IN PROGRESS    ASSESSMENT:  CLINICAL IMPRESSION: Pt reports he feels like the feeling is coming back into his hand. Pt was use the right hand exclusively to eat with. Began with proximal shoulder strengthening to address coordination, completing green theraband exercises. Resumed hand gripper activity, using gripper at 25# today and adding small beads. Attempted coin manipulation however pt unable to complete. Added theraputty work today for grip and  pinch. Verbal cuing for form and technique.   PERFORMANCE DEFICITS: in functional skills including ADLs, IADLs, coordination, proprioception, sensation, tone, strength, Fine motor control, and UE functional use    PLAN:  OT FREQUENCY: 2x/week  OT DURATION: 4 weeks  PLANNED INTERVENTIONS: therapeutic exercise, therapeutic activity, neuromuscular re-education, splinting, electrical stimulation, ultrasound, patient/family education, and DME and/or AE instructions, self-care  RECOMMENDED OTHER SERVICES: PT  CONSULTED AND AGREED WITH PLAN OF CARE: Patient  PLAN FOR NEXT SESSION: Follow up on HEP, grip and pinch strengthening, coordination tasks   Ezra Sites, OTR/L  [phone] 08/04/2023, 2:30 PM

## 2023-08-04 NOTE — Therapy (Signed)
OUTPATIENT PHYSICAL THERAPY NEURO TREATMENT   Patient Name: [name] MRN: [id number] DOB:1956-04-14, 67 y.o., male Today's Date: 08/04/2023   PCP: [facility] REFERRING PROVIDER: Erick Colace, MD END OF SESSION:   PT End of Session - 08/04/23 1321     Visit Number 6    Number of Visits 16    Date for PT Re-Evaluation 09/10/23    Authorization Type Medicare part A & B    Authorization Time Period no visit limit; no auth    Progress Note Due on Visit 10    PT Start Time 1300    PT Stop Time 1340    PT Time Calculation (min) 40 min    Activity Tolerance Patient tolerated treatment well    Behavior During Therapy WFL for tasks assessed/performed             Past Medical History:  Diagnosis Date   Arthritis    Coronary atherosclerosis of native coronary artery    a. NSTEMI (02/2014):  LHC (02/2014):  Mild disease in LAD and CFX; prox RCA occluded with R-R collats, dist RCA filled by L-R collats, inf HK, EF 55%, LVEDP 15 mmHg.  PCI:  Unsuccessful angioplasty of RCA (late presentation of inf MI) - tx medically.   Essential hypertension    GERD (gastroesophageal reflux disease)    Hyperlipidemia    NSTEMI (non-ST elevated myocardial infarction) (HCC) 03/11/14   Renal cell carcinoma of right kidney (HCC)    Partial nephrectomy in 2013   Past Surgical History:  Procedure Laterality Date   AORTIC VALVE REPLACEMENT N/A 05/18/2023   Procedure: AORTIC VALVE REPLACEMENT (AVR) USING INSPIRIS RESILIA 23 MM AORTIC VALVE;  Surgeon: Alleen Borne, MD;  Location: [facility] OR;  Service: Open Heart Surgery;  Laterality: N/A;   BACK SURGERY     1989   CERVICAL DISC SURGERY  04/29/2021   COLONOSCOPY N/A 12/11/2020   Procedure: COLONOSCOPY;  Surgeon: Franky Macho, MD;  Location: [facility] SUITE;  Service: Gastroenterology;  Laterality: N/A;   LEFT HEART CATHETERIZATION WITH CORONARY ANGIOGRAM N/A 03/17/2014   Procedure: LEFT HEART CATHETERIZATION WITH CORONARY ANGIOGRAM;   Surgeon: Corky Crafts, MD;  Location: [facility] CATH LAB;  Service: Cardiovascular;  Laterality: N/A;   POLYPECTOMY  12/11/2020   Procedure: POLYPECTOMY;  Surgeon: Franky Macho, MD;  Location: [facility] SUITE;  Service: Gastroenterology;;   REPLACEMENT ASCENDING AORTA N/A 05/18/2023   Procedure: REPLACEMENT OF ASCENDING AORTIC ANEURYSM USING A 30 X 10 MM HEMASHIELD PLATINUM GRAFT;  Surgeon: Alleen Borne, MD;  Location: [facility] OR;  Service: Open Heart Surgery;  Laterality: N/A;  CIRC ARREST   RIGHT HEART CATH AND CORONARY ANGIOGRAPHY N/A 04/03/2023   Procedure: RIGHT HEART CATH AND CORONARY ANGIOGRAPHY;  Surgeon: Tonny Bollman, MD;  Location: [facility] INVASIVE CV LAB;  Service: Cardiovascular;  Laterality: N/A;   ROBOT ASSISTED LAPAROSCOPIC NEPHRECTOMY  01/14/2012   Procedure: ROBOTIC ASSISTED LAPAROSCOPIC NEPHRECTOMY;  Surgeon: Milford Cage, MD;  Location: [facility] ORS;  Service: Urology;  Laterality: Right;  Robot Laparoscopic Right Partial Nephrectomy     TEE WITHOUT CARDIOVERSION N/A 05/18/2023   Procedure: TRANSESOPHAGEAL ECHOCARDIOGRAM;  Surgeon: Alleen Borne, MD;  Location: [facility] OR;  Service: Open Heart Surgery;  Laterality: N/A;   TONSILLECTOMY     Patient Active Problem List   Diagnosis Date Noted   Atrial fibrillation (HCC) 06/08/2023   Encounter for therapeutic drug monitoring 06/08/2023   CVA (cerebral vascular accident) (HCC) 05/25/2023   S/P AVR (  aortic valve replacement) 05/18/2023   Chronic cough 02/20/2023   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 [facility]) 02/15/2021   Right leg weakness 02/07/2021   Allodynia 02/07/2021   Gait disturbance 02/07/2021   White matter abnormality on MRI of brain 02/07/2021   Hyperreflexia 02/07/2021   Special screening for malignant neoplasms, colon    Adenomatous polyp of transverse colon    Diverticulosis of large intestine without diverticulitis    PSVT (paroxysmal supraventricular tachycardia) 05/29/2014    Precordial pain 04/19/2014   Coronary atherosclerosis of native coronary artery 04/06/2014   Essential hypertension, benign 04/06/2014   HLD (hyperlipidemia) 04/06/2014    ONSET DATE: 05/18/23  REFERRING DIAG: I63.9 (ICD-10-CM) - Cerebrovascular accident (CVA), unspecified mechanism (HCC)   THERAPY DIAG:  No diagnosis found.  Rationale for Evaluation and Treatment: Rehabilitation  SUBJECTIVE:                                                                                                                                                                                             SUBJECTIVE STATEMENT: Patient states that he's a little sore after the last session but is feeling better now. Denies any fals  Eval:  Pt went into open heart surgery on 7/1 and had small stroke after surgery on primary RUE. Pt has had leg tests over the past few years regarding nerve conduction and circulation issues. Extensive discussion regarding multiple medical issues surrounding BLE weakness hsitory prior to stroke. Was avid golfer. "Along as I got some hope, I with you all the way."  Pt accompanied by: self  PERTINENT HISTORY:  66 year old male who [facility] a ascending aortic aneurysm repair on 05/18/2023.  Patient with blood loss given treatment with iron replacement.  Later on 7 of 3/24 patient noted with right hand weakness.  Rapid response was called.  Patient was noted to be in atrial fibrillation with RVR and was hypotensive.  MRI showed scattered small acute infarction left cerebral hemisphere in both cerebral hemispheres.  C3 spinal surgery Open heart surgery on 05/18/23.  LBP and BLE weakness Dec'22 to current.   PAIN:  Are you having pain?  Pt reports chronic pain but nothing new.   PRECAUTIONS: Other: on sternal precautions for 12 weeks (till end of September 2024)  RED FLAGS: None   WEIGHT BEARING RESTRICTIONS: No  FALLS: Has patient fallen in last 6 months?  No  LIVING ENVIRONMENT: Lives with: lives with their family Lives in: House/apartment Stairs:  2 steps in/out, no concerns.  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 Pt had Dme avilable prior to stroke but was not using those  dependently, only when back pain was bad.   PATIENT GOALS: "golf might be a lofty goal"  OBJECTIVE:   DIAGNOSTIC FINDINGS:   IMPRESSION: Scattered small acute infarcts in the left cerebellar hemisphere and both cerebral hemispheres, likely embolic in etiology.  COGNITION: Overall cognitive status: Within functional limits for tasks assessed   SENSATION: WFL  COORDINATION: WFL  POSTURE: No Significant postural limitations  LOWER EXTREMITY ROM:     Active  Right Eval Left Eval  Hip flexion    Hip extension    Hip abduction    Hip adduction    Hip internal rotation    Hip external rotation    Knee flexion    Knee extension    Ankle dorsiflexion    Ankle plantarflexion    Ankle inversion    Ankle eversion     (Blank rows = not tested)  LOWER EXTREMITY MMT:    MMT Right Eval Left Eval Right 07/21/23 Left  07/21/23  Hip flexion   4/5 5/5  Hip extension   3+ Sidelying for sternal precaution 4-/5  Hip abduction   3+ 4/5  Hip adduction      Hip internal rotation      Hip external rotation      Knee flexion      Knee extension 3+ 3+    Ankle dorsiflexion 2+     Ankle plantarflexion      Ankle inversion      Ankle eversion      (Blank rows = not tested)     GAIT: Gait pattern: decreased step length- Right, decreased ankle dorsiflexion- Right, and Right foot flat Distance walked: 185 feet Assistive device utilized: Walker - 2 wheeled Level of assistance: SBA Comments: Patient ambulating with steady gait pattern without cueing, ambulating with right flatfoot and sliding through  RLE swing.  FUNCTIONAL TESTS:  30 seconds chair stand test:10x 2 minute walk test: 141ft  PATIENT SURVEYS:  ABC scale 45% impairment.   TODAY'S TREATMENT:                                                                                                                              DATE: 08/04/23 Ambulation with L quad cane x 250 ft with CGA-SBA Gastrocnemius slant board stretch x 30" x 3 Sit-to-stand x 10 x 2 Hip abd, GTB x 10 x 2 Hip ext, GTB x 10 x 2 Heel taps on cone x 2lbs x 10 x 2 on each Heel raises x 10 x 2 x 2 lbs Toe raises x 10 x 2 x 2 lbs High marches x 10 x 2 x 2 lbs   07/29/23 Gastrocnemius slant board stretch x 30" x 3 Sit-to-stand x 10 x 2 Hip abd, RTB x  10 x 2 Hip ext, RTB x 10 x 2 Mini squats x 3" x 10 x 2 Heel taps on floor marker x 10 x 2 on each Heel taps on cone x 10 x 2 on each Heel raises x 10 x 2 x 2 lbs High marches x 10 x 2 x 2 lbs  07/27/23 Heel raise x 10  Squat x 10  Marching x 10  Single leg stance x 3 one finger tip touch Sit to stand x 10 x 2  Side stepping with blue theraband x 2RT  Standing hip abduction with blue theraband  x 10 B  Standing hip extension with blue theraband x 10 B  Step up 4" step x 10 B  RT Knee flexion x 10   07/23/23 Educated on the importance of compliance to sternal precautions Seated hamstring stretch x 30" x 3 Standing:  Heel raises x 10 x 2  Toe raises x 10 x 2  Marches x 10 x 2  Mini squats x 3" x 10 x 2  07/21/23:  Reviewed goals Educated importance of HEP Compliance DGI with no AD, SBA with gait belt for safety MMT for hips Supine: Bridge Sidelying: Abduction Seated: STS eccentric control  - Toe raises  DGI complete with no AD, SBA for safety 1. Gait level surface (1) Moderate Impairment: Walks 20', slow speed, abnormal gait pattern, evidence for imbalance. 2. Change in gait speed (1) Moderate Impairment: Makes only minor adjustments to walking speed, or accomplishes a change in speed with  significant gait deviations, or changes speed but has significant gait deviations, or changes speed but loses balance but is able to recover and continue walking. 3. Gait with horizontal head turns (1) Moderate Impairment: Performs head turns with moderate change in gait velocity, slows down, staggers but recovers, can continue to walk. 4. Gait with vertical head turns (2) Mild Impairment: Performs head turns smoothly with slight change in gait velocity, i.e., minor disruption to smooth gait path or uses walking aid. 5. Gait and pivot turn (1) Moderate Impairment: Turns slowly, requires verbal cueing, requires several small steps to catch balance following turn and stop. 6. Step over obstacle (1) Moderate Impairment: Is able to step over box but must stop, then step over. May require verbal cueing. 7. Step around obstacles (2) Mild Impairment: Is able to step around both cones, but must slow down and adjust steps to clear cones. 8. Stairs (1) Moderate Impairment: Two feet to a stair, must use rail.  TOTAL SCORE: 10 / 24    PT evaluation, findings, prognosis, frequency, and palsy.   PATIENT EDUCATION: Education details: Education on use of quad cane (including measurement and placement)  Person educated: Patient Education method: Explanation Education comprehension: verbalized understanding  HOME EXERCISE PROGRAM: Access Code: [email or web address] URL: [email or web address] Date: 07/21/2023 Prepared by: Becky Sax  Exercises - Supine Bridge  - 2 x daily - 7 x weekly - 1 sets - 10 reps - 5" hold - Sidelying Hip Abduction  - 1 x daily - 7 x weekly - 3 sets - 10 reps - 3" hold - Sit to Stand Without Arm Support  - 3 x daily - 7 x weekly - 1 sets - 10 reps - Seated Toe Raise  - 3 x daily - 7 x weekly - 1 sets - 10 reps - 5" hold      07/27/23:              - Mini Squat with  Counter Support  - 2 x daily - 7 x weekly - 1 sets - 10 reps - 5" hold - Hip Extension with Resistance  Loop  - 1 x daily - 7 x weekly - 3 sets - 10 reps - Standing Hip Abduction with Resistance at Ankles and Counter Support  - 1 x daily - 7 x weekly - 3 sets - 10 reps - Standing Tandem Balance with Counter Support  - 1 x daily - 7 x weekly - 3 sets - 10 reps GOALS: Goals reviewed with patient? No  SHORT TERM GOALS: Target date: 08/13/2023  Pt and caregiver will be independent with HEP in order to demonstrate participation in Physical Therapy POC.  Baseline: Goal status: IN PROGRESS  2.  Pt will report > 25% improvement in subjective safety/balance concerns in order to demonstrate improved self awareness of balance deficits.  Baseline:  Goal status: IN PROGRESS  LONG TERM GOALS: Target date: 09/10/2023  Pt will increase 30 sec STS score by > 4 reps in order to demonstrate improved functional safety and balance skills in ADL/mobility.   Baseline: 10x Goal status: IN PROGRESS  2.  Pt will improve 2 MWT to >300 with LRAD in order to demonstrate improved functional mobility capacity in community setting.  Baseline: 185 ft with RW Goal status: IN PROGRESS  3.  Pt will improve ABC score by >15%  in order to demonstrate improved perceived balance with functional goals and outcomes. Baseline: See objective Goal status: IN PROGRESS  4.  Pt will report > 50% improvement in subjective safety/balance concerns in order to demonstrate improved self awareness of balance deficits..  Baseline: See objective Goal status: IN PROGRESS  ASSESSMENT:  CLINICAL IMPRESSION: Interventions today were geared towards LE strengthening and gait training. Little to no unsteadines noted on gait training. Demonstrated appropriate levels of fatigue. Provided mild amount of cueing to ensure correct execution of activity still with fair to good carry-over especially with heel taps. To date, skilled PT is required to address the impairments and improve function.    Eval:  Patient is a 67y.o. male who was seen today  for physical therapy evaluation and treatment for I63.9 (ICD-10-CM) - Cerebrovascular accident (CVA), unspecified mechanism (HCC).  Pt currently is living with son and grandchildren, with intermittent assistance for ADLs.  Independent with rolling walker for ambulation household.  Supervision ambulation with assistive device and community setting.  Prior to aortic aneurysm repair onset of CVA, patient was having decline in BLE muscle strength due to unknown back reasons, that have gone undiagnosed due to medical issues.  Prior to these events, patient was independent with all aspects of living, avid golfer.. Based upon today's evaluation, pt is demonstrating functional mobility, ambulation, ADL impairments due to new onset of CVA, BLE muscle weakness, balance limitations. Pt would benefit from skilled physical therapy services to address the above impairments/limitations and improve overall functional mobility and quality of life..   OBJECTIVE IMPAIRMENTS: Abnormal gait, decreased balance, decreased mobility, decreased ROM, decreased strength, and hypomobility.   ACTIVITY LIMITATIONS: carrying, lifting, bending, sitting, squatting, and locomotion level  PARTICIPATION LIMITATIONS: driving, community activity, and yard work  PERSONAL FACTORS: Age are also affecting patient's functional outcome.   REHAB POTENTIAL: Excellent  CLINICAL DECISION MAKING: Evolving/moderate complexity  EVALUATION COMPLEXITY: Moderate  PLAN:  PT FREQUENCY: 2x/week  PT DURATION: 8 weeks  PLANNED INTERVENTIONS: Therapeutic exercises, Therapeutic activity, Neuromuscular re-education, Balance training, Gait training, Patient/Family education, Self Care, Joint mobilization, Stair training, Orthotic/Fit training,  DME instructions, Manual therapy, and Re-evaluation  PLAN FOR NEXT SESSION: Continue POC and may progress as tolerated with emphasis on LE functional strengthening.  Tish Frederickson. Aylene Acoff, PT, DPT,  OCS Board-Certified Clinical Specialist in Orthopedic PT PT Compact Privilege # ([place]): [id number] T 08/04/2023, 1:01 PM

## 2023-08-06 ENCOUNTER — Encounter (HOSPITAL_COMMUNITY): Payer: Self-pay | Admitting: Occupational Therapy

## 2023-08-06 ENCOUNTER — Ambulatory Visit (HOSPITAL_COMMUNITY): Payer: Medicare Other | Admitting: Occupational Therapy

## 2023-08-06 ENCOUNTER — Encounter (HOSPITAL_COMMUNITY): Payer: Self-pay

## 2023-08-06 ENCOUNTER — Ambulatory Visit (HOSPITAL_COMMUNITY): Payer: Medicare Other

## 2023-08-06 DIAGNOSIS — Z7409 Other reduced mobility: Secondary | ICD-10-CM

## 2023-08-06 DIAGNOSIS — R29818 Other symptoms and signs involving the nervous system: Secondary | ICD-10-CM

## 2023-08-06 DIAGNOSIS — R278 Other lack of coordination: Secondary | ICD-10-CM

## 2023-08-06 DIAGNOSIS — I639 Cerebral infarction, unspecified: Secondary | ICD-10-CM | POA: Diagnosis not present

## 2023-08-06 DIAGNOSIS — M6281 Muscle weakness (generalized): Secondary | ICD-10-CM

## 2023-08-06 NOTE — Therapy (Signed)
OUTPATIENT PHYSICAL THERAPY NEURO TREATMENT   Patient Name: [name] MRN: [id number] DOB:November 06, 1956, 67 y.o., male Today's Date: 08/06/2023   PCP: [facility] REFERRING PROVIDER: Erick Colace, MD END OF SESSION:   PT End of Session - 08/06/23 1348     Visit Number 7    Number of Visits 16    Date for PT Re-Evaluation 09/10/23    Authorization Type Medicare part A & B    Authorization Time Period no visit limit; no auth    Progress Note Due on Visit 10    PT Start Time 1349    PT Stop Time 1430    PT Time Calculation (min) 41 min    Activity Tolerance Patient tolerated treatment well    Behavior During Therapy WFL for tasks assessed/performed             Past Medical History:  Diagnosis Date   Arthritis    Coronary atherosclerosis of native coronary artery    a. NSTEMI (02/2014):  LHC (02/2014):  Mild disease in LAD and CFX; prox RCA occluded with R-R collats, dist RCA filled by L-R collats, inf HK, EF 55%, LVEDP 15 mmHg.  PCI:  Unsuccessful angioplasty of RCA (late presentation of inf MI) - tx medically.   Essential hypertension    GERD (gastroesophageal reflux disease)    Hyperlipidemia    NSTEMI (non-ST elevated myocardial infarction) (HCC) 03/11/14   Renal cell carcinoma of right kidney (HCC)    Partial nephrectomy in 2013   Past Surgical History:  Procedure Laterality Date   AORTIC VALVE REPLACEMENT N/A 05/18/2023   Procedure: AORTIC VALVE REPLACEMENT (AVR) USING INSPIRIS RESILIA 23 MM AORTIC VALVE;  Surgeon: Alleen Borne, MD;  Location: [facility] OR;  Service: Open Heart Surgery;  Laterality: N/A;   BACK SURGERY     1989   CERVICAL DISC SURGERY  04/29/2021   COLONOSCOPY N/A 12/11/2020   Procedure: COLONOSCOPY;  Surgeon: Franky Macho, MD;  Location: [facility] SU[facility];  Service: Gastroenterology;  Laterality: N/A;   LEFT HEART CATHETERIZATION WITH CORONARY ANGIOGRAM N/A 03/17/2014   Procedure: LEFT HEART CATHETERIZATION WITH CORONARY ANGIOGRAM;   Surgeon: Corky Crafts, MD;  Location: [facility] CATH LAB;  Service: Cardiovascular;  Laterality: N/A;   POLYPECTOMY  12/11/2020   Procedure: POLYPECTOMY;  Surgeon: Franky Macho, MD;  Location: [facility];  Service: Gastroenterology;;   REPLACEMENT ASCENDING AORTA N/A 05/18/2023   Procedure: REPLACEMENT OF ASCENDING AORTIC ANEURYSM USING A 30 X 10 MM HEMASHIELD PLATINUM GRAFT;  Surgeon: Alleen Borne, MD;  Location: [facility] OR;  Service: Open Heart Surgery;  Laterality: N/A;  CIRC ARREST   RIGHT HEART CATH AND CORONARY ANGIOGRAPHY N/A 04/03/2023   Procedure: RIGHT HEART CATH AND CORONARY ANGIOGRAPHY;  Surgeon: Tonny Bollman, MD;  Location: [facility] INVASIVE CV LAB;  Service: Cardiovascular;  Laterality: N/A;   ROBOT ASSISTED LAPAROSCOPIC NEPHRECTOMY  01/14/2012   Procedure: ROBOTIC ASSISTED LAPAROSCOPIC NEPHRECTOMY;  Surgeon: Milford Cage, MD;  Location: [facility] ORS;  Service: Urology;  Laterality: Right;  Robot Laparoscopic Right Partial Nephrectomy     TEE WITHOUT CARDIOVERSION N/A 05/18/2023   Procedure: TRANSESOPHAGEAL ECHOCARDIOGRAM;  Surgeon: Alleen Borne, MD;  Location: [facility] OR;  Service: Open Heart Surgery;  Laterality: N/A;   TONSILLECTOMY     Patient Active Problem List   Diagnosis Date Noted   Atrial fibrillation (HCC) 06/08/2023   Encounter for therapeutic drug monitoring 06/08/2023   CVA (cerebral vascular accident) (HCC) 05/25/2023   S/P AVR (  aortic valve replacement) 05/18/2023   Chronic cough 02/20/2023   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 [facility]) 02/15/2021   Right leg weakness 02/07/2021   Allodynia 02/07/2021   Gait disturbance 02/07/2021   White matter abnormality on MRI of brain 02/07/2021   Hyperreflexia 02/07/2021   Special screening for malignant neoplasms, colon    Adenomatous polyp of transverse colon    Diverticulosis of large intestine without diverticulitis    PSVT (paroxysmal supraventricular tachycardia) 05/29/2014    Precordial pain 04/19/2014   Coronary atherosclerosis of native coronary artery 04/06/2014   Essential hypertension, benign 04/06/2014   HLD (hyperlipidemia) 04/06/2014    ONSET DATE: 05/18/23  REFERRING DIAG: I63.9 (ICD-10-CM) - Cerebrovascular accident (CVA), unspecified mechanism (HCC)   THERAPY DIAG:  Muscle weakness (generalized)  Impaired functional mobility, balance, gait, and endurance  Rationale for Evaluation and Treatment: Rehabilitation  SUBJECTIVE:                                                                                                                                                                                             SUBJECTIVE STATEMENT: Pt stated he feels his legs are still weaks, isn't seeing as many improvements with BLE as he is Rt UE.  Does have some chest pain, pain scale 2-3/10.  Reports he walks from one home to the other with SPC occasionally, uses RW when feels more unsteady.  No reports of recent falls.  Eval:  Pt went into open heart surgery on 7/1 and had small stroke after surgery on primary RUE. Pt has had leg tests over the past few years regarding nerve conduction and circulation issues. Extensive discussion regarding multiple medical issues surrounding BLE weakness hsitory prior to stroke. Was avid golfer. "Along as I got some hope, I with you all the way."  Pt accompanied by: self  PERTINENT HISTORY:  67 year old male who [facility] a ascending aortic aneurysm repair on 05/18/2023.  Patient with blood loss given treatment with iron replacement.  Later on 7 of 3/24 patient noted with right hand weakness.  Rapid response was called.  Patient was noted to be in atrial fibrillation with RVR and was hypotensive.  MRI showed scattered small acute infarction left cerebral hemisphere in both cerebral hemispheres.  C3 spinal surgery Open heart surgery on 05/18/23.  LBP and BLE weakness Dec'22 to current.   PAIN:  Are you  having pain?  Pt reports chronic pain but nothing new.   PRECAUTIONS: Other: on sternal precautions for 12 weeks (till end of September 2024)  RED FLAGS: None   WEIGHT  BEARING RESTRICTIONS: No  FALLS: Has patient fallen in last 6 months? No  LIVING ENVIRONMENT: Lives with: lives with their family Lives in: House/apartment Stairs:  2 steps in/out, no concerns.  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 Pt had Dme avilable prior to stroke but was not using those  dependently, only when back pain was bad.   PATIENT GOALS: "golf might be a lofty goal"  OBJECTIVE:   DIAGNOSTIC FINDINGS:   IMPRESSION: Scattered small acute infarcts in the left cerebellar hemisphere and both cerebral hemispheres, likely embolic in etiology.  COGNITION: Overall cognitive status: Within functional limits for tasks assessed   SENSATION: WFL  COORDINATION: WFL  POSTURE: No Significant postural limitations  LOWER EXTREMITY ROM:     Active  Right Eval Left Eval  Hip flexion    Hip extension    Hip abduction    Hip adduction    Hip internal rotation    Hip external rotation    Knee flexion    Knee extension    Ankle dorsiflexion    Ankle plantarflexion    Ankle inversion    Ankle eversion     (Blank rows = not tested)  LOWER EXTREMITY MMT:    MMT Right Eval Left Eval Right 07/21/23 Left  07/21/23  Hip flexion   4/5 5/5  Hip extension   3+ Sidelying for sternal precaution 4-/5  Hip abduction   3+ 4/5  Hip adduction      Hip internal rotation      Hip external rotation      Knee flexion      Knee extension 3+ 3+    Ankle dorsiflexion 2+     Ankle plantarflexion      Ankle inversion      Ankle eversion      (Blank rows = not tested)     GAIT: Gait pattern: decreased step length- Right, decreased ankle dorsiflexion-  Right, and Right foot flat Distance walked: 185 feet Assistive device utilized: Walker - 2 wheeled Level of assistance: SBA Comments: Patient ambulating with steady gait pattern without cueing, ambulating with right flatfoot and sliding through RLE swing.  FUNCTIONAL TESTS:  30 seconds chair stand test:10x 2 minute walk test: 119ft  PATIENT SURVEYS:  ABC scale 45% impairment.   TODAY'S TREATMENT:                                                                                                                              DATE: 08/06/23:   Gait training with SPC x 226 CGA STS 10x eccentric control no HHA Gait training:    -Heel strike  -stance phase  -knee flexion  -swing phase RW 269ft cueing for knee flexion Heel tap on 3 cones (therapist chose LE and color x1, then 2 and 3  taps before), no HHA with Lt LE, UE support required with Rt LE stance) Squats 2x 10 front of chair Tandem stance 1x 30" on floor, 2x 30" on foam    08/04/23 Ambulation with L quad cane x 250 ft with CGA-SBA Gastrocnemius slant board stretch x 30" x 3 Sit-to-stand x 10 x 2 Hip abd, GTB x 10 x 2 Hip ext, GTB x 10 x 2 Heel taps on cone x 2lbs x 10 x 2 on each Heel raises x 10 x 2 x 2 lbs Toe raises x 10 x 2 x 2 lbs High marches x 10 x 2 x 2 lbs   07/29/23 Gastrocnemius slant board stretch x 30" x 3 Sit-to-stand x 10 x 2 Hip abd, RTB x 10 x 2 Hip ext, RTB x 10 x 2 Mini squats x 3" x 10 x 2 Heel taps on floor marker x 10 x 2 on each Heel taps on cone x 10 x 2 on each Heel raises x 10 x 2 x 2 lbs High marches x 10 x 2 x 2 lbs  07/27/23 Heel raise x 10  Squat x 10  Marching x 10  Single leg stance x 3 one finger tip touch Sit to stand x 10 x 2  Side stepping with blue theraband x 2RT  Standing hip abduction with blue theraband  x 10 B  Standing hip extension with blue theraband x 10 B  Step up 4" step x 10 B  RT Knee flexion x 10   07/23/23 Educated on the importance of compliance to sternal  precautions Seated hamstring stretch x 30" x 3 Standing:  Heel raises x 10 x 2  Toe raises x 10 x 2  Marches x 10 x 2  Mini squats x 3" x 10 x 2  07/21/23:  Reviewed goals Educated importance of HEP Compliance DGI with no AD, SBA with gait belt for safety MMT for hips Supine: Bridge Sidelying: Abduction Seated: STS eccentric control  - Toe raises  DGI complete with no AD, SBA for safety 1. Gait level surface (1) Moderate Impairment: Walks 20', slow speed, abnormal gait pattern, evidence for imbalance. 2. Change in gait speed (1) Moderate Impairment: Makes only minor adjustments to walking speed, or accomplishes a change in speed with significant gait deviations, or changes speed but has significant gait deviations, or changes speed but loses balance but is able to recover and continue walking. 3. Gait with horizontal head turns (1) Moderate Impairment: Performs head turns with moderate change in gait velocity, slows down, staggers but recovers, can continue to walk. 4. Gait with vertical head turns (2) Mild Impairment: Performs head turns smoothly with slight change in gait velocity, i.e., minor disruption to smooth gait path or uses walking aid. 5. Gait and pivot turn (1) Moderate Impairment: Turns slowly, requires verbal cueing, requires several small steps to catch balance following turn and stop. 6. Step over obstacle (1) Moderate Impairment: Is able to step over box but must stop, then step over. May require verbal cueing. 7. Step around obstacles (2) Mild Impairment: Is able to step around both cones, but must slow down and adjust steps to clear cones. 8. Stairs (1) Moderate Impairment: Two feet to a stair, must use rail.  TOTAL SCORE: 10 / 24    PT evaluation, findings, prognosis, frequency, and palsy.   PATIENT EDUCATION: Education details: Education on use of quad cane (including measurement and placement)  Person educated: Patient Education method:  Explanation  Education comprehension: verbalized understanding  HOME EXERCISE PROGRAM: Access Code: [email or web address] URL: [email or web address] Date: 07/21/2023 Prepared by: Becky Sax  Exercises - Supine Bridge  - 2 x daily - 7 x weekly - 1 sets - 10 reps - 5" hold - Sidelying Hip Abduction  - 1 x daily - 7 x weekly - 3 sets - 10 reps - 3" hold - Sit to Stand Without Arm Support  - 3 x daily - 7 x weekly - 1 sets - 10 reps - Seated Toe Raise  - 3 x daily - 7 x weekly - 1 sets - 10 reps - 5" hold      07/27/23:              - Mini Squat with Counter Support  - 2 x daily - 7 x weekly - 1 sets - 10 reps - 5" hold - Hip Extension with Resistance Loop  - 1 x daily - 7 x weekly - 3 sets - 10 reps - Standing Hip Abduction with Resistance at Ankles and Counter Support  - 1 x daily - 7 x weekly - 3 sets - 10 reps - Standing Tandem Balance with Counter Support  - 1 x daily - 7 x weekly - 3 sets - 10 reps GOALS: Goals reviewed with patient? No  SHORT TERM GOALS: Target date: 08/13/2023  Pt and caregiver will be independent with HEP in order to demonstrate participation in Physical Therapy POC.  Baseline: Goal status: IN PROGRESS  2.  Pt will report > 25% improvement in subjective safety/balance concerns in order to demonstrate improved self awareness of balance deficits.  Baseline:  Goal status: IN PROGRESS  LONG TERM GOALS: Target date: 09/10/2023  Pt will increase 30 sec STS score by > 4 reps in order to demonstrate improved functional safety and balance skills in ADL/mobility.   Baseline: 10x Goal status: IN PROGRESS  2.  Pt will improve 2 MWT to >300 with LRAD in order to demonstrate improved functional mobility capacity in community setting.  Baseline: 185 ft with RW Goal status: IN PROGRESS  3.  Pt will improve ABC score by >15%  in order to demonstrate improved perceived balance with functional goals and outcomes. Baseline: See objective Goal status: IN PROGRESS  4.   Pt will report > 50% improvement in subjective safety/balance concerns in order to demonstrate improved self awareness of balance deficits..  Baseline: See objective Goal status: IN PROGRESS  ASSESSMENT:  CLINICAL IMPRESSION: Session focus on improving gait mechanics with LRAD.  Pt able to ambulate with SPC with no LOB episodes and good sequence.  Pt presents with no Rt knee flexion during terminal stance phase.  Educated different stages of gait with improved mechanics following, encouraged to focus on this regular basis.  Balance activities complete with focus on SLS, difficulty with Rt LE WB, required HHA.  Improved stability noted with tandem stance, progressed to foam with intermittent HHA.  Pt tolerated well to session with no reports of increased pain.     Eval:  Patient is a 67y.o. male who was seen today for physical therapy evaluation and treatment for I63.9 (ICD-10-CM) - Cerebrovascular accident (CVA), unspecified mechanism (HCC).  Pt currently is living with son and grandchildren, with intermittent assistance for ADLs.  Independent with rolling walker for ambulation household.  Supervision ambulation with assistive device and community setting.  Prior to aortic aneurysm repair onset of CVA, patient was having decline in BLE muscle strength due to unknown back  reasons, that have gone undiagnosed due to medical issues.  Prior to these events, patient was independent with all aspects of living, avid golfer.. Based upon today's evaluation, pt is demonstrating functional mobility, ambulation, ADL impairments due to new onset of CVA, BLE muscle weakness, balance limitations. Pt would benefit from skilled physical therapy services to address the above impairments/limitations and improve overall functional mobility and quality of life..   OBJECTIVE IMPAIRMENTS: Abnormal gait, decreased balance, decreased mobility, decreased ROM, decreased strength, and hypomobility.   ACTIVITY LIMITATIONS:  carrying, lifting, bending, sitting, squatting, and locomotion level  PARTICIPATION LIMITATIONS: driving, community activity, and yard work  PERSONAL FACTORS: Age are also affecting patient's functional outcome.   REHAB POTENTIAL: Excellent  CLINICAL DECISION MAKING: Evolving/moderate complexity  EVALUATION COMPLEXITY: Moderate  PLAN:  PT FREQUENCY: 2x/week  PT DURATION: 8 weeks  PLANNED INTERVENTIONS: Therapeutic exercises, Therapeutic activity, Neuromuscular re-education, Balance training, Gait training, Patient/Family education, Self Care, Joint mobilization, Stair training, Orthotic/Fit training, DME instructions, Manual therapy, and Re-evaluation  PLAN FOR NEXT SESSION: Continue POC and may progress as tolerated with emphasis on LE functional strengthening.  Add hamstring strengthening next session.  Becky Sax, LPTA/CLT; CBIS [phone]  Juel Burrow, PTA 08/06/2023, 2:55 PM  08/06/2023, 2:55 PM

## 2023-08-06 NOTE — Therapy (Signed)
OUTPATIENT OCCUPATIONAL THERAPY NEURO TREATMENT  Patient Name: [name] MRN: [id number] DOB:07-10-1956, 67 y.o., male Today's Date: 08/07/2023    END OF SESSION:  OT End of Session - 08/07/23 1357     Visit Number 6    Number of Visits 8    Date for OT Re-Evaluation 08/15/23    Authorization Type 1) Medicare A & B  2) [employer]    Progress Note Due on Visit 10    OT Start Time 1304    OT Stop Time 1345    OT Time Calculation (min) 41 min    Activity Tolerance Patient tolerated treatment well    Behavior During Therapy WFL for tasks assessed/performed             Past Medical History:  Diagnosis Date   Arthritis    Coronary atherosclerosis of native coronary artery    a. NSTEMI (02/2014):  LHC (02/2014):  Mild disease in LAD and CFX; prox RCA occluded with R-R collats, dist RCA filled by L-R collats, inf HK, EF 55%, LVEDP 15 mmHg.  PCI:  Unsuccessful angioplasty of RCA (late presentation of inf MI) - tx medically.   Essential hypertension    GERD (gastroesophageal reflux disease)    Hyperlipidemia    NSTEMI (non-ST elevated myocardial infarction) (HCC) 03/11/14   Renal cell carcinoma of right kidney (HCC)    Partial nephrectomy in 2013   Past Surgical History:  Procedure Laterality Date   AORTIC VALVE REPLACEMENT N/A 05/18/2023   Procedure: AORTIC VALVE REPLACEMENT (AVR) USING INSPIRIS RESILIA 23 MM AORTIC VALVE;  Surgeon: Alleen Borne, MD;  Location: [facility] OR;  Service: Open Heart Surgery;  Laterality: N/A;   BACK SURGERY     1989   CERVICAL DISC SURGERY  04/29/2021   COLONOSCOPY N/A 12/11/2020   Procedure: COLONOSCOPY;  Surgeon: Franky Macho, MD;  Location: [facility];  Service: Gastroenterology;  Laterality: N/A;   LEFT HEART CATHETERIZATION WITH CORONARY ANGIOGRAM N/A 03/17/2014   Procedure: LEFT HEART CATHETERIZATION WITH CORONARY ANGIOGRAM;  Surgeon: Corky Crafts, MD;  Location: [facility] CATH LAB;  Service: Cardiovascular;  Laterality: N/A;   POLYPECTOMY   12/11/2020   Procedure: POLYPECTOMY;  Surgeon: Franky Macho, MD;  Location: [facility];  Service: Gastroenterology;;   REPLACEMENT ASCENDING AORTA N/A 05/18/2023   Procedure: REPLACEMENT OF ASCENDING AORTIC ANEURYSM USING A 30 X 10 MM HEMASHIELD PLATINUM GRAFT;  Surgeon: Alleen Borne, MD;  Location: [facility] OR;  Service: Open Heart Surgery;  Laterality: N/A;  CIRC ARREST   RIGHT HEART CATH AND CORONARY ANGIOGRAPHY N/A 04/03/2023   Procedure: RIGHT HEART CATH AND CORONARY ANGIOGRAPHY;  Surgeon: Tonny Bollman, MD;  Location: [facility] INVASIVE CV LAB;  Service: Cardiovascular;  Laterality: N/A;   ROBOT ASSISTED LAPAROSCOPIC NEPHRECTOMY  01/14/2012   Procedure: ROBOTIC ASSISTED LAPAROSCOPIC NEPHRECTOMY;  Surgeon: Milford Cage, MD;  Location: [facility] ORS;  Service: Urology;  Laterality: Right;  Robot Laparoscopic Right Partial Nephrectomy     TEE WITHOUT CARDIOVERSION N/A 05/18/2023   Procedure: TRANSESOPHAGEAL ECHOCARDIOGRAM;  Surgeon: Alleen Borne, MD;  Location: [facility] OR;  Service: Open Heart Surgery;  Laterality: N/A;   TONSILLECTOMY     Patient Active Problem List   Diagnosis Date Noted   Atrial fibrillation (HCC) 06/08/2023   Encounter for therapeutic drug monitoring 06/08/2023   CVA (cerebral vascular accident) (HCC) 05/25/2023   S/P AVR (aortic valve replacement) 05/18/2023   Chronic cough 02/20/2023   Myelopathy concurrent with and due to spinal stenosis  of cervical region [facility]) 02/15/2021   Right leg weakness 02/07/2021   Allodynia 02/07/2021   Gait disturbance 02/07/2021   White matter abnormality on MRI of brain 02/07/2021   Hyperreflexia 02/07/2021   Special screening for malignant neoplasms, colon    Adenomatous polyp of transverse colon    Diverticulosis of large intestine without diverticulitis    PSVT (paroxysmal supraventricular tachycardia) 05/29/2014   Precordial pain 04/19/2014   Coronary atherosclerosis of native coronary artery 04/06/2014   Essential hypertension,  benign 04/06/2014   HLD (hyperlipidemia) 04/06/2014   PCP: [name], PA-C REFERRING PROVIDER: Dr. Claudette Laws  ONSET DATE: 05/20/23  REFERRING DIAG: I63.9 (ICD-10-CM) - Cerebrovascular accident (CVA), unspecified mechanism (HCC)   THERAPY DIAG:  Other lack of coordination  Other symptoms and signs involving the nervous system  Rationale for Evaluation and Treatment: Rehabilitation  SUBJECTIVE:   SUBJECTIVE STATEMENT: S: "I can tell this stuff is helping"  PERTINENT HISTORY: Pt is a 67 year old male who presented to the [facility] for AVR and ascending aortic aneurysm repair on 05/18/2023. He had evidence of bicuspid aortic valve stenosis with 5.1 cm ascending aortic aneurysm and underwent repair by Dr. Laneta Simmers on 05/18/2023. He was extubated later that day. He had brief episode of hematuria after difficult Foley placement. Remains on Proscar for urinary retention. Intraoperative TEE with EF approximately 50%. Pacer wires and chest tubes removed on 7/03. Chest x-ray was stable and he was transferred out to stepdown unit. Acute blood loss anemia treated with oral iron replacement. Aspirin dosing decreased to 81 mg due to post-operative thrombocytopenia. Later in the evening on 7/03 he was noted to have right hand weakness and rapid response was called. He was noted to have atrial fibrillation with RVR and was hypotensive. He was started on an amiodarone infusion and cardiology consulted. He was transition to amiodarone 200 mg twice daily on 7/04. CT of head without evidence of acute infarct or hemorrhage, however brain MRI showed scattered small acute infarcts in the left cerebellar hemisphere in both cerebral hemispheres likely embolic in etiology.   PRECAUTIONS: Sternal and Fall  WEIGHT BEARING RESTRICTIONS: No  PAIN:  Are you having pain? No  FALLS: Has patient fallen in last 6 months? No  LIVING ENVIRONMENT: Lives with: lives with their family Lives in: House/apartment Stairs:  Yes: External: 2 steps; bilateral but cannot reach both Has following equipment at home: Single point cane and Walker - 2 wheeled  PLOF: Independent  PATIENT GOALS: To get stronger.   OBJECTIVE:   HAND DOMINANCE: Right  ADLs: Overall ADLs: Pt reports he requires increased time for ADLs. Is not driving yet. Pt reports meal preparation is difficult due to being fatigued. Picking up items is difficult, eating with right hand is difficult. Has difficulty with manipulating items.   ACTIVITY TOLERANCE: Activity tolerance: Limited due to recent heart sx  FUNCTIONAL OUTCOME MEASURES: FOTO: 63/100  UPPER EXTREMITY ROM:    BUE A/ROM is [facility]  UPPER EXTREMITY MMT:     MMT Right eval  Shoulder flexion 5/5  Shoulder abduction 5/5  Shoulder internal rotation 5/5  Shoulder external rotation 4+/5  Elbow flexion 5/5  Elbow extension 5/5  Wrist flexion 4/5  Wrist extension 5/5  Wrist ulnar deviation 4/5  Wrist radial deviation 4/5  Wrist pronation 5/5  Wrist supination 5/5  (Blank rows = not tested)  HAND FUNCTION: Grip strength: Right: 30 lbs; Left: 50 lbs, Lateral pinch: Right: 23 lbs, Left: 9 lbs, and 3 point pinch:  Right: 12 lbs, Left: 3 lbs  COORDINATION: 9 Hole Peg test: Right: 1' 04 sec; Left: 31.42 sec  SENSATION: Tingling in hands/feet at baseline  MUSCLE TONE: RUE: Mild    TODAY'S TREATMENT:                                                                                                                              DATE:   08/06/23 -Shoulder Strengthening: 4lb dumbbell, flexion, abduction, protraction, horizontal abduction, er/IR, x12 -Tiny Peg board, using RUE only, placing 28 tiny pegs following a pattern -Proximal Shoulder Exercises: 3lb dumbbell, paddles, criss cross, circles both directions, 2x10 each -Digiflex: 7lbs, full squeeze 2x15, each digit x8 -Connect 4, holding 4-5 checkers at a time and placing them 1 by 1 in the board, x15  checkers  08/04/23 -Theraband strengthening: green-protraction, flexion, horizontal abduction, er, abduction, 10 reps -Wrist strengthening: 3lb weight-flexion, extension, supination, pronation, ulnar/radial deviation, 12 reps -Sponges: 18, 20 -Hand gripper: large beads vertical at 25#, medium beads vertical at 25#, 7 small beads vertical at 25# and 22#-increased time -Coin manipulation: unable with coins or sequence chips -Red theraputty: flatten, cutting circles with pvc pipe working on grip and wrist flexion/extension  07/29/23 -Therapy Ball Exercises: flexion, protraction, overhead press, V ups, circles both directions, x12 -Dribbling Basket Ball 2x10 -wrist strengthening: 3lb weight, flexion, extension, supination/pronation, ulnar/radial deviation, x12 -Grooved Peg Board -Sponges: 19, 17 -Thumb abduction/adduction around OT's finger   PATIENT EDUCATION: Education details: Shoulder strengthening with dumbbells Person educated: Patient Education method: Explanation, Demonstration, and Handouts Education comprehension: verbalized understanding and returned demonstration  HOME EXERCISE PROGRAM: Eval: finger A/ROM 9/5: Finger Rubber Band Exercises 9/20: shoulder strengthening with dumbbells   GOALS: Goals reviewed with patient? Yes  SHORT TERM GOALS: Target date: 08/15/23  Pt will be provided with and educated on HEP to improve mobility and functional use of right hand during ADLs.   Goal status: IN PROGRESS  2.  Pt will increase right grip strength by 20# and pinch strength by 4# or greater to improve ability to grasp and hold items for hygiene and meal preparation tasks.   Goal status: IN PROGRESS  3.  Pt will increase right fine motor coordination required for tying shoes or manipulating small objects by completing 9 hole peg test in 45" or less.   Goal status: IN PROGRESS  4.  Pt will be educated on AE available to improve success and independence in ADL completion.    Goal status: IN PROGRESS    ASSESSMENT:  CLINICAL IMPRESSION: This session pt continuing to work on overall mobility and strengthen, as well as fine motor coordination. He continues to demonstrate good endurance with strength based tasks, not requiring any rest breaks. Pt continuing to have significant difficulties with his coordination, however with practice and increased time this session he was able to start using all of his fingers to manipulate the small pegs and checkers to allow for a more functional  and efficient utilization during the tasks. OT providing verbal and tactile cuing throughout session for postioning and technique.   PERFORMANCE DEFICITS: in functional skills including ADLs, IADLs, coordination, proprioception, sensation, tone, strength, Fine motor control, and UE functional use    PLAN:  OT FREQUENCY: 2x/week  OT DURATION: 4 weeks  PLANNED INTERVENTIONS: therapeutic exercise, therapeutic activity, neuromuscular re-education, splinting, electrical stimulation, ultrasound, patient/family education, and DME and/or AE instructions, self-care  RECOMMENDED OTHER SERVICES: PT  CONSULTED AND AGREED WITH PLAN OF CARE: Patient  PLAN FOR NEXT SESSION: Follow up on HEP, grip and pinch strengthening, coordination tasks   Trish Mage, OTR/L [phone] 08/07/2023, 2:00 PM

## 2023-08-07 ENCOUNTER — Other Ambulatory Visit: Payer: Self-pay | Admitting: *Deleted

## 2023-08-07 MED ORDER — METOPROLOL TARTRATE 25 MG PO TABS: 25.0000 mg | ORAL_TABLET | Freq: Two times a day (BID) | ORAL | 1 refills | Status: DC

## 2023-08-11 ENCOUNTER — Ambulatory Visit (HOSPITAL_COMMUNITY): Payer: Medicare Other | Admitting: Occupational Therapy

## 2023-08-11 ENCOUNTER — Encounter (HOSPITAL_COMMUNITY): Payer: Self-pay | Admitting: Occupational Therapy

## 2023-08-11 ENCOUNTER — Ambulatory Visit (HOSPITAL_COMMUNITY): Payer: Medicare Other

## 2023-08-11 DIAGNOSIS — R278 Other lack of coordination: Secondary | ICD-10-CM

## 2023-08-11 DIAGNOSIS — R29818 Other symptoms and signs involving the nervous system: Secondary | ICD-10-CM

## 2023-08-11 DIAGNOSIS — I639 Cerebral infarction, unspecified: Secondary | ICD-10-CM | POA: Diagnosis not present

## 2023-08-11 NOTE — Therapy (Unsigned)
OUTPATIENT OCCUPATIONAL THERAPY NEURO TREATMENT  Patient Name: [name] MRN: [id number] DOB:[birth year], 67 y.o., male Today's Date: 08/12/2023    END OF SESSION:  OT End of Session - 08/11/23 1435     Visit Number 7    Number of Visits 8    Date for OT Re-Evaluation 08/15/23    Authorization Type 1) Medicare A & B  2) [employer]    Progress Note Due on Visit 10    OT Start Time 1347    OT Stop Time 1434    OT Time Calculation (min) 47 min    Activity Tolerance Patient tolerated treatment well    Behavior During Therapy WFL for tasks assessed/performed             Past Medical History:  Diagnosis Date   Arthritis    Coronary atherosclerosis of native coronary artery    a. NSTEMI (02/2014):  LHC (02/2014):  Mild disease in LAD and CFX; prox RCA occluded with R-R collats, dist RCA filled by L-R collats, inf HK, EF 55%, LVEDP 15 mmHg.  PCI:  Unsuccessful angioplasty of RCA (late presentation of inf MI) - tx medically.   Essential hypertension    GERD (gastroesophageal reflux disease)    Hyperlipidemia    NSTEMI (non-ST elevated myocardial infarction) (HCC) 03/11/14   Renal cell carcinoma of right kidney (HCC)    Partial nephrectomy in 2013   Past Surgical History:  Procedure Laterality Date   AORTIC VALVE REPLACEMENT N/A 05/18/2023   Procedure: AORTIC VALVE REPLACEMENT (AVR) USING INSPIRIS RESILIA 23 MM AORTIC VALVE;  Surgeon: Alleen Borne, MD;  Location: [facility] OR;  Service: Open Heart Surgery;  Laterality: N/A;   BACK SURGERY     1989   CERVICAL DISC SURGERY  04/29/2021   COLONOSCOPY N/A 12/11/2020   Procedure: COLONOSCOPY;  Surgeon: Franky Macho, MD;  [facility]: [facility];  Service: Gastroenterology;  Laterality: N/A;   LEFT HEART CATHETERIZATION WITH CORONARY ANGIOGRAM N/A 03/17/2014   Procedure: LEFT HEART CATHETERIZATION WITH CORONARY ANGIOGRAM;  Surgeon: Corky Crafts, MD;  Location: [facility] CATH LAB;  Service: Cardiovascular;  Laterality: N/A;   POLYPECTOMY   12/11/2020   Procedure: POLYPECTOMY;  Surgeon: Franky Macho, MD;  [facility];  Service: Gastroenterology;;   REPLACEMENT ASCENDING AORTA N/A 05/18/2023   Procedure: REPLACEMENT OF ASCENDING AORTIC ANEURYSM USING A 30 X 10 MM HEMASHIELD PLATINUM GRAFT;  Surgeon: Alleen Borne, MD;  Location: [facility] OR;  Service: Open Heart Surgery;  Laterality: N/A;  CIRC ARREST   RIGHT HEART CATH AND CORONARY ANGIOGRAPHY N/A 04/03/2023   Procedure: RIGHT HEART CATH AND CORONARY ANGIOGRAPHY;  Surgeon: Tonny Bollman, MD;  Location: [facility] INVASIVE CV LAB;  Service: Cardiovascular;  Laterality: N/A;   ROBOT ASSISTED LAPAROSCOPIC NEPHRECTOMY  01/14/2012   Procedure: ROBOTIC ASSISTED LAPAROSCOPIC NEPHRECTOMY;  Surgeon: Milford Cage, MD;  Location: [facility] ORS;  Service: Urology;  Laterality: Right;  Robot Laparoscopic Right Partial Nephrectomy     TEE WITHOUT CARDIOVERSION N/A 05/18/2023   Procedure: TRANSESOPHAGEAL ECHOCARDIOGRAM;  Surgeon: Alleen Borne, MD;  Location: [facility] OR;  Service: Open Heart Surgery;  Laterality: N/A;   TONSILLECTOMY     Patient Active Problem List   Diagnosis Date Noted   Atrial fibrillation (HCC) 06/08/2023   Encounter for therapeutic drug monitoring 06/08/2023   CVA (cerebral vascular accident) (HCC) 05/25/2023   S/P AVR (aortic valve replacement) 05/18/2023   Chronic cough 02/20/2023   Myelopathy concurrent with and due to spinal stenosis  of cervical region [facility]) 02/15/2021   Right leg weakness 02/07/2021   Allodynia 02/07/2021   Gait disturbance 02/07/2021   White matter abnormality on MRI of brain 02/07/2021   Hyperreflexia 02/07/2021   Special screening for malignant neoplasms, colon    Adenomatous polyp of transverse colon    Diverticulosis of large intestine without diverticulitis    PSVT (paroxysmal supraventricular tachycardia) 05/29/2014   Precordial pain 04/19/2014   Coronary atherosclerosis of native coronary artery 04/06/2014   Essential hypertension,  benign 04/06/2014   HLD (hyperlipidemia) 04/06/2014   PCP: [name], PA-C REFERRING PROVIDER: Dr. Claudette Laws  ONSET DATE: 05/20/23  REFERRING DIAG: I63.9 (ICD-10-CM) - Cerebrovascular accident (CVA), unspecified mechanism (HCC)   THERAPY DIAG:  Other lack of coordination  Other symptoms and signs involving the nervous system  Rationale for Evaluation and Treatment: Rehabilitation  SUBJECTIVE:   SUBJECTIVE STATEMENT: S: "I need more time working on my hand"  PERTINENT HISTORY: Pt is a 67 year old male who presented to the [facility] for AVR and ascending aortic aneurysm repair on 05/18/2023. He had evidence of bicuspid aortic valve stenosis with 5.1 cm ascending aortic aneurysm and underwent repair by Dr. Laneta Simmers on 05/18/2023. He was extubated later that day. He had brief episode of hematuria after difficult Foley placement. Remains on Proscar for urinary retention. Intraoperative TEE with EF approximately 50%. Pacer wires and chest tubes removed on 7/03. Chest x-ray was stable and he was transferred out to stepdown unit. Acute blood loss anemia treated with oral iron replacement. Aspirin dosing decreased to 81 mg due to post-operative thrombocytopenia. Later in the evening on 7/03 he was noted to have right hand weakness and rapid response was called. He was noted to have atrial fibrillation with RVR and was hypotensive. He was started on an amiodarone infusion and cardiology consulted. He was transition to amiodarone 200 mg twice daily on 7/04. CT of head without evidence of acute infarct or hemorrhage, however brain MRI showed scattered small acute infarcts in the left cerebellar hemisphere in both cerebral hemispheres likely embolic in etiology.   PRECAUTIONS: Sternal and Fall  WEIGHT BEARING RESTRICTIONS: No  PAIN:  Are you having pain? No  FALLS: Has patient fallen in last 6 months? No  LIVING ENVIRONMENT: Lives with: lives with their family Lives in: House/apartment Stairs:  Yes: External: 2 steps; bilateral but cannot reach both Has following equipment at home: Single point cane and Walker - 2 wheeled  PLOF: Independent  PATIENT GOALS: To get stronger.   OBJECTIVE:   HAND DOMINANCE: Right  ADLs: Overall ADLs: Pt reports he requires increased time for ADLs. Is not driving yet. Pt reports meal preparation is difficult due to being fatigued. Picking up items is difficult, eating with right hand is difficult. Has difficulty with manipulating items.   ACTIVITY TOLERANCE: Activity tolerance: Limited due to recent heart sx  FUNCTIONAL OUTCOME MEASURES: FOTO: 63/100  UPPER EXTREMITY ROM:    BUE A/ROM is [facility]  UPPER EXTREMITY MMT:     MMT Right eval  Shoulder flexion 5/5  Shoulder abduction 5/5  Shoulder internal rotation 5/5  Shoulder external rotation 4+/5  Elbow flexion 5/5  Elbow extension 5/5  Wrist flexion 4/5  Wrist extension 5/5  Wrist ulnar deviation 4/5  Wrist radial deviation 4/5  Wrist pronation 5/5  Wrist supination 5/5  (Blank rows = not tested)  HAND FUNCTION: Grip strength: Right: 30 lbs; Left: 50 lbs, Lateral pinch: Right: 23 lbs, Left: 9 lbs, and 3 point  pinch: Right: 12 lbs, Left: 3 lbs  COORDINATION: 9 Hole Peg test: Right: 1' 04 sec; Left: 31.42 sec  SENSATION: Tingling in hands/feet at baseline  MUSCLE TONE: RUE: Mild    TODAY'S TREATMENT:                                                                                                                              DATE:   08/11/23 -theraputty: red putty, pushing 10 beads into putty, roll putty into ball, pinch and pull beads out, roll into ball, squeeze x10, roll into log, tripod pinch x10, lateral pinch x10 -Tweezers: picking up 10 tiny beads with tweezers -Coins: flipping 10 pennies, 3 nickels, and 2 dimes. Picking up 5 at a time and placing them in the piggy bank, dropping 2-3 coins each time  08/06/23 -Shoulder Strengthening: 4lb dumbbell, flexion, abduction,  protraction, horizontal abduction, er/IR, x12 -Tiny Peg board, using RUE only, placing 28 tiny pegs following a pattern -Proximal Shoulder Exercises: 3lb dumbbell, paddles, criss cross, circles both directions, 2x10 each -Digiflex: 7lbs, full squeeze 2x15, each digit x8 -Connect 4, holding 4-5 checkers at a time and placing them 1 by 1 in the board, x15 checkers  08/04/23 -Theraband strengthening: green-protraction, flexion, horizontal abduction, er, abduction, 10 reps -Wrist strengthening: 3lb weight-flexion, extension, supination, pronation, ulnar/radial deviation, 12 reps -Sponges: 18, 20 -Hand gripper: large beads vertical at 25#, medium beads vertical at 25#, 7 small beads vertical at 25# and 22#-increased time -Coin manipulation: unable with coins or sequence chips -Red theraputty: flatten, cutting circles with pvc pipe working on grip and wrist flexion/extension   PATIENT EDUCATION: Education details: Puzzles and nuts and bolts Person educated: Patient Education method: Programmer, multimedia, Facilities manager, and Handouts Education comprehension: verbalized understanding and returned demonstration  HOME EXERCISE PROGRAM: Eval: finger A/ROM 9/5: Finger Rubber Band Exercises 9/20: shoulder strengthening with dumbbells 9/25: Puzzles, Nuts and bolts   GOALS: Goals reviewed with patient? Yes  SHORT TERM GOALS: Target date: 08/15/23  Pt will be provided with and educated on HEP to improve mobility and functional use of right hand during ADLs.   Goal status: IN PROGRESS  2.  Pt will increase right grip strength by 20# and pinch strength by 4# or greater to improve ability to grasp and hold items for hygiene and meal preparation tasks.   Goal status: IN PROGRESS  3.  Pt will increase right fine motor coordination required for tying shoes or manipulating small objects by completing 9 hole peg test in 45" or less.   Goal status: IN PROGRESS  4.  Pt will be educated on AE available to  improve success and independence in ADL completion.   Goal status: IN PROGRESS    ASSESSMENT:  CLINICAL IMPRESSION: This session focused on fine motor coordination, which continues to be pt's weakest skill. He required increased time to complete all tasks due to their difficulty. OT providing verbal and tactile cuing for positioning and technique throughout  session.   PERFORMANCE DEFICITS: in functional skills including ADLs, IADLs, coordination, proprioception, sensation, tone, strength, Fine motor control, and UE functional use    PLAN:  OT FREQUENCY: 2x/week  OT DURATION: 4 weeks  PLANNED INTERVENTIONS: therapeutic exercise, therapeutic activity, neuromuscular re-education, splinting, electrical stimulation, ultrasound, patient/family education, and DME and/or AE instructions, self-care  RECOMMENDED OTHER SERVICES: PT  CONSULTED AND AGREED WITH PLAN OF CARE: Patient  PLAN FOR NEXT SESSION: Follow up on HEP, grip and pinch strengthening, coordination tasks   Trish Mage, OTR/L [phone] 08/12/2023, 9:13 PM

## 2023-08-12 ENCOUNTER — Encounter: Payer: Self-pay | Admitting: Surgery

## 2023-08-12 ENCOUNTER — Ambulatory Visit (INDEPENDENT_AMBULATORY_CARE_PROVIDER_SITE_OTHER): Payer: Medicare Other | Admitting: Surgery

## 2023-08-12 VITALS — BP 149/94 | HR 55 | Resp 18 | Ht 68.0 in | Wt 153.0 lb

## 2023-08-12 DIAGNOSIS — Z952 Presence of prosthetic heart valve: Secondary | ICD-10-CM

## 2023-08-12 DIAGNOSIS — Z9889 Other specified postprocedural states: Secondary | ICD-10-CM

## 2023-08-12 DIAGNOSIS — Z8679 Personal history of other diseases of the circulatory system: Secondary | ICD-10-CM

## 2023-08-12 NOTE — Progress Notes (Unsigned)
    HPI:  Patient returns for routine postoperative follow-up having undergone aortic valve replacement using a 23 mm INSPIRIS RESILIA pericardial valve and replacement of an ascending aortic aneurysm using deep hypothermic circulatory arrest on 05/18/2023. The patient's early postoperative recovery while in the hospital was notable for development of postoperative atrial fibrillation converted with amiodarone.  On postoperative day 2 he was noted to have right hand weakness and a CT of the head was unremarkable.  MRI of the brain showed scattered small acute infarcts in the left cerebellar hemisphere as well as in both cerebral hemispheres that were felt to be embolic in etiology.  He was started on Coumadin.  Since I last saw him on 07/01/2023 he reports that he has continued making improvement.  He is attending occupational therapy and physical therapy. He still has some weakness in his right upper extremity but it is improving.  He has some weakness in the right lower extremity which has was present preoperatively but it does seem a little worse since his stroke.  He is walking with a cane.  He has noted a poor appetite and that foods do not taste very good to him.  I stopped his amiodarone at his last visit to see if this would improve his appetite but it has not changed.  Current Outpatient Medications  Medication Sig Dispense Refill   acetaminophen (TYLENOL) 325 MG tablet Take 1-2 tablets (325-650 mg total) by mouth every 4 (four) hours as needed for mild pain.     amoxicillin (AMOXIL) 500 MG capsule 4 500 Mg Tablets(2000 Mg) at once 4 capsule 0   aspirin EC 81 MG EC tablet Take 1 tablet (81 mg total) by mouth daily.     atorvastatin (LIPITOR) 80 MG tablet Take 1 tablet (80 mg total) by mouth daily. 90 tablet 3   baclofen (LIORESAL) 10 MG tablet Take 1 tablet (10 mg total) by mouth daily.     bisacodyl (DULCOLAX) 5 MG EC tablet Take 1 tablet (5 mg total) by mouth daily at 2 PM. (Patient taking  differently: Take 5 mg by mouth daily at 2 PM. Liquid form, every other day)     finasteride (PROSCAR) 5 MG tablet Take 1 tablet (5 mg total) by mouth daily. 30 tablet 0   metoprolol tartrate (LOPRESSOR) 25 MG tablet Take 1 tablet (25 mg total) by mouth 2 (two) times daily. 180 tablet 1   warfarin (COUMADIN) 2.5 MG tablet Take 1 to 2 tablets daily or as directed by coumadin clinic 45 tablet 5   No current facility-administered medications for this visit.     Physical Exam: BP (!) 149/94 (BP Location: Left Arm, Patient Position: Sitting)   Pulse (!) 55   Resp 18   Ht 5\' 8"  (1.727 m)   Wt 153 lb (69.4 kg)   SpO2 98% Comment: rA  BMI 23.26 kg/m  He looks well. Cardiac exam shows a regular rate and rhythm with normal heart sounds.  There is no murmur. Lung exam is clear. The chest incision is well-healed and the sternum is stable. There is no peripheral edema.  Diagnostic Tests:  None today  Impression: ***  Plan: ***   Alleen Borne, MD [facility] [phone]

## 2023-08-13 ENCOUNTER — Encounter (HOSPITAL_COMMUNITY): Payer: Self-pay | Admitting: Occupational Therapy

## 2023-08-13 ENCOUNTER — Ambulatory Visit (HOSPITAL_COMMUNITY): Payer: Medicare Other | Admitting: Occupational Therapy

## 2023-08-13 ENCOUNTER — Ambulatory Visit (HOSPITAL_COMMUNITY): Payer: Medicare Other

## 2023-08-13 DIAGNOSIS — R29818 Other symptoms and signs involving the nervous system: Secondary | ICD-10-CM

## 2023-08-13 DIAGNOSIS — Z7409 Other reduced mobility: Secondary | ICD-10-CM

## 2023-08-13 DIAGNOSIS — R278 Other lack of coordination: Secondary | ICD-10-CM

## 2023-08-13 DIAGNOSIS — I639 Cerebral infarction, unspecified: Secondary | ICD-10-CM | POA: Diagnosis not present

## 2023-08-13 DIAGNOSIS — M6281 Muscle weakness (generalized): Secondary | ICD-10-CM

## 2023-08-13 NOTE — Therapy (Signed)
OUTPATIENT PHYSICAL THERAPY NEURO PROGRESS NOTE   Patient Name: [name] MRN: [id number] DOB:[birth year], 67 y.o., male Today's Date: 08/13/2023  END OF SESSION:   PT End of Session - 08/13/23 1306     Visit Number 8    Number of Visits 16    Date for PT Re-Evaluation 10/08/23    Authorization Type Medicare part A & B    Authorization Time Period no visit limit; no auth    Progress Note Due on Visit 16    PT Start Time 1300    PT Stop Time 1345    PT Time Calculation (min) 45 min    Activity Tolerance Patient tolerated treatment well    Behavior During Therapy WFL for tasks assessed/performed              Past Medical History:  Diagnosis Date   Arthritis    Coronary atherosclerosis of native coronary artery    a. NSTEMI (02/2014):  LHC (02/2014):  Mild disease in LAD and CFX; prox RCA occluded with R-R collats, dist RCA filled by L-R collats, inf HK, EF 55%, LVEDP 15 mmHg.  PCI:  Unsuccessful angioplasty of RCA (late presentation of inf MI) - tx medically.   Essential hypertension    GERD (gastroesophageal reflux disease)    Hyperlipidemia    NSTEMI (non-ST elevated myocardial infarction) (HCC) 03/11/14   Renal cell carcinoma of right kidney (HCC)    Partial nephrectomy in 2013   Past Surgical History:  Procedure Laterality Date   AORTIC VALVE REPLACEMENT N/A 05/18/2023   Procedure: AORTIC VALVE REPLACEMENT (AVR) USING INSPIRIS RESILIA 23 MM AORTIC VALVE;  Surgeon: Alleen Borne, MD;  Location: [facility] OR;  Service: Open Heart Surgery;  Laterality: N/A;   BACK SURGERY     1989   CERVICAL DISC SURGERY  04/29/2021   COLONOSCOPY N/A 12/11/2020   Procedure: COLONOSCOPY;  Surgeon: Franky Macho, MD;  Location: [facility] ENDO SUITE;  Service: Gastroenterology;  Laterality: N/A;   LEFT HEART CATHETERIZATION WITH CORONARY ANGIOGRAM N/A 03/17/2014   Procedure: LEFT HEART CATHETERIZATION WITH CORONARY ANGIOGRAM;  Surgeon: Corky Crafts, MD;  Location: [facility] CATH LAB;  Service:  Cardiovascular;  Laterality: N/A;   POLYPECTOMY  12/11/2020   Procedure: POLYPECTOMY;  Surgeon: Franky Macho, MD;  Location: [facility] ENDO SUITE;  Service: Gastroenterology;;   REPLACEMENT ASCENDING AORTA N/A 05/18/2023   Procedure: REPLACEMENT OF ASCENDING AORTIC ANEURYSM USING A 30 X 10 MM HEMASHIELD PLATINUM GRAFT;  Surgeon: Alleen Borne, MD;  Location: [facility] OR;  Service: Open Heart Surgery;  Laterality: N/A;  CIRC ARREST   RIGHT HEART CATH AND CORONARY ANGIOGRAPHY N/A 04/03/2023   Procedure: RIGHT HEART CATH AND CORONARY ANGIOGRAPHY;  Surgeon: Tonny Bollman, MD;  Location: [facility] INVASIVE CV LAB;  Service: Cardiovascular;  Laterality: N/A;   ROBOT ASSISTED LAPAROSCOPIC NEPHRECTOMY  01/14/2012   Procedure: ROBOTIC ASSISTED LAPAROSCOPIC NEPHRECTOMY;  Surgeon: Milford Cage, MD;  Location: [facility] ORS;  Service: Urology;  Laterality: Right;  Robot Laparoscopic Right Partial Nephrectomy     TEE WITHOUT CARDIOVERSION N/A 05/18/2023   Procedure: TRANSESOPHAGEAL ECHOCARDIOGRAM;  Surgeon: Alleen Borne, MD;  Location: [facility] OR;  Service: Open Heart Surgery;  Laterality: N/A;   TONSILLECTOMY     Patient Active Problem List   Diagnosis Date Noted   Atrial fibrillation (HCC) 06/08/2023   Encounter for therapeutic drug monitoring 06/08/2023   CVA (cerebral vascular accident) (HCC) 05/25/2023   S/P AVR (aortic valve replacement) 05/18/2023   Chronic cough 02/20/2023  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 [facility]) 02/15/2021   Right leg weakness 02/07/2021   Allodynia 02/07/2021   Gait disturbance 02/07/2021   White matter abnormality on MRI of brain 02/07/2021   Hyperreflexia 02/07/2021   Special screening for malignant neoplasms, colon    Adenomatous polyp of transverse colon    Diverticulosis of large intestine without diverticulitis    PSVT (paroxysmal supraventricular tachycardia) 05/29/2014   Precordial pain 04/19/2014   Coronary atherosclerosis of native coronary  artery 04/06/2014   Essential hypertension, benign 04/06/2014   HLD (hyperlipidemia) 04/06/2014   Progress Note Reporting Period 07/16/23 to 08/13/23  See note below for Objective Data and Assessment of Progress/Goals.     PCP: [facility] REFERRING PROVIDER: Erick Colace, MD  ONSET DATE: 05/18/23  REFERRING DIAG: I63.9 (ICD-10-CM) - Cerebrovascular accident (CVA), unspecified mechanism (HCC)   THERAPY DIAG:  Muscle weakness (generalized) - Plan: PT plan of care cert/re-cert  Other symptoms and signs involving the nervous system - Plan: PT plan of care cert/re-cert  Impaired functional mobility, balance, gait, and endurance - Plan: PT plan of care cert/re-cert  Other lack of coordination - Plan: PT plan of care cert/re-cert  Rationale for Evaluation and Treatment: Rehabilitation  SUBJECTIVE:                                                                                                                                                                                             SUBJECTIVE STATEMENT: Patient states that there's no change on the status of his legs before his heart surgery. Patient states that he feels that he's back to his baseline but is actually unsure if he's back to his baseline. Patient states that his back hurts on occasion. Patient states that the legs did not get better (no change). Patient had always thought that PT will not help him because the leg issues were a pre-existing condition. Patient thinks that it will be his last visit because his LE did not get any better.  Eval:  Pt went into open heart surgery on 7/1 and had small stroke after surgery on primary RUE. Pt has had leg tests over the past few years regarding nerve conduction and circulation issues. Extensive discussion regarding multiple medical issues surrounding BLE weakness hsitory prior to stroke. Was avid golfer. "Along as I got some hope, I with you all the way."  Pt  accompanied by: self  PERTINENT HISTORY:  67 year old male who [facility] a ascending aortic aneurysm repair on 05/18/2023.  Patient with blood loss given treatment with iron replacement.  Later on 7 of 3/24 patient noted with right hand weakness.  Rapid response was called.  Patient was noted to be in atrial fibrillation with RVR and was hypotensive.  MRI showed scattered small acute infarction left cerebral hemisphere in both cerebral hemispheres.  C3 spinal surgery Open heart surgery on 05/18/23.  LBP and BLE weakness Dec'22 to current.   PAIN:  Are you having pain?  Pt reports chronic pain but nothing new.   PRECAUTIONS: Other: on sternal precautions for 12 weeks (till end of September 2024)  RED FLAGS: None   WEIGHT BEARING RESTRICTIONS: No  FALLS: Has patient fallen in last 6 months? No  LIVING ENVIRONMENT: Lives with: lives with their family Lives in: House/apartment Stairs:  2 steps in/out, no concerns.  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 Pt had Dme avilable prior to stroke but was not using those  dependently, only when back pain was bad.   PATIENT GOALS: "golf might be a lofty goal"  OBJECTIVE: (all objective findings are from initial evaluation 07/16/23 unless otherwise dated)  DIAGNOSTIC FINDINGS:   IMPRESSION: Scattered small acute infarcts in the left cerebellar hemisphere and both cerebral hemispheres, likely embolic in etiology.  COGNITION: Overall cognitive status: Within functional limits for tasks assessed   SENSATION: WFL  COORDINATION: WFL  POSTURE: No Significant postural limitations  LOWER EXTREMITY ROM:     Active  Right Eval Left Eval  Hip flexion    Hip extension    Hip abduction    Hip adduction    Hip internal rotation    Hip external rotation    Knee  flexion    Knee extension    Ankle dorsiflexion    Ankle plantarflexion    Ankle inversion    Ankle eversion     (Blank rows = not tested)  LOWER EXTREMITY MMT:    MMT Right Eval Left Eval Right 07/21/23 Left  07/21/23 Right 08/13/23 Left 08/13/23  Hip flexion   4/5 5/5 4+ 5  Hip extension   3+ Sidelying for sternal precaution 4-/5    Hip abduction   3+ 4/5 4 4   Hip adduction        Hip internal rotation        Hip external rotation        Knee flexion     4 4  Knee extension 3+ 3+   4 4  Ankle dorsiflexion 2+    3+ 4  Ankle plantarflexion     4- 4  Ankle inversion        Ankle eversion        (Blank rows = not tested)     GAIT: 08/13/23 Gait pattern:  decrease R knee flexion on swing phase, decreased step length- Right, decreased ankle dorsiflexion- Right, and Right foot flat Distance walked: 305 feet Assistive device utilized: Single point cane Level of assistance: SBA Comments: Patient ambulating with steady gait pattern without cueing, ambulating with right flatfoot and sliding through RLE swing.  FUNCTIONAL TESTS:  30 seconds chair stand test:08/13/23 12x from 10x 2 minute walk test: 08/13/23: 305 ft from 145ft  PATIENT SURVEYS:  ABC scale 08/13/23: 48% from 45% impairment.   TODAY'S TREATMENT:  DATE: 08/13/23:   Progress note ( , 30 sec stand test, ABC scale, MMT, subjective exam) Reviewed goals and course of rehab with patient  08/06/23:   Gait training with SPC x 226 CGA STS 10x eccentric control no HHA Gait training:    -Heel strike  -stance phase  -knee flexion  -swing phase RW 224ft cueing for knee flexion Heel tap on 3 cones (therapist chose LE and color x1, then 2 and 3 taps before), no HHA with Lt LE, UE support required with Rt LE stance) Squats 2x 10 front of chair Tandem stance 1x 30" on floor, 2x 30" on  foam    08/04/23 Ambulation with L quad cane x 250 ft with CGA-SBA Gastrocnemius slant board stretch x 30" x 3 Sit-to-stand x 10 x 2 Hip abd, GTB x 10 x 2 Hip ext, GTB x 10 x 2 Heel taps on cone x 2lbs x 10 x 2 on each Heel raises x 10 x 2 x 2 lbs Toe raises x 10 x 2 x 2 lbs High marches x 10 x 2 x 2 lbs   07/29/23 Gastrocnemius slant board stretch x 30" x 3 Sit-to-stand x 10 x 2 Hip abd, RTB x 10 x 2 Hip ext, RTB x 10 x 2 Mini squats x 3" x 10 x 2 Heel taps on floor marker x 10 x 2 on each Heel taps on cone x 10 x 2 on each Heel raises x 10 x 2 x 2 lbs High marches x 10 x 2 x 2 lbs  07/27/23 Heel raise x 10  Squat x 10  Marching x 10  Single leg stance x 3 one finger tip touch Sit to stand x 10 x 2  Side stepping with blue theraband x 2RT  Standing hip abduction with blue theraband  x 10 B  Standing hip extension with blue theraband x 10 B  Step up 4" step x 10 B  RT Knee flexion x 10   07/23/23 Educated on the importance of compliance to sternal precautions Seated hamstring stretch x 30" x 3 Standing:  Heel raises x 10 x 2  Toe raises x 10 x 2  Marches x 10 x 2  Mini squats x 3" x 10 x 2  07/21/23:  Reviewed goals Educated importance of HEP Compliance DGI with no AD, SBA with gait belt for safety MMT for hips Supine: Bridge Sidelying: Abduction Seated: STS eccentric control  - Toe raises  DGI complete with no AD, SBA for safety 1. Gait level surface (1) Moderate Impairment: Walks 20', slow speed, abnormal gait pattern, evidence for imbalance. 2. Change in gait speed (1) Moderate Impairment: Makes only minor adjustments to walking speed, or accomplishes a change in speed with significant gait deviations, or changes speed but has significant gait deviations, or changes speed but loses balance but is able to recover and continue walking. 3. Gait with horizontal head turns (1) Moderate Impairment: Performs head turns with moderate change in gait velocity, slows  down, staggers but recovers, can continue to walk. 4. Gait with vertical head turns (2) Mild Impairment: Performs head turns smoothly with slight change in gait velocity, i.e., minor disruption to smooth gait path or uses walking aid. 5. Gait and pivot turn (1) Moderate Impairment: Turns slowly, requires verbal cueing, requires several small steps to catch balance following turn and stop. 6. Step over obstacle (1) Moderate Impairment: Is able to step over box but must stop, then step over. May require  verbal cueing. 7. Step around obstacles (2) Mild Impairment: Is able to step around both cones, but must slow down and adjust steps to clear cones. 8. Stairs (1) Moderate Impairment: Two feet to a stair, must use rail.  TOTAL SCORE: 10 / 24   PATIENT EDUCATION: Education details: 08/13/23: Reviewed goals and course of rehab with patient Person educated: Patient Education method: Explanation Education comprehension: verbalized understanding  HOME EXERCISE PROGRAM: Access Code: [email or web address] URL: [email or web address] Date: 07/21/2023 Prepared by: Becky Sax  Exercises - Supine Bridge  - 2 x daily - 7 x weekly - 1 sets - 10 reps - 5" hold - Sidelying Hip Abduction  - 1 x daily - 7 x weekly - 3 sets - 10 reps - 3" hold - Sit to Stand Without Arm Support  - 3 x daily - 7 x weekly - 1 sets - 10 reps - Seated Toe Raise  - 3 x daily - 7 x weekly - 1 sets - 10 reps - 5" hold      07/27/23:              - Mini Squat with Counter Support  - 2 x daily - 7 x weekly - 1 sets - 10 reps - 5" hold - Hip Extension with Resistance Loop  - 1 x daily - 7 x weekly - 3 sets - 10 reps - Standing Hip Abduction with Resistance at Ankles and Counter Support  - 1 x daily - 7 x weekly - 3 sets - 10 reps - Standing Tandem Balance with Counter Support  - 1 x daily - 7 x weekly - 3 sets - 10 reps GOALS: Goals reviewed with patient? Yes  SHORT TERM GOALS: Target date: 08/13/2023  Pt and caregiver  will be independent with HEP in order to demonstrate participation in Physical Therapy POC.  Baseline: Goal status: IN PROGRESS  2.  Pt will report > 25% improvement in subjective safety/balance concerns in order to demonstrate improved self awareness of balance deficits.  Baseline:  Goal status: NOT MET  LONG TERM GOALS: Target date: 10/08/2023  Pt will increase 30 sec STS score by > 4 reps in order to demonstrate improved functional safety and balance skills in ADL/mobility.   Baseline: 10x Goal status: IN PROGRESS  2.  Pt will improve 2 MWT to >300 with LRAD in order to demonstrate improved functional mobility capacity in community setting.  Baseline: 185 ft with RW Goal status: MET  3.  Pt will improve ABC score by >15%  in order to demonstrate improved perceived balance with functional goals and outcomes. Baseline: See objective Goal status: IN PROGRESS  4.  Pt will report > 50% improvement in subjective safety/balance concerns in order to demonstrate improved self awareness of balance deficits..  Baseline: See objective Goal status: NOT MET  5. Pt will demonstrate increase in LE strength to 4/5 to facilitate ease and safety in ambulation Baseline:3+/5 Goal status: NEW GOAL   ASSESSMENT:  CLINICAL IMPRESSION: Patient demonstrated continued improvements in function as indicated by positive significant changes in . However, patient still presents with deficits in strength and gait pattern. With this, skilled PT is still required to address the impairments and functional limitations listed below with emphasis on balance, strength and improving gait mechanics.   Eval:  Patient is a 67y.o. male who was seen today for physical therapy evaluation and treatment for I63.9 (ICD-10-CM) - Cerebrovascular accident (CVA), unspecified mechanism (HCC).  Pt currently is living with  son and grandchildren, with intermittent assistance for ADLs.  Independent with rolling walker for  ambulation household.  Supervision ambulation with assistive device and community setting.  Prior to aortic aneurysm repair onset of CVA, patient was having decline in BLE muscle strength due to unknown back reasons, that have gone undiagnosed due to medical issues.  Prior to these events, patient was independent with all aspects of living, avid golfer.. Based upon today's evaluation, pt is demonstrating functional mobility, ambulation, ADL impairments due to new onset of CVA, BLE muscle weakness, balance limitations. Pt would benefit from skilled physical therapy services to address the above impairments/limitations and improve overall functional mobility and quality of life..   OBJECTIVE IMPAIRMENTS: Abnormal gait, decreased balance, decreased mobility, decreased ROM, decreased strength, and hypomobility.   ACTIVITY LIMITATIONS: carrying, lifting, bending, sitting, squatting, and locomotion level  PARTICIPATION LIMITATIONS: driving, community activity, and yard work  PERSONAL FACTORS: 08/13/23 Age and 1-2 comorbidities: hx of heart surgery  are also affecting patient's functional outcome.   REHAB POTENTIAL: 08/13/23: Fair    CLINICAL DECISION MAKING: Evolving/moderate complexity  EVALUATION COMPLEXITY: Moderate  PLAN:  PT FREQUENCY: 2x/week  PT DURATION: 8 weeks  PLANNED INTERVENTIONS: Therapeutic exercises, Therapeutic activity, Neuromuscular re-education, Balance training, Gait training, Patient/Family education, Self Care, Joint mobilization, Stair training, Orthotic/Fit training, DME instructions, Manual therapy, and Re-evaluation  PLAN FOR NEXT SESSION: Continue POC and may progress as tolerated with emphasis on LE functional strengthening.  May add hamstring strengthening next session.  Tish Frederickson. Brock Larmon, PT, DPT, OCS Board-Certified Clinical Specialist in Orthopedic PT PT Compact Privilege # ([place]): [id number] T 08/13/2023, 5:13 PM

## 2023-08-17 ENCOUNTER — Encounter: Payer: Medicare Other | Admitting: *Deleted

## 2023-08-17 DIAGNOSIS — Z5181 Encounter for therapeutic drug level monitoring: Secondary | ICD-10-CM

## 2023-08-17 DIAGNOSIS — Z952 Presence of prosthetic heart valve: Secondary | ICD-10-CM

## 2023-08-17 DIAGNOSIS — I480 Paroxysmal atrial fibrillation: Secondary | ICD-10-CM | POA: Diagnosis not present

## 2023-08-17 DIAGNOSIS — I639 Cerebral infarction, unspecified: Secondary | ICD-10-CM | POA: Diagnosis not present

## 2023-08-17 LAB — POCT INR: INR: 1.7 — AB (ref 2.0–3.0)

## 2023-08-17 NOTE — Patient Instructions (Signed)
Increase warfarin to 1 1/2 tablets daily except 1 tablet on Wednesdays and Saturdays Recheck in 2 weeks

## 2023-08-18 ENCOUNTER — Encounter (HOSPITAL_COMMUNITY): Payer: Medicare Other | Admitting: Occupational Therapy

## 2023-08-18 ENCOUNTER — Encounter (HOSPITAL_COMMUNITY): Payer: Self-pay

## 2023-08-18 ENCOUNTER — Ambulatory Visit (HOSPITAL_COMMUNITY): Payer: Medicare Other | Attending: Physical Medicine & Rehabilitation

## 2023-08-18 DIAGNOSIS — R278 Other lack of coordination: Secondary | ICD-10-CM | POA: Diagnosis present

## 2023-08-18 DIAGNOSIS — I2510 Atherosclerotic heart disease of native coronary artery without angina pectoris: Secondary | ICD-10-CM | POA: Insufficient documentation

## 2023-08-18 DIAGNOSIS — I10 Essential (primary) hypertension: Secondary | ICD-10-CM | POA: Diagnosis present

## 2023-08-18 DIAGNOSIS — M6281 Muscle weakness (generalized): Secondary | ICD-10-CM | POA: Diagnosis present

## 2023-08-18 DIAGNOSIS — Z8673 Personal history of transient ischemic attack (TIA), and cerebral infarction without residual deficits: Secondary | ICD-10-CM | POA: Diagnosis present

## 2023-08-18 DIAGNOSIS — D500 Iron deficiency anemia secondary to blood loss (chronic): Secondary | ICD-10-CM | POA: Diagnosis present

## 2023-08-18 DIAGNOSIS — I471 Supraventricular tachycardia, unspecified: Secondary | ICD-10-CM | POA: Insufficient documentation

## 2023-08-18 DIAGNOSIS — I9789 Other postprocedural complications and disorders of the circulatory system, not elsewhere classified: Secondary | ICD-10-CM | POA: Insufficient documentation

## 2023-08-18 DIAGNOSIS — Z85528 Personal history of other malignant neoplasm of kidney: Secondary | ICD-10-CM | POA: Insufficient documentation

## 2023-08-18 DIAGNOSIS — E785 Hyperlipidemia, unspecified: Secondary | ICD-10-CM | POA: Insufficient documentation

## 2023-08-18 DIAGNOSIS — I639 Cerebral infarction, unspecified: Secondary | ICD-10-CM | POA: Diagnosis present

## 2023-08-18 DIAGNOSIS — Z9889 Other specified postprocedural states: Secondary | ICD-10-CM | POA: Insufficient documentation

## 2023-08-18 DIAGNOSIS — Z8679 Personal history of other diseases of the circulatory system: Secondary | ICD-10-CM | POA: Insufficient documentation

## 2023-08-18 DIAGNOSIS — R29818 Other symptoms and signs involving the nervous system: Secondary | ICD-10-CM | POA: Insufficient documentation

## 2023-08-18 DIAGNOSIS — R001 Bradycardia, unspecified: Secondary | ICD-10-CM | POA: Diagnosis present

## 2023-08-18 DIAGNOSIS — I4891 Unspecified atrial fibrillation: Secondary | ICD-10-CM | POA: Diagnosis present

## 2023-08-18 DIAGNOSIS — R531 Weakness: Secondary | ICD-10-CM | POA: Diagnosis present

## 2023-08-18 DIAGNOSIS — Z952 Presence of prosthetic heart valve: Secondary | ICD-10-CM | POA: Insufficient documentation

## 2023-08-18 DIAGNOSIS — Z7409 Other reduced mobility: Secondary | ICD-10-CM | POA: Insufficient documentation

## 2023-08-18 NOTE — Therapy (Signed)
OUTPATIENT PHYSICAL THERAPY NEURO PROGRESS NOTE   Patient Name: [name] MRN: [id number] DOB:[birth year], 67 y.o., male Today's Date: 08/18/2023  END OF SESSION:   PT End of Session - 08/18/23 1517     Visit Number 9    Number of Visits 16    Date for PT Re-Evaluation 10/08/23    Authorization Type Medicare part A & B    Authorization Time Period no visit limit; no auth    Progress Note Due on Visit 16    PT Start Time 1351    PT Stop Time 1432    PT Time Calculation (min) 41 min    Activity Tolerance Patient tolerated treatment well    Behavior During Therapy WFL for tasks assessed/performed               Past Medical History:  Diagnosis Date   Arthritis    Coronary atherosclerosis of native coronary artery    a. NSTEMI (02/2014):  LHC (02/2014):  Mild disease in LAD and CFX; prox RCA occluded with R-R collats, dist RCA filled by L-R collats, inf HK, EF 55%, LVEDP 15 mmHg.  PCI:  Unsuccessful angioplasty of RCA (late presentation of inf MI) - tx medically.   Essential hypertension    GERD (gastroesophageal reflux disease)    Hyperlipidemia    NSTEMI (non-ST elevated myocardial infarction) (HCC) 03/11/14   Renal cell carcinoma of right kidney (HCC)    Partial nephrectomy in 2013   Past Surgical History:  Procedure Laterality Date   AORTIC VALVE REPLACEMENT N/A 05/18/2023   Procedure: AORTIC VALVE REPLACEMENT (AVR) USING INSPIRIS RESILIA 23 MM AORTIC VALVE;  Surgeon: Alleen Borne, MD;  Location: [facility] OR;  Service: Open Heart Surgery;  Laterality: N/A;   BACK SURGERY     1989   CERVICAL DISC SURGERY  04/29/2021   COLONOSCOPY N/A 12/11/2020   Procedure: COLONOSCOPY;  Surgeon: Franky Macho, MD;  Location: [facility];  Service: Gastroenterology;  Laterality: N/A;   LEFT HEART CATHETERIZATION WITH CORONARY ANGIOGRAM N/A 03/17/2014   Procedure: LEFT HEART CATHETERIZATION WITH CORONARY ANGIOGRAM;  Surgeon: Corky Crafts, MD;  Location: [facility] CATH LAB;  Service:  Cardiovascular;  Laterality: N/A;   POLYPECTOMY  12/11/2020   Procedure: POLYPECTOMY;  Surgeon: Franky Macho, MD;  Location: [facility];  Service: Gastroenterology;;   REPLACEMENT ASCENDING AORTA N/A 05/18/2023   Procedure: REPLACEMENT OF ASCENDING AORTIC ANEURYSM USING A 30 X 10 MM HEMASHIELD PLATINUM GRAFT;  Surgeon: Alleen Borne, MD;  Location: [facility] OR;  Service: Open Heart Surgery;  Laterality: N/A;  CIRC ARREST   RIGHT HEART CATH AND CORONARY ANGIOGRAPHY N/A 04/03/2023   Procedure: RIGHT HEART CATH AND CORONARY ANGIOGRAPHY;  Surgeon: Tonny Bollman, MD;  Location: [facility] INVASIVE CV LAB;  Service: Cardiovascular;  Laterality: N/A;   ROBOT ASSISTED LAPAROSCOPIC NEPHRECTOMY  01/14/2012   Procedure: ROBOTIC ASSISTED LAPAROSCOPIC NEPHRECTOMY;  Surgeon: Milford Cage, MD;  Location: [facility] ORS;  Service: Urology;  Laterality: Right;  Robot Laparoscopic Right Partial Nephrectomy     TEE WITHOUT CARDIOVERSION N/A 05/18/2023   Procedure: TRANSESOPHAGEAL ECHOCARDIOGRAM;  Surgeon: Alleen Borne, MD;  Location: [facility] OR;  Service: Open Heart Surgery;  Laterality: N/A;   TONSILLECTOMY     Patient Active Problem List   Diagnosis Date Noted   Atrial fibrillation (HCC) 06/08/2023   Encounter for therapeutic drug monitoring 06/08/2023   CVA (cerebral vascular accident) (HCC) 05/25/2023   S/P AVR (aortic valve replacement) 05/18/2023   Chronic cough  02/20/2023   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 [facility]) 02/15/2021   Right leg weakness 02/07/2021   Allodynia 02/07/2021   Gait disturbance 02/07/2021   White matter abnormality on MRI of brain 02/07/2021   Hyperreflexia 02/07/2021   Special screening for malignant neoplasms, colon    Adenomatous polyp of transverse colon    Diverticulosis of large intestine without diverticulitis    PSVT (paroxysmal supraventricular tachycardia) (HCC) 05/29/2014   Precordial pain 04/19/2014   Coronary atherosclerosis of native  coronary artery 04/06/2014   Essential hypertension, benign 04/06/2014   HLD (hyperlipidemia) 04/06/2014   Progress Note Reporting Period 07/16/23 to 08/13/23  See note below for Objective Data and Assessment of Progress/Goals.     PCP: [facility] REFERRING PROVIDER: Erick Colace, MD  ONSET DATE: 05/18/23  REFERRING DIAG: I63.9 (ICD-10-CM) - Cerebrovascular accident (CVA), unspecified mechanism (HCC)   THERAPY DIAG:  Muscle weakness (generalized)  Other symptoms and signs involving the nervous system  Impaired functional mobility, balance, gait, and endurance  Cerebrovascular accident (CVA), unspecified mechanism (HCC)  Rationale for Evaluation and Treatment: Rehabilitation  SUBJECTIVE:                                                                                                                                                                                             SUBJECTIVE STATEMENT: Pt stated no real changes.  No reports of pain, does c/o burning on Rt side of chest and reports ribs are poking out on Lt side.    Eval:  Pt went into open heart surgery on 7/1 and had small stroke after surgery on primary RUE. Pt has had leg tests over the past few years regarding nerve conduction and circulation issues. Extensive discussion regarding multiple medical issues surrounding BLE weakness hsitory prior to stroke. Was avid golfer. "Along as I got some hope, I with you all the way."  Pt accompanied by: self  PERTINENT HISTORY:  67 year old male who [facility] a ascending aortic aneurysm repair on 05/18/2023.  Patient with blood loss given treatment with iron replacement.  Later on 7 of 3/24 patient noted with right hand weakness.  Rapid response was called.  Patient was noted to be in atrial fibrillation with RVR and was hypotensive.  MRI showed scattered small acute infarction left cerebral hemisphere in both cerebral hemispheres.  C3  spinal surgery Open heart surgery on 05/18/23.  LBP and BLE weakness Dec'22 to current.   PAIN:  Are you having pain?  Pt reports chronic pain but nothing new.   PRECAUTIONS: Other: on sternal precautions  for 12 weeks (till end of September 2024)  RED FLAGS: None   WEIGHT BEARING RESTRICTIONS: No  FALLS: Has patient fallen in last 6 months? No  LIVING ENVIRONMENT: Lives with: lives with their family Lives in: House/apartment Stairs:  2 steps in/out, no concerns.  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 Pt had Dme avilable prior to stroke but was not using those  dependently, only when back pain was bad.   PATIENT GOALS: "golf might be a lofty goal"  OBJECTIVE: (all objective findings are from initial evaluation 07/16/23 unless otherwise dated)  DIAGNOSTIC FINDINGS:   IMPRESSION: Scattered small acute infarcts in the left cerebellar hemisphere and both cerebral hemispheres, likely embolic in etiology.  COGNITION: Overall cognitive status: Within functional limits for tasks assessed   SENSATION: WFL  COORDINATION: WFL  POSTURE: No Significant postural limitations  LOWER EXTREMITY ROM:     Active  Right Eval Left Eval  Hip flexion    Hip extension    Hip abduction    Hip adduction    Hip internal rotation    Hip external rotation    Knee flexion    Knee extension    Ankle dorsiflexion    Ankle plantarflexion    Ankle inversion    Ankle eversion     (Blank rows = not tested)  LOWER EXTREMITY MMT:    MMT Right Eval Left Eval Right 07/21/23 Left  07/21/23 Right 08/13/23 Left 08/13/23  Hip flexion   4/5 5/5 4+ 5  Hip extension   3+ Sidelying for sternal precaution 4-/5    Hip abduction   3+ 4/5 4 4   Hip adduction        Hip internal rotation        Hip external rotation        Knee flexion     4 4   Knee extension 3+ 3+   4 4  Ankle dorsiflexion 2+    3+ 4  Ankle plantarflexion     4- 4  Ankle inversion        Ankle eversion        (Blank rows = not tested)     GAIT: 08/13/23 Gait pattern:  decrease R knee flexion on swing phase, decreased step length- Right, decreased ankle dorsiflexion- Right, and Right foot flat Distance walked: 305 feet Assistive device utilized: Single point cane Level of assistance: SBA Comments: Patient ambulating with steady gait pattern without cueing, ambulating with right flatfoot and sliding through RLE swing.  FUNCTIONAL TESTS:  30 seconds chair stand test:08/13/23 12x from 10x 2 minute walk test: 08/13/23: 305 ft from 128ft  PATIENT SURVEYS:  ABC scale 08/13/23: 48% from 45% impairment.   TODAY'S TREATMENT:  DATE: 08/18/23: Leg press 3Pl 2x 10 slow controlled Standing:  Donkey kick 10x 3" holds Hamstring curls 10x Heel raises 20x5" 08/13/23:   Progress note ( , 30 sec stand test, ABC scale, MMT, subjective exam) Reviewed goals and course of rehab with patient  08/06/23:   Gait training with SPC x 226 CGA STS 10x eccentric control no HHA Gait training:    -Heel strike  -stance phase  -knee flexion  -swing phase RW 247ft cueing for knee flexion Heel tap on 3 cones (therapist chose LE and color x1, then 2 and 3 taps before), no HHA with Lt LE, UE support required with Rt LE stance) Squats 2x 10 front of chair Tandem stance 1x 30" on floor, 2x 30" on foam    08/04/23 Ambulation with L quad cane x 250 ft with CGA-SBA Gastrocnemius slant board stretch x 30" x 3 Sit-to-stand x 10 x 2 Hip abd, GTB x 10 x 2 Hip ext, GTB x 10 x 2 Heel taps on cone x 2lbs x 10 x 2 on each Heel raises x 10 x 2 x 2 lbs Toe raises x 10 x 2 x 2 lbs High marches x 10 x 2 x 2 lbs   07/29/23 Gastrocnemius slant board stretch x 30" x  3 Sit-to-stand x 10 x 2 Hip abd, RTB x 10 x 2 Hip ext, RTB x 10 x 2 Mini squats x 3" x 10 x 2 Heel taps on floor marker x 10 x 2 on each Heel taps on cone x 10 x 2 on each Heel raises x 10 x 2 x 2 lbs High marches x 10 x 2 x 2 lbs  07/27/23 Heel raise x 10  Squat x 10  Marching x 10  Single leg stance x 3 one finger tip touch Sit to stand x 10 x 2  Side stepping with blue theraband x 2RT  Standing hip abduction with blue theraband  x 10 B  Standing hip extension with blue theraband x 10 B  Step up 4" step x 10 B  RT Knee flexion x 10   07/23/23 Educated on the importance of compliance to sternal precautions Seated hamstring stretch x 30" x 3 Standing:  Heel raises x 10 x 2  Toe raises x 10 x 2  Marches x 10 x 2  Mini squats x 3" x 10 x 2  07/21/23:  Reviewed goals Educated importance of HEP Compliance DGI with no AD, SBA with gait belt for safety MMT for hips Supine: Bridge Sidelying: Abduction Seated: STS eccentric control  - Toe raises  DGI complete with no AD, SBA for safety 1. Gait level surface (1) Moderate Impairment: Walks 20', slow speed, abnormal gait pattern, evidence for imbalance. 2. Change in gait speed (1) Moderate Impairment: Makes only minor adjustments to walking speed, or accomplishes a change in speed with significant gait deviations, or changes speed but has significant gait deviations, or changes speed but loses balance but is able to recover and continue walking. 3. Gait with horizontal head turns (1) Moderate Impairment: Performs head turns with moderate change in gait velocity, slows down, staggers but recovers, can continue to walk. 4. Gait with vertical head turns (2) Mild Impairment: Performs head turns smoothly with slight change in gait velocity, i.e., minor disruption to smooth gait path or uses walking aid. 5. Gait and pivot turn (1) Moderate Impairment: Turns slowly, requires verbal cueing, requires several small steps to catch balance  following turn and stop.  6. Step over obstacle (1) Moderate Impairment: Is able to step over box but must stop, then step over. May require verbal cueing. 7. Step around obstacles (2) Mild Impairment: Is able to step around both cones, but must slow down and adjust steps to clear cones. 8. Stairs (1) Moderate Impairment: Two feet to a stair, must use rail.  TOTAL SCORE: 10 / 24   PATIENT EDUCATION: Education details: 08/13/23: Reviewed goals and course of rehab with patient Person educated: Patient Education method: Explanation Education comprehension: verbalized understanding  HOME EXERCISE PROGRAM: Access Code: [email or web address] URL: [email or web address] Date: 07/21/2023 Prepared by: Becky Sax  Exercises - Supine Bridge  - 2 x daily - 7 x weekly - 1 sets - 10 reps - 5" hold - Sidelying Hip Abduction  - 1 x daily - 7 x weekly - 3 sets - 10 reps - 3" hold - Sit to Stand Without Arm Support  - 3 x daily - 7 x weekly - 1 sets - 10 reps - Seated Toe Raise  - 3 x daily - 7 x weekly - 1 sets - 10 reps - 5" hold      07/27/23:              - Mini Squat with Counter Support  - 2 x daily - 7 x weekly - 1 sets - 10 reps - 5" hold - Hip Extension with Resistance Loop  - 1 x daily - 7 x weekly - 3 sets - 10 reps - Standing Hip Abduction with Resistance at Ankles and Counter Support  - 1 x daily - 7 x weekly - 3 sets - 10 reps - Standing Tandem Balance with Counter Support  - 1 x daily - 7 x weekly - 3 sets - 10 reps GOALS: Goals reviewed with patient? Yes  SHORT TERM GOALS: Target date: 08/13/2023  Pt and caregiver will be independent with HEP in order to demonstrate participation in Physical Therapy POC.  Baseline: Goal status: IN PROGRESS  2.  Pt will report > 25% improvement in subjective safety/balance concerns in order to demonstrate improved self awareness of balance deficits.  Baseline:  Goal status: NOT MET  LONG TERM GOALS: Target date: 10/08/2023  Pt will  increase 30 sec STS score by > 4 reps in order to demonstrate improved functional safety and balance skills in ADL/mobility.   Baseline: 10x Goal status: IN PROGRESS  2.  Pt will improve 2 MWT to >300 with LRAD in order to demonstrate improved functional mobility capacity in community setting.  Baseline: 185 ft with RW Goal status: MET  3.  Pt will improve ABC score by >15%  in order to demonstrate improved perceived balance with functional goals and outcomes. Baseline: See objective Goal status: IN PROGRESS  4.  Pt will report > 50% improvement in subjective safety/balance concerns in order to demonstrate improved self awareness of balance deficits..  Baseline: See objective Goal status: NOT MET  5. Pt will demonstrate increase in LE strength to 4/5 to facilitate ease and safety in ambulation Baseline:3+/5 Goal status: NEW GOAL   ASSESSMENT:  CLINICAL IMPRESSION: Session focus on specific hamstring strengthening to improve gait mechanics.  Added seated and standing exercises with cueing for mechanics.  Pt tolerated well.  Increased difficulty with standing hamstring curls due to mm weakness.  No reports of pain through session.  Pt stated he is unsure if the weakness is related to stroke as deficits there prior the CVA.  Eval:  Patient is a 67y.o. male who was seen today for physical therapy evaluation and treatment for I63.9 (ICD-10-CM) - Cerebrovascular accident (CVA), unspecified mechanism (HCC).  Pt currently is living with son and grandchildren, with intermittent assistance for ADLs.  Independent with rolling walker for ambulation household.  Supervision ambulation with assistive device and community setting.  Prior to aortic aneurysm repair onset of CVA, patient was having decline in BLE muscle strength due to unknown back reasons, that have gone undiagnosed due to medical issues.  Prior to these events, patient was independent with all aspects of living, avid golfer.. Based upon  today's evaluation, pt is demonstrating functional mobility, ambulation, ADL impairments due to new onset of CVA, BLE muscle weakness, balance limitations. Pt would benefit from skilled physical therapy services to address the above impairments/limitations and improve overall functional mobility and quality of life..   OBJECTIVE IMPAIRMENTS: Abnormal gait, decreased balance, decreased mobility, decreased ROM, decreased strength, and hypomobility.   ACTIVITY LIMITATIONS: carrying, lifting, bending, sitting, squatting, and locomotion level  PARTICIPATION LIMITATIONS: driving, community activity, and yard work  PERSONAL FACTORS: 08/13/23 Age and 1-2 comorbidities: hx of heart surgery  are also affecting patient's functional outcome.   REHAB POTENTIAL: 08/13/23: Fair    CLINICAL DECISION MAKING: Evolving/moderate complexity  EVALUATION COMPLEXITY: Moderate  PLAN:  PT FREQUENCY: 2x/week  PT DURATION: 8 weeks  PLANNED INTERVENTIONS: Therapeutic exercises, Therapeutic activity, Neuromuscular re-education, Balance training, Gait training, Patient/Family education, Self Care, Joint mobilization, Stair training, Orthotic/Fit training, DME instructions, Manual therapy, and Re-evaluation  PLAN FOR NEXT SESSION: Continue POC and may progress as tolerated with emphasis on LE functional strengthening.  May add hamstring strengthening next session.  Becky Sax, LPTA/CLT; CBIS [phone]  Juel Burrow, PTA 08/18/2023, 3:49 PM  08/18/2023, 3:49 PM

## 2023-08-21 ENCOUNTER — Encounter (HOSPITAL_COMMUNITY): Payer: Medicare Other | Admitting: Occupational Therapy

## 2023-08-25 ENCOUNTER — Ambulatory Visit: Payer: Medicare Other | Attending: Nurse Practitioner

## 2023-08-25 ENCOUNTER — Telehealth: Payer: Self-pay | Admitting: Nurse Practitioner

## 2023-08-25 DIAGNOSIS — I5030 Unspecified diastolic (congestive) heart failure: Secondary | ICD-10-CM

## 2023-08-25 DIAGNOSIS — I3139 Other pericardial effusion (noninflammatory): Secondary | ICD-10-CM | POA: Insufficient documentation

## 2023-08-25 MED ORDER — AMOXICILLIN 500 MG PO CAPS: 2000.0000 mg | ORAL_CAPSULE | ORAL | 2 refills | Status: DC

## 2023-08-25 NOTE — Telephone Encounter (Signed)
Amoxicillin 2,000 mg once PRN 30 - 60 minutes prior to dental procedure.

## 2023-08-25 NOTE — Telephone Encounter (Signed)
Pt is in office today for an echocardiogram and he stated that Lanora Manis prescribed his antibiotics to take before his dental work. Pt stated that he has done the dental work and finished the antibiotics but he is going to have more dental work and will need more antibiotics.

## 2023-08-26 ENCOUNTER — Ambulatory Visit (HOSPITAL_COMMUNITY): Payer: Medicare Other

## 2023-08-26 DIAGNOSIS — R29818 Other symptoms and signs involving the nervous system: Secondary | ICD-10-CM

## 2023-08-26 DIAGNOSIS — M6281 Muscle weakness (generalized): Secondary | ICD-10-CM

## 2023-08-26 DIAGNOSIS — I639 Cerebral infarction, unspecified: Secondary | ICD-10-CM

## 2023-08-26 DIAGNOSIS — Z7409 Other reduced mobility: Secondary | ICD-10-CM

## 2023-08-26 DIAGNOSIS — R278 Other lack of coordination: Secondary | ICD-10-CM

## 2023-08-26 LAB — ECHOCARDIOGRAM LIMITED
Area-P 1/2: 2.83 cm2
Calc EF: 51.7 %
S' Lateral: 3.4 cm
Single Plane A2C EF: 59.6 %
Single Plane A4C EF: 45.8 %

## 2023-08-26 NOTE — Therapy (Signed)
OUTPATIENT PHYSICAL THERAPY NEURO TREATMENT  Patient Name: [name] MRN: [id number] DOB:August 17, 1956, 67 y.o., male Today's Date: 08/26/2023  END OF SESSION:   PT End of Session - 08/26/23 1340     Visit Number 10    Number of Visits 16    Date for PT Re-Evaluation 10/08/23    Authorization Type Medicare part A & B    Authorization Time Period no visit limit; no auth    Progress Note Due on Visit 16    PT Start Time 1340    PT Stop Time 1420    PT Time Calculation (min) 40 min    Activity Tolerance Patient tolerated treatment well    Behavior During Therapy WFL for tasks assessed/performed               Past Medical History:  Diagnosis Date   Arthritis    Coronary atherosclerosis of native coronary artery    a. NSTEMI (02/2014):  LHC (02/2014):  Mild disease in LAD and CFX; prox RCA occluded with R-R collats, dist RCA filled by L-R collats, inf HK, EF 55%, LVEDP 15 mmHg.  PCI:  Unsuccessful angioplasty of RCA (late presentation of inf MI) - tx medically.   Essential hypertension    GERD (gastroesophageal reflux disease)    Hyperlipidemia    NSTEMI (non-ST elevated myocardial infarction) (HCC) 03/11/14   Renal cell carcinoma of right kidney (HCC)    Partial nephrectomy in 2013   Past Surgical History:  Procedure Laterality Date   AORTIC VALVE REPLACEMENT N/A 05/18/2023   Procedure: AORTIC VALVE REPLACEMENT (AVR) USING INSPIRIS RESILIA 23 MM AORTIC VALVE;  Surgeon: Alleen Borne, MD;  Location: [facility] OR;  Service: Open Heart Surgery;  Laterality: N/A;   BACK SURGERY     1989   CERVICAL DISC SURGERY  04/29/2021   COLONOSCOPY N/A 12/11/2020   Procedure: COLONOSCOPY;  Surgeon: Franky Macho, MD;  Location: [facility];  Service: Gastroenterology;  Laterality: N/A;   LEFT HEART CATHETERIZATION WITH CORONARY ANGIOGRAM N/A 03/17/2014   Procedure: LEFT HEART CATHETERIZATION WITH CORONARY ANGIOGRAM;  Surgeon: Corky Crafts, MD;  Location: [facility] CATH LAB;  Service:  Cardiovascular;  Laterality: N/A;   POLYPECTOMY  12/11/2020   Procedure: POLYPECTOMY;  Surgeon: Franky Macho, MD;  Location: [facility];  Service: Gastroenterology;;   REPLACEMENT ASCENDING AORTA N/A 05/18/2023   Procedure: REPLACEMENT OF ASCENDING AORTIC ANEURYSM USING A 30 X 10 MM HEMASHIELD PLATINUM GRAFT;  Surgeon: Alleen Borne, MD;  Location: [facility] OR;  Service: Open Heart Surgery;  Laterality: N/A;  CIRC ARREST   RIGHT HEART CATH AND CORONARY ANGIOGRAPHY N/A 04/03/2023   Procedure: RIGHT HEART CATH AND CORONARY ANGIOGRAPHY;  Surgeon: Tonny Bollman, MD;  Location: [facility] INVASIVE CV LAB;  Service: Cardiovascular;  Laterality: N/A;   ROBOT ASSISTED LAPAROSCOPIC NEPHRECTOMY  01/14/2012   Procedure: ROBOTIC ASSISTED LAPAROSCOPIC NEPHRECTOMY;  Surgeon: Milford Cage, MD;  Location: [facility] ORS;  Service: Urology;  Laterality: Right;  Robot Laparoscopic Right Partial Nephrectomy     TEE WITHOUT CARDIOVERSION N/A 05/18/2023   Procedure: TRANSESOPHAGEAL ECHOCARDIOGRAM;  Surgeon: Alleen Borne, MD;  Location: [facility] OR;  Service: Open Heart Surgery;  Laterality: N/A;   TONSILLECTOMY     Patient Active Problem List   Diagnosis Date Noted   Atrial fibrillation (HCC) 06/08/2023   Encounter for therapeutic drug monitoring 06/08/2023   CVA (cerebral vascular accident) (HCC) 05/25/2023   S/P AVR (aortic valve replacement) 05/18/2023   Chronic cough 02/20/2023  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 [facility]) 02/15/2021   Right leg weakness 02/07/2021   Allodynia 02/07/2021   Gait disturbance 02/07/2021   White matter abnormality on MRI of brain 02/07/2021   Hyperreflexia 02/07/2021   Special screening for malignant neoplasms, colon    Adenomatous polyp of transverse colon    Diverticulosis of large intestine without diverticulitis    PSVT (paroxysmal supraventricular tachycardia) (HCC) 05/29/2014   Precordial pain 04/19/2014   Coronary atherosclerosis of native  coronary artery 04/06/2014   Essential hypertension, benign 04/06/2014   HLD (hyperlipidemia) 04/06/2014      PCP: [facility] REFERRING PROVIDER: Erick Colace, MD  ONSET DATE: 05/18/23  REFERRING DIAG: I63.9 (ICD-10-CM) - Cerebrovascular accident (CVA), unspecified mechanism (HCC)   THERAPY DIAG:  Muscle weakness (generalized)  Curtis symptoms and signs involving the nervous system  Impaired functional mobility, balance, gait, and endurance  Cerebrovascular accident (CVA), unspecified mechanism (HCC)  Curtis lack of coordination  Rationale for Evaluation and Treatment: Rehabilitation  SUBJECTIVE:                                                                                                                                                                                             SUBJECTIVE STATEMENT: Walks in today with a SPC; states he uses a walker when his back acts up.  He feels therapy is helping some with strength but some of his issues existed prior to heart surgery and CVA.  Wants to try to get in with Dr. Yetta Barre.  Larey Seat last night letting the dog out; stepped on dog food in floor and right elbow has a cut on his right elbow but otherwise ok. Wife recently broke her left humerus; in a sling.   Eval:  Pt went into open heart surgery on 7/1 and had small stroke after surgery on primary RUE. Pt has had leg tests over the past few years regarding nerve conduction and circulation issues. Extensive discussion regarding multiple medical issues surrounding BLE weakness hsitory prior to stroke. Was avid golfer. "Along as I got some hope, I with you all the way."  Pt accompanied by: self  PERTINENT HISTORY:  67 year old male who [facility] a ascending aortic aneurysm repair on 05/18/2023.  Patient with blood loss given treatment with iron replacement.  Later on 7 of 3/24 patient noted with right hand weakness.  Rapid response was called.   Patient was noted to be in atrial fibrillation with RVR and was hypotensive.  MRI showed scattered small acute infarction left cerebral hemisphere in both cerebral hemispheres.  C3  spinal surgery Open heart surgery on 05/18/23.  LBP and BLE weakness Dec'22 to current.   PAIN:  Are you having pain?  Pt reports chronic pain but nothing new.   PRECAUTIONS: Curtis: on sternal precautions for 12 weeks (till end of September 2024)  RED FLAGS: None   WEIGHT BEARING RESTRICTIONS: No  FALLS: Has patient fallen in last 6 months? No  LIVING ENVIRONMENT: Lives with: lives with their family Lives in: House/apartment Stairs:  2 steps in/out, no concerns.  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 Pt had Dme avilable prior to stroke but was not using those  dependently, only when back pain was bad.   PATIENT GOALS: "golf might be a lofty goal"  OBJECTIVE: (all objective findings are from initial evaluation 07/16/23 unless otherwise dated)  DIAGNOSTIC FINDINGS:   IMPRESSION: Scattered small acute infarcts in the left cerebellar hemisphere and both cerebral hemispheres, likely embolic in etiology.  COGNITION: Overall cognitive status: Within functional limits for tasks assessed   SENSATION: WFL  COORDINATION: WFL  POSTURE: No Significant postural limitations  LOWER EXTREMITY ROM:     Active  Right Eval Left Eval  Hip flexion    Hip extension    Hip abduction    Hip adduction    Hip internal rotation    Hip external rotation    Knee flexion    Knee extension    Ankle dorsiflexion    Ankle plantarflexion    Ankle inversion    Ankle eversion     (Blank rows = not tested)  LOWER EXTREMITY MMT:    MMT Right Eval Left Eval Right 07/21/23 Left  07/21/23 Right 08/13/23 Left 08/13/23  Hip flexion   4/5 5/5 4+ 5  Hip  extension   3+ Sidelying for sternal precaution 4-/5    Hip abduction   3+ 4/5 4 4   Hip adduction        Hip internal rotation        Hip external rotation        Knee flexion     4 4  Knee extension 3+ 3+   4 4  Ankle dorsiflexion 2+    3+ 4  Ankle plantarflexion     4- 4  Ankle inversion        Ankle eversion        (Blank rows = not tested)     GAIT: 08/13/23 Gait pattern:  decrease R knee flexion on swing phase, decreased step length- Right, decreased ankle dorsiflexion- Right, and Right foot flat Distance walked: 305 feet Assistive device utilized: Single point cane Level of assistance: SBA Comments: Patient ambulating with steady gait pattern without cueing, ambulating with right flatfoot and sliding through RLE swing.  FUNCTIONAL TESTS:  30 seconds chair stand test:08/13/23 12x from 10x 2 minute walk test: 08/13/23: 305 ft from 162ft  PATIENT SURVEYS:  ABC scale 08/13/23: 48% from 45% impairment.   TODAY'S TREATMENT:  DATE: 08/26/23 Heel raises on incline x 20 Heel raises on decline x 20 Sit to stand 3 x 5 reps; last 10 reps with left foot advanced 6" step toe taps x 10  "Gold swings" with 2# dowel 2 x 20 reps with CGA for safety Hip vectors 2 x 5 each side    08/18/23: Leg press 3Pl 2x 10 slow controlled Standing:  Donkey kick 10x 3" holds Hamstring curls 10x Heel raises 20x5"  08/13/23:   Progress note ( , 30 sec stand test, ABC scale, MMT, subjective exam) Reviewed goals and course of rehab with patient  08/06/23:   Gait training with SPC x 226 CGA STS 10x eccentric control no HHA Gait training:    -Heel strike  -stance phase  -knee flexion  -swing phase RW 260ft cueing for knee flexion Heel tap on 3 cones (therapist chose LE and color x1, then 2 and 3 taps before), no HHA with Lt LE, UE support required with Rt LE  stance) Squats 2x 10 front of chair Tandem stance 1x 30" on floor, 2x 30" on foam    08/04/23 Ambulation with L quad cane x 250 ft with CGA-SBA Gastrocnemius slant board stretch x 30" x 3 Sit-to-stand x 10 x 2 Hip abd, GTB x 10 x 2 Hip ext, GTB x 10 x 2 Heel taps on cone x 2lbs x 10 x 2 on each Heel raises x 10 x 2 x 2 lbs Toe raises x 10 x 2 x 2 lbs High marches x 10 x 2 x 2 lbs   07/29/23 Gastrocnemius slant board stretch x 30" x 3 Sit-to-stand x 10 x 2 Hip abd, RTB x 10 x 2 Hip ext, RTB x 10 x 2 Mini squats x 3" x 10 x 2 Heel taps on floor marker x 10 x 2 on each Heel taps on cone x 10 x 2 on each Heel raises x 10 x 2 x 2 lbs High marches x 10 x 2 x 2 lbs  07/27/23 Heel raise x 10  Squat x 10  Marching x 10  Single leg stance x 3 one finger tip touch Sit to stand x 10 x 2  Side stepping with blue theraband x 2RT  Standing hip abduction with blue theraband  x 10 B  Standing hip extension with blue theraband x 10 B  Step up 4" step x 10 B  RT Knee flexion x 10   07/23/23 Educated on the importance of compliance to sternal precautions Seated hamstring stretch x 30" x 3 Standing:  Heel raises x 10 x 2  Toe raises x 10 x 2  Marches x 10 x 2  Mini squats x 3" x 10 x 2  07/21/23:  Reviewed goals Educated importance of HEP Compliance DGI with no AD, SBA with gait belt for safety MMT for hips Supine: Bridge Sidelying: Abduction Seated: STS eccentric control  - Toe raises  DGI complete with no AD, SBA for safety 1. Gait level surface (1) Moderate Impairment: Walks 20', slow speed, abnormal gait pattern, evidence for imbalance. 2. Change in gait speed (1) Moderate Impairment: Makes only minor adjustments to walking speed, or accomplishes a change in speed with significant gait deviations, or changes speed but has significant gait deviations, or changes speed but loses balance but is able to recover and continue walking. 3. Gait with horizontal head turns (1) Moderate  Impairment: Performs head turns with moderate change in gait velocity, slows down, staggers but recovers,  can continue to walk. 4. Gait with vertical head turns (2) Mild Impairment: Performs head turns smoothly with slight change in gait velocity, i.e., minor disruption to smooth gait path or uses walking aid. 5. Gait and pivot turn (1) Moderate Impairment: Turns slowly, requires verbal cueing, requires several small steps to catch balance following turn and stop. 6. Step over obstacle (1) Moderate Impairment: Is able to step over box but must stop, then step over. May require verbal cueing. 7. Step around obstacles (2) Mild Impairment: Is able to step around both cones, but must slow down and adjust steps to clear cones. 8. Stairs (1) Moderate Impairment: Two feet to a stair, must use rail.  TOTAL SCORE: 10 / 24   PATIENT EDUCATION: Education details: 08/13/23: Reviewed goals and course of rehab with patient Person educated: Patient Education method: Explanation Education comprehension: verbalized understanding  HOME EXERCISE PROGRAM: Access Code: [email or web address] URL: [email or web address] Date: 07/21/2023 Prepared by: Becky Sax  Exercises - Supine Bridge  - 2 x daily - 7 x weekly - 1 sets - 10 reps - 5" hold - Sidelying Hip Abduction  - 1 x daily - 7 x weekly - 3 sets - 10 reps - 3" hold - Sit to Stand Without Arm Support  - 3 x daily - 7 x weekly - 1 sets - 10 reps - Seated Toe Raise  - 3 x daily - 7 x weekly - 1 sets - 10 reps - 5" hold      07/27/23:              - Mini Squat with Counter Support  - 2 x daily - 7 x weekly - 1 sets - 10 reps - 5" hold - Hip Extension with Resistance Loop  - 1 x daily - 7 x weekly - 3 sets - 10 reps - Standing Hip Abduction with Resistance at Ankles and Counter Support  - 1 x daily - 7 x weekly - 3 sets - 10 reps - Standing Tandem Balance with Counter Support  - 1 x daily - 7 x weekly - 3 sets - 10 reps  08/26/23 3 way hip  (vectors)  GOALS: Goals reviewed with patient? Yes  SHORT TERM GOALS: Target date: 08/13/2023  Pt and caregiver will be independent with HEP in order to demonstrate participation in Physical Therapy POC.  Baseline: Goal status: IN PROGRESS  2.  Pt will report > 25% improvement in subjective safety/balance concerns in order to demonstrate improved self awareness of balance deficits.  Baseline:  Goal status: NOT MET  LONG TERM GOALS: Target date: 10/08/2023  Pt will increase 30 sec STS score by > 4 reps in order to demonstrate improved functional safety and balance skills in ADL/mobility.   Baseline: 10x Goal status: IN PROGRESS  2.  Pt will improve 2 MWT to >300 with LRAD in order to demonstrate improved functional mobility capacity in community setting.  Baseline: 185 ft with RW Goal status: MET  3.  Pt will improve ABC score by >15%  in order to demonstrate improved perceived balance with functional goals and outcomes. Baseline: See objective Goal status: IN PROGRESS  4.  Pt will report > 50% improvement in subjective safety/balance concerns in order to demonstrate improved self awareness of balance deficits..  Baseline: See objective Goal status: NOT MET  5. Pt will demonstrate increase in LE strength to 4/5 to facilitate ease and safety in ambulation Baseline:3+/5 Goal status: NEW GOAL   ASSESSMENT:  CLINICAL  IMPRESSION: Today's session with focus on lower extremity strengthening and balance; added golf swings with weighted dowel for weight shifting and coordination.  Patient needs cues to avoid trunk substitution with hip vectors; added hip vectors to HEP.  Patient works hard in therapy.  He has trouble using right hand for grasp.  Patient will benefit from continued skilled therapy services to address deficits and promote return to optimal function.       Eval:  Patient is a 67y.o. male who was seen today for physical therapy evaluation and treatment for I63.9  (ICD-10-CM) - Cerebrovascular accident (CVA), unspecified mechanism (HCC).  Pt currently is living with son and grandchildren, with intermittent assistance for ADLs.  Independent with rolling walker for ambulation household.  Supervision ambulation with assistive device and community setting.  Prior to aortic aneurysm repair onset of CVA, patient was having decline in BLE muscle strength due to unknown back reasons, that have gone undiagnosed due to medical issues.  Prior to these events, patient was independent with all aspects of living, avid golfer.. Based upon today's evaluation, pt is demonstrating functional mobility, ambulation, ADL impairments due to new onset of CVA, BLE muscle weakness, balance limitations. Pt would benefit from skilled physical therapy services to address the above impairments/limitations and improve overall functional mobility and quality of life..   OBJECTIVE IMPAIRMENTS: Abnormal gait, decreased balance, decreased mobility, decreased ROM, decreased strength, and hypomobility.   ACTIVITY LIMITATIONS: carrying, lifting, bending, sitting, squatting, and locomotion level  PARTICIPATION LIMITATIONS: driving, community activity, and yard work  PERSONAL FACTORS: 08/13/23 Age and 1-2 comorbidities: hx of heart surgery  are also affecting patient's functional outcome.   REHAB POTENTIAL: 08/13/23: Fair    CLINICAL DECISION MAKING: Evolving/moderate complexity  EVALUATION COMPLEXITY: Moderate  PLAN:  PT FREQUENCY: 2x/week  PT DURATION: 8 weeks  PLANNED INTERVENTIONS: Therapeutic exercises, Therapeutic activity, Neuromuscular re-education, Balance training, Gait training, Patient/Family education, Self Care, Joint mobilization, Stair training, Orthotic/Fit training, DME instructions, Manual therapy, and Re-evaluation  PLAN FOR NEXT SESSION: Continue POC and may progress as tolerated with emphasis on LE functional strengthening.  May add hamstring strengthening next session.  Try  squat and reach activity  2:36 PM, 08/26/23 Thursa Emme Small Kylar Speelman MPT [facility] [place] [phone]

## 2023-08-28 ENCOUNTER — Ambulatory Visit (HOSPITAL_COMMUNITY): Payer: Medicare Other | Admitting: Occupational Therapy

## 2023-08-28 ENCOUNTER — Encounter (HOSPITAL_COMMUNITY): Payer: Self-pay | Admitting: Occupational Therapy

## 2023-08-28 DIAGNOSIS — R278 Other lack of coordination: Secondary | ICD-10-CM

## 2023-08-28 DIAGNOSIS — M6281 Muscle weakness (generalized): Secondary | ICD-10-CM | POA: Diagnosis not present

## 2023-08-28 NOTE — Therapy (Signed)
OUTPATIENT OCCUPATIONAL THERAPY NEURO TREATMENT   Patient Name: [name] MRN: [id number] DOB:December 07, 1955, 67 y.o., male Today's Date: 08/28/2023    END OF SESSION:  OT End of Session - 08/28/23 1126     Visit Number 9    Number of Visits 16    Date for OT Re-Evaluation 09/18/23    Authorization Type 1) Medicare A & B  2) [employer]    Progress Note Due on Visit 10    OT Start Time 1100    OT Stop Time 1145    OT Time Calculation ([name]) 45 [name]    Activity Tolerance Patient tolerated treatment well    Behavior During Therapy WFL for tasks assessed/performed              Past Medical History:  Diagnosis Date   Arthritis    Coronary atherosclerosis of native coronary artery    a. NSTEMI (02/2014):  LHC (02/2014):  Mild disease in LAD and CFX; prox RCA occluded with R-R collats, dist RCA filled by L-R collats, inf HK, EF 55%, LVEDP 15 mmHg.  PCI:  Unsuccessful angioplasty of RCA (late presentation of inf MI) - tx medically.   Essential hypertension    GERD (gastroesophageal reflux disease)    Hyperlipidemia    NSTEMI (non-ST elevated myocardial infarction) (HCC) 03/11/14   Renal cell carcinoma of right kidney (HCC)    Partial nephrectomy in 2013   Past Surgical History:  Procedure Laterality Date   AORTIC VALVE REPLACEMENT N/A 05/18/2023   Procedure: AORTIC VALVE REPLACEMENT (AVR) USING INSPIRIS RESILIA 23 MM AORTIC VALVE;  Surgeon: Alleen Borne, MD;  Location: [facility] OR;  Service: Open Heart Surgery;  Laterality: N/A;   BACK SURGERY     1989   CERVICAL DISC SURGERY  04/29/2021   COLONOSCOPY N/A 12/11/2020   Procedure: COLONOSCOPY;  Surgeon: Franky Macho, MD;  Location: [facility];  Service: Gastroenterology;  Laterality: N/A;   LEFT HEART CATHETERIZATION WITH CORONARY ANGIOGRAM N/A 03/17/2014   Procedure: LEFT HEART CATHETERIZATION WITH CORONARY ANGIOGRAM;  Surgeon: Corky Crafts, MD;  Location: [facility] CATH LAB;  Service: Cardiovascular;  Laterality: N/A;    POLYPECTOMY  12/11/2020   Procedure: POLYPECTOMY;  Surgeon: Franky Macho, MD;  Location: [facility];  Service: Gastroenterology;;   REPLACEMENT ASCENDING AORTA N/A 05/18/2023   Procedure: REPLACEMENT OF ASCENDING AORTIC ANEURYSM USING A 30 X 10 MM HEMASHIELD PLATINUM GRAFT;  Surgeon: Alleen Borne, MD;  Location: [facility] OR;  Service: Open Heart Surgery;  Laterality: N/A;  [facility] ARREST   RIGHT HEART CATH AND CORONARY ANGIOGRAPHY N/A 04/03/2023   Procedure: RIGHT HEART CATH AND CORONARY ANGIOGRAPHY;  Surgeon: Tonny Bollman, MD;  Location: [facility] INVASIVE CV LAB;  Service: Cardiovascular;  Laterality: N/A;   ROBOT ASSISTED LAPAROSCOPIC NEPHRECTOMY  01/14/2012   Procedure: ROBOTIC ASSISTED LAPAROSCOPIC NEPHRECTOMY;  Surgeon: Milford Cage, MD;  Location: [facility] ORS;  Service: Urology;  Laterality: Right;  Robot Laparoscopic Right Partial Nephrectomy     TEE WITHOUT CARDIOVERSION N/A 05/18/2023   Procedure: TRANSESOPHAGEAL ECHOCARDIOGRAM;  Surgeon: Alleen Borne, MD;  Location: [facility] OR;  Service: Open Heart Surgery;  Laterality: N/A;   TONSILLECTOMY     Patient Active Problem List   Diagnosis Date Noted   Atrial fibrillation (HCC) 06/08/2023   Encounter for therapeutic drug monitoring 06/08/2023   CVA (cerebral vascular accident) (HCC) 05/25/2023   S/P AVR (aortic valve replacement) 05/18/2023   Chronic cough 02/20/2023   Myelopathy concurrent with and due to  spinal stenosis of cervical region [facility]) 02/15/2021   Right leg weakness 02/07/2021   Allodynia 02/07/2021   Gait disturbance 02/07/2021   White matter abnormality on MRI of brain 02/07/2021   Hyperreflexia 02/07/2021   Special screening for malignant neoplasms, colon    Adenomatous polyp of transverse colon    Diverticulosis of large intestine without diverticulitis    PSVT (paroxysmal supraventricular tachycardia) (HCC) 05/29/2014   Precordial pain 04/19/2014   Coronary atherosclerosis of native coronary artery 04/06/2014   Essential  hypertension, benign 04/06/2014   HLD (hyperlipidemia) 04/06/2014   PCP: [name], PA-C REFERRING PROVIDER: Dr. Claudette Laws  ONSET DATE: 05/20/23  REFERRING DIAG: I63.9 (ICD-10-CM) - Cerebrovascular accident (CVA), unspecified mechanism (HCC)   THERAPY DIAG:  Other lack of coordination  Rationale for Evaluation and Treatment: Rehabilitation  SUBJECTIVE:   SUBJECTIVE STATEMENT: S: "I have been working hard at home"  PERTINENT HISTORY: Pt is a 66 year old male who presented to the [facility] for AVR and ascending aortic aneurysm repair on 05/18/2023. He had evidence of bicuspid aortic valve stenosis with 5.1 cm ascending aortic aneurysm and underwent repair by Dr. Laneta Simmers on 05/18/2023. He was extubated later that day. He had brief episode of hematuria after difficult Foley placement. Remains on Proscar for urinary retention. Intraoperative TEE with EF approximately 50%. Pacer wires and chest tubes removed on 7/03. Chest x-ray was stable and he was transferred out to stepdown unit. Acute blood loss anemia treated with oral iron replacement. Aspirin dosing decreased to 81 mg due to post-operative thrombocytopenia. Later in the evening on 7/03 he was noted to have right hand weakness and rapid response was called. He was noted to have atrial fibrillation with RVR and was hypotensive. He was started on an amiodarone infusion and cardiology consulted. He was transition to amiodarone 200 mg twice daily on 7/04. CT of head without evidence of acute infarct or hemorrhage, however brain MRI showed scattered small acute infarcts in the left cerebellar hemisphere in both cerebral hemispheres likely embolic in etiology.   PRECAUTIONS: Sternal and Fall  WEIGHT BEARING RESTRICTIONS: No  PAIN:  Are you having pain? No  FALLS: Has patient fallen in last 6 months? No  LIVING ENVIRONMENT: Lives with: lives with their family Lives in: House/apartment Stairs: Yes: External: 2 steps; bilateral but cannot  reach both Has following equipment at home: Single point cane and Walker - 2 wheeled  PLOF: Independent  PATIENT GOALS: To get stronger.   OBJECTIVE:   HAND DOMINANCE: Right  ADLs: Overall ADLs: Pt reports he requires increased time for ADLs. Is not driving yet. Pt reports meal preparation is difficult due to being fatigued. Picking up items is difficult, eating with right hand is difficult. Has difficulty with manipulating items.   ACTIVITY TOLERANCE: Activity tolerance: Limited due to recent heart sx  FUNCTIONAL OUTCOME MEASURES: FOTO: 63/100 08/13/23: 62.89/100  UPPER EXTREMITY ROM:    BUE A/ROM is [facility]  UPPER EXTREMITY MMT:     MMT Right eval Right 08/13/23  Shoulder flexion 5/5 5/5  Shoulder abduction 5/5 5/5  Shoulder internal rotation 5/5 5/5  Shoulder external rotation 4+/5 4+/5  Elbow flexion 5/5 5/5  Elbow extension 5/5 5/5  Wrist flexion 4/5 4+/5  Wrist extension 5/5 5/5  Wrist ulnar deviation 4/5 5/5  Wrist radial deviation 4/5 4+/5  Wrist pronation 5/5 5/5  Wrist supination 5/5 5/5  (Blank rows = not tested)  HAND FUNCTION: Grip strength: Right: 30 lbs; Left: 80 lbs, Lateral pinch:  Right: 9 lbs, Left: 23 lbs, and 3 point pinch: Right: 3 lbs, Left: 12 lbs 08/13/23: Grip Strength: Right: 46 lbs; Lateral pinch: Right: 12 lbs, 3 Point Pinch: Right: 8lbs  COORDINATION: 9 Hole Peg test: Right: 1' 04 sec; Left: 31.42 sec 08/13/23: 9 Hole Peg test: Right: 51.75 sec  SENSATION: Tingling in hands/feet at baseline  MUSCLE TONE: RUE: Mild    TODAY'S TREATMENT:                                                                                                                              DATE:   08/28/23 -Shoulder strength: shoulder press, bench press (sitting up), curls, shoulder flexion with 3 pound dowel rod 10x each exercise  -theraputty: red putty, pushing 10 beads into putty, roll putty into ball, pinch and pull beads out, roll into ball, squeeze x10, roll  into log, tripod pinch x10, lateral pinch x10  08/13/23 -AA/ROM: 3lb dowel, protraction, flexion, abduction, horizontal abduction, er/IR, x12 -A/ROM: protraction, flexion, abduction, horizontal abduction, er/IR, x12 -nuts, bolts, and washers: medium, small, and tiny bolts, tip pinch on, middle finger tip pinch to take off -9 hole Peg test -Measurements for Reassessment  08/11/23 -theraputty: red putty, pushing 10 beads into putty, roll putty into ball, pinch and pull beads out, roll into ball, squeeze x10, roll into log, tripod pinch x10, lateral pinch x10 -Tweezers: picking up 10 tiny beads with tweezers -Coins: flipping 10 pennies, 3 nickels, and 2 dimes. Picking up 5 at a time and placing them in the piggy bank, dropping 2-3 coins each time   PATIENT EDUCATION: Education details: Puzzles and nuts and bolts Person educated: Patient Education method: Explanation, Demonstration, and Handouts Education comprehension: verbalized understanding and returned demonstration  HOME EXERCISE PROGRAM: Eval: finger A/ROM 9/5: Finger Rubber Band Exercises 9/20: shoulder strengthening with dumbbells 9/25: Puzzles, Nuts and bolts   GOALS: Goals reviewed with patient? Yes  SHORT TERM GOALS: Target date: 08/15/23  Pt will be provided with and educated on HEP to improve mobility and functional use of right hand during ADLs.   Goal status: IN PROGRESS  2.  Pt will increase right grip strength by 20# and pinch strength by 4# or greater to improve ability to grasp and hold items for hygiene and meal preparation tasks.   Goal status: IN PROGRESS  3.  Pt will increase right fine motor coordination required for tying shoes or manipulating small objects by completing 9 hole peg test in 45" or less.   Goal status: IN PROGRESS  4.  Pt will be educated on AE available to improve success and independence in ADL completion.   Goal status: IN PROGRESS    ASSESSMENT:  CLINICAL IMPRESSION: Pt  reports that he is feeling better and has gotten more sleep lately, therefore he reports he is having a good day. He stated he has been completed HEP at home and focusing on fine motor such as pinching clothes  pins, gripper, and theraputty. Discussed continuing to practice coin manipulation at home to improve fine motor skills.   PERFORMANCE DEFICITS: in functional skills including ADLs, IADLs, coordination, proprioception, sensation, tone, strength, Fine motor control, and UE functional use    PLAN:  OT FREQUENCY: 2x/week  OT DURATION: 4 weeks  PLANNED INTERVENTIONS: therapeutic exercise, therapeutic activity, neuromuscular re-education, splinting, electrical stimulation, ultrasound, patient/family education, and DME and/or AE instructions, self-care  RECOMMENDED OTHER SERVICES: PT  CONSULTED AND AGREED WITH PLAN OF CARE: Patient  PLAN FOR NEXT SESSION: Follow up on HEP, grip and pinch strengthening, coordination tasks   Lurena Joiner, OTR/L [phone] 08/28/2023, 11:39 AM

## 2023-08-31 ENCOUNTER — Ambulatory Visit: Payer: Medicare Other | Attending: Cardiology | Admitting: *Deleted

## 2023-08-31 DIAGNOSIS — I480 Paroxysmal atrial fibrillation: Secondary | ICD-10-CM | POA: Diagnosis not present

## 2023-08-31 DIAGNOSIS — I639 Cerebral infarction, unspecified: Secondary | ICD-10-CM | POA: Diagnosis not present

## 2023-08-31 DIAGNOSIS — Z5181 Encounter for therapeutic drug level monitoring: Secondary | ICD-10-CM | POA: Diagnosis present

## 2023-08-31 DIAGNOSIS — Z952 Presence of prosthetic heart valve: Secondary | ICD-10-CM | POA: Insufficient documentation

## 2023-08-31 LAB — POCT INR: INR: 2.2 (ref 2.0–3.0)

## 2023-08-31 NOTE — Patient Instructions (Signed)
Continue warfarin 1 1/2 tablets daily except 1 tablet on Wednesdays and Saturdays Recheck in 3 weeks

## 2023-09-01 ENCOUNTER — Encounter (HOSPITAL_COMMUNITY): Payer: Medicare Other | Admitting: Occupational Therapy

## 2023-09-01 ENCOUNTER — Ambulatory Visit (HOSPITAL_COMMUNITY): Payer: Medicare Other

## 2023-09-03 ENCOUNTER — Encounter: Payer: Medicare Other | Admitting: Physical Medicine & Rehabilitation

## 2023-09-03 ENCOUNTER — Encounter (HOSPITAL_COMMUNITY): Payer: Medicare Other | Admitting: Occupational Therapy

## 2023-09-04 ENCOUNTER — Ambulatory Visit (HOSPITAL_COMMUNITY): Payer: Medicare Other | Admitting: Occupational Therapy

## 2023-09-04 ENCOUNTER — Encounter (HOSPITAL_COMMUNITY): Payer: Self-pay | Admitting: Occupational Therapy

## 2023-09-04 DIAGNOSIS — R278 Other lack of coordination: Secondary | ICD-10-CM

## 2023-09-04 DIAGNOSIS — R29818 Other symptoms and signs involving the nervous system: Secondary | ICD-10-CM

## 2023-09-04 DIAGNOSIS — M6281 Muscle weakness (generalized): Secondary | ICD-10-CM | POA: Diagnosis not present

## 2023-09-04 NOTE — Therapy (Signed)
OUTPATIENT OCCUPATIONAL THERAPY NEURO TREATMENT   Patient Name: [name] MRN: [id number] DOB:Jan 21, 1956, 67 y.o., male Today's Date: 09/04/2023  Progress Note Reporting Period 07/16/23 to 09/04/23  See note below for Objective Data and Assessment of Progress/Goals.      END OF SESSION:  OT End of Session - 09/04/23 1137     Visit Number 10    Number of Visits 16    Date for OT Re-Evaluation 09/18/23    Authorization Type 1) Medicare A & B  2) [employer]    Progress Note Due on Visit 20    OT Start Time 1058    OT Stop Time 1142    OT Time Calculation (min) 44 min    Activity Tolerance Patient tolerated treatment well    Behavior During Therapy WFL for tasks assessed/performed               Past Medical History:  Diagnosis Date   Arthritis    Coronary atherosclerosis of native coronary artery    a. NSTEMI (02/2014):  LHC (02/2014):  Mild disease in LAD and CFX; prox RCA occluded with R-R collats, dist RCA filled by L-R collats, inf HK, EF 55%, LVEDP 15 mmHg.  PCI:  Unsuccessful angioplasty of RCA (late presentation of inf MI) - tx medically.   Essential hypertension    GERD (gastroesophageal reflux disease)    Hyperlipidemia    NSTEMI (non-ST elevated myocardial infarction) (HCC) 03/11/14   Renal cell carcinoma of right kidney (HCC)    Partial nephrectomy in 2013   Past Surgical History:  Procedure Laterality Date   AORTIC VALVE REPLACEMENT N/A 05/18/2023   Procedure: AORTIC VALVE REPLACEMENT (AVR) USING INSPIRIS RESILIA 23 MM AORTIC VALVE;  Surgeon: Alleen Borne, MD;  Location: [facility] OR;  Service: Open Heart Surgery;  Laterality: N/A;   BACK SURGERY     1989   CERVICAL DISC SURGERY  04/29/2021   COLONOSCOPY N/A 12/11/2020   Procedure: COLONOSCOPY;  Surgeon: Franky Macho, MD;  [facility]: [facility];  Service: Gastroenterology;  Laterality: N/A;   LEFT HEART CATHETERIZATION WITH CORONARY ANGIOGRAM N/A 03/17/2014   Procedure: LEFT HEART CATHETERIZATION WITH  CORONARY ANGIOGRAM;  Surgeon: Corky Crafts, MD;  Location: [facility] CATH LAB;  Service: Cardiovascular;  Laterality: N/A;   POLYPECTOMY  12/11/2020   Procedure: POLYPECTOMY;  Surgeon: Franky Macho, MD;  Location: [facility];  Service: Gastroenterology;;   REPLACEMENT ASCENDING AORTA N/A 05/18/2023   Procedure: REPLACEMENT OF ASCENDING AORTIC ANEURYSM USING A 30 X 10 MM HEMASHIELD PLATINUM GRAFT;  Surgeon: Alleen Borne, MD;  Location: [facility] OR;  Service: Open Heart Surgery;  Laterality: N/A;  CIRC ARREST   RIGHT HEART CATH AND CORONARY ANGIOGRAPHY N/A 04/03/2023   Procedure: RIGHT HEART CATH AND CORONARY ANGIOGRAPHY;  Surgeon: Tonny Bollman, MD;  Location: [facility] INVASIVE CV LAB;  Service: Cardiovascular;  Laterality: N/A;   ROBOT ASSISTED LAPAROSCOPIC NEPHRECTOMY  01/14/2012   Procedure: ROBOTIC ASSISTED LAPAROSCOPIC NEPHRECTOMY;  Surgeon: Milford Cage, MD;  Location: [facility] ORS;  Service: Urology;  Laterality: Right;  Robot Laparoscopic Right Partial Nephrectomy     TEE WITHOUT CARDIOVERSION N/A 05/18/2023   Procedure: TRANSESOPHAGEAL ECHOCARDIOGRAM;  Surgeon: Alleen Borne, MD;  Location: [facility] OR;  Service: Open Heart Surgery;  Laterality: N/A;   TONSILLECTOMY     Patient Active Problem List   Diagnosis Date Noted   Atrial fibrillation (HCC) 06/08/2023   Encounter for therapeutic drug monitoring 06/08/2023   CVA (cerebral vascular accident) (HCC)  05/25/2023   S/P AVR (aortic valve replacement) 05/18/2023   Chronic cough 02/20/2023   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 [facility]) 02/15/2021   Right leg weakness 02/07/2021   Allodynia 02/07/2021   Gait disturbance 02/07/2021   White matter abnormality on MRI of brain 02/07/2021   Hyperreflexia 02/07/2021   Special screening for malignant neoplasms, colon    Adenomatous polyp of transverse colon    Diverticulosis of large intestine without diverticulitis    PSVT (paroxysmal supraventricular  tachycardia) (HCC) 05/29/2014   Precordial pain 04/19/2014   Coronary atherosclerosis of native coronary artery 04/06/2014   Essential hypertension, benign 04/06/2014   HLD (hyperlipidemia) 04/06/2014   PCP: [name], PA-C REFERRING PROVIDER: Dr. Claudette Laws  ONSET DATE: 05/20/23  REFERRING DIAG: I63.9 (ICD-10-CM) - Cerebrovascular accident (CVA), unspecified mechanism (HCC)   THERAPY DIAG:  Other lack of coordination  Other symptoms and signs involving the nervous system  Rationale for Evaluation and Treatment: Rehabilitation  SUBJECTIVE:   SUBJECTIVE STATEMENT: S: "I drove over here today from [place], first time."   PERTINENT HISTORY: Pt is a 67 year old male who presented to the [facility] for AVR and ascending aortic aneurysm repair on 05/18/2023. He had evidence of bicuspid aortic valve stenosis with 5.1 cm ascending aortic aneurysm and underwent repair by Dr. Laneta Simmers on 05/18/2023. He was extubated later that day. He had brief episode of hematuria after difficult Foley placement. Remains on Proscar for urinary retention. Intraoperative TEE with EF approximately 50%. Pacer wires and chest tubes removed on 7/03. Chest x-ray was stable and he was transferred out to stepdown unit. Acute blood loss anemia treated with oral iron replacement. Aspirin dosing decreased to 81 mg due to post-operative thrombocytopenia. Later in the evening on 7/03 he was noted to have right hand weakness and rapid response was called. He was noted to have atrial fibrillation with RVR and was hypotensive. He was started on an amiodarone infusion and cardiology consulted. He was transition to amiodarone 200 mg twice daily on 7/04. CT of head without evidence of acute infarct or hemorrhage, however brain MRI showed scattered small acute infarcts in the left cerebellar hemisphere in both cerebral hemispheres likely embolic in etiology.   PRECAUTIONS: Sternal and Fall  WEIGHT BEARING RESTRICTIONS: No  PAIN:  Are  you having pain? No  FALLS: Has patient fallen in last 6 months? No  LIVING ENVIRONMENT: Lives with: lives with their family Lives in: House/apartment Stairs: Yes: External: 2 steps; bilateral but cannot reach both Has following equipment at home: Single point cane and Walker - 2 wheeled  PLOF: Independent  PATIENT GOALS: To get stronger.   OBJECTIVE:   HAND DOMINANCE: Right  ADLs: Overall ADLs: Pt reports he requires increased time for ADLs. Is not driving yet. Pt reports meal preparation is difficult due to being fatigued. Picking up items is difficult, eating with right hand is difficult. Has difficulty with manipulating items.   ACTIVITY TOLERANCE: Activity tolerance: Limited due to recent heart sx  FUNCTIONAL OUTCOME MEASURES: FOTO: 63/100 08/13/23: 62.89/100  UPPER EXTREMITY ROM:    BUE A/ROM is [facility]  UPPER EXTREMITY MMT:     MMT Right eval Right 08/13/23  Shoulder flexion 5/5 5/5  Shoulder abduction 5/5 5/5  Shoulder internal rotation 5/5 5/5  Shoulder external rotation 4+/5 4+/5  Elbow flexion 5/5 5/5  Elbow extension 5/5 5/5  Wrist flexion 4/5 4+/5  Wrist extension 5/5 5/5  Wrist ulnar deviation 4/5 5/5  Wrist radial deviation 4/5 4+/5  Wrist pronation 5/5 5/5  Wrist supination 5/5 5/5  (Blank rows = not tested)  HAND FUNCTION: Grip strength: Right: 30 lbs; Left: 80 lbs, Lateral pinch: Right: 9 lbs, Left: 23 lbs, and 3 point pinch: Right: 3 lbs, Left: 12 lbs 08/13/23: Grip Strength: Right: 46 lbs; Lateral pinch: Right: 12 lbs, 3 Point Pinch: Right: 8lbs  COORDINATION: 9 Hole Peg test: Right: 1' 04 sec; Left: 31.42 sec 08/13/23: 9 Hole Peg test: Right: 51.75 sec  SENSATION: Tingling in hands/feet at baseline  MUSCLE TONE: RUE: Mild    TODAY'S TREATMENT:                                                                                                                              DATE:  09/04/23 -Table building: Pt working on assembling small table  using provided instructions. Pt using right hand to get hardware out of bag, operate screwdriver and wrench, left hand supporting. Intermittent cuing to use right hand as dominant during task. Increased time for manipulating nuts onto the screws using fingers. Increased time to complete.  -Grip strengthening: large, medium beads at 37#, vertical  08/28/23 -Shoulder strength: shoulder press, bench press (sitting up), curls, shoulder flexion with 3 pound dowel rod 10x each exercise  -theraputty: red putty, pushing 10 beads into putty, roll putty into ball, pinch and pull beads out, roll into ball, squeeze x10, roll into log, tripod pinch x10, lateral pinch x10  08/13/23 -AA/ROM: 3lb dowel, protraction, flexion, abduction, horizontal abduction, er/IR, x12 -A/ROM: protraction, flexion, abduction, horizontal abduction, er/IR, x12 -nuts, bolts, and washers: medium, small, and tiny bolts, tip pinch on, middle finger tip pinch to take off -9 hole Peg test -Measurements for Reassessment    PATIENT EDUCATION: Education details: reviewed HEP Person educated: Patient Education method: Programmer, multimedia, Demonstration, and Handouts Education comprehension: verbalized understanding and returned demonstration  HOME EXERCISE PROGRAM: Eval: finger A/ROM 9/5: Finger Rubber Band Exercises 9/20: shoulder strengthening with dumbbells 9/25: Puzzles, Nuts and bolts   GOALS: Goals reviewed with patient? Yes  SHORT TERM GOALS: Target date: 08/15/23  Pt will be provided with and educated on HEP to improve mobility and functional use of right hand during ADLs.   Goal status: IN PROGRESS  2.  Pt will increase right grip strength by 20# and pinch strength by 4# or greater to improve ability to grasp and hold items for hygiene and meal preparation tasks.   Goal status: IN PROGRESS  3.  Pt will increase right fine motor coordination required for tying shoes or manipulating small objects by completing 9 hole peg  test in 45" or less.   Goal status: IN PROGRESS  4.  Pt will be educated on AE available to improve success and independence in ADL completion.   Goal status: IN PROGRESS    ASSESSMENT:  CLINICAL IMPRESSION: Pt reports that he has been doing pretty well. Session today focusing on using  right hand as dominant during table building activity and operating tools, manipulating small objects. Increased time required for manipulation. Pt using right hand as assist on tasks that he would typically use as assist. Also completing hand gripper activity increasing resistance on hand gripper. Intermittent cuing for technique or strategy. Pt is making good progress with hand strength and coordination. He is working towards goals and has shown improvement in his grip strength and coordination, as well as increased use of RUE during functional tasks. He has increased his grip strength by 16# and improved his coordination by almost 10" on the 9 hole peg test.   PERFORMANCE DEFICITS: in functional skills including ADLs, IADLs, coordination, proprioception, sensation, tone, strength, Fine motor control, and UE functional use    PLAN:  OT FREQUENCY: 2x/week  OT DURATION: 4 weeks  PLANNED INTERVENTIONS: therapeutic exercise, therapeutic activity, neuromuscular re-education, splinting, electrical stimulation, ultrasound, patient/family education, and DME and/or AE instructions, self-care  RECOMMENDED OTHER SERVICES: PT  CONSULTED AND AGREED WITH PLAN OF CARE: Patient  PLAN FOR NEXT SESSION: Follow up on HEP, grip and pinch strengthening, coordination tasks   Ezra Sites, OTR/L  [phone] 09/04/2023, 11:43 AM

## 2023-09-07 ENCOUNTER — Encounter: Payer: Self-pay | Admitting: Nurse Practitioner

## 2023-09-07 ENCOUNTER — Ambulatory Visit (INDEPENDENT_AMBULATORY_CARE_PROVIDER_SITE_OTHER): Payer: Medicare Other | Admitting: Nurse Practitioner

## 2023-09-07 VITALS — BP 122/84 | HR 55 | Ht 68.0 in | Wt 156.0 lb

## 2023-09-07 DIAGNOSIS — I4891 Unspecified atrial fibrillation: Secondary | ICD-10-CM

## 2023-09-07 DIAGNOSIS — E785 Hyperlipidemia, unspecified: Secondary | ICD-10-CM | POA: Diagnosis not present

## 2023-09-07 DIAGNOSIS — R531 Weakness: Secondary | ICD-10-CM

## 2023-09-07 DIAGNOSIS — Z9889 Other specified postprocedural states: Secondary | ICD-10-CM | POA: Diagnosis not present

## 2023-09-07 DIAGNOSIS — I2510 Atherosclerotic heart disease of native coronary artery without angina pectoris: Secondary | ICD-10-CM

## 2023-09-07 DIAGNOSIS — Z952 Presence of prosthetic heart valve: Secondary | ICD-10-CM

## 2023-09-07 DIAGNOSIS — D500 Iron deficiency anemia secondary to blood loss (chronic): Secondary | ICD-10-CM

## 2023-09-07 DIAGNOSIS — Z8673 Personal history of transient ischemic attack (TIA), and cerebral infarction without residual deficits: Secondary | ICD-10-CM

## 2023-09-07 DIAGNOSIS — Z8679 Personal history of other diseases of the circulatory system: Secondary | ICD-10-CM

## 2023-09-07 DIAGNOSIS — Z85528 Personal history of other malignant neoplasm of kidney: Secondary | ICD-10-CM

## 2023-09-07 DIAGNOSIS — I10 Essential (primary) hypertension: Secondary | ICD-10-CM

## 2023-09-07 DIAGNOSIS — I9789 Other postprocedural complications and disorders of the circulatory system, not elsewhere classified: Secondary | ICD-10-CM

## 2023-09-07 DIAGNOSIS — M6281 Muscle weakness (generalized): Secondary | ICD-10-CM | POA: Diagnosis not present

## 2023-09-07 DIAGNOSIS — R001 Bradycardia, unspecified: Secondary | ICD-10-CM

## 2023-09-07 DIAGNOSIS — I471 Supraventricular tachycardia, unspecified: Secondary | ICD-10-CM

## 2023-09-07 NOTE — Patient Instructions (Addendum)

## 2023-09-07 NOTE — Progress Notes (Unsigned)
Cardiology Office Note:    Date:  06/19/2023 ID:  [name], [name] [birth year], MRN [id number] PCP:  [facility] Providers Cardiologist:  Nona Dell, MD    Referring MD: [facility]  CC: Here for follow-up  History of Present Illness:    [name] is a very delightful 67 y.o. male with a PMH of severe AS due to bicuspid aortic valve, s/p AVR and ascending aortic aneurysm repair on May 18, 2023, CAD, s/p NSTEMI in 2015, HTN, PSVT, HLD, hx of RCC, s/p partial right nephrectomy in 2013.   Had an NSTEMI in 2015, underwent cardiac cath that showed occlusion of RCA filled by collaterals and had unsuccessful PCI, medical management recommended.  History of renal cell carcinoma, s/p partial nephrectomy in 2013.   Intraoperative TEE with EF approximately 50%.  Later in the evening on July 3 was noted to have right hand weakness and rapid response team was called.  Noted to have A-fib with RVR was hypotensive.  Was started on amiodarone infusion, cardiology was consulted.  Transition to amiodarone 200 mg twice daily.  CT of the head without evidence of acute infarct or hemorrhage, however MRI of the brain showed scattered small acute infarcts in left cerebellar hemisphere in both cerebral hemispheres that were likely embolic in etiology.  Neurology was consulted and was felt to be candidate for Coumadin due to new aortic valve as well as postoperative A-fib.  No DVT was noted.  Today he presents for post AVR follow-up.  He states that he is doing well since leaving the hospital. Participating in home health therapy. Does have a deficit, particularly right upper extremity weakness since the stroke, states right leg weakness has been occurring for 2 years since before his stroke.  Has been having some issues with constipation and appetite.  Weight has been stable recently. Denies any chest pain, shortness of breath, palpitations, syncope, presyncope,  dizziness, orthopnea, PND, swelling or significant weight changes, acute bleeding, or claudication.  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   ROS:   Please see the history of present illness.    All other systems reviewed and are negative.  EKGs/Labs/Other Studies Reviewed:    The following studies were reviewed today:  EKG:     - T wave abnormality seen on previous EKG.  . CT angio chest 12/2022: IMPRESSION: 1. Ascending thoracic aortic aneurysm measuring 53 x 50 mm in diameter. Recommend semi-annual imaging followup by CTA or MRA and referral to cardiothoracic surgery if not already obtained. This recommendation follows 2010 ACCF/AHA/AATS/ACR/ASA/SCA/SCAI/SIR/STS/SVM Guidelines for the Diagnosis and Management of Patients With Thoracic Aortic Disease. Circulation. 2010; 121: M578-I696. Aortic aneurysm NOS (ICD10-I71.9) 2. Thickening and calcification of the aortic valve. 3. Coronary artery and Aortic Atherosclerosis (ICD10-I70.0).  Echocardiogram on 11/14/2022:  1. Left ventricular ejection fraction, by estimation, is 55 to 60%. The  left ventricle has normal function. The left ventricle has no regional  wall motion abnormalities. There is mild left ventricular hypertrophy.  Left ventricular diastolic parameters  are consistent with Grade I diastolic dysfunction (impaired relaxation).  The average left ventricular global longitudinal strain is -19.0 %. The  global longitudinal strain is normal.   2. Right ventricular systolic function is normal. The right ventricular  size is normal. Tricuspid regurgitation signal is inadequate for assessing  PA pressure.   3. The mitral valve is normal in structure. Trivial mitral valve  regurgitation. No evidence  of mitral stenosis.   4. The tricuspid valve is abnormal.   5. The aortic valve has an indeterminant number of cusps. Aortic valve  regurgitation is moderate. Severe aortic valve stenosis.   6. Limited  visualization of the ascending aorta, the visualized parts are  at least moderately dilated. Consider additional imaging with CTA or MRA  to better evalute degree and extent of aortic aneurysm. Marland Kitchen Aortic  dilatation noted. There is mild  dilatation of the aortic root, measuring 42 mm. There is moderate  dilatation of the ascending aorta, measuring 48 mm.   7. The inferior vena cava is normal in size with greater than 50%  respiratory variability, suggesting right atrial pressure of 3 mmHg.  Physical Exam:    VS:  There were no vitals taken for this visit.    Wt Readings from Last 3 Encounters:  08/12/23 153 lb (69.4 kg)  07/03/23 163 lb (73.9 kg)  07/01/23 164 lb (74.4 kg)    GEN: Well nourished, well developed in no acute distress HEENT: Normal NECK: No JVD; No carotid bruits CARDIAC: S1/S2, RRR, no murmur, no rubs or gallops noted; 2+ peripheral pulses RESPIRATORY:  Clear to auscultation without rales, wheezing or rhonchi MUSCULOSKELETAL:  No edema; No deformity  SKIN: Warm and dry NEUROLOGIC:  Alert and oriented x 3 PSYCHIATRIC:  Calm and pleasant  ASSESSMENT & PLAN:    In order of problems listed above:  1. Severe aortic stenosis, bicuspid aortic valve, s/p SAVR Doing well post procedure. Working with home health and following sternal precautions well. No medication changes at this time. Discussed SBE prophylaxis, will prescribe Amoxicillin 2,000 mg once PRN 30 - 60 minutes prior to dental procedure. He verbalized understanding. Continue to follow-up at the [facility].   2. CAD, status post NSTEMI in 2015 Denies any anginal symptoms, no indication for ischemic evaluation. Continue aspirin, Lipitor, and Lopressor. Heart healthy diet and regular physical activity as tolerated encouraged.  ED precautions discussed.   3. Thoracic aortic aneurysm, s/p repair CTA of chest revealed ascending thoracic aortic aneurysm measuring 53 x 50 mm. Underwent ascending aortic aneurysm  repair on May 18, 2023 along with TAVR. Doing well. Denies any concerning symptoms. Follow-up with cardiothoracic surgery as scheduled. Care and ED precautions discussed. Heart healthy diet encouraged.   4. Hyperlipidemia Past LDL 75. Continue atorvastatin. Heart healthy diet encouraged.   5. Hypertension Blood pressure today stable. Continue Lopressor.  Heart healthy diet encouraged.   6. PSVT, post-op A-fib, amiodarone monitoring, medication management Denies any palpitations.  Continue Lopressor and Amiodarone. Heart healthy diet encouraged. Will obtain TSH, LFT.    7. Blood loss anemia Some acute blood loss anemia noted surrounding surgery - will recheck CBC in addition to labs as mentioned above.  8. History of RCC, status post partial right nephrectomy in 2013 Denies any issues.  He follows up with nephrology annually.  Continue current medication regimen.  Continue to follow-up with nephrology.    9. Hx of CVA, Right side weakness MRI of the brain showed scattered small acute infarcts in left cerebellar hemisphere in both cerebral hemispheres that were likely embolic in etiology.  Neurology was consulted and was felt to be candidate for Coumadin due to new aortic valve as well as postoperative A-fib. Noted to have right upper extremity weakness after stroke, improving with [facility] therapy, right lower extremity weakness there prior to stroke. Continue HH therapy. Continue to follow with PCP.  10. Disposition: Will refill medication per his request.  Follow-up with me or APP in 3 months or sooner if anything changes.  Medication Adjustments/Labs and Tests Ordered: Current medicines are reviewed at length with the patient today.  Concerns regarding medicines are outlined above.  No orders of the defined types were placed in this encounter.  No orders of the defined types were placed in this encounter.   There are no Patient Instructions on file for this visit.    Signed, Sharlene Dory, NP  09/07/2023 8:34 AM    [facility]

## 2023-09-10 ENCOUNTER — Ambulatory Visit (HOSPITAL_COMMUNITY): Payer: Medicare Other

## 2023-09-10 ENCOUNTER — Encounter (HOSPITAL_COMMUNITY): Payer: Medicare Other | Admitting: Occupational Therapy

## 2023-09-14 ENCOUNTER — Ambulatory Visit (HOSPITAL_COMMUNITY): Payer: Medicare Other | Admitting: Occupational Therapy

## 2023-09-14 ENCOUNTER — Ambulatory Visit (HOSPITAL_COMMUNITY): Payer: Medicare Other

## 2023-09-14 ENCOUNTER — Encounter (HOSPITAL_COMMUNITY): Payer: Self-pay | Admitting: Occupational Therapy

## 2023-09-14 DIAGNOSIS — R278 Other lack of coordination: Secondary | ICD-10-CM

## 2023-09-14 DIAGNOSIS — Z7409 Other reduced mobility: Secondary | ICD-10-CM

## 2023-09-14 DIAGNOSIS — M6281 Muscle weakness (generalized): Secondary | ICD-10-CM | POA: Diagnosis not present

## 2023-09-14 DIAGNOSIS — R29818 Other symptoms and signs involving the nervous system: Secondary | ICD-10-CM

## 2023-09-14 DIAGNOSIS — I639 Cerebral infarction, unspecified: Secondary | ICD-10-CM

## 2023-09-14 NOTE — Therapy (Addendum)
OUTPATIENT PHYSICAL THERAPY NEURO TREATMENT  Patient Name: [name] MRN: [id number] DOB:1956/03/07, 67 y.o., male Today's Date: 09/14/2023  END OF SESSION:   PT End of Session - 09/14/23 1352     Visit Number 12    Number of Visits 16    Date for PT Re-Evaluation 10/08/23    Authorization Type Medicare part A & B    Authorization Time Period no visit limit; no auth    Progress Note Due on Visit 16    Activity Tolerance Patient tolerated treatment well    Behavior During Therapy WFL for tasks assessed/performed               Past Medical History:  Diagnosis Date   Arthritis    Coronary atherosclerosis of native coronary artery    a. NSTEMI (02/2014):  LHC (02/2014):  Mild disease in LAD and CFX; prox RCA occluded with R-R collats, dist RCA filled by L-R collats, inf HK, EF 55%, LVEDP 15 mmHg.  PCI:  Unsuccessful angioplasty of RCA (late presentation of inf MI) - tx medically.   Essential hypertension    GERD (gastroesophageal reflux disease)    Hyperlipidemia    NSTEMI (non-ST elevated myocardial infarction) (HCC) 03/11/14   Renal cell carcinoma of right kidney (HCC)    Partial nephrectomy in 2013   Past Surgical History:  Procedure Laterality Date   AORTIC VALVE REPLACEMENT N/A 05/18/2023   Procedure: AORTIC VALVE REPLACEMENT (AVR) USING INSPIRIS RESILIA 23 MM AORTIC VALVE;  Surgeon: Alleen Borne, MD;  Location: [facility] OR;  Service: Open Heart Surgery;  Laterality: N/A;   BACK SURGERY     1989   CERVICAL DISC SURGERY  04/29/2021   COLONOSCOPY N/A 12/11/2020   Procedure: COLONOSCOPY;  Surgeon: Franky Macho, MD;  Location: [facility] END[facility] SUITE;  Service: Gastroenterology;  Laterality: N/A;   LEFT HEART CATHETERIZATION WITH CORONARY ANGIOGRAM N/A 03/17/2014   Procedure: LEFT HEART CATHETERIZATION WITH CORONARY ANGIOGRAM;  Surgeon: Corky Crafts, MD;  Location: [facility] CATH LAB;  Service: Cardiovascular;  Laterality: N/A;   POLYPECTOMY  12/11/2020   Procedure: POLYPECTOMY;   Surgeon: Franky Macho, MD;  Location: [facility] SUITE;  Service: Gastroenterology;;   REPLACEMENT ASCENDING AORTA N/A 05/18/2023   Procedure: REPLACEMENT OF ASCENDING AORTIC ANEURYSM USING A 30 X 10 MM HEMASHIELD PLATINUM GRAFT;  Surgeon: Alleen Borne, MD;  Location: [facility] OR;  Service: Open Heart Surgery;  Laterality: N/A;  CIRC ARREST   RIGHT HEART CATH AND CORONARY ANGIOGRAPHY N/A 04/03/2023   Procedure: RIGHT HEART CATH AND CORONARY ANGIOGRAPHY;  Surgeon: Tonny Bollman, MD;  Location: [facility] INVASIVE CV LAB;  Service: Cardiovascular;  Laterality: N/A;   ROBOT ASSISTED LAPAROSCOPIC NEPHRECTOMY  01/14/2012   Procedure: ROBOTIC ASSISTED LAPAROSCOPIC NEPHRECTOMY;  Surgeon: Milford Cage, MD;  Location: [facility] ORS;  Service: Urology;  Laterality: Right;  Robot Laparoscopic Right Partial Nephrectomy     TEE WITHOUT CARDIOVERSION N/A 05/18/2023   Procedure: TRANSESOPHAGEAL ECHOCARDIOGRAM;  Surgeon: Alleen Borne, MD;  Location: [facility] OR;  Service: Open Heart Surgery;  Laterality: N/A;   TONSILLECTOMY     Patient Active Problem List   Diagnosis Date Noted   Atrial fibrillation (HCC) 06/08/2023   Encounter for therapeutic drug monitoring 06/08/2023   CVA (cerebral vascular accident) (HCC) 05/25/2023   S/P AVR (aortic valve replacement) 05/18/2023   Chronic cough 02/20/2023   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 [facility]) 02/15/2021   Right leg weakness 02/07/2021   Allodynia  02/07/2021   Gait disturbance 02/07/2021   White matter abnormality on MRI of brain 02/07/2021   Hyperreflexia 02/07/2021   Special screening for malignant neoplasms, colon    Adenomatous polyp of transverse colon    Diverticulosis of large intestine without diverticulitis    PSVT (paroxysmal supraventricular tachycardia) (HCC) 05/29/2014   Precordial pain 04/19/2014   Coronary atherosclerosis of native coronary artery 04/06/2014   Essential hypertension, benign 04/06/2014   HLD  (hyperlipidemia) 04/06/2014      PCP: [facility] REFERRING PROVIDER: Erick Colace, MD  ONSET DATE: 05/18/23  REFERRING DIAG: I63.9 (ICD-10-CM) - Cerebrovascular accident (CVA), unspecified mechanism (HCC)   THERAPY DIAG:  Other lack of coordination  Other symptoms and signs involving the nervous system  Muscle weakness (generalized)  Impaired functional mobility, balance, gait, and endurance  Cerebrovascular accident (CVA), unspecified mechanism (HCC)  Rationale for Evaluation and Treatment: Rehabilitation  SUBJECTIVE:                                                                                                                                                                                             SUBJECTIVE STATEMENT: Patient received in waiting room. PT session   Eval:  Pt went into open heart surgery on 7/1 and had small stroke after surgery on primary RUE. Pt has had leg tests over the past few years regarding nerve conduction and circulation issues. Extensive discussion regarding multiple medical issues surrounding BLE weakness hsitory prior to stroke. Was avid golfer. "Along as I got some hope, I with you all the way."  Pt accompanied by: self  PERTINENT HISTORY:  67 year old male who [facility] to [facility] for a ascending aortic aneurysm repair on 05/18/2023.  Patient with blood loss given treatment with iron replacement.  Later on 7 of 3/24 patient noted with right hand weakness.  Rapid response was called.  Patient was noted to be in atrial fibrillation with RVR and was hypotensive.  MRI showed scattered small acute infarction left cerebral hemisphere in both cerebral hemispheres.  C3 spinal surgery Open heart surgery on 05/18/23.  LBP and BLE weakness Dec'22 to current.   PAIN:  Are you having pain?  Pt reports chronic pain but nothing new.   PRECAUTIONS: No more sternal precautions since end of September 2024       RED  FLAGS: None   WEIGHT BEARING RESTRICTIONS: No  FALLS: Has patient fallen in last 6 months? No  LIVING ENVIRONMENT: Lives with: lives with their family Lives in: House/apartment Stairs:  2 steps in/out, no concerns.  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 Pt had Dme avilable prior to stroke but was not using those  dependently, only when back pain was bad.   PATIENT GOALS: "golf might be a lofty goal"  OBJECTIVE: (all objective findings are from initial evaluation 07/16/23 unless otherwise dated)  DIAGNOSTIC FINDINGS:   IMPRESSION: Scattered small acute infarcts in the left cerebellar hemisphere and both cerebral hemispheres, likely embolic in etiology.  COGNITION: Overall cognitive status: Within functional limits for tasks assessed   SENSATION: WFL  COORDINATION: WFL  POSTURE: No Significant postural limitations  LOWER EXTREMITY ROM:     Active  Right Eval Left Eval  Hip flexion    Hip extension    Hip abduction    Hip adduction    Hip internal rotation    Hip external rotation    Knee flexion    Knee extension    Ankle dorsiflexion    Ankle plantarflexion    Ankle inversion    Ankle eversion     (Blank rows = not tested)  LOWER EXTREMITY MMT:    MMT Right Eval Left Eval Right 07/21/23 Left  07/21/23 Right 08/13/23 Left 08/13/23  Hip flexion   4/5 5/5 4+ 5  Hip extension   3+ Sidelying for sternal precaution 4-/5    Hip abduction   3+ 4/5 4 4   Hip adduction        Hip internal rotation        Hip external rotation        Knee flexion     4 4  Knee extension 3+ 3+   4 4  Ankle dorsiflexion 2+    3+ 4  Ankle plantarflexion     4- 4  Ankle inversion        Ankle eversion        (Blank rows = not tested)     GAIT: 08/13/23 Gait pattern:  decrease R knee flexion on swing phase, decreased step  length- Right, decreased ankle dorsiflexion- Right, and Right foot flat Distance walked: 305 feet Assistive device utilized: Single point cane Level of assistance: SBA Comments: Patient ambulating with steady gait pattern without cueing, ambulating with right flatfoot and sliding through RLE swing.  FUNCTIONAL TESTS:  30 seconds chair stand test:08/13/23 12x from 10x 2 minute walk test: 08/13/23: 305 ft from 121ft  PATIENT SURVEYS:  ABC scale 08/13/23: 48% from 45% impairment.   TODAY'S TREATMENT:                                                                                                                              DATE:  09/14/23 Standing in // bars  Hip abduction, flexion with 2# 2x10  S2S from chair 2x10 Standing   Hi to Lo, Lo to Hi Chops with red theraband + chair behind 2x10     08/26/23 Heel  raises on incline x 20 Heel raises on decline x 20 Sit to stand 3 x 5 reps; last 10 reps with left foot advanced 6" step toe taps x 10  "Gold swings" with 2# dowel 2 x 20 reps with CGA for safety Hip vectors 2 x 5 each side    08/18/23 Leg press 3Pl 2x 10 slow controlled Standing:  Donkey kick 10x 3" holds Hamstring curls 10x Heel raises 20x5"  08/13/23:   Progress note ( , 30 sec stand test, ABC scale, MMT, subjective exam) Reviewed goals and course of rehab with patient  08/06/23:   Gait training with SPC x 226 CGA STS 10x eccentric control no HHA Gait training:    -Heel strike  -stance phase  -knee flexion  -swing phase RW 241ft cueing for knee flexion Heel tap on 3 cones (therapist chose LE and color x1, then 2 and 3 taps before), no HHA with Lt LE, UE support required with Rt LE stance) Squats 2x 10 front of chair Tandem stance 1x 30" on floor, 2x 30" on foam    08/04/23 Ambulation with L quad cane x 250 ft with CGA-SBA Gastrocnemius slant board stretch x 30" x 3 Sit-to-stand x 10 x 2 Hip abd, GTB x 10 x 2 Hip ext, GTB x 10 x 2 Heel taps on cone x  2lbs x 10 x 2 on each Heel raises x 10 x 2 x 2 lbs Toe raises x 10 x 2 x 2 lbs High marches x 10 x 2 x 2 lbs     DGI complete with no AD, SBA for safety 1. Gait level surface (1) Moderate Impairment: Walks 20', slow speed, abnormal gait pattern, evidence for imbalance. 2. Change in gait speed (1) Moderate Impairment: Makes only minor adjustments to walking speed, or accomplishes a change in speed with significant gait deviations, or changes speed but has significant gait deviations, or changes speed but loses balance but is able to recover and continue walking. 3. Gait with horizontal head turns (1) Moderate Impairment: Performs head turns with moderate change in gait velocity, slows down, staggers but recovers, can continue to walk. 4. Gait with vertical head turns (2) Mild Impairment: Performs head turns smoothly with slight change in gait velocity, i.e., minor disruption to smooth gait path or uses walking aid. 5. Gait and pivot turn (1) Moderate Impairment: Turns slowly, requires verbal cueing, requires several small steps to catch balance following turn and stop. 6. Step over obstacle (1) Moderate Impairment: Is able to step over box but must stop, then step over. May require verbal cueing. 7. Step around obstacles (2) Mild Impairment: Is able to step around both cones, but must slow down and adjust steps to clear cones. 8. Stairs (1) Moderate Impairment: Two feet to a stair, must use rail.  TOTAL SCORE: 10 / 24   PATIENT EDUCATION: Education details: 08/13/23: Reviewed goals and course of rehab with patient Person educated: Patient Education method: Explanation Education comprehension: verbalized understanding  HOME EXERCISE PROGRAM: Access Code: [email or web address] URL: [email or web address] Date: 07/21/2023 Prepared by: Becky Sax  Exercises - Supine Bridge  - 2 x daily - 7 x weekly - 1 sets - 10 reps - 5" hold - Sidelying Hip Abduction  - 1 x daily - 7 x  weekly - 3 sets - 10 reps - 3" hold - Sit to Stand Without Arm Support  - 3 x daily - 7 x weekly - 1 sets - 10 reps - Seated Toe Raise  -  3 x daily - 7 x weekly - 1 sets - 10 reps - 5" hold      07/27/23:              - Mini Squat with Counter Support  - 2 x daily - 7 x weekly - 1 sets - 10 reps - 5" hold - Hip Extension with Resistance Loop  - 1 x daily - 7 x weekly - 3 sets - 10 reps - Standing Hip Abduction with Resistance at Ankles and Counter Support  - 1 x daily - 7 x weekly - 3 sets - 10 reps - Standing Tandem Balance with Counter Support  - 1 x daily - 7 x weekly - 3 sets - 10 reps  08/26/23 3 way hip (vectors)  GOALS: Goals reviewed with patient? Yes  SHORT TERM GOALS: Target date: 08/13/2023  Pt and caregiver will be independent with HEP in order to demonstrate participation in Physical Therapy POC.  Baseline: Goal status: IN PROGRESS  2.  Pt will report > 25% improvement in subjective safety/balance concerns in order to demonstrate improved self awareness of balance deficits.  Baseline:  Goal status: NOT MET  LONG TERM GOALS: Target date: 10/08/2023  Pt will increase 30 sec STS score by > 4 reps in order to demonstrate improved functional safety and balance skills in ADL/mobility.   Baseline: 10x Goal status: IN PROGRESS  2.  Pt will improve 2 MWT to >300 with LRAD in order to demonstrate improved functional mobility capacity in community setting.  Baseline: 185 ft with RW Goal status: MET  3.  Pt will improve ABC score by >15%  in order to demonstrate improved perceived balance with functional goals and outcomes. Baseline: See objective Goal status: IN PROGRESS  4.  Pt will report > 50% improvement in subjective safety/balance concerns in order to demonstrate improved self awareness of balance deficits..  Baseline: See objective Goal status: NOT MET  5. Pt will demonstrate increase in LE strength to 4/5 to facilitate ease and safety in  ambulation Baseline:3+/5 Goal status: NEW GOAL   ASSESSMENT:  CLINICAL IMPRESSION: Today's session with focus on lower extremity strengthening and balance. PT introduced chops/lifts to challenge patient's static balance. Patient tolerated well with no increase in pain. Patient will continue to benefit from PT to return to prior level of function       Eval:  Patient is a 67y.o. male who was seen today for physical therapy evaluation and treatment for I63.9 (ICD-10-CM) - Cerebrovascular accident (CVA), unspecified mechanism (HCC).  Pt currently is living with son and grandchildren, with intermittent assistance for ADLs.  Independent with rolling walker for ambulation household.  Supervision ambulation with assistive device and community setting.  Prior to aortic aneurysm repair onset of CVA, patient was having decline in BLE muscle strength due to unknown back reasons, that have gone undiagnosed due to medical issues.  Prior to these events, patient was independent with all aspects of living, avid golfer.. Based upon today's evaluation, pt is demonstrating functional mobility, ambulation, ADL impairments due to new onset of CVA, BLE muscle weakness, balance limitations. Pt would benefit from skilled physical therapy services to address the above impairments/limitations and improve overall functional mobility and quality of life..   OBJECTIVE IMPAIRMENTS: Abnormal gait, decreased balance, decreased mobility, decreased ROM, decreased strength, and hypomobility.   ACTIVITY LIMITATIONS: carrying, lifting, bending, sitting, squatting, and locomotion level  PARTICIPATION LIMITATIONS: driving, community activity, and yard work  PERSONAL FACTORS: 08/13/23 Age  and 1-2 comorbidities: hx of heart surgery  are also affecting patient's functional outcome.   REHAB POTENTIAL: 08/13/23: Fair    CLINICAL DECISION MAKING: Evolving/moderate complexity  EVALUATION COMPLEXITY: Moderate  PLAN:  PT FREQUENCY:  2x/week  PT DURATION: 8 weeks  PLANNED INTERVENTIONS: Therapeutic exercises, Therapeutic activity, Neuromuscular re-education, Balance training, Gait training, Patient/Family education, Self Care, Joint mobilization, Stair training, Orthotic/Fit training, DME instructions, Manual therapy, and Re-evaluation  PLAN FOR NEXT SESSION: Continue POC and may progress as tolerated with emphasis on LE functional strengthening.  May add hamstring strengthening next session. Try  squat and reach activity  4:37 PM, 09/14/23 Seymour Bars PT, DPT  [facility] [place] [phone]

## 2023-09-14 NOTE — Therapy (Signed)
OUTPATIENT OCCUPATIONAL THERAPY NEURO TREATMENT   Patient Name: [name] MRN: [id number] DOB:[birth year], 67 y.o., male Today's Date: 09/14/2023   END OF SESSION:  OT End of Session - 09/14/23 1348     Visit Number 11    Number of Visits 16    Date for OT Re-Evaluation 09/18/23    Authorization Type 1) Medicare A & B  2) [employer]    Progress Note Due on Visit 20    OT Start Time 1305    OT Stop Time 1348    OT Time Calculation (min) 43 min    Activity Tolerance Patient tolerated treatment well    Behavior During Therapy WFL for tasks assessed/performed             Past Medical History:  Diagnosis Date   Arthritis    Coronary atherosclerosis of native coronary artery    a. NSTEMI (02/2014):  LHC (02/2014):  Mild disease in LAD and CFX; prox RCA occluded with R-R collats, dist RCA filled by L-R collats, inf HK, EF 55%, LVEDP 15 mmHg.  PCI:  Unsuccessful angioplasty of RCA (late presentation of inf MI) - tx medically.   Essential hypertension    GERD (gastroesophageal reflux disease)    Hyperlipidemia    NSTEMI (non-ST elevated myocardial infarction) (HCC) 03/11/14   Renal cell carcinoma of right kidney (HCC)    Partial nephrectomy in 2013   Past Surgical History:  Procedure Laterality Date   AORTIC VALVE REPLACEMENT N/A 05/18/2023   Procedure: AORTIC VALVE REPLACEMENT (AVR) USING INSPIRIS RESILIA 23 MM AORTIC VALVE;  Surgeon: Alleen Borne, MD;  Location: [facility] OR;  Service: Open Heart Surgery;  Laterality: N/A;   BACK SURGERY     1989   CERVICAL DISC SURGERY  04/29/2021   COLONOSCOPY N/A 12/11/2020   Procedure: COLONOSCOPY;  Surgeon: Franky Macho, MD;  Location: [facility];  Service: Gastroenterology;  Laterality: N/A;   LEFT HEART CATHETERIZATION WITH CORONARY ANGIOGRAM N/A 03/17/2014   Procedure: LEFT HEART CATHETERIZATION WITH CORONARY ANGIOGRAM;  Surgeon: Corky Crafts, MD;  Location: [facility] CATH LAB;  Service: Cardiovascular;  Laterality: N/A;   POLYPECTOMY   12/11/2020   Procedure: POLYPECTOMY;  Surgeon: Franky Macho, MD;  Location: [facility];  Service: Gastroenterology;;   REPLACEMENT ASCENDING AORTA N/A 05/18/2023   Procedure: REPLACEMENT OF ASCENDING AORTIC ANEURYSM USING A 30 X 10 MM HEMASHIELD PLATINUM GRAFT;  Surgeon: Alleen Borne, MD;  Location: [facility] OR;  Service: Open Heart Surgery;  Laterality: N/A;  [facility] ARREST   RIGHT HEART CATH AND CORONARY ANGIOGRAPHY N/A 04/03/2023   Procedure: RIGHT HEART CATH AND CORONARY ANGIOGRAPHY;  Surgeon: Tonny Bollman, MD;  Location: [facility] INVASIVE CV LAB;  Service: Cardiovascular;  Laterality: N/A;   ROBOT ASSISTED LAPAROSCOPIC NEPHRECTOMY  01/14/2012   Procedure: ROBOTIC ASSISTED LAPAROSCOPIC NEPHRECTOMY;  Surgeon: Milford Cage, MD;  Location: [facility] ORS;  Service: Urology;  Laterality: Right;  Robot Laparoscopic Right Partial Nephrectomy     TEE WITHOUT CARDIOVERSION N/A 05/18/2023   Procedure: TRANSESOPHAGEAL ECHOCARDIOGRAM;  Surgeon: Alleen Borne, MD;  Location: [facility] OR;  Service: Open Heart Surgery;  Laterality: N/A;   TONSILLECTOMY     Patient Active Problem List   Diagnosis Date Noted   Atrial fibrillation (HCC) 06/08/2023   Encounter for therapeutic drug monitoring 06/08/2023   CVA (cerebral vascular accident) (HCC) 05/25/2023   S/P AVR (aortic valve replacement) 05/18/2023   Chronic cough 02/20/2023   Myelopathy concurrent with and due to spinal stenosis  of cervical region [facility]) 02/15/2021   Right leg weakness 02/07/2021   Allodynia 02/07/2021   Gait disturbance 02/07/2021   White matter abnormality on MRI of brain 02/07/2021   Hyperreflexia 02/07/2021   Special screening for malignant neoplasms, colon    Adenomatous polyp of transverse colon    Diverticulosis of large intestine without diverticulitis    PSVT (paroxysmal supraventricular tachycardia) (HCC) 05/29/2014   Precordial pain 04/19/2014   Coronary atherosclerosis of native coronary artery 04/06/2014   Essential  hypertension, benign 04/06/2014   HLD (hyperlipidemia) 04/06/2014   PCP: [name], PA-C REFERRING PROVIDER: Dr. Claudette Laws  ONSET DATE: 05/20/23  REFERRING DIAG: I63.9 (ICD-10-CM) - Cerebrovascular accident (CVA), unspecified mechanism (HCC)   THERAPY DIAG:  Other lack of coordination  Other symptoms and [name] involving the nervous system  Rationale for Evaluation and Treatment: Rehabilitation  SUBJECTIVE:   SUBJECTIVE STATEMENT: S: "I can really tell I'm doing better."   PERTINENT HISTORY: Pt is a 67 year old male who presented to the [facility] for AVR and ascending aortic aneurysm repair on 05/18/2023. He had evidence of bicuspid aortic valve stenosis with 5.1 cm ascending aortic aneurysm and underwent repair by Dr. Laneta Simmers on 05/18/2023. He was extubated later that day. He had brief episode of hematuria after difficult Foley placement. Remains on Proscar for urinary retention. Intraoperative TEE with EF approximately 50%. Pacer wires and chest tubes removed on 7/03. Chest x-ray was stable and he was transferred out to stepdown unit. Acute blood loss anemia treated with oral iron replacement. Aspirin dosing decreased to 81 mg due to post-operative thrombocytopenia. Later in the evening on 7/03 he was noted to have right hand weakness and rapid response was called. He was noted to have atrial fibrillation with RVR and was hypotensive. He was started on an amiodarone infusion and cardiology consulted. He was transition to amiodarone 200 mg twice daily on 7/04. CT of head without evidence of acute infarct or hemorrhage, however brain MRI showed scattered small acute infarcts in the left cerebellar hemisphere in both cerebral hemispheres likely embolic in etiology.   PRECAUTIONS: Sternal and Fall  WEIGHT BEARING RESTRICTIONS: No  PAIN:  Are you having pain? No  FALLS: Has patient fallen in last 6 months? No  LIVING ENVIRONMENT: Lives with: lives with their family Lives in:  House/apartment Stairs: Yes: External: 2 steps; bilateral but cannot reach both Has following equipment at home: Single point cane and Walker - 2 wheeled  PLOF: Independent  PATIENT GOALS: To get stronger.   OBJECTIVE:   HAND DOMINANCE: Right  ADLs: Overall ADLs: Pt reports he requires increased time for ADLs. Is not driving yet. Pt reports meal preparation is difficult due to being fatigued. Picking up items is difficult, eating with right hand is difficult. Has difficulty with manipulating items.   ACTIVITY TOLERANCE: Activity tolerance: Limited due to recent heart sx  FUNCTIONAL OUTCOME MEASURES: FOTO: 63/100 08/13/23: 62.89/100  UPPER EXTREMITY ROM:    BUE A/ROM is [facility]  UPPER EXTREMITY MMT:     MMT Right eval Right 08/13/23  Shoulder flexion 5/5 5/5  Shoulder abduction 5/5 5/5  Shoulder internal rotation 5/5 5/5  Shoulder external rotation 4+/5 4+/5  Elbow flexion 5/5 5/5  Elbow extension 5/5 5/5  Wrist flexion 4/5 4+/5  Wrist extension 5/5 5/5  Wrist ulnar deviation 4/5 5/5  Wrist radial deviation 4/5 4+/5  Wrist pronation 5/5 5/5  Wrist supination 5/5 5/5  (Blank rows = not tested)  HAND FUNCTION: Grip strength:  Right: 30 lbs; Left: 80 lbs, Lateral pinch: Right: 9 lbs, Left: 23 lbs, and 3 point pinch: Right: 3 lbs, Left: 12 lbs 08/13/23: Grip Strength: Right: 46 lbs; Lateral pinch: Right: 12 lbs, 3 Point Pinch: Right: 8lbs  COORDINATION: 9 Hole Peg test: Right: 1' 04 sec; Left: 31.42 sec 08/13/23: 9 Hole Peg test: Right: 51.75 sec  SENSATION: Tingling in hands/feet at baseline  MUSCLE TONE: RUE: Mild    TODAY'S TREATMENT:                                                                                                                              DATE:   09/14/23 -Strengthening: hammer curl, bicep curl, protraction, flexion, abduction, x12 -Wrist Strengthening: 3lb, flexion, extension, ulnar/radial deviation, supination/pronation, x12 -Nuts and bolts:  12 different bolts of different sizes, picking them out according to size and screwing the matching nuts on -Tiny Peg board: using the tweezers to pick up pegs and place them in the board   09/04/23 -Table building: Pt working on assembling small table using provided instructions. Pt using right hand to get hardware out of bag, operate screwdriver and wrench, left hand supporting. Intermittent cuing to use right hand as dominant during task. Increased time for manipulating nuts onto the screws using fingers. Increased time to complete.  -Grip strengthening: large, medium beads at 37#, vertical  08/28/23 -Shoulder strength: shoulder press, bench press (sitting up), curls, shoulder flexion with 3 pound dowel rod 10x each exercise  -theraputty: red putty, pushing 10 beads into putty, roll putty into ball, pinch and pull beads out, roll into ball, squeeze x10, roll into log, tripod pinch x10, lateral pinch x10   PATIENT EDUCATION: Education details: reviewed HEP Person educated: Patient Education method: Programmer, multimedia, Demonstration, and Handouts Education comprehension: verbalized understanding and returned demonstration  HOME EXERCISE PROGRAM: Eval: finger A/ROM 9/5: Finger Rubber Band Exercises 9/20: shoulder strengthening with dumbbells 9/25: Puzzles, Nuts and bolts   GOALS: Goals reviewed with patient? Yes  SHORT TERM GOALS: Target date: 08/15/23  Pt will be provided with and educated on HEP to improve mobility and functional use of right hand during ADLs.   Goal status: IN PROGRESS  2.  Pt will increase right grip strength by 20# and pinch strength by 4# or greater to improve ability to grasp and hold items for hygiene and meal preparation tasks.   Goal status: IN PROGRESS  3.  Pt will increase right fine motor coordination required for tying shoes or manipulating small objects by completing 9 hole peg test in 45" or less.   Goal status: IN PROGRESS  4.  Pt will be educated  on AE available to improve success and independence in ADL completion.   Goal status: IN PROGRESS    ASSESSMENT:  CLINICAL IMPRESSION: Pt continuing to work on his ability to manipulate items and focus on fine motor control. He demonstrated continued difficulty with manipulating small items such as the smaller  nuts and bolts, as well as using tweezers to manipulate tiny pegs. Overall pt feels his strength is improving well, it is just his coordination limiting him. Verbal and tactile cuing provided for positioning and technique throughout session.   PERFORMANCE DEFICITS: in functional skills including ADLs, IADLs, coordination, proprioception, sensation, tone, strength, Fine motor control, and UE functional use    PLAN:  OT FREQUENCY: 2x/week  OT DURATION: 4 weeks  PLANNED INTERVENTIONS: therapeutic exercise, therapeutic activity, neuromuscular re-education, splinting, electrical stimulation, ultrasound, patient/family education, and DME and/or AE instructions, self-care  RECOMMENDED OTHER SERVICES: PT  CONSULTED AND AGREED WITH PLAN OF CARE: Patient  PLAN FOR NEXT SESSION: Follow up on HEP, grip and pinch strengthening, coordination tasks   Trish Mage, OTR/L [phone] 09/14/2023, 3:33 PM

## 2023-09-15 ENCOUNTER — Encounter: Payer: Medicare Other | Attending: Physical Medicine & Rehabilitation | Admitting: Physical Medicine & Rehabilitation

## 2023-09-15 ENCOUNTER — Encounter: Payer: Self-pay | Admitting: Physical Medicine & Rehabilitation

## 2023-09-15 VITALS — BP 157/88 | HR 58 | Ht 68.0 in | Wt 154.0 lb

## 2023-09-15 DIAGNOSIS — I639 Cerebral infarction, unspecified: Secondary | ICD-10-CM | POA: Diagnosis not present

## 2023-09-15 NOTE — Progress Notes (Signed)
Subjective:    Patient ID: [name], male    DOB: 11-15-1956, 67 y.o.   MRN: [id number] Admit date: 05/25/2023 Discharge date: 06/04/2023  67 y.o. male who presented to the [facility] for AVR and ascending aortic aneurysm repair on 05/18/2023. He had evidence of bicuspid aortic valve stenosis with 5.1 cm ascending aortic aneurysm and underwent repair by Dr. Laneta Simmers on 05/18/2023. He was extubated later that day. He had brief episode of hematuria after difficult Foley placement. Remains on Proscar for urinary retention. Intraoperative TEE with EF approximately 50%. Pacer wires and chest tubes removed on 7/03. Chest x-ray was stable and he was transferred out to stepdown unit. Acute blood loss anemia treated with oral iron replacement. Aspirin dosing decreased to 81 mg due to post-operative thrombocytopenia. Later in the evening on 7/03 he was noted to have right hand weakness and rapid response was called. He was noted to have atrial fibrillation with RVR and was hypotensive. He was started on an amiodarone infusion and cardiology consulted. He was transition to amiodarone 200 mg twice daily on 7/04. CT of head without evidence of acute infarct or hemorrhage, however brain MRI showed scattered small acute infarcts in the left cerebellar hemisphere in both cerebral hemispheres likely embolic in etiology. Neurology was consulted. He was felt to be a candidate for Coumadin due to the recent new aortic valve as well as postoperative A-fib. No large vessel occlusion noted. INR goal is 2.0. BLE venous duplex negative for DVT on 7/06. Hemoglobin is stable and platelet count is now normal.  HPI  Started driving again, getting outpt PT, OT   BP elevated this am , long walk from the parking lot   Doing more housekeeping , laundry and dishes  Still sees cardiology   No problems with bowel or bladder reported. He is married lives in a 1 level home with 4 steps to enter.  Pain Inventory Average Pain 0 Pain Right  Now 0 My pain is  none  LOCATION OF PAIN  none  BOWEL Number of stools per week: 5 Oral laxative use Yes   BLADDER Normal    Mobility walk without assistance ability to climb steps?  yes do you drive?  yes Do you have any goals in this area?  yes  Function retired  Neuro/Psych numbness tingling trouble walking  Prior Studies Any changes since last visit?  no  Physicians involved in your care Any changes since last visit?  no   Family History  Problem Relation Age of Onset   Renal Disease Daughter    Hypertension Daughter    Social History   Socioeconomic History   Marital status: Married    Spouse name: Not on file   Number of children: 1   Years of education: 14   Highest education level: Not on file  Occupational History   Occupation: Retired   Tobacco Use   Smoking status: Former    Current packs/day: 0.00    Types: Cigarettes    Quit date: 11/18/1979    Years since quitting: 43.8   Smokeless tobacco: Never  Vaping Use   Vaping status: Never Used  Substance and Sexual Activity   Alcohol use: No    Alcohol/week: 0.0 standard drinks of alcohol   Drug use: Yes    Frequency: 7.0 times per week    Types: Marijuana    Comment: every day   Sexual activity: Not on file  Other Topics Concern   Not on file  Social History Narrative   Lives w/ wife   Caffeine use: 1 cup coffee every morning   Right handed   Social Determinants of Health   Financial Resource Strain: Not on file  Food Insecurity: No Food Insecurity (05/18/2023)   Hunger Vital Sign    Worried About Running Out of Food in the Last Year: Never true    Ran Out of Food in the Last Year: Never true  Transportation Needs: No Transportation Needs (05/18/2023)   PRAPARE - Administrator, Civil Service (Medical): No    Lack of Transportation (Non-Medical): No  Physical Activity: Not on file  Stress: Not on file  Social Connections: Not on file   Past Surgical History:   Procedure Laterality Date   AORTIC VALVE REPLACEMENT N/A 05/18/2023   Procedure: AORTIC VALVE REPLACEMENT (AVR) USING INSPIRIS RESILIA 23 MM AORTIC VALVE;  Surgeon: Alleen Borne, MD;  Location: [facility] OR;  Service: Open Heart Surgery;  Laterality: N/A;   BACK SURGERY     1989   CERVICAL DISC SURGERY  04/29/2021   COLONOSCOPY N/A 12/11/2020   Procedure: COLONOSCOPY;  Surgeon: Franky Macho, MD;  [facility]: [facility];  Service: Gastroenterology;  Laterality: N/A;   LEFT HEART CATHETERIZATION WITH CORONARY ANGIOGRAM N/A 03/17/2014   Procedure: LEFT HEART CATHETERIZATION WITH CORONARY ANGIOGRAM;  Surgeon: Corky Crafts, MD;  Location: [facility] CATH LAB;  Service: Cardiovascular;  Laterality: N/A;   POLYPECTOMY  12/11/2020   Procedure: POLYPECTOMY;  Surgeon: Franky Macho, MD;  [facility]: [facility];  Service: Gastroenterology;;   REPLACEMENT ASCENDING AORTA N/A 05/18/2023   Procedure: REPLACEMENT OF ASCENDING AORTIC ANEURYSM USING A 30 X 10 MM HEMASHIELD PLATINUM GRAFT;  Surgeon: Alleen Borne, MD;  Location: [facility] OR;  Service: Open Heart Surgery;  Laterality: N/A;  CIRC ARREST   RIGHT HEART CATH AND CORONARY ANGIOGRAPHY N/A 04/03/2023   Procedure: RIGHT HEART CATH AND CORONARY ANGIOGRAPHY;  Surgeon: Tonny Bollman, MD;  Location: [facility] INVASIVE CV LAB;  Service: Cardiovascular;  Laterality: N/A;   ROBOT ASSISTED LAPAROSCOPIC NEPHRECTOMY  01/14/2012   Procedure: ROBOTIC ASSISTED LAPAROSCOPIC NEPHRECTOMY;  Surgeon: Milford Cage, MD;  Location: [facility];  Service: Urology;  Laterality: Right;  Robot Laparoscopic Right Partial Nephrectomy     TEE WITHOUT CARDIOVERSION N/A 05/18/2023   Procedure: TRANSESOPHAGEAL ECHOCARDIOGRAM;  Surgeon: Alleen Borne, MD;  Location: [facility] OR;  Service: Open Heart Surgery;  Laterality: N/A;   TONSILLECTOMY     Past Medical History:  Diagnosis Date   Arthritis    Coronary atherosclerosis of native coronary artery    a. NSTEMI (02/2014):  LHC (02/2014):  Mild  disease in LAD and CFX; prox RCA occluded with R-R collats, dist RCA filled by L-R collats, inf HK, EF 55%, LVEDP 15 mmHg.  PCI:  Unsuccessful angioplasty of RCA (late presentation of inf MI) - tx medically.   Essential hypertension    GERD (gastroesophageal reflux disease)    Hyperlipidemia    NSTEMI (non-ST elevated myocardial infarction) (HCC) 03/11/14   Renal cell carcinoma of right kidney (HCC)    Partial nephrectomy in 2013   BP (!) 157/88   Pulse (!) 58   Ht 5\' 8"  (1.727 m)   Wt 154 lb (69.9 kg)   SpO2 100%   BMI 23.42 kg/m   Opioid Risk Score:   Fall Risk Score:  `1  Depression screen [facility] 2/9     09/15/2023   11:45 AM 07/03/2023    1:44 PM  Depression screen PHQ 2/9  Decreased Interest 0 0  Down, Depressed, Hopeless 0 0  PHQ - 2 Score 0 0  Altered sleeping  1  Tired, decreased energy  1  Change in appetite  1  Feeling bad or failure about yourself   0  Trouble concentrating  0  Moving slowly or fidgety/restless  0  Suicidal thoughts  0  PHQ-9 Score  3      Review of Systems  Musculoskeletal:  Positive for gait problem.  All other systems reviewed and are negative.     Objective:   Physical Exam Vitals and nursing note reviewed.  Constitutional:      Appearance: He is normal weight.  HENT:     Head: Normocephalic and atraumatic.  Eyes:     Extraocular Movements: Extraocular movements intact.     Conjunctiva/sclera: Conjunctivae normal.     Pupils: Pupils are equal, round, and reactive to light.  Skin:    General: Skin is warm and dry.  Neurological:     Mental Status: He is alert and oriented to person, place, and time.  Psychiatric:        Mood and Affect: Mood normal.        Behavior: Behavior normal.   4- RUE in the deltoid, bicep, tricep, grip 5/5 in the right hip flexor knee extensor, 3 - ankle dorsiflexor and plantar flexor 5/5 in the left deltoid bicep tricep grip as well as left hip flexor knee extensor ankle dorsiflexor and plantar  flexor. Sensation normal bilateral upper and lower limbs Finger thumb opposition is slowed in the right upper extremity Positive dysdiadochokinesis with rapid alternating supination pronation of the right upper extremity. Mild dysmetria with finger-nose testing on the right side        Assessment & Plan:   1.  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 2.  Right foot drop MRI of the lumbar spine did not show any severe nerve root compression.  Patient does see neurology may need EMG

## 2023-09-17 ENCOUNTER — Encounter (HOSPITAL_COMMUNITY): Payer: Self-pay

## 2023-09-17 ENCOUNTER — Encounter (HOSPITAL_COMMUNITY): Payer: Self-pay | Admitting: Occupational Therapy

## 2023-09-17 ENCOUNTER — Ambulatory Visit (HOSPITAL_COMMUNITY): Payer: Medicare Other | Admitting: Occupational Therapy

## 2023-09-17 ENCOUNTER — Ambulatory Visit (HOSPITAL_COMMUNITY): Payer: Medicare Other

## 2023-09-17 DIAGNOSIS — M6281 Muscle weakness (generalized): Secondary | ICD-10-CM | POA: Diagnosis not present

## 2023-09-17 DIAGNOSIS — R278 Other lack of coordination: Secondary | ICD-10-CM

## 2023-09-17 DIAGNOSIS — R29818 Other symptoms and signs involving the nervous system: Secondary | ICD-10-CM

## 2023-09-17 DIAGNOSIS — Z7409 Other reduced mobility: Secondary | ICD-10-CM

## 2023-09-17 NOTE — Therapy (Signed)
OUTPATIENT PHYSICAL THERAPY NEURO TREATMENT  Patient Name: [name] MRN: [id number] DOB:[birth year], 67 y.o., male Today's Date: 09/17/2023  END OF SESSION:   PT End of Session - 09/17/23 1317     Visit Number 13    Number of Visits 16    Date for PT Re-Evaluation 10/08/23    Authorization Type Medicare part A & B    Authorization Time Period no visit limit; no auth    Progress Note Due on Visit 16    PT Start Time 1147    PT Stop Time 1231    PT Time Calculation (min) 44 min    Equipment Utilized During Treatment Gait belt    Activity Tolerance Patient tolerated treatment well    Behavior During Therapy WFL for tasks assessed/performed             Past Medical History:  Diagnosis Date   Arthritis    Coronary atherosclerosis of native coronary artery    a. NSTEMI (02/2014):  LHC (02/2014):  Mild disease in LAD and CFX; prox RCA occluded with R-R collats, dist RCA filled by L-R collats, inf HK, EF 55%, LVEDP 15 mmHg.  PCI:  Unsuccessful angioplasty of RCA (late presentation of inf MI) - tx medically.   Essential hypertension    GERD (gastroesophageal reflux disease)    Hyperlipidemia    NSTEMI (non-ST elevated myocardial infarction) (HCC) 03/11/14   Renal cell carcinoma of right kidney (HCC)    Partial nephrectomy in 2013   Past Surgical History:  Procedure Laterality Date   AORTIC VALVE REPLACEMENT N/A 05/18/2023   Procedure: AORTIC VALVE REPLACEMENT (AVR) USING INSPIRIS RESILIA 23 MM AORTIC VALVE;  Surgeon: Alleen Borne, MD;  Location: [facility] OR;  Service: Open Heart Surgery;  Laterality: N/A;   BACK SURGERY     1989   CERVICAL DISC SURGERY  04/29/2021   COLONOSCOPY N/A 12/11/2020   Procedure: COLONOSCOPY;  Surgeon: Franky Macho, MD;  Location: [facility];  Service: Gastroenterology;  Laterality: N/A;   LEFT HEART CATHETERIZATION WITH CORONARY ANGIOGRAM N/A 03/17/2014   Procedure: LEFT HEART CATHETERIZATION WITH CORONARY ANGIOGRAM;  Surgeon: Corky Crafts, MD;  Location: [facility] CATH LAB;  Service: Cardiovascular;  Laterality: N/A;   POLYPECTOMY  12/11/2020   Procedure: POLYPECTOMY;  Surgeon: Franky Macho, MD;  Location: [facility];  Service: Gastroenterology;;   REPLACEMENT ASCENDING AORTA N/A 05/18/2023   Procedure: REPLACEMENT OF ASCENDING AORTIC ANEURYSM USING A 30 X 10 MM HEMASHIELD PLATINUM GRAFT;  Surgeon: Alleen Borne, MD;  Location: [facility] OR;  Service: Open Heart Surgery;  Laterality: N/A;  CIRC ARREST   RIGHT HEART CATH AND CORONARY ANGIOGRAPHY N/A 04/03/2023   Procedure: RIGHT HEART CATH AND CORONARY ANGIOGRAPHY;  Surgeon: Tonny Bollman, MD;  Location: [facility] INVASIVE CV LAB;  Service: Cardiovascular;  Laterality: N/A;   ROBOT ASSISTED LAPAROSCOPIC NEPHRECTOMY  01/14/2012   Procedure: ROBOTIC ASSISTED LAPAROSCOPIC NEPHRECTOMY;  Surgeon: Milford Cage, MD;  Location: [facility] ORS;  Service: Urology;  Laterality: Right;  Robot Laparoscopic Right Partial Nephrectomy     TEE WITHOUT CARDIOVERSION N/A 05/18/2023   Procedure: TRANSESOPHAGEAL ECHOCARDIOGRAM;  Surgeon: Alleen Borne, MD;  Location: [facility] OR;  Service: Open Heart Surgery;  Laterality: N/A;   TONSILLECTOMY     Patient Active Problem List   Diagnosis Date Noted   Atrial fibrillation (HCC) 06/08/2023   Encounter for therapeutic drug monitoring 06/08/2023   CVA (cerebral vascular accident) (HCC) 05/25/2023   S/P AVR (aortic valve replacement)  05/18/2023   Chronic cough 02/20/2023   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 [facility]) 02/15/2021   Right leg weakness 02/07/2021   Allodynia 02/07/2021   Gait disturbance 02/07/2021   White matter abnormality on MRI of brain 02/07/2021   Hyperreflexia 02/07/2021   Special screening for malignant neoplasms, colon    Adenomatous polyp of transverse colon    Diverticulosis of large intestine without diverticulitis    PSVT (paroxysmal supraventricular tachycardia) (HCC) 05/29/2014   Precordial pain  04/19/2014   Coronary atherosclerosis of native coronary artery 04/06/2014   Essential hypertension, benign 04/06/2014   HLD (hyperlipidemia) 04/06/2014      PCP: [facility] REFERRING PROVIDER: Erick Colace, MD  ONSET DATE: 05/18/23  REFERRING DIAG: I63.9 (ICD-10-CM) - Cerebrovascular accident (CVA), unspecified mechanism (HCC)   THERAPY DIAG:  Muscle weakness (generalized)  Impaired functional mobility, balance, gait, and endurance  Rationale for Evaluation and Treatment: Rehabilitation  SUBJECTIVE:                                                                                                                                                                                             SUBJECTIVE STATEMENT: Just a little bit of back pain this morning. Received after OT treatment session.  Eval:  Pt went into open heart surgery on 7/1 and had small stroke after surgery on primary RUE. Pt has had leg tests over the past few years regarding nerve conduction and circulation issues. Extensive discussion regarding multiple medical issues surrounding BLE weakness hsitory prior to stroke. Was avid golfer. "Along as I got some hope, I with you all the way."  Pt accompanied by: self  PERTINENT HISTORY:  67 year old male who [facility] ascending aortic aneurysm repair on 05/18/2023.  Patient with blood loss given treatment with iron replacement.  Later on 7 of 3/24 patient noted with right hand weakness.  Rapid response was called.  Patient was noted to be in atrial fibrillation with RVR and was hypotensive.  MRI showed scattered small acute infarction left cerebral hemisphere in both cerebral hemispheres.  C3 spinal surgery Open heart surgery on 05/18/23.  LBP and BLE weakness Dec'22 to current.   PAIN:  Are you having pain?  Pt reports chronic pain but nothing new.   PRECAUTIONS: No more sternal precautions since end of September  2024       RED FLAGS: None   WEIGHT BEARING RESTRICTIONS: No  FALLS: Has patient fallen in last 6 months? No  LIVING ENVIRONMENT: Lives with: lives with their family Lives in: House/apartment Stairs:  2  steps in/out, no concerns.  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 Pt had Dme avilable prior to stroke but was not using those  dependently, only when back pain was bad.   PATIENT GOALS: "golf might be a lofty goal"  OBJECTIVE: (all objective findings are from initial evaluation 07/16/23 unless otherwise dated)  DIAGNOSTIC FINDINGS:   IMPRESSION: Scattered small acute infarcts in the left cerebellar hemisphere and both cerebral hemispheres, likely embolic in etiology.  COGNITION: Overall cognitive status: Within functional limits for tasks assessed   SENSATION: WFL  COORDINATION: WFL  POSTURE: No Significant postural limitations  LOWER EXTREMITY ROM:     Active  Right Eval Left Eval  Hip flexion    Hip extension    Hip abduction    Hip adduction    Hip internal rotation    Hip external rotation    Knee flexion    Knee extension    Ankle dorsiflexion    Ankle plantarflexion    Ankle inversion    Ankle eversion     (Blank rows = not tested)  LOWER EXTREMITY MMT:    MMT Right Eval Left Eval Right 07/21/23 Left  07/21/23 Right 08/13/23 Left 08/13/23  Hip flexion   4/5 5/5 4+ 5  Hip extension   3+ Sidelying for sternal precaution 4-/5    Hip abduction   3+ 4/5 4 4   Hip adduction        Hip internal rotation        Hip external rotation        Knee flexion     4 4  Knee extension 3+ 3+   4 4  Ankle dorsiflexion 2+    3+ 4  Ankle plantarflexion     4- 4  Ankle inversion        Ankle eversion        (Blank rows = not tested)     GAIT: 08/13/23 Gait pattern:  decrease R knee flexion on swing  phase, decreased step length- Right, decreased ankle dorsiflexion- Right, and Right foot flat Distance walked: 305 feet Assistive device utilized: Single point cane Level of assistance: SBA Comments: Patient ambulating with steady gait pattern without cueing, ambulating with right flatfoot and sliding through RLE swing.  FUNCTIONAL TESTS:  30 seconds chair stand test:08/13/23 12x from 10x 2 minute walk test: 08/13/23: 305 ft from 125ft  PATIENT SURVEYS:  ABC scale 08/13/23: 48% from 45% impairment.   TODAY'S TREATMENT:                                                                                                                              DATE: 09/17/2023  -Unilateral dumbbell carry 57ft x 1 for 8lb in RUE; Unilateral dumbbell carry 54ft x 1 with 10lb dumbbell in RUE- CGA for  balance.  -RUE 4in stepovers with 3/4lb ankle weight hanging on R foot with single UE support @ // bars -Squat and hold with cone stacking with RUE with lateral reaching and anterior reaching 2 x 8 CGA for balace.  -4in stepups with 10lb unilateral carry in RUE. CGA for balance   09/14/23 Standing in // bars  Hip abduction, flexion with 2# 2x10  S2S from chair 2x10 Standing   Hi to Lo, Lo to Hi Chops with red theraband + chair behind 2x10   08/26/23 Heel raises on incline x 20 Heel raises on decline x 20 Sit to stand 3 x 5 reps; last 10 reps with left foot advanced 6" step toe taps x 10  "Gold swings" with 2# dowel 2 x 20 reps with CGA for safety Hip vectors 2 x 5 each side  PATIENT EDUCATION: Education details: 08/13/23: Reviewed goals and course of rehab with patient Person educated: Patient Education method: Explanation Education comprehension: verbalized understanding  HOME EXERCISE PROGRAM: Access Code: [email or web address] URL: [email or web address] Date: 07/21/2023 Prepared by: Becky Sax  Exercises - Supine Bridge  - 2 x daily - 7 x weekly - 1 sets - 10 reps - 5" hold - Sidelying  Hip Abduction  - 1 x daily - 7 x weekly - 3 sets - 10 reps - 3" hold - Sit to Stand Without Arm Support  - 3 x daily - 7 x weekly - 1 sets - 10 reps - Seated Toe Raise  - 3 x daily - 7 x weekly - 1 sets - 10 reps - 5" hold      07/27/23:              - Mini Squat with Counter Support  - 2 x daily - 7 x weekly - 1 sets - 10 reps - 5" hold - Hip Extension with Resistance Loop  - 1 x daily - 7 x weekly - 3 sets - 10 reps - Standing Hip Abduction with Resistance at Ankles and Counter Support  - 1 x daily - 7 x weekly - 3 sets - 10 reps - Standing Tandem Balance with Counter Support  - 1 x daily - 7 x weekly - 3 sets - 10 reps  08/26/23 3 way hip (vectors)  GOALS: Goals reviewed with patient? Yes  SHORT TERM GOALS: Target date: 08/13/2023  Pt and caregiver will be independent with HEP in order to demonstrate participation in Physical Therapy POC.  Baseline: Goal status: IN PROGRESS  2.  Pt will report > 25% improvement in subjective safety/balance concerns in order to demonstrate improved self awareness of balance deficits.  Baseline:  Goal status: NOT MET  LONG TERM GOALS: Target date: 10/08/2023  Pt will increase 30 sec STS score by > 4 reps in order to demonstrate improved functional safety and balance skills in ADL/mobility.   Baseline: 10x Goal status: IN PROGRESS  2.  Pt will improve 2 MWT to >300 with LRAD in order to demonstrate improved functional mobility capacity in community setting.  Baseline: 185 ft with RW Goal status: MET  3.  Pt will improve ABC score by >15%  in order to demonstrate improved perceived balance with functional goals and outcomes. Baseline: See objective Goal status: IN PROGRESS  4.  Pt will report > 50% improvement in subjective safety/balance concerns in order to demonstrate improved self awareness of balance deficits..  Baseline: See objective Goal status: NOT MET  5. Pt will demonstrate increase in LE strength  to 4/5 to facilitate ease and safety  in ambulation Baseline:3+/5 Goal status: NEW GOAL   ASSESSMENT:  CLINICAL IMPRESSION: Pt tolerating session well. Today's session focused on functional previous work activities while maintaining proper balance and reaction training. Pt continues to show limited R ankle mobility in setting of muscle weakness, which limits balance reactions, proper gait mechanics and poor reaction timing indicating need for CGA during dynamic activities. Tolerance and strength is continuing to improve but needs further training specifically throughout distal RLE. Patient will continue to benefit from PT to return to prior level of function     OBJECTIVE IMPAIRMENTS: Abnormal gait, decreased balance, decreased mobility, decreased ROM, decreased strength, and hypomobility.   ACTIVITY LIMITATIONS: carrying, lifting, bending, sitting, squatting, and locomotion level  PARTICIPATION LIMITATIONS: driving, community activity, and yard work  PERSONAL FACTORS: 08/13/23 Age and 1-2 comorbidities: hx of heart surgery  are also affecting patient's functional outcome.   REHAB POTENTIAL: 08/13/23: Fair    CLINICAL DECISION MAKING: Evolving/moderate complexity  EVALUATION COMPLEXITY: Moderate  PLAN:  PT FREQUENCY: 2x/week  PT DURATION: 8 weeks  PLANNED INTERVENTIONS: Therapeutic exercises, Therapeutic activity, Neuromuscular re-education, Balance training, Gait training, Patient/Family education, Self Care, Joint mobilization, Stair training, Orthotic/Fit training, DME instructions, Manual therapy, and Re-evaluation  PLAN FOR NEXT SESSION: Continue POC and may progress as tolerated with emphasis on LE functional strengthening.  May add hamstring strengthening next session. Try  squat and reach activity  1:18 PM, 09/17/23 Nelida Meuse PT, DPT Physical Therapist with [facility] [phone] office

## 2023-09-17 NOTE — Therapy (Signed)
OUTPATIENT OCCUPATIONAL THERAPY NEURO TREATMENT AND REASSESSMENT/RECERTIFICATION  Patient Name: [name] MRN: [id number] DOB:1956-06-30, 67 y.o., male Today's Date: 09/17/2023   END OF SESSION:  OT End of Session - 09/17/23 1145     Visit Number 12    Number of Visits 16    Date for OT Re-Evaluation 10/16/23    Authorization Type 1) Medicare A & B  2) [employer]    Progress Note Due on Visit 20    OT Start Time 1106    OT Stop Time 1145    OT Time Calculation (min) 39 min    Activity Tolerance Patient tolerated treatment well    Behavior During Therapy WFL for tasks assessed/performed              Past Medical History:  Diagnosis Date   Arthritis    Coronary atherosclerosis of native coronary artery    a. NSTEMI (02/2014):  LHC (02/2014):  Mild disease in LAD and CFX; prox RCA occluded with R-R collats, dist RCA filled by L-R collats, inf HK, EF 55%, LVEDP 15 mmHg.  PCI:  Unsuccessful angioplasty of RCA (late presentation of inf MI) - tx medically.   Essential hypertension    GERD (gastroesophageal reflux disease)    Hyperlipidemia    NSTEMI (non-ST elevated myocardial infarction) (HCC) 03/11/14   Renal cell carcinoma of right kidney (HCC)    Partial nephrectomy in 2013   Past Surgical History:  Procedure Laterality Date   AORTIC VALVE REPLACEMENT N/A 05/18/2023   Procedure: AORTIC VALVE REPLACEMENT (AVR) USING INSPIRIS RESILIA 23 MM AORTIC VALVE;  Surgeon: Alleen Borne, MD;  Location: [facility] OR;  Service: Open Heart Surgery;  Laterality: N/A;   BACK SURGERY     1989   CERVICAL DISC SURGERY  04/29/2021   COLONOSCOPY N/A 12/11/2020   Procedure: COLONOSCOPY;  Surgeon: Franky Macho, MD;  Location: [facility];  Service: Gastroenterology;  Laterality: N/A;   LEFT HEART CATHETERIZATION WITH CORONARY ANGIOGRAM N/A 03/17/2014   Procedure: LEFT HEART CATHETERIZATION WITH CORONARY ANGIOGRAM;  Surgeon: Corky Crafts, MD;  Location: [facility] CATH LAB;  Service:  Cardiovascular;  Laterality: N/A;   POLYPECTOMY  12/11/2020   Procedure: POLYPECTOMY;  Surgeon: Franky Macho, MD;  Location: [facility];  Service: Gastroenterology;;   REPLACEMENT ASCENDING AORTA N/A 05/18/2023   Procedure: REPLACEMENT OF ASCENDING AORTIC ANEURYSM USING A 30 X 10 MM HEMASHIELD PLATINUM GRAFT;  Surgeon: Alleen Borne, MD;  Location: [facility] OR;  Service: Open Heart Surgery;  Laterality: N/A;  CIRC ARREST   RIGHT HEART CATH AND CORONARY ANGIOGRAPHY N/A 04/03/2023   Procedure: RIGHT HEART CATH AND CORONARY ANGIOGRAPHY;  Surgeon: Tonny Bollman, MD;  Location: [facility] INVASIVE CV LAB;  Service: Cardiovascular;  Laterality: N/A;   ROBOT ASSISTED LAPAROSCOPIC NEPHRECTOMY  01/14/2012   Procedure: ROBOTIC ASSISTED LAPAROSCOPIC NEPHRECTOMY;  Surgeon: Milford Cage, MD;  Location: [facility] ORS;  Service: Urology;  Laterality: Right;  Robot Laparoscopic Right Partial Nephrectomy     TEE WITHOUT CARDIOVERSION N/A 05/18/2023   Procedure: TRANSESOPHAGEAL ECHOCARDIOGRAM;  Surgeon: Alleen Borne, MD;  Location: [facility] OR;  Service: Open Heart Surgery;  Laterality: N/A;   TONSILLECTOMY     Patient Active Problem List   Diagnosis Date Noted   Atrial fibrillation (HCC) 06/08/2023   Encounter for therapeutic drug monitoring 06/08/2023   CVA (cerebral vascular accident) (HCC) 05/25/2023   S/P AVR (aortic valve replacement) 05/18/2023   Chronic cough 02/20/2023   Myelopathy concurrent with and due to  spinal stenosis of cervical region [facility]) 02/15/2021   Right leg weakness 02/07/2021   Allodynia 02/07/2021   Gait disturbance 02/07/2021   White matter abnormality on MRI of brain 02/07/2021   Hyperreflexia 02/07/2021   Special screening for malignant neoplasms, colon    Adenomatous polyp of transverse colon    Diverticulosis of large intestine without diverticulitis    PSVT (paroxysmal supraventricular tachycardia) (HCC) 05/29/2014   Precordial pain 04/19/2014   Coronary atherosclerosis of native  coronary artery 04/06/2014   Essential hypertension, benign 04/06/2014   HLD (hyperlipidemia) 04/06/2014   PCP: [name], PA-C REFERRING PROVIDER: Dr. Claudette Laws  ONSET DATE: 05/20/23  REFERRING DIAG: I63.9 (ICD-10-CM) - Cerebrovascular accident (CVA), unspecified mechanism (HCC)   THERAPY DIAG:  Other lack of coordination  Other symptoms and signs involving the nervous system  Rationale for Evaluation and Treatment: Rehabilitation  SUBJECTIVE:   SUBJECTIVE STATEMENT: S: "Still things I need to work on."   PERTINENT HISTORY: Pt is a 67 year old male who presented to the [facility] for AVR and ascending aortic aneurysm repair on 05/18/2023. He had evidence of bicuspid aortic valve stenosis with 5.1 cm ascending aortic aneurysm and underwent repair by Dr. Laneta Simmers on 05/18/2023. He was extubated later that day. He had brief episode of hematuria after difficult Foley placement. Remains on Proscar for urinary retention. Intraoperative TEE with EF approximately 50%. Pacer wires and chest tubes removed on 7/03. Chest x-ray was stable and he was transferred out to stepdown unit. Acute blood loss anemia treated with oral iron replacement. Aspirin dosing decreased to 81 mg due to post-operative thrombocytopenia. Later in the evening on 7/03 he was noted to have right hand weakness and rapid response was called. He was noted to have atrial fibrillation with RVR and was hypotensive. He was started on an amiodarone infusion and cardiology consulted. He was transition to amiodarone 200 mg twice daily on 7/04. CT of head without evidence of acute infarct or hemorrhage, however brain MRI showed scattered small acute infarcts in the left cerebellar hemisphere in both cerebral hemispheres likely embolic in etiology.   PRECAUTIONS: Sternal and Fall  WEIGHT BEARING RESTRICTIONS: No  PAIN:  Are you having pain? No  FALLS: Has patient fallen in last 6 months? No  LIVING ENVIRONMENT: Lives with: lives  with their family Lives in: House/apartment Stairs: Yes: External: 2 steps; bilateral but cannot reach both Has following equipment at home: Single point cane and Walker - 2 wheeled  PLOF: Independent  PATIENT GOALS: To get stronger.   OBJECTIVE:   HAND DOMINANCE: Right  ADLs: Overall ADLs: Pt reports he requires increased time for ADLs. Is not driving yet. Pt reports meal preparation is difficult due to being fatigued. Picking up items is difficult, eating with right hand is difficult. Has difficulty with manipulating items.   ACTIVITY TOLERANCE: Activity tolerance: Limited due to recent heart sx  FUNCTIONAL OUTCOME MEASURES: FOTO: 63/100 08/13/23: 62.89/100 09/17/23: 69.43  UPPER EXTREMITY ROM:    BUE A/ROM is [facility]  UPPER EXTREMITY MMT:     MMT Right eval Right 08/13/23 Right 09/17/23  Shoulder flexion 5/5 5/5 5/5  Shoulder abduction 5/5 5/5 5/5  Shoulder internal rotation 5/5 5/5 5/5  Shoulder external rotation 4+/5 4+/5 5/5  Elbow flexion 5/5 5/5 5/5  Elbow extension 5/5 5/5 5/5  Wrist flexion 4/5 4+/5 5/5  Wrist extension 5/5 5/5 5/5  Wrist ulnar deviation 4/5 5/5 5/5  Wrist radial deviation 4/5 4+/5 5/5  Wrist pronation 5/5 5/5  5/5  Wrist supination 5/5 5/5 5/5  (Blank rows = not tested)  HAND FUNCTION: Grip strength: Right: 30 lbs; Left: 80 lbs, Lateral pinch: Right: 9 lbs, Left: 23 lbs, and 3 point pinch: Right: 3 lbs, Left: 12 lbs 08/13/23: Grip Strength: Right: 46 lbs; Lateral pinch: Right: 12 lbs, 3 Point Pinch: Right: 8lbs 09/17/23: Grip Strength: Right: 51 lbs; Lateral pinch: Right: 13 lbs, 3 Point Pinch: Right: 6lbs  COORDINATION: 9 Hole Peg test: Right: 1' 04 sec; Left: 31.42 sec 08/13/23: 9 Hole Peg test: Right: 51.75 sec 09/17/23: 9 Hole Peg test: Right: 56.28 sec  SENSATION: Tingling in hands/feet at baseline  MUSCLE TONE: RUE: Mild    TODAY'S TREATMENT:                                                                                                                               DATE:   09/17/23 -Measurements for reassessments -Grooved peg board: using fingers only to manipulate the pegs, not using the box. Taking 5 out at a time (max difficulty).   09/14/23 -Strengthening: hammer curl, bicep curl, protraction, flexion, abduction, x12 -Wrist Strengthening: 3lb, flexion, extension, ulnar/radial deviation, supination/pronation, x12 -Nuts and bolts: 12 different bolts of different sizes, picking them out according to size and screwing the matching nuts on -Tiny Peg board: using the tweezers to pick up pegs and place them in the board   09/04/23 -Table building: Pt working on assembling small table using provided instructions. Pt using right hand to get hardware out of bag, operate screwdriver and wrench, left hand supporting. Intermittent cuing to use right hand as dominant during task. Increased time for manipulating nuts onto the screws using fingers. Increased time to complete.  -Grip strengthening: large, medium beads at 37#, vertical   PATIENT EDUCATION: Education details: reviewed HEP Person educated: Patient Education method: Explanation, Demonstration, and Handouts Education comprehension: verbalized understanding and returned demonstration  HOME EXERCISE PROGRAM: Eval: finger A/ROM 9/5: Finger Rubber Band Exercises 9/20: shoulder strengthening with dumbbells 9/25: Puzzles, Nuts and bolts   GOALS: Goals reviewed with patient? Yes  SHORT TERM GOALS: Target date: 08/15/23  Pt will be provided with and educated on HEP to improve mobility and functional use of right hand during ADLs.   Goal status: IN PROGRESS  2.  Pt will increase right grip strength by 20# and pinch strength by 4# or greater to improve ability to grasp and hold items for hygiene and meal preparation tasks.   Goal status: IN PROGRESS  3.  Pt will increase right fine motor coordination required for tying shoes or manipulating small objects by  completing 9 hole peg test in 45" or less.   Goal status: IN PROGRESS  4.  Pt will be educated on AE available to improve success and independence in ADL completion.   Goal status: IN PROGRESS    ASSESSMENT:  CLINICAL IMPRESSION: Pt completed his reassessment for recertification. Pt demonstrating great improvement with  overall strength, as well as small improvements with his grip and pinch strength. Pt continues to have increased difficulty with coordination with slightly slower time during the 9 hole peg test. Remainder of session focused on coordination, where he required increased time with the grooved peg board as he had max difficulty manipulating the pegs without using the board to help manipulate them. OT recommending pt continue OT for 1x per week for 4 weeks. Verbal and tactile cuing for positioning and technique.   PERFORMANCE DEFICITS: in functional skills including ADLs, IADLs, coordination, proprioception, sensation, tone, strength, Fine motor control, and UE functional use    PLAN:  OT FREQUENCY: 2x/week  OT DURATION: 4 weeks  PLANNED INTERVENTIONS: therapeutic exercise, therapeutic activity, neuromuscular re-education, splinting, electrical stimulation, ultrasound, patient/family education, and DME and/or AE instructions, self-care  RECOMMENDED OTHER SERVICES: PT  CONSULTED AND AGREED WITH PLAN OF CARE: Patient  PLAN FOR NEXT SESSION: Follow up on HEP, grip and pinch strengthening, coordination tasks   Trish Mage, OTR/L [phone] 09/17/2023, 12:08 PM

## 2023-09-21 ENCOUNTER — Ambulatory Visit (INDEPENDENT_AMBULATORY_CARE_PROVIDER_SITE_OTHER): Payer: Medicare Other | Admitting: *Deleted

## 2023-09-21 ENCOUNTER — Ambulatory Visit: Payer: Medicare Other | Admitting: Nurse Practitioner

## 2023-09-21 ENCOUNTER — Ambulatory Visit (HOSPITAL_COMMUNITY): Payer: Medicare Other

## 2023-09-21 ENCOUNTER — Ambulatory Visit (HOSPITAL_COMMUNITY): Payer: Medicare Other | Attending: Physical Medicine & Rehabilitation | Admitting: Occupational Therapy

## 2023-09-21 ENCOUNTER — Encounter (HOSPITAL_COMMUNITY): Payer: Self-pay | Admitting: Occupational Therapy

## 2023-09-21 DIAGNOSIS — R278 Other lack of coordination: Secondary | ICD-10-CM

## 2023-09-21 DIAGNOSIS — R29818 Other symptoms and signs involving the nervous system: Secondary | ICD-10-CM | POA: Diagnosis present

## 2023-09-21 DIAGNOSIS — Z5181 Encounter for therapeutic drug level monitoring: Secondary | ICD-10-CM | POA: Insufficient documentation

## 2023-09-21 DIAGNOSIS — Z952 Presence of prosthetic heart valve: Secondary | ICD-10-CM | POA: Insufficient documentation

## 2023-09-21 DIAGNOSIS — M6281 Muscle weakness (generalized): Secondary | ICD-10-CM | POA: Diagnosis present

## 2023-09-21 DIAGNOSIS — I480 Paroxysmal atrial fibrillation: Secondary | ICD-10-CM | POA: Insufficient documentation

## 2023-09-21 DIAGNOSIS — I639 Cerebral infarction, unspecified: Secondary | ICD-10-CM | POA: Insufficient documentation

## 2023-09-21 DIAGNOSIS — Z7409 Other reduced mobility: Secondary | ICD-10-CM

## 2023-09-21 LAB — POCT INR: INR: 1.7 — AB (ref 2.0–3.0)

## 2023-09-21 NOTE — Addendum Note (Signed)
Addended by: Trish Mage E on: 09/21/2023 08:50 AM   Modules accepted: Orders

## 2023-09-21 NOTE — Therapy (Signed)
OUTPATIENT PHYSICAL THERAPY NEURO TREATMENT  Patient Name: [name] MRN: [id number] DOB:[birth year], 67 y.o., male Today's Date: 09/21/2023  END OF SESSION:   PT End of Session - 09/21/23 1107     Visit Number 14    Number of Visits 16    Date for PT Re-Evaluation 10/08/23    Authorization Type Medicare part A & B    Authorization Time Period no visit limit; no auth    Progress Note Due on Visit 16    PT Start Time 1104    PT Stop Time 1145    PT Time Calculation (min) 41 min    Equipment Utilized During Treatment Gait belt    Activity Tolerance Patient tolerated treatment well    Behavior During Therapy WFL for tasks assessed/performed             Past Medical History:  Diagnosis Date   Arthritis    Coronary atherosclerosis of native coronary artery    a. NSTEMI (02/2014):  LHC (02/2014):  Mild disease in LAD and CFX; prox RCA occluded with R-R collats, dist RCA filled by L-R collats, inf HK, EF 55%, LVEDP 15 mmHg.  PCI:  Unsuccessful angioplasty of RCA (late presentation of inf MI) - tx medically.   Essential hypertension    GERD (gastroesophageal reflux disease)    Hyperlipidemia    NSTEMI (non-ST elevated myocardial infarction) (HCC) 03/11/14   Renal cell carcinoma of right kidney (HCC)    Partial nephrectomy in 2013   Past Surgical History:  Procedure Laterality Date   AORTIC VALVE REPLACEMENT N/A 05/18/2023   Procedure: AORTIC VALVE REPLACEMENT (AVR) USING INSPIRIS RESILIA 23 MM AORTIC VALVE;  Surgeon: Alleen Borne, MD;  Location: [facility] OR;  Service: Open Heart Surgery;  Laterality: N/A;   BACK SURGERY     1989   CERVICAL DISC SURGERY  04/29/2021   COLONOSCOPY N/A 12/11/2020   Procedure: COLONOSCOPY;  Surgeon: Franky Macho, MD;  Location: [facility];  Service: Gastroenterology;  Laterality: N/A;   LEFT HEART CATHETERIZATION WITH CORONARY ANGIOGRAM N/A 03/17/2014   Procedure: LEFT HEART CATHETERIZATION WITH CORONARY ANGIOGRAM;  Surgeon: Corky Crafts,  MD;  Location: [facility] CATH LAB;  Service: Cardiovascular;  Laterality: N/A;   POLYPECTOMY  12/11/2020   Procedure: POLYPECTOMY;  Surgeon: Franky Macho, MD;  Location: [facility];  Service: Gastroenterology;;   REPLACEMENT ASCENDING AORTA N/A 05/18/2023   Procedure: REPLACEMENT OF ASCENDING AORTIC ANEURYSM USING A 30 X 10 MM HEMASHIELD PLATINUM GRAFT;  Surgeon: Alleen Borne, MD;  Location: [facility] OR;  Service: Open Heart Surgery;  Laterality: N/A;  CIRC ARREST   RIGHT HEART CATH AND CORONARY ANGIOGRAPHY N/A 04/03/2023   Procedure: RIGHT HEART CATH AND CORONARY ANGIOGRAPHY;  Surgeon: Tonny Bollman, MD;  Location: [facility] INVASIVE CV LAB;  Service: Cardiovascular;  Laterality: N/A;   ROBOT ASSISTED LAPAROSCOPIC NEPHRECTOMY  01/14/2012   Procedure: ROBOTIC ASSISTED LAPAROSCOPIC NEPHRECTOMY;  Surgeon: Milford Cage, MD;  Location: [facility] ORS;  Service: Urology;  Laterality: Right;  Robot Laparoscopic Right Partial Nephrectomy     TEE WITHOUT CARDIOVERSION N/A 05/18/2023   Procedure: TRANSESOPHAGEAL ECHOCARDIOGRAM;  Surgeon: Alleen Borne, MD;  Location: [facility] OR;  Service: Open Heart Surgery;  Laterality: N/A;   TONSILLECTOMY     Patient Active Problem List   Diagnosis Date Noted   Atrial fibrillation (HCC) 06/08/2023   Encounter for therapeutic drug monitoring 06/08/2023   CVA (cerebral vascular accident) (HCC) 05/25/2023   S/P AVR (aortic valve replacement)  05/18/2023   Chronic cough 02/20/2023   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 [facility]) 02/15/2021   Right leg weakness 02/07/2021   Allodynia 02/07/2021   Gait disturbance 02/07/2021   White matter abnormality on MRI of brain 02/07/2021   Hyperreflexia 02/07/2021   Special screening for malignant neoplasms, colon    Adenomatous polyp of transverse colon    Diverticulosis of large intestine without diverticulitis    PSVT (paroxysmal supraventricular tachycardia) (HCC) 05/29/2014   Precordial pain 04/19/2014    Coronary atherosclerosis of native coronary artery 04/06/2014   Essential hypertension, benign 04/06/2014   HLD (hyperlipidemia) 04/06/2014      PCP: [facility] REFERRING PROVIDER: Erick Colace, MD  ONSET DATE: 05/18/23  REFERRING DIAG: I63.9 (ICD-10-CM) - Cerebrovascular accident (CVA), unspecified mechanism (HCC)   THERAPY DIAG:  Muscle weakness (generalized)  Impaired functional mobility, balance, gait, and endurance  Other lack of coordination  Other symptoms and signs involving the nervous system  Cerebrovascular accident (CVA), unspecified mechanism (HCC)  Rationale for Evaluation and Treatment: Rehabilitation  SUBJECTIVE:                                                                                                                                                                                             SUBJECTIVE STATEMENT: "Bad back day"; had OT prior to PT session today.  8/10  Eval:  Pt went into open heart surgery on 7/1 and had small stroke after surgery on primary RUE. Pt has had leg tests over the past few years regarding nerve conduction and circulation issues. Extensive discussion regarding multiple medical issues surrounding BLE weakness hsitory prior to stroke. Was avid golfer. "Along as I got some hope, I with you all the way."  Pt accompanied by: self  PERTINENT HISTORY:  67 year old male who [facility] a ascending aortic aneurysm repair on 05/18/2023.  Patient with blood loss given treatment with iron replacement.  Later on 7 of 3/24 patient noted with right hand weakness.  Rapid response was called.  Patient was noted to be in atrial fibrillation with RVR and was hypotensive.  MRI showed scattered small acute infarction left cerebral hemisphere in both cerebral hemispheres.  C3 spinal surgery Open heart surgery on 05/18/23.  LBP and BLE weakness Dec'22 to current.   PAIN:  Are you having pain?  Pt reports  chronic pain but nothing new.   PRECAUTIONS: No more sternal precautions since end of September 2024       RED FLAGS: None   WEIGHT BEARING RESTRICTIONS: No  FALLS: Has patient fallen in  last 6 months? No  LIVING ENVIRONMENT: Lives with: lives with their family Lives in: House/apartment Stairs:  2 steps in/out, no concerns.  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 Pt had Dme avilable prior to stroke but was not using those  dependently, only when back pain was bad.   PATIENT GOALS: "golf might be a lofty goal"  OBJECTIVE: (all objective findings are from initial evaluation 07/16/23 unless otherwise dated)  DIAGNOSTIC FINDINGS:   IMPRESSION: Scattered small acute infarcts in the left cerebellar hemisphere and both cerebral hemispheres, likely embolic in etiology.  COGNITION: Overall cognitive status: Within functional limits for tasks assessed   SENSATION: WFL  COORDINATION: WFL  POSTURE: No Significant postural limitations  LOWER EXTREMITY ROM:     Active  Right Eval Left Eval  Hip flexion    Hip extension    Hip abduction    Hip adduction    Hip internal rotation    Hip external rotation    Knee flexion    Knee extension    Ankle dorsiflexion    Ankle plantarflexion    Ankle inversion    Ankle eversion     (Blank rows = not tested)  LOWER EXTREMITY MMT:    MMT Right Eval Left Eval Right 07/21/23 Left  07/21/23 Right 08/13/23 Left 08/13/23  Hip flexion   4/5 5/5 4+ 5  Hip extension   3+ Sidelying for sternal precaution 4-/5    Hip abduction   3+ 4/5 4 4   Hip adduction        Hip internal rotation        Hip external rotation        Knee flexion     4 4  Knee extension 3+ 3+   4 4  Ankle dorsiflexion 2+    3+ 4  Ankle plantarflexion     4- 4  Ankle inversion        Ankle eversion         (Blank rows = not tested)     GAIT: 08/13/23 Gait pattern:  decrease R knee flexion on swing phase, decreased step length- Right, decreased ankle dorsiflexion- Right, and Right foot flat Distance walked: 305 feet Assistive device utilized: Single point cane Level of assistance: SBA Comments: Patient ambulating with steady gait pattern without cueing, ambulating with right flatfoot and sliding through RLE swing.  FUNCTIONAL TESTS:  30 seconds chair stand test:08/13/23 12x from 10x 2 minute walk test: 08/13/23: 305 ft from 14ft  PATIENT SURVEYS:  ABC scale 08/13/23: 48% from 45% impairment.   TODAY'S TREATMENT:                                                                                                                              DATE: 09/21/23 Seated Moist heat low back  x 5 to decrease pain and increase soft tissue extensibility in low back Supine  Single knee to chest AAROM x 8 Hip internal external rotation stretch and contract relax bilaterally x 10 each Right knee SAQ's x 10 Pelvic tilt x 8 Bridge x 8  09/17/2023  -Unilateral dumbbell carry 74ft x 1 for 8lb in RUE; Unilateral dumbbell carry 68ft x 1 with 10lb dumbbell in RUE- CGA for balance.  -RUE 4in stepovers with 3/4lb ankle weight hanging on R foot with single UE support @ // bars -Squat and hold with cone stacking with RUE with lateral reaching and anterior reaching 2 x 8 CGA for balace.  -4in stepups with 10lb unilateral carry in RUE. CGA for balance   09/14/23 Standing in // bars  Hip abduction, flexion with 2# 2x10  S2S from chair 2x10 Standing   Hi to Lo, Lo to Hi Chops with red theraband + chair behind 2x10   08/26/23 Heel raises on incline x 20 Heel raises on decline x 20 Sit to stand 3 x 5 reps; last 10 reps with left foot advanced 6" step toe taps x 10  "Gold swings" with 2# dowel 2 x 20 reps with CGA for safety Hip vectors 2 x 5 each side  PATIENT EDUCATION: Education details: 08/13/23: Reviewed  goals and course of rehab with patient Person educated: Patient Education method: Explanation Education comprehension: verbalized understanding  HOME EXERCISE PROGRAM: Access Code: [email or web address] URL: [email or web address] Date: 07/21/2023 Prepared by: Becky Sax  Exercises - Supine Bridge  - 2 x daily - 7 x weekly - 1 sets - 10 reps - 5" hold - Sidelying Hip Abduction  - 1 x daily - 7 x weekly - 3 sets - 10 reps - 3" hold - Sit to Stand Without Arm Support  - 3 x daily - 7 x weekly - 1 sets - 10 reps - Seated Toe Raise  - 3 x daily - 7 x weekly - 1 sets - 10 reps - 5" hold      07/27/23:              - Mini Squat with Counter Support  - 2 x daily - 7 x weekly - 1 sets - 10 reps - 5" hold - Hip Extension with Resistance Loop  - 1 x daily - 7 x weekly - 3 sets - 10 reps - Standing Hip Abduction with Resistance at Ankles and Counter Support  - 1 x daily - 7 x weekly - 3 sets - 10 reps - Standing Tandem Balance with Counter Support  - 1 x daily - 7 x weekly - 3 sets - 10 reps  08/26/23 3 way hip (vectors)  GOALS: Goals reviewed with patient? Yes  SHORT TERM GOALS: Target date: 08/13/2023  Pt and caregiver will be independent with HEP in order to demonstrate participation in Physical Therapy POC.  Baseline: Goal status: IN PROGRESS  2.  Pt will report > 25% improvement in subjective safety/balance concerns in order to demonstrate improved self awareness of balance deficits.  Baseline:  Goal status: NOT MET  LONG TERM GOALS: Target date: 10/08/2023  Pt will increase 30 sec STS score by > 4 reps in order to demonstrate improved functional safety and balance skills in ADL/mobility.   Baseline: 10x Goal status: IN PROGRESS  2.  Pt will improve 2 MWT to >300 with LRAD in order to demonstrate improved functional mobility capacity in community setting.  Baseline: 185 ft with RW Goal  status: MET  3.  Pt will improve ABC score by >15%  in order to demonstrate improved  perceived balance with functional goals and outcomes. Baseline: See objective Goal status: IN PROGRESS  4.  Pt will report > 50% improvement in subjective safety/balance concerns in order to demonstrate improved self awareness of balance deficits..  Baseline: See objective Goal status: NOT MET  5. Pt will demonstrate increase in LE strength to 4/5 to facilitate ease and safety in ambulation Baseline:3+/5 Goal status: NEW GOAL   ASSESSMENT:  CLINICAL IMPRESSION: Increased pain in low back to 8/10 on patient arrival today so started with moist heat to decrease pain and increase soft tissue extensibility.  Focused on lower extremity and lumbar mobility.  Patient with noted increased antalgic gait today; shuffling right foot especially; slower gait speed and forward flexed trunk. Worked on hip mobility in supine with noted improvement in left hip mobility after stretching.  Fatigues quickly with right SAQ's.  Noted increased tone right leg from CVA.  Needs cuing for correct performance of pelvic tilt.  Noted decreased antalgic gait at the end of treatment; able to clear right foot during swing phase.  Patient will benefit from continued skilled therapy services  to address deficits and promote return to optimal function.       OBJECTIVE IMPAIRMENTS: Abnormal gait, decreased balance, decreased mobility, decreased ROM, decreased strength, and hypomobility.   ACTIVITY LIMITATIONS: carrying, lifting, bending, sitting, squatting, and locomotion level  PARTICIPATION LIMITATIONS: driving, community activity, and yard work  PERSONAL FACTORS: 08/13/23 Age and 1-2 comorbidities: hx of heart surgery  are also affecting patient's functional outcome.   REHAB POTENTIAL: 08/13/23: Fair    CLINICAL DECISION MAKING: Evolving/moderate complexity  EVALUATION COMPLEXITY: Moderate  PLAN:  PT FREQUENCY: 2x/week  PT DURATION: 8 weeks  PLANNED INTERVENTIONS: Therapeutic exercises, Therapeutic activity,  Neuromuscular re-education, Balance training, Gait training, Patient/Family education, Self Care, Joint mobilization, Stair training, Orthotic/Fit training, DME instructions, Manual therapy, and Re-evaluation  PLAN FOR NEXT SESSION: Continue POC and may progress as tolerated with emphasis on LE functional strengthening.  May add hamstring strengthening next session. Try  squat and reach activity  12:01 PM, 09/21/23 Christon Gallaway Small Earland Reish MPT [facility] [place] [phone]

## 2023-09-21 NOTE — Therapy (Signed)
OUTPATIENT OCCUPATIONAL THERAPY NEURO TREATMENT AND REASSESSMENT/RECERTIFICATION  Patient Name: [name] MRN: [id number] DOB:Dec 05, 1955, 67 y.o., male Today's Date: 09/21/2023   END OF SESSION:  OT End of Session - 09/21/23 1100     Visit Number 13    Number of Visits 16    Date for OT Re-Evaluation 10/16/23    Authorization Type 1) Medicare A & B  2) [employer]    Progress Note Due on Visit 20    OT Start Time 1015    OT Stop Time 1100    OT Time Calculation (min) 45 min    Activity Tolerance Patient tolerated treatment well    Behavior During Therapy WFL for tasks assessed/performed               Past Medical History:  Diagnosis Date   Arthritis    Coronary atherosclerosis of native coronary artery    a. NSTEMI (02/2014):  LHC (02/2014):  Mild disease in LAD and CFX; prox RCA occluded with R-R collats, dist RCA filled by L-R collats, inf HK, EF 55%, LVEDP 15 mmHg.  PCI:  Unsuccessful angioplasty of RCA (late presentation of inf MI) - tx medically.   Essential hypertension    GERD (gastroesophageal reflux disease)    Hyperlipidemia    NSTEMI (non-ST elevated myocardial infarction) (HCC) 03/11/14   Renal cell carcinoma of right kidney (HCC)    Partial nephrectomy in 2013   Past Surgical History:  Procedure Laterality Date   AORTIC VALVE REPLACEMENT N/A 05/18/2023   Procedure: AORTIC VALVE REPLACEMENT (AVR) USING INSPIRIS RESILIA 23 MM AORTIC VALVE;  Surgeon: Alleen Borne, MD;  Location: [facility] OR;  Service: Open Heart Surgery;  Laterality: N/A;   BACK SURGERY     1989   CERVICAL DISC SURGERY  04/29/2021   COLONOSCOPY N/A 12/11/2020   Procedure: COLONOSCOPY;  Surgeon: Franky Macho, MD;  Location: [facility];  Service: Gastroenterology;  Laterality: N/A;   LEFT HEART CATHETERIZATION WITH CORONARY ANGIOGRAM N/A 03/17/2014   Procedure: LEFT HEART CATHETERIZATION WITH CORONARY ANGIOGRAM;  Surgeon: Corky Crafts, MD;  Location: [facility] CATH LAB;  Service:  Cardiovascular;  Laterality: N/A;   POLYPECTOMY  12/11/2020   Procedure: POLYPECTOMY;  Surgeon: Franky Macho, MD;  Location: [facility];  Service: Gastroenterology;;   REPLACEMENT ASCENDING AORTA N/A 05/18/2023   Procedure: REPLACEMENT OF ASCENDING AORTIC ANEURYSM USING A 30 X 10 MM HEMASHIELD PLATINUM GRAFT;  Surgeon: Alleen Borne, MD;  Location: [facility] OR;  Service: Open Heart Surgery;  Laterality: N/A;  CIRC ARREST   RIGHT HEART CATH AND CORONARY ANGIOGRAPHY N/A 04/03/2023   Procedure: RIGHT HEART CATH AND CORONARY ANGIOGRAPHY;  Surgeon: Tonny Bollman, MD;  Location: [facility] INVASIVE CV LAB;  Service: Cardiovascular;  Laterality: N/A;   ROBOT ASSISTED LAPAROSCOPIC NEPHRECTOMY  01/14/2012   Procedure: ROBOTIC ASSISTED LAPAROSCOPIC NEPHRECTOMY;  Surgeon: Milford Cage, MD;  Location: [facility] ORS;  Service: Urology;  Laterality: Right;  Robot Laparoscopic Right Partial Nephrectomy     TEE WITHOUT CARDIOVERSION N/A 05/18/2023   Procedure: TRANSESOPHAGEAL ECHOCARDIOGRAM;  Surgeon: Alleen Borne, MD;  Location: [facility] OR;  Service: Open Heart Surgery;  Laterality: N/A;   TONSILLECTOMY     Patient Active Problem List   Diagnosis Date Noted   Atrial fibrillation (HCC) 06/08/2023   Encounter for therapeutic drug monitoring 06/08/2023   CVA (cerebral vascular accident) (HCC) 05/25/2023   S/P AVR (aortic valve replacement) 05/18/2023   Chronic cough 02/20/2023   Myelopathy concurrent with and due  to spinal stenosis of cervical region [facility]) 02/15/2021   Right leg weakness 02/07/2021   Allodynia 02/07/2021   Gait disturbance 02/07/2021   White matter abnormality on MRI of brain 02/07/2021   Hyperreflexia 02/07/2021   Special screening for malignant neoplasms, colon    Adenomatous polyp of transverse colon    Diverticulosis of large intestine without diverticulitis    PSVT (paroxysmal supraventricular tachycardia) (HCC) 05/29/2014   Precordial pain 04/19/2014   Coronary atherosclerosis of native  coronary artery 04/06/2014   Essential hypertension, benign 04/06/2014   HLD (hyperlipidemia) 04/06/2014   PCP: [name], PA-C REFERRING PROVIDER: Dr. Claudette Laws  ONSET DATE: 05/20/23  REFERRING DIAG: I63.9 (ICD-10-CM) - Cerebrovascular accident (CVA), unspecified mechanism (HCC)   THERAPY DIAG:  Other lack of coordination  Other symptoms and signs involving the nervous system  Rationale for Evaluation and Treatment: Rehabilitation  SUBJECTIVE:   SUBJECTIVE STATEMENT: S: "It's a bad back day"   PERTINENT HISTORY: Pt is a 67 year old male who presented to the [facility] for AVR and ascending aortic aneurysm repair on 05/18/2023. He had evidence of bicuspid aortic valve stenosis with 5.1 cm ascending aortic aneurysm and underwent repair by Dr. Laneta Simmers on 05/18/2023. He was extubated later that day. He had brief episode of hematuria after difficult Foley placement. Remains on Proscar for urinary retention. Intraoperative TEE with EF approximately 50%. Pacer wires and chest tubes removed on 7/03. Chest x-ray was stable and he was transferred out to stepdown unit. Acute blood loss anemia treated with oral iron replacement. Aspirin dosing decreased to 81 mg due to post-operative thrombocytopenia. Later in the evening on 7/03 he was noted to have right hand weakness and rapid response was called. He was noted to have atrial fibrillation with RVR and was hypotensive. He was started on an amiodarone infusion and cardiology consulted. He was transition to amiodarone 200 mg twice daily on 7/04. CT of head without evidence of acute infarct or hemorrhage, however brain MRI showed scattered small acute infarcts in the left cerebellar hemisphere in both cerebral hemispheres likely embolic in etiology.   PRECAUTIONS: Sternal and Fall  WEIGHT BEARING RESTRICTIONS: No  PAIN:  Are you having pain? Yes: NPRS scale: 5/10 Pain location: back Pain description: aching and spasming Aggravating factors:  movement Relieving factors: rest  FALLS: Has patient fallen in last 6 months? No  LIVING ENVIRONMENT: Lives with: lives with their family Lives in: House/apartment Stairs: Yes: External: 2 steps; bilateral but cannot reach both Has following equipment at home: Single point cane and Walker - 2 wheeled  PLOF: Independent  PATIENT GOALS: To get stronger.   OBJECTIVE:   HAND DOMINANCE: Right  ADLs: Overall ADLs: Pt reports he requires increased time for ADLs. Is not driving yet. Pt reports meal preparation is difficult due to being fatigued. Picking up items is difficult, eating with right hand is difficult. Has difficulty with manipulating items.   ACTIVITY TOLERANCE: Activity tolerance: Limited due to recent heart sx  FUNCTIONAL OUTCOME MEASURES: FOTO: 63/100 08/13/23: 62.89/100 09/17/23: 69.43  UPPER EXTREMITY ROM:    BUE A/ROM is [facility]  UPPER EXTREMITY MMT:     MMT Right eval Right 08/13/23 Right 09/17/23  Shoulder flexion 5/5 5/5 5/5  Shoulder abduction 5/5 5/5 5/5  Shoulder internal rotation 5/5 5/5 5/5  Shoulder external rotation 4+/5 4+/5 5/5  Elbow flexion 5/5 5/5 5/5  Elbow extension 5/5 5/5 5/5  Wrist flexion 4/5 4+/5 5/5  Wrist extension 5/5 5/5 5/5  Wrist ulnar  deviation 4/5 5/5 5/5  Wrist radial deviation 4/5 4+/5 5/5  Wrist pronation 5/5 5/5 5/5  Wrist supination 5/5 5/5 5/5  (Blank rows = not tested)  HAND FUNCTION: Grip strength: Right: 30 lbs; Left: 80 lbs, Lateral pinch: Right: 9 lbs, Left: 23 lbs, and 3 point pinch: Right: 3 lbs, Left: 12 lbs 08/13/23: Grip Strength: Right: 46 lbs; Lateral pinch: Right: 12 lbs, 3 Point Pinch: Right: 8lbs 09/17/23: Grip Strength: Right: 51 lbs; Lateral pinch: Right: 13 lbs, 3 Point Pinch: Right: 6lbs  COORDINATION: 9 Hole Peg test: Right: 1' 04 sec; Left: 31.42 sec 08/13/23: 9 Hole Peg test: Right: 51.75 sec 09/17/23: 9 Hole Peg test: Right: 56.28 sec  SENSATION: Tingling in hands/feet at baseline  MUSCLE  TONE: RUE: Mild    TODAY'S TREATMENT:                                                                                                                              DATE:   09/18/23 -Theraputty: red putty, roll into a ball, flatten into a pancake, roll into a log, tripod pinch x15, lateral pinch x15, roll into a ball, full squeeze x10 -Digit Isolation: each digit into extension in isolation, x8 -Digit Opposition x10 each -Digiflex 3lb, full squeeze x15, each digit x8 -Gripper: 37# 10 medium beads, 38# 5 medium beads -Cards: thumb abduction pushing 1 card at a time and flipping it over x15, Shuffling full deck x5  09/17/23 -Measurements for reassessments -Grooved peg board: using fingers only to manipulate the pegs, not using the box. Taking 5 out at a time (max difficulty).   09/14/23 -Strengthening: hammer curl, bicep curl, protraction, flexion, abduction, x12 -Wrist Strengthening: 3lb, flexion, extension, ulnar/radial deviation, supination/pronation, x12 -Nuts and bolts: 12 different bolts of different sizes, picking them out according to size and screwing the matching nuts on -Tiny Peg board: using the tweezers to pick up pegs and place them in the board    PATIENT EDUCATION: Education details: Software engineer Person educated: Patient Education method: Programmer, multimedia, Demonstration, and Handouts Education comprehension: verbalized understanding and returned demonstration  HOME EXERCISE PROGRAM: Eval: finger A/ROM 9/5: Finger Rubber Band Exercises 9/20: shoulder strengthening with dumbbells 9/25: Puzzles, Nuts and bolts 11/4: Card manipulation   GOALS: Goals reviewed with patient? Yes  SHORT TERM GOALS: Target date: 08/15/23  Pt will be provided with and educated on HEP to improve mobility and functional use of right hand during ADLs.   Goal status: IN PROGRESS  2.  Pt will increase right grip strength by 20# and pinch strength by 4# or greater to improve ability to  grasp and hold items for hygiene and meal preparation tasks.   Goal status: IN PROGRESS  3.  Pt will increase right fine motor coordination required for tying shoes or manipulating small objects by completing 9 hole peg test in 45" or less.   Goal status: IN PROGRESS  4.  Pt will be educated on  AE available to improve success and independence in ADL completion.   Goal status: IN PROGRESS    ASSESSMENT:  CLINICAL IMPRESSION: This session pt presented with increased back pain, however it did not affect his ability to complete all seated exercises this session. Session focused on hand strength and manipulation of different objects. He continues to have increased difficulty with finger isolation and requires min hands on assist to fully extend each digit in extension. OT added card manipulation this session, which pt had max difficulty sorting cards with his thumb and using BUE to shuffle cards due to thumb manipulation. Verbal and tactile cuing provided throughout session for positioning and technique.   PERFORMANCE DEFICITS: in functional skills including ADLs, IADLs, coordination, proprioception, sensation, tone, strength, Fine motor control, and UE functional use    PLAN:  OT FREQUENCY: 2x/week  OT DURATION: 4 weeks  PLANNED INTERVENTIONS: therapeutic exercise, therapeutic activity, neuromuscular re-education, splinting, electrical stimulation, ultrasound, patient/family education, and DME and/or AE instructions, self-care  RECOMMENDED OTHER SERVICES: PT  CONSULTED AND AGREED WITH PLAN OF CARE: Patient  PLAN FOR NEXT SESSION: Follow up on HEP, grip and pinch strengthening, coordination tasks   Trish Mage, OTR/L [phone] 09/21/2023, 11:34 AM

## 2023-09-21 NOTE — Patient Instructions (Signed)
Increase warfarin to 1 1/2 tablets daily Recheck in 3 weeks

## 2023-09-24 ENCOUNTER — Ambulatory Visit (HOSPITAL_COMMUNITY): Payer: Medicare Other

## 2023-09-24 DIAGNOSIS — Z7409 Other reduced mobility: Secondary | ICD-10-CM

## 2023-09-24 DIAGNOSIS — M6281 Muscle weakness (generalized): Secondary | ICD-10-CM

## 2023-09-24 DIAGNOSIS — R278 Other lack of coordination: Secondary | ICD-10-CM

## 2023-09-24 DIAGNOSIS — I639 Cerebral infarction, unspecified: Secondary | ICD-10-CM

## 2023-09-24 DIAGNOSIS — R29818 Other symptoms and signs involving the nervous system: Secondary | ICD-10-CM

## 2023-09-24 NOTE — Therapy (Addendum)
OUTPATIENT PHYSICAL THERAPY NEURO TREATMENT  Patient Name: [name] MRN: [id number] DOB:[birth year], 67 y.o., male Today's Date: 09/24/2023  END OF SESSION:   PT End of Session - 09/24/23 1151     Visit Number 15    Number of Visits 16    Date for PT Re-Evaluation 10/08/23    Authorization Type Medicare part A & B    Authorization Time Period no visit limit; no auth    Progress Note Due on Visit 16    PT Start Time 1149    PT Stop Time 1230    PT Time Calculation (min) 41 min    Equipment Utilized During Treatment Gait belt    Activity Tolerance Patient tolerated treatment well    Behavior During Therapy WFL for tasks assessed/performed             Past Medical History:  Diagnosis Date   Arthritis    Coronary atherosclerosis of native coronary artery    a. NSTEMI (02/2014):  LHC (02/2014):  Mild disease in LAD and CFX; prox RCA occluded with R-R collats, dist RCA filled by L-R collats, inf HK, EF 55%, LVEDP 15 mmHg.  PCI:  Unsuccessful angioplasty of RCA (late presentation of inf MI) - tx medically.   Essential hypertension    GERD (gastroesophageal reflux disease)    Hyperlipidemia    NSTEMI (non-ST elevated myocardial infarction) (HCC) 03/11/14   Renal cell carcinoma of right kidney (HCC)    Partial nephrectomy in 2013   Past Surgical History:  Procedure Laterality Date   AORTIC VALVE REPLACEMENT N/A 05/18/2023   Procedure: AORTIC VALVE REPLACEMENT (AVR) USING INSPIRIS RESILIA 23 MM AORTIC VALVE;  Surgeon: Alleen Borne, MD;  Location: [facility] OR;  Service: Open Heart Surgery;  Laterality: N/A;   BACK SURGERY     1989   CERVICAL DISC SURGERY  04/29/2021   COLONOSCOPY N/A 12/11/2020   Procedure: COLONOSCOPY;  Surgeon: Franky Macho, MD;  Location: [facility];  Service: Gastroenterology;  Laterality: N/A;   LEFT HEART CATHETERIZATION WITH CORONARY ANGIOGRAM N/A 03/17/2014   Procedure: LEFT HEART CATHETERIZATION WITH CORONARY ANGIOGRAM;  Surgeon: Corky Crafts,  MD;  Location: [facility] CATH LAB;  Service: Cardiovascular;  Laterality: N/A;   POLYPECTOMY  12/11/2020   Procedure: POLYPECTOMY;  Surgeon: Franky Macho, MD;  Location: [facility];  Service: Gastroenterology;;   REPLACEMENT ASCENDING AORTA N/A 05/18/2023   Procedure: REPLACEMENT OF ASCENDING AORTIC ANEURYSM USING A 30 X 10 MM HEMASHIELD PLATINUM GRAFT;  Surgeon: Alleen Borne, MD;  Location: [facility] OR;  Service: Open Heart Surgery;  Laterality: N/A;  CIRC ARREST   RIGHT HEART CATH AND CORONARY ANGIOGRAPHY N/A 04/03/2023   Procedure: RIGHT HEART CATH AND CORONARY ANGIOGRAPHY;  Surgeon: Tonny Bollman, MD;  Location: [facility] INVASIVE CV LAB;  Service: Cardiovascular;  Laterality: N/A;   ROBOT ASSISTED LAPAROSCOPIC NEPHRECTOMY  01/14/2012   Procedure: ROBOTIC ASSISTED LAPAROSCOPIC NEPHRECTOMY;  Surgeon: Milford Cage, MD;  Location: [facility] ORS;  Service: Urology;  Laterality: Right;  Robot Laparoscopic Right Partial Nephrectomy     TEE WITHOUT CARDIOVERSION N/A 05/18/2023   Procedure: TRANSESOPHAGEAL ECHOCARDIOGRAM;  Surgeon: Alleen Borne, MD;  Location: [facility] OR;  Service: Open Heart Surgery;  Laterality: N/A;   TONSILLECTOMY     Patient Active Problem List   Diagnosis Date Noted   Atrial fibrillation (HCC) 06/08/2023   Encounter for therapeutic drug monitoring 06/08/2023   CVA (cerebral vascular accident) (HCC) 05/25/2023   S/P AVR (aortic valve replacement)  05/18/2023   Chronic cough 02/20/2023   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 [facility]) 02/15/2021   Right leg weakness 02/07/2021   Allodynia 02/07/2021   Gait disturbance 02/07/2021   White matter abnormality on MRI of brain 02/07/2021   Hyperreflexia 02/07/2021   Special screening for malignant neoplasms, colon    Adenomatous polyp of transverse colon    Diverticulosis of large intestine without diverticulitis    PSVT (paroxysmal supraventricular tachycardia) (HCC) 05/29/2014   Precordial pain 04/19/2014    Coronary atherosclerosis of native coronary artery 04/06/2014   Essential hypertension, benign 04/06/2014   HLD (hyperlipidemia) 04/06/2014      PCP: [facility] REFERRING PROVIDER: Erick Colace, MD  ONSET DATE: 05/18/23  REFERRING DIAG: I63.9 (ICD-10-CM) - Cerebrovascular accident (CVA), unspecified mechanism (HCC)   THERAPY DIAG:  Muscle weakness (generalized)  Impaired functional mobility, balance, gait, and endurance  Other lack of coordination  Other symptoms and signs involving the nervous system  Cerebrovascular accident (CVA), unspecified mechanism (HCC)  Rationale for Evaluation and Treatment: Rehabilitation  SUBJECTIVE:                                                                                                                                                                                             SUBJECTIVE STATEMENT: Today his back feels the same as usual. Overall felt pretty good after last session. Was able to do more than usual that afternoon. Reports being busy over the weekend moving furniture. Woke up this morning with right shoulder pain due likely due to sleeping on it strange. I could also be from moving furniture.   Eval:  Pt went into open heart surgery on 7/1 and had small stroke after surgery on primary RUE. Pt has had leg tests over the past few years regarding nerve conduction and circulation issues. Extensive discussion regarding multiple medical issues surrounding BLE weakness hsitory prior to stroke. Was avid golfer. "Along as I got some hope, I with you all the way."  Pt accompanied by: self  PERTINENT HISTORY:  67 year old male who [facility] a ascending aortic aneurysm repair on 05/18/2023.  Patient with blood loss given treatment with iron replacement.  Later on 7 of 3/24 patient noted with right hand weakness.  Rapid response was called.  Patient was noted to be in atrial fibrillation with RVR and  was hypotensive.  MRI showed scattered small acute infarction left cerebral hemisphere in both cerebral hemispheres.  C3 spinal surgery Open heart surgery on 05/18/23.  LBP and BLE weakness Dec'22 to current.   PAIN:  Are  you having pain?  Pt reports chronic pain but nothing new.   PRECAUTIONS: No more sternal precautions since end of September 2024       RED FLAGS: None   WEIGHT BEARING RESTRICTIONS: No  FALLS: Has patient fallen in last 6 months? No  LIVING ENVIRONMENT: Lives with: lives with their family Lives in: House/apartment Stairs:  2 steps in/out, no concerns.  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 Pt had Dme avilable prior to stroke but was not using those  dependently, only when back pain was bad.   PATIENT GOALS: "golf might be a lofty goal"  OBJECTIVE: (all objective findings are from initial evaluation 07/16/23 unless otherwise dated)  DIAGNOSTIC FINDINGS:   IMPRESSION: Scattered small acute infarcts in the left cerebellar hemisphere and both cerebral hemispheres, likely embolic in etiology.  COGNITION: Overall cognitive status: Within functional limits for tasks assessed   SENSATION: WFL  COORDINATION: WFL  POSTURE: No Significant postural limitations  LOWER EXTREMITY ROM:     Active  Right Eval Left Eval  Hip flexion    Hip extension    Hip abduction    Hip adduction    Hip internal rotation    Hip external rotation    Knee flexion    Knee extension    Ankle dorsiflexion    Ankle plantarflexion    Ankle inversion    Ankle eversion     (Blank rows = not tested)  LOWER EXTREMITY MMT:    MMT Right Eval Left Eval Right 07/21/23 Left  07/21/23 Right 08/13/23 Left 08/13/23  Hip flexion   4/5 5/5 4+ 5  Hip extension   3+ Sidelying for sternal precaution 4-/5    Hip abduction   3+  4/5 4 4   Hip adduction        Hip internal rotation        Hip external rotation        Knee flexion     4 4  Knee extension 3+ 3+   4 4  Ankle dorsiflexion 2+    3+ 4  Ankle plantarflexion     4- 4  Ankle inversion        Ankle eversion        (Blank rows = not tested)     GAIT: 08/13/23 Gait pattern:  decrease R knee flexion on swing phase, decreased step length- Right, decreased ankle dorsiflexion- Right, and Right foot flat Distance walked: 305 feet Assistive device utilized: Single point cane Level of assistance: SBA Comments: Patient ambulating with steady gait pattern without cueing, ambulating with right flatfoot and sliding through RLE swing.  FUNCTIONAL TESTS:  30 seconds chair stand test:08/13/23 12x from 10x 2 minute walk test: 08/13/23: 305 ft from 162ft  PATIENT SURVEYS:  ABC scale 08/13/23: 48% from 45% impairment.   TODAY'S TREATMENT:  DATE: 09/24/23 Supine: Moist heat low back to decrease pain and increase soft tissue extensibility in low back in supine  Hip PROM in all directions to help decrease muscle tone and improve motion  Hip and knee full flexion to extension with manual resistance x10 each side Diaphragmatic breathing with LTR x10 each side Hook lying clamshells GTB x15 Hook lying ball squeeze x15 Sit to stand with GTB x15 Side stepping with GTB at knees maintaining mini squat 2x10 each direction    09/21/23 Seated Moist heat low back x 5 to decrease pain and increase soft tissue extensibility in low back Supine  Single knee to chest AAROM x 8 Hip internal external rotation stretch and contract relax bilaterally x 10 each Right knee SAQ's x 10 Pelvic tilt x 8 Bridge x 8  09/17/2023  -Unilateral dumbbell carry 57ft x 1 for 8lb in RUE; Unilateral dumbbell carry 68ft x 1 with 10lb dumbbell in RUE- CGA for balance.  -RUE 4in  stepovers with 3/4lb ankle weight hanging on R foot with single UE support @ // bars -Squat and hold with cone stacking with RUE with lateral reaching and anterior reaching 2 x 8 CGA for balace.  -4in stepups with 10lb unilateral carry in RUE. CGA for balance   09/14/23 Standing in // bars  Hip abduction, flexion with 2# 2x10  S2S from chair 2x10 Standing   Hi to Lo, Lo to Hi Chops with red theraband + chair behind 2x10   08/26/23 Heel raises on incline x 20 Heel raises on decline x 20 Sit to stand 3 x 5 reps; last 10 reps with left foot advanced 6" step toe taps x 10  "Gold swings" with 2# dowel 2 x 20 reps with CGA for safety Hip vectors 2 x 5 each side  PATIENT EDUCATION: Education details: 08/13/23: Reviewed goals and course of rehab with patient Person educated: Patient Education method: Explanation Education comprehension: verbalized understanding  HOME EXERCISE PROGRAM: Access Code: [email or web address] URL: [email or web address] Date: 07/21/2023 Prepared by: Becky Sax  Exercises - Supine Bridge  - 2 x daily - 7 x weekly - 1 sets - 10 reps - 5" hold - Sidelying Hip Abduction  - 1 x daily - 7 x weekly - 3 sets - 10 reps - 3" hold - Sit to Stand Without Arm Support  - 3 x daily - 7 x weekly - 1 sets - 10 reps - Seated Toe Raise  - 3 x daily - 7 x weekly - 1 sets - 10 reps - 5" hold      07/27/23:              - Mini Squat with Counter Support  - 2 x daily - 7 x weekly - 1 sets - 10 reps - 5" hold - Hip Extension with Resistance Loop  - 1 x daily - 7 x weekly - 3 sets - 10 reps - Standing Hip Abduction with Resistance at Ankles and Counter Support  - 1 x daily - 7 x weekly - 3 sets - 10 reps - Standing Tandem Balance with Counter Support  - 1 x daily - 7 x weekly - 3 sets - 10 reps  08/26/23 3 way hip (vectors)  GOALS: Goals reviewed with patient? Yes  SHORT TERM GOALS: Target date: 08/13/2023  Pt and caregiver will be independent with HEP in order to  demonstrate participation in Physical Therapy POC.  Baseline: Goal status: IN PROGRESS  2.  Pt will report >  25% improvement in subjective safety/balance concerns in order to demonstrate improved self awareness of balance deficits.  Baseline:  Goal status: NOT MET  LONG TERM GOALS: Target date: 10/08/2023  Pt will increase 30 sec STS score by > 4 reps in order to demonstrate improved functional safety and balance skills in ADL/mobility.   Baseline: 10x Goal status: IN PROGRESS  2.  Pt will improve 2 MWT to >300 with LRAD in order to demonstrate improved functional mobility capacity in community setting.  Baseline: 185 ft with RW Goal status: MET  3.  Pt will improve ABC score by >15%  in order to demonstrate improved perceived balance with functional goals and outcomes. Baseline: See objective Goal status: IN PROGRESS  4.  Pt will report > 50% improvement in subjective safety/balance concerns in order to demonstrate improved self awareness of balance deficits..  Baseline: See objective Goal status: NOT MET  5. Pt will demonstrate increase in LE strength to 4/5 to facilitate ease and safety in ambulation Baseline:3+/5 Goal status: NEW GOAL   ASSESSMENT:  CLINICAL IMPRESSION: Today's session started with moist heat to increase soft tissue extensibility and relax low back muscle function. Focused on lower extremity and lumbar mobility.  After working on hip mobility in supine followed by hip activation exercises, patient noted feeling less tense and had more volitional control. Needs cuing for maintaining mini squat during side steps but was able to keep knee slightly flexed with cuing. Noted decrease in antalgic gait at the end of treatment; able to clear right foot during swing phase. Still demonstrates right sided extension synergy during gait. Slowing gait allowed for improved heel strike and toe off. Patient will benefit from continued skilled therapy services  to address deficits  and promote return to optimal function.       OBJECTIVE IMPAIRMENTS: Abnormal gait, decreased balance, decreased mobility, decreased ROM, decreased strength, and hypomobility.   ACTIVITY LIMITATIONS: carrying, lifting, bending, sitting, squatting, and locomotion level  PARTICIPATION LIMITATIONS: driving, community activity, and yard work  PERSONAL FACTORS: 08/13/23 Age and 1-2 comorbidities: hx of heart surgery  are also affecting patient's functional outcome.   REHAB POTENTIAL: 08/13/23: Fair    CLINICAL DECISION MAKING: Evolving/moderate complexity  EVALUATION COMPLEXITY: Moderate  PLAN:  PT FREQUENCY: 2x/week  PT DURATION: 8 weeks  PLANNED INTERVENTIONS: Therapeutic exercises, Therapeutic activity, Neuromuscular re-education, Balance training, Gait training, Patient/Family education, Self Care, Joint mobilization, Stair training, Orthotic/Fit training, DME instructions, Manual therapy, and Re-evaluation  PLAN FOR NEXT SESSION: continue to start with manual therapy to help decrease muscle tension; manual PROM stretching; introduce variations of stepping interventions to focus on improving ambulation with a flexed knee.    12:52 PM, 09/24/23 Karna Christmas, SPT  " I agree with the following treatment note after reviewing documentation. This session was performed under the supervision of a licensed clinician."    1:48 PM, 09/25/23 Amy Small Lynch MPT [facility] [place] [phone]

## 2023-09-29 ENCOUNTER — Ambulatory Visit (HOSPITAL_COMMUNITY): Payer: Medicare Other

## 2023-09-29 DIAGNOSIS — R278 Other lack of coordination: Secondary | ICD-10-CM | POA: Diagnosis not present

## 2023-09-29 DIAGNOSIS — M6281 Muscle weakness (generalized): Secondary | ICD-10-CM

## 2023-09-29 DIAGNOSIS — R29818 Other symptoms and signs involving the nervous system: Secondary | ICD-10-CM

## 2023-09-29 DIAGNOSIS — Z7409 Other reduced mobility: Secondary | ICD-10-CM

## 2023-09-29 DIAGNOSIS — I639 Cerebral infarction, unspecified: Secondary | ICD-10-CM

## 2023-09-29 NOTE — Therapy (Signed)
OUTPATIENT PHYSICAL THERAPY NEURO   PHYSICAL THERAPY DISCHARGE SUMMARY  Visits from Start of Care: 16  Current functional level related to goals / functional outcomes: See below    Remaining deficits: See below    Education / Equipment: See below   Patient agrees to discharge. Patient goals were  5/7 rehab goals met . Patient is being discharged due to maximized rehab potential.      Patient Name: [name] MRN: [id number] DOB:[birth year], 66 y.o., male Today's Date: 09/29/2023  END OF SESSION:   PT End of Session - 09/29/23 1103     Visit Number 16    Number of Visits 16    Date for PT Re-Evaluation 10/08/23    Authorization Type Medicare part A & B    Authorization Time Period no visit limit; no auth    Progress Note Due on Visit 16    PT Start Time 1100    PT Stop Time 1140    PT Time Calculation (min) 40 min    Equipment Utilized During Treatment Gait belt    Activity Tolerance Patient tolerated treatment well    Behavior During Therapy WFL for tasks assessed/performed             Past Medical History:  Diagnosis Date   Arthritis    Coronary atherosclerosis of native coronary artery    a. NSTEMI (02/2014):  LHC (02/2014):  Mild disease in LAD and CFX; prox RCA occluded with R-R collats, dist RCA filled by L-R collats, inf HK, EF 55%, LVEDP 15 mmHg.  PCI:  Unsuccessful angioplasty of RCA (late presentation of inf MI) - tx medically.   Essential hypertension    GERD (gastroesophageal reflux disease)    Hyperlipidemia    NSTEMI (non-ST elevated myocardial infarction) (HCC) 03/11/14   Renal cell carcinoma of right kidney (HCC)    Partial nephrectomy in 2013   Past Surgical History:  Procedure Laterality Date   AORTIC VALVE REPLACEMENT N/A 05/18/2023   Procedure: AORTIC VALVE REPLACEMENT (AVR) USING INSPIRIS RESILIA 23 MM AORTIC VALVE;  Surgeon: Alleen Borne, MD;  Location: [facility] OR;  Service: Open Heart Surgery;  Laterality: N/A;   BACK SURGERY     1989    CERVICAL DISC SURGERY  04/29/2021   COLONOSCOPY N/A 12/11/2020   Procedure: COLONOSCOPY;  Surgeon: Franky Macho, MD;  Location: [facility];  Service: Gastroenterology;  Laterality: N/A;   LEFT HEART CATHETERIZATION WITH CORONARY ANGIOGRAM N/A 03/17/2014   Procedure: LEFT HEART CATHETERIZATION WITH CORONARY ANGIOGRAM;  Surgeon: Corky Crafts, MD;  Location: [facility] CATH LAB;  Service: Cardiovascular;  Laterality: N/A;   POLYPECTOMY  12/11/2020   Procedure: POLYPECTOMY;  Surgeon: Franky Macho, MD;  Location: [facility];  Service: Gastroenterology;;   REPLACEMENT ASCENDING AORTA N/A 05/18/2023   Procedure: REPLACEMENT OF ASCENDING AORTIC ANEURYSM USING A 30 X 10 MM HEMASHIELD PLATINUM GRAFT;  Surgeon: Alleen Borne, MD;  Location: [facility] OR;  Service: Open Heart Surgery;  Laterality: N/A;  CIRC ARREST   RIGHT HEART CATH AND CORONARY ANGIOGRAPHY N/A 04/03/2023   Procedure: RIGHT HEART CATH AND CORONARY ANGIOGRAPHY;  Surgeon: Tonny Bollman, MD;  Location: [facility] INVASIVE CV LAB;  Service: Cardiovascular;  Laterality: N/A;   ROBOT ASSISTED LAPAROSCOPIC NEPHRECTOMY  01/14/2012   Procedure: ROBOTIC ASSISTED LAPAROSCOPIC NEPHRECTOMY;  Surgeon: Milford Cage, MD;  Location: [facility] ORS;  Service: Urology;  Laterality: Right;  Robot Laparoscopic Right Partial Nephrectomy     TEE WITHOUT CARDIOVERSION N/A 05/18/2023  Procedure: TRANSESOPHAGEAL ECHOCARDIOGRAM;  Surgeon: Alleen Borne, MD;  Location: [facility] OR;  Service: Open Heart Surgery;  Laterality: N/A;   TONSILLECTOMY     Patient Active Problem List   Diagnosis Date Noted   Atrial fibrillation (HCC) 06/08/2023   Encounter for therapeutic drug monitoring 06/08/2023   CVA (cerebral vascular accident) (HCC) 05/25/2023   S/P AVR (aortic valve replacement) 05/18/2023   Chronic cough 02/20/2023   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 [facility]) 02/15/2021   Right leg weakness 02/07/2021   Allodynia 02/07/2021   Gait  disturbance 02/07/2021   White matter abnormality on MRI of brain 02/07/2021   Hyperreflexia 02/07/2021   Special screening for malignant neoplasms, colon    Adenomatous polyp of transverse colon    Diverticulosis of large intestine without diverticulitis    PSVT (paroxysmal supraventricular tachycardia) (HCC) 05/29/2014   Precordial pain 04/19/2014   Coronary atherosclerosis of native coronary artery 04/06/2014   Essential hypertension, benign 04/06/2014   HLD (hyperlipidemia) 04/06/2014      PCP: [facility] REFERRING PROVIDER: Erick Colace, MD  ONSET DATE: 05/18/23  REFERRING DIAG: I63.9 (ICD-10-CM) - Cerebrovascular accident (CVA), unspecified mechanism (HCC)   THERAPY DIAG:  Muscle weakness (generalized)  Impaired functional mobility, balance, gait, and endurance  Other symptoms and signs involving the nervous system  Cerebrovascular accident (CVA), unspecified mechanism (HCC)  Rationale for Evaluation and Treatment: Rehabilitation  SUBJECTIVE:                                                                                                                                                                                             SUBJECTIVE STATEMENT:   09/29/23 Progress Note: Patient states feeling 80% better ever since July 2024. Patient states his overall gait improved; patient only uses his single point cane on " bad back days." Patient reports some soreness on days of heavier housework days. Sometimes uses rolling walker at night outside. Patient is independent in homemaking, ADL completion   Eval:  Pt went into open heart surgery on 7/1 and had small stroke after surgery on primary RUE. Pt has had leg tests over the past few years regarding nerve conduction and circulation issues. Extensive discussion regarding multiple medical issues surrounding BLE weakness hsitory prior to stroke. Was avid golfer. "Along as I got some hope, I with you all the  way."  Pt accompanied by: self  PERTINENT HISTORY:  67 year old male who [facility] a ascending aortic aneurysm repair on 05/18/2023.  Patient with blood loss given treatment with iron replacement.  Later on 7 of  3/24 patient noted with right hand weakness.  Rapid response was called.  Patient was noted to be in atrial fibrillation with RVR and was hypotensive.  MRI showed scattered small acute infarction left cerebral hemisphere in both cerebral hemispheres.  C3 spinal surgery Open heart surgery on 05/18/23.  LBP and BLE weakness Dec'22 to current.   PAIN:  Are you having pain?  Pt reports chronic pain but nothing new.   PRECAUTIONS: No more sternal precautions since end of September 2024       RED FLAGS: None   WEIGHT BEARING RESTRICTIONS: No  FALLS: Has patient fallen in last 6 months? No  LIVING ENVIRONMENT: Lives with: lives with their family Lives in: House/apartment Stairs:  2 steps in/out, no concerns.  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 Pt had Dme avilable prior to stroke but was not using those  dependently, only when back pain was bad.   PATIENT GOALS: "golf might be a lofty goal"  OBJECTIVE: (all objective findings are from initial evaluation 07/16/23 unless otherwise dated)  DIAGNOSTIC FINDINGS:   IMPRESSION: Scattered small acute infarcts in the left cerebellar hemisphere and both cerebral hemispheres, likely embolic in etiology.  COGNITION: Overall cognitive status: Within functional limits for tasks assessed   SENSATION: WFL  COORDINATION: WFL  POSTURE: No Significant postural limitations  LOWER EXTREMITY ROM:     11/12 Bilateral are [facility]   LOWER EXTREMITY MMT:    MMT Right Eval Left Eval Right 07/21/23 Left  07/21/23 Right 08/13/23 Left 08/13/23  Hip flexion   4/5  5/5 4+ 5  Hip extension   3+ Sidelying for sternal precaution 4-/5    Hip abduction   3+ 4/5 4 4   Hip adduction        Hip internal rotation        Hip external rotation        Knee flexion     4 4  Knee extension 3+ 3+   4 4  Ankle dorsiflexion 2+    3+ 4  Ankle plantarflexion     4- 4  Ankle inversion        Ankle eversion        (Blank rows = not tested)     GAIT: 08/13/23 Gait pattern:  decrease R knee flexion on swing phase, decreased step length- Right, decreased ankle dorsiflexion- Right, and Right foot flat Distance walked: 305 feet Assistive device utilized: Single point cane Level of assistance: SBA Comments: Patient ambulating with steady gait pattern without cueing, ambulating with right flatfoot and sliding through RLE swing.  FUNCTIONAL TESTS:  30 seconds chair stand test:08/13/23 12x from 10x 2 minute walk test: 08/13/23: 305 ft from 187ft  09/29/23 Progress Note  30 seconds chair stand test: 14 reps with no UE assistance  2 minute walk test: 227 feet with no assistive device, stand-by assist   PATIENT SURVEYS:  ABC scale 08/13/23: 48% from 45% impairment.  ABC scale 09/29/23: 40% from 45% impairment.   TODAY'S TREATMENT:  DATE:  09/29/23 PT Discharge  PT objective tests & measures   09/24/23 Supine: Moist heat low back to decrease pain and increase soft tissue extensibility in low back in supine  Hip PROM in all directions to help decrease muscle tone and improve motion  Hip and knee full flexion to extension with manual resistance x10 each side Diaphragmatic breathing with LTR x10 each side Hook lying clamshells GTB x15 Hook lying ball squeeze x15 Sit to stand with GTB x15 Side stepping with GTB at knees maintaining mini squat 2x10 each direction    09/21/23 Seated Moist heat low back x 5 to decrease pain and increase soft  tissue extensibility in low back Supine  Single knee to chest AAROM x 8 Hip internal external rotation stretch and contract relax bilaterally x 10 each Right knee SAQ's x 10 Pelvic tilt x 8 Bridge x 8  09/17/2023  -Unilateral dumbbell carry 62ft x 1 for 8lb in RUE; Unilateral dumbbell carry 50ft x 1 with 10lb dumbbell in RUE- CGA for balance.  -RUE 4in stepovers with 3/4lb ankle weight hanging on R foot with single UE support @ // bars -Squat and hold with cone stacking with RUE with lateral reaching and anterior reaching 2 x 8 CGA for balace.  -4in stepups with 10lb unilateral carry in RUE. CGA for balance   09/14/23 Standing in // bars  Hip abduction, flexion with 2# 2x10  S2S from chair 2x10 Standing   Hi to Lo, Lo to [place] with red theraband + chair behind 2x10   PATIENT EDUCATION: Education details: 08/13/23: Reviewed goals and course of rehab with patient Person educated: Patient Education method: Explanation Education comprehension: verbalized understanding  HOME EXERCISE PROGRAM: Access Code: [email or web address] URL: [email or web address] Date: 09/29/2023 Prepared by: Seymour Bars  Exercises - Supine Bridge  - 2 x daily - 7 x weekly - 1 sets - 10 reps - 5" hold - Sidelying Hip Abduction  - 1 x daily - 7 x weekly - 3 sets - 10 reps - 3" hold - Sit to Stand Without Arm Support  - 3 x daily - 7 x weekly - 1 sets - 10 reps - Seated Toe Raise  - 3 x daily - 7 x weekly - 1 sets - 10 reps - 5" hold - Mini Squat with Counter Support  - 2 x daily - 7 x weekly - 1 sets - 10 reps - 5" hold - Hip Extension with Resistance Loop  - 1 x daily - 7 x weekly - 3 sets - 10 reps - Standing Hip Abduction with Resistance at Ankles and Counter Support  - 1 x daily - 7 x weekly - 3 sets - 10 reps - Standing Tandem Balance with Counter Support  - 1 x daily - 7 x weekly - 3 sets - 10 reps - 3 way kick holding to counter for support  - 1 x daily - 7 x weekly - 2 sets - 5  reps  GOALS: Goals reviewed with patient? Yes  SHORT TERM GOALS: Target date: 08/13/2023  Pt and caregiver will be independent with HEP in order to demonstrate participation in Physical Therapy POC.  Baseline: Goal status: MET  2.  Pt will report > 25% improvement in subjective safety/balance concerns in order to demonstrate improved self awareness of balance deficits.  Baseline:  Goal status: MET  LONG TERM GOALS: Target date: 10/08/2023  Pt will increase 30 sec STS score by > 4 reps in order to  demonstrate improved functional safety and balance skills in ADL/mobility.   Baseline: 10x Goal status: MET  2.  Pt will improve 2 MWT to >300 with LRAD in order to demonstrate improved functional mobility capacity in community setting.  Baseline: 185 ft with RW Goal status: MET  3.  Pt will improve ABC score by >15%  in order to demonstrate improved perceived balance with functional goals and outcomes. Baseline: See objective Goal status: IN PROGRESS  4.  Pt will report > 50% improvement in subjective safety/balance concerns in order to demonstrate improved self awareness of balance deficits..  Baseline: See objective Goal status: MET  5. Pt will demonstrate increase in LE strength to 4/5 to facilitate ease and safety in ambulation Baseline:3+/5 Goal status: IN PROGRESS  5/7 Goals met   ASSESSMENT:  CLINICAL IMPRESSION: Discharge summary: As per subjective, patient feels "80% better" since his incident. Patient has show improvements in overall lower extremity strength, balance, gait speed. Patient met 5/7 stated rehab goal; maximized rehab potential. Patient discharged to HEP; able to show GOOD HEP adherence/understanding     OBJECTIVE IMPAIRMENTS: Abnormal gait, decreased balance, decreased mobility, decreased ROM, decreased strength, and hypomobility.   ACTIVITY LIMITATIONS: carrying, lifting, bending, sitting, squatting, and locomotion level  PARTICIPATION LIMITATIONS:  driving, community activity, and yard work  PERSONAL FACTORS: 08/13/23 Age and 1-2 comorbidities: hx of heart surgery  are also affecting patient's functional outcome.   REHAB POTENTIAL: 08/13/23: Fair    CLINICAL DECISION MAKING: Evolving/moderate complexity  EVALUATION COMPLEXITY: Moderate  PLAN:  PT FREQUENCY: 2x/week  PT DURATION: 8 weeks  PLANNED INTERVENTIONS: Therapeutic exercises, Therapeutic activity, Neuromuscular re-education, Balance training, Gait training, Patient/Family education, Self Care, Joint mobilization, Stair training, Orthotic/Fit training, DME instructions, Manual therapy, and Re-evaluation  PLAN FOR NEXT SESSION: D/C    11:30 AM, 09/29/23 Seymour Bars PT, DPT  [facility] [place] [phone]

## 2023-10-01 ENCOUNTER — Ambulatory Visit (HOSPITAL_COMMUNITY): Payer: Medicare Other | Admitting: Occupational Therapy

## 2023-10-01 ENCOUNTER — Ambulatory Visit (HOSPITAL_COMMUNITY): Payer: Medicare Other

## 2023-10-01 ENCOUNTER — Encounter (HOSPITAL_COMMUNITY): Payer: Self-pay | Admitting: Occupational Therapy

## 2023-10-01 DIAGNOSIS — R29818 Other symptoms and signs involving the nervous system: Secondary | ICD-10-CM

## 2023-10-01 DIAGNOSIS — R278 Other lack of coordination: Secondary | ICD-10-CM | POA: Diagnosis not present

## 2023-10-01 NOTE — Therapy (Signed)
OUTPATIENT OCCUPATIONAL THERAPY NEURO TREATMENT   Patient Name: [name] MRN: [id number] DOB:08-Oct-1956, 67 y.o., male Today's Date: 10/02/2023   END OF SESSION:  OT End of Session - 10/01/23 1146     Visit Number 14    Number of Visits 16    Date for OT Re-Evaluation 10/16/23    Authorization Type 1) Medicare A & B  2) [employer]    Progress Note Due on Visit 20    OT Start Time 1106    OT Stop Time 1146    OT Time Calculation (min) 40 min    Activity Tolerance Patient tolerated treatment well    Behavior During Therapy WFL for tasks assessed/performed             Past Medical History:  Diagnosis Date   Arthritis    Coronary atherosclerosis of native coronary artery    a. NSTEMI (02/2014):  LHC (02/2014):  Mild disease in LAD and CFX; prox RCA occluded with R-R collats, dist RCA filled by L-R collats, inf HK, EF 55%, LVEDP 15 mmHg.  PCI:  Unsuccessful angioplasty of RCA (late presentation of inf MI) - tx medically.   Essential hypertension    GERD (gastroesophageal reflux disease)    Hyperlipidemia    NSTEMI (non-ST elevated myocardial infarction) (HCC) 03/11/14   Renal cell carcinoma of right kidney (HCC)    Partial nephrectomy in 2013   Past Surgical History:  Procedure Laterality Date   AORTIC VALVE REPLACEMENT N/A 05/18/2023   Procedure: AORTIC VALVE REPLACEMENT (AVR) USING INSPIRIS RESILIA 23 MM AORTIC VALVE;  Surgeon: Alleen Borne, MD;  Location: [facility] OR;  Service: Open Heart Surgery;  Laterality: N/A;   BACK SURGERY     1989   CERVICAL DISC SURGERY  04/29/2021   COLONOSCOPY N/A 12/11/2020   Procedure: COLONOSCOPY;  Surgeon: Franky Macho, MD;  Location: [facility];  Service: Gastroenterology;  Laterality: N/A;   LEFT HEART CATHETERIZATION WITH CORONARY ANGIOGRAM N/A 03/17/2014   Procedure: LEFT HEART CATHETERIZATION WITH CORONARY ANGIOGRAM;  Surgeon: Corky Crafts, MD;  Location: [facility] CATH LAB;  Service: Cardiovascular;  Laterality: N/A;   POLYPECTOMY   12/11/2020   Procedure: POLYPECTOMY;  Surgeon: Franky Macho, MD;  Location: [facility];  Service: Gastroenterology;;   REPLACEMENT ASCENDING AORTA N/A 05/18/2023   Procedure: REPLACEMENT OF ASCENDING AORTIC ANEURYSM USING A 30 X 10 MM HEMASHIELD PLATINUM GRAFT;  Surgeon: Alleen Borne, MD;  Location: [facility] OR;  Service: Open Heart Surgery;  Laterality: N/A;  CIRC ARREST   RIGHT HEART CATH AND CORONARY ANGIOGRAPHY N/A 04/03/2023   Procedure: RIGHT HEART CATH AND CORONARY ANGIOGRAPHY;  Surgeon: Tonny Bollman, MD;  Location: [facility] INVASIVE CV LAB;  Service: Cardiovascular;  Laterality: N/A;   ROBOT ASSISTED LAPAROSCOPIC NEPHRECTOMY  01/14/2012   Procedure: ROBOTIC ASSISTED LAPAROSCOPIC NEPHRECTOMY;  Surgeon: Milford Cage, MD;  Location: [facility] ORS;  Service: Urology;  Laterality: Right;  Robot Laparoscopic Right Partial Nephrectomy     TEE WITHOUT CARDIOVERSION N/A 05/18/2023   Procedure: TRANSESOPHAGEAL ECHOCARDIOGRAM;  Surgeon: Alleen Borne, MD;  Location: [facility] OR;  Service: Open Heart Surgery;  Laterality: N/A;   TONSILLECTOMY     Patient Active Problem List   Diagnosis Date Noted   Atrial fibrillation (HCC) 06/08/2023   Encounter for therapeutic drug monitoring 06/08/2023   CVA (cerebral vascular accident) (HCC) 05/25/2023   S/P AVR (aortic valve replacement) 05/18/2023   Chronic cough 02/20/2023   Myelopathy concurrent with and due to spinal stenosis  of cervical region [facility]) 02/15/2021   Right leg weakness 02/07/2021   Allodynia 02/07/2021   Gait disturbance 02/07/2021   White matter abnormality on MRI of brain 02/07/2021   Hyperreflexia 02/07/2021   Special screening for malignant neoplasms, colon    Adenomatous polyp of transverse colon    Diverticulosis of large intestine without diverticulitis    PSVT (paroxysmal supraventricular tachycardia) (HCC) 05/29/2014   Precordial pain 04/19/2014   Coronary atherosclerosis of native coronary artery 04/06/2014   Essential  hypertension, benign 04/06/2014   HLD (hyperlipidemia) 04/06/2014   PCP: [name], PA-C REFERRING PROVIDER: Dr. Claudette Laws  ONSET DATE: 05/20/23  REFERRING DIAG: I63.9 (ICD-10-CM) - Cerebrovascular accident (CVA), unspecified mechanism (HCC)   THERAPY DIAG:  Other lack of coordination  Other symptoms and signs involving the nervous system  Rationale for Evaluation and Treatment: Rehabilitation  SUBJECTIVE:   SUBJECTIVE STATEMENT: S: "I think the weather has me stiff"   PERTINENT HISTORY: Pt is a 67 year old male who presented to the [facility] for AVR and ascending aortic aneurysm repair on 05/18/2023. He had evidence of bicuspid aortic valve stenosis with 5.1 cm ascending aortic aneurysm and underwent repair by Dr. Laneta Simmers on 05/18/2023. He was extubated later that day. He had brief episode of hematuria after difficult Foley placement. Remains on Proscar for urinary retention. Intraoperative TEE with EF approximately 50%. Pacer wires and chest tubes removed on 7/03. Chest x-ray was stable and he was transferred out to stepdown unit. Acute blood loss anemia treated with oral iron replacement. Aspirin dosing decreased to 81 mg due to post-operative thrombocytopenia. Later in the evening on 7/03 he was noted to have right hand weakness and rapid response was called. He was noted to have atrial fibrillation with RVR and was hypotensive. He was started on an amiodarone infusion and cardiology consulted. He was transition to amiodarone 200 mg twice daily on 7/04. CT of head without evidence of acute infarct or hemorrhage, however brain MRI showed scattered small acute infarcts in the left cerebellar hemisphere in both cerebral hemispheres likely embolic in etiology.   PRECAUTIONS: Sternal and Fall  WEIGHT BEARING RESTRICTIONS: No  PAIN:  Are you having pain? Yes: NPRS scale: 3/10 Pain location: back Pain description: aching and spasming Aggravating factors: movement Relieving factors:  rest  FALLS: Has patient fallen in last 6 months? No  LIVING ENVIRONMENT: Lives with: lives with their family Lives in: House/apartment Stairs: Yes: External: 2 steps; bilateral but cannot reach both Has following equipment at home: Single point cane and Walker - 2 wheeled  PLOF: Independent  PATIENT GOALS: To get stronger.   OBJECTIVE:   HAND DOMINANCE: Right  ADLs: Overall ADLs: Pt reports he requires increased time for ADLs. Is not driving yet. Pt reports meal preparation is difficult due to being fatigued. Picking up items is difficult, eating with right hand is difficult. Has difficulty with manipulating items.   ACTIVITY TOLERANCE: Activity tolerance: Limited due to recent heart sx  FUNCTIONAL OUTCOME MEASURES: FOTO: 63/100 08/13/23: 62.89/100 09/17/23: 69.43  UPPER EXTREMITY ROM:    BUE A/ROM is [facility]  UPPER EXTREMITY MMT:     MMT Right eval Right 08/13/23 Right 09/17/23  Shoulder flexion 5/5 5/5 5/5  Shoulder abduction 5/5 5/5 5/5  Shoulder internal rotation 5/5 5/5 5/5  Shoulder external rotation 4+/5 4+/5 5/5  Elbow flexion 5/5 5/5 5/5  Elbow extension 5/5 5/5 5/5  Wrist flexion 4/5 4+/5 5/5  Wrist extension 5/5 5/5 5/5  Wrist ulnar deviation  4/5 5/5 5/5  Wrist radial deviation 4/5 4+/5 5/5  Wrist pronation 5/5 5/5 5/5  Wrist supination 5/5 5/5 5/5  (Blank rows = not tested)  HAND FUNCTION: Grip strength: Right: 30 lbs; Left: 80 lbs, Lateral pinch: Right: 9 lbs, Left: 23 lbs, and 3 point pinch: Right: 3 lbs, Left: 12 lbs 08/13/23: Grip Strength: Right: 46 lbs; Lateral pinch: Right: 12 lbs, 3 Point Pinch: Right: 8lbs 09/17/23: Grip Strength: Right: 51 lbs; Lateral pinch: Right: 13 lbs, 3 Point Pinch: Right: 6lbs  COORDINATION: 9 Hole Peg test: Right: 1' 04 sec; Left: 31.42 sec 08/13/23: 9 Hole Peg test: Right: 51.75 sec 09/17/23: 9 Hole Peg test: Right: 56.28 sec  SENSATION: Tingling in hands/feet at baseline  MUSCLE TONE: RUE: Mild    TODAY'S  TREATMENT:                                                                                                                              DATE:   10/01/23 -Pinch Strengthening: green, blue, and black resistance clips, x7 each, attempting tip to tip pinch, however continued to "slide" into lateral pinch, placing all clips on to vertical pole, then back down to horizontal pole -Coins: holding 10 coins and placing them in piggy bank, picking up all 10 then placing them  -Rebounder: red and yellow weighted ball, 97ft distance, x12 throws each ball -Body Blade: vertical flexion and abduction, horizontal flexion and abduction, x30 each  09/18/23 -Theraputty: red putty, roll into a ball, flatten into a pancake, roll into a log, tripod pinch x15, lateral pinch x15, roll into a ball, full squeeze x10 -Digit Isolation: each digit into extension in isolation, x8 -Digit Opposition x10 each -Digiflex 3lb, full squeeze x15, each digit x8 -Gripper: 37# 10 medium beads, 38# 5 medium beads -Cards: thumb abduction pushing 1 card at a time and flipping it over x15, Shuffling full deck x5  09/17/23 -Measurements for reassessments -Grooved peg board: using fingers only to manipulate the pegs, not using the box. Taking 5 out at a time (max difficulty).    PATIENT EDUCATION: Education details:Continue HEP Person educated: Patient Education method: Programmer, multimedia, Demonstration, and Handouts Education comprehension: verbalized understanding and returned demonstration  HOME EXERCISE PROGRAM: Eval: finger A/ROM 9/5: Finger Rubber Band Exercises 9/20: shoulder strengthening with dumbbells 9/25: Puzzles, Nuts and bolts 11/4: Card manipulation   GOALS: Goals reviewed with patient? Yes  SHORT TERM GOALS: Target date: 08/15/23  Pt will be provided with and educated on HEP to improve mobility and functional use of right hand during ADLs.   Goal status: IN PROGRESS  2.  Pt will increase right grip strength by  20# and pinch strength by 4# or greater to improve ability to grasp and hold items for hygiene and meal preparation tasks.   Goal status: IN PROGRESS  3.  Pt will increase right fine motor coordination required for tying shoes or manipulating small objects by completing 9 hole peg  test in 45" or less.   Goal status: IN PROGRESS  4.  Pt will be educated on AE available to improve success and independence in ADL completion.   Goal status: IN PROGRESS    ASSESSMENT:  CLINICAL IMPRESSION: Pt presenting with continued limitations of digit isolation and fine motor skills. He required increased time with pinch strengthening and coin manipulation due to difficulty maintaining proper positioning throughout tasks. Pt then working on functional strengthening and stability with the rebounder and body blade. He demonstrated poor overall balance during the rebounder, requiring min guard for stability. Verbal and tactile cuing provided for positioning and technique intermittently throughout session.   PERFORMANCE DEFICITS: in functional skills including ADLs, IADLs, coordination, proprioception, sensation, tone, strength, Fine motor control, and UE functional use    PLAN:  OT FREQUENCY: 2x/week  OT DURATION: 4 weeks  PLANNED INTERVENTIONS: therapeutic exercise, therapeutic activity, neuromuscular re-education, splinting, electrical stimulation, ultrasound, patient/family education, and DME and/or AE instructions, self-care  RECOMMENDED OTHER SERVICES: PT  CONSULTED AND AGREED WITH PLAN OF CARE: Patient  PLAN FOR NEXT SESSION: Follow up on HEP, grip and pinch strengthening, coordination tasks   Trish Mage, OTR/L [phone] 10/02/2023, 10:00 AM

## 2023-10-05 ENCOUNTER — Other Ambulatory Visit (HOSPITAL_COMMUNITY): Payer: Self-pay

## 2023-10-06 ENCOUNTER — Ambulatory Visit (HOSPITAL_COMMUNITY): Payer: Medicare Other | Admitting: Occupational Therapy

## 2023-10-06 ENCOUNTER — Encounter (HOSPITAL_COMMUNITY): Payer: Self-pay | Admitting: Occupational Therapy

## 2023-10-06 ENCOUNTER — Telehealth: Payer: Self-pay | Admitting: Cardiology

## 2023-10-06 ENCOUNTER — Ambulatory Visit (HOSPITAL_COMMUNITY): Payer: Medicare Other

## 2023-10-06 DIAGNOSIS — R278 Other lack of coordination: Secondary | ICD-10-CM

## 2023-10-06 DIAGNOSIS — R29818 Other symptoms and signs involving the nervous system: Secondary | ICD-10-CM

## 2023-10-06 MED ORDER — WARFARIN SODIUM 2.5 MG PO TABS: ORAL_TABLET | ORAL | 5 refills | Status: DC

## 2023-10-06 NOTE — Telephone Encounter (Signed)
Refill request for warfarin:  Last INR was 1.7 on 09/21/23 Next INR due 10/07/23 LOV was 09/07/23  Refill approved.

## 2023-10-06 NOTE — Telephone Encounter (Signed)
*  STAT* If patient is at the pharmacy, call can be transferred to refill team.   1. Which medications need to be refilled? (please list name of each medication and dose if known) warfarin (COUMADIN) 2.5 MG tablet   2. Which pharmacy/location (including street and city if local pharmacy) is medication to be sent to?  [id number] - [place], [place] - [address]      3. Do they need a 30 day or 90 day supply? 90 day  Pt is out of medication

## 2023-10-06 NOTE — Therapy (Unsigned)
OUTPATIENT OCCUPATIONAL THERAPY NEURO TREATMENT   Patient Name: [name] MRN: [id number] DOB:1956-09-01, 67 y.o., male Today's Date: 10/07/2023   END OF SESSION:  OT End of Session - 10/06/23 1154     Visit Number 15    Number of Visits 16    Date for OT Re-Evaluation 10/16/23    Authorization Type 1) Medicare A & B  2) [employer]    Progress Note Due on Visit 20    OT Start Time 1107    OT Stop Time 1154    OT Time Calculation (min) 47 min    Activity Tolerance Patient tolerated treatment well    Behavior During Therapy WFL for tasks assessed/performed              Past Medical History:  Diagnosis Date   Arthritis    Coronary atherosclerosis of native coronary artery    a. NSTEMI (02/2014):  LHC (02/2014):  Mild disease in LAD and CFX; prox RCA occluded with R-R collats, dist RCA filled by L-R collats, inf HK, EF 55%, LVEDP 15 mmHg.  PCI:  Unsuccessful angioplasty of RCA (late presentation of inf MI) - tx medically.   Essential hypertension    GERD (gastroesophageal reflux disease)    Hyperlipidemia    NSTEMI (non-ST elevated myocardial infarction) (HCC) 03/11/14   Renal cell carcinoma of right kidney (HCC)    Partial nephrectomy in 2013   Past Surgical History:  Procedure Laterality Date   AORTIC VALVE REPLACEMENT N/A 05/18/2023   Procedure: AORTIC VALVE REPLACEMENT (AVR) USING INSPIRIS RESILIA 23 MM AORTIC VALVE;  Surgeon: Alleen Borne, MD;  Location: [facility] OR;  Service: Open Heart Surgery;  Laterality: N/A;   BACK SURGERY     1989   CERVICAL DISC SURGERY  04/29/2021   COLONOSCOPY N/A 12/11/2020   Procedure: COLONOSCOPY;  Surgeon: Franky Macho, MD;  Location: [facility];  Service: Gastroenterology;  Laterality: N/A;   LEFT HEART CATHETERIZATION WITH CORONARY ANGIOGRAM N/A 03/17/2014   Procedure: LEFT HEART CATHETERIZATION WITH CORONARY ANGIOGRAM;  Surgeon: Corky Crafts, MD;  Location: [facility] CATH LAB;  Service: Cardiovascular;  Laterality: N/A;    POLYPECTOMY  12/11/2020   Procedure: POLYPECTOMY;  Surgeon: Franky Macho, MD;  Location: [facility];  Service: Gastroenterology;;   REPLACEMENT ASCENDING AORTA N/A 05/18/2023   Procedure: REPLACEMENT OF ASCENDING AORTIC ANEURYSM USING A 30 X 10 MM HEMASHIELD PLATINUM GRAFT;  Surgeon: Alleen Borne, MD;  Location: [facility] OR;  Service: Open Heart Surgery;  Laterality: N/A;  CIRC ARREST   RIGHT HEART CATH AND CORONARY ANGIOGRAPHY N/A 04/03/2023   Procedure: RIGHT HEART CATH AND CORONARY ANGIOGRAPHY;  Surgeon: Tonny Bollman, MD;  Location: [facility] INVASIVE CV LAB;  Service: Cardiovascular;  Laterality: N/A;   ROBOT ASSISTED LAPAROSCOPIC NEPHRECTOMY  01/14/2012   Procedure: ROBOTIC ASSISTED LAPAROSCOPIC NEPHRECTOMY;  Surgeon: Milford Cage, MD;  Location: [facility] ORS;  Service: Urology;  Laterality: Right;  Robot Laparoscopic Right Partial Nephrectomy     TEE WITHOUT CARDIOVERSION N/A 05/18/2023   Procedure: TRANSESOPHAGEAL ECHOCARDIOGRAM;  Surgeon: Alleen Borne, MD;  Location: [facility] OR;  Service: Open Heart Surgery;  Laterality: N/A;   TONSILLECTOMY     Patient Active Problem List   Diagnosis Date Noted   Atrial fibrillation (HCC) 06/08/2023   Encounter for therapeutic drug monitoring 06/08/2023   CVA (cerebral vascular accident) (HCC) 05/25/2023   S/P AVR (aortic valve replacement) 05/18/2023   Chronic cough 02/20/2023   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 [facility]) 02/15/2021   Right leg weakness 02/07/2021   Allodynia 02/07/2021   Gait disturbance 02/07/2021   White matter abnormality on MRI of brain 02/07/2021   Hyperreflexia 02/07/2021   Special screening for malignant neoplasms, colon    Adenomatous polyp of transverse colon    Diverticulosis of large intestine without diverticulitis    PSVT (paroxysmal supraventricular tachycardia) (HCC) 05/29/2014   Precordial pain 04/19/2014   Coronary atherosclerosis of native coronary artery 04/06/2014   Essential  hypertension, benign 04/06/2014   HLD (hyperlipidemia) 04/06/2014   PCP: [name], PA-C REFERRING PROVIDER: Dr. Claudette Laws  ONSET DATE: 05/20/23  REFERRING DIAG: I63.9 (ICD-10-CM) - Cerebrovascular accident (CVA), unspecified mechanism (HCC)   THERAPY DIAG:  Other lack of coordination  Other symptoms and signs involving the nervous system  Rationale for Evaluation and Treatment: Rehabilitation  SUBJECTIVE:   SUBJECTIVE STATEMENT: S: "I just want to be able to peel a potato like I used to"   PERTINENT HISTORY: Pt is a 67 year old male who presented to the [facility] for AVR and ascending aortic aneurysm repair on 05/18/2023. He had evidence of bicuspid aortic valve stenosis with 5.1 cm ascending aortic aneurysm and underwent repair by Dr. Laneta Simmers on 05/18/2023. He was extubated later that day. He had brief episode of hematuria after difficult Foley placement. Remains on Proscar for urinary retention. Intraoperative TEE with EF approximately 50%. Pacer wires and chest tubes removed on 7/03. Chest x-ray was stable and he was transferred out to stepdown unit. Acute blood loss anemia treated with oral iron replacement. Aspirin dosing decreased to 81 mg due to post-operative thrombocytopenia. Later in the evening on 7/03 he was noted to have right hand weakness and rapid response was called. He was noted to have atrial fibrillation with RVR and was hypotensive. He was started on an amiodarone infusion and cardiology consulted. He was transition to amiodarone 200 mg twice daily on 7/04. CT of head without evidence of acute infarct or hemorrhage, however brain MRI showed scattered small acute infarcts in the left cerebellar hemisphere in both cerebral hemispheres likely embolic in etiology.   PRECAUTIONS: Sternal and Fall  WEIGHT BEARING RESTRICTIONS: No  PAIN:  Are you having pain? No  FALLS: Has patient fallen in last 6 months? No  LIVING ENVIRONMENT: Lives with: lives with their  family Lives in: House/apartment Stairs: Yes: External: 2 steps; bilateral but cannot reach both Has following equipment at home: Single point cane and Walker - 2 wheeled  PLOF: Independent  PATIENT GOALS: To get stronger.   OBJECTIVE:   HAND DOMINANCE: Right  ADLs: Overall ADLs: Pt reports he requires increased time for ADLs. Is not driving yet. Pt reports meal preparation is difficult due to being fatigued. Picking up items is difficult, eating with right hand is difficult. Has difficulty with manipulating items.   ACTIVITY TOLERANCE: Activity tolerance: Limited due to recent heart sx  FUNCTIONAL OUTCOME MEASURES: FOTO: 63/100 08/13/23: 62.89/100 09/17/23: 69.43  UPPER EXTREMITY ROM:    BUE A/ROM is [facility]  UPPER EXTREMITY MMT:     MMT Right eval Right 08/13/23 Right 09/17/23  Shoulder flexion 5/5 5/5 5/5  Shoulder abduction 5/5 5/5 5/5  Shoulder internal rotation 5/5 5/5 5/5  Shoulder external rotation 4+/5 4+/5 5/5  Elbow flexion 5/5 5/5 5/5  Elbow extension 5/5 5/5 5/5  Wrist flexion 4/5 4+/5 5/5  Wrist extension 5/5 5/5 5/5  Wrist ulnar deviation 4/5 5/5 5/5  Wrist radial deviation 4/5 4+/5  5/5  Wrist pronation 5/5 5/5 5/5  Wrist supination 5/5 5/5 5/5  (Blank rows = not tested)  HAND FUNCTION: Grip strength: Right: 30 lbs; Left: 80 lbs, Lateral pinch: Right: 9 lbs, Left: 23 lbs, and 3 point pinch: Right: 3 lbs, Left: 12 lbs 08/13/23: Grip Strength: Right: 46 lbs; Lateral pinch: Right: 12 lbs, 3 Point Pinch: Right: 8lbs 09/17/23: Grip Strength: Right: 51 lbs; Lateral pinch: Right: 13 lbs, 3 Point Pinch: Right: 6lbs  COORDINATION: 9 Hole Peg test: Right: 1' 04 sec; Left: 31.42 sec 08/13/23: 9 Hole Peg test: Right: 51.75 sec 09/17/23: 9 Hole Peg test: Right: 56.28 sec  SENSATION: Tingling in hands/feet at baseline  MUSCLE TONE: RUE: Mild    TODAY'S TREATMENT:                                                                                                                               DATE:   10/06/23 -Perfection: using tweezers to place different shapes in their correct holes -Digit Isolation: each digit into extension x10 -Finger Tapping: each digit x8 -Digit flicking: index or middle finger attempting to flick sponge, x10 each finger -Grooved Peg Board -P/ROM: thumb abduction, flexion, opposition, x10 each  10/01/23 -Pinch Strengthening: green, blue, and black resistance clips, x7 each, attempting tip to tip pinch, however continued to "slide" into lateral pinch, placing all clips on to vertical pole, then back down to horizontal pole -Coins: holding 10 coins and placing them in piggy bank, picking up all 10 then placing them  -Rebounder: red and yellow weighted ball, 36ft distance, x12 throws each ball -Body Blade: vertical flexion and abduction, horizontal flexion and abduction, x30 each  09/18/23 -Theraputty: red putty, roll into a ball, flatten into a pancake, roll into a log, tripod pinch x15, lateral pinch x15, roll into a ball, full squeeze x10 -Digit Isolation: each digit into extension in isolation, x8 -Digit Opposition x10 each -Digiflex 3lb, full squeeze x15, each digit x8 -Gripper: 37# 10 medium beads, 38# 5 medium beads -Cards: thumb abduction pushing 1 card at a time and flipping it over x15, Shuffling full deck x5   PATIENT EDUCATION: Education details:Continue HEP Person educated: Patient Education method: Programmer, multimedia, Demonstration, and Handouts Education comprehension: verbalized understanding and returned demonstration  HOME EXERCISE PROGRAM: Eval: finger A/ROM 9/5: Finger Rubber Band Exercises 9/20: shoulder strengthening with dumbbells 9/25: Puzzles, Nuts and bolts 11/4: Card manipulation   GOALS: Goals reviewed with patient? Yes  SHORT TERM GOALS: Target date: 08/15/23  Pt will be provided with and educated on HEP to improve mobility and functional use of right hand during ADLs.   Goal status: IN  PROGRESS  2.  Pt will increase right grip strength by 20# and pinch strength by 4# or greater to improve ability to grasp and hold items for hygiene and meal preparation tasks.   Goal status: IN PROGRESS  3.  Pt will increase right fine motor coordination required for  tying shoes or manipulating small objects by completing 9 hole peg test in 45" or less.   Goal status: IN PROGRESS  4.  Pt will be educated on AE available to improve success and independence in ADL completion.   Goal status: IN PROGRESS    ASSESSMENT:  CLINICAL IMPRESSION: This session, pt continuing to work on finger ROM and isolation, as well as improving coordination/fine motor skills. He continues to struggle with digit isolation and OT added digit flicking this session to continue working on the isolation extension. He was able to demonstrating improving coordination, completing the grooved peg board in a quicker time and less dropping of the grooved pegs. Verbal and tactile cuing provided for positioning and technique throughout session.   PERFORMANCE DEFICITS: in functional skills including ADLs, IADLs, coordination, proprioception, sensation, tone, strength, Fine motor control, and UE functional use    PLAN:  OT FREQUENCY: 2x/week  OT DURATION: 4 weeks  PLANNED INTERVENTIONS: therapeutic exercise, therapeutic activity, neuromuscular re-education, splinting, electrical stimulation, ultrasound, patient/family education, and DME and/or AE instructions, self-care  RECOMMENDED OTHER SERVICES: PT  CONSULTED AND AGREED WITH PLAN OF CARE: Patient  PLAN FOR NEXT SESSION: Follow up on HEP, grip and pinch strengthening, coordination tasks   Trish Mage, OTR/L [phone] 10/07/2023, 2:57 PM

## 2023-10-07 ENCOUNTER — Ambulatory Visit: Payer: Medicare Other | Attending: Cardiology

## 2023-10-07 DIAGNOSIS — Z5181 Encounter for therapeutic drug level monitoring: Secondary | ICD-10-CM | POA: Insufficient documentation

## 2023-10-07 DIAGNOSIS — Z952 Presence of prosthetic heart valve: Secondary | ICD-10-CM | POA: Diagnosis not present

## 2023-10-07 DIAGNOSIS — I480 Paroxysmal atrial fibrillation: Secondary | ICD-10-CM | POA: Insufficient documentation

## 2023-10-07 DIAGNOSIS — I639 Cerebral infarction, unspecified: Secondary | ICD-10-CM | POA: Insufficient documentation

## 2023-10-07 LAB — POCT INR: INR: 1.7 — AB (ref 2.0–3.0)

## 2023-10-07 NOTE — Patient Instructions (Signed)
Description   Take 2 tablets today, then start taking 1.5 tablets daily except 2 tablets on Mondays.   Recheck in 2 weeks

## 2023-10-08 ENCOUNTER — Ambulatory Visit (HOSPITAL_COMMUNITY): Payer: Medicare Other

## 2023-10-12 ENCOUNTER — Encounter (HOSPITAL_COMMUNITY): Payer: Self-pay | Admitting: Occupational Therapy

## 2023-10-12 ENCOUNTER — Ambulatory Visit (HOSPITAL_COMMUNITY): Payer: Medicare Other | Admitting: Physical Therapy

## 2023-10-12 ENCOUNTER — Ambulatory Visit (HOSPITAL_COMMUNITY): Payer: Medicare Other | Admitting: Occupational Therapy

## 2023-10-12 DIAGNOSIS — R278 Other lack of coordination: Secondary | ICD-10-CM

## 2023-10-12 DIAGNOSIS — R29818 Other symptoms and signs involving the nervous system: Secondary | ICD-10-CM

## 2023-10-12 NOTE — Therapy (Signed)
OUTPATIENT OCCUPATIONAL THERAPY NEURO TREATMENT   Patient Name: [name] MRN: [id number] DOB:1956-03-29, 67 y.o., male Today's Date: 10/12/2023   OCCUPATIONAL THERAPY DISCHARGE SUMMARY  Visits from Start of Care: 16  Current functional level related to goals / functional outcomes: Pt has met 4 out of 4 OT goals. He has been provided a comprehensive HEP, his grip and pinch strength are WFL, and he has been educated on AE needed for independent ADL's.    Remaining deficits: Pt continues to be limited in coordination, however improving   Education / Equipment: Pt has been provided a comprehensive HEP.   Plan: Pt agrees to discharge, as he feels that he is near his newer baseline.        END OF SESSION:  OT End of Session - 10/12/23 1236     Visit Number 16    Number of Visits 16    Date for OT Re-Evaluation 10/16/23    Authorization Type 1) Medicare A & B  2) [employer]    Progress Note Due on Visit 20    OT Start Time 1142    OT Stop Time 1217    OT Time Calculation (min) 35 min    Activity Tolerance Patient tolerated treatment well    Behavior During Therapy WFL for tasks assessed/performed               Past Medical History:  Diagnosis Date   Arthritis    Coronary atherosclerosis of native coronary artery    a. NSTEMI (02/2014):  LHC (02/2014):  Mild disease in LAD and CFX; prox RCA occluded with R-R collats, dist RCA filled by L-R collats, inf HK, EF 55%, LVEDP 15 mmHg.  PCI:  Unsuccessful angioplasty of RCA (late presentation of inf MI) - tx medically.   Essential hypertension    GERD (gastroesophageal reflux disease)    Hyperlipidemia    NSTEMI (non-ST elevated myocardial infarction) (HCC) 03/11/14   Renal cell carcinoma of right kidney (HCC)    Partial nephrectomy in 2013   Past Surgical History:  Procedure Laterality Date   AORTIC VALVE REPLACEMENT N/A 05/18/2023   Procedure: AORTIC VALVE REPLACEMENT (AVR) USING INSPIRIS RESILIA 23 MM AORTIC VALVE;   Surgeon: Alleen Borne, MD;  Location: [facility] OR;  Service: Open Heart Surgery;  Laterality: N/A;   BACK SURGERY     1989   CERVICAL DISC SURGERY  04/29/2021   COLONOSCOPY N/A 12/11/2020   Procedure: COLONOSCOPY;  Surgeon: Franky Macho, MD;  Location: [facility];  Service: Gastroenterology;  Laterality: N/A;   LEFT HEART CATHETERIZATION WITH CORONARY ANGIOGRAM N/A 03/17/2014   Procedure: LEFT HEART CATHETERIZATION WITH CORONARY ANGIOGRAM;  Surgeon: Corky Crafts, MD;  Location: [facility] CATH LAB;  Service: Cardiovascular;  Laterality: N/A;   POLYPECTOMY  12/11/2020   Procedure: POLYPECTOMY;  Surgeon: Franky Macho, MD;  Location: [facility];  Service: Gastroenterology;;   REPLACEMENT ASCENDING AORTA N/A 05/18/2023   Procedure: REPLACEMENT OF ASCENDING AORTIC ANEURYSM USING A 30 X 10 MM HEMASHIELD PLATINUM GRAFT;  Surgeon: Alleen Borne, MD;  Location: [facility] OR;  Service: Open Heart Surgery;  Laterality: N/A;  CIRC ARREST   RIGHT HEART CATH AND CORONARY ANGIOGRAPHY N/A 04/03/2023   Procedure: RIGHT HEART CATH AND CORONARY ANGIOGRAPHY;  Surgeon: Tonny Bollman, MD;  Location: [facility] INVASIVE CV LAB;  Service: Cardiovascular;  Laterality: N/A;   ROBOT ASSISTED LAPAROSCOPIC NEPHRECTOMY  01/14/2012   Procedure: ROBOTIC ASSISTED LAPAROSCOPIC NEPHRECTOMY;  Surgeon: Milford Cage, MD;  Location:  [facility] ORS;  Service: Urology;  Laterality: Right;  Robot Laparoscopic Right Partial Nephrectomy     TEE WITHOUT CARDIOVERSION N/A 05/18/2023   Procedure: TRANSESOPHAGEAL ECHOCARDIOGRAM;  Surgeon: Alleen Borne, MD;  Location: [facility] OR;  Service: Open Heart Surgery;  Laterality: N/A;   TONSILLECTOMY     Patient Active Problem List   Diagnosis Date Noted   Atrial fibrillation (HCC) 06/08/2023   Encounter for therapeutic drug monitoring 06/08/2023   CVA (cerebral vascular accident) (HCC) 05/25/2023   S/P AVR (aortic valve replacement) 05/18/2023   Chronic cough 02/20/2023   Myelopathy concurrent with and  due to spinal stenosis of cervical region [facility]) 02/15/2021   Right leg weakness 02/07/2021   Allodynia 02/07/2021   Gait disturbance 02/07/2021   White matter abnormality on MRI of brain 02/07/2021   Hyperreflexia 02/07/2021   Special screening for malignant neoplasms, colon    Adenomatous polyp of transverse colon    Diverticulosis of large intestine without diverticulitis    PSVT (paroxysmal supraventricular tachycardia) (HCC) 05/29/2014   Precordial pain 04/19/2014   Coronary atherosclerosis of native coronary artery 04/06/2014   Essential hypertension, benign 04/06/2014   HLD (hyperlipidemia) 04/06/2014   PCP: [name], PA-C REFERRING PROVIDER: Dr. Claudette Laws  ONSET DATE: 05/20/23  REFERRING DIAG: I63.9 (ICD-10-CM) - Cerebrovascular accident (CVA), unspecified mechanism (HCC)   THERAPY DIAG:  Other lack of coordination  Other symptoms and signs involving the nervous system  Rationale for Evaluation and Treatment: Rehabilitation  SUBJECTIVE:   SUBJECTIVE STATEMENT: S: "If my back was good, I'd be good"   PERTINENT HISTORY: Pt is a 67 year old male who presented to the [facility] for AVR and ascending aortic aneurysm repair on 05/18/2023. He had evidence of bicuspid aortic valve stenosis with 5.1 cm ascending aortic aneurysm and underwent repair by Dr. Laneta Simmers on 05/18/2023. He was extubated later that day. He had brief episode of hematuria after difficult Foley placement. Remains on Proscar for urinary retention. Intraoperative TEE with EF approximately 50%. Pacer wires and chest tubes removed on 7/03. Chest x-ray was stable and he was transferred out to stepdown unit. Acute blood loss anemia treated with oral iron replacement. Aspirin dosing decreased to 81 mg due to post-operative thrombocytopenia. Later in the evening on 7/03 he was noted to have right hand weakness and rapid response was called. He was noted to have atrial fibrillation with RVR and was hypotensive. He was  started on an amiodarone infusion and cardiology consulted. He was transition to amiodarone 200 mg twice daily on 7/04. CT of head without evidence of acute infarct or hemorrhage, however brain MRI showed scattered small acute infarcts in the left cerebellar hemisphere in both cerebral hemispheres likely embolic in etiology.   PRECAUTIONS: Sternal and Fall  WEIGHT BEARING RESTRICTIONS: No  PAIN:  Are you having pain? No  FALLS: Has patient fallen in last 6 months? No  LIVING ENVIRONMENT: Lives with: lives with their family Lives in: House/apartment Stairs: Yes: External: 2 steps; bilateral but cannot reach both Has following equipment at home: Single point cane and Walker - 2 wheeled  PLOF: Independent  PATIENT GOALS: To get stronger.   OBJECTIVE:   HAND DOMINANCE: Right  ADLs: Overall ADLs: Pt reports he requires increased time for ADLs. Is not driving yet. Pt reports meal preparation is difficult due to being fatigued. Picking up items is difficult, eating with right hand is difficult. Has difficulty with manipulating items.   ACTIVITY TOLERANCE: Activity tolerance: Limited due to recent heart  sx  FUNCTIONAL OUTCOME MEASURES: FOTO: 63/100 08/13/23: 62.89/100 09/17/23: 69.43 10/12/23: 76.81/100  UPPER EXTREMITY ROM:    BUE A/ROM is [facility]  UPPER EXTREMITY MMT:     MMT Right eval Right 08/13/23 Right 09/17/23 Right 10/12/23  Shoulder flexion 5/5 5/5 5/5 5/5  Shoulder abduction 5/5 5/5 5/5 5/5  Shoulder internal rotation 5/5 5/5 5/5 5/5  Shoulder external rotation 4+/5 4+/5 5/5 5/5  Elbow flexion 5/5 5/5 5/5 5/5  Elbow extension 5/5 5/5 5/5 5/5  Wrist flexion 4/5 4+/5 5/5 5/5  Wrist extension 5/5 5/5 5/5 5/5  Wrist ulnar deviation 4/5 5/5 5/5 5/5  Wrist radial deviation 4/5 4+/5 5/5 5/5  Wrist pronation 5/5 5/5 5/5 5/5  Wrist supination 5/5 5/5 5/5 5/5  (Blank rows = not tested)  HAND FUNCTION: Grip strength: Right: 30 lbs; Left: 80 lbs, Lateral pinch: Right:  9 lbs, Left: 23 lbs, and 3 point pinch: Right: 3 lbs, Left: 12 lbs 08/13/23: Grip Strength: Right: 46 lbs; Lateral pinch: Right: 12 lbs, 3 Point Pinch: Right: 8lbs 09/17/23: Grip Strength: Right: 51 lbs; Lateral pinch: Right: 13 lbs, 3 Point Pinch: Right: 6lbs 10/12/23: Grip Strength: Right: 55 lbs; Lateral pinch: Right: 14 lbs, 3 Point Pinch: Right: 10lbs  COORDINATION: 9 Hole Peg test: Right: 1' 04 sec; Left: 31.42 sec 08/13/23: 9 Hole Peg test: Right: 51.75 sec 09/17/23: 9 Hole Peg test: Right: 56.28 sec 10/12/23: 9 Hole Peg test: Right: 45.13 sec  SENSATION: Tingling in hands/feet at baseline  MUSCLE TONE: RUE: Mild    TODAY'S TREATMENT:                                                                                                                              DATE:   10/12/23 -A/ROM: shoulder flexion, abduction, protraction, horizontal abduction, er/IR, x10 -Digit ROM: composite flexion, abduction, finger taps, opposition, x10 -9 hole peg test -Review HEP -Measurements for reassessment  10/06/23 -Perfection: using tweezers to place different shapes in their correct holes -Digit Isolation: each digit into extension x10 -Finger Tapping: each digit x8 -Digit flicking: index or middle finger attempting to flick sponge, x10 each finger -Grooved Peg Board -P/ROM: thumb abduction, flexion, opposition, x10 each  10/01/23 -Pinch Strengthening: green, blue, and black resistance clips, x7 each, attempting tip to tip pinch, however continued to "slide" into lateral pinch, placing all clips on to vertical pole, then back down to horizontal pole -Coins: holding 10 coins and placing them in piggy bank, picking up all 10 then placing them  -Rebounder: red and yellow weighted ball, 43ft distance, x12 throws each ball -Body Blade: vertical flexion and abduction, horizontal flexion and abduction, x30 each   PATIENT EDUCATION: Education details:Continue HEP Person educated:  Patient Education method: Explanation, Demonstration, and Handouts Education comprehension: verbalized understanding and returned demonstration  HOME EXERCISE PROGRAM: Eval: finger A/ROM 9/5: Finger Rubber Band Exercises 9/20: shoulder strengthening with dumbbells 9/25: Puzzles, Nuts and bolts 11/4: Card manipulation  GOALS: Goals reviewed with patient? Yes  SHORT TERM GOALS: Target date: 08/15/23  Pt will be provided with and educated on HEP to improve mobility and functional use of right hand during ADLs.   Goal status: MET  2.  Pt will increase right grip strength by 20# and pinch strength by 4# or greater to improve ability to grasp and hold items for hygiene and meal preparation tasks.   Goal status: MET  3.  Pt will increase right fine motor coordination required for tying shoes or manipulating small objects by completing 9 hole peg test in 45" or less.   Goal status: MET  4.  Pt will be educated on AE available to improve success and independence in ADL completion.   Goal status: MET    ASSESSMENT:  CLINICAL IMPRESSION: Pt presenting to OT session for reassessment. He is demonstrating good maintenance with overall mobility and UE strength. His grip and pinch strength have incrementally improved, as well as coordination. At this time he is achieving most of his OT goals and feels that he is ready to discharge from skilled OT. All of pt's questions and concerns have been answered and pt will be discharged from Outpatient OT.   PERFORMANCE DEFICITS: in functional skills including ADLs, IADLs, coordination, proprioception, sensation, tone, strength, Fine motor control, and UE functional use    PLAN:  OT FREQUENCY: 2x/week  OT DURATION: 4 weeks  PLANNED INTERVENTIONS: therapeutic exercise, therapeutic activity, neuromuscular re-education, splinting, electrical stimulation, ultrasound, patient/family education, and DME and/or AE instructions,  self-care  RECOMMENDED OTHER SERVICES: PT  CONSULTED AND AGREED WITH PLAN OF CARE: Patient  PLAN FOR NEXT SESSION:  Discharge   Trish Mage, OTR/L [phone] 10/12/2023, 12:37 PM

## 2023-10-20 ENCOUNTER — Ambulatory Visit (INDEPENDENT_AMBULATORY_CARE_PROVIDER_SITE_OTHER): Payer: Medicare Other | Admitting: Neurology

## 2023-10-20 ENCOUNTER — Encounter: Payer: Self-pay | Admitting: Neurology

## 2023-10-20 VITALS — BP 175/107 | HR 60 | Ht 68.0 in | Wt 158.5 lb

## 2023-10-20 DIAGNOSIS — R208 Other disturbances of skin sensation: Secondary | ICD-10-CM

## 2023-10-20 DIAGNOSIS — R292 Abnormal reflex: Secondary | ICD-10-CM

## 2023-10-20 DIAGNOSIS — G992 Myelopathy in diseases classified elsewhere: Secondary | ICD-10-CM

## 2023-10-20 DIAGNOSIS — R269 Unspecified abnormalities of gait and mobility: Secondary | ICD-10-CM

## 2023-10-20 DIAGNOSIS — M4802 Spinal stenosis, cervical region: Secondary | ICD-10-CM

## 2023-10-20 DIAGNOSIS — M51369 Other intervertebral disc degeneration, lumbar region without mention of lumbar back pain or lower extremity pain: Secondary | ICD-10-CM

## 2023-10-20 MED ORDER — BACLOFEN 10 MG PO TABS: ORAL_TABLET | ORAL | Status: DC

## 2023-10-20 NOTE — Progress Notes (Signed)
[facility]  PATIENT: [name] DOB: Mar 15, 1956  REFERRING DOCTOR OR PCP:  [name], PA-C SOURCE: Patient, notes from primary care, imaging reports, MRI images (brain and lumbar spine) personally reviewed.  _________________________________   HISTORICAL  CHIEF COMPLAINT:  Chief Complaint  Patient presents with   Room 10    Pt is here Alone. Pt states that he had a stroke after his heart surgery in July. Pt states that he did PT after his stroke and he doesn't have much strength in his right hand.    HISTORY OF PRESENT ILLNESS:  [name], is a 67 y.o. man with cervical myelopathy:  UPDATE 10/20/2023 MRI of the cervical spine 02/15/2021 showed myelopathy at C4C5 and he underwent spine surgery 04/29/2021 (Dr. Maurice Small).  He denied any permanent improvement in the arms.     Legs never showed improvement.   .    He has right leg weakness and has a foot drop on the right.   He has tried PT  He  notes right (dominant) hand has reduced use due to mild weakness and reduced RAM, coordination.     We tried baclofen but he does not thik it has helped  He has discomfort and tingling in the right leg.    He has difficulty raising the right leg up   He also has tingling and allodynia (touch is mildly tender) in the outer left leg in the thigh lower leg into the foot.     He has had urinary urgency, worse the last year.  He has urgency and rare incontinence.   He also notes some constipation.    Vision is fine.     He also has some LBP.   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  He had renal cell carcinoma and is status post nephrectomy.  He is on Lipitor, losartan and metoprolol.   Also on finasteride for BPH.     From Consult 02/07/2021: He has a history of lumbar DJD and surgery who began to experience leg numbness and right leg weakness last summer.   Specifically noted reduced ability to  lift the right leg.  This affected his gait.  When sitting down, he has trouble raising the right foot towards the left knee.  He notes altered sensation in the legs.  Additionally, he has chronic tingling/numbness in his right hand bit more recently has had tingling in the hand.  He had surgery in 1988.    He also has known cervical DJD.  He has had right hand numbness since the late 1990's and MRi at the time showed disc pathology but he opted not to do surgery.   He throws his lower back about once or twice a year but is better a couple days to a week later.    I personally reviewed the recent MRIs.  I discussed the results with him and his wife.  I believe the changes in the brain are much more consistent with mild age-related chronic microvascular ischemic change rather than MS.  Specifically, there is nothing in the brain that should explain right leg weakness.  The MRI of the lumbar spine shows multilevel degenerative changes and there is some encroachment on nerve roots as detailed below.  Imaging: MRI cervical spine 12/18/2021 (postoperative) shows C4-C5 ACDF.  There is decompression at that level compared to the 02/14/2021 MRI there is no longer  spinal stenosis.  The myelopathy signal changes are still present at C4-C5 though they do not appear to be any worse.  Adjacent level disease is noted.  At C3-C4, there has been slight progression of the mild spinal stenosis.  There is no myelopathic signal.  There could be left C4 nerve root compression at this level.  There is mild spinal stenosis at C5-C6 that persists but does not appear to be worse of note, there is prominent ligamenta flava hypertrophy on the left but no abnormal spinal cord signal is at C5-C6.   MRI Cervical spine 02/14/2021 showed: 1.    T2 hyperintense signal within the spinal cord adjacent to C4-C5 involving bilateral gray matter is most consistent with compressive myelopathy.  At this level, there is mild anterolisthesis and other  degenerative changes causing mild to moderate spinal stenosis.  There is foraminal narrowing but no nerve root compression. 2.    At C3-C4, there is borderline anterolisthesis due to degenerative changes causing mild spinal stenosis and foraminal narrowing but no nerve root compression. 3.    At C5-C6, there is mild spinal stenosis due to degenerative changes but no nerve root compression. 4.    At C6-C7, there is borderline spinal stenosis but no nerve root compression.  MRI thoracic spine showed   The spinal cord appears normal.     Minimal degenerative changes at T9-T10 and T11-T12 that do not lead to spinal stenosis or nerve root compression.  MRI of the brain 01/29/2021 shows scattered T2/FLAIR hyperintense foci, predominantly in the subcortical and deep white matter of the hemispheres with a few foci in the pons and one in the cerebellum.  Of note, none of the foci appears to be juxtacortical or in the callososeptal fibers.   MRI of the lumbar spine 01/29/2021 shows multilevel degenerative changes.  At L2-L3 there is moderate left lateral recess stenosis with some encroachment on the left L3 nerve root.  At L3-L4, there is no significant nerve root encroachment.  At L4-L5, there are degenerative changes causing mild spinal stenosis with moderately severe right foraminal and lateral recess stenosis and moderate left foraminal and lateral recess stenosis.  There is potential for right L4 and L5 nerve root compression.  At L5-S1, there is moderate right lateral recess stenosis that could affect the right S1 nerve root.   REVIEW OF SYSTEMS: Constitutional: No fevers, chills, sweats, or change in appetite Eyes: No visual changes, double vision, eye pain Ear, nose and throat: No hearing loss, ear pain, nasal congestion, sore throat Cardiovascular: No chest pain, palpitations Respiratory:  No shortness of breath at rest or with exertion.   No wheezes GastrointestinaI: No nausea, vomiting, diarrhea,  abdominal pain, fecal incontinence Genitourinary:  No dysuria, urinary retention or frequency.  No nocturia. Musculoskeletal: He notes crunching in his neck when he moves it at times  He has lower back pain.   Ntegumentary: No rash, pruritus, skin lesions Neurological: as above Psychiatric: No depression at this time.  No anxiety Endocrine: No palpitations, diaphoresis, change in appetite, change in weigh or increased thirst Hematologic/Lymphatic:  No anemia, purpura, petechiae. Allergic/Immunologic: No itchy/runny eyes, nasal congestion, recent allergic reactions, rashes  ALLERGIES: Allergies  Allergen Reactions   Alcohol Other (See Comments)    Pts skin gets red. Ex. Pt used alcohol based deodorant and got red things under arms. & Drinks alcohol notices "from heart on up" everything turns red.   Chlorthalidone     Dizziness and Syncopal Episode  HOME MEDICATIONS:  Current Outpatient Medications:    acetaminophen (TYLENOL) 325 MG tablet, Take 1-2 tablets (325-650 mg total) by mouth every 4 (four) hours as needed for mild pain., Disp: , Rfl:    aspirin EC 81 MG EC tablet, Take 1 tablet (81 mg total) by mouth daily., Disp: , Rfl:    atorvastatin (LIPITOR) 80 MG tablet, Take 1 tablet (80 mg total) by mouth daily., Disp: 90 tablet, Rfl: 3   finasteride (PROSCAR) 5 MG tablet, Take 1 tablet (5 mg total) by mouth daily., Disp: 30 tablet, Rfl: 0   metoprolol tartrate (LOPRESSOR) 25 MG tablet, Take 1 tablet (25 mg total) by mouth 2 (two) times daily., Disp: 180 tablet, Rfl: 1   warfarin (COUMADIN) 2.5 MG tablet, Take 1 to 2 tablets daily or as directed by coumadin clinic, Disp: 45 tablet, Rfl: 5   amoxicillin (AMOXIL) 500 MG capsule, Take 4 capsules (2,000 mg total) by mouth as directed. 30-60 minutes before dental procedures (Patient not taking: Reported on 10/20/2023), Disp: 4 capsule, Rfl: 2   baclofen (LIORESAL) 10 MG tablet, One or two po tid up to 6/day, Disp: , Rfl:    bisacodyl  (DULCOLAX) 5 MG EC tablet, Take 1 tablet (5 mg total) by mouth daily at 2 PM. (Patient taking differently: Take 5 mg by mouth daily at 2 PM. Liquid form, every other day), Disp: , Rfl:   PAST MEDICAL HISTORY: Past Medical History:  Diagnosis Date   Arthritis    Coronary atherosclerosis of native coronary artery    a. NSTEMI (02/2014):  LHC (02/2014):  Mild disease in LAD and CFX; prox RCA occluded with R-R collats, dist RCA filled by L-R collats, inf HK, EF 55%, LVEDP 15 mmHg.  PCI:  Unsuccessful angioplasty of RCA (late presentation of inf MI) - tx medically.   Essential hypertension    GERD (gastroesophageal reflux disease)    Hyperlipidemia    NSTEMI (non-ST elevated myocardial infarction) (HCC) 03/11/14   Renal cell carcinoma of right kidney (HCC)    Partial nephrectomy in 2013    PAST SURGICAL HISTORY: Past Surgical History:  Procedure Laterality Date   AORTIC VALVE REPLACEMENT N/A 05/18/2023   Procedure: AORTIC VALVE REPLACEMENT (AVR) USING INSPIRIS RESILIA 23 MM AORTIC VALVE;  Surgeon: Alleen Borne, MD;  Location: [facility] OR;  Service: Open Heart Surgery;  Laterality: N/A;   BACK SURGERY     1989   CERVICAL DISC SURGERY  04/29/2021   COLONOSCOPY N/A 12/11/2020   Procedure: COLONOSCOPY;  Surgeon: Franky Macho, MD;  Location: [facility];  Service: Gastroenterology;  Laterality: N/A;   LEFT HEART CATHETERIZATION WITH CORONARY ANGIOGRAM N/A 03/17/2014   Procedure: LEFT HEART CATHETERIZATION WITH CORONARY ANGIOGRAM;  Surgeon: Corky Crafts, MD;  Location: [facility] CATH LAB;  Service: Cardiovascular;  Laterality: N/A;   POLYPECTOMY  12/11/2020   Procedure: POLYPECTOMY;  Surgeon: Franky Macho, MD;  Location: [facility];  Service: Gastroenterology;;   REPLACEMENT ASCENDING AORTA N/A 05/18/2023   Procedure: REPLACEMENT OF ASCENDING AORTIC ANEURYSM USING A 30 X 10 MM HEMASHIELD PLATINUM GRAFT;  Surgeon: Alleen Borne, MD;  Location: [facility] OR;  Service: Open Heart Surgery;  Laterality: N/A;   CIRC ARREST   RIGHT HEART CATH AND CORONARY ANGIOGRAPHY N/A 04/03/2023   Procedure: RIGHT HEART CATH AND CORONARY ANGIOGRAPHY;  Surgeon: Tonny Bollman, MD;  Location: [facility] INVASIVE CV LAB;  Service: Cardiovascular;  Laterality: N/A;   ROBOT ASSISTED LAPAROSCOPIC NEPHRECTOMY  01/14/2012   Procedure: ROBOTIC ASSISTED  LAPAROSCOPIC NEPHRECTOMY;  Surgeon: Milford Cage, MD;  Location: [facility] ORS;  Service: Urology;  Laterality: Right;  Robot Laparoscopic Right Partial Nephrectomy     TEE WITHOUT CARDIOVERSION N/A 05/18/2023   Procedure: TRANSESOPHAGEAL ECHOCARDIOGRAM;  Surgeon: Alleen Borne, MD;  Location: [facility] OR;  Service: Open Heart Surgery;  Laterality: N/A;   TONSILLECTOMY      FAMILY HISTORY: Family History  Problem Relation Age of Onset   Renal Disease Daughter    Hypertension Daughter     SOCIAL HISTORY:  Social History   Socioeconomic History   Marital status: Married    Spouse name: Not on file   Number of children: 1   Years of education: 14   Highest education level: Not on file  Occupational History   Occupation: Retired   Tobacco Use   Smoking status: Former    Current packs/day: 0.00    Types: Cigarettes    Quit date: 11/18/1979    Years since quitting: 43.9   Smokeless tobacco: Never  Vaping Use   Vaping status: Never Used  Substance and Sexual Activity   Alcohol use: No    Alcohol/week: 0.0 standard drinks of alcohol   Drug use: Yes    Frequency: 7.0 times per week    Types: Marijuana    Comment: every day   Sexual activity: Not on file  Other Topics Concern   Not on file  Social History Narrative   Lives w/ wife   Caffeine use: 1 cup coffee every morning   Right handed   Social Determinants of Health   Financial Resource Strain: Not on file  Food Insecurity: No Food Insecurity (05/18/2023)   Hunger Vital Sign    Worried About Running Out of Food in the Last Year: Never true    Ran Out of Food in the Last Year: Never true  Transportation Needs:  No Transportation Needs (05/18/2023)   PRAPARE - Administrator, Civil Service (Medical): No    Lack of Transportation (Non-Medical): No  Physical Activity: Not on file  Stress: Not on file  Social Connections: Not on file  Intimate Partner Violence: Not At Risk (05/18/2023)   Humiliation, Afraid, Rape, and Kick questionnaire    Fear of Current or Ex-Partner: No    Emotionally Abused: No    Physically Abused: No    Sexually Abused: No     PHYSICAL EXAM  Vitals:   10/20/23 1029  BP: (!) 175/107  Pulse: 60  Weight: 158 lb 8 oz (71.9 kg)  Height: 5\' 8"  (1.727 m)     Body mass index is 24.1 kg/m.   General: The patient is well-developed and well-nourished and in no acute distress  HEENT:  Head is [place]/AT.  Sclera are anicteric.    Neck: The neck is nontender.  He has a well-healed scar from his surgery last year.  Range of motion appears to be fairly normal in the neck.  Cardiovascular: The heart has a regular rate and rhythm with a normal S1 and S2. There were no murmurs, gallops or rubs.    Skin: Extremities are without rash or  edema.  Musculoskeletal: There is no tenderness in the back or legs.  Neurologic Exam  Mental status: The patient is alert and oriented x 3 at the time of the examination. The patient has apparent normal recent and remote memory, with an apparently normal attention span and concentration ability.   Speech is normal.  Cranial nerves: Extraocular movements are  full.  Facial strength and sensation was normal.  No obvious hearing deficits are noted.  Motor:  Muscle bulk is normal.   Tone is increased in the right leg much more than the left leg. Strength is  5 / 5 in arms and left leg but 4/5 in the right hip flexion and ankle/toe and 4+/5 elsewhere  Sensory: Sensory testing is intact to pinprick, soft touch and vibration sensation in the arms.  However, he has reduced vibration sensation in the right leg relative to the left.  No dermatomal  difference between L4, L5 and S1.Marland Kitchen  Coordination: Cerebellar testing reveals good finger-nose-finger and heel-to-shin bilaterally.  Gait and station: Station is normal.   His gait is spastic and has mild right foot drop.  . Tandem gait is very poor. Romberg is negative.   Reflexes: Deep tendon reflexes are symmetric and normal in arms but increased in legs (spread at knees and 2 beats nonsustained clonus.  The reflexes were more on the right than the left.   Plantar responses are equivocal on the right and negative on the left.    DIAGNOSTIC DATA (LABS, IMAGING, TESTING) - I reviewed patient records, labs, notes, testing and imaging myself where available.  Lab Results  Component Value Date   WBC 9.5 06/01/2023   HGB 9.7 (L) 06/01/2023   HCT 30.7 (L) 06/01/2023   MCV 94.2 06/01/2023   PLT 355 06/01/2023      Component Value Date/Time   NA 135 06/01/2023 0701   NA 141 03/23/2023 1236   K 4.0 06/01/2023 0701   CL 104 06/01/2023 0701   CO2 21 (L) 06/01/2023 0701   GLUCOSE 106 (H) 06/01/2023 0701   BUN 24 (H) 06/01/2023 0701   BUN 14 03/23/2023 1236   CREATININE 1.04 06/01/2023 0701   CREATININE 0.94 09/20/2020 0820   CALCIUM 8.7 (L) 06/01/2023 0701   PROT 5.4 (L) 05/26/2023 0544   PROT 6.7 12/04/2022 0856   ALBUMIN 2.9 (L) 05/26/2023 0544   ALBUMIN 4.5 12/04/2022 0856   AST 20 05/26/2023 0544   ALT 27 05/26/2023 0544   ALKPHOS 56 05/26/2023 0544   BILITOT 1.1 05/26/2023 0544   BILITOT 0.7 12/04/2022 0856   GFRNONAA >60 06/01/2023 0701   GFRAA >90 04/24/2014 2220   Lab Results  Component Value Date   CHOL 77 05/22/2023   HDL 31 (L) 05/22/2023   LDLCALC 28 05/22/2023   TRIG 88 05/22/2023   CHOLHDL 2.5 05/22/2023   Lab Results  Component Value Date   HGBA1C 5.8 (H) 05/14/2023       ASSESSMENT AND PLAN  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 [facility])  Hyperreflexia  Gait disturbance  Allodynia  Degeneration of  intervertebral disc of lumbar region, unspecified whether pain present  Due to cervical myelopathy, he continues to experience difficulty with gait and has spasticity in the right leg associated with weakness.    Increase Baclofen up to 60 mg/day for right > left leg spasticity Stay active and exercise Consider an anticholinergic for bladder Back and leg pain worsens he would need to reconsider having lumbar surgery.  He has spoken to Dr. Shon Baton in the past.   Return in 12 months or sooner if there are new or worsening neurologic symptoms.   Moncerrat Burnstein A. Epimenio Foot, MD, [facility] 10/20/2023, 11:10 AM Certified in Neurology, Clinical Neurophysiology, Sleep Medicine and Neuroimaging  [facility] [address], [address] [place], [place] [address] [phone]

## 2023-10-21 ENCOUNTER — Ambulatory Visit: Payer: Medicare Other | Attending: Cardiology | Admitting: *Deleted

## 2023-10-21 DIAGNOSIS — I480 Paroxysmal atrial fibrillation: Secondary | ICD-10-CM | POA: Insufficient documentation

## 2023-10-21 DIAGNOSIS — I639 Cerebral infarction, unspecified: Secondary | ICD-10-CM | POA: Diagnosis not present

## 2023-10-21 DIAGNOSIS — Z952 Presence of prosthetic heart valve: Secondary | ICD-10-CM | POA: Insufficient documentation

## 2023-10-21 DIAGNOSIS — Z5181 Encounter for therapeutic drug level monitoring: Secondary | ICD-10-CM | POA: Insufficient documentation

## 2023-10-21 LAB — POCT INR: INR: 1.5 — AB (ref 2.0–3.0)

## 2023-10-21 NOTE — Patient Instructions (Signed)
Increase warfarin to 2 tablets daily except 1 1/2 tablets on Sundays, Tuesdays and Thursdays Recheck in 2 weeks

## 2023-10-29 ENCOUNTER — Telehealth: Payer: Self-pay | Admitting: Neurology

## 2023-10-29 NOTE — Telephone Encounter (Signed)
Spoke with the patient.  He states he was originally taking one 10 mg tablet daily and then at the end of July after his heart surgery, he took 10 mg twice daily.  After his last appointment, on 12/4 he started taking 20 mg TID. In the last 2 days he has been very tired, difficulty walking, sleepiness, dropping his phone and nodding off around 10 pm while organizing his baseball cards. He did not gradually increase the dose to this. He is saying he will stop it completely as he doesn't feel it has ever really been helpful. He came off it for 4 months previously and states there was no difference.  His schedule has been: 2 tablets 7 am 2 tablets 4 pm 2 tablets 8-9 pm   I told him I would discuss with Dr Epimenio Foot and then call him back. I warned him against abruptly stopping this medication without guidance. He was appreciative.

## 2023-10-29 NOTE — Telephone Encounter (Signed)
I spoke with the patient and provided him the instructions below for weaning off the medication.  The patient verbalized understanding that if he intends to wean off completely he will drop total pills per day by 1 pill every 3-4 days until he is off and this will take approximately 2 weeks. The patient said he will be very careful coming down and we did discuss that if he reduces his dose enough that he has no side effects and he feels his twitching is controlled then he can contact us about staying at that dose. The patient verbalized understanding and stated he may end up feeling the 1 tablet TID works since he never tried that. He thanked me for the call and I encouraged him to call us with any questions or concerns.

## 2023-10-29 NOTE — Telephone Encounter (Signed)
Pt called requesting to speak to the MD regarding the increase he was given of his  baclofen (LIORESAL) 10 MG tablet  Pt states that he has started to nod off and falling asleep all the time and he is wanting to confirm if the falling asleep all the time suddenly, is due to the dosage increased.

## 2023-10-29 NOTE — Telephone Encounter (Signed)
From Dr Epimenio Foot:  Molli Knock.  If he does stop the baclofen needs to try to come off of it over a couple weeks by slowly lowering the dose (can reduce the number of pills per day by 1 every 3 or 4 days until he is done)

## 2023-11-03 ENCOUNTER — Ambulatory Visit: Payer: Medicare Other | Attending: Cardiology | Admitting: *Deleted

## 2023-11-03 DIAGNOSIS — Z5181 Encounter for therapeutic drug level monitoring: Secondary | ICD-10-CM | POA: Insufficient documentation

## 2023-11-03 DIAGNOSIS — Z952 Presence of prosthetic heart valve: Secondary | ICD-10-CM | POA: Diagnosis present

## 2023-11-03 DIAGNOSIS — I480 Paroxysmal atrial fibrillation: Secondary | ICD-10-CM | POA: Insufficient documentation

## 2023-11-03 DIAGNOSIS — I639 Cerebral infarction, unspecified: Secondary | ICD-10-CM | POA: Insufficient documentation

## 2023-11-03 LAB — POCT INR: INR: 1.7 — AB (ref 2.0–3.0)

## 2023-11-03 NOTE — Patient Instructions (Signed)
Increase warfarin to 2 tablets daily except 2 1/2 on Tuesdays and Saturdays Recheck in 2 weeks

## 2023-11-20 ENCOUNTER — Ambulatory Visit: Payer: Medicare Other | Attending: Cardiology | Admitting: *Deleted

## 2023-11-20 DIAGNOSIS — I480 Paroxysmal atrial fibrillation: Secondary | ICD-10-CM | POA: Diagnosis present

## 2023-11-20 DIAGNOSIS — Z952 Presence of prosthetic heart valve: Secondary | ICD-10-CM

## 2023-11-20 DIAGNOSIS — I639 Cerebral infarction, unspecified: Secondary | ICD-10-CM

## 2023-11-20 DIAGNOSIS — Z5181 Encounter for therapeutic drug level monitoring: Secondary | ICD-10-CM | POA: Diagnosis present

## 2023-11-20 LAB — POCT INR: INR: 2.8 (ref 2.0–3.0)

## 2023-11-20 NOTE — Patient Instructions (Signed)
 Continue warfarin 2 tablets daily except 2 1/2 on Tuesdays and Saturdays Recheck in 3 weeks

## 2023-12-07 ENCOUNTER — Ambulatory Visit: Payer: Medicare Other | Attending: Cardiology | Admitting: *Deleted

## 2023-12-07 DIAGNOSIS — Z952 Presence of prosthetic heart valve: Secondary | ICD-10-CM | POA: Diagnosis present

## 2023-12-07 DIAGNOSIS — I480 Paroxysmal atrial fibrillation: Secondary | ICD-10-CM | POA: Insufficient documentation

## 2023-12-07 DIAGNOSIS — Z5181 Encounter for therapeutic drug level monitoring: Secondary | ICD-10-CM | POA: Insufficient documentation

## 2023-12-07 DIAGNOSIS — I639 Cerebral infarction, unspecified: Secondary | ICD-10-CM | POA: Diagnosis not present

## 2023-12-07 LAB — POCT INR: INR: 2.5 (ref 2.0–3.0)

## 2023-12-07 NOTE — Patient Instructions (Signed)
Continue warfarin 2 tablets daily except 2 1/2 on Tuesdays and Saturdays Recheck in 4 weeks

## 2023-12-15 ENCOUNTER — Encounter: Payer: Self-pay | Admitting: Physical Medicine & Rehabilitation

## 2023-12-15 ENCOUNTER — Encounter: Payer: Medicare Other | Attending: Physical Medicine & Rehabilitation | Admitting: Physical Medicine & Rehabilitation

## 2023-12-15 VITALS — BP 145/89 | HR 63 | Ht 68.0 in | Wt 165.8 lb

## 2023-12-15 DIAGNOSIS — I69351 Hemiplegia and hemiparesis following cerebral infarction affecting right dominant side: Secondary | ICD-10-CM | POA: Insufficient documentation

## 2023-12-15 NOTE — Progress Notes (Signed)
Subjective:    Patient ID: [name], male    DOB: 29-May-1956, 68 y.o.   MRN: [id number]  68 y.o. male who presented to the [facility] for AVR and ascending aortic aneurysm repair on 05/18/2023. He had evidence of bicuspid aortic valve stenosis with 5.1 cm ascending aortic aneurysm and underwent repair by Dr. Laneta Simmers on 05/18/2023. He was extubated later that day. He had brief episode of hematuria after difficult Foley placement. Remains on Proscar for urinary retention. Intraoperative TEE with EF approximately 50%. Pacer wires and chest tubes removed on 7/03. Chest x-ray was stable and he was transferred out to stepdown unit. Acute blood loss anemia treated with oral iron replacement. Aspirin dosing decreased to 81 mg due to post-operative thrombocytopenia. Later in the evening on 7/03 he was noted to have right hand weakness and rapid response was called. He was noted to have atrial fibrillation with RVR and was hypotensive. He was started on an amiodarone infusion and cardiology consulted. He was transition to amiodarone 200 mg twice daily on 7/04. CT of head without evidence of acute infarct or hemorrhage, however brain MRI showed scattered small acute infarcts in the left cerebellar hemisphere in both cerebral hemispheres likely embolic in etiology. Neurology was consulted. He was felt to be a candidate for Coumadin due to the recent new aortic valve as well as postoperative A-fib. No large vessel occlusion noted. INR goal is 2.0. BLE venous duplex negative for DVT on 7/06. Hemoglobin is stable and platelet count is now normal.  HPI  Right arm doing about the same as prior visit, states OT wanted to continue working with him Pt can tie his shoes, can unload dishes but R hand does feel as steady Has chronic pain in low back , partial discectomy in 1988 with Right foot drop.  Has AFO which he does not like to wear  Cannot do fix up jobs, Mod I all self care, still drives without issues  Lives in [place],  helps his wife at home , wife just broke her humerus   Pain Inventory Average Pain 0 Pain Right Now 0 My pain is  no pain  In the last 24 hours, has pain interfered with the following? General activity 0 Relation with others 0 Enjoyment of life 0 What TIME of day is your pain at its worst? No pain Sleep (in general) Good  Pain is worse with: no pain Pain improves with: no pain Relief from Meds: no pain  Family History  Problem Relation Age of Onset   Renal Disease Daughter    Hypertension Daughter    Social History   Socioeconomic History   Marital status: Married    Spouse name: Not on file   Number of children: 1   Years of education: 14   Highest education level: Not on file  Occupational History   Occupation: Retired   Tobacco Use   Smoking status: Former    Current packs/day: 0.00    Types: Cigarettes    Quit date: 11/18/1979    Years since quitting: 44.1   Smokeless tobacco: Never  Vaping Use   Vaping status: Never Used  Substance and Sexual Activity   Alcohol use: No    Alcohol/week: 0.0 standard drinks of alcohol   Drug use: Yes    Frequency: 7.0 times per week    Types: Marijuana    Comment: every day   Sexual activity: Not on file  Other Topics Concern   Not on file  Social History Narrative   Lives w/ wife   Caffeine use: 1 cup coffee every morning   Right handed   Social Drivers of Health   Financial Resource Strain: Not on file  Food Insecurity: No Food Insecurity (05/18/2023)   Hunger Vital Sign    Worried About Running Out of Food in the Last Year: Never true    Ran Out of Food in the Last Year: Never true  Transportation Needs: No Transportation Needs (05/18/2023)   PRAPARE - Administrator, Civil Service (Medical): No    Lack of Transportation (Non-Medical): No  Physical Activity: Not on file  Stress: Not on file  Social Connections: Not on file   Past Surgical History:  Procedure Laterality Date   AORTIC VALVE  REPLACEMENT N/A 05/18/2023   Procedure: AORTIC VALVE REPLACEMENT (AVR) USING INSPIRIS RESILIA 23 MM AORTIC VALVE;  Surgeon: Alleen Borne, MD;  [facility];  Service: Open Heart Surgery;  Laterality: N/A;   BACK SURGERY     1989   CERVICAL DISC SURGERY  04/29/2021   COLONOSCOPY N/A 12/11/2020   Procedure: COLONOSCOPY;  Surgeon: Franky Macho, MD;  [facility];  Service: Gastroenterology;  Laterality: N/A;   LEFT HEART CATHETERIZATION WITH CORONARY ANGIOGRAM N/A 03/17/2014   Procedure: LEFT HEART CATHETERIZATION WITH CORONARY ANGIOGRAM;  Surgeon: Corky Crafts, MD;  Location: [facility] CATH LAB;  Service: Cardiovascular;  Laterality: N/A;   POLYPECTOMY  12/11/2020   Procedure: POLYPECTOMY;  Surgeon: Franky Macho, MD;  [facility];  Service: Gastroenterology;;   REPLACEMENT ASCENDING AORTA N/A 05/18/2023   Procedure: REPLACEMENT OF ASCENDING AORTIC ANEURYSM USING A 30 X 10 MM HEMASHIELD PLATINUM GRAFT;  Surgeon: Alleen Borne, MD;  Location: [facility] OR;  Service: Open Heart Surgery;  Laterality: N/A;  CIRC ARREST   RIGHT HEART CATH AND CORONARY ANGIOGRAPHY N/A 04/03/2023   Procedure: RIGHT HEART CATH AND CORONARY ANGIOGRAPHY;  Surgeon: Tonny Bollman, MD;  Location: [facility] INVASIVE CV LAB;  Service: Cardiovascular;  Laterality: N/A;   ROBOT ASSISTED LAPAROSCOPIC NEPHRECTOMY  01/14/2012   Procedure: ROBOTIC ASSISTED LAPAROSCOPIC NEPHRECTOMY;  Surgeon: Milford Cage, MD;  Location: [facility] ORS;  Service: Urology;  Laterality: Right;  Robot Laparoscopic Right Partial Nephrectomy     TEE WITHOUT CARDIOVERSION N/A 05/18/2023   Procedure: TRANSESOPHAGEAL ECHOCARDIOGRAM;  Surgeon: Alleen Borne, MD;  Location: [facility] OR;  Service: Open Heart Surgery;  Laterality: N/A;   TONSILLECTOMY     Past Surgical History:  Procedure Laterality Date   AORTIC VALVE REPLACEMENT N/A 05/18/2023   Procedure: AORTIC VALVE REPLACEMENT (AVR) USING INSPIRIS RESILIA 23 MM AORTIC VALVE;  Surgeon: Alleen Borne, MD;  [facility] OR;  Service: Open Heart Surgery;  Laterality: N/A;   BACK SURGERY     1989   CERVICAL DISC SURGERY  04/29/2021   COLONOSCOPY N/A 12/11/2020   Procedure: COLONOSCOPY;  Surgeon: Franky Macho, MD;  [facility];  Service: Gastroenterology;  Laterality: N/A;   LEFT HEART CATHETERIZATION WITH CORONARY ANGIOGRAM N/A 03/17/2014   Procedure: LEFT HEART CATHETERIZATION WITH CORONARY ANGIOGRAM;  Surgeon: Corky Crafts, MD;  Location: [facility] CATH LAB;  Service: Cardiovascular;  Laterality: N/A;   POLYPECTOMY  12/11/2020   Procedure: POLYPECTOMY;  Surgeon: Franky Macho, MD;  [facility];  Service: Gastroenterology;;   REPLACEMENT ASCENDING AORTA N/A 05/18/2023   Procedure: REPLACEMENT OF ASCENDING AORTIC ANEURYSM USING A 30 X 10 MM HEMASHIELD PLATINUM GRAFT;  Surgeon: Alleen Borne,  MD;  [facility]: [facility];  Service: Open Heart Surgery;  Laterality: N/A;  CIRC ARREST   RIGHT HEART CATH AND CORONARY ANGIOGRAPHY N/A 04/03/2023   Procedure: RIGHT HEART CATH AND CORONARY ANGIOGRAPHY;  Surgeon: Tonny Bollman, MD;  Location: [facility] INVASIVE CV LAB;  Service: Cardiovascular;  Laterality: N/A;   ROBOT ASSISTED LAPAROSCOPIC NEPHRECTOMY  01/14/2012   Procedure: ROBOTIC ASSISTED LAPAROSCOPIC NEPHRECTOMY;  Surgeon: Milford Cage, MD;  Location: [facility]S;  Service: Urology;  Laterality: Right;  Robot Laparoscopic Right Partial Nephrectomy     TEE WITHOUT CARDIOVERSION N/A 05/18/2023   Procedure: TRANSESOPHAGEAL ECHOCARDIOGRAM;  Surgeon: Alleen Borne, MD;  Location: [facility] OR;  Service: Open Heart Surgery;  Laterality: N/A;   TONSILLECTOMY     Past Medical History:  Diagnosis Date   Arthritis    Coronary atherosclerosis of native coronary artery    a. NSTEMI (02/2014):  LHC (02/2014):  Mild disease in LAD and CFX; prox RCA occluded with R-R collats, dist RCA filled by L-R collats, inf HK, EF 55%, LVEDP 15 mmHg.  PCI:  Unsuccessful angioplasty of RCA (late  presentation of inf MI) - tx medically.   Essential hypertension    GERD (gastroesophageal reflux disease)    Hyperlipidemia    NSTEMI (non-ST elevated myocardial infarction) (HCC) 03/11/14   Renal cell carcinoma of right kidney (HCC)    Partial nephrectomy in 2013   BP (!) 145/89   Pulse 63   Ht 5\' 8"  (1.727 m)   Wt 165 lb 12.8 oz (75.2 kg)   SpO2 98%   BMI 25.21 kg/m   Opioid Risk Score:   Fall Risk Score:  `1  Depression screen [facility] 2/9     12/15/2023   11:57 AM 09/15/2023   11:45 AM 07/03/2023    1:44 PM  Depression screen PHQ 2/9  Decreased Interest 2 0 0  Down, Depressed, Hopeless 2 0 0  PHQ - 2 Score 4 0 0  Altered sleeping   1  Tired, decreased energy   1  Change in appetite   1  Feeling bad or failure about yourself    0  Trouble concentrating   0  Moving slowly or fidgety/restless   0  Suicidal thoughts   0  PHQ-9 Score   3     Review of Systems  All other systems reviewed and are negative.      Objective:   Physical Exam  General no acute distress Mood affect appropriate Right upper extremity 3 - at the deltoid bicep tricep to minus finger extensors 3 - finger flexors and thumb flexor Tone MAS 1 at the elbow flexor wrist flexor and finger flexors on the right side Right lower extremity strength 4 - at the hip flexor knee extensor trace ankle dorsiflexor      Assessment & Plan:   1.  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 We discussed OT reevaluation.  He may be a candidate for vivistim, vagal nerve stimulation system 2.  Right foot drop with history of lumbar stenosis has been evaluated by neurology.  Continue AFO use, follow-up with neurology

## 2023-12-15 NOTE — Patient Instructions (Signed)
Vivistim-  Vagal nerve stimulation

## 2024-01-04 ENCOUNTER — Ambulatory Visit: Payer: Medicare Other | Attending: Cardiology | Admitting: *Deleted

## 2024-01-04 DIAGNOSIS — I639 Cerebral infarction, unspecified: Secondary | ICD-10-CM

## 2024-01-04 DIAGNOSIS — Z5181 Encounter for therapeutic drug level monitoring: Secondary | ICD-10-CM

## 2024-01-04 DIAGNOSIS — Z952 Presence of prosthetic heart valve: Secondary | ICD-10-CM | POA: Diagnosis not present

## 2024-01-04 DIAGNOSIS — I480 Paroxysmal atrial fibrillation: Secondary | ICD-10-CM

## 2024-01-04 LAB — POCT INR: INR: 3 (ref 2.0–3.0)

## 2024-01-04 MED ORDER — WARFARIN SODIUM 2.5 MG PO TABS: ORAL_TABLET | ORAL | 5 refills | Status: DC

## 2024-01-04 NOTE — Patient Instructions (Signed)
 Continue warfarin 2 tablets daily except 2 1/2 on Tuesdays and Saturdays Recheck in 4 weeks

## 2024-01-18 ENCOUNTER — Telehealth: Payer: Self-pay | Admitting: *Deleted

## 2024-01-18 NOTE — Telephone Encounter (Signed)
 Received referral from [name], NP at [facility] for a Right Inguinal Hernia.   Call placed to patient to schedule consult. Patient requested appointment "this week." Advised that since one provider is out of the office, the earliest available appointment is 03/20. Patient states that he will call around to see if he could be seen sooner.

## 2024-01-19 ENCOUNTER — Other Ambulatory Visit: Payer: Self-pay | Admitting: *Deleted

## 2024-01-19 DIAGNOSIS — R0989 Other specified symptoms and signs involving the circulatory and respiratory systems: Secondary | ICD-10-CM

## 2024-01-21 ENCOUNTER — Encounter: Payer: Self-pay | Admitting: Vascular Surgery

## 2024-01-21 ENCOUNTER — Other Ambulatory Visit: Payer: Self-pay

## 2024-01-21 ENCOUNTER — Ambulatory Visit (INDEPENDENT_AMBULATORY_CARE_PROVIDER_SITE_OTHER): Admitting: Vascular Surgery

## 2024-01-21 ENCOUNTER — Other Ambulatory Visit: Payer: Self-pay | Admitting: *Deleted

## 2024-01-21 ENCOUNTER — Ambulatory Visit (HOSPITAL_COMMUNITY)
Admission: RE | Admit: 2024-01-21 | Discharge: 2024-01-21 | Disposition: A | Discharge status: Home / Self Care | Source: Ambulatory Visit | Attending: Vascular Surgery | Admitting: Vascular Surgery

## 2024-01-21 VITALS — BP 146/90 | HR 60 | Temp 98.4°F | Resp 20 | Ht 68.0 in | Wt 164.0 lb

## 2024-01-21 DIAGNOSIS — K4090 Unilateral inguinal hernia, without obstruction or gangrene, not specified as recurrent: Secondary | ICD-10-CM

## 2024-01-21 DIAGNOSIS — R0989 Other specified symptoms and signs involving the circulatory and respiratory systems: Secondary | ICD-10-CM | POA: Insufficient documentation

## 2024-01-21 DIAGNOSIS — I70235 Atherosclerosis of native arteries of right leg with ulceration of other part of foot: Secondary | ICD-10-CM | POA: Diagnosis not present

## 2024-01-21 DIAGNOSIS — I70245 Atherosclerosis of native arteries of left leg with ulceration of other part of foot: Secondary | ICD-10-CM

## 2024-01-21 LAB — VAS US ABI WITH/WO TBI
Left ABI: 1.04
Right ABI: 1.07

## 2024-01-21 MED ORDER — NIFEDIPINE ER OSMOTIC RELEASE 30 MG PO TB24: 30.0000 mg | ORAL_TABLET | Freq: Every day | ORAL | 3 refills | Status: DC

## 2024-01-21 NOTE — Progress Notes (Signed)
 Office Note     CC: Lower extremity wounds with concern for peripheral arterial disease Requesting Provider:  [name], PA-C  HPI: [name] is a 68 y.o. (06/15/1956) male presenting at the request of .[name], PA-C with lower extremity wounds and concern for peripheral arterial disease.  On exam, [name] was doing well. He and his wife currently reside in [place].  Prior to [name], [name] lived in a number of locations while in the [employer].  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  [name]'s stroke happened in July of last year status post Bent[place] procedure.  He has done very well since that time, and has had significant improvement with rehab.    Since 2001, [name] has had issues with lower extremity weakness, right greater than left.  This sidelined him from doing the things that he left such as golf.  Over the last several months, [name] is noted progression in blue discoloration in the right leg.  There is some discoloration in the left as well.  Accompanying the discoloration are wounds at the toes bilaterally.  [name] states that he is very sensitive to cold, and notes significant color changes in his feet with accompanying pain.  He states the symptoms are not nearly as prevalent in the summertime.  He denies history of autoimmune disease. Former smoker    The pt is  on a statin for cholesterol management.  The pt is  on a daily aspirin.   Other AC:  - The pt is  on medication for hypertension.   The pt is not diabetic.  Tobacco hx:  former  Past Medical History:  Diagnosis Date   Arthritis    Coronary atherosclerosis of native coronary artery    a. NSTEMI (02/2014):  LHC (02/2014):  Mild disease in LAD and CFX; prox RCA occluded with R-R collats, dist RCA filled by L-R collats, inf HK, EF 55%, LVEDP 15 mmHg.  PCI:  Unsuccessful angioplasty of RCA (late presentation of  inf MI) - tx medically.   Essential hypertension    GERD (gastroesophageal reflux disease)    Hyperlipidemia    NSTEMI (non-ST elevated myocardial infarction) (HCC) 03/11/14   Renal cell carcinoma of right kidney (HCC)    Partial nephrectomy in 2013    Past Surgical History:  Procedure Laterality Date   AORTIC VALVE REPLACEMENT N/A 05/18/2023   Procedure: AORTIC VALVE REPLACEMENT (AVR) USING INSPIRIS RESILIA 23 MM AORTIC VALVE;  Surgeon: Alleen Borne, MD;  [facility];  Service: Open Heart Surgery;  Laterality: N/A;   BACK SURGERY     1989   CERVICAL DISC SURGERY  04/29/2021   COLONOSCOPY N/A 12/11/2020   Procedure: COLONOSCOPY;  Surgeon: Franky Macho, MD;  [facility];  Service: Gastroenterology;  Laterality: N/A;   LEFT HEART CATHETERIZATION WITH CORONARY ANGIOGRAM N/A 03/17/2014   Procedure: LEFT HEART CATHETERIZATION WITH CORONARY ANGIOGRAM;  Surgeon: Corky Crafts, MD;  Location: [facility] CATH LAB;  Service: Cardiovascular;  Laterality: N/A;   POLYPECTOMY  12/11/2020   Procedure: POLYPECTOMY;  Surgeon: Franky Macho, MD;  [facility];  Service: Gastroenterology;;   REPLACEMENT ASCENDING AORTA N/A 05/18/2023   Procedure: REPLACEMENT OF ASCENDING AORTIC ANEURYSM USING A 30 X 10 MM HEMASHIELD PLATINUM GRAFT;  Surgeon: Alleen Borne, MD;  Location: [facility] OR;  Service: Open Heart Surgery;  Laterality: N/A;  CIRC ARREST  RIGHT HEART CATH AND CORONARY ANGIOGRAPHY N/A 04/03/2023   Procedure: RIGHT HEART CATH AND CORONARY ANGIOGRAPHY;  Surgeon: Tonny Bollman, MD;  Location: [facility] INVASIVE CV LAB;  Service: Cardiovascular;  Laterality: N/A;   ROBOT ASSISTED LAPAROSCOPIC NEPHRECTOMY  01/14/2012   Procedure: ROBOTIC ASSISTED LAPAROSCOPIC NEPHRECTOMY;  Surgeon: Milford Cage, MD;  Location: [facility] ORS;  Service: Urology;  Laterality: Right;  Robot Laparoscopic Right Partial Nephrectomy     TEE WITHOUT CARDIOVERSION N/A 05/18/2023   Procedure: TRANSESOPHAGEAL  ECHOCARDIOGRAM;  Surgeon: Alleen Borne, MD;  Location: [facility] OR;  Service: Open Heart Surgery;  Laterality: N/A;   TONSILLECTOMY      Social History   Socioeconomic History   Marital status: Married    Spouse name: Not on file   Number of children: 1   Years of education: 14   Highest education level: Not on file  Occupational History   Occupation: Retired   Tobacco Use   Smoking status: Former    Current packs/day: 0.00    Types: Cigarettes    Quit date: 11/18/1979    Years since quitting: 44.2   Smokeless tobacco: Never  Vaping Use   Vaping status: Never Used  Substance and Sexual Activity   Alcohol use: No    Alcohol/week: 0.0 standard drinks of alcohol   Drug use: Yes    Frequency: 7.0 times per week    Types: Marijuana    Comment: every day   Sexual activity: Not on file  Other Topics Concern   Not on file  Social History Narrative   Lives w/ wife   Caffeine use: 1 cup coffee every morning   Right handed   Social Drivers of Health   Financial Resource Strain: Not on file  Food Insecurity: No Food Insecurity (05/18/2023)   Hunger Vital Sign    Worried About Running Out of Food in the Last Year: Never true    Ran Out of Food in the Last Year: Never true  Transportation Needs: No Transportation Needs (05/18/2023)   PRAPARE - Administrator, Civil Service (Medical): No    Lack of Transportation (Non-Medical): No  Physical Activity: Not on file  Stress: Not on file  Social Connections: Not on file  Intimate Partner Violence: Not At Risk (05/18/2023)   Humiliation, Afraid, Rape, and Kick questionnaire    Fear of Current or Ex-Partner: No    Emotionally Abused: No    Physically Abused: No    Sexually Abused: No   Family History  Problem Relation Age of Onset   Renal Disease Daughter    Hypertension Daughter     Current Outpatient Medications  Medication Sig Dispense Refill   acetaminophen (TYLENOL) 325 MG tablet Take 1-2 tablets (325-650 mg total)  by mouth every 4 (four) hours as needed for mild pain.     amoxicillin (AMOXIL) 500 MG capsule Take 4 capsules (2,000 mg total) by mouth as directed. 30-60 minutes before dental procedures 4 capsule 2   aspirin EC 81 MG EC tablet Take 1 tablet (81 mg total) by mouth daily.     atorvastatin (LIPITOR) 80 MG tablet Take 1 tablet (80 mg total) by mouth daily. 90 tablet 3   baclofen (LIORESAL) 10 MG tablet One or two po tid up to 6/day     finasteride (PROSCAR) 5 MG tablet Take 1 tablet (5 mg total) by mouth daily. 30 tablet 0   metoprolol tartrate (LOPRESSOR) 25 MG tablet Take 1 tablet (25 mg total)  by mouth 2 (two) times daily. 180 tablet 1   warfarin (COUMADIN) 2.5 MG tablet Take 2 to 2 1/2 tablets daily or as directed by coumadin clinic 70 tablet 5   No current facility-administered medications for this visit.    Allergies  Allergen Reactions   Alcohol Other (See Comments)    Pts skin gets red. Ex. Pt used alcohol based deodorant and got red things under arms. & Drinks alcohol notices "from heart on up" everything turns red.   Chlorthalidone     Dizziness and Syncopal Episode     REVIEW OF SYSTEMS:  [X]  denotes positive finding, [ ]  denotes negative finding Cardiac  Comments:  Chest pain or chest pressure:    Shortness of breath upon exertion:    Short of breath when lying flat:    Irregular heart rhythm:        Vascular    Pain in calf, thigh, or hip brought on by ambulation:    Pain in feet at night that wakes you up from your sleep:     Blood clot in your veins:    Leg swelling:         Pulmonary    Oxygen at home:    Productive cough:     Wheezing:         Neurologic    Sudden weakness in arms or legs:     Sudden numbness in arms or legs:     Sudden onset of difficulty speaking or slurred speech:    Temporary loss of vision in one eye:     Problems with dizziness:         Gastrointestinal    Blood in stool:     Vomited blood:         Genitourinary    Burning  when urinating:     Blood in urine:        Psychiatric    Major depression:         Hematologic    Bleeding problems:    Problems with blood clotting too easily:        Skin    Rashes or ulcers:        Constitutional    Fever or chills:      PHYSICAL EXAMINATION:  Vitals:   01/21/24 1137  BP: (!) 146/90  Pulse: 60  Resp: 20  Temp: 98.4 F (36.9 C)  SpO2: 96%  Weight: 164 lb (74.4 kg)  Height: 5\' 8"  (1.727 m)    General:  WDWN in NAD; vital signs documented above Gait: Not observed HENT: WNL, normocephalic Pulmonary: normal non-labored breathing , without wheezing Cardiac: regular HR Abdomen: soft, NT, no masses Skin: without rashes Vascular Exam/Pulses:  Right Left  Radial 2+ (normal) 2+ (normal)  Ulnar    Femoral    Popliteal    DP absent absent  PT 2+ (normal) 2+ (normal)   Extremities: with ischemic changes, without Gangrene , without cellulitis; with open wounds;       Musculoskeletal: no muscle wasting or atrophy  Neurologic: A&O X 3;  No focal weakness or paresthesias are detected Psychiatric:  The pt has Normal affect.   Non-Invasive Vascular Imaging:    Summary:  Right: Resting right ankle-brachial index is within normal range.     Flatline PPG waveforms of the 1st-3rd digits with severely dampended  4th-5th digits.   Left: Resting left ankle-brachial index is within normal range.   Flatline PPG waveforms of the 1st-3rd digits with  severely dampended  4th-5th digits.        ASSESSMENT/PLAN: [name] is a 68 y.o. male presenting with chronic wounds present at the toes bilaterally.  The right foot appears to be worse than the left.  On physical exam, complex palpable posterior tibial pulse, nonpalpable dorsalis pedis pulses bilaterally. ABI was reviewed demonstrating relatively normal perfusion in the posterior tibial artery, poor perfusion in the anterior tibial artery, and no toe pressure bilaterally.  With symptoms  described, I think [name] likely has Raynaud's disease that is considered severe due to the ulcerations.  I had a long discussion with him regarding the above.  The treatment of Raynaud's disease is by reducing exposure to cold, and eliminating ergot's such as caffeine to prevent vasospasm.  [name] has decreased his caffeine intake dramatically, and we will work to decrease his exposure to cold.  Due to wounds being present, we also discussed initiating nifedipine at 30 mg daily to hopefully minimize vasospasm.  Furthermore, [name] and I talked about bilateral lower extremity angiogram.  There is no question that he has macrovascular arterial disease as demonstrated by poor waveforms in the anterior tibial artery.  While I think the study will likely be diagnostic as he has a palpable posterior tibial pulse, I think that angiography is necessary to ensure he has inline flow without stenosis as he has nonhealing wounds bilaterally.  My plan is to perform bilateral lower extremity diagnostic angiography on Wednesday with possible intervention on the right lower extremity.  I think that intervention would likely be tibial, and that's severe small vessel disease will be identified.  Should only small vessel disease be present, the only treatment would be medical management.  After discussing the risks and benefits the above, [name] elected to proceed.   Curtis Sparrow, MD [facility] [phone]

## 2024-01-26 ENCOUNTER — Telehealth: Payer: Self-pay | Admitting: Cardiology

## 2024-01-26 ENCOUNTER — Ambulatory Visit (INDEPENDENT_AMBULATORY_CARE_PROVIDER_SITE_OTHER): Admitting: General Surgery

## 2024-01-26 ENCOUNTER — Encounter: Payer: Self-pay | Admitting: General Surgery

## 2024-01-26 VITALS — BP 104/72 | HR 66 | Temp 97.6°F | Resp 14 | Ht 68.0 in | Wt 165.0 lb

## 2024-01-26 DIAGNOSIS — K4090 Unilateral inguinal hernia, without obstruction or gangrene, not specified as recurrent: Secondary | ICD-10-CM | POA: Diagnosis not present

## 2024-01-26 NOTE — Progress Notes (Unsigned)
 [facility] History and Physical  Reason for Referral:*** Referring Physician: ***  Chief Complaint   New Patient (Initial Visit)     [name] is a 68 y.o. male.  HPI: ***.  The *** started *** and has had a duration of ***.  It is associated with ***.  The *** is improved with ***, and is made worse with ***.    Quality*** Context***  Past Medical History:  Diagnosis Date   Arthritis    Coronary atherosclerosis of native coronary artery    a. NSTEMI (02/2014):  LHC (02/2014):  Mild disease in LAD and CFX; prox RCA occluded with R-R collats, dist RCA filled by L-R collats, inf HK, EF 55%, LVEDP 15 mmHg.  PCI:  Unsuccessful angioplasty of RCA (late presentation of inf MI) - tx medically.   Essential hypertension    GERD (gastroesophageal reflux disease)    Hyperlipidemia    NSTEMI (non-ST elevated myocardial infarction) (HCC) 03/11/14   Renal cell carcinoma of right kidney (HCC)    Partial nephrectomy in 2013    Past Surgical History:  Procedure Laterality Date   AORTIC VALVE REPLACEMENT N/A 05/18/2023   Procedure: AORTIC VALVE REPLACEMENT (AVR) USING INSPIRIS RESILIA 23 MM AORTIC VALVE;  Surgeon: Alleen Borne, MD;  Location: [facility] OR;  Service: Open Heart Surgery;  Laterality: N/A;   BACK SURGERY     1989   CERVICAL DISC SURGERY  04/29/2021   COLONOSCOPY N/A 12/11/2020   Procedure: COLONOSCOPY;  Surgeon: Franky Macho, MD;  [facility]: [facility];  Service: Gastroenterology;  Laterality: N/A;   LEFT HEART CATHETERIZATION WITH CORONARY ANGIOGRAM N/A 03/17/2014   Procedure: LEFT HEART CATHETERIZATION WITH CORONARY ANGIOGRAM;  Surgeon: Corky Crafts, MD;  Location: [facility] CATH LAB;  Service: Cardiovascular;  Laterality: N/A;   POLYPECTOMY  12/11/2020   Procedure: POLYPECTOMY;  Surgeon: Franky Macho, MD;  Location: [facility];  Service: Gastroenterology;;   REPLACEMENT ASCENDING AORTA N/A 05/18/2023   Procedure: REPLACEMENT OF ASCENDING AORTIC ANEURYSM  USING A 30 X 10 MM HEMASHIELD PLATINUM GRAFT;  Surgeon: Alleen Borne, MD;  Location: [facility] OR;  Service: Open Heart Surgery;  Laterality: N/A;  CIRC ARREST   RIGHT HEART CATH AND CORONARY ANGIOGRAPHY N/A 04/03/2023   Procedure: RIGHT HEART CATH AND CORONARY ANGIOGRAPHY;  Surgeon: Tonny Bollman, MD;  Location: [facility] INVASIVE CV LAB;  Service: Cardiovascular;  Laterality: N/A;   ROBOT ASSISTED LAPAROSCOPIC NEPHRECTOMY  01/14/2012   Procedure: ROBOTIC ASSISTED LAPAROSCOPIC NEPHRECTOMY;  Surgeon: Milford Cage, MD;  Location: [facility] ORS;  Service: Urology;  Laterality: Right;  Robot Laparoscopic Right Partial Nephrectomy     TEE WITHOUT CARDIOVERSION N/A 05/18/2023   Procedure: TRANSESOPHAGEAL ECHOCARDIOGRAM;  Surgeon: Alleen Borne, MD;  [facility]: [facility] OR;  Service: Open Heart Surgery;  Laterality: N/A;   TONSILLECTOMY      Family History  Problem Relation Age of Onset   Renal Disease Daughter    Hypertension Daughter     Social History   Tobacco Use   Smoking status: Former    Current packs/day: 0.00    Types: Cigarettes    Quit date: 11/18/1979    Years since quitting: 44.2   Smokeless tobacco: Never  Vaping Use   Vaping status: Never Used  Substance Use Topics   Alcohol use: No    Alcohol/week: 0.0 standard drinks of alcohol   Drug use: Yes    Frequency: 7.0 times per week    Types: Marijuana  Comment: every day    Medications: {medication reviewed/display:3041432} Allergies as of 01/26/2024       Reactions   Alcohol Other (See Comments)   Pts skin gets red. Ex. Pt used alcohol based deodorant and got red things under arms. & Drinks alcohol notices "from heart on up" everything turns red.   Chlorthalidone    Dizziness and Syncopal Episode        Medication List        Accurate as of January 26, 2024 10:17 AM. If you have any questions, ask your nurse or doctor.          amoxicillin 500 MG capsule Commonly known as: AMOXIL Take 4 capsules (2,000 mg total) by  mouth as directed. 30-60 minutes before dental procedures   aspirin EC 81 MG tablet Take 1 tablet (81 mg total) by mouth daily.   atorvastatin 80 MG tablet Commonly known as: LIPITOR Take 1 tablet (80 mg total) by mouth daily.   baclofen 10 MG tablet Commonly known as: LIORESAL One or two po tid up to 6/day What changed:  how much to take how to take this when to take this   finasteride 5 MG tablet Commonly known as: PROSCAR Take 1 tablet (5 mg total) by mouth daily.   metoprolol tartrate 25 MG tablet Commonly known as: LOPRESSOR Take 1 tablet (25 mg total) by mouth 2 (two) times daily. What changed: how much to take   NIFEdipine 30 MG 24 hr tablet Commonly known as: PROCARDIA-XL/NIFEDICAL-XL Take 1 tablet (30 mg total) by mouth daily.   warfarin 2.5 MG tablet Commonly known as: COUMADIN Take as directed by the anticoagulation clinic. If you are unsure how to take this medication, talk to your nurse or doctor. Original instructions: Take 2 to 2 1/2 tablets daily or as directed by coumadin clinic         ROS:  {Review of Systems:30496}  Blood pressure 104/72, pulse 66, temperature 97.6 F (36.4 C), temperature source Oral, resp. rate 14, height 5\' 8"  (1.727 m), weight 165 lb (74.8 kg), SpO2 95%. Physical Exam  Results: No results found for this or any previous visit (from the past 48 hours).  No results found.   Assessment & Plan:  [name] is a 68 y.o. male with *** -*** -*** -Follow up ***  All questions were answered to the satisfaction of the patient and family***.  The risk and benefits of *** were discussed including but not limited to ***.  After careful consideration, [name] has decided to ***.    Lucretia Roers 01/26/2024, 10:17 AM

## 2024-01-26 NOTE — Telephone Encounter (Signed)
 Pt c/o medication issue:  1. Name of Medication: NIFEdipine (PROCARDIA-XL/NIFEDICAL-XL) 30 MG 24 hr tablet   2. How are you currently taking this medication (dosage and times per day)?    3. Are you having a reaction (difficulty breathing--STAT)? no  4. What is your medication issue? Patient states that he has passed out on this medication. And want to know if he should keep taking it. He has a surgery in the morning. Please advise    Pt c/o medication issue:  1. Name of Medication: warfarin (COUMADIN) 2.5 MG tablet   2. How are you currently taking this medication (dosage and times per day)?    3. Are you having a reaction (difficulty breathing--STAT)? no  4. What is your medication issue?  And would like to know if he suppose to go back on his coumadin after his surgery on tomorrow

## 2024-01-26 NOTE — Patient Instructions (Addendum)
 Patient had a loss of consciousness and lower blood pressures after starting nifedipine last week. His blood pressure has been in the 100s systolic.  He is scheduled for a procedure with Dr. Karin Lieu tomorrow. I have notified Dr. Karin Lieu.   He came into the clinic for evaluation of a minimally symptomatic right inguinal hernia. This can be evaluated further at another date. He will need cardiology to evaluate him for risk stratification and also recommendations on his coumadin given his aortic valve and his A fib prior to any inguinal hernia surgery.

## 2024-01-27 ENCOUNTER — Other Ambulatory Visit: Payer: Self-pay

## 2024-01-27 ENCOUNTER — Encounter (HOSPITAL_COMMUNITY)
Admission: RE | Disposition: A | Discharge status: Home / Self Care | Payer: Self-pay | Source: Ambulatory Visit | Attending: Vascular Surgery

## 2024-01-27 ENCOUNTER — Ambulatory Visit (HOSPITAL_COMMUNITY)
Admission: RE | Admit: 2024-01-27 | Discharge: 2024-01-27 | Disposition: A | Discharge status: Home / Self Care | Source: Ambulatory Visit | Attending: Vascular Surgery | Admitting: Vascular Surgery

## 2024-01-27 DIAGNOSIS — I739 Peripheral vascular disease, unspecified: Secondary | ICD-10-CM | POA: Insufficient documentation

## 2024-01-27 DIAGNOSIS — E785 Hyperlipidemia, unspecified: Secondary | ICD-10-CM | POA: Insufficient documentation

## 2024-01-27 DIAGNOSIS — Z8673 Personal history of transient ischemic attack (TIA), and cerebral infarction without residual deficits: Secondary | ICD-10-CM | POA: Insufficient documentation

## 2024-01-27 DIAGNOSIS — Z7982 Long term (current) use of aspirin: Secondary | ICD-10-CM | POA: Diagnosis not present

## 2024-01-27 DIAGNOSIS — Z79899 Other long term (current) drug therapy: Secondary | ICD-10-CM | POA: Insufficient documentation

## 2024-01-27 DIAGNOSIS — I10 Essential (primary) hypertension: Secondary | ICD-10-CM | POA: Insufficient documentation

## 2024-01-27 DIAGNOSIS — Z87891 Personal history of nicotine dependence: Secondary | ICD-10-CM | POA: Insufficient documentation

## 2024-01-27 DIAGNOSIS — I70245 Atherosclerosis of native arteries of left leg with ulceration of other part of foot: Secondary | ICD-10-CM

## 2024-01-27 DIAGNOSIS — I70235 Atherosclerosis of native arteries of right leg with ulceration of other part of foot: Secondary | ICD-10-CM | POA: Diagnosis not present

## 2024-01-27 HISTORY — PX: ABDOMINAL AORTOGRAM W/LOWER EXTREMITY: CATH118223

## 2024-01-27 LAB — POCT I-STAT, CHEM 8
BUN: 26 mg/dL — ABNORMAL HIGH (ref 8–23)
Calcium, Ion: 1.18 mmol/L (ref 1.15–1.40)
Chloride: 108 mmol/L (ref 98–111)
Creatinine, Ser: 1.2 mg/dL (ref 0.61–1.24)
Glucose, Bld: 102 mg/dL — ABNORMAL HIGH (ref 70–99)
HCT: 43 % (ref 39.0–52.0)
Hemoglobin: 14.6 g/dL (ref 13.0–17.0)
Potassium: 4.5 mmol/L (ref 3.5–5.1)
Sodium: 141 mmol/L (ref 135–145)
TCO2: 26 mmol/L (ref 22–32)

## 2024-01-27 LAB — PROTIME-INR
INR: 1.2 (ref 0.8–1.2)
Prothrombin Time: 15.7 s — ABNORMAL HIGH (ref 11.4–15.2)

## 2024-01-27 SURGERY — ABDOMINAL AORTOGRAM W/LOWER EXTREMITY
Anesthesia: LOCAL | Laterality: Bilateral

## 2024-01-27 MED ORDER — HYDRALAZINE HCL 20 MG/ML IJ SOLN
INTRAMUSCULAR | Status: DC | PRN
  Administered 2024-01-27: 10 mg via INTRAVENOUS

## 2024-01-27 MED ORDER — IODIXANOL 320 MG/ML IV SOLN
INTRAVENOUS | Status: DC | PRN
  Administered 2024-01-27: 65 mL

## 2024-01-27 MED ORDER — HYDRALAZINE HCL 20 MG/ML IJ SOLN: 5.0000 mg | INTRAMUSCULAR | Status: DC | PRN

## 2024-01-27 MED ORDER — FENTANYL CITRATE (PF) 100 MCG/2ML IJ SOLN
INTRAMUSCULAR | Status: AC
  Filled 2024-01-27: qty 2

## 2024-01-27 MED ORDER — LIDOCAINE HCL (PF) 1 % IJ SOLN
INTRAMUSCULAR | Status: AC
  Filled 2024-01-27: qty 30

## 2024-01-27 MED ORDER — SODIUM CHLORIDE 0.9% FLUSH: 3.0000 mL | INTRAVENOUS | Status: DC | PRN

## 2024-01-27 MED ORDER — HEPARIN (PORCINE) IN NACL 1000-0.9 UT/500ML-% IV SOLN
INTRAVENOUS | Status: DC | PRN
  Administered 2024-01-27 (×2): 500 mL

## 2024-01-27 MED ORDER — ASPIRIN 81 MG PO TBEC: 81.0000 mg | DELAYED_RELEASE_TABLET | Freq: Every day | ORAL | Status: DC

## 2024-01-27 MED ORDER — MIDAZOLAM HCL 2 MG/2ML IJ SOLN
INTRAMUSCULAR | Status: DC | PRN
  Administered 2024-01-27: 1 mg via INTRAVENOUS

## 2024-01-27 MED ORDER — SODIUM CHLORIDE 0.9 % WEIGHT BASED INFUSION: 1.0000 mL/kg/h | INTRAVENOUS | Status: DC

## 2024-01-27 MED ORDER — LIDOCAINE HCL (PF) 1 % IJ SOLN
INTRAMUSCULAR | Status: DC | PRN
  Administered 2024-01-27: 15 mL via INTRADERMAL

## 2024-01-27 MED ORDER — ONDANSETRON HCL 4 MG/2ML IJ SOLN: 4.0000 mg | Freq: Four times a day (QID) | INTRAMUSCULAR | Status: DC | PRN

## 2024-01-27 MED ORDER — LABETALOL HCL 5 MG/ML IV SOLN: 10.0000 mg | INTRAVENOUS | Status: DC | PRN

## 2024-01-27 MED ORDER — FENTANYL CITRATE (PF) 100 MCG/2ML IJ SOLN
INTRAMUSCULAR | Status: DC | PRN
  Administered 2024-01-27: 50 ug via INTRAVENOUS

## 2024-01-27 MED ORDER — ACETAMINOPHEN 325 MG PO TABS: 650.0000 mg | ORAL_TABLET | ORAL | Status: DC | PRN

## 2024-01-27 MED ORDER — MIDAZOLAM HCL 2 MG/2ML IJ SOLN
INTRAMUSCULAR | Status: AC
  Filled 2024-01-27: qty 2

## 2024-01-27 MED ORDER — SODIUM CHLORIDE 0.9% FLUSH: 3.0000 mL | Freq: Two times a day (BID) | INTRAVENOUS | Status: DC

## 2024-01-27 MED ORDER — SODIUM CHLORIDE 0.9 % IV SOLN: INTRAVENOUS | Status: DC

## 2024-01-27 MED ORDER — SODIUM CHLORIDE 0.9 % IV SOLN: 250.0000 mL | INTRAVENOUS | Status: DC | PRN

## 2024-01-27 MED ORDER — HYDRALAZINE HCL 20 MG/ML IJ SOLN
INTRAMUSCULAR | Status: AC
  Filled 2024-01-27: qty 1

## 2024-01-27 SURGICAL SUPPLY — 11 items
CATH OMNI FLUSH 5F 65CM (CATHETERS) ×1 IMPLANT
DEVICE CLOSURE MYNXGRIP 5F (Vascular Products) ×1 IMPLANT
GLIDEWIRE ADV .035X260CM (WIRE) ×1 IMPLANT
KIT MICROPUNCTURE NIT STIFF (SHEATH) ×1 IMPLANT
KIT PV (KITS) ×2 IMPLANT
KIT SINGLE USE MANIFOLD (KITS) ×1 IMPLANT
SET ATX-X65L (MISCELLANEOUS) ×1 IMPLANT
SHEATH PINNACLE 5F 10CM (SHEATH) ×1 IMPLANT
SHEATH PROBE COVER 6X72 (BAG) ×1 IMPLANT
TRAY PV CATH (CUSTOM PROCEDURE TRAY) ×2 IMPLANT
WIRE BENTSON .035X145CM (WIRE) ×1 IMPLANT

## 2024-01-27 NOTE — Op Note (Addendum)
 Patient name: [name] MRN: [id number] DOB: 1956/05/06 Sex: male  01/27/2024 Pre-operative Diagnosis: Bilateral lower extremity critical and ischemic tissue loss of the toes Post-operative diagnosis:  Same Surgeon:  Curtis Sparrow, [name] Procedure Performed: 1.  Ultrasound-guided micropuncture access of the left common femoral artery in retrograde fashion 2.  Aortogram 3.  Second-order cannulation, right lower extremity angiogram 4.  Third order cannulation, right lower extremity angiogram 5.  Left lower extremity angiogram 6.  28 minutes moderate sedation, 65 mL contrast   Indications: Patient is a 68 year old male with bilateral lower extremity wounds to the toes.  Toes are blue.  ABI demonstrated triphasic posterior tibial artery signals bilaterally, but severely depressed toe pressure.  After discussing risks and benefits of bilateral lower extremity angiogram in an effort to define and improve distal perfusion, [name] elected to proceed.  Findings:   There is sluggish flow throughout the vascular system, most appreciated in the lower extremities  Aortogram: Bilateral renal arteries patent.  No flow-limiting stenosis in the aortoiliac segment bilaterally.  On the right: No flow-limiting stenosis identified in the right leg.  Area of 38 percent stenosis at the distal tibioperoneal trunk. This is considered mild, not flow-limiting and therefore not treated.  Three-vessel runoff to the level of the ankle.  The anterior tibial artery is atretic.  The dorsalis pedis does not feel.  The foot fills from the posterior tibial artery filling the plantar arteries.   On the left: No flow-limiting stenosis identified in the left leg.  Three-vessel runoff to the level of the ankle.  The anterior tibial is atretic distally. The dorsalis pedis does not fill. The foot fills through the posterior tibial artery filling the plantar arteries.    Procedure:  The patient was identified in the  holding area and taken to room 8.  The patient was then placed supine on the table and prepped and draped in the usual sterile fashion.  A time out was called.  Ultrasound was used to evaluate the left common femoral artery.  It was patent .  A digital ultrasound image was acquired.  A micropuncture needle was used to access the left common femoral artery under ultrasound guidance.  An 018 wire was advanced without resistance and a micropuncture sheath was placed.  The 018 wire was removed and a benson wire was placed.  The micropuncture sheath was exchanged for a 5 french sheath.  An omniflush catheter was advanced over the wire to the level of L-1.  An abdominal angiogram was obtained.  Next, using the omniflush catheter and a benson wire, the aortic bifurcation was crossed and the catheter was placed into theright external iliac artery and right runoff was obtained.  Due to sluggish flow, the Omni Flush catheter was advanced into the superficial femoral artery for right lower extremity angiogram..  left runoff was performed via retrograde sheath injections.  No intervention performed.   Impression: Sluggish flow in bilateral lower extremities.  Etiologies include heart failure, small vessel disease in the foot, vasospasm. [name] would benefit from new echo to quantify his ejection fraction.  He would also benefit from a vasodilator.  He was started on nifedipine last week, but had a syncopal episode yesterday.  Per [name], the syncopal episode occurred as he was discussing hernia surgery, which made him feel queasy.  He has had no other issues since starting the medication last Friday.  I plan to send this to Dr. Diona [name] and Curtis Dory, NP, whom he  sees for cardiology and his Primary Curtis Moris NP.  I think [name] would benefit from medication to help with lower extremity vasospasm as long as his Echo is okay. Due to blood pressure concerns, will defer further management to either his primary or  cardiology.  First-line therapy is reducing exposure to cold, and eliminating ergot's such as caffeine which can cause vasospasm. From a medication standpoint, nifedipine, which is a calcium channel blocker, which can increase vasodilation in the fingers and toes. This can be prescribed in an extended release tablet.  I prescribed nifedipine, which he stated helped quite a bit.  He is currently off of this after the syncopal episode.  Can try a lower dose.   Should nifedipine not work, amlodipine has also proven to have some efficacy. Phosphodiesterase 5 inhibitors are second line therapy - Sildenafil, tadalafil, or vardenafil.  Should wounds worsen, the only therapy I could offer would be amputation.  I would defer this to podiatry at [name].   [name] [facility] [place] Office: [phone]

## 2024-01-27 NOTE — Progress Notes (Signed)
 Patient and wife was given discharge instructions. Both verbalized understanding.

## 2024-01-28 ENCOUNTER — Encounter (HOSPITAL_COMMUNITY): Payer: Self-pay | Admitting: Vascular Surgery

## 2024-01-28 NOTE — H&P (Signed)
 Office Note   Patient seen and examined in preop holding.  No complaints. No changes to medication history or physical exam since last seen in clinic. After discussing the risks and benefits of BLE angiogram, [name] elected to proceed.   Victorino Sparrow MD   CC: Lower extremity wounds with concern for peripheral arterial disease Requesting Provider:  No ref. provider found  HPI: [name] is a 68 y.o. (Feb 14, 1956) male presenting at the request of .[name], PA-C with lower extremity wounds and concern for peripheral arterial disease.  On exam, [name] was doing well. He and his wife currently reside in [place].  Prior to [place], [name] lived in a number of locations while in the [employer].  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  [name] stroke happened in July of last year status post [place] procedure.  He has done very well since that time, and has had significant improvement with rehab.    Since 2001, [name] has had issues with lower extremity weakness, right greater than left.  This sidelined him from doing the things that he left such as golf.  Over the last several months, [name] is noted progression in blue discoloration in the right leg.  There is some discoloration in the left as well.  Accompanying the discoloration are wounds at the toes bilaterally.  [name] states that he is very sensitive to cold, and notes significant color changes in his feet with accompanying pain.  He states the symptoms are not nearly as prevalent in the summertime.  He denies history of autoimmune disease. Former smoker    The pt is  on a statin for cholesterol management.  The pt is  on a daily aspirin.   Other AC:  - The pt is  on medication for hypertension.   The pt is not diabetic.  Tobacco hx:  former  Past Medical History:  Diagnosis Date   Arthritis    Coronary  atherosclerosis of native coronary artery    a. NSTEMI (02/2014):  LHC (02/2014):  Mild disease in LAD and CFX; prox RCA occluded with R-R collats, dist RCA filled by L-R collats, inf HK, EF 55%, LVEDP 15 mmHg.  PCI:  Unsuccessful angioplasty of RCA (late presentation of inf MI) - tx medically.   Essential hypertension    GERD (gastroesophageal reflux disease)    Hyperlipidemia    NSTEMI (non-ST elevated myocardial infarction) (HCC) 03/11/14   Renal cell carcinoma of right kidney (HCC)    Partial nephrectomy in 2013    Past Surgical History:  Procedure Laterality Date   ABDOMINAL AORTOGRAM W/LOWER EXTREMITY Bilateral 01/27/2024   Procedure: ABDOMINAL AORTOGRAM W/LOWER EXTREMITY;  Surgeon: Victorino Sparrow, MD;  Location: [facility] INVASIVE CV LAB;  Service: Cardiovascular;  Laterality: Bilateral;   AORTIC VALVE REPLACEMENT N/A 05/18/2023   Procedure: AORTIC VALVE REPLACEMENT (AVR) USING INSPIRIS RESILIA 23 MM AORTIC VALVE;  Surgeon: Alleen Borne, MD;  Location: [facility] OR;  Service: Open Heart Surgery;  Laterality: N/A;   BACK SURGERY     1989   CERVICAL DISC SURGERY  04/29/2021   COLONOSCOPY N/A 12/11/2020   Procedure: COLONOSCOPY;  Surgeon: Franky Macho, MD;  [facility];  Service: Gastroenterology;  Laterality: N/A;   LEFT HEART CATHETERIZATION WITH CORONARY ANGIOGRAM N/A 03/17/2014   Procedure: LEFT HEART CATHETERIZATION WITH CORONARY ANGIOGRAM;  Surgeon: Corky Crafts, MD;  Location: [facility] CATH LAB;  Service: Cardiovascular;  Laterality: N/A;   POLYPECTOMY  12/11/2020   Procedure: POLYPECTOMY;  Surgeon: Franky Macho, MD;  [facility];  Service: Gastroenterology;;   REPLACEMENT ASCENDING AORTA N/A 05/18/2023   Procedure: REPLACEMENT OF ASCENDING AORTIC ANEURYSM USING A 30 X 10 MM HEMASHIELD PLATINUM GRAFT;  Surgeon: Alleen Borne, MD;  Location: [facility] OR;  Service: Open Heart Surgery;  Laterality: N/A;  CIRC ARREST   RIGHT HEART CATH AND CORONARY ANGIOGRAPHY N/A 04/03/2023    Procedure: RIGHT HEART CATH AND CORONARY ANGIOGRAPHY;  Surgeon: Tonny Bollman, MD;  Location: [facility] INVASIVE CV LAB;  Service: Cardiovascular;  Laterality: N/A;   ROBOT ASSISTED LAPAROSCOPIC NEPHRECTOMY  01/14/2012   Procedure: ROBOTIC ASSISTED LAPAROSCOPIC NEPHRECTOMY;  Surgeon: Milford Cage, MD;  Location: [facility] ORS;  Service: Urology;  Laterality: Right;  Robot Laparoscopic Right Partial Nephrectomy     TEE WITHOUT CARDIOVERSION N/A 05/18/2023   Procedure: TRANSESOPHAGEAL ECHOCARDIOGRAM;  Surgeon: Alleen Borne, MD;  Location: [facility] OR;  Service: Open Heart Surgery;  Laterality: N/A;   TONSILLECTOMY      Social History   Socioeconomic History   Marital status: Married    Spouse name: Not on file   Number of children: 1   Years of education: 14   Highest education level: Not on file  Occupational History   Occupation: Retired   Tobacco Use   Smoking status: Former    Current packs/day: 0.00    Types: Cigarettes    Quit date: 11/18/1979    Years since quitting: 44.2   Smokeless tobacco: Never  Vaping Use   Vaping status: Never Used  Substance and Sexual Activity   Alcohol use: No    Alcohol/week: 0.0 standard drinks of alcohol   Drug use: Yes    Frequency: 7.0 times per week    Types: Marijuana    Comment: every day   Sexual activity: Not on file  Other Topics Concern   Not on file  Social History Narrative   Lives w/ wife   Caffeine use: 1 cup coffee every morning   Right handed   Social Drivers of Health   Financial Resource Strain: Not on file  Food Insecurity: No Food Insecurity (05/18/2023)   Hunger Vital Sign    Worried About Running Out of Food in the Last Year: Never true    Ran Out of Food in the Last Year: Never true  Transportation Needs: No Transportation Needs (05/18/2023)   PRAPARE - Administrator, Civil Service (Medical): No    Lack of Transportation (Non-Medical): No  Physical Activity: Not on file  Stress: Not on file  Social  Connections: Not on file  Intimate Partner Violence: Not At Risk (05/18/2023)   Humiliation, Afraid, Rape, and Kick questionnaire    Fear of Current or Ex-Partner: No    Emotionally Abused: No    Physically Abused: No    Sexually Abused: No   Family History  Problem Relation Age of Onset   Renal Disease Daughter    Hypertension Daughter     No current facility-administered medications for this encounter.   Current Outpatient Medications  Medication Sig Dispense Refill   amoxicillin (AMOXIL) 500 MG capsule Take 4 capsules (2,000 mg total) by mouth as directed. 30-60 minutes before dental procedures 4 capsule 2   aspirin EC 81 MG EC tablet Take 1 tablet (81 mg total) by mouth daily.     atorvastatin (LIPITOR) 80 MG tablet  Take 1 tablet (80 mg total) by mouth daily. 90 tablet 3   baclofen (LIORESAL) 10 MG tablet One or two po tid up to 6/day (Patient taking differently: Take 10 mg by mouth 3 (three) times daily. One or two po tid up to 6/day)     finasteride (PROSCAR) 5 MG tablet Take 1 tablet (5 mg total) by mouth daily. 30 tablet 0   metoprolol tartrate (LOPRESSOR) 25 MG tablet Take 1 tablet (25 mg total) by mouth 2 (two) times daily. (Patient taking differently: Take 12.5 mg by mouth 2 (two) times daily.) 180 tablet 1   NIFEdipine (PROCARDIA-XL/NIFEDICAL-XL) 30 MG 24 hr tablet Take 1 tablet (30 mg total) by mouth daily. 30 tablet 3   warfarin (COUMADIN) 2.5 MG tablet Take 2 to 2 1/2 tablets daily or as directed by coumadin clinic 70 tablet 5    Allergies  Allergen Reactions   Alcohol Other (See Comments)    Pts skin gets red. Ex. Pt used alcohol based deodorant and got red things under arms. & Drinks alcohol notices "from heart on up" everything turns red.   Chlorthalidone     Dizziness and Syncopal Episode     REVIEW OF SYSTEMS:  [X]  denotes positive finding, [ ]  denotes negative finding Cardiac  Comments:  Chest pain or chest pressure:    Shortness of breath upon exertion:     Short of breath when lying flat:    Irregular heart rhythm:        Vascular    Pain in calf, thigh, or hip brought on by ambulation:    Pain in feet at night that wakes you up from your sleep:     Blood clot in your veins:    Leg swelling:         Pulmonary    Oxygen at home:    Productive cough:     Wheezing:         Neurologic    Sudden weakness in arms or legs:     Sudden numbness in arms or legs:     Sudden onset of difficulty speaking or slurred speech:    Temporary loss of vision in one eye:     Problems with dizziness:         Gastrointestinal    Blood in stool:     Vomited blood:         Genitourinary    Burning when urinating:     Blood in urine:        Psychiatric    Major depression:         Hematologic    Bleeding problems:    Problems with blood clotting too easily:        Skin    Rashes or ulcers:        Constitutional    Fever or chills:      PHYSICAL EXAMINATION:  Vitals:   01/27/24 1305 01/27/24 1320 01/27/24 1335 01/27/24 1405  BP: 121/76 131/85 (!) 140/82 125/88  Pulse: 67 69 67 69  Resp:      Temp:      TempSrc:      SpO2: 95% 96% 96% 96%  Weight:      Height:        General:  WDWN in NAD; vital signs documented above Gait: Not observed HENT: WNL, normocephalic Pulmonary: normal non-labored breathing , without wheezing Cardiac: regular HR Abdomen: soft, NT, no masses Skin: without rashes Vascular Exam/Pulses:  Right Left  Radial 2+ (normal) 2+ (normal)  Ulnar    Femoral    Popliteal    DP absent absent  PT 2+ (normal) 2+ (normal)   Extremities: with ischemic changes, without Gangrene , without cellulitis; with open wounds;       Musculoskeletal: no muscle wasting or atrophy  Neurologic: A&O X 3;  No focal weakness or paresthesias are detected Psychiatric:  The pt has Normal affect.   Non-Invasive Vascular Imaging:    Summary:  Right: Resting right ankle-brachial index is within normal range.     Flatline  PPG waveforms of the 1st-3rd digits with severely dampended  4th-5th digits.   Left: Resting left ankle-brachial index is within normal range.   Flatline PPG waveforms of the 1st-3rd digits with severely dampended  4th-5th digits.        ASSESSMENT/PLAN: [name] is a 68 y.o. male presenting with chronic wounds present at the toes bilaterally.  The right foot appears to be worse than the left.  On physical exam, complex palpable posterior tibial pulse, nonpalpable dorsalis pedis pulses bilaterally. ABI was reviewed demonstrating relatively normal perfusion in the posterior tibial artery, poor perfusion in the anterior tibial artery, and no toe pressure bilaterally.  With symptoms described, I think Parsa likely has Raynaud's disease that is considered severe due to the ulcerations.  I had a long discussion with him regarding the above.  The treatment of Raynaud's disease is by reducing exposure to cold, and eliminating ergot's such as caffeine to prevent vasospasm.  Emersyn has decreased his caffeine intake dramatically, and we will work to decrease his exposure to cold.  Due to wounds being present, we also discussed initiating nifedipine at 30 mg daily to hopefully minimize vasospasm.  Furthermore, Maisie Fus and I talked about bilateral lower extremity angiogram.  There is no question that he has macrovascular arterial disease as demonstrated by poor waveforms in the anterior tibial artery.  While I think the study will likely be diagnostic as he has a palpable posterior tibial pulse, I think that angiography is necessary to ensure he has inline flow without stenosis as he has nonhealing wounds bilaterally.  My plan is to perform bilateral lower extremity diagnostic angiography on Wednesday with possible intervention on the right lower extremity.  I think that intervention would likely be tibial, and that's severe small vessel disease will be identified.  Should only small vessel disease  be present, the only treatment would be medical management.  After discussing the risks and benefits the above, Arael elected to proceed.   Victorino Sparrow, MD [facility] [phone]

## 2024-02-01 ENCOUNTER — Telehealth: Payer: Self-pay | Admitting: Nurse Practitioner

## 2024-02-01 ENCOUNTER — Telehealth: Payer: Self-pay

## 2024-02-01 ENCOUNTER — Ambulatory Visit: Payer: Medicare Other

## 2024-02-01 ENCOUNTER — Ambulatory Visit: Attending: Cardiology | Admitting: *Deleted

## 2024-02-01 DIAGNOSIS — I639 Cerebral infarction, unspecified: Secondary | ICD-10-CM | POA: Insufficient documentation

## 2024-02-01 DIAGNOSIS — I480 Paroxysmal atrial fibrillation: Secondary | ICD-10-CM | POA: Insufficient documentation

## 2024-02-01 DIAGNOSIS — Z5181 Encounter for therapeutic drug level monitoring: Secondary | ICD-10-CM | POA: Diagnosis not present

## 2024-02-01 DIAGNOSIS — Z952 Presence of prosthetic heart valve: Secondary | ICD-10-CM | POA: Diagnosis not present

## 2024-02-01 LAB — POCT INR: INR: 2 (ref 2.0–3.0)

## 2024-02-01 NOTE — Telephone Encounter (Addendum)
 Advise: -pt called, LM wondering if he could take the bandage off his groin site and to report that he had tingling and numbness in his leg last night.   -returned call to pt who stated that he realizes that he must have just been taking it too easy yesterday because he walked around, messaged his leg, and its better today.  Plus, he just read his discharge summary which explained what to look out for, etc.   -pt also stating he needed Dr. Karin Lieu to figure out what to do about taking Nifedipine and his blood pressure dropping.  I discussed managing his BP should be done by his cardiologist and it will need all his medications looked at.  Advised him to periodically check his BP so he can take it to his appts.

## 2024-02-01 NOTE — Telephone Encounter (Signed)
 Pt came in office today and stated his vein doctor started him on a medication (Nephedipine, maybe?). It's supposed to help relax the veins. It is also a BP med and he stated that it is causing his BP to bottom out and he has passed out from this. The vein doctor is suggesting he be seen asap by cardiology. He is asking for a sooner appt, we currently do not have anything available so I have added him to the wait list. Also told him I would send a message to [place] in the mean time.

## 2024-02-01 NOTE — Patient Instructions (Signed)
 Continue warfarin 2 tablets daily except 2 1/2 on Tuesdays and Saturdays Recheck in 3 weeks

## 2024-02-02 ENCOUNTER — Telehealth: Payer: Self-pay | Admitting: *Deleted

## 2024-02-02 NOTE — Telephone Encounter (Signed)
 Patient states when he takes the medication at a different time than coumadin he has no episodes he states he feels he is okay to keep taking it at this time he will just be careful and watch it when he has to take his coumadin. He feels this is more like an anxiety attack is what has been going on since he may possibly have to have surgery on a hernia.  I advise patient we would check orthostatics at his next visit.  Patient informed and verbalized understanding of plan. Advised patient if he has any more issues to let us know.

## 2024-02-02 NOTE — Telephone Encounter (Signed)
 Received call from patient [phone]~ telephone.   Patient reports that he would like to proceed with surgery scheduling.   While in process of reviewing patient chart to determine next steps, patient became upset when asked to hold for a moment.   Patient states that he will find a new doctor to fix his problems and hung up.

## 2024-02-06 ENCOUNTER — Other Ambulatory Visit: Payer: Self-pay | Admitting: Nurse Practitioner

## 2024-02-08 NOTE — Telephone Encounter (Signed)
 Contacted to clarify how he takes lopressor 25 mg since profile list 2 different instructions. Reports taking metoprolol tartrate 25 mg twice daily. Medication profile updated and sent to provider as FYI.

## 2024-02-09 ENCOUNTER — Ambulatory Visit: Attending: Nurse Practitioner | Admitting: Nurse Practitioner

## 2024-02-09 ENCOUNTER — Encounter: Payer: Self-pay | Admitting: Nurse Practitioner

## 2024-02-09 ENCOUNTER — Other Ambulatory Visit: Payer: Self-pay | Admitting: Nurse Practitioner

## 2024-02-09 ENCOUNTER — Other Ambulatory Visit

## 2024-02-09 ENCOUNTER — Telehealth: Payer: Self-pay | Admitting: Nurse Practitioner

## 2024-02-09 VITALS — BP 142/88 | HR 74 | Ht 68.0 in | Wt 169.2 lb

## 2024-02-09 DIAGNOSIS — R55 Syncope and collapse: Secondary | ICD-10-CM

## 2024-02-09 DIAGNOSIS — E785 Hyperlipidemia, unspecified: Secondary | ICD-10-CM

## 2024-02-09 DIAGNOSIS — I10 Essential (primary) hypertension: Secondary | ICD-10-CM | POA: Diagnosis not present

## 2024-02-09 DIAGNOSIS — Z85528 Personal history of other malignant neoplasm of kidney: Secondary | ICD-10-CM

## 2024-02-09 DIAGNOSIS — R231 Pallor: Secondary | ICD-10-CM

## 2024-02-09 DIAGNOSIS — I7300 Raynaud's syndrome without gangrene: Secondary | ICD-10-CM | POA: Diagnosis present

## 2024-02-09 DIAGNOSIS — Z9889 Other specified postprocedural states: Secondary | ICD-10-CM

## 2024-02-09 DIAGNOSIS — Z8679 Personal history of other diseases of the circulatory system: Secondary | ICD-10-CM

## 2024-02-09 DIAGNOSIS — R42 Dizziness and giddiness: Secondary | ICD-10-CM

## 2024-02-09 DIAGNOSIS — I2510 Atherosclerotic heart disease of native coronary artery without angina pectoris: Secondary | ICD-10-CM | POA: Diagnosis not present

## 2024-02-09 DIAGNOSIS — R001 Bradycardia, unspecified: Secondary | ICD-10-CM

## 2024-02-09 DIAGNOSIS — I4891 Unspecified atrial fibrillation: Secondary | ICD-10-CM

## 2024-02-09 DIAGNOSIS — I471 Supraventricular tachycardia, unspecified: Secondary | ICD-10-CM

## 2024-02-09 DIAGNOSIS — Z952 Presence of prosthetic heart valve: Secondary | ICD-10-CM

## 2024-02-09 DIAGNOSIS — Z8673 Personal history of transient ischemic attack (TIA), and cerebral infarction without residual deficits: Secondary | ICD-10-CM

## 2024-02-09 DIAGNOSIS — R531 Weakness: Secondary | ICD-10-CM

## 2024-02-09 DIAGNOSIS — I480 Paroxysmal atrial fibrillation: Secondary | ICD-10-CM | POA: Insufficient documentation

## 2024-02-09 DIAGNOSIS — I998 Other disorder of circulatory system: Secondary | ICD-10-CM

## 2024-02-09 NOTE — Patient Instructions (Signed)
 Medication Instructions:  Your physician has recommended you make the following change in your medication:  Stop Nifedipine Continue taking all other medications as prescribed  Labwork: CBC, CMET, Iron Panel, ANA, and ESR to be completed tomorrow at [facility] or [facility]  Testing/Procedures: .Your physician has recommended that you wear a Zio monitor.   This monitor is a medical device that records the heart's electrical activity. Doctors most often use these monitors to diagnose arrhythmias. Arrhythmias are problems with the speed or rhythm of the heartbeat. The monitor is a small device applied to your chest. You can wear one while you do your normal daily activities. While wearing this monitor if you have any symptoms to push the button and record what you felt. Once you have worn this monitor for the period of time provider prescribed (for 14 days), you will return the monitor device in the postage paid box. Once it is returned they will download the data collected and provide [place] with a report which the provider will then review and we will call you with those results. Important tips:  Avoid showering during the first 24 hours of wearing the monitor. Avoid excessive sweating to help maximize wear time. Do not submerge the device, no hot tubs, and no swimming pools. Keep any lotions or oils away from the patch. After 24 hours you may shower with the patch on. Take brief showers with your back facing the shower head.  Do not remove patch once it has been placed because that will interrupt data and decrease adhesive wear time. Push the button when you have any symptoms and write down what you were feeling. Once you have completed wearing your monitor, remove and place into box which has postage paid and place in your outgoing mailbox.  If for some reason you have misplaced your box then call our office and we can provide another box and/or mail it off for you.   Follow-Up: Your physician  recommends that you schedule a follow-up appointment in: 6 weeks  Any Other Special Instructions Will Be Listed Below (If Applicable). Thank you for choosing [facility]!     If you need a refill on your cardiac medications before your next appointment, please call your pharmacy.

## 2024-02-09 NOTE — Progress Notes (Signed)
 Cardiology Office Note:   Date:  02/09/2024 ID:  [name] September 07, 1956, MRN [id number] PCP:  [name], PA-C [facility] Providers Cardiologist:  Nona Dell, MD    Referring MD: [facility]  CC: Here for follow-up  History of Present Illness:    [name] is a very delightful 68 y.o. male with a PMH of severe AS due to bicuspid aortic valve, s/p AVR and ascending aortic aneurysm repair on May 18, 2023, CAD, s/p NSTEMI in 2015, HTN, PSVT, HLD, hx of RCC, s/p partial right nephrectomy in 2013, who presents today for follow-up.    Had an NSTEMI in 2015, underwent cardiac cath that showed occlusion of RCA filled by collaterals and had unsuccessful PCI, medical management recommended.  History of renal cell carcinoma, s/p partial nephrectomy in 2013.   Intraoperative TEE with EF approximately 50%.  Later in the evening on July 3 was noted to have right hand weakness and rapid response team was called.  Noted to have A-fib with RVR was hypotensive.  Was started on amiodarone infusion, cardiology was consulted.  Transition to amiodarone 200 mg twice daily.  CT of the head without evidence of acute infarct or hemorrhage, however MRI of the brain showed scattered small acute infarcts in left cerebellar hemisphere in both cerebral hemispheres that were likely embolic in etiology.  Neurology was consulted and was felt to be candidate for Coumadin due to new aortic valve as well as postoperative A-fib.  No DVT was noted.  06/19/2023 - Today he presents for post AVR follow-up.  He states that he is doing well since leaving the hospital. Participating in home health therapy. Does have a deficit, particularly right upper extremity weakness since the stroke, states right leg weakness has been occurring for 2 years since before his stroke.  Has been having some issues with constipation and appetite.  Weight has been stable recently. Denies any chest pain, shortness of  breath, palpitations, syncope, presyncope, dizziness, orthopnea, PND, swelling or significant weight changes, acute bleeding, or claudication.  09/07/2023 - Doing well.  He is interested in consulting a neurosurgeon based on his recent symptoms. Continuing to work with therapy, still experiencing right sided weakness, but right side grip is improving per his report. Denies any chest pain, shortness of breath, palpitations, syncope, presyncope, dizziness, orthopnea, PND, swelling or significant weight changes, acute bleeding, or claudication.   02/09/2024 - Since I have last seen him, he has been following [facility]. Underwent peripheral vascular cath on 01/27/2024 due to bilateral lower extremity critical and ischemic loss of the toes. Findings revealed sluggish flow in bilateral lower extremities, etiology most likely included heart failure, small vessel disease and foot, vasospasm.  It was suggested that he would benefit from a vasodilator.  Was started on nifedipine, however could not tolerate as he had a questionable syncopal episode on this medicine.  Patient is not 100% sure that he had a syncopal episode.  Says he was sitting in a chair and felt funny surrounding episode.  He says his wife witnessed him passing out though.  No longer taking nifedipine per his report. Denies any chest pain, shortness of breath, palpitations, syncope, orthopnea, PND, swelling or significant weight changes, acute bleeding, or claudication. Does admit to some dizziness at times.   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   ROS:   Please see the history of present illness.    All other systems reviewed and  are negative.  EKGs/Labs/Other Studies Reviewed:    The following studies were reviewed today:  EKG:  EKG Interpretation Date/Time:  Tuesday February 09 2024 13:29:10 EDT Ventricular Rate:  70 PR Interval:  128 QRS Duration:  82 QT Interval:  370 QTC Calculation: 399 R Axis:   20  Text  Interpretation: Normal sinus rhythm Inferior infarct , age undetermined When compared with ECG of 19-Jun-2023 08:29, No significant change was found Confirmed by Sharlene Dory [phone]) on 02/09/2024 1:56:44 PM    Abdominal aortogram 01/27/2024:  Impression: Sluggish flow in bilateral lower extremities.  Etiologies include heart failure, small vessel disease in the foot, vasospasm. [place] would benefit from new echo to quantify his ejection fraction.  He would also benefit from a vasodilator.  He was started on nifedipine last week, but had a syncopal episode yesterday.  Per [name], the syncopal episode occurred as he was discussing hernia surgery, which made him feel queasy.  He has had no other issues since starting the medication last Friday.   I plan to send this to Dr. Diona Browner and Sharlene Dory, NP, whom he sees for cardiology and his Primary [name] NP.   I think [name] would benefit from medication to help with lower extremity vasospasm as long as his Echo is okay. Due to blood pressure concerns, will defer further management to either his primary or cardiology.   First-line therapy is reducing exposure to cold, and eliminating ergot's such as caffeine which can cause vasospasm. From a medication standpoint, nifedipine, which is a calcium channel blocker, which can increase vasodilation in the fingers and toes. This can be prescribed in an extended release tablet.  I prescribed nifedipine, which he stated helped quite a bit.  He is currently off of this after the syncopal episode.  Can try a lower dose.   Should nifedipine not work, amlodipine has also proven to have some efficacy. Phosphodiesterase 5 inhibitors are second line therapy - Sildenafil, tadalafil, or vardenafil.   Should wounds worsen, the only therapy I could offer would be amputation.  I would defer this to podiatry at [facility].  ABI's 01/2024:  Summary:  Right: Resting right ankle-brachial index is within  normal range.   Flatline PPG waveforms of the 1st-3rd digits with severely dampended  4th-5th digits.   Left: Resting left ankle-brachial index is within normal range.   Flatline PPG waveforms of the 1st-3rd digits with severely dampended  4th-5th digits.  Limited echo 08/26/2023:   1. Left ventricular ejection fraction, by estimation, is 55 to 60%. The  left ventricle has normal function. The left ventricle has no regional  wall motion abnormalities. There is mild left ventricular hypertrophy.  Left ventricular diastolic parameters  are consistent with Grade I diastolic dysfunction (impaired relaxation).  The average left ventricular global longitudinal strain is -18.2 %. The  global longitudinal strain is normal.   2. Right ventricular systolic function is mildly reduced. The right  ventricular size is mildly enlarged. There is normal pulmonary artery  systolic pressure.   3. There is a 23 mm Inspiris Resilia + replacement of ascending aorta  with Hemashield platinum graft valve present in the aortic position.   4. The inferior vena cava is normal in size with greater than 50%  respiratory variability, suggesting right atrial pressure of 3 mmHg.   5. Limited echo evaluate LV function, evaluate pericardial effusion   6. No pericardial effusion.   Comparison(s): EF 45-50%. Small to moderate posterior pericardial  effusion. No evidence of cardiac tamponade.  Echo 06/2023:  1. Left ventricular ejection fraction, by estimation, is 45 to 50%. The  left ventricle has mildly decreased function. The left ventricle  demonstrates regional wall motion abnormalities (see scoring  diagram/findings for description). There is mild  concentric left ventricular hypertrophy. Left ventricular diastolic  parameters are consistent with Grade I diastolic dysfunction (impaired  relaxation).   2. Right ventricular systolic function is moderately reduced. The right  ventricular size is normal. There is  normal pulmonary artery systolic  pressure. The estimated right ventricular systolic pressure is 25.7 mmHg.   3. A small to moderate pericardial effusion is present. The pericardial  effusion is posterior to the left ventricle. There is no evidence of  cardiac tamponade.   4. The mitral valve is grossly normal. Mild mitral valve regurgitation.   5. The aortic valve has been repaired/replaced. Aortic valve  regurgitation is not visualized. There is a 23 mm Inspiris Resilia +  replacement of ascending aorta with Hemashield platinum graft valve  present in the aortic position. Procedure Date: 05/18/23.   Aortic valve mean gradient measures 9.0 mmHg.   6. The inferior vena cava is normal in size with greater than 50%  respiratory variability, suggesting right atrial pressure of 3 mmHg.   Comparison(s): Prior images reviewed side by side. LVEF mildly reduced in range of 45-50%. Bioprosthetic AVR in place with no aortic regurgitation and normal mean AV gradient of 9 mmHg. Small to moderate posterior pericardial effusion.  CT angio chest 12/2022: IMPRESSION: 1. Ascending thoracic aortic aneurysm measuring 53 x 50 mm in diameter. Recommend semi-annual imaging followup by CTA or MRA and referral to cardiothoracic surgery if not already obtained. This recommendation follows 2010 ACCF/AHA/AATS/ACR/ASA/SCA/SCAI/SIR/STS/SVM Guidelines for the Diagnosis and Management of Patients With Thoracic Aortic Disease. Circulation. 2010; 121: Z610-R604. Aortic aneurysm NOS (ICD10-I71.9) 2. Thickening and calcification of the aortic valve. 3. Coronary artery and Aortic Atherosclerosis (ICD10-I70.0).  Echocardiogram on 11/14/2022:  1. Left ventricular ejection fraction, by estimation, is 55 to 60%. The  left ventricle has normal function. The left ventricle has no regional  wall motion abnormalities. There is mild left ventricular hypertrophy.  Left ventricular diastolic parameters  are consistent with Grade I  diastolic dysfunction (impaired relaxation).  The average left ventricular global longitudinal strain is -19.0 %. The  global longitudinal strain is normal.   2. Right ventricular systolic function is normal. The right ventricular  size is normal. Tricuspid regurgitation signal is inadequate for assessing  PA pressure.   3. The mitral valve is normal in structure. Trivial mitral valve  regurgitation. No evidence of mitral stenosis.   4. The tricuspid valve is abnormal.   5. The aortic valve has an indeterminant number of cusps. Aortic valve  regurgitation is moderate. Severe aortic valve stenosis.   6. Limited visualization of the ascending aorta, the visualized parts are  at least moderately dilated. Consider additional imaging with CTA or MRA  to better evalute degree and extent of aortic aneurysm. Marland Kitchen Aortic  dilatation noted. There is mild  dilatation of the aortic root, measuring 42 mm. There is moderate  dilatation of the ascending aorta, measuring 48 mm.   7. The inferior vena cava is normal in size with greater than 50%  respiratory variability, suggesting right atrial pressure of 3 mmHg.  Physical Exam:    VS:  BP (!) 142/88   Pulse 74   Ht 5\' 8"  (1.727 m)   Wt 169 lb 3.2  oz (76.7 kg)   SpO2 97%   BMI 25.73 kg/m     Wt Readings from Last 3 Encounters:  02/09/24 169 lb 3.2 oz (76.7 kg)  01/27/24 162 lb (73.5 kg)  01/26/24 165 lb (74.8 kg)    Orthostatic vital signs:  Lying: 129/83, 62 bpm Sitting: 150/97, 69 bpm Standing: 156/101, 71 bpm Standing x 3 minutes: 120/87, 73 bpm  GEN: Well nourished, well developed in no acute distress HEENT: Normal NECK: No JVD; No carotid bruits CARDIAC: S1/S2, RRR, no murmur, no rubs or gallops noted; 2+ peripheral pulses along upper extremities RESPIRATORY:  Clear to auscultation without rales, wheezing or rhonchi MUSCULOSKELETAL:  No edema; No deformity  SKIN: Warm and dry, pale overall, purple hue to BLE skin, cool to skin along  BLE, +1 pedal pulses, capillary refill along BLE > 3 seconds.  NEUROLOGIC:  Alert and oriented x 3 PSYCHIATRIC:  Calm and pleasant  ASSESSMENT & PLAN:    In order of problems listed above:  1. Severe aortic stenosis, bicuspid aortic valve, s/p SAVR TTE 08/2023 showed normal AVR. No medication changes at this time. Discussed SBE prophylaxis. [facility].   2. CAD, status post NSTEMI in 2015 Denies any anginal symptoms, no indication for ischemic evaluation. Continue aspirin, Lipitor, and Lopressor. Heart healthy diet and regular physical activity as tolerated encouraged.  ED precautions discussed.   3. Thoracic aortic aneurysm, s/p repair CTA of chest revealed ascending thoracic aortic aneurysm measuring 53 x 50 mm. Underwent ascending aortic aneurysm repair on May 18, 2023 along with SAVR. Doing well. Denies any concerning symptoms. Follow-up with cardiothoracic surgery as scheduled. Care and ED precautions discussed. Heart healthy diet encouraged.   4. Hyperlipidemia Past LDL 75. Continue atorvastatin. Heart healthy diet encouraged.   5. Hypertension, dizziness Blood pressure today stable. Did have BP drop upon standing, however was in pain during position changes. Continue Lopressor.  Heart healthy diet encouraged. Discussed conservative measures for dizziness. Care and ED precautions discussed. He verbalized understanding. CBG normal today at 85.   6. PSVT,  bradycardia, post-op A-fib Denies any palpitations.  Heart rate WNL today.  Continue Lopressor. Heart healthy diet encouraged.   8. History of RCC, status post partial right nephrectomy in 2013 Denies any issues.  He follows up with nephrology annually.  Continue current medication regimen.  Continue to follow-up with nephrology.    9. Hx of CVA, Right side weakness Previous MRI of the brain showed scattered small acute infarcts in left cerebellar hemisphere in both cerebral hemispheres that were  likely embolic in etiology.  Neurology was consulted and was felt to be candidate for Coumadin due to new aortic valve as well as postoperative A-fib. Noted to have right upper extremity weakness after stroke, improving, right lower extremity weakness was there prior to stroke. Continue to follow with PCP.  10. Vasospastic limb ischemia No critical wounds noted to either lower extremity. RLE noted to be purple, cool to touch, and slow capillary refill, > 3 seconds. No gangrene on exam. Similar finding to LLE, however not as purple in color.  I have previously consulted with Dr. Sherral Hammers, [facility] regarding medical therapy.  Discussed that first-line therapy is reducing exposure to cold, eliminating triggers such as caffeine.  I discussed this with him and he admits to some caffeine intake.  He will reduce this.  If no improvement by next follow-up visit, plan to consider low-dose amlodipine.  Follow-up with [facility] and podiatry as scheduled.  Care and ED precautions discussed.  Will also obtain thyroid panel, ANA, ESR.  11. Dizziness, pale skin Does admit to some dizziness at times.  Orthostatics revealed drop in BP upon standing, however patient was in pain during position changes so difficult to distinguish if this is true orthostatic hypotension.  Will arrange monitor for further evaluation as he is unsure if he had a syncopal episode related to nifedipine-see above.  Will discontinue nifedipine and remove this from his medication list.  CBG today is 85.  Will obtain the following labs for further evaluation: CBC, CMET, iron panel, thyroid panel.   10. Disposition: Care and ED precautions discussed.  Follow-up with Dr. Diona Browner or APP in 6 weeks or sooner if anything changes.   Medication Adjustments/Labs and Tests Ordered: Current medicines are reviewed at length with the patient today.  Concerns regarding medicines are outlined above.  Orders Placed This Encounter  Procedures   CBC   Comprehensive  metabolic panel   Fe+TIBC+Fer   ANA   Sed Rate (ESR)   EKG 12-Lead   No orders of the defined types were placed in this encounter.   Patient Instructions  Medication Instructions:  Your physician has recommended you make the following change in your medication:  Stop Nifedipine Continue taking all other medications as prescribed  Labwork: CBC, CMET, Iron Panel, ANA, and ESR to be completed tomorrow at [facility] or [facility]  Testing/Procedures: .Your physician has recommended that you wear a Zio monitor.   This monitor is a medical device that records the heart's electrical activity. Doctors most often use these monitors to diagnose arrhythmias. Arrhythmias are problems with the speed or rhythm of the heartbeat. The monitor is a small device applied to your chest. You can wear one while you do your normal daily activities. While wearing this monitor if you have any symptoms to push the button and record what you felt. Once you have worn this monitor for the period of time provider prescribed (for 14 days), you will return the monitor device in the postage paid box. Once it is returned they will download the data collected and provide [place] with a report which the provider will then review and we will call you with those results. Important tips:  Avoid showering during the first 24 hours of wearing the monitor. Avoid excessive sweating to help maximize wear time. Do not submerge the device, no hot tubs, and no swimming pools. Keep any lotions or oils away from the patch. After 24 hours you may shower with the patch on. Take brief showers with your back facing the shower head.  Do not remove patch once it has been placed because that will interrupt data and decrease adhesive wear time. Push the button when you have any symptoms and write down what you were feeling. Once you have completed wearing your monitor, remove and place into box which has postage paid and place in your outgoing  mailbox.  If for some reason you have misplaced your box then call our office and we can provide another box and/or mail it off for you.   Follow-Up: Your physician recommends that you schedule a follow-up appointment in: 6 weeks  Any Other Special Instructions Will Be Listed Below (If Applicable). Thank you for choosing [facility]!     If you need a refill on your cardiac medications before your next appointment, please call your pharmacy.     Signed, Sharlene Dory, NP

## 2024-02-09 NOTE — Telephone Encounter (Signed)
 Checking percert on the following  2 week AT Dx: Presyncope

## 2024-02-15 ENCOUNTER — Ambulatory Visit (INDEPENDENT_AMBULATORY_CARE_PROVIDER_SITE_OTHER): Admitting: Podiatry

## 2024-02-15 DIAGNOSIS — M79674 Pain in right toe(s): Secondary | ICD-10-CM | POA: Diagnosis not present

## 2024-02-15 DIAGNOSIS — M79675 Pain in left toe(s): Secondary | ICD-10-CM | POA: Diagnosis not present

## 2024-02-15 DIAGNOSIS — I739 Peripheral vascular disease, unspecified: Secondary | ICD-10-CM | POA: Diagnosis not present

## 2024-02-15 DIAGNOSIS — B351 Tinea unguium: Secondary | ICD-10-CM

## 2024-02-15 DIAGNOSIS — Z7901 Long term (current) use of anticoagulants: Secondary | ICD-10-CM

## 2024-02-15 NOTE — Progress Notes (Unsigned)
 Subjective:  Patient ID: [name], male    DOB: 04/04/1956,  MRN: [id number]  [name] presents to clinic today for:  Chief Complaint  Patient presents with   Nail Problem    Nail trim    Patient notes nails are thick and elongated, causing pain in shoe gear when ambulating.  Patient states that he was referred here for care.  He acknowledges poor circulation in his legs.  Stating that he recently had a catheterization but told there were no actual blockages in his blood vessels.  He states he was told they will be watching him on a regular basis to make sure flow is adequate to his feet.  He also notes pain in his low back and feels that this aggravates his nerve issues down in his feet.  He has a history of stroke.  PCP is [name], PA-C.  Last seen around 12/09/23  Past Medical History:  Diagnosis Date   Arthritis    Coronary atherosclerosis of native coronary artery    a. NSTEMI (02/2014):  LHC (02/2014):  Mild disease in LAD and CFX; prox RCA occluded with R-R collats, dist RCA filled by L-R collats, inf HK, EF 55%, LVEDP 15 mmHg.  PCI:  Unsuccessful angioplasty of RCA (late presentation of inf MI) - tx medically.   Essential hypertension    GERD (gastroesophageal reflux disease)    Hyperlipidemia    NSTEMI (non-ST elevated myocardial infarction) (HCC) 03/11/14   Renal cell carcinoma of right kidney (HCC)    Partial nephrectomy in 2013    Allergies  Allergen Reactions   Alcohol Other (See Comments)    Pts skin gets red. Ex. Pt used alcohol based deodorant and got red things under arms. & Drinks alcohol notices "from heart on up" everything turns red.   Chlorthalidone     Dizziness and Syncopal Episode    Objective:  [name] is a pleasant 68 y.o. male in NAD. AAO x 3.  Vascular Examination: Patient has palpable DP pulse, absent PT pulse bilateral.  Delayed capillary refill bilateral toes.  Sparse digital hair bilateral.  Proximal to distal cooling  WNL bilateral.    Dermatological Examination: Interspaces are clear with no open lesions noted bilateral.  Skin is shiny and atrophic bilateral.  Nails are 3-22mm thick, with yellowish/brown discoloration, subungual debris and distal onycholysis x10.  There is pain with compression of nails x10.       Latest Ref Rng & Units 05/14/2023    2:00 PM  Hemoglobin A1C  Hemoglobin-A1c 4.8 - 5.6 % 5.8    Patient qualifies for at-risk foot care because of PVD.  Assessment/Plan: 1. Pain due to onychomycosis of toenails of both feet   2. PVD (peripheral vascular disease) (HCC)   3. Long term current use of anticoagulant     Mycotic nails x10 were sharply debrided with sterile nail nippers and power debriding burr to decrease bulk and length.   Return in about 3 months (around 05/16/2024) for RFC.   Clerance Lav, DPM, FACFAS [facility]     [address], [place] [address]  Office [phone]  Fax [phone]

## 2024-02-22 ENCOUNTER — Ambulatory Visit: Attending: Cardiology | Admitting: *Deleted

## 2024-02-22 DIAGNOSIS — I639 Cerebral infarction, unspecified: Secondary | ICD-10-CM | POA: Diagnosis not present

## 2024-02-22 DIAGNOSIS — Z5181 Encounter for therapeutic drug level monitoring: Secondary | ICD-10-CM | POA: Insufficient documentation

## 2024-02-22 DIAGNOSIS — Z952 Presence of prosthetic heart valve: Secondary | ICD-10-CM | POA: Insufficient documentation

## 2024-02-22 DIAGNOSIS — I480 Paroxysmal atrial fibrillation: Secondary | ICD-10-CM | POA: Insufficient documentation

## 2024-02-22 LAB — POCT INR: INR: 2.3 (ref 2.0–3.0)

## 2024-02-22 NOTE — Patient Instructions (Signed)
 Continue warfarin 2 tablets daily except 2 1/2 on Tuesdays and Saturdays Recheck in 4 weeks Pending hernia surgery.  Not scheduled yet.

## 2024-02-24 ENCOUNTER — Encounter: Payer: Self-pay | Admitting: *Deleted

## 2024-03-03 DIAGNOSIS — R55 Syncope and collapse: Secondary | ICD-10-CM | POA: Diagnosis not present

## 2024-03-03 DIAGNOSIS — R42 Dizziness and giddiness: Secondary | ICD-10-CM

## 2024-03-08 ENCOUNTER — Ambulatory Visit: Payer: Medicare Other | Admitting: Nurse Practitioner

## 2024-03-10 ENCOUNTER — Encounter: Payer: Self-pay | Admitting: Physical Medicine & Rehabilitation

## 2024-03-10 ENCOUNTER — Encounter: Payer: Medicare Other | Attending: Physical Medicine & Rehabilitation | Admitting: Physical Medicine & Rehabilitation

## 2024-03-10 VITALS — BP 141/81 | HR 61 | Ht 68.0 in | Wt 171.0 lb

## 2024-03-10 DIAGNOSIS — G8111 Spastic hemiplegia affecting right dominant side: Secondary | ICD-10-CM | POA: Diagnosis present

## 2024-03-10 MED ORDER — SODIUM CHLORIDE (PF) 0.9 % IJ SOLN
4.0000 mL | Freq: Once | INTRAMUSCULAR | Status: AC
  Administered 2024-03-10: 4 mL

## 2024-03-10 MED ORDER — INCOBOTULINUMTOXINA 100 UNITS IM SOLR
300.0000 [IU] | Freq: Once | INTRAMUSCULAR | Status: AC
  Administered 2024-03-10: 300 [IU] via INTRAMUSCULAR

## 2024-03-10 NOTE — Progress Notes (Signed)
 Subjective:    Patient ID: [name], male    DOB: [birth year], 68 y.o.   MRN: [id number]  HPI  C4-5 ACDF 2022, per NS has had spasticity in RLE   Status post AVR and ascending aortic aneurysm repair 05/18/2023.  The patient had some mental status changes prompting MRI of the brain showing scattered small acute infarcts left cerebellar hemispheres. The patient has been evaluated for chronic low back pain as well as neck pain.  He was seen by Dr. Rochelle Chu who has ordered MRIs of the neck and low back. Dr. Rochelle Chu evaluated the patient and found spasticity primarily affecting the right lower extremity.  Patient has some chronic mild weakness in the right upper extremity as well but still uses the right upper extremity for functional tasks.  He does note some increased tone  Pain Inventory Average Pain 3 Pain Right Now 3 My pain is constant, burning, and tingling  In the last 24 hours, has pain interfered with the following? General activity 4 Relation with others 4 Enjoyment of life 7 What TIME of day is your pain at its worst? evening Sleep (in general) Good  Pain is worse with: unsure Pain improves with: rest and pacing activities Relief from Meds: 0  Family History  Problem Relation Age of Onset   Renal Disease Daughter    Hypertension Daughter    Social History   Socioeconomic History   Marital status: Married    Spouse name: Not on file   Number of children: 1   Years of education: 14   Highest education level: Not on file  Occupational History   Occupation: Retired   Tobacco Use   Smoking status: Former    Current packs/day: 0.00    Types: Cigarettes    Quit date: 11/18/1979    Years since quitting: 44.3   Smokeless tobacco: Never  Vaping Use   Vaping status: Never Used  Substance and Sexual Activity   Alcohol use: No    Alcohol/week: 0.0 standard drinks of alcohol   Drug use: Yes    Frequency: 7.0 times per week    Types: Marijuana    Comment: every day    Sexual activity: Not on file  Other Topics Concern   Not on file  Social History Narrative   Lives w/ wife   Caffeine use: 1 cup coffee every morning   Right handed   Social Drivers of Health   Financial Resource Strain: Not on file  Food Insecurity: No Food Insecurity (05/18/2023)   Hunger Vital Sign    Worried About Running Out of Food in the Last Year: Never true    Ran Out of Food in the Last Year: Never true  Transportation Needs: No Transportation Needs (05/18/2023)   PRAPARE - Administrator, Civil Service (Medical): No    Lack of Transportation (Non-Medical): No  Physical Activity: Not on file  Stress: Not on file  Social Connections: Not on file   Past Surgical History:  Procedure Laterality Date   ABDOMINAL AORTOGRAM W/LOWER EXTREMITY Bilateral 01/27/2024   Procedure: ABDOMINAL AORTOGRAM W/LOWER EXTREMITY;  Surgeon: Kayla Part, MD;  Location: [facility] INVASIVE CV LAB;  Service: Cardiovascular;  Laterality: Bilateral;   AORTIC VALVE REPLACEMENT N/A 05/18/2023   Procedure: AORTIC VALVE REPLACEMENT (AVR) USING INSPIRIS RESILIA 23 MM AORTIC VALVE;  Surgeon: Bartley Lightning, MD;  Location: [facility] OR;  Service: Open Heart Surgery;  Laterality: N/A;   BACK SURGERY  1989   CERVICAL DISC SURGERY  04/29/2021   COLONOSCOPY N/A 12/11/2020   Procedure: COLONOSCOPY;  Surgeon: Alanda Allegra, MD;  Location: [facility];  Service: Gastroenterology;  Laterality: N/A;   LEFT HEART CATHETERIZATION WITH CORONARY ANGIOGRAM N/A 03/17/2014   Procedure: LEFT HEART CATHETERIZATION WITH CORONARY ANGIOGRAM;  Surgeon: Lucendia Rusk, MD;  Location[facility] C[facility] LAB;  Service: Cardiovascular;  Laterality: N/A;   POLYPECTOMY  12/11/2020   Procedure: POLYPECTOMY;  Surgeon: Alanda Allegra, MD;  Location: [facility];  Service: Gastroenterology;;   REPLACEMENT ASCENDING AORTA N/A 05/18/2023   Procedure: REPLACEMENT OF ASCENDING AORTIC ANEURYSM USING A 30 X 10 MM HEMASHIELD PLATINUM GRAFT;   Surgeon: Bartley Lightning, MD;  Location: [facility] OR;  Service: Open Heart Surgery;  Laterality: N/A;  CIRC ARREST   RIGHT HEART CATH AND CORONARY ANGIOGRAPHY N/A 04/03/2023   Procedure: RIGHT HEART CATH AND CORONARY ANGIOGRAPHY;  Surgeon: Arnoldo Lapping, MD;  Location: [facility] INVASIVE CV LAB;  Service: Cardiovascular;  Laterality: N/A;   ROBOT ASSISTED LAPAROSCOPIC NEPHRECTOMY  01/14/2012   Procedure: ROBOTIC ASSISTED LAPAROSCOPIC NEPHRECTOMY;  Surgeon: Soledad Dupes, MD;  Location: [facility] ORS;  Service: Urology;  Laterality: Right;  Robot Laparoscopic Right Partial Nephrectomy     TEE WITHOUT CARDIOVERSION N/A 05/18/2023   Procedure: TRANSESOPHAGEAL ECHOCARDIOGRAM;  Surgeon: Bartley Lightning, MD;  Location: [facility] OR;  Service: Open Heart Surgery;  Laterality: N/A;   TONSILLECTOMY     Past Surgical History:  Procedure Laterality Date   ABDOMINAL AORTOGRAM W/LOWER EXTREMITY Bilateral 01/27/2024   Procedure: ABDOMINAL AORTOGRAM W/LOWER EXTREMITY;  Surgeon: Kayla Part, MD;  Location: [facility] INVASIVE CV LAB;  Service: Cardiovascular;  Laterality: Bilateral;   AORTIC VALVE REPLACEMENT N/A 05/18/2023   Procedure: AORTIC VALVE REPLACEMENT (AVR) USING INSPIRIS RESILIA 23 MM AORTIC VALVE;  Surgeon: Bartley Lightning, MD;  Location: [facility] OR;  Service: Open Heart Surgery;  Laterality: N/A;   BACK SURGERY     1989   CERVICAL DISC SURGERY  04/29/2021   COLONOSCOPY N/A 12/11/2020   Procedure: COLONOSCOPY;  Surgeon: Alanda Allegra, MD;  [facility]: [facility];  Service: Gastroenterology;  Laterality: N/A;   LEFT HEART CATHETERIZATION WITH CORONARY ANGIOGRAM N/A 03/17/2014   Procedure: LEFT HEART CATHETERIZATION WITH CORONARY ANGIOGRAM;  Surgeon: Lucendia Rusk, MD;  Location: [facility] CATH LAB;  Service: Cardiovascular;  Laterality: N/A;   POLYPECTOMY  12/11/2020   Procedure: POLYPECTOMY;  Surgeon: Alanda Allegra, MD;  [facility]: [facility];  Service: Gastroenterology;;   REPLACEMENT ASCENDING AORTA N/A 05/18/2023    Procedure: REPLACEMENT OF ASCENDING AORTIC ANEURYSM USING A 30 X 10 MM HEMASHIELD PLATINUM GRAFT;  Surgeon: Bartley Lightning, MD;  Location: [facility] OR;  Service: Open Heart Surgery;  Laterality: N/A;  CIRC ARREST   RIGHT HEART CATH AND CORONARY ANGIOGRAPHY N/A 04/03/2023   Procedure: RIGHT HEART CATH AND CORONARY ANGIOGRAPHY;  Surgeon: Arnoldo Lapping, MD;  Location: [facility] INVASIVE CV LAB;  Service: Cardiovascular;  Laterality: N/A;   ROBOT ASSISTED LAPAROSCOPIC NEPHRECTOMY  01/14/2012   Procedure: ROBOTIC ASSISTED LAPAROSCOPIC NEPHRECTOMY;  Surgeon: Soledad Dupes, MD;  Location: [facility] ORS;  Service: Urology;  Laterality: Right;  Robot Laparoscopic Right Partial Nephrectomy     TEE WITHOUT CARDIOVERSION N/A 05/18/2023   Procedure: TRANSESOPHAGEAL ECHOCARDIOGRAM;  Surgeon: Bartley Lightning, MD;  Location: [facility] OR;  Service: Open Heart Surgery;  Laterality: N/A;   TONSILLECTOMY     Past Medical History:  Diagnosis Date   Arthritis    Coronary atherosclerosis of  native coronary artery    a. NSTEMI (02/2014):  LHC (02/2014):  Mild disease in LAD and CFX; prox RCA occluded with R-R collats, dist RCA filled by L-R collats, inf HK, EF 55%, LVEDP 15 mmHg.  PCI:  Unsuccessful angioplasty of RCA (late presentation of inf MI) - tx medically.   Essential hypertension    GERD (gastroesophageal reflux disease)    Hyperlipidemia    NSTEMI (non-ST elevated myocardial infarction) (HCC) 03/11/14   Renal cell carcinoma of right kidney (HCC)    Partial nephrectomy in 2013   Ht 5\' 8"  (1.727 m)   Wt 171 lb (77.6 kg)   BMI 26.00 kg/m   Opioid Risk Score:   Fall Risk Score:  `1  Depression screen [facility] 2/9     03/10/2024   11:36 AM 12/15/2023   11:57 AM 09/15/2023   11:45 AM 07/03/2023    1:44 PM  Depression screen PHQ 2/9  Decreased Interest 0 2 0 0  Down, Depressed, Hopeless 0 2 0 0  PHQ - 2 Score 0 4 0 0  Altered sleeping    1  Tired, decreased energy    1  Change in appetite    1  Feeling bad or failure  about yourself     0  Trouble concentrating    0  Moving slowly or fidgety/restless    0  Suicidal thoughts    0  PHQ-9 Score    3    Review of Systems  Musculoskeletal:  Positive for gait problem.       Pain fingers on both hands.Pain in both legs from the knees down to both feet  All other systems reviewed and are negative.      Objective:   Physical Exam  General no acute distress Mood and affect appropriate [place]one MAS 2/3 in the right finger and wrist flexors MAS 2 at the elbow flexors Right lower extremity MAS 3 at the plantar flexors as well as posterior tibialis. With ambulation has poor toe clearance as well as some foot inversion during swing phase. Sensation equal bilateral upper and lower limbs to light touch except at the fingertips bilaterally. Speech without dysarthria or aphasia DTRs are 3+ bilateral  biceps triceps brachioradialis ankle and knee     Assessment & Plan:   1.  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  His primary involved muscles are the ankle plantar flexors as well as foot inverters i.e. tibialis posterior Recommend botulinum toxin injection right lower extremity.  Consider for right upper extremity although he is functionally using the hand  Xeomin    Botox Injection for spasticity using needle EMG guidance  Dilution: 50 Units/ml Indication: Severe spasticity which interferes with ADL,mobility and/or  hygiene and is unresponsive to medication management and other conservative care Informed consent was obtained after describing risks and benefits of the procedure with the patient. This includes bleeding, bruising, infection, excessive weakness, or medication side effects. A REMS form is on file and signed. Needle: 27-gauge 1 inch needle electrode Number of units per muscle  Right Medial gastrosoleus 200 units Posterior tibialis 75 units  All injections were  done after obtaining appropriate EMG activity and after negative drawback for blood. The patient tolerated the procedure well. Post procedure instructions were given. A followup appointment was made.  6-8wk

## 2024-03-11 ENCOUNTER — Encounter: Payer: Self-pay | Admitting: Nurse Practitioner

## 2024-03-11 ENCOUNTER — Ambulatory Visit: Attending: Nurse Practitioner | Admitting: Nurse Practitioner

## 2024-03-11 VITALS — BP 118/74 | HR 61 | Ht 68.0 in | Wt 171.0 lb

## 2024-03-11 DIAGNOSIS — Z9889 Other specified postprocedural states: Secondary | ICD-10-CM | POA: Insufficient documentation

## 2024-03-11 DIAGNOSIS — I10 Essential (primary) hypertension: Secondary | ICD-10-CM | POA: Insufficient documentation

## 2024-03-11 DIAGNOSIS — Z01810 Encounter for preprocedural cardiovascular examination: Secondary | ICD-10-CM | POA: Diagnosis present

## 2024-03-11 DIAGNOSIS — Z952 Presence of prosthetic heart valve: Secondary | ICD-10-CM | POA: Diagnosis present

## 2024-03-11 DIAGNOSIS — E875 Hyperkalemia: Secondary | ICD-10-CM | POA: Insufficient documentation

## 2024-03-11 DIAGNOSIS — Z85528 Personal history of other malignant neoplasm of kidney: Secondary | ICD-10-CM | POA: Insufficient documentation

## 2024-03-11 DIAGNOSIS — Z8679 Personal history of other diseases of the circulatory system: Secondary | ICD-10-CM | POA: Insufficient documentation

## 2024-03-11 DIAGNOSIS — R079 Chest pain, unspecified: Secondary | ICD-10-CM | POA: Diagnosis present

## 2024-03-11 DIAGNOSIS — I2510 Atherosclerotic heart disease of native coronary artery without angina pectoris: Secondary | ICD-10-CM | POA: Diagnosis present

## 2024-03-11 DIAGNOSIS — I998 Other disorder of circulatory system: Secondary | ICD-10-CM | POA: Diagnosis present

## 2024-03-11 DIAGNOSIS — R001 Bradycardia, unspecified: Secondary | ICD-10-CM | POA: Insufficient documentation

## 2024-03-11 DIAGNOSIS — I4891 Unspecified atrial fibrillation: Secondary | ICD-10-CM | POA: Diagnosis present

## 2024-03-11 DIAGNOSIS — E785 Hyperlipidemia, unspecified: Secondary | ICD-10-CM | POA: Insufficient documentation

## 2024-03-11 DIAGNOSIS — I471 Supraventricular tachycardia, unspecified: Secondary | ICD-10-CM | POA: Diagnosis present

## 2024-03-11 DIAGNOSIS — R531 Weakness: Secondary | ICD-10-CM | POA: Insufficient documentation

## 2024-03-11 DIAGNOSIS — Z8673 Personal history of transient ischemic attack (TIA), and cerebral infarction without residual deficits: Secondary | ICD-10-CM | POA: Diagnosis present

## 2024-03-11 NOTE — Progress Notes (Signed)
 Cardiology Office Note:   Date:  03/11/2024 ID:  [name], [name] 06/10/1956, MRN [id number] PCP:  Curtis Jarred, PA-C [facility] Providers Cardiologist:  Teddie Favre, MD    Referring MD: Curtis Jarred, PA-C  CC: Here for follow-up  History of Present Illness:    [name] is a very delightful 68 y.o. male with a PMH of severe AS due to bicuspid aortic valve, s/p AVR and ascending aortic aneurysm repair on May 18, 2023, CAD, s/p NSTEMI in 2015, HTN, PSVT, HLD, hx of RCC, s/p partial right nephrectomy in 2013, who presents today for follow-up.    Had an NSTEMI in 2015, underwent cardiac cath that showed occlusion of RCA filled by collaterals and had unsuccessful PCI, medical management recommended.  History of renal cell carcinoma, s/p partial nephrectomy in 2013.   Intraoperative TEE with EF approximately 50%.  Later in the evening on July 3 was noted to have right hand weakness and rapid response team was called.  Noted to have A-fib with RVR was hypotensive.  Was started on amiodarone  infusion, cardiology was consulted.  Transition to amiodarone  200 mg twice daily.  CT of the head without evidence of acute infarct or hemorrhage, however MRI of the brain showed scattered small acute infarcts in left cerebellar hemisphere in both cerebral hemispheres that were likely embolic in etiology.  Neurology was consulted and was felt to be candidate for Coumadin  due to new aortic valve as well as postoperative A-fib.  No DVT was noted.  06/19/2023 - Today he presents for post AVR follow-up.  He states that he is doing well since leaving the hospital. Participating in home health therapy. Does have a deficit, particularly right upper extremity weakness since the stroke, states right leg weakness has been occurring for 2 years since before his stroke.  Has been having some issues with constipation and appetite.  Weight has been stable recently. Denies any chest pain, shortness of breath,  palpitations, syncope, presyncope, dizziness, orthopnea, PND, swelling or significant weight changes, acute bleeding, or claudication.  09/07/2023 - Doing well.  He is interested in consulting a neurosurgeon based on his recent symptoms. Continuing to work with therapy, still experiencing right sided weakness, but right side grip is improving per his report. Denies any chest pain, shortness of breath, palpitations, syncope, presyncope, dizziness, orthopnea, PND, swelling or significant weight changes, acute bleeding, or claudication.   02/09/2024 - Since I have last seen him, he has been following [facility]. Underwent peripheral vascular cath on 01/27/2024 due to bilateral lower extremity critical and ischemic loss of the toes. Findings revealed sluggish flow in bilateral lower extremities, etiology most likely included heart failure, small vessel disease and foot, vasospasm.  It was suggested that he would benefit from a vasodilator.  Was started on nifedipine , however could not tolerate as he had a questionable syncopal episode on this medicine.  Patient is not 100% sure that he had a syncopal episode.  Says he was sitting in a chair and felt funny surrounding episode.  He says his wife witnessed him passing out though.  No longer taking nifedipine  per his report. Denies any chest pain, shortness of breath, palpitations, syncope, orthopnea, PND, swelling or significant weight changes, acute bleeding, or claudication. Does admit to some dizziness at times.   03/11/2024 - Presents today for follow-up. Denies any recurrent dizziness or near syncopal/syncopal episodes. Says this has resolved after cutting back on caffeine intake. Says his legs look better in appearance, has been  getting Botox injections, does admit to leg tingling at times. Admits to burning sensation "like after a workout" along mid to right side of chest and also right shoulder, says this has been going on ever since his open heart surgery, says it is  intermittent and notices it now. Says he is wondering if he is overdoing it, says he does a lot of household work. However, he avoids heavy lifting because of his hernia. Wants to know when he can have surgery. Denies any shortness of breath, palpitations, syncope, presyncope, dizziness, orthopnea, PND, swelling or significant weight changes, or claudication. Admits to some scattered bruising.    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   ROS:   Please see the history of present illness.    All other systems reviewed and are negative.  EKGs/Labs/Other Studies Reviewed:    The following studies were reviewed today:  EKG:  EKG Interpretation Date/Time:  Friday March 11 2024 10:35:52 EDT Ventricular Rate:  58 PR Interval:  152 QRS Duration:  74 QT Interval:  406 QTC Calculation: 398 R Axis:   51  Text Interpretation: Sinus bradycardia Anterior infarct , age undetermined T wave abnormality, consider inferolateral ischemia When compared with ECG of 09-Feb-2024 13:29, Inverted T waves have replaced nonspecific T wave abnormality in Anterior leads Confirmed by Lasalle Pointer [phone]) on 03/11/2024 10:38:31 AM   Cardiac monitor 02/2024:  ZIO AT reviewed.  13 days, 22 hours analyzed.   Predominant rhythm is sinus with heart rate ranging from 48 bpm up to 126 bpm and average heart rate 69 bpm. There were rare PACs including atrial couplets and triplets representing less than 1% total beats. There were occasional PVCs representing 4.1% total beats with otherwise rare ventricular couplets and triplets representing less than 1% total beats.  Also limited episodes of ventricular bigeminy and trigeminy.  There were 6 runs of NSVT, the longest of which lasted for 12.9 seconds (cannot exclude aberrantly conducted SVT). Multiple (39) episodes of PSVT were noted, the longest of which lasted for 24.8 seconds. No pauses or high degree heart block.  Abdominal aortogram 01/27/2024:   Impression: Sluggish flow in bilateral lower extremities.  Etiologies include heart failure, small vessel disease in the foot, vasospasm. Deaglan would benefit from new echo to quantify his ejection fraction.  He would also benefit from a vasodilator.  He was started on nifedipine  last week, but had a syncopal episode yesterday.  Per Andy Bannister, the syncopal episode occurred as he was discussing hernia surgery, which made him feel queasy.  He has had no other issues since starting the medication last Friday.   I plan to send this to Dr. Londa Rival and Lasalle Pointer, NP, whom he sees for cardiology and his Primary Curtis Jarred NP.   I think Zander would benefit from medication to help with lower extremity vasospasm as long as his Echo is okay. Due to blood pressure concerns, will defer further management to either his primary or cardiology.   First-line therapy is reducing exposure to cold, and eliminating ergot's such as caffeine which can cause vasospasm. From a medication standpoint, nifedipine , which is a calcium  channel blocker, which can increase vasodilation in the fingers and toes. This can be prescribed in an extended release tablet.  I prescribed nifedipine , which he stated helped quite a bit.  He is currently off of this after the syncopal episode.  Can try a lower dose.   Should nifedipine  not work, amlodipine  has also  proven to have some efficacy. Phosphodiesterase 5 inhibitors are second line therapy - Sildenafil, tadalafil, or vardenafil.   Should wounds worsen, the only therapy I could offer would be amputation.  I would defer this to podiatry at [facility].  ABI's 01/2024:  Summary:  Right: Resting right ankle-brachial index is within normal range.   Flatline PPG waveforms of the 1st-3rd digits with severely dampended  4th-5th digits.   Left: Resting left ankle-brachial index is within normal range.   Flatline PPG waveforms of the 1st-3rd digits with severely dampended   4th-5th digits.  Limited echo 08/26/2023:   1. Left ventricular ejection fraction, by estimation, is 55 to 60%. The  left ventricle has normal function. The left ventricle has no regional  wall motion abnormalities. There is mild left ventricular hypertrophy.  Left ventricular diastolic parameters  are consistent with Grade I diastolic dysfunction (impaired relaxation).  The average left ventricular global longitudinal strain is -18.2 %. The  global longitudinal strain is normal.   2. Right ventricular systolic function is mildly reduced. The right  ventricular size is mildly enlarged. There is normal pulmonary artery  systolic pressure.   3. There is a 23 mm Inspiris Resilia + replacement of ascending aorta  with Hemashield platinum graft valve present in the aortic position.   4. The inferior vena cava is normal in size with greater than 50%  respiratory variability, suggesting right atrial pressure of 3 mmHg.   5. Limited echo evaluate LV function, evaluate pericardial effusion   6. No pericardial effusion.   Comparison(s): EF 45-50%. Small to moderate posterior pericardial  effusion. No evidence of cardiac tamponade.  Echo 06/2023:  1. Left ventricular ejection fraction, by estimation, is 45 to 50%. The  left ventricle has mildly decreased function. The left ventricle  demonstrates regional wall motion abnormalities (see scoring  diagram/findings for description). There is mild  concentric left ventricular hypertrophy. Left ventricular diastolic  parameters are consistent with Grade I diastolic dysfunction (impaired  relaxation).   2. Right ventricular systolic function is moderately reduced. The right  ventricular size is normal. There is normal pulmonary artery systolic  pressure. The estimated right ventricular systolic pressure is 25.7 mmHg.   3. A small to moderate pericardial effusion is present. The pericardial  effusion is posterior to the left ventricle. There is no  evidence of  cardiac tamponade.   4. The mitral valve is grossly normal. Mild mitral valve regurgitation.   5. The aortic valve has been repaired/replaced. Aortic valve  regurgitation is not visualized. There is a 23 mm Inspiris Resilia +  replacement of ascending aorta with Hemashield platinum graft valve  present in the aortic position. Procedure Date: 05/18/23.   Aortic valve mean gradient measures 9.0 mmHg.   6. The inferior vena cava is normal in size with greater than 50%  respiratory variability, suggesting right atrial pressure of 3 mmHg.   Comparison(s): Prior images reviewed side by side. LVEF mildly reduced in range of 45-50%. Bioprosthetic AVR in place with no aortic regurgitation and normal mean AV gradient of 9 mmHg. Small to moderate posterior pericardial effusion.  CT angio chest 12/2022: IMPRESSION: 1. Ascending thoracic aortic aneurysm measuring 53 x 50 mm in diameter. Recommend semi-annual imaging followup by CTA or MRA and referral to cardiothoracic surgery if not already obtained. This recommendation follows 2010 ACCF/AHA/AATS/ACR/ASA/SCA/SCAI/SIR/STS/SVM Guidelines for the Diagnosis and Management of Patients With Thoracic Aortic Disease. Circulation. 2010; 121: Z610-R604. Aortic aneurysm NOS (ICD10-I71.9) 2. Thickening and  calcification of the aortic valve. 3. Coronary artery and Aortic Atherosclerosis (ICD10-I70.0).  Echocardiogram on 11/14/2022:  1. Left ventricular ejection fraction, by estimation, is 55 to 60%. The  left ventricle has normal function. The left ventricle has no regional  wall motion abnormalities. There is mild left ventricular hypertrophy.  Left ventricular diastolic parameters  are consistent with Grade I diastolic dysfunction (impaired relaxation).  The average left ventricular global longitudinal strain is -19.0 %. The  global longitudinal strain is normal.   2. Right ventricular systolic function is normal. The right ventricular  size is  normal. Tricuspid regurgitation signal is inadequate for assessing  PA pressure.   3. The mitral valve is normal in structure. Trivial mitral valve  regurgitation. No evidence of mitral stenosis.   4. The tricuspid valve is abnormal.   5. The aortic valve has an indeterminant number of cusps. Aortic valve  regurgitation is moderate. Severe aortic valve stenosis.   6. Limited visualization of the ascending aorta, the visualized parts are  at least moderately dilated. Consider additional imaging with CTA or MRA  to better evalute degree and extent of aortic aneurysm. Aaron Aas Aortic  dilatation noted. There is mild  dilatation of the aortic root, measuring 42 mm. There is moderate  dilatation of the ascending aorta, measuring 48 mm.   7. The inferior vena cava is normal in size with greater than 50%  respiratory variability, suggesting right atrial pressure of 3 mmHg.  Physical Exam:    VS:  BP 118/74   Pulse 61   Ht 5\' 8"  (1.727 m)   Wt 171 lb (77.6 kg)   SpO2 98%   BMI 26.00 kg/m     Wt Readings from Last 3 Encounters:  03/11/24 171 lb (77.6 kg)  03/10/24 171 lb (77.6 kg)  02/09/24 169 lb 3.2 oz (76.7 kg)    GEN: Well nourished, well developed in no acute distress HEENT: Normal NECK: No JVD; No carotid bruits CARDIAC: S1/S2, RRR, no murmur, no rubs or gallops noted; 2+ peripheral pulses along upper extremities RESPIRATORY:  Clear to auscultation without rales, wheezing or rhonchi MUSCULOSKELETAL:  No edema; No deformity  SKIN: Warm and dry, improved color of skin to BLE, +2 PT pulses NEUROLOGIC:  Alert and oriented x 3 PSYCHIATRIC:  Calm and pleasant  ASSESSMENT & PLAN:    In order of problems listed above:  1. Severe aortic stenosis, bicuspid aortic valve, s/p SAVR TTE 08/2023 showed normal AVR. No medication changes at this time. Discussed SBE prophylaxis. Continue to follow-up at the [facility].   2. CAD, status post NSTEMI in 2015, chest pain of uncertain  etiology Admits to atypical symptoms. No acute ischemic changes on EKG. Due to high RCRI risk and current symptoms, will arrange labs including BMET, ESR, and ANA. If kidney function permits, will arrange CCTA prior to hernia surgery. Continue aspirin , Lipitor , and Lopressor . Heart healthy diet and regular physical activity as tolerated encouraged.  ED precautions discussed. Will confirm to see if Aspirin  can be d/c due to being on Coumadin .   3. Thoracic aortic aneurysm, s/p repair CTA of chest revealed ascending thoracic aortic aneurysm measuring 53 x 50 mm. Underwent ascending aortic aneurysm repair on May 18, 2023 along with SAVR. Doing well. Denies any concerning symptoms. Follow-up with cardiothoracic surgery as scheduled. Care and ED precautions discussed. Heart healthy diet encouraged. Depending on labs and if WNL, will arrange CCTA in future.   4. Hyperlipidemia Past LDL 75. Continue atorvastatin . Heart healthy  diet encouraged.   5. Hypertension Blood pressure today stable. Continue Lopressor .  Heart healthy diet encouraged. Care and ED precautions discussed.   6. PSVT,  bradycardia, post-op A-fib Denies any palpitations.  Heart rate WNL today.  See recent monitor report above. Continue Lopressor . Heart healthy diet encouraged.   7. History of RCC, status post partial right nephrectomy in 2013 Denies any issues.  He follows up with nephrology annually.  Continue current medication regimen.  Continue to follow-up with nephrology.    8. Hx of CVA, Right side weakness Previous MRI of the brain showed scattered small acute infarcts in left cerebellar hemisphere in both cerebral hemispheres that were likely embolic in etiology.  Neurology was consulted and was felt to be candidate for Coumadin  due to new aortic valve as well as postoperative A-fib. Noted to have right upper extremity weakness after stroke, improving, right lower extremity weakness was there prior to stroke. Continue to follow  with PCP.  9. Vasospastic limb ischemia No critical wounds noted to either lower extremity. Circulation to lower extremities appears improved since last office visit after lifestyle changes. I have previously consulted with Dr. Christia Cowboy, [facility] regarding medical therapy.  Discussed that first-line therapy is reducing exposure to cold, eliminating triggers such as caffeine.  If no improvement by next follow-up visit, plan to consider low-dose amlodipine .  Follow-up with [facility] and podiatry as scheduled. Care and ED precautions discussed.  Will also obtain previous labs.    10. Pre-operative cardiovascular risk assessment Mr. Volcy's perioperative risk of a major cardiac event is 11% according to the Revised Cardiac Risk Index (RCRI).  Therefore, he is at high risk for perioperative complications.   His functional capacity is fair at 4.64 METs according to the Duke Activity Status Index (DASI). Recommendations: According to ACC/AHA guidelines, no further cardiovascular testing needed.  The patient may proceed to surgery at acceptable risk.   However due to recent symptoms, will arrange CCTA if labs are unremarkable.  Antiplatelet and/or Anticoagulation Recommendations: Once testing has been completed, will consult clinical pharmacy regarding holding Coumadin .   11. Hyperkalemia Most recent labs revealed K+ level of 5.2. Will update BMET at this time. Continue to follow with PCP.  12. Disposition: Care and ED precautions discussed.  Follow-up with Dr. Londa Rival or APP in 3-4weeks or sooner if anything changes.   Medication Adjustments/Labs and Tests Ordered: Current medicines are reviewed at length with the patient today.  Concerns regarding medicines are outlined above.  Orders Placed This Encounter  Procedures   Basic Metabolic Panel (BMET)   EKG 12-Lead   No orders of the defined types were placed in this encounter.   Patient Instructions  Medication Instructions:  Your physician recommends  that you continue on your current medications as directed. Please refer to the Current Medication list given to you today.  Labwork: In 1 week at [facility]   Testing/Procedures: None   Follow-Up: Your physician recommends that you schedule a follow-up appointment in: 3-4 weeks   Any Other Special Instructions Will Be Listed Below (If Applicable).  If you need a refill on your cardiac medications before your next appointment, please call your pharmacy.    Signed, Lasalle Pointer, NP

## 2024-03-11 NOTE — Patient Instructions (Addendum)
 Medication Instructions:  Your physician recommends that you continue on your current medications as directed. Please refer to the Current Medication list given to you today.  Labwork: In 1 week at [facility]   Testing/Procedures: None   Follow-Up: Your physician recommends that you schedule a follow-up appointment in: 3-4 weeks   Any Other Special Instructions Will Be Listed Below (If Applicable).  If you need a refill on your cardiac medications before your next appointment, please call your pharmacy.

## 2024-03-21 ENCOUNTER — Ambulatory Visit: Attending: Cardiology | Admitting: *Deleted

## 2024-03-21 DIAGNOSIS — Z952 Presence of prosthetic heart valve: Secondary | ICD-10-CM | POA: Insufficient documentation

## 2024-03-21 DIAGNOSIS — I639 Cerebral infarction, unspecified: Secondary | ICD-10-CM | POA: Diagnosis present

## 2024-03-21 DIAGNOSIS — Z5181 Encounter for therapeutic drug level monitoring: Secondary | ICD-10-CM | POA: Diagnosis present

## 2024-03-21 DIAGNOSIS — I480 Paroxysmal atrial fibrillation: Secondary | ICD-10-CM | POA: Insufficient documentation

## 2024-03-21 LAB — POCT INR: INR: 3 (ref 2.0–3.0)

## 2024-03-21 NOTE — Patient Instructions (Signed)
 Continue warfarin 2 tablets daily except 2 1/2 on Tuesdays and Saturdays Recheck in 4 weeks Pending hernia surgery.  Not scheduled yet.

## 2024-03-24 ENCOUNTER — Other Ambulatory Visit: Payer: Self-pay | Admitting: *Deleted

## 2024-03-24 ENCOUNTER — Ambulatory Visit: Admitting: Nurse Practitioner

## 2024-03-24 DIAGNOSIS — E875 Hyperkalemia: Secondary | ICD-10-CM

## 2024-03-24 DIAGNOSIS — I480 Paroxysmal atrial fibrillation: Secondary | ICD-10-CM

## 2024-03-24 DIAGNOSIS — R0789 Other chest pain: Secondary | ICD-10-CM

## 2024-03-24 DIAGNOSIS — K4090 Unilateral inguinal hernia, without obstruction or gangrene, not specified as recurrent: Secondary | ICD-10-CM

## 2024-03-24 NOTE — Addendum Note (Signed)
 Addended by: Daneen Volcy M on: 03/24/2024 04:44 PM   Modules accepted: Orders

## 2024-03-25 ENCOUNTER — Telehealth: Payer: Self-pay | Admitting: Nurse Practitioner

## 2024-03-25 NOTE — Telephone Encounter (Signed)
 Checking percert on the following patient for testing scheduled at [facility].    Lexiscan on Tuesday 03/29/24 at [facility].

## 2024-03-28 ENCOUNTER — Other Ambulatory Visit: Payer: Self-pay | Admitting: Nurse Practitioner

## 2024-03-28 DIAGNOSIS — K4090 Unilateral inguinal hernia, without obstruction or gangrene, not specified as recurrent: Secondary | ICD-10-CM

## 2024-03-29 ENCOUNTER — Ambulatory Visit (HOSPITAL_COMMUNITY): Admission: RE | Admit: 2024-03-29 | Source: Ambulatory Visit

## 2024-03-29 ENCOUNTER — Encounter (HOSPITAL_COMMUNITY): Payer: Self-pay

## 2024-03-29 ENCOUNTER — Encounter (HOSPITAL_COMMUNITY)

## 2024-03-31 ENCOUNTER — Telehealth (HOSPITAL_COMMUNITY): Payer: Self-pay | Admitting: Surgery

## 2024-03-31 ENCOUNTER — Encounter: Payer: Self-pay | Admitting: Nurse Practitioner

## 2024-03-31 ENCOUNTER — Ambulatory Visit: Attending: Nurse Practitioner | Admitting: Nurse Practitioner

## 2024-03-31 ENCOUNTER — Telehealth: Payer: Self-pay | Admitting: Nurse Practitioner

## 2024-03-31 VITALS — BP 136/84 | HR 58 | Ht 68.0 in | Wt 172.8 lb

## 2024-03-31 DIAGNOSIS — I471 Supraventricular tachycardia, unspecified: Secondary | ICD-10-CM | POA: Diagnosis present

## 2024-03-31 DIAGNOSIS — Z01810 Encounter for preprocedural cardiovascular examination: Secondary | ICD-10-CM | POA: Diagnosis present

## 2024-03-31 DIAGNOSIS — I10 Essential (primary) hypertension: Secondary | ICD-10-CM | POA: Insufficient documentation

## 2024-03-31 DIAGNOSIS — I998 Other disorder of circulatory system: Secondary | ICD-10-CM | POA: Diagnosis present

## 2024-03-31 DIAGNOSIS — I25119 Atherosclerotic heart disease of native coronary artery with unspecified angina pectoris: Secondary | ICD-10-CM | POA: Diagnosis present

## 2024-03-31 DIAGNOSIS — R079 Chest pain, unspecified: Secondary | ICD-10-CM | POA: Diagnosis present

## 2024-03-31 DIAGNOSIS — Z85528 Personal history of other malignant neoplasm of kidney: Secondary | ICD-10-CM | POA: Diagnosis present

## 2024-03-31 DIAGNOSIS — Z8679 Personal history of other diseases of the circulatory system: Secondary | ICD-10-CM | POA: Insufficient documentation

## 2024-03-31 DIAGNOSIS — Z8673 Personal history of transient ischemic attack (TIA), and cerebral infarction without residual deficits: Secondary | ICD-10-CM | POA: Insufficient documentation

## 2024-03-31 DIAGNOSIS — Z9889 Other specified postprocedural states: Secondary | ICD-10-CM | POA: Diagnosis present

## 2024-03-31 DIAGNOSIS — R531 Weakness: Secondary | ICD-10-CM | POA: Insufficient documentation

## 2024-03-31 DIAGNOSIS — I4891 Unspecified atrial fibrillation: Secondary | ICD-10-CM | POA: Diagnosis present

## 2024-03-31 DIAGNOSIS — Z952 Presence of prosthetic heart valve: Secondary | ICD-10-CM | POA: Diagnosis present

## 2024-03-31 DIAGNOSIS — R001 Bradycardia, unspecified: Secondary | ICD-10-CM | POA: Insufficient documentation

## 2024-03-31 DIAGNOSIS — E785 Hyperlipidemia, unspecified: Secondary | ICD-10-CM | POA: Diagnosis present

## 2024-03-31 NOTE — Telephone Encounter (Signed)
 Referral faxed to Dr. Merle Starcher, MD @ [facility]   fax # [phone] . Patient has been notified.

## 2024-03-31 NOTE — Telephone Encounter (Signed)
 I called the pt to remind him of his stress test tomorrow, with his arrival time being 8:30.  I went over the following instructions with the patient:  nothing to eat/drink 6 hrs before the test, no smoking 8 hrs before the test, wear comfortable clothes, do not wear lotions/oils, check in at the front desk on arrival, and follow any instructions from your MD about holding medications.  The pt verbalized understanding.

## 2024-03-31 NOTE — Progress Notes (Addendum)
 Cardiology Office Note:   Date:  03/31/2024 ID:  Rimas, Gilham 10/08/1956, MRN [id number] PCP:  Fredick Jarred, PA-C [facility] Providers Cardiologist:  Teddie Favre, MD    Referring MD: Fredick Jarred, PA-C  CC: Here for follow-up  History of Present Illness:    RANDEE UPCHURCH is a very delightful 68 y.o. male with a PMH of severe AS due to bicuspid aortic valve, s/p AVR and ascending aortic aneurysm repair on May 18, 2023, CAD, s/p NSTEMI in 2015, HTN, PSVT, HLD, hx of RCC, s/p partial right nephrectomy in 2013, who presents today for follow-up.    Had an NSTEMI in 2015, underwent cardiac cath that showed occlusion of RCA filled by collaterals and had unsuccessful PCI, medical management recommended.  History of renal cell carcinoma, s/p partial nephrectomy in 2013.   Intraoperative TEE with EF approximately 50%.  Later in the evening on July 3 was noted to have right hand weakness and rapid response team was called.  Noted to have A-fib with RVR was hypotensive.  Was started on amiodarone  infusion, cardiology was consulted.  Transition to amiodarone  200 mg twice daily.  CT of the head without evidence of acute infarct or hemorrhage, however MRI of the brain showed scattered small acute infarcts in left cerebellar hemisphere in both cerebral hemispheres that were likely embolic in etiology.  Neurology was consulted and was felt to be candidate for Coumadin  due to new aortic valve as well as postoperative A-fib.  No DVT was noted.  06/19/2023 - Today he presents for post AVR follow-up.  He states that he is doing well since leaving the hospital. Participating in home health therapy. Does have a deficit, particularly right upper extremity weakness since the stroke, states right leg weakness has been occurring for 2 years since before his stroke.  Has been having some issues with constipation and appetite.  Weight has been stable recently. Denies any chest pain, shortness of breath,  palpitations, syncope, presyncope, dizziness, orthopnea, PND, swelling or significant weight changes, acute bleeding, or claudication.  09/07/2023 - Doing well.  He is interested in consulting a neurosurgeon based on his recent symptoms. Continuing to work with therapy, still experiencing right sided weakness, but right side grip is improving per his report. Denies any chest pain, shortness of breath, palpitations, syncope, presyncope, dizziness, orthopnea, PND, swelling or significant weight changes, acute bleeding, or claudication.   02/09/2024 - Since I have last seen him, he has been following [facility]. Underwent peripheral vascular cath on 01/27/2024 due to bilateral lower extremity critical and ischemic loss of the toes. Findings revealed sluggish flow in bilateral lower extremities, etiology most likely included heart failure, small vessel disease and foot, vasospasm.  It was suggested that he would benefit from a vasodilator.  Was started on nifedipine , however could not tolerate as he had a questionable syncopal episode on this medicine.  Patient is not 100% sure that he had a syncopal episode.  Says he was sitting in a chair and felt funny surrounding episode.  He says his wife witnessed him passing out though.  No longer taking nifedipine  per his report. Denies any chest pain, shortness of breath, palpitations, syncope, orthopnea, PND, swelling or significant weight changes, acute bleeding, or claudication. Does admit to some dizziness at times.   03/11/2024 - Presents today for follow-up. Denies any recurrent dizziness or near syncopal/syncopal episodes. Says this has resolved after cutting back on caffeine intake. Says his legs look better in appearance, has been  getting Botox injections, does admit to leg tingling at times. Admits to burning sensation "like after a workout" along mid to right side of chest and also right shoulder, says this has been going on ever since his open heart surgery, says it is  intermittent and notices it now. Says he is wondering if he is overdoing it, says he does a lot of household work. However, he avoids heavy lifting because of his hernia. Wants to know when he can have surgery. Denies any shortness of breath, palpitations, syncope, presyncope, dizziness, orthopnea, PND, swelling or significant weight changes, or claudication. Admits to some scattered bruising.   03/31/2024 - Tells me he was unable to do the NST on Tuesday due to water outage that day. Says he continues to note burning in his chest, not as often as before. Wonders if this is possibly related to GERD or if it is coming from his heart. Does note occasional back spasms he believes is due to his mattress. Recently has been sleeping on couch instead of his bed and says his back has been feeling much better.  He is scheduled to see a doctor to evaluate his spine and back soon. Still very active. Does experience some cold sensations that he wonders is due to a circulation issue. Denies any shortness of breath, palpitations, syncope, presyncope, dizziness, orthopnea, PND, swelling or significant weight changes, acute bleeding, or claudication.   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   ROS:   Please see the history of present illness.    All other systems reviewed and are negative.  EKGs/Labs/Other Studies Reviewed:    The following studies were reviewed today:  EKG: EKG is not ordered today.      Cardiac monitor 02/2024:  ZIO AT reviewed.  13 days, 22 hours analyzed.   Predominant rhythm is sinus with heart rate ranging from 48 bpm up to 126 bpm and average heart rate 69 bpm. There were rare PACs including atrial couplets and triplets representing less than 1% total beats. There were occasional PVCs representing 4.1% total beats with otherwise rare ventricular couplets and triplets representing less than 1% total beats.  Also limited episodes of ventricular bigeminy and trigeminy.   There were 6 runs of NSVT, the longest of which lasted for 12.9 seconds (cannot exclude aberrantly conducted SVT). Multiple (39) episodes of PSVT were noted, the longest of which lasted for 24.8 seconds. No pauses or high degree heart block.  Abdominal aortogram 01/27/2024:  Impression: Sluggish flow in bilateral lower extremities.  Etiologies include heart failure, small vessel disease in the foot, vasospasm. Jayron would benefit from new echo to quantify his ejection fraction.  He would also benefit from a vasodilator.  He was started on nifedipine  last week, but had a syncopal episode yesterday.  Per Andy Bannister, the syncopal episode occurred as he was discussing hernia surgery, which made him feel queasy.  He has had no other issues since starting the medication last Friday.   I plan to send this to Dr. Londa Rival and Lasalle Pointer, NP, whom he sees for cardiology and his Primary Fredick Jarred NP.   I think Ernest would benefit from medication to help with lower extremity vasospasm as long as his Echo is okay. Due to blood pressure concerns, will defer further management to either his primary or cardiology.   First-line therapy is reducing exposure to cold, and eliminating ergot's such as caffeine which can cause vasospasm. From a medication standpoint,  nifedipine , which is a calcium  channel blocker, which can increase vasodilation in the fingers and toes. This can be prescribed in an extended release tablet.  I prescribed nifedipine , which he stated helped quite a bit.  He is currently off of this after the syncopal episode.  Can try a lower dose.   Should nifedipine  not work, amlodipine  has also proven to have some efficacy. Phosphodiesterase 5 inhibitors are second line therapy - Sildenafil, tadalafil, or vardenafil.   Should wounds worsen, the only therapy I could offer would be amputation.  I would defer this to podiatry at [facility].  ABI's 01/2024:  Summary:  Right: Resting right  ankle-brachial index is within normal range.   Flatline PPG waveforms of the 1st-3rd digits with severely dampended  4th-5th digits.   Left: Resting left ankle-brachial index is within normal range.   Flatline PPG waveforms of the 1st-3rd digits with severely dampended  4th-5th digits.  Limited echo 08/26/2023:   1. Left ventricular ejection fraction, by estimation, is 55 to 60%. The  left ventricle has normal function. The left ventricle has no regional  wall motion abnormalities. There is mild left ventricular hypertrophy.  Left ventricular diastolic parameters  are consistent with Grade I diastolic dysfunction (impaired relaxation).  The average left ventricular global longitudinal strain is -18.2 %. The  global longitudinal strain is normal.   2. Right ventricular systolic function is mildly reduced. The right  ventricular size is mildly enlarged. There is normal pulmonary artery  systolic pressure.   3. There is a 23 mm Inspiris Resilia + replacement of ascending aorta  with Hemashield platinum graft valve present in the aortic position.   4. The inferior vena cava is normal in size with greater than 50%  respiratory variability, suggesting right atrial pressure of 3 mmHg.   5. Limited echo evaluate LV function, evaluate pericardial effusion   6. No pericardial effusion.   Comparison(s): EF 45-50%. Small to moderate posterior pericardial  effusion. No evidence of cardiac tamponade.  Echo 06/2023:  1. Left ventricular ejection fraction, by estimation, is 45 to 50%. The  left ventricle has mildly decreased function. The left ventricle  demonstrates regional wall motion abnormalities (see scoring  diagram/findings for description). There is mild  concentric left ventricular hypertrophy. Left ventricular diastolic  parameters are consistent with Grade I diastolic dysfunction (impaired  relaxation).   2. Right ventricular systolic function is moderately reduced. The right   ventricular size is normal. There is normal pulmonary artery systolic  pressure. The estimated right ventricular systolic pressure is 25.7 mmHg.   3. A small to moderate pericardial effusion is present. The pericardial  effusion is posterior to the left ventricle. There is no evidence of  cardiac tamponade.   4. The mitral valve is grossly normal. Mild mitral valve regurgitation.   5. The aortic valve has been repaired/replaced. Aortic valve  regurgitation is not visualized. There is a 23 mm Inspiris Resilia +  replacement of ascending aorta with Hemashield platinum graft valve  present in the aortic position. Procedure Date: 05/18/23.   Aortic valve mean gradient measures 9.0 mmHg.   6. The inferior vena cava is normal in size with greater than 50%  respiratory variability, suggesting right atrial pressure of 3 mmHg.   Comparison(s): Prior images reviewed side by side. LVEF mildly reduced in range of 45-50%. Bioprosthetic AVR in place with no aortic regurgitation and normal mean AV gradient of 9 mmHg. Small to moderate posterior pericardial effusion.  CT angio chest 12/2022: IMPRESSION: 1. Ascending thoracic aortic aneurysm measuring 53 x 50 mm in diameter. Recommend semi-annual imaging followup by CTA or MRA and referral to cardiothoracic surgery if not already obtained. This recommendation follows 2010 ACCF/AHA/AATS/ACR/ASA/SCA/SCAI/SIR/STS/SVM Guidelines for the Diagnosis and Management of Patients With Thoracic Aortic Disease. Circulation. 2010; 121: E454-U981. Aortic aneurysm NOS (ICD10-I71.9) 2. Thickening and calcification of the aortic valve. 3. Coronary artery and Aortic Atherosclerosis (ICD10-I70.0).  Echocardiogram on 11/14/2022:  1. Left ventricular ejection fraction, by estimation, is 55 to 60%. The  left ventricle has normal function. The left ventricle has no regional  wall motion abnormalities. There is mild left ventricular hypertrophy.  Left ventricular diastolic  parameters  are consistent with Grade I diastolic dysfunction (impaired relaxation).  The average left ventricular global longitudinal strain is -19.0 %. The  global longitudinal strain is normal.   2. Right ventricular systolic function is normal. The right ventricular  size is normal. Tricuspid regurgitation signal is inadequate for assessing  PA pressure.   3. The mitral valve is normal in structure. Trivial mitral valve  regurgitation. No evidence of mitral stenosis.   4. The tricuspid valve is abnormal.   5. The aortic valve has an indeterminant number of cusps. Aortic valve  regurgitation is moderate. Severe aortic valve stenosis.   6. Limited visualization of the ascending aorta, the visualized parts are  at least moderately dilated. Consider additional imaging with CTA or MRA  to better evalute degree and extent of aortic aneurysm. Aaron Aas Aortic  dilatation noted. There is mild  dilatation of the aortic root, measuring 42 mm. There is moderate  dilatation of the ascending aorta, measuring 48 mm.   7. The inferior vena cava is normal in size with greater than 50%  respiratory variability, suggesting right atrial pressure of 3 mmHg.  Physical Exam:    VS:  BP 136/84   Pulse (!) 58   Ht 5\' 8"  (1.727 m)   Wt 172 lb 12.8 oz (78.4 kg)   SpO2 99%   BMI 26.27 kg/m     Wt Readings from Last 3 Encounters:  03/31/24 172 lb 12.8 oz (78.4 kg)  03/11/24 171 lb (77.6 kg)  03/10/24 171 lb (77.6 kg)    GEN: Well nourished, well developed in no acute distress HEENT: Normal NECK: No JVD; No carotid bruits CARDIAC: S1/S2, RRR, no murmur, no rubs or gallops noted; 2+ peripheral pulses along upper extremities RESPIRATORY:  Clear to auscultation without rales, wheezing or rhonchi  MUSCULOSKELETAL:  No edema; No deformity  SKIN: Warm and dry, improved color of skin to BLE, +2 PT pulses NEUROLOGIC:  Alert and oriented x 3 PSYCHIATRIC:  Calm and pleasant  ASSESSMENT & PLAN:    In order of  problems listed above:  1. Severe aortic stenosis, bicuspid aortic valve, s/p SAVR TTE 08/2023 showed normal AVR. No medication changes at this time. Discussed SBE prophylaxis. Continue to follow-up at the Coumadin  Clinic.   2. CAD, status post NSTEMI in 2015, chest pain of uncertain etiology Admits to atypical symptoms.  Due to high RCRI risk and current symptoms, have previously arranged NST and will get this performed ASAP for him. Already previously discussed risk versus benefits and he verbalized understanding and was agreeable to proceed.  Continue aspirin , Lipitor , and Lopressor . Heart healthy diet and regular physical activity as tolerated encouraged.  ED precautions discussed.   3. Thoracic aortic aneurysm, s/p repair CTA of chest revealed ascending thoracic aortic aneurysm measuring 53 x 50  mm. Underwent ascending aortic aneurysm repair on May 18, 2023 along with SAVR. Doing well. Denies any concerning symptoms. Follow-up with cardiothoracic surgery as scheduled. Care and ED precautions discussed. Heart healthy diet encouraged.   4. Hyperlipidemia Past LDL 75. Continue atorvastatin . Heart healthy diet encouraged.   5. Hypertension Blood pressure today stable. Continue Lopressor .  Heart healthy diet encouraged. Care and ED precautions discussed.   6. PSVT,  bradycardia, post-op A-fib Denies any palpitations.  Heart rate stable today, asymptomatic.  See recent monitor report above. Continue Lopressor . Heart healthy diet encouraged.   7. History of RCC, status post partial right nephrectomy in 2013 Denies any issues.  He follows up with nephrology annually.  Continue current medication regimen.  Continue to follow-up with nephrology.    8. Hx of CVA, Right side weakness Previous MRI of the brain showed scattered small acute infarcts in left cerebellar hemisphere in both cerebral hemispheres that were likely embolic in etiology.  Neurology was consulted and was felt to be candidate for  Coumadin  due to new aortic valve as well as postoperative A-fib. Noted to have right upper extremity weakness after stroke, improving, right lower extremity weakness was there prior to stroke. Continue to follow with PCP.  9. Vasospastic limb ischemia No critical wounds noted to either lower extremity. Circulation to lower extremities appears improved since last office visit after lifestyle changes. I have previously consulted with Dr. Christia Cowboy, [facility] regarding medical therapy.  Discussed that first-line therapy is reducing exposure to cold, eliminating triggers such as caffeine.  If no improvement by next follow-up visit, plan to consider low-dose amlodipine .  Follow-up with [facility] and podiatry as scheduled. Care and ED precautions discussed.   10. Pre-operative cardiovascular risk assessment Mr. Tool's perioperative risk of a major cardiac event is 11% according to the Revised Cardiac Risk Index (RCRI).  Therefore, he is at high risk for perioperative complications.   His functional capacity is fair at 4.64 METs according to the Duke Activity Status Index (DASI). Recommendations: According to ACC/AHA guidelines, no further cardiovascular testing needed.  The patient may proceed to surgery at acceptable risk.   However due to recent symptoms, will proceed with NST. Antiplatelet and/or Anticoagulation Recommendations: Will route note to clinical pharmacy regarding holding Coumadin .    Addendum: Recent Myoview  was low risk, did show a large region of inferior scar but only mild peri-infarct ischemia.  I have already consulted Dr. Londa Rival who stated that he doubts that cardiothoracic surgery would have any specific concerns at this time.  I have already consulted Dr. Rosalva Comber with [facility] who has no hesitation for patient to have surgery. Dr. Londa Rival stated main concern would be holding time for Coumadin . Per pharmacy recommendations, they have stated, "Patient has not had an Afib/aflutter ablation within the last  3 months or DCCV within the last 30 days. Per office protocol, patient can hold warfarin for 5 days prior to procedure.  Patient WILL need bridging with Lovenox  (enoxaparin ) around procedure. Next INR appointment June 2 at [facility]  clinic.  RN aware to bridge once date confirmed." Recommend clearance from cardiothoracic surgery at this time.   12. Disposition: Care and ED precautions discussed.  Follow-up with Dr. Londa Rival or APP in 3-4 months or sooner if anything changes.   Medication Adjustments/Labs and Tests Ordered: Current medicines are reviewed at length with the patient today.  Concerns regarding medicines are outlined above.  No orders of the defined types were placed in this encounter.  No orders of the  defined types were placed in this encounter.   Patient Instructions  Medication Instructions:  Your physician recommends that you continue on your current medications as directed. Please refer to the Current Medication list given to you today.  Labwork: None   Testing/Procedures: Your physician has requested that you have a lexiscan  myoview . For further information please visit [email or web address] Please follow instruction sheet, as given.  Follow-Up: Your physician recommends that you schedule a follow-up appointment in: 3-4 months   Any Other Special Instructions Will Be Listed Below (If Applicable).  If you need a refill on your cardiac medications before your next appointment, please call your pharmacy.    Signed, Lasalle Pointer, NP

## 2024-03-31 NOTE — Patient Instructions (Signed)
 Medication Instructions:  Your physician recommends that you continue on your current medications as directed. Please refer to the Current Medication list given to you today.  Labwork: None   Testing/Procedures: Your physician has requested that you have a lexiscan myoview. For further information please visit [email or web address] Please follow instruction sheet, as given.  Follow-Up: Your physician recommends that you schedule a follow-up appointment in: 3-4 months   Any Other Special Instructions Will Be Listed Below (If Applicable).  If you need a refill on your cardiac medications before your next appointment, please call your pharmacy.

## 2024-04-01 ENCOUNTER — Ambulatory Visit (HOSPITAL_COMMUNITY)
Admission: RE | Admit: 2024-04-01 | Discharge: 2024-04-01 | Disposition: A | Discharge status: Home / Self Care | Source: Ambulatory Visit | Attending: Cardiology | Admitting: Cardiology

## 2024-04-01 ENCOUNTER — Ambulatory Visit (HOSPITAL_BASED_OUTPATIENT_CLINIC_OR_DEPARTMENT_OTHER)
Admission: RE | Admit: 2024-04-01 | Discharge: 2024-04-01 | Disposition: A | Discharge status: Home / Self Care | Source: Ambulatory Visit | Attending: Nurse Practitioner | Admitting: Nurse Practitioner

## 2024-04-01 ENCOUNTER — Encounter (HOSPITAL_COMMUNITY): Payer: Self-pay

## 2024-04-01 DIAGNOSIS — R0789 Other chest pain: Secondary | ICD-10-CM | POA: Diagnosis present

## 2024-04-01 LAB — NM MYOCAR MULTI W/SPECT W/WALL MOTION / EF
Estimated workload: 1
Exercise duration (min): 0 min
Exercise duration (sec): 0 s
LV dias vol: 86 mL (ref 62–150)
LV sys vol: 37 mL
MPHR: 152 {beats}/min
Nuc Stress EF: 57 %
Peak HR: 79 {beats}/min
Percent HR: 51 %
RATE: 0.3
Rest HR: 53 {beats}/min
Rest Nuclear Isotope Dose: 11 mCi
SDS: 4
SRS: 8
SSS: 12
ST Depression (mm): 0 mm
Stress Nuclear Isotope Dose: 33 mCi
TID: 1.11

## 2024-04-01 MED ORDER — REGADENOSON 0.4 MG/5ML IV SOLN
INTRAVENOUS | Status: AC
Start: 2024-04-01 — End: 2024-04-01
  Administered 2024-04-01: 0.4 mg via INTRAVENOUS
  Filled 2024-04-01: qty 5

## 2024-04-01 MED ORDER — TECHNETIUM TC 99M TETROFOSMIN IV KIT
30.0000 | PACK | Freq: Once | INTRAVENOUS | Status: AC | PRN
  Administered 2024-04-01: 33 via INTRAVENOUS

## 2024-04-01 MED ORDER — TECHNETIUM TC 99M TETROFOSMIN IV KIT
10.0000 | PACK | Freq: Once | INTRAVENOUS | Status: AC | PRN
  Administered 2024-04-01: 11 via INTRAVENOUS

## 2024-04-01 MED ORDER — SODIUM CHLORIDE FLUSH 0.9 % IV SOLN
INTRAVENOUS | Status: AC
Start: 2024-04-01 — End: 2024-04-01
  Administered 2024-04-01: 10 mL via INTRAVENOUS
  Filled 2024-04-01: qty 10

## 2024-04-05 ENCOUNTER — Ambulatory Visit: Payer: Self-pay | Admitting: Nurse Practitioner

## 2024-04-06 ENCOUNTER — Telehealth: Payer: Self-pay | Admitting: Nurse Practitioner

## 2024-04-06 ENCOUNTER — Encounter: Payer: Self-pay | Admitting: Nurse Practitioner

## 2024-04-06 ENCOUNTER — Telehealth: Payer: Self-pay | Admitting: Pharmacist Clinician (PhC)/ Clinical Pharmacy Specialist

## 2024-04-06 NOTE — Telephone Encounter (Signed)
 Per Elenore Griffon Update about [name]. I have spoken to Sanjuana Crutch who is a Teacher, music in [place] and they will have to do preop clearance with their team as he is a very high case and has a high risk for major cardiac event surrounding surgery. I am still waiting on response from his primary cardiologist as well as clearance from vascular surgery and his cardiothoracic surgeon. Because pre-op will be doing his clearance instead, I recommend we send referral in, but patient still needs to wait until I get the okay from his primary cardiologist and gets clearance from cardiothoracic surgery and vascular surgery. Thanks!

## 2024-04-06 NOTE — Telephone Encounter (Signed)
 Patient with diagnosis of atrial fibrillation on warfarin for anticoagulation.    Procedure: hernia repair Date of procedure: TBD   CHA2DS2-VASc Score = 5   This indicates a 7.2% annual risk of stroke. The patient's score is based upon: CHF History: 0 HTN History: 1 Diabetes History: 0 Stroke History: 2 Vascular Disease History: 1 Age Score: 1 Gender Score: 0    CrCl 76 Platelet count 194  Patient has not had an Afib/aflutter ablation within the last 3 months or DCCV within the last 30 days  Per office protocol, patient can hold warfarin for 5 days prior to procedure.   Patient WILL need bridging with Lovenox  (enoxaparin ) around procedure.  Next INR appointment June 2 at [facility]  clinic.  RN aware to bridge once date comfirmed.    **This guidance is not considered finalized until pre-operative APP has relayed final recommendations.**

## 2024-04-06 NOTE — Telephone Encounter (Signed)
 Patient informed and verbalized understanding of plan.

## 2024-04-06 NOTE — Telephone Encounter (Signed)
-----   Message from Lasalle Pointer sent at 04/06/2024  9:14 AM EDT ----- Thank you for following up and assisting this patient. I am now waiting on clinical pharmacy to let me know how long he is to hold Coumadin  and I am waiting on clearance from his cardiothoracic surgeon as well as his vascular surgeon.   Lasalle Pointer, NP

## 2024-04-13 ENCOUNTER — Telehealth: Payer: Self-pay

## 2024-04-13 NOTE — Telephone Encounter (Signed)
-----   Message from Leala Prince sent at 04/13/2024  9:36 AM EDT ----- Regarding: clearance form Please see below. Per Larinda Plover, need official clearance form.  Thanks!  Leala Prince, PA-C ----- Message ----- From: Sunny English, [facility] Sent: 04/06/2024   2:01 PM EDT To: [facility]  Can we get an official clearance form? ----- Message ----- From: Lasalle Pointer, NP Sent: 04/06/2024   9:12 AM EDT To: [facility]  Please comment about holding time for coumadin .   Thanks!   Best, Lasalle Pointer, NP

## 2024-04-13 NOTE — Telephone Encounter (Signed)
 Sent message requesting formal Preop clearance from Irvin Mantel, MD and Linder Revere, MD office's.

## 2024-04-17 ENCOUNTER — Other Ambulatory Visit: Payer: Self-pay | Admitting: Vascular Surgery

## 2024-04-18 ENCOUNTER — Ambulatory Visit: Attending: Cardiology | Admitting: *Deleted

## 2024-04-18 DIAGNOSIS — Z952 Presence of prosthetic heart valve: Secondary | ICD-10-CM

## 2024-04-18 DIAGNOSIS — I480 Paroxysmal atrial fibrillation: Secondary | ICD-10-CM

## 2024-04-18 DIAGNOSIS — I639 Cerebral infarction, unspecified: Secondary | ICD-10-CM | POA: Diagnosis present

## 2024-04-18 DIAGNOSIS — Z5181 Encounter for therapeutic drug level monitoring: Secondary | ICD-10-CM

## 2024-04-18 LAB — POCT INR: INR: 3.3 — AB (ref 2.0–3.0)

## 2024-04-18 NOTE — Patient Instructions (Signed)
 Take warfarin 1 tablet tonight then decrease dose to 2 tablets  Recheck in 4 weeks Pending hernia surgery.  Not scheduled yet.

## 2024-04-19 ENCOUNTER — Encounter: Payer: Self-pay | Admitting: Physical Medicine & Rehabilitation

## 2024-04-21 ENCOUNTER — Encounter: Admitting: Physical Medicine & Rehabilitation

## 2024-05-01 ENCOUNTER — Other Ambulatory Visit: Payer: Self-pay | Admitting: Cardiology

## 2024-05-16 ENCOUNTER — Ambulatory Visit

## 2024-05-17 ENCOUNTER — Encounter: Payer: Self-pay | Admitting: Podiatry

## 2024-05-17 ENCOUNTER — Ambulatory Visit (INDEPENDENT_AMBULATORY_CARE_PROVIDER_SITE_OTHER): Admitting: Podiatry

## 2024-05-17 ENCOUNTER — Other Ambulatory Visit: Payer: Self-pay | Admitting: Nurse Practitioner

## 2024-05-17 DIAGNOSIS — I739 Peripheral vascular disease, unspecified: Secondary | ICD-10-CM

## 2024-05-17 DIAGNOSIS — B351 Tinea unguium: Secondary | ICD-10-CM

## 2024-05-17 DIAGNOSIS — Z7901 Long term (current) use of anticoagulants: Secondary | ICD-10-CM | POA: Diagnosis not present

## 2024-05-17 DIAGNOSIS — M79675 Pain in left toe(s): Secondary | ICD-10-CM

## 2024-05-17 DIAGNOSIS — M79674 Pain in right toe(s): Secondary | ICD-10-CM | POA: Diagnosis not present

## 2024-05-17 NOTE — Progress Notes (Signed)
       Subjective:  Patient ID: [name], male    DOB: 04/22/1956,  MRN: [id number]  [name] presents to clinic today for:  Chief Complaint  Patient presents with   [facility]    RFC non diabetic toe nail trim.    Patient notes nails are thick and elongated, causing pain in shoe gear when ambulating.    PCP is Dow Longs, PA-C.  Last seen around 12/09/23  Past Medical History:  Diagnosis Date   Arthritis    Coronary atherosclerosis of native coronary artery    a. NSTEMI (02/2014):  LHC (02/2014):  Mild disease in LAD and CFX; prox RCA occluded with R-R collats, dist RCA filled by L-R collats, inf HK, EF 55%, LVEDP 15 mmHg.  PCI:  Unsuccessful angioplasty of RCA (late presentation of inf MI) - tx medically.   Essential hypertension    GERD (gastroesophageal reflux disease)    Hyperlipidemia    NSTEMI (non-ST elevated myocardial infarction) (HCC) 03/11/14   Renal cell carcinoma of right kidney (HCC)    Partial nephrectomy in 2013    Allergies  Allergen Reactions   Alcohol Other (See Comments)    Pts skin gets red. Ex. Pt used alcohol based deodorant and got red things under arms. & Drinks alcohol notices from heart on up everything turns red.   Chlorthalidone      Dizziness and Syncopal Episode    Objective:  [name] is a pleasant 68 y.o. male in NAD. AAO x 3.  Vascular Examination: Patient has palpable DP pulse, absent PT pulse bilateral.  Delayed capillary refill bilateral toes.  Sparse digital hair bilateral.  Proximal to distal cooling WNL bilateral.    Dermatological Examination: Interspaces are clear with no open lesions noted bilateral.  Skin is shiny and atrophic bilateral.  Nails are 3-25mm thick, with yellowish/brown discoloration, subungual debris and distal onycholysis x10.  There is pain with compression of nails x10.    Patient qualifies for at-risk foot care because of PVD.  Assessment/Plan: 1. Pain due to onychomycosis of toenails of both feet    2. PVD (peripheral vascular disease) (HCC)   3. Long term current use of anticoagulant     Mycotic nails x10 were sharply debrided with sterile nail nippers and power debriding burr to decrease bulk and length.   Return in about 3 months (around 08/17/2024) for RFC.   Awanda CHARM Imperial, DPM, FACFAS [facility]     [address] [place], [place] [address]                Office [phone]  Fax [phone]

## 2024-05-18 ENCOUNTER — Ambulatory Visit: Attending: Cardiology | Admitting: *Deleted

## 2024-05-18 DIAGNOSIS — Z952 Presence of prosthetic heart valve: Secondary | ICD-10-CM | POA: Diagnosis present

## 2024-05-18 DIAGNOSIS — I639 Cerebral infarction, unspecified: Secondary | ICD-10-CM | POA: Insufficient documentation

## 2024-05-18 DIAGNOSIS — I480 Paroxysmal atrial fibrillation: Secondary | ICD-10-CM | POA: Diagnosis present

## 2024-05-18 DIAGNOSIS — Z5181 Encounter for therapeutic drug level monitoring: Secondary | ICD-10-CM | POA: Insufficient documentation

## 2024-05-18 LAB — POCT INR: INR: 3.6 — AB (ref 2.0–3.0)

## 2024-05-18 NOTE — Patient Instructions (Signed)
 Hold warfarin tonight then decrease dose to 2 tablets daily except 1 tablet on Saturdays Recheck in 3 weeks Pending hernia surgery.  Not scheduled yet.

## 2024-05-18 NOTE — Progress Notes (Signed)
Please see anticoagulation encounter.

## 2024-06-07 ENCOUNTER — Telehealth: Payer: Self-pay | Admitting: Nurse Practitioner

## 2024-06-07 ENCOUNTER — Ambulatory Visit: Attending: Cardiology | Admitting: *Deleted

## 2024-06-07 DIAGNOSIS — I480 Paroxysmal atrial fibrillation: Secondary | ICD-10-CM | POA: Insufficient documentation

## 2024-06-07 DIAGNOSIS — Z952 Presence of prosthetic heart valve: Secondary | ICD-10-CM | POA: Diagnosis present

## 2024-06-07 DIAGNOSIS — I639 Cerebral infarction, unspecified: Secondary | ICD-10-CM | POA: Insufficient documentation

## 2024-06-07 DIAGNOSIS — Z5181 Encounter for therapeutic drug level monitoring: Secondary | ICD-10-CM | POA: Insufficient documentation

## 2024-06-07 LAB — POCT INR: INR: 1.9 — AB (ref 2.0–3.0)

## 2024-06-07 MED ORDER — AMOXICILLIN 500 MG PO CAPS: 2000.0000 mg | ORAL_CAPSULE | ORAL | 5 refills | Status: AC

## 2024-06-07 NOTE — Telephone Encounter (Signed)
 Pt's medication was sent to pt's pharmacy as requested. Confirmation received.

## 2024-06-07 NOTE — Patient Instructions (Addendum)
 Increase warfarin to 2 tablets daily  Recheck in 2 weeks Pending hernia surgery on 8/12 at [facility].  Will hold warfarin 5 days before procedure and Bridge with Lovenox .  Pt has been cleared for surgery per Cardiology.

## 2024-06-07 NOTE — Progress Notes (Signed)
Please see anticoagulation encounter.

## 2024-06-07 NOTE — Telephone Encounter (Signed)
 1. Which medications need to be refilled? (please list name of each medication and dose if known) Amoxicillin   2. Which pharmacy/location (including street and city if local pharmacy) is medication to be sent to?[facility]  3. Do they need a 30 day or 90 day supply? Needs to have for dental work

## 2024-06-22 ENCOUNTER — Ambulatory Visit: Attending: Cardiology | Admitting: *Deleted

## 2024-06-22 ENCOUNTER — Telehealth: Payer: Self-pay | Admitting: Nurse Practitioner

## 2024-06-22 ENCOUNTER — Encounter: Payer: Self-pay | Admitting: *Deleted

## 2024-06-22 DIAGNOSIS — I480 Paroxysmal atrial fibrillation: Secondary | ICD-10-CM | POA: Diagnosis present

## 2024-06-22 DIAGNOSIS — Z952 Presence of prosthetic heart valve: Secondary | ICD-10-CM | POA: Insufficient documentation

## 2024-06-22 DIAGNOSIS — I639 Cerebral infarction, unspecified: Secondary | ICD-10-CM | POA: Insufficient documentation

## 2024-06-22 DIAGNOSIS — Z5181 Encounter for therapeutic drug level monitoring: Secondary | ICD-10-CM | POA: Insufficient documentation

## 2024-06-22 LAB — POCT INR: INR: 2.3 (ref 2.0–3.0)

## 2024-06-22 MED ORDER — ENOXAPARIN SODIUM 120 MG/0.8ML IJ SOSY: 120.0000 mg | PREFILLED_SYRINGE | INTRAMUSCULAR | 0 refills | Status: DC

## 2024-06-22 NOTE — Telephone Encounter (Signed)
 Pt stated that he has a scheduled surgery and that elizabeth had put a note in about surgical clearance. Pt wants to make sure that Almarie and Debera have signed off on him having surgery

## 2024-06-22 NOTE — Patient Instructions (Addendum)
 06/28/24  Hernia Repair  06/21/24  Labs:  Scr 0.95  CrCl 82.53  Hgb 16  Hct 48.4  Plts 174  Wt 78.4kg  Lovenox  120mg  sq daily at 7am  #10  Sent to [facility]  [facility]  8/6  Last dose of warfarin  (5mg ) 8/7  No Lovenox  or warfarin 8/8 - 8/11  Lovenox  120mg  sq at 7am 8/12  NO lovenox  ---------surgery----------Warfarin 7.5mg  in pm 8/13  Lovenox  120mg  sq at 7am and warfarin 7.5mg  in pm 8/14 - 8/17  Lovenox  120mg  sq at 7am and warfarin 5mg  in pm 8/18  Lovenox  120mg  sq at 7am and appt at 11:15am

## 2024-06-22 NOTE — Progress Notes (Signed)
 INR 2.3. Please see anticoagulation encounter

## 2024-06-23 NOTE — Telephone Encounter (Signed)
 Per Elizabeth's response:   addressed this in my note dated Mar 31, 2024. It is at the bottom of the note. Already have spoken to Dr. Debera regarding his case as well his vascular surgeon. I have faxed this note over to the surgeon who should have my note. I have also provided recommendations outlined in this note. If anyone has any questions, they can contact me and I would be happy to assist.    Thanks!    Best, Almarie Crate, NP  Patient informed and no further concerns

## 2024-06-27 NOTE — Progress Notes (Signed)
 This encounter was created in error - please disregard.

## 2024-07-04 ENCOUNTER — Ambulatory Visit: Attending: Cardiology | Admitting: *Deleted

## 2024-07-04 DIAGNOSIS — Z5181 Encounter for therapeutic drug level monitoring: Secondary | ICD-10-CM | POA: Insufficient documentation

## 2024-07-04 DIAGNOSIS — I480 Paroxysmal atrial fibrillation: Secondary | ICD-10-CM | POA: Insufficient documentation

## 2024-07-04 DIAGNOSIS — I639 Cerebral infarction, unspecified: Secondary | ICD-10-CM | POA: Diagnosis present

## 2024-07-04 DIAGNOSIS — Z952 Presence of prosthetic heart valve: Secondary | ICD-10-CM | POA: Insufficient documentation

## 2024-07-04 LAB — POCT INR: INR: 1.4 — AB (ref 2.0–3.0)

## 2024-07-04 NOTE — Progress Notes (Signed)
 INR 1.4. Please see anticoagulation encounter

## 2024-07-04 NOTE — Patient Instructions (Signed)
 Take warfarin 4 tablets tonight and 3 tablets on Tuesday. Continue Lovenox  injection tomorrow Recheck INR on Wednesday

## 2024-07-06 ENCOUNTER — Ambulatory Visit: Attending: Cardiology | Admitting: *Deleted

## 2024-07-06 DIAGNOSIS — I639 Cerebral infarction, unspecified: Secondary | ICD-10-CM | POA: Insufficient documentation

## 2024-07-06 DIAGNOSIS — I480 Paroxysmal atrial fibrillation: Secondary | ICD-10-CM | POA: Diagnosis present

## 2024-07-06 DIAGNOSIS — Z952 Presence of prosthetic heart valve: Secondary | ICD-10-CM | POA: Diagnosis present

## 2024-07-06 DIAGNOSIS — Z5181 Encounter for therapeutic drug level monitoring: Secondary | ICD-10-CM | POA: Diagnosis present

## 2024-07-06 LAB — POCT INR: INR: 2.8 (ref 2.0–3.0)

## 2024-07-06 NOTE — Patient Instructions (Signed)
 Restart warfarin 2 tablets daily Stop Lovenox  Recheck INR in 3 wk

## 2024-07-06 NOTE — Progress Notes (Signed)
 INR-2.8 Please see anticoagulation encounter

## 2024-07-07 ENCOUNTER — Encounter: Payer: Self-pay | Admitting: Nurse Practitioner

## 2024-07-07 ENCOUNTER — Ambulatory Visit: Attending: Nurse Practitioner | Admitting: Nurse Practitioner

## 2024-07-07 VITALS — BP 128/68 | HR 66 | Ht 68.0 in | Wt 169.4 lb

## 2024-07-07 DIAGNOSIS — I10 Essential (primary) hypertension: Secondary | ICD-10-CM | POA: Diagnosis not present

## 2024-07-07 DIAGNOSIS — I998 Other disorder of circulatory system: Secondary | ICD-10-CM | POA: Insufficient documentation

## 2024-07-07 DIAGNOSIS — Z9889 Other specified postprocedural states: Secondary | ICD-10-CM | POA: Diagnosis not present

## 2024-07-07 DIAGNOSIS — R531 Weakness: Secondary | ICD-10-CM | POA: Diagnosis present

## 2024-07-07 DIAGNOSIS — Z952 Presence of prosthetic heart valve: Secondary | ICD-10-CM | POA: Diagnosis present

## 2024-07-07 DIAGNOSIS — Z8679 Personal history of other diseases of the circulatory system: Secondary | ICD-10-CM | POA: Insufficient documentation

## 2024-07-07 DIAGNOSIS — E875 Hyperkalemia: Secondary | ICD-10-CM | POA: Insufficient documentation

## 2024-07-07 DIAGNOSIS — Z85528 Personal history of other malignant neoplasm of kidney: Secondary | ICD-10-CM | POA: Diagnosis present

## 2024-07-07 DIAGNOSIS — I471 Supraventricular tachycardia, unspecified: Secondary | ICD-10-CM | POA: Diagnosis present

## 2024-07-07 DIAGNOSIS — I4891 Unspecified atrial fibrillation: Secondary | ICD-10-CM | POA: Diagnosis present

## 2024-07-07 DIAGNOSIS — I2510 Atherosclerotic heart disease of native coronary artery without angina pectoris: Secondary | ICD-10-CM | POA: Insufficient documentation

## 2024-07-07 DIAGNOSIS — E785 Hyperlipidemia, unspecified: Secondary | ICD-10-CM | POA: Diagnosis not present

## 2024-07-07 DIAGNOSIS — E876 Hypokalemia: Secondary | ICD-10-CM

## 2024-07-07 DIAGNOSIS — Z8673 Personal history of transient ischemic attack (TIA), and cerebral infarction without residual deficits: Secondary | ICD-10-CM | POA: Insufficient documentation

## 2024-07-07 DIAGNOSIS — I7300 Raynaud's syndrome without gangrene: Secondary | ICD-10-CM | POA: Insufficient documentation

## 2024-07-07 NOTE — Patient Instructions (Addendum)
 Medication Instructions:  Your physician recommends that you continue on your current medications as directed. Please refer to the Current Medication list given to you today.  Labwork: In 1-2 weeks at [facility]   Testing/Procedures: None   Follow-Up: Your physician recommends that you schedule a follow-up appointment in: 3-4 months   Any Other Special Instructions Will Be Listed Below (If Applicable).  If you need a refill on your cardiac medications before your next appointment, please call your pharmacy.   (Patient Name)-_____________________________  (MRN)-_________________________     (Physician)-_________________________________   (DATE) -_________ (Blood Pressure)-_________  (Heart Rate)-_________    (DATE) -_________ (Blood Pressure)-_________  (Heart Rate)-_________    (DATE) -_________ (Blood Pressure)-_________  (Heart Rate)-_________   (DATE) -_________ (Blood Pressure)-_________  (Heart Rate)-_________    (DATE) -_________ (Blood Pressure)-_________  (Heart Rate)-_________    (DATE) -_________ (Blood Pressure)-_________  (Heart Rate)-_________   (DATE) -_________ (Blood Pressure)-_________  (Heart Rate)-_________    (DATE) -_________ (Blood Pressure)-_________  (Heart Rate)-_________    (DATE) -_________ (Blood Pressure)-_________  (Heart Rate)-_________    (DATE) -_________ (Blood Pressure)-_________  (Heart Rate)-_________    (DATE) -_________ (Blood Pressure)-_________  (Heart Rate)-_________    (DATE) -_________ (Blood Pressure)-_________  (Heart Rate)-_________    (DATE) -_________ (Blood Pressure)-_________  (Heart Rate)-_________    (DATE) -_________ (Blood Pressure)-_________  (Heart Rate)-_________

## 2024-07-07 NOTE — Progress Notes (Addendum)
 Cardiology Office Note:   Date:  07/07/2024 ID:  [name], [name] 01/07/1956, MRN [id number] PCP:  Dow Longs, PA-C [facility] Providers Cardiologist:  Jayson Sierras, MD    Referring MD: Dow Longs, PA-C  CC: Here for follow-up  History of Present Illness:    [name] is a very delightful 68 y.o. male with a PMH of severe AS due to bicuspid aortic valve, s/p AVR and ascending aortic aneurysm repair on May 18, 2023, CAD, s/p NSTEMI in 2015, HTN, PSVT, HLD, hx of RCC, s/p partial right nephrectomy in 2013, who presents today for follow-up.    Had an NSTEMI in 2015, underwent cardiac cath that showed occlusion of RCA filled by collaterals and had unsuccessful PCI, medical management recommended.  History of renal cell carcinoma, s/p partial nephrectomy in 2013.   Intraoperative TEE with EF approximately 50%.  Later in the evening on July 3 was noted to have right hand weakness and rapid response team was called.  Noted to have A-fib with RVR was hypotensive.  Was started on amiodarone  infusion, cardiology was consulted.  Transition to amiodarone  200 mg twice daily.  CT of the head without evidence of acute infarct or hemorrhage, however MRI of the brain showed scattered small acute infarcts in left cerebellar hemisphere in both cerebral hemispheres that were likely embolic in etiology.  Neurology was consulted and was felt to be candidate for Coumadin  due to new aortic valve as well as postoperative A-fib.  No DVT was noted.  06/19/2023 - Today he presents for post AVR follow-up.  He states that he is doing well since leaving the hospital. Participating in home health therapy. Does have a deficit, particularly right upper extremity weakness since the stroke, states right leg weakness has been occurring for 2 years since before his stroke.  Has been having some issues with constipation and appetite.  Weight has been stable recently. Denies any chest pain, shortness of breath,  palpitations, syncope, presyncope, dizziness, orthopnea, PND, swelling or significant weight changes, acute bleeding, or claudication.  09/07/2023 - Doing well.  He is interested in consulting a neurosurgeon based on his recent symptoms. Continuing to work with therapy, still experiencing right sided weakness, but right side grip is improving per his report. Denies any chest pain, shortness of breath, palpitations, syncope, presyncope, dizziness, orthopnea, PND, swelling or significant weight changes, acute bleeding, or claudication.   02/09/2024 - Since I have last seen him, he has been following [facility]. Underwent peripheral vascular cath on 01/27/2024 due to bilateral lower extremity critical and ischemic loss of the toes. Findings revealed sluggish flow in bilateral lower extremities, etiology most likely included heart failure, small vessel disease and foot, vasospasm.  It was suggested that he would benefit from a vasodilator.  Was started on nifedipine , however could not tolerate as he had a questionable syncopal episode on this medicine.  Patient is not 100% sure that he had a syncopal episode.  Says he was sitting in a chair and felt funny surrounding episode.  He says his wife witnessed him passing out though.  No longer taking nifedipine  per his report. Denies any chest pain, shortness of breath, palpitations, syncope, orthopnea, PND, swelling or significant weight changes, acute bleeding, or claudication. Does admit to some dizziness at times.   03/11/2024 - Presents today for follow-up. Denies any recurrent dizziness or near syncopal/syncopal episodes. Says this has resolved after cutting back on caffeine intake. Says his legs look better in appearance, has been  getting Botox injections, does admit to leg tingling at times. Admits to burning sensation like after a workout along mid to right side of chest and also right shoulder, says this has been going on ever since his open heart surgery, says it is  intermittent and notices it now. Says he is wondering if he is overdoing it, says he does a lot of household work. However, he avoids heavy lifting because of his hernia. Wants to know when he can have surgery. Denies any shortness of breath, palpitations, syncope, presyncope, dizziness, orthopnea, PND, swelling or significant weight changes, or claudication. Admits to some scattered bruising.   03/31/2024 - Tells me he was unable to do the NST on Tuesday due to water outage that day. Says he continues to note burning in his chest, not as often as before. Wonders if this is possibly related to GERD or if it is coming from his heart. Does note occasional back spasms he believes is due to his mattress. Recently has been sleeping on couch instead of his bed and says his back has been feeling much better.  He is scheduled to see a doctor to evaluate his spine and back soon. Still very active. Does experience some cold sensations that he wonders is due to a circulation issue. Denies any shortness of breath, palpitations, syncope, presyncope, dizziness, orthopnea, PND, swelling or significant weight changes, acute bleeding, or claudication.   Underwent open repair hernia inguinal on 06/28/2024. He is here for follow-up.   07/07/2024 -doing well since hernia surgery.  He is closely being followed by [facility] team.  Did have some discoloration and swelling along area of surgery/genital area, surgical team is aware - told him this is common after surgery. Did notice recent episode of Raynauds and BLE discoloration after recently getting in a pool. Tells me he feels the best and is doing the best for the first time in a while per his report. Denies any chest pain, shortness of breath, palpitations, syncope, presyncope, dizziness, orthopnea, PND, swelling or significant weight changes, acute bleeding, or claudication.   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   ROS:   Please see the history of present  illness.    All other systems reviewed and are negative.  EKGs/Labs/Other Studies Reviewed:    The following studies were reviewed today:  EKG: EKG is not ordered today.      Cardiac monitor 02/2024:  ZIO AT reviewed.  13 days, 22 hours analyzed.   Predominant rhythm is sinus with heart rate ranging from 48 bpm up to 126 bpm and average heart rate 69 bpm. There were rare PACs including atrial couplets and triplets representing less than 1% total beats. There were occasional PVCs representing 4.1% total beats with otherwise rare ventricular couplets and triplets representing less than 1% total beats.  Also limited episodes of ventricular bigeminy and trigeminy.  There were 6 runs of NSVT, the longest of which lasted for 12.9 seconds (cannot exclude aberrantly conducted SVT). Multiple (39) episodes of PSVT were noted, the longest of which lasted for 24.8 seconds. No pauses or high degree heart block.  Abdominal aortogram 01/27/2024:  Impression: Sluggish flow in bilateral lower extremities.  Etiologies include heart failure, small vessel disease in the foot, vasospasm. Yannick would benefit from new echo to quantify his ejection fraction.  He would also benefit from a vasodilator.  He was started on nifedipine  last week, but had a syncopal episode yesterday.  Per Debby,  the syncopal episode occurred as he was discussing hernia surgery, which made him feel queasy.  He has had no other issues since starting the medication last Friday.   I plan to send this to Dr. Debera and Almarie Crate, NP, whom he sees for cardiology and his Primary Rocky Don NP.   I think Nivan would benefit from medication to help with lower extremity vasospasm as long as his Echo is okay. Due to blood pressure concerns, will defer further management to either his primary or cardiology.   First-line therapy is reducing exposure to cold, and eliminating ergot's such as caffeine which can cause vasospasm. From a  medication standpoint, nifedipine , which is a calcium  channel blocker, which can increase vasodilation in the fingers and toes. This can be prescribed in an extended release tablet.  I prescribed nifedipine , which he stated helped quite a bit.  He is currently off of this after the syncopal episode.  Can try a lower dose.   Should nifedipine  not work, amlodipine  has also proven to have some efficacy. Phosphodiesterase 5 inhibitors are second line therapy - Sildenafil, tadalafil, or vardenafil.   Should wounds worsen, the only therapy I could offer would be amputation.  I would defer this to podiatry at [facility].  ABI's 01/2024:  Summary:  Right: Resting right ankle-brachial index is within normal range.   Flatline PPG waveforms of the 1st-3rd digits with severely dampended  4th-5th digits.   Left: Resting left ankle-brachial index is within normal range.   Flatline PPG waveforms of the 1st-3rd digits with severely dampended  4th-5th digits.  Limited echo 08/26/2023:   1. Left ventricular ejection fraction, by estimation, is 55 to 60%. The  left ventricle has normal function. The left ventricle has no regional  wall motion abnormalities. There is mild left ventricular hypertrophy.  Left ventricular diastolic parameters  are consistent with Grade I diastolic dysfunction (impaired relaxation).  The average left ventricular global longitudinal strain is -18.2 %. The  global longitudinal strain is normal.   2. Right ventricular systolic function is mildly reduced. The right  ventricular size is mildly enlarged. There is normal pulmonary artery  systolic pressure.   3. There is a 23 mm Inspiris Resilia + replacement of ascending aorta  with Hemashield platinum graft valve present in the aortic position.   4. The inferior vena cava is normal in size with greater than 50%  respiratory variability, suggesting right atrial pressure of 3 mmHg.   5. Limited echo evaluate LV function,  evaluate pericardial effusion   6. No pericardial effusion.   Comparison(s): EF 45-50%. Small to moderate posterior pericardial  effusion. No evidence of cardiac tamponade.  Echo 06/2023:  1. Left ventricular ejection fraction, by estimation, is 45 to 50%. The  left ventricle has mildly decreased function. The left ventricle  demonstrates regional wall motion abnormalities (see scoring  diagram/findings for description). There is mild  concentric left ventricular hypertrophy. Left ventricular diastolic  parameters are consistent with Grade I diastolic dysfunction (impaired  relaxation).   2. Right ventricular systolic function is moderately reduced. The right  ventricular size is normal. There is normal pulmonary artery systolic  pressure. The estimated right ventricular systolic pressure is 25.7 mmHg.   3. A small to moderate pericardial effusion is present. The pericardial  effusion is posterior to the left ventricle. There is no evidence of  cardiac tamponade.   4. The mitral valve is grossly normal. Mild mitral valve regurgitation.   5.  The aortic valve has been repaired/replaced. Aortic valve  regurgitation is not visualized. There is a 23 mm Inspiris Resilia +  replacement of ascending aorta with Hemashield platinum graft valve  present in the aortic position. Procedure Date: 05/18/23.   Aortic valve mean gradient measures 9.0 mmHg.   6. The inferior vena cava is normal in size with greater than 50%  respiratory variability, suggesting right atrial pressure of 3 mmHg.   Comparison(s): Prior images reviewed side by side. LVEF mildly reduced in range of 45-50%. Bioprosthetic AVR in place with no aortic regurgitation and normal mean AV gradient of 9 mmHg. Small to moderate posterior pericardial effusion.  CT angio chest 12/2022: IMPRESSION: 1. Ascending thoracic aortic aneurysm measuring 53 x 50 mm in diameter. Recommend semi-annual imaging followup by CTA or MRA and referral to  cardiothoracic surgery if not already obtained. This recommendation follows 2010 ACCF/AHA/AATS/ACR/ASA/SCA/SCAI/SIR/STS/SVM Guidelines for the Diagnosis and Management of Patients With Thoracic Aortic Disease. Circulation. 2010; 121: Z733-z630. Aortic aneurysm NOS (ICD10-I71.9) 2. Thickening and calcification of the aortic valve. 3. Coronary artery and Aortic Atherosclerosis (ICD10-I70.0).  Echocardiogram on 11/14/2022:  1. Left ventricular ejection fraction, by estimation, is 55 to 60%. The  left ventricle has normal function. The left ventricle has no regional  wall motion abnormalities. There is mild left ventricular hypertrophy.  Left ventricular diastolic parameters  are consistent with Grade I diastolic dysfunction (impaired relaxation).  The average left ventricular global longitudinal strain is -19.0 %. The  global longitudinal strain is normal.   2. Right ventricular systolic function is normal. The right ventricular  size is normal. Tricuspid regurgitation signal is inadequate for assessing  PA pressure.   3. The mitral valve is normal in structure. Trivial mitral valve  regurgitation. No evidence of mitral stenosis.   4. The tricuspid valve is abnormal.   5. The aortic valve has an indeterminant number of cusps. Aortic valve  regurgitation is moderate. Severe aortic valve stenosis.   6. Limited visualization of the ascending aorta, the visualized parts are  at least moderately dilated. Consider additional imaging with CTA or MRA  to better evalute degree and extent of aortic aneurysm. SABRA Aortic  dilatation noted. There is mild  dilatation of the aortic root, measuring 42 mm. There is moderate  dilatation of the ascending aorta, measuring 48 mm.   7. The inferior vena cava is normal in size with greater than 50%  respiratory variability, suggesting right atrial pressure of 3 mmHg.  Physical Exam:    VS:  BP 128/68   Pulse 66   Ht 5' 8 (1.727 m)   Wt 169 lb 6.4 oz (76.8  kg)   SpO2 98%   BMI 25.76 kg/m     Wt Readings from Last 3 Encounters:  07/07/24 169 lb 6.4 oz (76.8 kg)  03/31/24 172 lb 12.8 oz (78.4 kg)  03/11/24 171 lb (77.6 kg)    GEN: Well nourished, well developed in no acute distress HEENT: Normal NECK: No JVD; No carotid bruits CARDIAC: S1/S2, RRR, no murmur, no rubs or gallops noted; 2+ peripheral pulses along upper extremities RESPIRATORY:  Clear to auscultation without rales, wheezing or rhonchi  MUSCULOSKELETAL:  No edema; No deformity  SKIN: Warm and dry, improved color of skin to BLE, +2 PT pulses NEUROLOGIC:  Alert and oriented x 3 PSYCHIATRIC:  Calm and pleasant  ASSESSMENT & PLAN:    In order of problems listed above:  1. Severe aortic stenosis, bicuspid aortic valve, s/p SAVR TTE  08/2023 showed normal AVR. No medication changes at this time. Discussed SBE prophylaxis. Continue to follow-up at the Coumadin  Clinic.   2. CAD, status post NSTEMI in 2015 Stable with no anginal symptoms. No indication for ischemic evaluation.   Continue aspirin , Lipitor , and Lopressor . Heart healthy diet and regular physical activity as tolerated encouraged.  ED precautions discussed.   3. Thoracic aortic aneurysm, s/p repair CTA of chest revealed ascending thoracic aortic aneurysm measuring 53 x 50 mm. Underwent ascending aortic aneurysm repair on May 18, 2023 along with SAVR. Doing well. Denies any concerning symptoms. Follow-up with cardiothoracic surgery as scheduled. Care and ED precautions discussed. Heart healthy diet encouraged.   4. Hyperlipidemia Past LDL 75. Continue atorvastatin . Heart healthy diet encouraged.   5. Hypertension Blood pressure today stable. Continue Lopressor .  Heart healthy diet encouraged. Care and ED precautions discussed.   6. PSVT, post-op A-fib Denies any palpitations.  Heart rate stable today.  See recent monitor report above. Continue Lopressor . Heart healthy diet encouraged.   7. History of RCC, status  post partial right nephrectomy in 2013 Denies any issues.  He follows up with nephrology annually.  Continue current medication regimen.  Continue to follow-up with nephrology.    8. Hx of CVA, Right side weakness Previous MRI of the brain showed scattered small acute infarcts in left cerebellar hemisphere in both cerebral hemispheres that were likely embolic in etiology.  Neurology was consulted and was felt to be candidate for Coumadin  due to new aortic valve as well as postoperative A-fib. Noted to have right upper extremity weakness after stroke, improving, right lower extremity weakness was there prior to stroke. Continue to follow with PCP.  9. Raynaud's phenomenon No critical wounds noted to either lower extremity. Circulation to lower extremities appears improved since last office visit after lifestyle changes - recent flair up noted after getting into the pool. I have previously consulted with Dr. Silver, [facility] regarding medical therapy.  Discussed that first-line therapy is reducing exposure to cold, eliminating triggers such as caffeine. Discussed this with patient today. He will log and monitor BP and let us  know his readings. If no improvement by next follow-up visit, plan to consider low-dose amlodipine .  Follow-up with [facility] and podiatry as scheduled. Care and ED precautions discussed.   10. Hyperkalemia Most recent K+ level earlier this month - 5.7. He is not on any potassium supplements. Will recheck a BMET.   Disposition: Care and ED precautions discussed.  Follow-up with Dr. Debera or APP in 3-4 months or sooner if anything changes.   Medication Adjustments/Labs and Tests Ordered: Current medicines are reviewed at length with the patient today.  Concerns regarding medicines are outlined above.  Orders Placed This Encounter  Procedures   Basic Metabolic Panel (BMET)   No orders of the defined types were placed in this encounter.   Patient Instructions  Medication  Instructions:  Your physician recommends that you continue on your current medications as directed. Please refer to the Current Medication list given to you today.  Labwork: In 1-2 weeks at [facility]   Testing/Procedures: None   Follow-Up: Your physician recommends that you schedule a follow-up appointment in: 3-4 months   Any Other Special Instructions Will Be Listed Below (If Applicable).  If you need a refill on your cardiac medications before your next appointment, please call your pharmacy.   (Patient Name)-_____________________________  (MRN)-_________________________     (Physician)-_________________________________   (DATE) -_________ (Blood Pressure)-_________  (Heart Rate)-_________    (  DATE) -_________ (Blood Pressure)-_________  (Heart Rate)-_________    (DATE) -_________ (Blood Pressure)-_________  (Heart Rate)-_________   (DATE) -_________ (Blood Pressure)-_________  (Heart Rate)-_________    (DATE) -_________ (Blood Pressure)-_________  (Heart Rate)-_________    (DATE) -_________ (Blood Pressure)-_________  (Heart Rate)-_________   (DATE) -_________ (Blood Pressure)-_________  (Heart Rate)-_________    (DATE) -_________ (Blood Pressure)-_________  (Heart Rate)-_________    (DATE) -_________ (Blood Pressure)-_________  (Heart Rate)-_________    (DATE) -_________ (Blood Pressure)-_________  (Heart Rate)-_________    (DATE) -_________ (Blood Pressure)-_________  (Heart Rate)-_________    (DATE) -_________ (Blood Pressure)-_________  (Heart Rate)-_________    (DATE) -_________ (Blood Pressure)-_________  (Heart Rate)-_________    (DATE) -_________ (Blood Pressure)-_________  (Heart Rate)-_________       Bonney Almarie Crate, NP

## 2024-07-08 ENCOUNTER — Other Ambulatory Visit: Payer: Self-pay | Admitting: Nurse Practitioner

## 2024-07-13 ENCOUNTER — Telehealth: Payer: Self-pay | Admitting: Nurse Practitioner

## 2024-07-13 MED ORDER — ATORVASTATIN CALCIUM 80 MG PO TABS: 80.0000 mg | ORAL_TABLET | Freq: Every day | ORAL | 3 refills | Status: AC

## 2024-07-13 NOTE — Telephone Encounter (Signed)
*  STAT* If patient is at the pharmacy, call can be transferred to refill team.   1. Which medications need to be refilled? (please list name of each medication and dose if known)  atorvastatin  (LIPITOR ) 80 MG tablet   2. Which pharmacy/location (including street and city if local pharmacy) is medication to be sent to? [id number] - [place], [place] - [address]  3. Do they need a 30 day or 90 day supply?  90 day supply

## 2024-07-13 NOTE — Telephone Encounter (Signed)
 Pt's medication was sent to pt's pharmacy as requested. Confirmation received.

## 2024-07-27 ENCOUNTER — Encounter

## 2024-07-27 ENCOUNTER — Ambulatory Visit

## 2024-07-27 ENCOUNTER — Ambulatory Visit: Attending: Cardiology | Admitting: *Deleted

## 2024-07-27 DIAGNOSIS — Z5181 Encounter for therapeutic drug level monitoring: Secondary | ICD-10-CM | POA: Diagnosis present

## 2024-07-27 DIAGNOSIS — Z952 Presence of prosthetic heart valve: Secondary | ICD-10-CM | POA: Insufficient documentation

## 2024-07-27 DIAGNOSIS — I639 Cerebral infarction, unspecified: Secondary | ICD-10-CM | POA: Insufficient documentation

## 2024-07-27 DIAGNOSIS — I480 Paroxysmal atrial fibrillation: Secondary | ICD-10-CM | POA: Insufficient documentation

## 2024-07-27 LAB — POCT INR: INR: 3.5 — AB (ref 2.0–3.0)

## 2024-07-27 NOTE — Patient Instructions (Signed)
 Hold warfarin tonight then resume 2 tablets daily Eat salad tonight Recheck INR in 2 wks

## 2024-07-27 NOTE — Progress Notes (Signed)
 INR 3.5; Please see anticoagulation encounter

## 2024-08-05 ENCOUNTER — Other Ambulatory Visit: Payer: Self-pay | Admitting: Cardiology

## 2024-08-10 ENCOUNTER — Ambulatory Visit: Attending: Cardiology | Admitting: *Deleted

## 2024-08-10 DIAGNOSIS — Z952 Presence of prosthetic heart valve: Secondary | ICD-10-CM | POA: Diagnosis present

## 2024-08-10 DIAGNOSIS — Z5181 Encounter for therapeutic drug level monitoring: Secondary | ICD-10-CM | POA: Insufficient documentation

## 2024-08-10 DIAGNOSIS — I480 Paroxysmal atrial fibrillation: Secondary | ICD-10-CM | POA: Insufficient documentation

## 2024-08-10 DIAGNOSIS — I639 Cerebral infarction, unspecified: Secondary | ICD-10-CM | POA: Insufficient documentation

## 2024-08-10 LAB — POCT INR: INR: 3 (ref 2.0–3.0)

## 2024-08-10 NOTE — Patient Instructions (Signed)
 Continue warfarin 2 tablets daily Be consistent with Vit K foods Recheck INR in 3wks

## 2024-08-10 NOTE — Progress Notes (Signed)
 INR 3.0; Please see anticoagulation encounter

## 2024-08-22 ENCOUNTER — Encounter: Payer: Self-pay | Admitting: Podiatry

## 2024-08-22 ENCOUNTER — Ambulatory Visit: Admitting: Podiatry

## 2024-08-22 DIAGNOSIS — I739 Peripheral vascular disease, unspecified: Secondary | ICD-10-CM

## 2024-08-22 DIAGNOSIS — M79674 Pain in right toe(s): Secondary | ICD-10-CM

## 2024-08-22 DIAGNOSIS — Z7901 Long term (current) use of anticoagulants: Secondary | ICD-10-CM

## 2024-08-22 DIAGNOSIS — B351 Tinea unguium: Secondary | ICD-10-CM | POA: Diagnosis not present

## 2024-08-22 DIAGNOSIS — M79675 Pain in left toe(s): Secondary | ICD-10-CM

## 2024-08-22 NOTE — Progress Notes (Signed)
        Subjective:  Patient ID: [name], male    DOB: 1956-06-13,  MRN: [id number]  [name] presents to clinic today for:  Chief Complaint  Patient presents with   [facility]     RFC Non diabetic toenail trim.   Patient notes nails are thick and elongated, causing pain in shoe gear when ambulating.  Has a bruise on the left third toe near the proximal nail fold. States he caught his toe between wooden floor boards at home.  Denies any bleeding/ cuts to the skin.  PCP is Dow Longs, PA-C.  Last seen around 06/07/24  Past Medical History:  Diagnosis Date   Arthritis    Coronary atherosclerosis of native coronary artery    a. NSTEMI (02/2014):  LHC (02/2014):  Mild disease in LAD and CFX; prox RCA occluded with R-R collats, dist RCA filled by L-R collats, inf HK, EF 55%, LVEDP 15 mmHg.  PCI:  Unsuccessful angioplasty of RCA (late presentation of inf MI) - tx medically.   Essential hypertension    GERD (gastroesophageal reflux disease)    Hyperlipidemia    NSTEMI (non-ST elevated myocardial infarction) (HCC) 03/11/14   Renal cell carcinoma of right kidney (HCC)    Partial nephrectomy in 2013    Allergies  Allergen Reactions   Alcohol Other (See Comments)    Pts skin gets red. Ex. Pt used alcohol based deodorant and got red things under arms. & Drinks alcohol notices from heart on up everything turns red.   Chlorthalidone      Dizziness and Syncopal Episode    Objective:  [name] is a pleasant 68 y.o. male in NAD. AAO x 3.  Vascular Examination: Patient has palpable DP pulse, absent PT pulse bilateral.  Delayed capillary refill bilateral toes.  Sparse digital hair bilateral.  Proximal to distal cooling WNL bilateral.    Dermatological Examination: Interspaces are clear with no open lesions noted bilateral.  Skin is shiny and atrophic bilateral.  Nails are 3-60mm thick, with yellowish/brown discoloration, subungual debris and distal onycholysis x10.  There is pain  with compression of nails x10.  There is some ecchymosis without edema to the distal left third toe just proximal to the eponychium.  No open lesions are noted.  There is slightly dusky discoloration to the proximal nail portion.  Patient qualifies for at-risk foot care because of PVD.  Assessment/Plan: 1. Pain due to onychomycosis of toenails of both feet   2. PVD (peripheral vascular disease)   3. Long term current use of anticoagulant     Mycotic nails x10 were sharply debrided with sterile nail nippers and power debriding burr to decrease bulk and length.  Plan follow-up 3 months   Dmarcus Decicco D. Monterio Bob, DPM, FACFAS [facility]     [address] [place], [place] [address]                Office [phone]  Fax [phone]

## 2024-08-31 ENCOUNTER — Ambulatory Visit: Attending: Cardiology | Admitting: *Deleted

## 2024-08-31 DIAGNOSIS — I639 Cerebral infarction, unspecified: Secondary | ICD-10-CM | POA: Insufficient documentation

## 2024-08-31 DIAGNOSIS — I480 Paroxysmal atrial fibrillation: Secondary | ICD-10-CM | POA: Diagnosis present

## 2024-08-31 DIAGNOSIS — Z952 Presence of prosthetic heart valve: Secondary | ICD-10-CM | POA: Insufficient documentation

## 2024-08-31 DIAGNOSIS — Z5181 Encounter for therapeutic drug level monitoring: Secondary | ICD-10-CM | POA: Diagnosis present

## 2024-08-31 LAB — POCT INR: INR: 2.6 (ref 2.0–3.0)

## 2024-08-31 NOTE — Patient Instructions (Signed)
 Continue warfarin 2 tablets daily Be consistent with Vit K foods Recheck INR in 4 wks

## 2024-08-31 NOTE — Progress Notes (Signed)
 INR 2.6; Please see anticoagulation encounter

## 2024-09-28 ENCOUNTER — Ambulatory Visit: Attending: Cardiology | Admitting: *Deleted

## 2024-09-28 DIAGNOSIS — Z5181 Encounter for therapeutic drug level monitoring: Secondary | ICD-10-CM | POA: Insufficient documentation

## 2024-09-28 DIAGNOSIS — I480 Paroxysmal atrial fibrillation: Secondary | ICD-10-CM | POA: Diagnosis present

## 2024-09-28 DIAGNOSIS — I639 Cerebral infarction, unspecified: Secondary | ICD-10-CM | POA: Diagnosis present

## 2024-09-28 DIAGNOSIS — Z952 Presence of prosthetic heart valve: Secondary | ICD-10-CM | POA: Diagnosis present

## 2024-09-28 LAB — POCT INR: INR: 4.7 — AB (ref 2.0–3.0)

## 2024-09-28 NOTE — Patient Instructions (Signed)
 Hold warfarin tonight and tomorrow night then resume 2 tablets daily Be consistent with Vit K foods Recheck INR in 2 wks

## 2024-09-28 NOTE — Progress Notes (Signed)
 INR-4.7; Please see anticoagulation encounter

## 2024-10-12 ENCOUNTER — Ambulatory Visit: Attending: Cardiology | Admitting: *Deleted

## 2024-10-12 DIAGNOSIS — Z5181 Encounter for therapeutic drug level monitoring: Secondary | ICD-10-CM | POA: Diagnosis present

## 2024-10-12 DIAGNOSIS — I480 Paroxysmal atrial fibrillation: Secondary | ICD-10-CM | POA: Insufficient documentation

## 2024-10-12 DIAGNOSIS — Z952 Presence of prosthetic heart valve: Secondary | ICD-10-CM | POA: Insufficient documentation

## 2024-10-12 DIAGNOSIS — I639 Cerebral infarction, unspecified: Secondary | ICD-10-CM | POA: Diagnosis present

## 2024-10-12 LAB — POCT INR: INR: 3 (ref 2.0–3.0)

## 2024-10-12 NOTE — Progress Notes (Signed)
 INR 3.0; Please see anticoagulation encounter

## 2024-10-12 NOTE — Patient Instructions (Signed)
 Continue warfarin 2 tablets daily Be consistent with Vit K foods Recheck INR in 4 wks

## 2024-10-20 ENCOUNTER — Encounter: Payer: Self-pay | Admitting: Neurology

## 2024-10-20 ENCOUNTER — Ambulatory Visit: Payer: Medicare Other | Admitting: Neurology

## 2024-10-20 VITALS — BP 155/78 | HR 49 | Resp 14 | Ht 68.0 in

## 2024-10-20 DIAGNOSIS — R208 Other disturbances of skin sensation: Secondary | ICD-10-CM

## 2024-10-20 DIAGNOSIS — M51369 Other intervertebral disc degeneration, lumbar region without mention of lumbar back pain or lower extremity pain: Secondary | ICD-10-CM

## 2024-10-20 DIAGNOSIS — G992 Myelopathy in diseases classified elsewhere: Secondary | ICD-10-CM

## 2024-10-20 DIAGNOSIS — R269 Unspecified abnormalities of gait and mobility: Secondary | ICD-10-CM | POA: Diagnosis not present

## 2024-10-20 DIAGNOSIS — M4802 Spinal stenosis, cervical region: Secondary | ICD-10-CM

## 2024-10-20 DIAGNOSIS — R292 Abnormal reflex: Secondary | ICD-10-CM

## 2024-10-20 MED ORDER — BACLOFEN 10 MG PO TABS: ORAL_TABLET | ORAL | 11 refills | Status: AC

## 2024-10-20 NOTE — Progress Notes (Signed)
 [facility]  PATIENT: [name] DOB: [birth year]  REFERRING DOCTOR OR PCP:  Rocky Don, PA-C SOURCE: Patient, notes from primary care, imaging reports, MRI images (brain and lumbar spine) personally reviewed.  _________________________________   HISTORICAL  CHIEF COMPLAINT:  Chief Complaint  Patient presents with   Follow-up    Rm10, alone,  pt here in regards to  Myelopathy, Hyperreflexia, Gait disturbance, Allodynia, Degeneration of intervertebral disc of lumbar region annual FOLLOW UP     HISTORY OF PRESENT ILLNESS:  [name], is a 68 y.o. man with cervical myelopathy:  UPDATE 10/20/2023 MRI of the cervical spine 02/15/2021 showed myelopathy at C4C5 and he underwent spine surgery 04/29/2021 (Dr. Cheryle).  He denied any permanent improvement in the arms.     Unfortunately, he continued to have difficulty with leg strength/gait.    He continues to experience weakness, predominantly in the right leg and he has a right foot drop and uses a cane.    He has tried PT but did not note benefit.  He tries to be active.   He drives.   He does some household chores though some are difficult.     He  notes right (dominant) hand has reduced use due to mild weakness and reduced RAM, coordination.  Writing is reduced.  Difficult to do some taks like peeling potatoes.    Baclofen  helps some at 6 pills a day.    He has difficulty raising the right leg up   He also has tingling and allodynia (touch is mildly tender) in the outer left leg in the thigh lower leg into the foot.       He has had urinary urgency, worse the last year.  He has urgency when he stands up and has rare incontinence   He also notes some constipation.      Vision is fine.     He also has LBP.   He saw [facility] and had spinal injections without benefit.  Due to leg pain was placed on an opiate Jolynn Mix; PCP)..     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  He had renal cell  carcinoma and is status post nephrectomy.  He is on Lipitor , losartan  and metoprolol .   Also on finasteride  for BPH.     From Consult 02/07/2021: He has a history of lumbar DJD and surgery who began to experience leg numbness and right leg weakness last summer.   Specifically noted reduced ability to lift the right leg.  This affected his gait.  When sitting down, he has trouble raising the right foot towards the left knee.  He notes altered sensation in the legs.  Additionally, he has chronic tingling/numbness in his right hand bit more recently has had tingling in the hand.  He had surgery in 1988.    He also has known cervical DJD.  He has had right hand numbness since the late 1990's and MRi at the time showed disc pathology but he opted not to do surgery.   He throws his lower back about once or twice a year but is better a couple days to a week later.    I personally reviewed the recent MRIs.  I discussed the results with him and his wife.  I believe the changes in the brain are much more consistent with mild age-related chronic microvascular ischemic change rather than MS.  Specifically, there is nothing in the brain that should explain  right leg weakness.  The MRI of the lumbar spine shows multilevel degenerative changes and there is some encroachment on nerve roots as detailed below.  Imaging: MRI cervical spine 12/18/2021 (postoperative) shows C4-C5 ACDF.  There is decompression at that level compared to the 02/14/2021 MRI there is no longer spinal stenosis.  The myelopathy signal changes are still present at C4-C5 though they do not appear to be any worse.  Adjacent level disease is noted.  At C3-C4, there has been slight progression of the mild spinal stenosis.  There is no myelopathic signal.  There could be left C4 nerve root compression at this level.  There is mild spinal stenosis at C5-C6 that persists but does not appear to be worse of note, there is prominent ligamenta flava hypertrophy on  the left but no abnormal spinal cord signal is at C5-C6.   MRI Cervical spine 02/14/2021 showed: 1.    T2 hyperintense signal within the spinal cord adjacent to C4-C5 involving bilateral gray matter is most consistent with compressive myelopathy.  At this level, there is mild anterolisthesis and other degenerative changes causing mild to moderate spinal stenosis.  There is foraminal narrowing but no nerve root compression. 2.    At C3-C4, there is borderline anterolisthesis due to degenerative changes causing mild spinal stenosis and foraminal narrowing but no nerve root compression. 3.    At C5-C6, there is mild spinal stenosis due to degenerative changes but no nerve root compression. 4.    At C6-C7, there is borderline spinal stenosis but no nerve root compression.  MRI thoracic spine showed   The spinal cord appears normal.     Minimal degenerative changes at T9-T10 and T11-T12 that do not lead to spinal stenosis or nerve root compression.  MRI of the brain 01/29/2021 shows scattered T2/FLAIR hyperintense foci, predominantly in the subcortical and deep white matter of the hemispheres with a few foci in the pons and one in the cerebellum.  Of note, none of the foci appears to be juxtacortical or in the callososeptal fibers.   MRI of the lumbar spine 01/29/2021 shows multilevel degenerative changes.  At L2-L3 there is moderate left lateral recess stenosis with some encroachment on the left L3 nerve root.  At L3-L4, there is no significant nerve root encroachment.  At L4-L5, there are degenerative changes causing mild spinal stenosis with moderately severe right foraminal and lateral recess stenosis and moderate left foraminal and lateral recess stenosis.  There is potential for right L4 and L5 nerve root compression.  At L5-S1, there is moderate right lateral recess stenosis that could affect the right S1 nerve root.   REVIEW OF SYSTEMS: Constitutional: No fevers, chills, sweats, or change in  appetite Eyes: No visual changes, double vision, eye pain Ear, nose and throat: No hearing loss, ear pain, nasal congestion, sore throat Cardiovascular: No chest pain, palpitations Respiratory:  No shortness of breath at rest or with exertion.   No wheezes GastrointestinaI: No nausea, vomiting, diarrhea, abdominal pain, fecal incontinence Genitourinary:  No dysuria, urinary retention or frequency.  No nocturia. Musculoskeletal: He notes crunching in his neck when he moves it at times  He has lower back pain.   Ntegumentary: No rash, pruritus, skin lesions Neurological: as above Psychiatric: No depression at this time.  No anxiety Endocrine: No palpitations, diaphoresis, change in appetite, change in weigh or increased thirst Hematologic/Lymphatic:  No anemia, purpura, petechiae. Allergic/Immunologic: No itchy/runny eyes, nasal congestion, recent allergic reactions, rashes  ALLERGIES: Allergies  Allergen  Reactions   Alcohol Other (See Comments)    Pts skin gets red. Ex. Pt used alcohol based deodorant and got red things under arms. & Drinks alcohol notices from heart on up everything turns red.   Chlorthalidone      Dizziness and Syncopal Episode    HOME MEDICATIONS:  Current Outpatient Medications:    oxyCODONE  (OXY IR/ROXICODONE ) 5 MG immediate release tablet, Take 5 mg by mouth 2 (two) times daily as needed., Disp: , Rfl:    amoxicillin  (AMOXIL ) 500 MG capsule, Take 4 capsules (2,000 mg total) by mouth as directed. 30-60 minutes before dental procedures (Patient not taking: Reported on 08/22/2024), Disp: 4 capsule, Rfl: 5   aspirin  EC 81 MG EC tablet, Take 1 tablet (81 mg total) by mouth daily., Disp: , Rfl:    atorvastatin  (LIPITOR ) 80 MG tablet, Take 1 tablet (80 mg total) by mouth daily., Disp: 90 tablet, Rfl: 3   baclofen  (LIORESAL ) 10 MG tablet, One or two po tid up to 6/day (Patient taking differently: Take 10 mg by mouth 2 (two) times daily. One or two po tid up to 6/day),  Disp: , Rfl:    finasteride  (PROSCAR ) 5 MG tablet, Take 1 tablet (5 mg total) by mouth daily., Disp: 30 tablet, Rfl: 0   metoprolol  tartrate (LOPRESSOR ) 25 MG tablet, TAKE 1 TABLET BY MOUTH TWICE A DAY, Disp: 180 tablet, Rfl: 3   warfarin (COUMADIN ) 2.5 MG tablet, TAKE 1 TO 2 TABLETS DAILY OR AS DIRECTED BY COUMADIN  CLINIC, Disp: 45 tablet, Rfl: 5  PAST MEDICAL HISTORY: Past Medical History:  Diagnosis Date   Arthritis    Coronary atherosclerosis of native coronary artery    a. NSTEMI (02/2014):  LHC (02/2014):  Mild disease in LAD and CFX; prox RCA occluded with R-R collats, dist RCA filled by L-R collats, inf HK, EF 55%, LVEDP 15 mmHg.  PCI:  Unsuccessful angioplasty of RCA (late presentation of inf MI) - tx medically.   Essential hypertension    GERD (gastroesophageal reflux disease)    Hyperlipidemia    NSTEMI (non-ST elevated myocardial infarction) (HCC) 03/11/14   Renal cell carcinoma of right kidney (HCC)    Partial nephrectomy in 2013    PAST SURGICAL HISTORY: Past Surgical History:  Procedure Laterality Date   ABDOMINAL AORTOGRAM W/LOWER EXTREMITY Bilateral 01/27/2024   Procedure: ABDOMINAL AORTOGRAM W/LOWER EXTREMITY;  Surgeon: Lanis Fonda BRAVO, MD;  Location: [facility] INVASIVE CV LAB;  Service: Cardiovascular;  Laterality: Bilateral;   AORTIC VALVE REPLACEMENT N/A 05/18/2023   Procedure: AORTIC VALVE REPLACEMENT (AVR) USING INSPIRIS RESILIA 23 MM AORTIC VALVE;  Surgeon: Lucas Dorise POUR, MD;  Location: [facility] OR;  Service: Open Heart Surgery;  Laterality: N/A;   BACK SURGERY     1989   CERVICAL DISC SURGERY  04/29/2021   COLONOSCOPY N/A 12/11/2020   Procedure: COLONOSCOPY;  Surgeon: Mavis Anes, MD;  Location: [facility];  Service: Gastroenterology;  Laterality: N/A;   LEFT HEART CATHETERIZATION WITH CORONARY ANGIOGRAM N/A 03/17/2014   Procedure: LEFT HEART CATHETERIZATION WITH CORONARY ANGIOGRAM;  Surgeon: Candyce GORMAN Reek, MD;  Location: [facility] CATH LAB;  Service: Cardiovascular;   Laterality: N/A;   POLYPECTOMY  12/11/2020   Procedure: POLYPECTOMY;  Surgeon: Mavis Anes, MD;  Location: [facility];  Service: Gastroenterology;;   REPLACEMENT ASCENDING AORTA N/A 05/18/2023   Procedure: REPLACEMENT OF ASCENDING AORTIC ANEURYSM USING A 30 X 10 MM HEMASHIELD PLATINUM GRAFT;  Surgeon: Lucas Dorise POUR, MD;  Location: [facility] OR;  Service: Open Heart  Surgery;  Laterality: N/A;  [facility] ARREST   RIGHT HEART CATH AND CORONARY ANGIOGRAPHY N/A 04/03/2023   Procedure: RIGHT HEART CATH AND CORONARY ANGIOGRAPHY;  Surgeon: Wonda Sharper, MD;  Location: [facility] INVASIVE CV LAB;  Service: Cardiovascular;  Laterality: N/A;   ROBOT ASSISTED LAPAROSCOPIC NEPHRECTOMY  01/14/2012   Procedure: ROBOTIC ASSISTED LAPAROSCOPIC NEPHRECTOMY;  Surgeon: Toribio Neysa Repine, MD;  Location: [facility] ORS;  Service: Urology;  Laterality: Right;  Robot Laparoscopic Right Partial Nephrectomy     TEE WITHOUT CARDIOVERSION N/A 05/18/2023   Procedure: TRANSESOPHAGEAL ECHOCARDIOGRAM;  Surgeon: Lucas Dorise POUR, MD;  Location: [facility] OR;  Service: Open Heart Surgery;  Laterality: N/A;   TONSILLECTOMY      FAMILY HISTORY: Family History  Problem Relation Age of Onset   Renal Disease Daughter    Hypertension Daughter     SOCIAL HISTORY:  Social History   Socioeconomic History   Marital status: Married    Spouse name: Not on file   Number of children: 1   Years of education: 14   Highest education level: Not on file  Occupational History   Occupation: Retired   Tobacco Use   Smoking status: Former    Current packs/day: 0.00    Types: Cigarettes    Quit date: 11/18/1979    Years since quitting: 44.9   Smokeless tobacco: Never  Vaping Use   Vaping status: Never Used  Substance and Sexual Activity   Alcohol use: No    Alcohol/week: 0.0 standard drinks of alcohol   Drug use: Yes    Frequency: 7.0 times per week    Types: Marijuana    Comment: every day   Sexual activity: Not on file  Other Topics Concern   Not  on file  Social History Narrative   Lives w/ wife   Caffeine use: 1 cup coffee every morning   Right handed   Social Drivers of Health   Financial Resource Strain: Not on file  Food Insecurity: No Food Insecurity (05/18/2023)   Hunger Vital Sign    Worried About Running Out of Food in the Last Year: Never true    Ran Out of Food in the Last Year: Never true  Transportation Needs: No Transportation Needs (05/18/2023)   PRAPARE - Administrator, Civil Service (Medical): No    Lack of Transportation (Non-Medical): No  Physical Activity: Not on file  Stress: Not on file  Social Connections: Not on file  Intimate Partner Violence: Not At Risk (05/18/2023)   Humiliation, Afraid, Rape, and Kick questionnaire    Fear of Current or Ex-Partner: No    Emotionally Abused: No    Physically Abused: No    Sexually Abused: No     PHYSICAL EXAM  Vitals:   10/20/24 1044 10/20/24 1049  BP: (!) 169/88 (!) 155/78  Pulse: (!) 49   Resp: 14   SpO2: 98%   Height: 5' 8 (1.727 m)       Body mass index is 25.76 kg/m.   General: The patient is well-developed and well-nourished and in no acute distress  HEENT:  Head is [place]/AT.  Sclera are anicteric.    Neck: The neck is nontender.  He has a well-healed scar from his surgery last year.  Range of motion appears to be fairly normal in the neck.  Cardiovascular: The heart has a regular rate and rhythm with a normal S1 and S2. There were no murmurs, gallops or rubs.    Skin: Extremities are without  rash or  edema.  Musculoskeletal: There is no tenderness in the back or legs.  Neurologic Exam  Mental status: The patient is alert and oriented x 3 at the time of the examination. The patient has apparent normal recent and remote memory, with an apparently normal attention span and concentration ability.   Speech is normal.  Cranial nerves: Extraocular movements are full.  Facial strength and sensation was normal.  No obvious hearing  deficits are noted.  Motor:  Muscle bulk is normal.   Tone is increased in the right leg much more than the left leg. Strength is  5 / 5 in arms and left leg but 4/5 in the right hip flexion and ankle/toe and 4+/5 elsewhere  Sensory: Sensory testing is intact to pinprick, soft touch and vibration sensation in the arms.  However, he has reduced vibration sensation in the right leg relative to the left.     Coordination: Cerebellar testing reveals good finger-nose-finger and heel-to-shin bilaterally.  Gait and station: Station is normal.   His gait is spastic (R>>L) and has right foot drop.  .Cannot tandem walk.   Romberg is negative.   Reflexes: Deep tendon reflexes are symmetric and normal in arms but increased in legs (spread at knees and 2 beats nonsustained clonus on right)      DIAGNOSTIC DATA (LABS, IMAGING, TESTING) - I reviewed patient records, labs, notes, testing and imaging myself where available.  Lab Results  Component Value Date   WBC 9.5 06/01/2023   HGB 14.6 01/27/2024   HCT 43.0 01/27/2024   MCV 94.2 06/01/2023   PLT 355 06/01/2023      Component Value Date/Time   NA 141 01/27/2024 0845   NA 141 03/23/2023 1236   K 4.5 01/27/2024 0845   CL 108 01/27/2024 0845   CO2 21 (L) 06/01/2023 0701   GLUCOSE 102 (H) 01/27/2024 0845   BUN 26 (H) 01/27/2024 0845   BUN 14 03/23/2023 1236   CREATININE 1.20 01/27/2024 0845   CREATININE 0.94 09/20/2020 0820   CALCIUM  8.7 (L) 06/01/2023 0701   PROT 5.4 (L) 05/26/2023 0544   PROT 6.7 12/04/2022 0856   ALBUMIN  2.9 (L) 05/26/2023 0544   ALBUMIN  4.5 12/04/2022 0856   AST 20 05/26/2023 0544   ALT 27 05/26/2023 0544   ALKPHOS 56 05/26/2023 0544   BILITOT 1.1 05/26/2023 0544   BILITOT 0.7 12/04/2022 0856   GFRNONAA >60 06/01/2023 0701   GFRAA >90 04/24/2014 2220   Lab Results  Component Value Date   CHOL 77 05/22/2023   HDL 31 (L) 05/22/2023   LDLCALC 28 05/22/2023   TRIG 88 05/22/2023   CHOLHDL 2.5 05/22/2023   Lab  Results  Component Value Date   HGBA1C 5.8 (H) 05/14/2023       ASSESSMENT AND PLAN  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 [facility])  Hyperreflexia  Gait disturbance  Allodynia  Degeneration of intervertebral disc of lumbar region, unspecified whether pain present  Due to cervical myelopathy, he continues to experience difficulty with gait and has spasticity in the right leg associated with weakness.    Continue Baclofen  up to 60 mg/day for right > left leg spasticity.  A refill was sent in Stay active and exercise He will  continue seeing his PCP for pain related issues   He has spoken to Dr. Burnetta in the past but surgery felt not tobe useful.   Return in 12 months or sooner if there are  new or worsening neurologic symptoms.  This visit is part of a comprehensive longitudinal care medical relationship regarding the patients primary diagnosis of myelopathy and related concerns.   Nox Talent A. Vear, MD, [facility] 10/20/2024, 10:58 AM Certified in Neurology, Clinical Neurophysiology, Sleep Medicine and Neuroimaging  [facility] [address], [address] [place], [place] [address] [phone]

## 2024-11-07 ENCOUNTER — Encounter: Payer: Self-pay | Admitting: Nurse Practitioner

## 2024-11-07 ENCOUNTER — Other Ambulatory Visit (HOSPITAL_COMMUNITY)
Admission: RE | Admit: 2024-11-07 | Discharge: 2024-11-07 | Disposition: A | Discharge status: Home / Self Care | Source: Ambulatory Visit | Attending: Nurse Practitioner | Admitting: Nurse Practitioner

## 2024-11-07 ENCOUNTER — Ambulatory Visit: Admitting: *Deleted

## 2024-11-07 ENCOUNTER — Ambulatory Visit: Payer: Self-pay | Admitting: Nurse Practitioner

## 2024-11-07 ENCOUNTER — Ambulatory Visit: Attending: Nurse Practitioner | Admitting: Nurse Practitioner

## 2024-11-07 VITALS — BP 160/99 | HR 67 | Ht 68.0 in | Wt 170.0 lb

## 2024-11-07 DIAGNOSIS — I480 Paroxysmal atrial fibrillation: Secondary | ICD-10-CM | POA: Insufficient documentation

## 2024-11-07 DIAGNOSIS — I10 Essential (primary) hypertension: Secondary | ICD-10-CM | POA: Insufficient documentation

## 2024-11-07 DIAGNOSIS — I252 Old myocardial infarction: Secondary | ICD-10-CM | POA: Diagnosis not present

## 2024-11-07 DIAGNOSIS — I69331 Monoplegia of upper limb following cerebral infarction affecting right dominant side: Secondary | ICD-10-CM | POA: Insufficient documentation

## 2024-11-07 DIAGNOSIS — I639 Cerebral infarction, unspecified: Secondary | ICD-10-CM | POA: Insufficient documentation

## 2024-11-07 DIAGNOSIS — Z9889 Other specified postprocedural states: Secondary | ICD-10-CM | POA: Insufficient documentation

## 2024-11-07 DIAGNOSIS — Z7901 Long term (current) use of anticoagulants: Secondary | ICD-10-CM | POA: Insufficient documentation

## 2024-11-07 DIAGNOSIS — E785 Hyperlipidemia, unspecified: Secondary | ICD-10-CM | POA: Insufficient documentation

## 2024-11-07 DIAGNOSIS — E875 Hyperkalemia: Secondary | ICD-10-CM | POA: Diagnosis not present

## 2024-11-07 DIAGNOSIS — I4891 Unspecified atrial fibrillation: Secondary | ICD-10-CM | POA: Insufficient documentation

## 2024-11-07 DIAGNOSIS — Z8673 Personal history of transient ischemic attack (TIA), and cerebral infarction without residual deficits: Secondary | ICD-10-CM | POA: Insufficient documentation

## 2024-11-07 DIAGNOSIS — Z85528 Personal history of other malignant neoplasm of kidney: Secondary | ICD-10-CM | POA: Insufficient documentation

## 2024-11-07 DIAGNOSIS — I2510 Atherosclerotic heart disease of native coronary artery without angina pectoris: Secondary | ICD-10-CM | POA: Insufficient documentation

## 2024-11-07 DIAGNOSIS — Z8679 Personal history of other diseases of the circulatory system: Secondary | ICD-10-CM | POA: Insufficient documentation

## 2024-11-07 DIAGNOSIS — I471 Supraventricular tachycardia, unspecified: Secondary | ICD-10-CM | POA: Insufficient documentation

## 2024-11-07 DIAGNOSIS — Z952 Presence of prosthetic heart valve: Secondary | ICD-10-CM | POA: Insufficient documentation

## 2024-11-07 DIAGNOSIS — I7300 Raynaud's syndrome without gangrene: Secondary | ICD-10-CM | POA: Insufficient documentation

## 2024-11-07 DIAGNOSIS — R531 Weakness: Secondary | ICD-10-CM | POA: Insufficient documentation

## 2024-11-07 DIAGNOSIS — I4729 Other ventricular tachycardia: Secondary | ICD-10-CM | POA: Diagnosis not present

## 2024-11-07 DIAGNOSIS — Z5181 Encounter for therapeutic drug level monitoring: Secondary | ICD-10-CM | POA: Insufficient documentation

## 2024-11-07 LAB — COMPREHENSIVE METABOLIC PANEL WITH GFR
ALT: 20 U/L (ref 0–44)
AST: 22 U/L (ref 15–41)
Albumin: 4.2 g/dL (ref 3.5–5.0)
Alkaline Phosphatase: 94 U/L (ref 38–126)
Anion gap: 5 (ref 5–15)
BUN: 16 mg/dL (ref 8–23)
CO2: 31 mmol/L (ref 22–32)
Calcium: 9.1 mg/dL (ref 8.9–10.3)
Chloride: 107 mmol/L (ref 98–111)
Creatinine, Ser: 0.94 mg/dL (ref 0.61–1.24)
GFR, Estimated: >60 mL/min
Glucose, Bld: 102 mg/dL — ABNORMAL HIGH (ref 70–99)
Potassium: 4.8 mmol/L (ref 3.5–5.1)
Sodium: 143 mmol/L (ref 135–145)
Total Bilirubin: 0.8 mg/dL (ref 0.0–1.2)
Total Protein: 6.6 g/dL (ref 6.5–8.1)

## 2024-11-07 LAB — POCT INR: INR: 2.3 (ref 2.0–3.0)

## 2024-11-07 MED ORDER — AMLODIPINE BESYLATE 2.5 MG PO TABS: 2.5000 mg | ORAL_TABLET | Freq: Every day | ORAL | 5 refills | Status: AC

## 2024-11-07 NOTE — Progress Notes (Unsigned)
 " Cardiology Office Note:   Date:  07/07/2024 ID:  Curtis, [name] Nov 18, 1955, MRN [id number] PCP:  Dow Longs, PA-C [facility] Providers Cardiologist:  Jayson Sierras, MD    Referring MD: Dow Longs, PA-C  CC: Here for follow-up  History of Present Illness:    [name] is a very delightful 68 y.o. male with a PMH of severe AS due to bicuspid aortic valve, s/p AVR and ascending aortic aneurysm repair on May 18, 2023, CAD, s/p NSTEMI in 2015, HTN, PSVT, HLD, hx of RCC, s/p partial right nephrectomy in 2013, who presents today for follow-up.    Had an NSTEMI in 2015, underwent cardiac cath that showed occlusion of RCA filled by collaterals and had unsuccessful PCI, medical management recommended.  History of renal cell carcinoma, s/p partial nephrectomy in 2013.   Intraoperative TEE with EF approximately 50%.  Later in the evening on July 3 was noted to have right hand weakness and rapid response team was called.  Noted to have A-fib with RVR was hypotensive.  Was started on amiodarone  infusion, cardiology was consulted.  Transition to amiodarone  200 mg twice daily.  CT of the head without evidence of acute infarct or hemorrhage, however MRI of the brain showed scattered small acute infarcts in left cerebellar hemisphere in both cerebral hemispheres that were likely embolic in etiology.  Neurology was consulted and was felt to be candidate for Coumadin  due to new aortic valve as well as postoperative A-fib.  No DVT was noted.  06/19/2023 - Today he presents for post AVR follow-up.  He states that he is doing well since leaving the hospital. Participating in home health therapy. Does have a deficit, particularly right upper extremity weakness since the stroke, states right leg weakness has been occurring for 2 years since before his stroke.  Has been having some issues with constipation and appetite.  Weight has been stable recently. Denies any chest pain, shortness of breath,  palpitations, syncope, presyncope, dizziness, orthopnea, PND, swelling or significant weight changes, acute bleeding, or claudication.  09/07/2023 - Doing well.  He is interested in consulting a neurosurgeon based on his recent symptoms. Continuing to work with therapy, still experiencing right sided weakness, but right side grip is improving per his report. Denies any chest pain, shortness of breath, palpitations, syncope, presyncope, dizziness, orthopnea, PND, swelling or significant weight changes, acute bleeding, or claudication.   02/09/2024 - Since I have last seen him, he has been following [facility]. Underwent peripheral vascular cath on 01/27/2024 due to bilateral lower extremity critical and ischemic loss of the toes. Findings revealed sluggish flow in bilateral lower extremities, etiology most likely included heart failure, small vessel disease and foot, vasospasm.  It was suggested that he would benefit from a vasodilator.  Was started on nifedipine , however could not tolerate as he had a questionable syncopal episode on this medicine.  Patient is not 100% sure that he had a syncopal episode.  Says he was sitting in a chair and felt funny surrounding episode.  He says his wife witnessed him passing out though.  No longer taking nifedipine  per his report. Denies any chest pain, shortness of breath, palpitations, syncope, orthopnea, PND, swelling or significant weight changes, acute bleeding, or claudication. Does admit to some dizziness at times.   03/11/2024 - Presents today for follow-up. Denies any recurrent dizziness or near syncopal/syncopal episodes. Says this has resolved after cutting back on caffeine intake. Says his legs look better in appearance, has  been getting Botox injections, does admit to leg tingling at times. Admits to burning sensation like after a workout along mid to right side of chest and also right shoulder, says this has been going on ever since his open heart surgery, says it is  intermittent and notices it now. Says he is wondering if he is overdoing it, says he does a lot of household work. However, he avoids heavy lifting because of his hernia. Wants to know when he can have surgery. Denies any shortness of breath, palpitations, syncope, presyncope, dizziness, orthopnea, PND, swelling or significant weight changes, or claudication. Admits to some scattered bruising.   03/31/2024 - Tells me he was unable to do the NST on Tuesday due to water outage that day. Says he continues to note burning in his chest, not as often as before. Wonders if this is possibly related to GERD or if it is coming from his heart. Does note occasional back spasms he believes is due to his mattress. Recently has been sleeping on couch instead of his bed and says his back has been feeling much better.  He is scheduled to see a doctor to evaluate his spine and back soon. Still very active. Does experience some cold sensations that he wonders is due to a circulation issue. Denies any shortness of breath, palpitations, syncope, presyncope, dizziness, orthopnea, PND, swelling or significant weight changes, acute bleeding, or claudication.   Underwent open repair hernia inguinal on 06/28/2024. He is here for follow-up.   07/07/2024 -doing well since hernia surgery.  He is closely being followed by [facility] team.  Did have some discoloration and swelling along area of surgery/genital area, surgical team is aware - told him this is common after surgery. Did notice recent episode of Raynauds and BLE discoloration after recently getting in a pool. Tells me he feels the best and is doing the best for the first time in a while per his report. Denies any chest pain, shortness of breath, palpitations, syncope, presyncope, dizziness, orthopnea, PND, swelling or significant weight changes, acute bleeding, or claudication.   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   ROS:   Please see the history of present  illness.    All other systems reviewed and are negative.  EKGs/Labs/Other Studies Reviewed:    The following studies were reviewed today:  EKG: EKG is not ordered today.      Cardiac monitor 02/2024:  ZIO AT reviewed.  13 days, 22 hours analyzed.   Predominant rhythm is sinus with heart rate ranging from 48 bpm up to 126 bpm and average heart rate 69 bpm. There were rare PACs including atrial couplets and triplets representing less than 1% total beats. There were occasional PVCs representing 4.1% total beats with otherwise rare ventricular couplets and triplets representing less than 1% total beats.  Also limited episodes of ventricular bigeminy and trigeminy.  There were 6 runs of NSVT, the longest of which lasted for 12.9 seconds (cannot exclude aberrantly conducted SVT). Multiple (39) episodes of PSVT were noted, the longest of which lasted for 24.8 seconds. No pauses or high degree heart block.  Abdominal aortogram 01/27/2024:  Impression: Sluggish flow in bilateral lower extremities.  Etiologies include heart failure, small vessel disease in the foot, vasospasm. Kairen would benefit from new echo to quantify his ejection fraction.  He would also benefit from a vasodilator.  He was started on nifedipine  last week, but had a syncopal episode yesterday.  Per  Johnnathan, the syncopal episode occurred as he was discussing hernia surgery, which made him feel queasy.  He has had no other issues since starting the medication last Friday.   I plan to send this to Dr. Debera and Almarie Crate, NP, whom he sees for cardiology and his Primary Rocky Don NP.   I think Markes would benefit from medication to help with lower extremity vasospasm as long as his Echo is okay. Due to blood pressure concerns, will defer further management to either his primary or cardiology.   First-line therapy is reducing exposure to cold, and eliminating ergot's such as caffeine which can cause vasospasm. From a  medication standpoint, nifedipine , which is a calcium  channel blocker, which can increase vasodilation in the fingers and toes. This can be prescribed in an extended release tablet.  I prescribed nifedipine , which he stated helped quite a bit.  He is currently off of this after the syncopal episode.  Can try a lower dose.   Should nifedipine  not work, amlodipine  has also proven to have some efficacy. Phosphodiesterase 5 inhibitors are second line therapy - Sildenafil, tadalafil, or vardenafil.   Should wounds worsen, the only therapy I could offer would be amputation.  I would defer this to podiatry at [facility].  ABI's 01/2024:  Summary:  Right: Resting right ankle-brachial index is within normal range.   Flatline PPG waveforms of the 1st-3rd digits with severely dampended  4th-5th digits.   Left: Resting left ankle-brachial index is within normal range.   Flatline PPG waveforms of the 1st-3rd digits with severely dampended  4th-5th digits.  Limited echo 08/26/2023:   1. Left ventricular ejection fraction, by estimation, is 55 to 60%. The  left ventricle has normal function. The left ventricle has no regional  wall motion abnormalities. There is mild left ventricular hypertrophy.  Left ventricular diastolic parameters  are consistent with Grade I diastolic dysfunction (impaired relaxation).  The average left ventricular global longitudinal strain is -18.2 %. The  global longitudinal strain is normal.   2. Right ventricular systolic function is mildly reduced. The right  ventricular size is mildly enlarged. There is normal pulmonary artery  systolic pressure.   3. There is a 23 mm Inspiris Resilia + replacement of ascending aorta  with Hemashield platinum graft valve present in the aortic position.   4. The inferior vena cava is normal in size with greater than 50%  respiratory variability, suggesting right atrial pressure of 3 mmHg.   5. Limited echo evaluate LV function,  evaluate pericardial effusion   6. No pericardial effusion.   Comparison(s): EF 45-50%. Small to moderate posterior pericardial  effusion. No evidence of cardiac tamponade.  Echo 06/2023:  1. Left ventricular ejection fraction, by estimation, is 45 to 50%. The  left ventricle has mildly decreased function. The left ventricle  demonstrates regional wall motion abnormalities (see scoring  diagram/findings for description). There is mild  concentric left ventricular hypertrophy. Left ventricular diastolic  parameters are consistent with Grade I diastolic dysfunction (impaired  relaxation).   2. Right ventricular systolic function is moderately reduced. The right  ventricular size is normal. There is normal pulmonary artery systolic  pressure. The estimated right ventricular systolic pressure is 25.7 mmHg.   3. A small to moderate pericardial effusion is present. The pericardial  effusion is posterior to the left ventricle. There is no evidence of  cardiac tamponade.   4. The mitral valve is grossly normal. Mild mitral valve regurgitation.  5. The aortic valve has been repaired/replaced. Aortic valve  regurgitation is not visualized. There is a 23 mm Inspiris Resilia +  replacement of ascending aorta with Hemashield platinum graft valve  present in the aortic position. Procedure Date: 05/18/23.   Aortic valve mean gradient measures 9.0 mmHg.   6. The inferior vena cava is normal in size with greater than 50%  respiratory variability, suggesting right atrial pressure of 3 mmHg.   Comparison(s): Prior images reviewed side by side. LVEF mildly reduced in range of 45-50%. Bioprosthetic AVR in place with no aortic regurgitation and normal mean AV gradient of 9 mmHg. Small to moderate posterior pericardial effusion.  CT angio chest 12/2022: IMPRESSION: 1. Ascending thoracic aortic aneurysm measuring 53 x 50 mm in diameter. Recommend semi-annual imaging followup by CTA or MRA and referral to  cardiothoracic surgery if not already obtained. This recommendation follows 2010 ACCF/AHA/AATS/ACR/ASA/SCA/SCAI/SIR/STS/SVM Guidelines for the Diagnosis and Management of Patients With Thoracic Aortic Disease. Circulation. 2010; 121: Z733-z630. Aortic aneurysm NOS (ICD10-I71.9) 2. Thickening and calcification of the aortic valve. 3. Coronary artery and Aortic Atherosclerosis (ICD10-I70.0).  Echocardiogram on 11/14/2022:  1. Left ventricular ejection fraction, by estimation, is 55 to 60%. The  left ventricle has normal function. The left ventricle has no regional  wall motion abnormalities. There is mild left ventricular hypertrophy.  Left ventricular diastolic parameters  are consistent with Grade I diastolic dysfunction (impaired relaxation).  The average left ventricular global longitudinal strain is -19.0 %. The  global longitudinal strain is normal.   2. Right ventricular systolic function is normal. The right ventricular  size is normal. Tricuspid regurgitation signal is inadequate for assessing  PA pressure.   3. The mitral valve is normal in structure. Trivial mitral valve  regurgitation. No evidence of mitral stenosis.   4. The tricuspid valve is abnormal.   5. The aortic valve has an indeterminant number of cusps. Aortic valve  regurgitation is moderate. Severe aortic valve stenosis.   6. Limited visualization of the ascending aorta, the visualized parts are  at least moderately dilated. Consider additional imaging with CTA or MRA  to better evalute degree and extent of aortic aneurysm. SABRA Aortic  dilatation noted. There is mild  dilatation of the aortic root, measuring 42 mm. There is moderate  dilatation of the ascending aorta, measuring 48 mm.   7. The inferior vena cava is normal in size with greater than 50%  respiratory variability, suggesting right atrial pressure of 3 mmHg.  Physical Exam:    VS:  BP (!) 148/100 (BP Location: Left Arm)   Pulse 67   Ht 5' 8 (1.727  m)   Wt 170 lb (77.1 kg)   SpO2 97%   BMI 25.85 kg/m     Wt Readings from Last 3 Encounters:  11/07/24 170 lb (77.1 kg)  07/07/24 169 lb 6.4 oz (76.8 kg)  03/31/24 172 lb 12.8 oz (78.4 kg)    GEN: Well nourished, well developed in no acute distress HEENT: Normal NECK: No JVD; No carotid bruits CARDIAC: S1/S2, RRR, no murmur, no rubs or gallops noted; 2+ peripheral pulses along upper extremities RESPIRATORY:  Clear to auscultation without rales, wheezing or rhonchi  MUSCULOSKELETAL:  No edema; No deformity  SKIN: Warm and dry, improved color of skin to BLE, +2 PT pulses NEUROLOGIC:  Alert and oriented x 3 PSYCHIATRIC:  Calm and pleasant  ASSESSMENT & PLAN:    In order of problems listed above:  1. Severe aortic stenosis, bicuspid aortic  valve, s/p SAVR TTE 08/2023 showed normal AVR. No medication changes at this time. Discussed SBE prophylaxis. Continue to follow-up at the [facility].   2. CAD, status post NSTEMI in 2015 Stable with no anginal symptoms. No indication for ischemic evaluation.   Continue aspirin , Lipitor , and Lopressor . Heart healthy diet and regular physical activity as tolerated encouraged.  ED precautions discussed.   3. Thoracic aortic aneurysm, s/p repair CTA of chest revealed ascending thoracic aortic aneurysm measuring 53 x 50 mm. Underwent ascending aortic aneurysm repair on May 18, 2023 along with SAVR. Doing well. Denies any concerning symptoms. Follow-up with cardiothoracic surgery as scheduled. Care and ED precautions discussed. Heart healthy diet encouraged.   4. Hyperlipidemia Past LDL 75. Continue atorvastatin . Heart healthy diet encouraged.   5. Hypertension Blood pressure today stable. Continue Lopressor .  Heart healthy diet encouraged. Care and ED precautions discussed.   6. PSVT, post-op A-fib Denies any palpitations.  Heart rate stable today.  See recent monitor report above. Continue Lopressor . Heart healthy diet encouraged.   7.  History of RCC, status post partial right nephrectomy in 2013 Denies any issues.  He follows up with nephrology annually.  Continue current medication regimen.  Continue to follow-up with nephrology.    8. Hx of CVA, Right side weakness Previous MRI of the brain showed scattered small acute infarcts in left cerebellar hemisphere in both cerebral hemispheres that were likely embolic in etiology.  Neurology was consulted and was felt to be candidate for Coumadin  due to new aortic valve as well as postoperative A-fib. Noted to have right upper extremity weakness after stroke, improving, right lower extremity weakness was there prior to stroke. Continue to follow with PCP.  9. Raynaud's phenomenon No critical wounds noted to either lower extremity. Circulation to lower extremities appears improved since last office visit after lifestyle changes - recent flair up noted after getting into the pool. I have previously consulted with Dr. Silver, [facility] regarding medical therapy.  Discussed that first-line therapy is reducing exposure to cold, eliminating triggers such as caffeine. Discussed this with patient today. He will log and monitor BP and let us  know his readings. If no improvement by next follow-up visit, plan to consider low-dose amlodipine .  Follow-up with [facility] and podiatry as scheduled. Care and ED precautions discussed.   10. Hyperkalemia Most recent K+ level earlier this month - 5.7. He is not on any potassium supplements. Will recheck a BMET.   Disposition: Care and ED precautions discussed.  Follow-up with Dr. Debera or APP in 3-4 months or sooner if anything changes.  CMET today at [facility] - STAT.   Start norvasc  2.5 mg daily. Raynaud's phenomonen.    Medication Adjustments/Labs and Tests Ordered: Current medicines are reviewed at length with the patient today.  Concerns regarding medicines are outlined above.  No orders of the defined types were placed in this encounter.  No orders of the  defined types were placed in this encounter.   There are no Patient Instructions on file for this visit.    Signed, Almarie Crate, NP  "

## 2024-11-07 NOTE — Patient Instructions (Signed)
 Continue warfarin 2 tablets daily Be consistent with Vit K foods Recheck INR in 4 wks

## 2024-11-07 NOTE — Progress Notes (Signed)
 INR-2.3; Please see anticoagulation encounter

## 2024-11-07 NOTE — Progress Notes (Unsigned)
 " Cardiology Office Note:   Date: 11/07/2024 ID:  [name] 17-Oct-1956, MRN [id number] PCP:  Dow Longs, PA-C [facility] Providers Cardiologist:  Jayson Sierras, MD    Referring MD: Dow Longs, PA-C  CC: Here for follow-up  History of Present Illness:    [name] is a very delightful 68 y.o. male with a PMH of severe AS due to bicuspid aortic valve, s/p AVR and ascending aortic aneurysm repair on May 18, 2023, CAD, s/p NSTEMI in 2015, HTN, PSVT, HLD, hx of RCC, s/p partial right nephrectomy in 2013, who presents today for follow-up.    Had an NSTEMI in 2015, underwent cardiac cath that showed occlusion of RCA filled by collaterals and had unsuccessful PCI, medical management recommended.  History of renal cell carcinoma, s/p partial nephrectomy in 2013.   Intraoperative TEE with EF approximately 50%.  Later in the evening on July 3 was noted to have right hand weakness and rapid response team was called.  Noted to have A-fib with RVR was hypotensive.  Was started on amiodarone  infusion, cardiology was consulted.  Transition to amiodarone  200 mg twice daily.  CT of the head without evidence of acute infarct or hemorrhage, however MRI of the brain showed scattered small acute infarcts in left cerebellar hemisphere in both cerebral hemispheres that were likely embolic in etiology.  Neurology was consulted and was felt to be candidate for Coumadin  due to new aortic valve as well as postoperative A-fib.  No DVT was noted.  06/19/2023 - Today he presents for post AVR follow-up.  He states that he is doing well since leaving the hospital. Participating in home health therapy. Does have a deficit, particularly right upper extremity weakness since the stroke, states right leg weakness has been occurring for 2 years since before his stroke.  Has been having some issues with constipation and appetite.  Weight has been stable recently. Denies any chest pain, shortness of breath,  palpitations, syncope, presyncope, dizziness, orthopnea, PND, swelling or significant weight changes, acute bleeding, or claudication.  09/07/2023 - Doing well.  He is interested in consulting a neurosurgeon based on his recent symptoms. Continuing to work with therapy, still experiencing right sided weakness, but right side grip is improving per his report. Denies any chest pain, shortness of breath, palpitations, syncope, presyncope, dizziness, orthopnea, PND, swelling or significant weight changes, acute bleeding, or claudication.   02/09/2024 - Since I have last seen him, he has been following [facility]. Underwent peripheral vascular cath on 01/27/2024 due to bilateral lower extremity critical and ischemic loss of the toes. Findings revealed sluggish flow in bilateral lower extremities, etiology most likely included heart failure, small vessel disease and foot, vasospasm.  It was suggested that he would benefit from a vasodilator.  Was started on nifedipine , however could not tolerate as he had a questionable syncopal episode on this medicine.  Patient is not 100% sure that he had a syncopal episode.  Says he was sitting in a chair and felt funny surrounding episode.  He says his wife witnessed him passing out though.  No longer taking nifedipine  per his report. Denies any chest pain, shortness of breath, palpitations, syncope, orthopnea, PND, swelling or significant weight changes, acute bleeding, or claudication. Does admit to some dizziness at times.   03/11/2024 - Presents today for follow-up. Denies any recurrent dizziness or near syncopal/syncopal episodes. Says this has resolved after cutting back on caffeine intake. Says his legs look better in appearance, has been  getting Botox injections, does admit to leg tingling at times. Admits to burning sensation like after a workout along mid to right side of chest and also right shoulder, says this has been going on ever since his open heart surgery, says it is  intermittent and notices it now. Says he is wondering if he is overdoing it, says he does a lot of household work. However, he avoids heavy lifting because of his hernia. Wants to know when he can have surgery. Denies any shortness of breath, palpitations, syncope, presyncope, dizziness, orthopnea, PND, swelling or significant weight changes, or claudication. Admits to some scattered bruising.   03/31/2024 - Tells me he was unable to do the NST on Tuesday due to water outage that day. Says he continues to note burning in his chest, not as often as before. Wonders if this is possibly related to GERD or if it is coming from his heart. Does note occasional back spasms he believes is due to his mattress. Recently has been sleeping on couch instead of his bed and says his back has been feeling much better.  He is scheduled to see a doctor to evaluate his spine and back soon. Still very active. Does experience some cold sensations that he wonders is due to a circulation issue. Denies any shortness of breath, palpitations, syncope, presyncope, dizziness, orthopnea, PND, swelling or significant weight changes, acute bleeding, or claudication.  Underwent open repair hernia inguinal on 06/28/2024. He is here for follow-up.   07/07/2024 -doing well since hernia surgery.  He is closely being followed by [facility] team.  Did have some discoloration and swelling along area of surgery/genital area, surgical team is aware - told him this is common after surgery. Did notice recent episode of Raynauds and BLE discoloration after recently getting in a pool. Tells me he feels the best and is doing the best for the first time in a while per his report. Denies any chest pain, shortness of breath, palpitations, syncope, presyncope, dizziness, orthopnea, PND, swelling or significant weight changes, acute bleeding, or claudication.  11/07/2024 - Here for scheduled follow-up.  Overall doing well.  Does admit to some discoloration along  his legs, improved from last office visit but still noticeable. Denies any chest pain, shortness of breath, palpitations, syncope, presyncope, dizziness, orthopnea, PND, swelling or significant weight changes, acute bleeding, or claudication.  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   ROS:   Please see the history of present illness.    All other systems reviewed and are negative.  EKGs/Labs/Other Studies Reviewed:    The following studies were reviewed today:  EKG: EKG is not ordered today.   Lexiscan  03/2024:      Findings are consistent with large prior inferior/inferoseptal infarction with mild peri-infarct ischemia. The study is low risk.   No ST deviation was noted.   Large moderate to severe intensity inferior/inferoseptal defect with mild reversibility.   Left ventricular function is normal. Nuclear stress EF: 57%. The left ventricular ejection fraction is normal (55-65%). End diastolic cavity size is normal.    Cardiac monitor 02/2024:  ZIO AT reviewed.  13 days, 22 hours analyzed.   Predominant rhythm is sinus with heart rate ranging from 48 bpm up to 126 bpm and average heart rate 69 bpm. There were rare PACs including atrial couplets and triplets representing less than 1% total beats. There were occasional PVCs representing 4.1% total beats with otherwise rare ventricular couplets and triplets representing  less than 1% total beats.  Also limited episodes of ventricular bigeminy and trigeminy.  There were 6 runs of NSVT, the longest of which lasted for 12.9 seconds (cannot exclude aberrantly conducted SVT). Multiple (39) episodes of PSVT were noted, the longest of which lasted for 24.8 seconds. No pauses or high degree heart block.  Abdominal aortogram 01/27/2024:  Impression: Sluggish flow in bilateral lower extremities.  Etiologies include heart failure, small vessel disease in the foot, vasospasm. Orell would benefit from new echo to quantify his ejection  fraction.  He would also benefit from a vasodilator.  He was started on nifedipine  last week, but had a syncopal episode yesterday.  Per Debby, the syncopal episode occurred as he was discussing hernia surgery, which made him feel queasy.  He has had no other issues since starting the medication last Friday.   I plan to send this to Dr. Debera and Almarie Crate, NP, whom he sees for cardiology and his Primary Rocky Don NP.   I think Kionte would benefit from medication to help with lower extremity vasospasm as long as his Echo is okay. Due to blood pressure concerns, will defer further management to either his primary or cardiology.   First-line therapy is reducing exposure to cold, and eliminating ergot's such as caffeine which can cause vasospasm. From a medication standpoint, nifedipine , which is a calcium  channel blocker, which can increase vasodilation in the fingers and toes. This can be prescribed in an extended release tablet.  I prescribed nifedipine , which he stated helped quite a bit.  He is currently off of this after the syncopal episode.  Can try a lower dose.   Should nifedipine  not work, amlodipine  has also proven to have some efficacy. Phosphodiesterase 5 inhibitors are second line therapy - Sildenafil, tadalafil, or vardenafil.   Should wounds worsen, the only therapy I could offer would be amputation.  I would defer this to podiatry at [facility].  ABI's 01/2024:  Summary:  Right: Resting right ankle-brachial index is within normal range.   Flatline PPG waveforms of the 1st-3rd digits with severely dampended  4th-5th digits.   Left: Resting left ankle-brachial index is within normal range.   Flatline PPG waveforms of the 1st-3rd digits with severely dampended  4th-5th digits.  Limited echo 08/26/2023:   1. Left ventricular ejection fraction, by estimation, is 55 to 60%. The  left ventricle has normal function. The left ventricle has no regional  wall  motion abnormalities. There is mild left ventricular hypertrophy.  Left ventricular diastolic parameters  are consistent with Grade I diastolic dysfunction (impaired relaxation).  The average left ventricular global longitudinal strain is -18.2 %. The  global longitudinal strain is normal.   2. Right ventricular systolic function is mildly reduced. The right  ventricular size is mildly enlarged. There is normal pulmonary artery  systolic pressure.   3. There is a 23 mm Inspiris Resilia + replacement of ascending aorta  with Hemashield platinum graft valve present in the aortic position.   4. The inferior vena cava is normal in size with greater than 50%  respiratory variability, suggesting right atrial pressure of 3 mmHg.   5. Limited echo evaluate LV function, evaluate pericardial effusion   6. No pericardial effusion.   Comparison(s): EF 45-50%. Small to moderate posterior pericardial  effusion. No evidence of cardiac tamponade.  Echo 06/2023:  1. Left ventricular ejection fraction, by estimation, is 45 to 50%. The  left ventricle has mildly decreased function. The  left ventricle  demonstrates regional wall motion abnormalities (see scoring  diagram/findings for description). There is mild  concentric left ventricular hypertrophy. Left ventricular diastolic  parameters are consistent with Grade I diastolic dysfunction (impaired  relaxation).   2. Right ventricular systolic function is moderately reduced. The right  ventricular size is normal. There is normal pulmonary artery systolic  pressure. The estimated right ventricular systolic pressure is 25.7 mmHg.   3. A small to moderate pericardial effusion is present. The pericardial  effusion is posterior to the left ventricle. There is no evidence of  cardiac tamponade.   4. The mitral valve is grossly normal. Mild mitral valve regurgitation.   5. The aortic valve has been repaired/replaced. Aortic valve  regurgitation is not  visualized. There is a 23 mm Inspiris Resilia +  replacement of ascending aorta with Hemashield platinum graft valve  present in the aortic position. Procedure Date: 05/18/23.   Aortic valve mean gradient measures 9.0 mmHg.   6. The inferior vena cava is normal in size with greater than 50%  respiratory variability, suggesting right atrial pressure of 3 mmHg.   Comparison(s): Prior images reviewed side by side. LVEF mildly reduced in range of 45-50%. Bioprosthetic AVR in place with no aortic regurgitation and normal mean AV gradient of 9 mmHg. Small to moderate posterior pericardial effusion.  CT angio chest 12/2022: IMPRESSION: 1. Ascending thoracic aortic aneurysm measuring 53 x 50 mm in diameter. Recommend semi-annual imaging followup by CTA or MRA and referral to cardiothoracic surgery if not already obtained. This recommendation follows 2010 ACCF/AHA/AATS/ACR/ASA/SCA/SCAI/SIR/STS/SVM Guidelines for the Diagnosis and Management of Patients With Thoracic Aortic Disease. Circulation. 2010; 121: Z733-z630. Aortic aneurysm NOS (ICD10-I71.9) 2. Thickening and calcification of the aortic valve. 3. Coronary artery and Aortic Atherosclerosis (ICD10-I70.0).  Echocardiogram on 11/14/2022:  1. Left ventricular ejection fraction, by estimation, is 55 to 60%. The  left ventricle has normal function. The left ventricle has no regional  wall motion abnormalities. There is mild left ventricular hypertrophy.  Left ventricular diastolic parameters  are consistent with Grade I diastolic dysfunction (impaired relaxation).  The average left ventricular global longitudinal strain is -19.0 %. The  global longitudinal strain is normal.   2. Right ventricular systolic function is normal. The right ventricular  size is normal. Tricuspid regurgitation signal is inadequate for assessing  PA pressure.   3. The mitral valve is normal in structure. Trivial mitral valve  regurgitation. No evidence of mitral  stenosis.   4. The tricuspid valve is abnormal.   5. The aortic valve has an indeterminant number of cusps. Aortic valve  regurgitation is moderate. Severe aortic valve stenosis.   6. Limited visualization of the ascending aorta, the visualized parts are  at least moderately dilated. Consider additional imaging with CTA or MRA  to better evalute degree and extent of aortic aneurysm. SABRA Aortic  dilatation noted. There is mild  dilatation of the aortic root, measuring 42 mm. There is moderate  dilatation of the ascending aorta, measuring 48 mm.   7. The inferior vena cava is normal in size with greater than 50%  respiratory variability, suggesting right atrial pressure of 3 mmHg.  Physical Exam:    VS:  BP (!) 160/99   Pulse 67   Ht 5' 8 (1.727 m)   Wt 170 lb (77.1 kg)   SpO2 97%   BMI 25.85 kg/m     Wt Readings from Last 3 Encounters:  11/07/24 170 lb (77.1 kg)  07/07/24 169 lb  6.4 oz (76.8 kg)  03/31/24 172 lb 12.8 oz (78.4 kg)    GEN: Well nourished, well developed in no acute distress HEENT: Normal NECK: No JVD; No carotid bruits CARDIAC: S1/S2, RRR, no murmur, no rubs or gallops noted; 2+ peripheral pulses along upper extremities RESPIRATORY:  Clear to auscultation without rales, wheezing or rhonchi  MUSCULOSKELETAL:  No edema; No deformity  SKIN: Warm and dry, improved color of skin to BLE, does have some dusky appearance to BLE, cool to touch, +2 PT pulses NEUROLOGIC:  Alert and oriented x 3 PSYCHIATRIC:  Calm and pleasant  ASSESSMENT & PLAN:    In order of problems listed above:  1. Severe aortic stenosis, bicuspid aortic valve, s/p SAVR TTE 08/2023 showed normal AVR. No medication changes at this time. Discussed SBE prophylaxis. Continue to follow-up at the Coumadin  Clinic.   2. CAD, status post NSTEMI in 2015 Stable with no anginal symptoms. No indication for ischemic evaluation.   Continue aspirin , Lipitor , and Lopressor . Heart healthy diet and regular physical  activity as tolerated encouraged.  ED precautions discussed.   3. Thoracic aortic aneurysm, s/p repair CTA of chest revealed ascending thoracic aortic aneurysm measuring 53 x 50 mm. Underwent ascending aortic aneurysm repair on May 18, 2023 along with SAVR. Doing well. Denies any concerning symptoms. Follow-up with cardiothoracic surgery as scheduled. Care and ED precautions discussed. Heart healthy diet encouraged.   4. Hyperlipidemia Past LDL 75. Continue atorvastatin . Heart healthy diet encouraged.  At next office visit, will request labs from PCP.  5. Hypertension Blood pressure elevated today and not at goal. Continue Lopressor  and will begin amlodipine  2.5 mg daily-see below.  Heart healthy diet encouraged. Care and ED precautions discussed.   6. PSVT, post-op A-fib Denies any palpitations.  Heart rate stable today.  See recent monitor report above. Continue Lopressor . Heart healthy diet encouraged.   7. History of RCC, status post partial right nephrectomy in 2013 Denies any issues.  He follows up with nephrology annually.  Continue current medication regimen.  Continue to follow-up with nephrology.    8. Hx of CVA, Right side weakness Previous MRI of the brain showed scattered small acute infarcts in left cerebellar hemisphere in both cerebral hemispheres that were likely embolic in etiology.  Neurology was consulted and was felt to be candidate for Coumadin  due to new aortic valve as well as postoperative A-fib. Noted to have right upper extremity weakness after stroke, improving, right lower extremity weakness was there prior to stroke. Continue to follow with PCP.  9. Raynaud's phenomenon No critical wounds noted to either lower extremity. Circulation to lower extremities appears improved since last office visit after lifestyle changes - recent flair up noted after getting into the pool. I have previously consulted with Dr. Silver, [facility] regarding medical therapy.  Discussed that  first-line therapy is reducing exposure to cold, eliminating triggers such as caffeine. Discussed this with patient today. He will log and monitor BP and let us  know his readings.  Previously consulted vascular surgeon who did recommend amlodipine  and because he did not have good response to nifedipine  will begin Norvasc  at 2.5 mg daily.  Follow-up with [facility] and podiatry as scheduled. Care and ED precautions discussed.   10. Hyperkalemia Most recent K+ level was 5.7. He is not on any potassium supplements. Will check a STAT CMET today.  Disposition: Care and ED precautions discussed.  Follow-up with Dr. Debera or APP in 3 months or sooner if anything changes.   Medication  Adjustments/Labs and Tests Ordered: Current medicines are reviewed at length with the patient today.  Concerns regarding medicines are outlined above.  Orders Placed This Encounter  Procedures   Comprehensive metabolic panel with GFR   Meds ordered this encounter  Medications   amLODipine  (NORVASC ) 2.5 MG tablet    Sig: Take 1 tablet (2.5 mg total) by mouth daily.    Dispense:  30 tablet    Refill:  5    11/07/2024-New    Patient Instructions  Medication Instructions:  Your physician has recommended you make the following change in your medication:  Start Amlodipine  2.5 mg once daily Continue taking all other medications as prescribed   Labwork: CMET to be completed today at [facility]  Testing/Procedures: None  Follow-Up: Your physician recommends that you schedule a follow-up appointment in: 3 months  Any Other Special Instructions Will Be Listed Below (If Applicable). Thank you for choosing [facility]!     If you need a refill on your cardiac medications before your next appointment, please call your pharmacy.     Signed, Almarie Crate, NP  "

## 2024-11-07 NOTE — Progress Notes (Signed)
 Labwork looks good. Continue same plan.   Thanks!  Best, Almarie Crate, NP

## 2024-11-07 NOTE — Patient Instructions (Addendum)
 Medication Instructions:  Your physician has recommended you make the following change in your medication:  Start Amlodipine  2.5 mg once daily Continue taking all other medications as prescribed   Labwork: CMET to be completed today at [facility] STAT  Testing/Procedures: None  Follow-Up: Your physician recommends that you schedule a follow-up appointment in: 3 months  Any Other Special Instructions Will Be Listed Below (If Applicable). Thank you for choosing [facility]!     If you need a refill on your cardiac medications before your next appointment, please call your pharmacy.

## 2024-11-08 ENCOUNTER — Ambulatory Visit

## 2024-11-22 ENCOUNTER — Ambulatory Visit: Admitting: Podiatry

## 2024-11-28 ENCOUNTER — Encounter: Payer: Self-pay | Admitting: Podiatry

## 2024-11-28 ENCOUNTER — Ambulatory Visit (INDEPENDENT_AMBULATORY_CARE_PROVIDER_SITE_OTHER): Admitting: Podiatry

## 2024-11-28 DIAGNOSIS — I739 Peripheral vascular disease, unspecified: Secondary | ICD-10-CM | POA: Diagnosis not present

## 2024-11-28 DIAGNOSIS — B351 Tinea unguium: Secondary | ICD-10-CM

## 2024-11-28 DIAGNOSIS — M79675 Pain in left toe(s): Secondary | ICD-10-CM | POA: Diagnosis not present

## 2024-11-28 DIAGNOSIS — M79674 Pain in right toe(s): Secondary | ICD-10-CM | POA: Diagnosis not present

## 2024-11-28 DIAGNOSIS — Z7901 Long term (current) use of anticoagulants: Secondary | ICD-10-CM

## 2024-11-28 NOTE — Progress Notes (Signed)
" ° °   °  °  Subjective:  Patient ID: [name], male    DOB: 01-Nov-1956,  MRN: [id number]  [name] presents to clinic today for:  Chief Complaint  Patient presents with   Nail Problem    Pt is here to have his nails trimmed    Patient notes nails are thick and elongated, causing pain in shoe gear when ambulating.  He does take long-term anticoagulation therapy due to history of open heart surgery and stroke in 2024.  Currently taking Coumadin .  PCP is Dow Longs, PA-C.  Last seen around 06/07/24  Past Medical History:  Diagnosis Date   Arthritis    Coronary atherosclerosis of native coronary artery    a. NSTEMI (02/2014):  LHC (02/2014):  Mild disease in LAD and CFX; prox RCA occluded with R-R collats, dist RCA filled by L-R collats, inf HK, EF 55%, LVEDP 15 mmHg.  PCI:  Unsuccessful angioplasty of RCA (late presentation of inf MI) - tx medically.   Essential hypertension    GERD (gastroesophageal reflux disease)    Hyperlipidemia    NSTEMI (non-ST elevated myocardial infarction) (HCC) 03/11/14   Renal cell carcinoma of right kidney (HCC)    Partial nephrectomy in 2013    Allergies  Allergen Reactions   Alcohol Other (See Comments)    Pts skin gets red. Ex. Pt used alcohol based deodorant and got red things under arms. & Drinks alcohol notices from heart on up everything turns red.   Chlorthalidone      Dizziness and Syncopal Episode    Objective:  [name] is a pleasant 69 y.o. male in NAD. AAO x 3.  Vascular Examination: Patient has palpable DP pulse, absent PT pulse bilateral.  Delayed capillary refill bilateral toes.  Sparse digital hair bilateral.  Proximal to distal cooling WNL bilateral.    Dermatological Examination: Interspaces are clear with no open lesions noted bilateral.  Skin is shiny and atrophic bilateral.  Nails are 3-88mm thick, with yellowish/brown discoloration, subungual debris and distal onycholysis x10.  There is pain with compression of  nails x10.  No open lesions are noted.    Musculoskeletal examination: Right sided weakness noted secondary to stroke.  Patient qualifies for at-risk foot care because of PVD.  Assessment/Plan: 1. Pain due to onychomycosis of toenails of both feet   2. PVD (peripheral vascular disease)   3. Long term current use of anticoagulant     #Onychomycosis with pain  -Nails palliatively debrided as below. -Educated on self-care -PVD with long term anticoagulation use on Warfarin  Procedure: Nail Debridement Rationale: Pain Type of Debridement: manual, sharp debridement. Instrumentation: Nail nipper, rotary burr. Number of Nails: 10  Return in about 3 months.   [name] DPM, AACFAS [facility]     [address] [place], [place] [address]                Office [phone]  Fax [phone]   "

## 2024-12-05 ENCOUNTER — Ambulatory Visit: Attending: Cardiology | Admitting: *Deleted

## 2024-12-05 DIAGNOSIS — I639 Cerebral infarction, unspecified: Secondary | ICD-10-CM | POA: Insufficient documentation

## 2024-12-05 DIAGNOSIS — I480 Paroxysmal atrial fibrillation: Secondary | ICD-10-CM | POA: Insufficient documentation

## 2024-12-05 DIAGNOSIS — Z5181 Encounter for therapeutic drug level monitoring: Secondary | ICD-10-CM | POA: Diagnosis present

## 2024-12-05 DIAGNOSIS — Z952 Presence of prosthetic heart valve: Secondary | ICD-10-CM | POA: Diagnosis present

## 2024-12-05 LAB — POCT INR: INR: 4.3 — AB (ref 2.0–3.0)

## 2024-12-05 NOTE — Progress Notes (Signed)
 INR 4.3; Please see anticoagulation encounter

## 2024-12-05 NOTE — Patient Instructions (Signed)
 Hold warfarin tonight then resume 2 tablets daily Eat extra Vit K food today Recheck INR in 2 wks

## 2024-12-19 ENCOUNTER — Ambulatory Visit

## 2024-12-21 ENCOUNTER — Ambulatory Visit

## 2024-12-21 DIAGNOSIS — I639 Cerebral infarction, unspecified: Secondary | ICD-10-CM

## 2024-12-21 DIAGNOSIS — Z952 Presence of prosthetic heart valve: Secondary | ICD-10-CM

## 2024-12-21 DIAGNOSIS — Z5181 Encounter for therapeutic drug level monitoring: Secondary | ICD-10-CM

## 2024-12-21 DIAGNOSIS — I480 Paroxysmal atrial fibrillation: Secondary | ICD-10-CM

## 2024-12-21 LAB — POCT INR: INR: 5 — AB (ref 2.0–3.0)

## 2024-12-21 NOTE — Progress Notes (Signed)
INR 5.0

## 2024-12-21 NOTE — Patient Instructions (Addendum)
 Hold warfarin tonight and tomorrow night then decrease dose to 2 tablets daily except 1 tablet on Tuesdays and Fridays Eat extra Vit K food today Recheck INR in 2 wks Pt denies signs of bleeding does have visible bruising on both arms.  Bleeding and fall precautions discussed with pt and he verbalized understanding.

## 2025-01-02 ENCOUNTER — Ambulatory Visit

## 2025-01-23 ENCOUNTER — Ambulatory Visit: Admitting: Nurse Practitioner

## 2025-02-27 ENCOUNTER — Ambulatory Visit: Admitting: Podiatry

## 2025-10-23 ENCOUNTER — Ambulatory Visit: Admitting: Adult Health
